# Patient Record
Sex: Male | Born: 1945
Health system: Southern US, Community
[De-identification: ages and names within clinical notes are randomized; demographics above are authoritative.]

## PROBLEM LIST (undated history)

## (undated) DIAGNOSIS — G8929 Other chronic pain: Secondary | ICD-10-CM

## (undated) DIAGNOSIS — E785 Hyperlipidemia, unspecified: Secondary | ICD-10-CM

## (undated) DIAGNOSIS — E039 Hypothyroidism, unspecified: Secondary | ICD-10-CM

## (undated) DIAGNOSIS — M545 Low back pain, unspecified: Secondary | ICD-10-CM

## (undated) DIAGNOSIS — C61 Malignant neoplasm of prostate: Secondary | ICD-10-CM

## (undated) DIAGNOSIS — G629 Polyneuropathy, unspecified: Secondary | ICD-10-CM

## (undated) DIAGNOSIS — I209 Angina pectoris, unspecified: Secondary | ICD-10-CM

## (undated) DIAGNOSIS — C679 Malignant neoplasm of bladder, unspecified: Secondary | ICD-10-CM

## (undated) DIAGNOSIS — I10 Essential (primary) hypertension: Secondary | ICD-10-CM

## (undated) DIAGNOSIS — Z8739 Personal history of other diseases of the musculoskeletal system and connective tissue: Secondary | ICD-10-CM

## (undated) DIAGNOSIS — M1990 Unspecified osteoarthritis, unspecified site: Secondary | ICD-10-CM

## (undated) DIAGNOSIS — K219 Gastro-esophageal reflux disease without esophagitis: Secondary | ICD-10-CM

## (undated) DIAGNOSIS — Z9989 Dependence on other enabling machines and devices: Secondary | ICD-10-CM

## (undated) DIAGNOSIS — D494 Neoplasm of unspecified behavior of bladder: Secondary | ICD-10-CM

## (undated) DIAGNOSIS — I2510 Atherosclerotic heart disease of native coronary artery without angina pectoris: Secondary | ICD-10-CM

## (undated) DIAGNOSIS — G4733 Obstructive sleep apnea (adult) (pediatric): Secondary | ICD-10-CM

## (undated) HISTORY — DX: Unspecified osteoarthritis, unspecified site: M19.90

## (undated) HISTORY — PX: ABDOMINAL HERNIA REPAIR: SHX539

## (undated) HISTORY — DX: Atherosclerotic heart disease of native coronary artery without angina pectoris: I25.10

## (undated) HISTORY — PX: HERNIA REPAIR: SHX51

## (undated) HISTORY — PX: CORONARY ANGIOPLASTY WITH STENT PLACEMENT: SHX49

## (undated) HISTORY — PX: ROBOT ASSISTED LAPAROSCOPIC RADICAL PROSTATECTOMY: SHX5141

## (undated) HISTORY — PX: KNEE CARTILAGE SURGERY: SHX688

## (undated) HISTORY — DX: Hypothyroidism, unspecified: E03.9

## (undated) HISTORY — DX: Hyperlipidemia, unspecified: E78.5

## (undated) HISTORY — DX: Essential (primary) hypertension: I10

## (undated) HISTORY — PX: BLADDER TUMOR EXCISION: SHX238

---

## 1995-01-12 HISTORY — PX: CORONARY ARTERY BYPASS GRAFT: SHX141

## 1998-08-12 ENCOUNTER — Encounter: Payer: Self-pay | Admitting: Emergency Medicine

## 1998-08-12 ENCOUNTER — Inpatient Hospital Stay (HOSPITAL_COMMUNITY): Admission: EM | Admit: 1998-08-12 | Discharge: 1998-08-14 | Payer: Self-pay | Admitting: Emergency Medicine

## 1998-09-16 ENCOUNTER — Inpatient Hospital Stay (HOSPITAL_COMMUNITY): Admission: AD | Admit: 1998-09-16 | Discharge: 1998-09-18 | Payer: Self-pay | Admitting: Cardiology

## 1998-09-18 ENCOUNTER — Encounter: Payer: Self-pay | Admitting: Cardiology

## 1999-01-21 ENCOUNTER — Ambulatory Visit (HOSPITAL_COMMUNITY): Admission: RE | Admit: 1999-01-21 | Discharge: 1999-01-21 | Payer: Self-pay | Admitting: Sports Medicine

## 1999-01-21 ENCOUNTER — Encounter: Payer: Self-pay | Admitting: Sports Medicine

## 2000-06-29 ENCOUNTER — Encounter: Payer: Self-pay | Admitting: Emergency Medicine

## 2000-06-29 ENCOUNTER — Inpatient Hospital Stay (HOSPITAL_COMMUNITY): Admission: EM | Admit: 2000-06-29 | Discharge: 2000-07-02 | Payer: Self-pay | Admitting: Internal Medicine

## 2000-07-19 ENCOUNTER — Encounter: Payer: Self-pay | Admitting: Cardiology

## 2000-07-19 ENCOUNTER — Inpatient Hospital Stay (HOSPITAL_COMMUNITY): Admission: EM | Admit: 2000-07-19 | Discharge: 2000-07-21 | Payer: Self-pay

## 2000-08-23 ENCOUNTER — Other Ambulatory Visit: Admission: RE | Admit: 2000-08-23 | Discharge: 2000-08-23 | Payer: Self-pay | Admitting: Urology

## 2000-08-26 ENCOUNTER — Encounter: Payer: Self-pay | Admitting: Urology

## 2000-08-26 ENCOUNTER — Ambulatory Visit (HOSPITAL_COMMUNITY): Admission: RE | Admit: 2000-08-26 | Discharge: 2000-08-26 | Payer: Self-pay | Admitting: Urology

## 2000-09-05 ENCOUNTER — Encounter: Payer: Self-pay | Admitting: Emergency Medicine

## 2000-09-05 ENCOUNTER — Inpatient Hospital Stay (HOSPITAL_COMMUNITY): Admission: EM | Admit: 2000-09-05 | Discharge: 2000-09-07 | Payer: Self-pay | Admitting: Emergency Medicine

## 2000-09-15 ENCOUNTER — Ambulatory Visit (HOSPITAL_COMMUNITY): Admission: RE | Admit: 2000-09-15 | Discharge: 2000-09-15 | Payer: Self-pay | Admitting: Urology

## 2000-09-15 ENCOUNTER — Encounter: Payer: Self-pay | Admitting: Urology

## 2000-11-08 ENCOUNTER — Encounter: Payer: Self-pay | Admitting: Emergency Medicine

## 2000-11-08 ENCOUNTER — Inpatient Hospital Stay (HOSPITAL_COMMUNITY): Admission: EM | Admit: 2000-11-08 | Discharge: 2000-11-11 | Payer: Self-pay | Admitting: Cardiology

## 2002-09-27 ENCOUNTER — Ambulatory Visit (HOSPITAL_COMMUNITY): Admission: RE | Admit: 2002-09-27 | Discharge: 2002-09-27 | Payer: Self-pay | Admitting: Internal Medicine

## 2003-10-14 ENCOUNTER — Encounter: Payer: Self-pay | Admitting: *Deleted

## 2003-10-15 ENCOUNTER — Inpatient Hospital Stay (HOSPITAL_COMMUNITY): Admission: EM | Admit: 2003-10-15 | Discharge: 2003-10-18 | Payer: Self-pay | Admitting: Cardiology

## 2004-03-12 ENCOUNTER — Observation Stay (HOSPITAL_COMMUNITY): Admission: EM | Admit: 2004-03-12 | Discharge: 2004-03-13 | Payer: Self-pay | Admitting: Emergency Medicine

## 2004-03-13 ENCOUNTER — Ambulatory Visit: Payer: Self-pay | Admitting: Cardiology

## 2005-06-22 ENCOUNTER — Ambulatory Visit: Payer: Self-pay | Admitting: Cardiology

## 2005-12-15 ENCOUNTER — Ambulatory Visit: Payer: Self-pay | Admitting: Cardiology

## 2005-12-24 ENCOUNTER — Ambulatory Visit: Payer: Self-pay

## 2006-01-17 ENCOUNTER — Ambulatory Visit: Payer: Self-pay | Admitting: Cardiology

## 2006-01-19 ENCOUNTER — Ambulatory Visit (HOSPITAL_COMMUNITY): Admission: RE | Admit: 2006-01-19 | Discharge: 2006-01-19 | Payer: Self-pay | Admitting: Urology

## 2006-01-27 ENCOUNTER — Encounter (HOSPITAL_COMMUNITY): Admission: RE | Admit: 2006-01-27 | Discharge: 2006-02-26 | Payer: Self-pay | Admitting: Orthopedic Surgery

## 2006-02-23 ENCOUNTER — Inpatient Hospital Stay (HOSPITAL_COMMUNITY): Admission: RE | Admit: 2006-02-23 | Discharge: 2006-02-25 | Payer: Self-pay | Admitting: Urology

## 2006-02-23 ENCOUNTER — Encounter (INDEPENDENT_AMBULATORY_CARE_PROVIDER_SITE_OTHER): Payer: Self-pay | Admitting: Specialist

## 2006-03-03 ENCOUNTER — Ambulatory Visit: Payer: Self-pay | Admitting: Cardiology

## 2006-06-28 ENCOUNTER — Ambulatory Visit: Payer: Self-pay | Admitting: Cardiology

## 2006-11-04 ENCOUNTER — Ambulatory Visit: Payer: Self-pay | Admitting: Cardiology

## 2006-11-04 ENCOUNTER — Observation Stay (HOSPITAL_COMMUNITY): Admission: AD | Admit: 2006-11-04 | Discharge: 2006-11-08 | Payer: Self-pay | Admitting: Cardiology

## 2006-12-01 ENCOUNTER — Ambulatory Visit: Payer: Self-pay | Admitting: Cardiology

## 2007-02-10 ENCOUNTER — Ambulatory Visit: Payer: Self-pay | Admitting: Cardiology

## 2007-09-12 ENCOUNTER — Ambulatory Visit: Payer: Self-pay | Admitting: Cardiology

## 2008-04-09 ENCOUNTER — Ambulatory Visit (HOSPITAL_COMMUNITY): Admission: RE | Admit: 2008-04-09 | Discharge: 2008-04-09 | Payer: Self-pay | Admitting: Family Medicine

## 2008-09-10 ENCOUNTER — Ambulatory Visit (HOSPITAL_COMMUNITY): Admission: RE | Admit: 2008-09-10 | Discharge: 2008-09-10 | Payer: Self-pay | Admitting: Family Medicine

## 2008-09-28 DIAGNOSIS — E785 Hyperlipidemia, unspecified: Secondary | ICD-10-CM | POA: Insufficient documentation

## 2008-09-28 DIAGNOSIS — M1990 Unspecified osteoarthritis, unspecified site: Secondary | ICD-10-CM | POA: Insufficient documentation

## 2008-09-28 DIAGNOSIS — I25810 Atherosclerosis of coronary artery bypass graft(s) without angina pectoris: Secondary | ICD-10-CM | POA: Insufficient documentation

## 2008-09-28 DIAGNOSIS — M549 Dorsalgia, unspecified: Secondary | ICD-10-CM | POA: Insufficient documentation

## 2008-09-28 DIAGNOSIS — E039 Hypothyroidism, unspecified: Secondary | ICD-10-CM | POA: Insufficient documentation

## 2008-09-28 DIAGNOSIS — I10 Essential (primary) hypertension: Secondary | ICD-10-CM | POA: Insufficient documentation

## 2008-10-17 ENCOUNTER — Ambulatory Visit: Payer: Self-pay | Admitting: Cardiology

## 2008-10-30 ENCOUNTER — Encounter (INDEPENDENT_AMBULATORY_CARE_PROVIDER_SITE_OTHER): Payer: Self-pay | Admitting: *Deleted

## 2009-10-27 ENCOUNTER — Ambulatory Visit: Payer: Self-pay | Admitting: Cardiology

## 2009-10-27 ENCOUNTER — Encounter: Payer: Self-pay | Admitting: Cardiology

## 2010-01-14 ENCOUNTER — Telehealth (INDEPENDENT_AMBULATORY_CARE_PROVIDER_SITE_OTHER): Payer: Self-pay | Admitting: *Deleted

## 2010-01-16 ENCOUNTER — Telehealth: Payer: Self-pay | Admitting: Cardiology

## 2010-02-12 NOTE — Assessment & Plan Note (Signed)
Summary: f1y   Visit Type:  Follow-up Primary Nakeysha Pasqual:  Phillips Odor  CC:  No complaints.  History of Present Illness: He seems to be doing well.  He has had chest pain one time, and it went away with NTG, but is was very feint.  He used to get this all the time, but now it is down to nothing.  Had his cholesterol check with Dr. Phillips Odor.  His arthritis is better than it was.  It has helped his knees.  BP have been a little high, and his weight is coming down.    Current Medications (verified): 1)  Norvasc 5 Mg Tabs (Amlodipine Besylate) .... Take 1/2 Tablet Daily 2)  Coq10 100 Mg Caps (Coenzyme Q10) .... Take 1 Capsule By Mouth Once A Day 3)  Celebrex 200 Mg Caps (Celecoxib) .... Take 1 Capsule By Mouth Once A Day 4)  Acetaminophen 500 Mg Tabs (Acetaminophen) .... As Needed 5)  Fish Oil 1200 Mg Caps (Omega-3 Fatty Acids) .... Take 1 Capsule By Mouth Three Times A Day 6)  Zetia 10 Mg Tabs (Ezetimibe) .... Take One Tablet By Mouth Daily. 7)  Welchol 625 Mg Tabs (Colesevelam Hcl) .... About 3 Times Per Week 8)  Plavix 75 Mg Tabs (Clopidogrel Bisulfate) .... Take One Tablet By Mouth Daily 9)  Lisinopril 5 Mg Tabs (Lisinopril) .... Take One Tablet By Mouth Daily 10)  Niaspan 500 Mg Cr-Tabs (Niacin (Antihyperlipidemic)) .... Take 1 Tablet By Mouth Once A Day 11)  Levothyroxine Sodium 25 Mcg Tabs (Levothyroxine Sodium) .... Take 1 Tablet By Mouth Once A Day 12)  Fiber Diet  Tabs (Fiber) .... At Least One A Day Sometimes Twice A Day 13)  Aspirin 81 Mg Tbec (Aspirin) .... Take One Tablet By Mouth Daily 14)  Multivitamins  Tabs (Multiple Vitamin) .... Take 1 Tablet By Mouth Once A Day 15)  Metoprolol Tartrate 25 Mg Tabs (Metoprolol Tartrate) .... Take One Tablet By Mouth Twice A Day  Allergies (verified): 1)  ! Codeine  Past History:  Past Medical History: Last updated: 2008/10/15 Current Problems:  CAD, ARTERY BYPASS GRAFT (ICD-414.04) HYPERTENSION, UNSPECIFIED (ICD-401.9) HYPERLIPIDEMIA  (ICD-272.4) HYPOTHYROIDISM (ICD-244.9) DEGENERATIVE JOINT DISEASE (ICD-715.90) BACK PAIN, CHRONIC (ICD-724.5)  Past Surgical History: Last updated: 15-Oct-2008   Coronary artery bypass grafting in 1997 with (LIMA)  arthroscopic surgery of the knees.   Hernia repair.   Radical prostatectomy with robotic device.      Family History: Last updated: 2008/10/15  Mother died at 65 of a CVA.  Father died at 71 of cancer.  He does not  know his siblings' history.   Social History: Last updated: 10-15-2008  The patient is married x2.  He has a remote tobacco   history.  He is semi-retired, working about 2 hours a day.      Vital Signs:  Patient profile:   65 year old male Height:      74 inches Weight:      229 pounds BMI:     29.51 Pulse rate:   76 / minute BP sitting:   148 / 88  (left arm) Cuff size:   large  Vitals Entered By: Hardin Negus, RMA (October 27, 2009 10:02 AM)  Physical Exam  General:  Well developed, well nourished, in no acute distress. Head:  normocephalic and atraumatic Eyes:  PERRLA/EOM intact; conjunctiva and lids normal. Neck:  Neck supple, no JVD. No masses, thyromegaly or abnormal cervical nodes. Lungs:  Clear bilaterally to auscultation and percussion. Heart:  PMI non displaced.  Normal S1 and S2.  No murmur or rub.  No gallop. Abdomen:  Bowel sounds positive; abdomen soft and non-tender without masses, organomegaly, or hernias noted. No hepatosplenomegaly. Pulses:  pulses normal in all 4 extremities Extremities:  No clubbing or cyanosis. Neurologic:  Alert and oriented x 3.   EKG  Procedure date:  10/27/2009  Findings:      NSR.  Incomplete RBBB.  Nonspecific ST abnormality.  Impression & Recommendations:  Problem # 1:  CAD, ARTERY BYPASS GRAFT (ICD-414.04) Stable at this point.  Feels great.  No chest pain of significance.  Talked about weight loss regarding metabolic standpoint, and also from a BP standpoint.   He acknowledges.  He has  improved diet and is exercising regularly. His updated medication list for this problem includes:    Norvasc 5 Mg Tabs (Amlodipine besylate) .Marland Kitchen... Take 1/2 tablet daily    Plavix 75 Mg Tabs (Clopidogrel bisulfate) .Marland Kitchen... Take one tablet by mouth daily    Lisinopril 5 Mg Tabs (Lisinopril) .Marland Kitchen... Take one tablet by mouth daily    Aspirin 81 Mg Tbec (Aspirin) .Marland Kitchen... Take one tablet by mouth daily    Metoprolol Tartrate 25 Mg Tabs (Metoprolol tartrate) .Marland Kitchen... Take one tablet by mouth twice a day  Orders: EKG w/ Interpretation (93000)  Problem # 2:  HYPERTENSION, UNSPECIFIED (ICD-401.9) Borderline control.  Weight loss should help.  If not improved, may need adjustment His updated medication list for this problem includes:    Norvasc 5 Mg Tabs (Amlodipine besylate) .Marland Kitchen... Take 1/2 tablet daily    Lisinopril 5 Mg Tabs (Lisinopril) .Marland Kitchen... Take one tablet by mouth daily    Aspirin 81 Mg Tbec (Aspirin) .Marland Kitchen... Take one tablet by mouth daily    Metoprolol Tartrate 25 Mg Tabs (Metoprolol tartrate) .Marland Kitchen... Take one tablet by mouth twice a day  Problem # 3:  HYPERLIPIDEMIA (ICD-272.4) managed by Dr. Phillips Odor. His updated medication list for this problem includes:    Zetia 10 Mg Tabs (Ezetimibe) .Marland Kitchen... Take one tablet by mouth daily.    Welchol 625 Mg Tabs (Colesevelam hcl) .Marland Kitchen... About 3 times per week    Niaspan 500 Mg Cr-tabs (Niacin (antihyperlipidemic)) .Marland Kitchen... Take 1 tablet by mouth once a day  Patient Instructions: 1)  Your physician recommends that you continue on your current medications as directed. Please refer to the Current Medication list given to you today. 2)  Your physician wants you to follow-up in:  1 YEAR.  You will receive a reminder letter in the mail two months in advance. If you don't receive a letter, please call our office to schedule the follow-up appointment.

## 2010-02-12 NOTE — Progress Notes (Signed)
Summary: rx change  Phone Note Call from Patient Call back at Home Phone [phone]   Caller: Patient Reason for Call: Talk to Nurse Summary of Call:  pt takes [facility] on tablet a day. pt needs his rx to be called in to [facility] # [phone] Initial call taken by: Roe Coombs,  January 16, 2010 2:32 PM  Follow-up for Phone Call        I spoke with the pt and he had previously taken Amlodipine 5mg  in the past but this was decreased by Dr Nobie Putnam due to hypotension.  The pt has been monitoring his BP at home and on average it is running 140/80.  The pt said he increased his Amlodipine back to 5mg  on his own.  The pt is also working on losing weight for better BP control.  The pt is now seeing Dr Phillips Odor for Primary Care. The pt would like a 3 month supply with refills sent to pharmacy. I instructed the pt to keep a BP log and if his BP continues running around 140/80 he should make an appt with Dr Phillips Odor. Pt agrees with plan.    Follow-up by: Julieta Gutting, RN, BSN,  January 16, 2010 2:58 PM  Additional Follow-up for Phone Call Additional follow up Details #1::        Per Dr Riley Kill the pt can have a Rx for Amlodipine 5mg  daily #90 with 2 refills.  I attempted to call this into the pharmacy but they are closed.  I left a message for the pt to make him aware that I will not be able to call in this Rx until Monday.  If the pt does not have any medicine for the weekend I instructed him to call our answering service.  Julieta Gutting, RN, BSN  January 16, 2010 7:06 PM  Rx for Amlodipine 5mg  called into the pharmacy.  Left message for pt to make him aware.    Additional Follow-up by: Julieta Gutting, RN, BSN,  January 19, 2010 10:35 AM    New/Updated Medications: AMLODIPINE BESYLATE 5 MG TABS (AMLODIPINE BESYLATE) Take one tablet by mouth daily

## 2010-02-12 NOTE — Progress Notes (Signed)
Summary: refill  Phone Note Refill Request Message from:  Patient on January 14, 2010 12:51 PM  Refills Requested: Medication #1:  NORVASC 5 MG TABS take 1/2 tablet daily need refill but change 5mg   and take 1 pill not a 1/2 send to [facility] [phone]  Initial call taken by: Judie Grieve,  January 14, 2010 12:52 PM  Follow-up for Phone Call        Rx called to pharmacy. Vikki Ports  January 14, 2010 3:45 PM     Prescriptions: NORVASC 5 MG TABS (AMLODIPINE BESYLATE) take 1/2 tablet daily  #45 Tablet x 6   Entered by:   Vikki Ports   Authorized by:   Ronaldo Miyamoto, MD, [facility]   Signed by:   Vikki Ports on 01/14/2010   Method used:   Faxed to ...       [facility] [address] (retail)       [address]       [place], [place]  [address]       Ph: [phone]       Fax: [phone]   RxID:   [id number]

## 2010-05-26 NOTE — Cardiovascular Report (Signed)
NAME:  [name], [name] NO.:  [id number]   MEDICAL RECORD NO.:  [id number]          PATIENT TYPE:  INP   LOCATION:  2019                         FACILITY:  [facility]   PHYSICIAN:  Steve Morton. Riley Kill, MD, FACCDATE OF BIRTH:  Jan 02, 1946   DATE OF PROCEDURE:  11/08/2006  DATE OF DISCHARGE:                            CARDIAC CATHETERIZATION   INDICATIONS:  [name] is a delightful 65 year old gentleman who  presents with some recurrent chest discomfort.  He has had prior bypass  involving a left internal mammary to the LAD and a free RIMA to the  right coronary artery.  Multiple stents have been placed in the  circumflex coronary system, the last being in 2005 at which time the  left main was crossed into the ramus intermedius.  That was treated with  a Cypher stent.  He has also got bifurcational disease more distally.  Because of the recurrent symptoms, he was brought to the catheterization  laboratory for further evaluation and treatment.   PROCEDURE:  1. Left heart catheterization.  2. Selective coronary arteriography.  3. Selective left ventriculography.   DESCRIPTION OF THE PROCEDURE:  The patient was brought to the  catheterization laboratory and prepped and draped in the usual fashion.  Through an anterior puncture the right femoral artery was easily  entered.  A 5-French sheath was then placed.  Following this, views were  obtained of the left and right coronary arteries, the free internal  mammary to the RCA, and the internal mammary to the left anterior  descending artery.  Following this, central aortic and left ventricular  pressures were measured with a pigtail.  Ventriculography was performed  in the RAO projection.  There were no complications and the patient was  taken to the holding area in satisfactory clinical condition.   HEMODYNAMIC DATA:  1. Central aortic pressure 126/69, mean 82.  2. Left ventricular pressure 124/18.  3. There was no  gradient on pullback across the aortic valve.   ANGIOGRAPHIC DATA:  1. Ventriculography done in the RAO projection reveals preserved      global systolic function.  2. The left main coronary artery tapers distally.  There is about 40-      50% area of narrowing distally as it trifurcates into an LAD, a      large intermediate, and AV circumflex.  This is at the location of      a previously placed Cypher stent which telescopes into the proximal      intermediate.  This is a 3-year result.  There is about 40-50%      narrowing and smooth narrowing.  The LAD itself is segmentally      plaqued and then basically subtotally occluded going into the      distal vessel.  The intermediate vessel has been extensively      stented.  Beyond the 40-50% area there is a long 30% area of      narrowing in the previously placed stent.  There is a second stent      distally which crosses over a bifurcation point.  At the  bifurcation there is about 40% narrowing in the main channel and      then in the side channel, which has also been dilated, there is      about 50-60% narrowing but it branches relatively small in caliber.      This also does appear to be smooth.  Just distal to the very      proximal stent in the left main, there is an AV circumflex which      also comes off.  This AV circumflex has about 50% proximal      narrowing.  There are two stents placed distally in this branch as      well.  Both stents are widely patent and there is good flow into      the small distal posterolateral system.  3. The right coronary artery is diffusely diseased proximally with 90%      narrowing and then total occlusion.  4. The internal mammary free graft to the distal right coronary artery      appears to be intact.  It goes into the distal right coronary which      has about 30% narrowing prior to the takeoff of the PDA.  The PDA      and PLA are both small in caliber but widely patent.  5. The left  internal mammary to the distal left anterior descending      artery is also widely patent with vigorous flow into the distal      LAD.  There is an area of smooth narrowing of probably 30-40%      distal to the mammary insertion and there is some mild plaquing      distally.   CONCLUSIONS:  1. Preserved left ventricular function.  2. Patent left internal mammary to the left anterior descending artery      with some mild distal disease.  3. Patent free right internal mammary artery into the right coronary      artery with mild distal disease.  4. A 40-50% narrowing at the left main site of previous Cypher      stenting location but really quite smooth with continued patency of      the multiple stents in the circumflex coronary artery.   DISPOSITION:  At the present time, the proximal lesion appears  relatively smooth.  I would be somewhat reluctant to dilate this.  It is  a 3-year result in a drug-eluting platform, and is likely not to  progress too much.  We may elect to proceed with some functional testing  and I will defer intervention unless the patient has specific symptoms.      Steve Morton. Riley Kill, MD, [facility]  Electronically Signed     TDS/MEDQ  D:  11/08/2006  T:  11/08/2006  Job:  [id number]   cc:   CV Laboratory

## 2010-05-26 NOTE — H&P (Signed)
NAME:  [name], [name] NO.:  [id number]   MEDICAL RECORD NO.:  [id number]          PATIENT TYPE:  INP   LOCATION:  2019                         FACILITY:  [facility]   PHYSICIAN:  Steve Morton. Riley Kill, MD, FACCDATE OF BIRTH:  [birth year]   DATE OF ADMISSION:  11/04/2006  DATE OF DISCHARGE:                              HISTORY & PHYSICAL   CHIEF COMPLAINT:  Chest pain.   HISTORY OF PRESENT ILLNESS:  [name] is in for a followup visit.  To briefly summarize, he has had some recurrent episodes of chest pain.  Approximately 1 week ago, he was awakened from sleep with severe  substernal chest pain; he took 3 nitroglycerins and it eventually went  away.  He has had some mild off-and-on pain ever since.  It has not been  truly progressive and he has been able to get out and exercise at times,  although it has never completely gone away as well.  He was quite  concerned and I talked with him by phone last night.  He came into the  office today for followup.  After a thorough discussion, given the  weekend coming up, we decided that it would be best to keep him under  observation with plans for cardiac catheterization and he is therefore  admitted.  The patient has had prior revascularization surgery in 1997,  and subsequently has had stenting of the circumflex coronary artery.   ALLERGIES:  Include CODEINE.   MEDICATION LIST:  1. Levothyroxine 25 mcg daily.  2. Niaspan 500 mg daily.  3. Multivitamin daily.  4. Fish oil b.i.d.  5. Plavix 75 mg daily.  6. Toprol-XL 50 mg daily.  7. Zetia 10 mg daily.  8. Welchol 625 mg two tablets.  9. Lisinopril 5 mg daily.  10.Aspirin 81 mg daily.  11.Norvasc 5 mg one-half tablet daily.  12.FiberCon every other day.  13.Glucosamine 1500 mg three a day.  14.CoQ-10 100 mg three a day.   PAST MEDICAL HISTORY:  1. Coronary bypass graft surgery in 1997 with a left internal mammary      to the LAD and a right internal mammary to the  right coronary      artery.  He has had subsequent percutaneous intervention with a      stent placement in a large intermediate branch and then subsequent      balloon dilatation for in-stent restenosis and then      atrioventricular groove cutting balloon angioplasty with repeat      intervention in 2002 and followup catheterization in 2005      demonstrating continued patency.  2. Radical prostatectomy with robotic device.  3. Degenerative joint disease.  4. Prior arthroscopic surgery of the knees.  5. Hernia repair.  6. Hyperlipidemia.   SOCIAL HISTORY:  The patient is married x2.  He has a remote tobacco  history.  He is semi-retired, working about 2 hours a day.   Mother died at 67 of a CVA.  Father died at 64 of cancer.  He does not  know his siblings' history.   REVIEW OF SYSTEMS:  He  does have a little bit of decreased hearing.  He  wears glasses.  He has had no bleeding in the urine or bowel.  His  weight has been stable.  There have been no fever or chills.  Constitutionally, he has been well without rash.  The complete review of  systems is otherwise negative.   EXAMINATION:  GENERAL:  He is alert and oriented, in no acute distress.  VITAL SIGNS:  Blood pressure was 136/82 and the pulse was 56.  NECK:  Carotid upstrokes were brisk.  HEENT:  Examination was unremarkable.  CHEST:  The lung fields were clear to auscultation and percussion.  CARDIAC:  The PMI was nondisplaced.  There is a normal first and second  heart sound without murmurs, rub or gallops.  ABDOMEN:  Soft without  obvious hepatosplenomegaly.  EXTREMITIES:  Intact.  Pulses are intact.   ELECTROCARDIOGRAM:  The electrocardiogram is done and reveals sinus  rhythm with occasional premature ventricular contraction.  There is  incomplete right bundle branch block.  Compared to the previous tracing,  no interval change is noted.   IMPRESSION:  1. Recurrent coronary symptoms worrisome for unstable angina  pectoris.  2. Prior coronary bypass graft surgery in 1997.  3. Prior percutaneous intervention of the circumflex system in      multiple locations.  4. Hyperlipidemia.  5. Other problems as noted.   PLAN:  We discussed the various options.  The patient has had some off-  and-on discomfort since his episode a week ago; based on this, we will  go ahead and admit him and plan for a repeat cardiac catheterization on  Monday; he is agreeable to this plan.  Intravenous heparin will be  started.      Steve Morton. Riley Kill, MD, [facility]  Electronically Signed     TDS/MEDQ  D:  11/04/2006  T:  11/06/2006  Job:  [id number]

## 2010-05-26 NOTE — Letter (Signed)
February 10, 2007    Patrica Duel, M.D.  [address]  [place], [place] [address]   RE:  [name], [name]  MRN:  [id number]  /  DOB:  1945-12-25   Dear Loraine Leriche:   I had the pleasure of seeing [name] in the office today in a  followup visit.  Clinically, he is doing reasonably well.  He has been  in the gym and working on the elliptical without any chest pain.  He has  had some tingling in the toes on the right, and as you know, he has had  some discomfort in the foot thought to be related to tendonitis for  which he has been on Celebrex.  Otherwise, cardiac-wise, he seems to be  getting along well.  I know you have been managing his lipids.   Today on physical, blood pressure of 144/76, pulse 58.  The lung fields  are clear to auscultation and percussion.  The PMI is nondisplaced.  The  cardiac rhythm is regular.  No significant murmurs are noted.   Electrocardiogram demonstrates normal sinus rhythm and essentially  reveals an incomplete right bundle branch block.   Clinically, he seems to be getting along well from a cardiac standpoint.  He has got excellent pulses in the feet and good capillary refill.  I am  not quite sure what causes the tingling in the toes, perhaps it is  related to the tendonitis.  As you know, he recently underwent cardiac  catheterization demonstrating preserved left ventricular function with  patent internal mammary to the LAD, a patent free RIMA to the right  coronary artery with mild distal disease and a 40-50% narrowing of the  left main site of the previous Cypher stent, but with smooth appearance  and good flow.   We plan to see him back in followup in about six months' time.  Hopefully, he will continue to do well.  Do not ever hesitate to call us  regarding his management.  I would be more than happy to see him at any  time.    Sincerely,      [name]. Steve Kill, MD, [facility]  Electronically Signed    TDS/MedQ  DD:  02/10/2007  DT: 02/10/2007  Job #: [id number]

## 2010-05-26 NOTE — Letter (Signed)
September 12, 2007    Patrica Duel, MD  [address]  [place], [place] [address].   RE:  RICHAR, DUNKLEE  MRN:  [id number]  /  DOB:  1945/10/06   Dear Loraine Leriche,   I had the pleasure of seeing Mr. Degraaf in the office today in  followup visit.  Cardiacwise, he seems to be getting along reasonably  well.  He denies any ongoing chest pain or progressive shortness of  breath.  He was having some leg cramps.  He says the cyclobenzaprine,  which we gave him has helped him dramatically.   CURRENT MEDICATIONS:  1. Norvasc 5 mg one half tablet daily.  2. CQ-10 100 mg b.i.d.  3. Celebrex generic 200 mg daily.  4. Metoprolol 25 mg p.o. b.i.d.  5. TriCor 145 mg daily.  6. Cyclobenzaprine 10 mg daily.  7. Glucosamine 1500 mg b.i.d.  8. Chondroitin 1200 mg b.i.d.  9. Fish oil 850 mg daily.   PHYSICAL EXAMINATION:  GENERAL:  He is alert and oriented in no  distress.  VITAL SIGNS:  Blood pressure 134/86, pulse is 58.  NECK:  No carotid bruits.  LUNG:  Fields are clear to auscultation and percussion.  CARDIAC:  PMI is nondisplaced.  There is a normal first and second heart  sound.  EXTREMITIES:  Reveal no edema.   The electrocardiogram demonstrates sinus bradycardia, otherwise  unremarkable.   Overall, he appears to be stable.  We will plan to see him back in  followup in a year.  I would refer any laboratory testing to your  office.  I appreciate the opportunity of sharing in his care.  Please do  not hesitate to call, if he would develop any problems in the interim.    Sincerely,      [name]. Riley Kill, MD, [facility]  Electronically Signed    TDS/MedQ  DD: 09/12/2007  DT: 09/13/2007  Job #: [id number]

## 2010-05-26 NOTE — Discharge Summary (Signed)
NAME:  [name], [name] NO.:  [id number]   MEDICAL RECORD NO.:  [id number]          PATIENT TYPE:  INP   LOCATION:  2019                         FACILITY:  [facility]   PHYSICIAN:  [name]. Riley Kill, MD, FACCDATE OF BIRTH:  05-19-1945   DATE OF ADMISSION:  11/04/2006  DATE OF DISCHARGE:  11/08/2006                               DISCHARGE SUMMARY   PRIMARY CARDIOLOGIST:  Maisie Fus D. Riley Kill, MD, [facility]   PRIMARY CARE PHYSICIAN:  Is not listed.   PROCEDURES PERFORMED DURING HOSPITALIZATION:  Cardiac catheterization on  November 08, 2006 per [name]. Riley Kill, MD, [facility] with patent (LIMA) left  internal mammary artery to (LAD) left anterior descending, patent free  (RIMA) right internal mammary artery to (RCA) right coronary artery,  complex circumflex with multiple stents, which is smooth, 50% (LAD) left  internal mammary artery stent three years out, probable medical  treatment with functional outpatient study recommended.   FINAL DISCHARGE DIAGNOSES:  1. Chest pain in patient with known coronary artery disease.      a.     Coronary artery bypass grafting in 1997 with (LIMA) left       internal mammary artery to (LAD) left anterior descending, right       internal mammary to the right coronary artery.  Subsequent       percutaneous intervention with stent placement in a large       intermediate branch and then subsequent balloon dilatation for       instant restenosis, then atrioventricular groove cutting balloon       angioplasty with repeat intervention in 2002 and followup       catheterization in 2005 demonstrating continued patency.  2. Hyperlipidemia.  3. Hypothyroidism.   SECONDARY DIAGNOSES:  1. Degenerative joint disease.  2. Prior arthroscopic surgery of the knees.  3. Hernia repair.  4. Radical prostatectomy with robotic device.   HOSPITAL COURSE:  This is a 65 year old Caucasian male who presented to  Dr. Rosalyn Charters office approximately one week prior to  admission with  recurrent episodes of chest pain.  He was awakened from sleep with  severe substernal chest pain, took three nitroglycerins and eventually  went away.  He had mild on and off pain ever since.  The pain has not  been progressive and he has been able to get out and exercise, but it is  never completely gone away as well.  The patient was in contact with Dr.  Riley Kill by phone prior to admission and he came into the office the  following day for followup and given the patient's symptoms, he was  admitted for plans for cardiac catheterization.   The patient was monitored for two days prior to admission for evaluation  for ongoing ischemia with cardiac enzymes cycled down to be negative.  The patient's blood pressure was well controlled over the weekend and  followed by Dr. Antoine Poche.  Heparin was started over the weekend as well  and monitored by pharmacy to evaluate lab studies and need for  adjustment.  The patient's blood pressure was well controlled over the  weekend on current  medications.  The patient had the catheterization  today on November 08, 2006 with results as described above with patent  LIMA to LAD, patent free RIMA to right coronary artery, complex  circumflex with multiple stents, which were found to be smooth, 50% LIMA  stent that is three years old.  Medical therapy was recommended by Dr.  Riley Kill and followup appointment has been made.   The patient had been up walking around in the hall without any further  chest discomfort.  The patient had no complaints of chest pain.  The  right groin site was found to be fine without evidence of bleeding,  hematoma or infection at this time.   DISCHARGE LABS:  Hemoglobin 12.1, hematocrit 33.9, white blood cells  4.5, platelets 190.  Sodium 135, potassium 3.9, chloride 99, CO2 26, BUN  14, creatinine 0.98, glucose 138, AST 26, ALT 31, alkaline phosphatase  53, total bilirubin 0.6, albumin 4.3.  Troponins were found to  be  negative at 0.02, 0.02 and 0.02 respectively.  Calcium was 9.0.   VITAL SIGNS ON DISCHARGE:  Blood pressure 111/68, heart rate 56,  respirations 18, temperature 97 with an O2 sat of 95% on room air.   DISCHARGE MEDICATIONS:  1. Levothyroxine 25 mcg daily.  2. Niaspan 500 mg daily.  3. Multivitamin daily.  4. Fish oil twice a day.  5. Plavix 75 mg daily.  6. Toprol-XL 50 mg daily.  7. Zetia 10 mg daily.  8. Welchol 625 mg two tablets as needed.  9. Lisinopril 5 mg daily.  10.Aspirin 81 mg daily.  11.Norvasc 5 mg 1/2 tablet daily.  12.Fibercon every other day.  13.Glucosamine 1500 mg three a day.  14.CoQ10 100 mg three times a day.   ALLERGIES:  CODEINE AND SULFA.   FOLLOWUP PLANS AND APPOINTMENTS:  1. The patient will see Dr. Riley Kill on December 01, 2006 at 4 p.m. for      continued cardiac management and followup.  2. The patient is to follow up with his primary care physician for      continued medical management.  3. The patient has been given post cardiac catheterization      instructions with emphasis on the right groin site for complaints      of hematoma, bleeding, infection or severe pain.   Time spent with the patient to include physician time is 45 minutes.      Bettey Mare. Lyman Bishop, NP      [name]. Riley Kill, MD, [facility]  Electronically Signed    KML/MEDQ  D:  11/08/2006  T:  11/09/2006  Job:  [id number]

## 2010-05-26 NOTE — Assessment & Plan Note (Signed)
[facility]                            CARDIOLOGY OFFICE NOTE   AJAY, STRUBEL                   MRN:          [id number]  DATE:12/01/2006                            DOB:          1945-09-30    Mr. Blitzer is in for followup.  He has remained stable.  He has  continued to have a little bit of mild discomfort.  He and I reviewed  his films in great detail today.  To basically summarize, there was some  mild re-narrowing of the left main stent, but quite honestly, the stent  looked reasonably good.  We talked about repeat dilatation of this, but  the patient said he is feeling, really, pretty well, and would like to  hold off on doing anything about that.  We talked about the possible  options.  As noted, this appears to be about 40% to 50%, and it is in  the mid portion of a previously placed Cypher stent represented a 3-year  result.  As a result, he is somewhat reluctant to change.  He says as  long as his symptoms are similar to what they are right now, he would  like not to make a change.   CURRENT MEDICATIONS:  1. Norvasc 5 mg 1/2 tablet daily.  2. Fiber every other day.  3. Glucosamine daily.  4. CQ-10.  5. Naprosyn which he takes.  6. Levothyroxine 25 mg daily.  7. Niaspan 500 mg daily.  8. Multivitamin daily.  9. Fish oil b.i.d.  10.Plavix 75 mg daily.  11.Toprol 50 mg daily.  12.Zetia 10 mg daily.  13.Welchol 625 mg 2 in the p.m.  14.Lisinopril 5 mg daily.  15.Aspirin 81 mg daily.   PHYSICAL EXAMINATION:  He is alert and oriented.  Blood pressure is 120/80, pulse is 67.  The lung fields are clear.  The cardiac rhythm is regular.  The groin site looks good.   EKG reveals normal sinus rhythm with incomplete right bundle branch  block and nonspecific T-wave abnormality.   Overall, the patient remains stable.  He has not been having any ongoing  chest pain.  Clinical continued medical therapy would be warranted at  the  present time.  We will continue to follow him closely, and should  there by any change in his overall status, then we will recommend a  consideration of a percutaneous intervention in this particular vessel.  Otherwise, we will see him back in followup in about 2 months.     [name]. Riley Kill, MD, [facility]  Electronically Signed    TDS/MedQ  DD: 12/02/2006  DT: 12/03/2006  Job #: [id number]   cc:   Patrica Duel, M.D.

## 2010-05-26 NOTE — Assessment & Plan Note (Signed)
[facility]                            CARDIOLOGY OFFICE NOTE   [name], [name]                   MRN:          [id number]  DATE:06/28/2006                            DOB:          1945/10/08    [name] is in for a followup visit.  In general, he has been  relatively stable.  He has not been having any ongoing chest pain.  He  was last seen in February of this year.  He is status post radical  prostatectomy for cancer of the prostate.   MEDICATIONS INCLUDE:  1. Levothyroxine 25 micrograms daily.  2. Niaspan 500 mg daily.  3. Multivitamin daily.  4. Glucosamine daily.  5. Fish oil b.i.d.  6. Norvasc 5 mg daily.  7. Plavix 75 mg daily.  8. Toprol 50 mg daily.  9. Celebrex 200 mg daily.  10.Zetia 10 mg daily.  11.Welchol 625 mg two tablets daily.  12.Lisinopril 5 mg daily.  13.Aspirin 81 mg daily.   Today, the blood pressure is 130/64, the pulse is 56, the exam is  otherwise unchanged.   EKG reveals normal sinus rhythm with occasional premature ventricular  contraction.  There are no STT-wave abnormalities.   IMPRESSION:  1. Coronary artery disease with prior coronary artery bypass graft      surgery and subsequent stenting of the circumflex.  2. Hypercholesterolemia, on lipid-lowering therapy.  3. Hypertension.   PLAN:  Return to clinic in six months.  Continue current medical  regimen.     [name]. Riley Kill, MD, [facility]  Electronically Signed    TDS/MedQ  DD: 11/03/2006  DT: 11/04/2006  Job #: [id number]

## 2010-05-29 NOTE — Assessment & Plan Note (Signed)
[facility]                            CARDIOLOGY OFFICE NOTE   [name], [name]                   MRN:          [id number]  DATE:03/03/2006                            DOB:          1945/10/17    [name] is in for followup.  He has undergone successful robotic  radical prostatectomy with bilateral pelvic lymphadenopathy for  adenocarcinoma of the prostate.  He has not had any chest pain and has  tolerated things well.  He came off of his Plavix 5 days prior to  surgery, but has remained on aspirin therapy throughout.  He has not had  any recurrent symptoms.  He is slowly and gradually improving, and not  currently having any kind of discomfort.   CURRENT MEDICATIONS:  1. Aspirin 81 mg daily.  2. Metamucil.  3. Lisinopril 5 mg daily.  4. Welchol 725 mg two tablets daily.  5. Zetia 10 mg daily.  6. Celebrex 200 mg daily.  7. Toprol 50 mg daily.  8. Plavix 75 mg daily, which has not been restarted yet.  9. Norvasc 5 mg daily.  10.Fish oil b.i.d.  11.Glucosamine daily.  12.Multivitamin daily.  13.Niaspan 500 mg daily.  14.Levothyroxine 25 mcg daily.   PHYSICAL EXAMINATION:  The blood pressure is 135/70, the pulse is 62.  LUNGS:  The lung fields are clear.  CARDIAC:  The cardiac rhythm is regular without a significant murmur,  rub or gallop.  EXTREMITIES:  Showed no edema.   The electrocardiogram demonstrates normal sinus rhythm with a rare  premature ventricular contraction.   IMPRESSION:  1. Coronary artery disease, status post percutaneous coronary      intervention and prior revascularization surgery.  2. Prior stenting with drug-eluting platform.  3. Recent radical prostatectomy for adenocarcinoma of the prostate.   PLAN:  1. Return to clinic in 4 months.  2. Resume Plavix on Sunday.     [name]. Riley Kill, MD, [facility]  Electronically Signed    TDS/MedQ  DD: 03/03/2006  DT: 03/04/2006  Job #: [id number]   cc:   Patrica Duel, M.D.  Ronald L. Earlene Plater, M.D.

## 2010-05-29 NOTE — Discharge Summary (Signed)
[place]  Patient:    [name], [name]                   MRN: [id number] Adm. Date:  [id number] Disc. Date: [id number] Attending:  Lewayne Bunting Dictator:   Abelino Derrick, P.A.C. [facility] CC:         Jonell Cluck, M.D., [address],  [place], [place] [address]  [name]. Riley Kill, M.D. [facility]   Referring Physician Discharge [place]  DISCHARGE DIAGNOSES: 1. Coronary disease, ramus intermedius percutaneous coronary intervention this    admission secondary to stent restenosis and side branch percutaneous    coronary intervention this admission. 2. Coronary artery bypass grafting in 1997 x 2. 3. Ramus intermedius stenting in August of 2000. 4. Hypertension which is controlled. 5. Treated hyperlipidemia.  HOSPITAL COURSE:  The patient is a 65 year old male with a history of coronary disease as described above.  He was admitted by Doylene Canning. Ladona Ridgel, M.D., on June 29, 2000, with chest pain consistent with unstable angina.  Enzymes were negative.  He was set for cardiac catheterization, which was done on July 01, 2000, by Daisey Must, M.D.  This revealed a patent free RIMA to RCA and a patent LIMA to LAD.  The ramus intermedius had two stents with a 75% narrowing of the distal stent and a 95% side branch occlusion just distal to the stent. Both of these lesions were intervened on by Daisey Must, M.D.  Ultimately the ramus intermedius was dilated from 75% to less than 20% with TIMI-3 flow and the side branches from 95% to 40% with TIMI-3 flow.  He was put on Integrilin for 18 hours post procedure.  Daisey Must, M.D., has recommended Plavix for four weeks.  The patient was transferred to the floor and ambulated.  Stable for discharge on July 02, 2000.  DISCHARGE MEDICATIONS: 1. Toprol XL 50 mg a day. 2. Aspirin q.d. 3. Vioxx 25 mg a day. 4. Imdur 30 mg a day. 5. Norvasc 5 mg a day. 6. Lipitor 10 mg a day. 7. Plavix 75 mg a day for four  weeks.  LABORATORY DATA:  White count 5.4, hemoglobin 13.4, hematocrit 38, platelets 156.  Sodium 142, potassium 3.9, BUN 13, creatinine 1.0.  CK-MBs and troponins were negative.  The EKG showed sinus rhythm with incomplete right bundle branch block.  DISPOSITION:  The patient is discharged in stable condition and will follow up with the office P.A. next week and then follow up with [name]. Riley Kill, M.D., in a few weeks. DD:  07/02/00 TD:  07/03/00 Job: [id number]

## 2010-05-29 NOTE — Op Note (Signed)
   NAME:  [name], [name]                      ACCOUNT NO.:  [id number]   MEDICAL RECORD NO.:  [id number]                   PATIENT TYPE:  AMB   LOCATION:  DAY                                  FACILITY:  [facility]   PHYSICIAN:  Lionel December, M.D.                 DATE OF BIRTH:  Mar 07, 1945   DATE OF PROCEDURE:  09/27/2002  DATE OF DISCHARGE:                                 OPERATIVE REPORT   PROCEDURE:  Total colonoscopy.   INDICATIONS FOR PROCEDURE:  Mr. Borawski is a 65 year old Caucasian male who  is undergoing colonoscopy for screening purposes.  The procedure and risks  were reviewed with the patient, and informed consent was obtained.   PREOPERATIVE MEDICATIONS:  Demerol 50 mg IV, Versed 5 mg IV in divided  doses.   FINDINGS:  The procedure was performed in the endoscopy suite.  The  patient's vital signs and O2 saturations were monitored during the procedure  and remained stable.  The patient was placed in the left lateral recumbent  position and rectal examination performed.  No abnormality noted on external  or digital exam.  The Olympus videoscope was placed into the rectum and  advanced into the region of the sigmoid colon and beyond.  The preparation  was excellent.  Scattered diverticula were noted at the descending and  sigmoid colon.  The scope was passed to the cecum which was identified by  the appendiceal orifice and ileocecal valve.  Pictures were taken for the  record.  As the scope was withdrawn, the colonic mucosa was once again  carefully examined, and there were no polyps and/or other abnormalities.  The rectal mucosa was normal.  The scope was retroflexed to examine the  anorectal junction, and there was one prominent and one small hemorrhoid  below the dentate line.  The endoscope was straightened and withdrawn.  The  patient tolerated the procedure well.   FINAL DIAGNOSIS:  Small external hemorrhoids and left colonic  diverticulosis.  Otherwise, normal  colonoscopy.    RECOMMENDATIONS:  1. He will resume his usual medications, including ASA.  2. High fiber diet and Citrucel one tablespoon full daily.  3. He should continue yearly Hemoccult and consider next screening exam in     10 years from now.                                               Lionel December, M.D.    NR/MEDQ  D:  09/27/2002  T:  09/27/2002  Job:  [id number]   cc:   Patrica Duel, M.D.  [address], [address]  [place]  [place] [address]  Fax: [phone]   [name]. Riley Kill, M.D.

## 2010-05-29 NOTE — Cardiovascular Report (Signed)
NAME:  [name], [name] NO.:  [id number]   MEDICAL RECORD NO.:  [id number]          PATIENT TYPE:  INP   LOCATION:  2908                         FACILITY:  [facility]   PHYSICIAN:  Jonelle Sidle, M.D. LHCDATE OF BIRTH:  07-30-1945   DATE OF PROCEDURE:  10/15/2003  DATE OF DISCHARGE:                              CARDIAC CATHETERIZATION   PRIMARY CARDIOLOGIST:  [name]. Riley Kill, M.D. [facility]   PRIMARY CARE PHYSICIAN:  Patrica Duel, M.D.   INDICATION:  [name] is a pleasant 65 year old male with known coronary  artery disease status post previous coronary artery bypass grafting  including a LIMA to the left anterior descending and a free RIMA to the  posterior descending branch of the right coronary artery.  Subsequently, he  has undergone stent placement to a large ramus intermedius branch as well as  balloon angioplasty to the bifurcation of the large ramus intermedius branch  as well as proximal circumflex.  Last intervention was in October 2002, and  since that time the patient has been stable on medical therapy.  He presents  now following an episode of left jaw discomfort and shortness of breath, and  at this point has ruled out for myocardial infarction.  He is referred for  coronary angiography to reassess the status of his atherosclerosis.   PROCEDURES PERFORMED:  1.  Left heart catheterization.  2.  Selective coronary angiography.  3.  Left ventriculography.  4.  Bypass graft angiography.   ACCESS AND EQUIPMENT:  The area about the right femoral artery was  anesthetized with 1% lidocaine and a 6-French sheath was placed in the right  femoral artery via the modified Seldinger technique.  Standard preformed 6-  [place] JL-4 and JR-4 catheters were used for selective coronary angiography  and an angled pigtail was used for left heart catheterization and left  ventriculography.  The JR-4 catheter was used for selective bypass graft  angiography including  the free RIMA and the LIMA.  All exchanges were made  over a wire and the patient tolerated the procedure well without immediate  complications.   HEMODYNAMICS:  1.  Left ventricle 100/11 mmHg.  2.  Aorta 100/70 mmHg.   ANGIOGRAPHIC FINDINGS:  1.  Left main coronary artery has a shelf-like 50-60% distal stenosis that      looks more significant compared to previous films from 2002.  2.  The left anterior descending is occluded proximally.  The distal vessel      is diffusely diseased with a 40% mid vessel stenosis and 30% more distal      stenosis.  This fills via a patent left internal mammary artery graft.  3.  The circumflex coronary artery is a medium caliber vessel with two      obtuse marginal branches.  There is approximately 30% stenosis in the      proximal vessel with other minor luminal irregularities.  4.  There is a very large bifurcating ramus intermedius branch.  There is a      proximal stent that is patent with approximately 20% in-stent      restenosis.  At the bifurcation, there are 40 and 50% stenoses in the      lower and upper arms respectively at their ostia.  Flow is TIMI-3 within      this system.  5.  The right coronary artery is a dominant vessel that is diffusely and      severely diseased proximally with occlusion at the mid vessel segment.      The distal vessel and posterior descending branch fill via a patent free      right internal mammary artery graft.   Left ventriculography was performed in the RAO projection revealing an  ejection fraction of approximately 65% with no significant mitral  regurgitation.   DIAGNOSES:  1.  Multivessel coronary artery disease as outlined with evidence of      progression of disease in the distal left main.  The left internal      mammary artery and free right internal mammary artery grafts are patent.      The proximal circumflex and bifurcation of the ramus intermedius branch      look to be fairly stable.  2.   Left ventricular ejection fraction of approximately 65% with no      significant mitral regurgitation.   RECOMMENDATIONS:  I have asked Dr. Riley Kill to review the films, particularly  in terms of the left main as the remainder of the patient's anatomy looks  fairly stable.  Can make further plans at that time.       SGM/MEDQ  D:  10/15/2003  T:  10/15/2003  Job:  [id number]

## 2010-05-29 NOTE — H&P (Signed)
NAME:  [name], [name] NO.:  [id number]   MEDICAL RECORD NO.:  [id number]          PATIENT TYPE:  INP   LOCATION:  A210                          FACILITY:  [facility]   PHYSICIAN:  Kirk Ruths, M.D.DATE OF BIRTH:  01-06-1946   DATE OF ADMISSION:  03/12/2004  DATE OF DISCHARGE:  LH                                HISTORY & PHYSICAL   CHIEF COMPLAINT:  Chest pain.   PRESENTING ILLNESS:  This is a 65 year old male who had a history of  coronary artery bypass graft several years ago as well as stent placement  and hypertension.  On the morning of admission the patient awoke with some  nausea, and through the day had some mild episodes of diarrhea.  The nausea  continued and he began somewhat fullness in his epigastric area and some  pain in his right jaw and some diaphoresis and shortness of breath.  The  patient was seen in the emergency room, given nitroglycerin x2 and  subsequently vomited with sudden relief of his symptoms.  The patient's  cardiac enzymes as well as EKG were basically normal, but with his history  and past medical history as well as current problems he was admitted for  rule out MI.  He is probably having gastroenteritis which is rampant around  town at this time.   ALLERGIES:  He is allergic to CODEINE.   CURRENT MEDICATIONS:  1.  He currently is on Vytorin 20/40 one-half daily.  2.  Plavix 75 mg daily.  3.  Toprol 50 mg daily.  4.  Norvasc 5 mg daily.  5.  Altace 2.5 daily.  6.  Enteric coated aspirin 325 daily.  7.  Levothyroxine 25 mcg daily (incidentally the patient states that he has      been out of this medication for 2 weeks).  8.  He is on some vitamins and fish oil, and Metamucil, also daily.  9.  He takes nortriptyline 25-50 mg nightly for chronic back pain.   REVIEW OF SYSTEMS:  Denies cough or significant abdominal pain.   SOCIAL HISTORY:  Denies drug use, alcohol, and is a nonsmoker.   PHYSICAL EXAMINATION:  GENERAL:  A  middle-aged male who appears slightly  pale.  VITAL SIGNS:  His temperature is 99.6, pulse is 90 and regular, blood  pressure is 120/70.  HEENT:  TMs are normal. Pupils are equal, round, and reacted to light and  accommodation.  Oropharynx is benign.  NECK:  Supple without JVD, bruits or thyromegaly.  LUNGS:  Clear in all areas.  HEART:  With a regular sinus rhythm without murmur, gallop, or rub.  ABDOMEN:  Soft and nontender.  EXTREMITIES:  Without clubbing, cyanosis, or edema.  NEUROLOGIC EXAMINATION:  Grossly intact.   ASSESSMENT:  1.  Acute gastroenteritis.  2.  Chest pain, rule out myocardial infarction.  3.  History of coronary artery bypass graft.  4.  History of hypertension.  5.  History of chronic back pain.      WMM/MEDQ  D:  03/12/2004  T:  03/12/2004  Job:  [id number]

## 2010-05-29 NOTE — Consult Note (Signed)
NAME:  [name] NO.:  [id number]   MEDICAL RECORD NO.:  [id number]          PATIENT TYPE:  INP   LOCATION:  A210                          FACILITY:  [facility]   PHYSICIAN:  [name], M.D.  DATE OF BIRTH:  03-23-1945   DATE OF CONSULTATION:  03/13/2004  DATE OF DISCHARGE:  03/13/2004                                   CONSULTATION   REFERRING PHYSICIAN:  Kirk Mack, M.D.   PRIMARY CARDIOLOGIST:  [name], M.D.   HISTORY OF PRESENT ILLNESS:  A 65 year old gentleman with known coronary  disease having undergone CABG surgery in 1997, and multiple subsequent  percutaneous interventions, referred for evaluation of chest discomfort.  [name] has done well since a left main percutaneous intervention in  October 2005.  A stress Cardiolite performed a month ago was negative for  ischemia.  He awoke the day of admission with chills, weakness, and mild  diaphoresis.  He subsequently developed nausea.  He was awaiting evaluation  in his primary physician's office when he sensed mild upper substernal chest  tightness and some unusual sensation in his jaws.  There was slight  associated dyspnea.  These symptoms are not identical to those he  experienced before his previous coronary intervention.  He was given  sublingual nitroglycerin with a variable response.  He subsequently  experienced two episodes of emesis.  Since admission to the hospital, he has  felt well.   MEDICATIONS PRIOR TO ADMISSION:  1.  Vytorin 10/20 mg daily.  2.  Clopidogrel 75 mg daily.  3.  Metoprolol 50 mg daily.  4.  Amlodipine 5 mg daily.  5.  Ramipril 2.5 mg daily.  6.  Aspirin 325 mg daily.  7.  Levothyroxine 0.25 mg daily.  8.  Nortriptyline 25 mg q.h.s.  9.  Fish oil one daily.  10. Vitamins.  11. Metamucil.   ALLERGIES:  1.  CODEINE.  2.  SULFA DRUGS.   PAST MEDICAL HISTORY:  1.  Hypertension that has been well controlled.  2.  Hyperlipidemia.  3.   Arthritis.  4.  Bilateral knee surgery.  5.  Hernia repair.   SOCIAL HISTORY:  Married and lives in [place], self-employed in rental  real estate.  Remote history of tobacco use, none for the past 30 years.  Modest alcohol use.  Walks five days per week.   FAMILY HISTORY:  Mother suffered a fatal CVA at age 12.  Father died at age  28 due to neoplastic disease.  One brother has coronary disease in his 46s.   REVIEW OF SYSTEMS:  Notable for intermittent back pain with arthralgias in  other regions;  GERD symptoms.  Other systems reviewed and are negative.   PHYSICAL EXAMINATION:  GENERAL:  Pleasant gentleman in no acute distress.  VITAL SIGNS:  Temperature 98.5, heart rate 60 and regular, respirations 20,  blood pressure 110/65, weight 210.  HEENT:  Nonicteric sclerae;  normal lids and conjunctivae.  NECK:  No bruits;  no jugular venous distention.  HEMATOPOIETIC:  No lymphadenopathy.  CARDIAC:  Normal first and second heart sounds;  no  significant murmur nor  gallop.  LUNGS:  Clear.  ABDOMEN:  Soft and nontender;  no organomegaly;  no masses.  SKIN:  No significant lesions.  NEUROMUSCULAR:  Normal cranial nerves, normal strength and tone.  MUSCULOSKELETAL:  No joint deformity.  EXTREMITIES:  Distal pulses intact.   LABORATORY DATA:  Chest x-ray:  No acute abnormality.  EKG:  Sinus rhythm at  a rate of 90;  shallow lateral T-wave inversions similar to a prior tracing  of April 2005.   Other laboratory notable for a normal CBC, normal chemistry profile, normal  hepatic profile, and normal initial markers.   IMPRESSION:  [name] presents with very mild symptoms that are  compatible with myocardial ischemia, but unlike his previous episodes.  His  principal illness clearly is gastrointestinal in origin.  In the absence of  EKG abnormalities or abnormalities in cardiac markers, and with a negative  recent stress test, I do not believe that further in-hospital evaluation  is  necessary.  Since his prior stress test was only a treadmill examination,  and he has an abnormal baseline EKG, a stress nuclear study is warranted and  will be obtained electively on an outpatient basis.  We greatly appreciate  the request for consultation and concur with your decision for early  discharge.      RR/MEDQ  D:  03/15/2004  T:  03/15/2004  Job:  [id number]

## 2010-05-29 NOTE — Cardiovascular Report (Signed)
[place]  Patient:    [name], [name] Visit Number: [id number] MRN: [id number]          Service Type: MED Location: [phone] Attending Physician:  Talitha Givens Dictated by:   [name] Riley Kill, M.D. [facility] Proc. Date: 11/10/00 Admit Date:  11/08/2000   CC:         Jonell Cluck, M.D.  CV Laboratory   Cardiac Catheterization  INDICATIONS: The patient is a pleasant 65 year old, who was well known to use. He has had prior revascularization surgery and then developed significant disease of the circumflex. The AV circumflex has been stented as has a bifurcation lesion and a large ramus intermedius. He has had re-stenosis and subsequent treatment with a Cutting Balloon intervention of both branches followed by kissing balloon because of elastic recoil at the untreated limb. He has done well but now developed recurrent angina and brought back to the lab for further treatment.  PROCEDURES: 1. Left heart catheterization. 2. Selective coronary arteriography. 3. Selective left ventriculography. 4. Percutaneous transluminal coronary angioplasty of the circumflex coronary    artery.  DESCRIPTION OF PROCEDURE: The patient was brought to the catheterization lab and prepped and draped in the usual fashion. Through an anterior puncture the right femoral artery was entered. A 6 French sheath was used. Diagnostic images were obtained. Following this I had Dr. Gerri Spore come to the lab and he reviewed the study with [place]. The patient was adequately heparinized with Integrilin running. He had been on Plavix. I discussed the options with the patients wife. The options at this point include repeat surgery with a vein to the circumflex branches or attempt at repeat dilatation or continued medical therapy. We elected to try to redilate the circumflex bifurcation as well as the AV circumflex. The patient was well informed about the  whole complexity of his anatomy. He was agreeable to proceed.  With heparinization being adequate with ACT in excess of 200 seconds and Integrilin running, we initially used a JL4.5 guide. Two wires were placed, on in the superior branch and one in the inferior branch of the ramus intermedius. We then placed a 3.0 Quantum x 12 mm in the inferior branch and a 2.5 x 15 Maverick in the superior branch and attempted to do kissing balloon angioplasty. We were ultimately able to keep both of these up.  We had some elastic recoil in the superior branch and so this had to be redilated on several occasions. We ended up dilating with a 2.25 PowerSail after attention was then turned to the AV circumflex. To gain access to the AV circumflex we used a Voda guide which was 4.0 and used a BMW wire which was fashioned into the AV circumflex. This was dilated using a 2.25 x 13 PowerSail balloon. There was marked improvement in the appearance of this vessel. We then went back down the superior branch of the bifurcating ramus and redilated that area. At this point, there was about 50% narrowing involving the superior branch, 30% narrowing involving the inferior branch and less than 20% narrowing involving the previous stent in the AV circumflex and we decided to stop at this point in time. Overall, the patient tolerated the procedure well without major complications.  HEMODYNAMICS: 1. Central aorta 126/78. 2. LV 121/9 with an LVEDP of 18.  ANGIOGRAPHIC DATA: The left main coronary artery was free of critical disease.  The LAD is somewhat diffusely diseased and then subtotally occluded after two  diagonals. One diagonal has about 50% proximal narrowing and some mid disease as well. The LAD in the midportion is subtotally occluded with competitive flow distally.  The internal mammary artery to the distal LAD is widely patent with some diffuse luminal irregularities of the LAD proper.  The ramus  intermedius has extensive stenting. The midportion of the ramus intermedius has a long stent with no evidence of re-stenosis. The most distal aspect of the stent that crosses the superior sub-branch which itself has 90% ostial narrowing. This was reduced to about 50% ostial narrowing after the balloon dilatation. The 70% narrowing in the AV portion of the vessel was reduced to about 30%. The AV circumflex itself is relatively small and not suitable for grafting. There is about 70% narrowing within the stented site and this is reduced to about 30%. The other stent in a small sub-branch is hard to grade because of the very small size and has about 30% narrowing.  The right coronary artery has diffuse disease with 70% and then long 90% lesion. The free RIMA to the distal right coronary is intact with about a 40-50% area of narrowing distal to the insertion prior to the PDA. This will need to be watched and may eventually become significant.  LEFT VENTRICULOGRAPHY: Ventriculography in the RAO reveals preserved global systolic function without segmental wall motion abnormality.  Ejection fraction was estimated at 55%.  CONCLUSIONS: 1. Status post percutaneous intervention of the ramus intermedius with    superior and inferior sub-branches with repeat dilatation. 2. Repeat dilatation of the arteriovenous circumflex. 3. Continued patency of the internal mammary to the left anterior descending. 4. Continued patency of the right internal mammary artery to the distal right    with some distal disease beyond the graft insertion.  DISPOSITION: The patient will be treated medically. Our goal is to try to avoid re-do surgery. This may eventually be required, but the patient has had both mammaries used, and re-do surgery would carry at least moderate risk. Dictated by:   [name] Riley Kill, M.D. [facility] Attending Physician:  Talitha Givens DD:  11/10/00 TD:  11/10/00 Job: [id number]

## 2010-05-29 NOTE — Discharge Summary (Signed)
[place]. [facility]  Patient:    [name], [name]                   MRN: [id number] Adm. Date:  [id number] Attending:  Ronaldo Miyamoto Dictator:   Joellyn Rued, P.A.C. CC:         [name]. Riley Kill, M.D. [facility]   Referring Physician Discharge [place]  DATE OF BIRTH:  11/29/1945  SUMMARY OF HISTORY:  [name] is a 65 year old white male with bypass surgery in 1997 and angioplasty to his ramus in June 2002.  He developed exertional chest discomfort on the evening prior to admission relieved by rest and three sublingual nitroglycerin sprays.  On the day of admission he again had exertional chest discomfort relieved with three sublingual nitroglycerin. He stated that he had only walked two blocks on the evening prior and 100 feet this morning.  He describes it as his usual angina 3/10 associated with shortness of breath.  He did not have any nausea, vomiting, diaphoresis, or radiation.  He felt that until the evening prior to admission he was improving.  He also has a history of hypertension family history.  LABORATORIES:  Enzymes and troponins were negative for myocardial infarction. Admission sodium 141, potassium 4.2, BUN 15, creatinine 1.0.  PT 13.0, PTT 29. H&H 12.2 and 35.0.  Normal indices.  Platelets 176,000, WBC 4.1.  EKG showed sinus bradycardia, nonspecific ST-T wave changes.  HOSPITAL COURSE:  Mr. Berenson was admitted and continued on his home medications.  Catheterization performed on July 10 by Dr. Chales Abrahams revealed native three vessel coronary artery disease.  The saphenous vein graft to the RCA was patent with a distal 30% RCA lesion post anastomosis.  The LIMA to the LAD was patent.  The ramus at the prior stent site had 30% restenosis in the proximal portion.  The circumflex had an 80%/60% proximal lesion and 80% OMI. Dr. Chales Abrahams performed angioplasty to the AB circumflex reducing the 80/60% lesion to 10%.  LV gram was not  performed.  Post sheath removal and bed rest patient was ambulating without difficulty.  Catheterization site was intact. On the morning of the 11th after Dr. Riley Kill reviewed it was felt that he could be discharged home with the diagnosis of unstable angina status post angioplasty of proximal circumflex as previously described.  History as previously.  DISPOSITION:  He is discharged home.  He is asked to continue Plavix 75 mg q.d. indefinitely, Toprol XL 50 mg q.d., Ecotrin 325 mg q.d., Imdur 30 mg q.d., Norvasc 5 mg q.d., Lipitor 10 mg q.h.s., Vioxx 25 mg as previously, B12 monthly.  He stated he wanted to continue with the sublingual nitroglycerin spray versus the tablets p.r.n.  He was told no lifting, driving, sexual activity, or heavy exertion for two days.  Maintain low salt, fat, cholesterol diet.  If he had any problems with his catheterization site he was instructed to call immediately.  He has an appointment with Dr. Riley Kill on July 25 at 3:15 p.m. DD:  07/21/00 TD:  07/21/00 Job: [id number]

## 2010-05-29 NOTE — Discharge Summary (Signed)
NAME:  [name], [name] NO.:  [id number]   MEDICAL RECORD NO.:  [id number]          PATIENT TYPE:  INP   LOCATION:  6522                         FACILITY:  [facility]   PHYSICIAN:  Olga Millers, M.D. LHCDATE OF BIRTH:  [birth year]   DATE OF ADMISSION:  10/15/2003  DATE OF DISCHARGE:  10/18/2003                                 DISCHARGE SUMMARY   PROCEDURES:  1.  Cardiac catheterization.  2.  Coronary arteriogram.  3.  Graft angiogram.  4.  Left ventriculogram.  5.  LIMA arteriogram.  6.  Relook catheterization.  7.  PTCA and Cypher stent to one vessel.   HOSPITAL COURSE:  [name] is a 65 year old male with known disease.  He  had bypass surgery with LIMA to LAD, RIMA to PDA.  Subsequent to that, he  had stents to the ramus intermedius as well as POBA to the bifurcation of  the ramus intermedius and proximal circumflex.  On the day of admission, he  developed left jaw discomfort and shortness of breath.  He initially went to  [facility] emergency room and was transferred to [facility] for  further evaluation and catheterization.  [name] enzymes were  negative for MI and he had a cardiac catheterization on October 15, 2003.  The cardiac catheterization showed patent LIMA, patent RIMA, but the left  main had an approximately 75% stenosis.  There was 20% stenosis in the stent  of the ramus, and distal to the stent there was a 40% stenosis.  Dr.  Diona Browner evaluated the films with Dr. Riley Kill, and it was felt that PTCA and  stent to the  projected left main was the best option.  This was performed  on October 17, 2003.   On October 17, 2003, [name] had a Cypher stent placed to the left main  producing a stenosis from 75% to 0.  Post procedure, [name] did very  well.   By October 18, 2003, he was ambulating without chest pain or shortness of  breath. His labs were stable and his cath site was within hematoma or bruit.  [name] was  considered stable for discharge on October 18, 2003, without  patient follow up arranged.   As part of his evaluation, [name] had a TSH checked.  The TSH was  elevated at 8.644.  A free T4 was borderline low at 0.99, and a T3 was 2.9.  It was felt that supplementation was needed with Synthroid, and this was  started at 25 mcg daily.  He is to see Dr. Nobie Putnam in November and get a  recheck on his blood work.   CONDITION ON DISCHARGE:  Improved.   DISCHARGE DIAGNOSES:  1.  Unstable anginal pain, status post PTCA and Cypher stent to the      projected left main this admission.  2.  Status post aortic coronary bypass surgery in 1997 with LIMA to the LAD      and RIMA to PDA.  3.  Status post PTCA and stent to the ramus intermedius as well as PTCA to  the bifurcation of the ramus and circumflex.  4.  Preserved left ventricular function with an EF of 65% at cath and no      mitral regurgitation.  5.  Intolerance to codeine.  6.  Family history of coronary artery disease.  7.  Hypertension.  8.  Hyperlipidemia.  9.  Arthritis.  10. Status post bilateral knee surgery and hernia repair.  11. Hypothyroidism.  12. Allergy to sulfa.   DISCHARGE INSTRUCTIONS:  1.  Activity level is to include no straining or lifting for a week.  2.  He is to stick to a low-fat diet.  3.  He is to clean the cath site and call for questions.  4.  He is to follow up with Dr. Rosalyn Charters PA on October 11 at 2:30.  5.  He is to see Dr. Nobie Putnam in November.   DISCHARGE MEDICATIONS:  1.  Toprol XL 50 mg daily.  2.  Norvasc 5 mg daily.  3.  Altace 2.5 mg daily.  4.  Isosorbide 60 mg daily.  5.  Celebrex 200 mg q.36h.  6.  Vytorin 10/40 q.h.s.  7.  Plavix daily.  8.  Nitroglycerin p.r.n.  9.  Aspirin 325 mg daily.  10. Vitamins and calcium as prior to admission.       RB/MEDQ  D:  10/18/2003  T:  10/18/2003  Job:  [id number]   cc:   [name]. Riley Kill, M.D. [facility]

## 2010-05-29 NOTE — Assessment & Plan Note (Signed)
[facility]                            CARDIOLOGY OFFICE NOTE   [name], [name]                   MRN:          [id number]  DATE:12/15/2005                            DOB:          Mar 06, 1945    CARDIOLOGIST:  [name]. Riley Kill, MD, F.A.C.C.   PRIMARY CARE PHYSICIAN:  Patrica Duel, M.D.   HISTORY OF PRESENT ILLNESS:  [name] is a 65 year old male patient,  followed by Dr. Riley Kill with the history of coronary disease, status  post CABG with LIMA to the LAD and free RIMA to the posterior descending  of the RCA.  He has had multiple percutaneous coronary interventions  since that time.  He underwent stenting of the large ramus intermedius,  as well as balloon dilatation of the bifurcation, in 2002, by Dr.  Gerri Spore.  He developed recurrent angina in October of 2005.  Catheterization at that time revealed 70% eccentric stenosis in the left  main artery.  This was stented with a Cypher drug eluting stent.  At  catheterization, his LIMA to the LAD and RIMA to the posterior  descending were both patent.  There was a 20% stenosis in the stent in  the ramus and in the distal stent there was 40% stenosis.   The patient was recently diagnosed with Stage T1C prostate  adenocarcinoma.  He set up this appointment to discuss his  cardiovascular risk before proceeding with surgery.  In reading through  the notes, it looks like his best option is for surgery plus radiation  therapy.  The patient denies any recurrent angina.  He does occasionally  have a chest ache.  This is fairly constant.  There is no relation to  exertion.  There is no shortness of breath, syncope, presyncope,  orthopnea or paroxysmal nocturnal dyspnea, lower extremity edema.  Denies any palpitations.  He recently had some light-headedness with  standing.  Dr. Nobie Putnam lowered his dose of Norvasc and this has  resolved.   CURRENT MEDICATIONS:  1. Levothyroxine 25 micrograms  daily.  2. Niaspan 500 mg daily.  3. Aspirin 81 mg daily.  4. Coenzyme Q10.  5. Multivitamin.  6. Glucosamine.  7. Fish oil.  8. Norvasc 5 mg daily.  9. Plavix 75 mg daily.  10.Toprol XL 50 mg daily.  11.Celebrex 200 mg daily.  12.Altace 2.5 mg daily.  13.Zetia 10 mg daily.  14.Nitroglycerin p.r.n. chest pain.   ALLERGIES:  CODEINE.   SOCIAL HISTORY:  The patient denied tobacco abuse.  He drinks alcohol  occasionally.   PHYSICAL EXAMINATION:  He is a well-nourished, well-developed male, in  no distress.  Blood pressure 115/70, pulse 68, weight 216 pounds.  HEENT:  Unremarkable.  NECK:  Without JVD.  CARDIAC EXAM:  S1, S2, regular rate and rhythm.  LUNGS:  Clear to auscultation bilaterally, without wheezing, rhonchi or  rales.  ABDOMEN:  Soft, nontender, with normoactive bowel sounds, no  organomegaly.  EXTREMITIES:  Without edema.  Calves are soft, nontender.  SKIN:  Warm and dry.  NEUROLOGIC:  He is alert and oriented times three.  Cranial nerves II  through XII  grossly intact.   Electrocardiogram reveals sinus rhythm, heart rate of 67, normal axis,  right ventricular conduction delay, frequent PVCs, no significant  changes from previous tracings.   IMPRESSION:  1. Coronary artery disease.      a.     Status post Cypher drug eluting stenting to the left main       artery, October 2005.      b.     Status post intervention to the ramus intermedius branch,       October 2002, with a bare metal stent.      c.     Status post CABG, 1997, with grafts as noted above - patent       by last catheterization.  2. Preserved LV function with EF of 65%.  3. Family history of coronary disease.  4. Hypertension.  5. Hyperlipidemia.  6. Prostate cancer.  7. Status post bilateral knee surgery and hernia repair.  8. Treated hypothyroidism.  9. Degenerative joint disease.   PLAN:  The patient is doing well from a cardiovascular standpoint.  He  has had some atypical chest pains.   These are not reminiscent of his  previous angina.  His last catheterization is as noted above.  His last  nuclear study was in 2002.  His last exercise tolerance test was in May  of 2005.  At that time, there were no EKG changes to suggest ischemia.  The patient has recently been diagnosed with prostate cancer.  He will  need clearance for surgery.  At this point in time, we will set the  patient up for adenosine Myoview testing.  I will be discussing this  further with Dr. Riley Kill.  The patient has a drug eluting stent in his  left main artery.  I will discuss with Dr. Riley Kill about further  recommendations regarding holding Plavix for any upcoming surgery, as  well as his aspirin.  We will bring the patient back in followup in the  next several weeks to discuss this further, before clearing him for  surgery, after his stress test is completed.      Tereso Newcomer, PA-C  Electronically Signed      Madolyn Frieze. Jens Som, MD, [facility]  Electronically Signed   SW/MedQ  DD: 12/15/2005  DT: 12/15/2005  Job #: [id number]   cc:   Patrica Duel, M.D.  [facility] [place] [address] Dr. Chipper Herb Marlena Clipper

## 2010-05-29 NOTE — H&P (Signed)
[place]. [facility]  Patient:    [name], [name]                   MRN: [id number] Adm. Date:  [id number] Attending:  Lewayne Bunting Dictator:   Abelino Derrick, P.A.C. [facility]                         History and Physical  CHIEF COMPLAINT:  Chest pain and jaw pain.  HISTORY OF PRESENT ILLNESS:  [name] is a 65 year old male with a history of coronary disease.  He had bypass surgery DD:  06/30/00 TD:  06/30/00 Job: [id number]

## 2010-05-29 NOTE — Cardiovascular Report (Signed)
NAME:  [name], [name] NO.:  [id number]   MEDICAL RECORD NO.:  [id number]          PATIENT TYPE:  INP   LOCATION:  6522                         FACILITY:  [facility]   PHYSICIAN:  [name]. Riley Kill, M.D. [facility] OF BIRTH:  07/10/1945   DATE OF PROCEDURE:  10/17/2003  DATE OF DISCHARGE:                              CARDIAC CATHETERIZATION   INDICATIONS FOR PROCEDURE:  The patient is a delightful 65 year old  gentleman who has previously undergone revascularization surgery.  The  patient had a left internal mammary artery to the LAD and a free RIMA to the  posterior descending branch of the right coronary artery.  He subsequently  has undergone stenting to the large ramus intermedius as well as balloon  dilatation of the bifurcation.  Last intervention was in October of 2002.  On October 15, 2003, he underwent cardiac catheterization by Jonelle Sidle, M.D. [facility].  This revealed progression of disease in the left main  of approximately 70%.  In addition, Dr. Juanda Chance and I reviewed the films and  there was some question of possible filling defect in the proximal right  graft.  Because of this, he was brought back to the laboratory for  evaluation of the right graft and probable percutaneous stenting of the left  main.  The left main is protected by the internal mammary to the LAD.   PROCEDURE:  1.  Relook at the right vein graft.  2.  Percutaneous stenting of the left main coronary artery.   DESCRIPTION OF PROCEDURE:  The patient was brought to the catheterization  lab and prepped and draped in the usual sterile fashion.  Through an  anterior puncture, the left femoral artery was entered.  Bivalirudin was  given according to protocol and ACT checked and found to be appropriate.  We  used a JR4 6 [place] guiding catheter to try to inject the internal mammary  free RIMA graft well to the distal right coronary artery.  This demonstrated  continued patency without a high  grade proximal lesion.  Attention was then  turned toward the left main.  A JL4 guiding catheter was utilized.  The  lesion was crossed with a Hi-Torque floppy wire.  Balloon dilatation was  done with a 12 mm length x 3.25 Quantum Maverick balloon.  This was taken up  to about 6 atmospheres.  Following this, there was no loss of sidebranch and  a 3.0 x 13 [employer] Cypher stent was placed across the lesion.  This  was then deployed at 16 atmospheres for 53 seconds in the left main.  We  knew this was slightly undersized and then a 3.75 x 8 Copy balloon was then taken up to 13 atmospheres in the proximal  vessel.  The distal vessel was dilated at 12 and 9 atmospheres.  There was  marked improvement in the appearance of the artery and what appeared to be  reasonably good apposition without sidebranch loss.  The procedure was  completed and all catheters were subsequently removed and the femoral sheath  sewn into place.  We elected not to  dilate the bifurcation. He was taken to  the holding area in satisfactory clinical condition.   ANGIOGRAPHIC DATA:  1.  The left main has about a 70% eccentric stenosis that has the majority      of plaque burden along the inferior ridge.  Following stenting, this      became widely patent.  After this, this was reduced from 70% down to      about 10%. There was no loss of the AV circumflex and no loss of the      takeoff of the LAD.  In addition, there was telescoping of the stent      into the previously placed NIR stent which does not have high grade      restenosis.  The vessel does have bifurcational narrowing distally, but      it appears to be highly unchanged.  There is a bifurcational stenosis of      about 60% narrowing in the upper limb, perhaps 40 to 50% narrowing in      the lower limb.  Both appear to have excellent antegrade flow.  2.  The right internal mammary artery to the distal right coronary artery  is      widely patent.   CONCLUSION:  Successful percutaneous stenting of a protected left main.   DISPOSITION:  The patient will be treated medically.  He will need aspirin  and Plavix longterm.       TDS/MEDQ  D:  10/17/2003  T:  10/18/2003  Job:  [id number]   cc:   Patrica Duel, M.D.  [address]  [place]  [place] [address]  Fax: [phone]

## 2010-05-29 NOTE — Letter (Signed)
January 17, 2006    Windy Fast L. Earlene Plater, M.D.  [address]  [place], [place] [address]   RE:  FOUNTAIN, DERUSHA  MRN:  [id number]  /  DOB:  June 24, 1945   Dear Ferne Reus:   I had the pleasure of seeing Gedalia Mcmillon in the office today.  In  summary, my understanding is that he has prostate cancer and is  scheduled to undergo robotic assisted laparoscopic radical prostatectomy  with bilateral pelvic lymph node dissection.  As you know, the patient  has previously undergone revascularization surgery and has patent  internal mammaries with a left internal mammary to the LAD and a free  RIMA to the distal right coronary artery.  These are intact.  He does  have a bifurcation lesion that has been previously stented and I placed  a left main stent in this gentleman in 2005.  He has continued to do  well and remained on aspirin and Plavix.  The stent in his left main is  a drug eluting stent.  He has not had clinical symptoms.  His follow-up  adenosine Cardiolite suggested mild decrease in activity at the base of  the anterior septum but this was felt to be a low risk study.   MEDICATIONS:  1. Levothyroxine daily.  2. Niaspan daily.  3. Aspirin 81 mg daily.  4. Co-Q 10.  5. Multivitamin daily.  6. Glucosamine and fish oil b.i.d.  7. Norvasc 5 mg daily.  8. Plavix 75 mg daily.  9. Toprol 50 mg daily.  10.Celebrex 200 mg daily.  11.Zetia 10 mg daily.  12.Welchol 625 two daily.  13.Lisinopril 5 daily.  14.Metamucil.   PHYSICAL EXAMINATION:  GENERAL APPEARANCE:  He is alert and oriented, in  no distress.  VITAL SIGNS:  Blood pressure 140/83, pulse 59.  LUNGS:  The lung fields are clear.  CARDIOVASCULAR:  Regular rhythm.   This gentleman is stable from a cardiovascular standpoint.  He has done  well clinically.  My biggest concern is that he does have a drug eluting  stent in the left main.  My preference would be for him to remain on  aspirin during the procedure and he could  stop his Plavix 5-6 days  before surgery.  As we have discussed in the past, our biggest concern  is about the potential for late stent thrombosis, and this to some  extent is an unpredictable event.  Whether it is enhanced by  discontinuation of dual antiplatelet therapy, and exacerbated by a  surgical procedure is unclear, but at the present time my preference  would be for him to remain on aspirin.  He and I have discussed this at  length.   I appreciate the opportunity to sharing in his care.  Please let me know  if this represents a problem and we will see him back in follow-up in  three months.    Sincerely,      [name]. Riley Kill, MD, [facility] [place]  Electronically Signed    TDS/MedQ  DD: 01/17/2006  DT: 01/17/2006  Job #: [id number]   CC:    Patrica Duel, M.D.

## 2010-05-29 NOTE — Cardiovascular Report (Signed)
[place]. [facility]  Patient:    [name], [name]                   MRN: [id number] Proc. Date: 07/01/00 Adm. Date:  [id number] Attending:  Lewayne Bunting CC:         Jonell Cluck, M.D.             [name]. Riley Kill, M.D. [facility]             Cardiac Catheterization Laboratory                        Cardiac Catheterization  PROCEDURES PERFORMED: 1. Left heart catheterization with coronary angiography and left    ventriculography. 2. Percutaneous transluminal coronary angioplasty utilizing the Cutting    Balloon of the ramus intermediate, as well as a side branch of the    ramus intermediate for in-stent re-stenosis.  INDICATIONS:  The patient is a 65 year old male, with a history of previous coronary artery bypass grafting.  In August of 2000 he underwent stent placement x2 in a very large ramus intermediate branch.  The more distal stent was placed across a large side branch.  He presented to the hospital with unstable angina and was referred for cardiac catheterization.  CATHETERIZATION  PROCEDURE:  A 6 French sheath was placed in the right femoral artery.  Standard Judkins 6 French catheters were utilized.  Contrast was Omnipaque.  There were no complications.  CATHETERIZATION RESULTS:  HEMODYNAMICS:  Left ventricular pressure 120/16.  Aortic pressure 120/66. There was no aortic valve gradient.  LEFT VENTRICULOGRAM:  There is moderate hypokinesis of the anterolateral wall but otherwise normal wall motion.  Ejection fraction calculated at 57%. There is no mitral regurgitation.  CORONARY ARTERIOGRAPHY:  (Right dominant).  Left main:  Left main has a distal 30% stenosis.  Left anterior descending:  The left anterior descending artery has a diffuse 70% stenosis in the proximal vessel followed by a diffuse 90% stenosis in the proximal portion of the mid vessel followed by 100% occlusion of the proximal portion in the mid vessel.  There is a small  first diagonal before the vessel was occluded.  Left circumflex:  The left circumflex gives rise to a very large ramus intermediate with a large side branch.  There is a small OM-1, small OM-2. The ramus intermediate has two tandem stents overlapping placed within it. The more distal of these stents is across the large side branch.  Within the proximal stent is a diffuse 40% stenosis.  In the more distal stent is a 75% stenosis at the area of the bifurcation.  The side branch itself arising from within the second stent has a 95% stenosis at its origin.  The first obtuse marginal branch has a small and has a 50% stenosis in it.  Right coronary artery:  The right coronary artery has a diffuse 90% stenosis in the proximal to mid vessel.  The distal vessel has a 30% stenosis.  The distal right coronary artery gives rise to a normal sized posterior descending artery and a small posterolateral branch.  GRAFTS:  Left internal mammary artery to the distal LAD is patent throughout its course.  This fills the mid and distal LAD including two small diagonal branches.  There was a free right internal mammary artery graft anastomosed to the distal right coronary artery.  This is patent throughout its course.  Beyond the graft insertion in the native  vessel is a 30% stenosis.  IMPRESSIONS: 1. Left ventricular systolic function in the lower range of normal. 2. Native three-vessel coronary artery disease. 3. Patent grafts to the left anterior descending artery and the    right coronary artery. 4. Re-stenosis of the more distally placed stent in the ramus intermedius,    as well as the side branch jailed by that stent.  PLAN:  Percutaneous intervention of the ramus intermediate.  See below.  PERCUTANEOUS TRANSLUMINAL CORONARY ANGIOPLASTY PROCEDURE:  Following compl;etion of diagnostic catheterization, we proceeded with coronary intervention.  The 6 French sheath in the right femoral artery was  exchanged for a 7 [place] sheath.  Heparin and Integrilin were administered per protocol. We used a 7 [place] JL 4.5 guiding catheter.  A BMW wire was placed down the main branch of the intermediate.  We then passed a Hi-Torque Floppy wire through the side struts of the stent into the side branch.  We initially attempted to pass a 3.5 x 15 mm Cutting Balloon into the main vessel. However, this would not pass due to the severity of the stenosis.  We therefore used a 3.0 x 15 mm Quantum balloon inflating this to 12 atmospheres. Following this, there was more compromise of the side branch with TIMI-2 flow in the side branch.  We therefore advanced a 2.0 x 15 mm CrossSail balloon into the side branch and inflated this to 10 atmospheres.  We then advanced a 3.5 x 15 mm Cutting Balloon into the main intermediate vessel and inflated this to 6, 8 and then 10 atmospheres sequentially.  Following this, we readvanced a 2.0 x 15 mm CrossSail balloon into the side branch and inflated it to 14 atmospheres.  We then advanced a 2.5 x 15 mm Cutting Balloon to the side struts of the stent into the side branch and inflated this to 6, 8 and then 10 atmospheres.  This resulted in significant improvement in the side branch.  However, the main vessel itself was somewhat compromised by this. We therefore went back with the 3.5 x 15 mm Cutting Balloon in the main vessel inflated this for two inflations to 10 atmospheres each.  After this, again there was some compromise of the side branch.  We therefore proceeded with Cutting Balloon angioplasty.  A 2.5 x 15 mm Maverick balloon was placed in the side branch and 3.0 x 15 Quantum balloon was placed in the main vessel.  The Maverick balloon was inflated to 4, 6, and 6 atmospheres.  The Quantum balloon was inflated to 2, 6, and 4 atmospheres, each of these above three inflations were done simultaneously.  Final angiographic images reveal patency of both vessels with less  than 20% residual stenosis in the main branch at the ramus  intermediate and 40% residual stenosis in the side branch.  There is TIMI-3 flow into the distal vessels.  COMPLICATIONS:  None.  IMPRESSION:  Complex, but successful balloon angioplasty utilizing the Cutting Balloon followed by a kissing balloon technique of the ramus intermediate and side branch as described.  The main vessel had a 75% stenosis reduced to 25% and a side branch had a 95% stenosis reduced to 40%.  PLAN:  Integrilin will be continued for 18 hours.  The patient will be treated with Plavix for four months. DD:  07/01/00 TD:  07/01/00 Job: [id number]

## 2010-05-29 NOTE — Cardiovascular Report (Signed)
[place]. [facility]  Patient:    [name], [name] Visit Number: [id number] MRN: [id number]          Service Type: MED Location: [id number] Attending Physician:  Ronaldo Miyamoto Proc. Date: 09/06/00 Adm. Date:  09/05/2000 Disc. Date: 09/07/2000   CC:         Patrica Duel, M.D.  [name]. Riley Kill, M.D. [facility]  Cath Lab   Cardiac Catheterization  PROCEDURE: 1. Left heart catheterization with coronary angiography, left    ventriculography, and bypass graft angiography. 2. PTCA with stent placement in the proximal left circumflex. 3. PTCA with stent placement in the first obtuse marginal branch.  INDICATIONS:  Mr. Vandehei is a 65 year old male with a history of previous bypass surgery and previous coronary interventions including stent placements to the ramus intermediate and balloon angioplasty of the obtuse marginal.  He presented with recurrent symptoms of unstable angina.  DESCRIPTION OF PROCEDURE:  A 6 French sheath was placed in the right femoral artery.  Standard Judkins 6 French catheters were utilized.  Contrast was Omnipaque.  There were no complications.  RESULTS:  HEMODYNAMICS:  Left ventricular pressure 144/22, aortic pressure 138/74. There was no aortic valve gradient.  LEFT VENTRICULOGRAM:  Wall motion is normal.  Ejection fraction is estimated at 60%.  There is trace mitral regurgitation.  CORONARY ARTERIOGRAPHY:  (Right dominant).  Left main is normal.  Left anterior descending artery has a 50% stenosis in the proximal vessel.  It gives off a small first diagonal after which the mid LAD is 100% occluded. The distal LAD fills via left internal mammary artery graft.  Left circumflex has a 99% stenosis in the proximal vessel with Timi 2 flow beyond this.  Prior to this stenosis, the circumflex gives rise to a very large branching ramus intermediate which has two overlapping stents.  Within the proximal stented  portion of the ramus intermediate, is a diffuse less than 20% stenosis.  In the distal stented area, there is a focal 40% stenosis. There is a large sidebranch arising from within the more distal of the two stents which is a 50% stenosis at its origin.  The circumflex also gives rise to a small first obtuse marginal which has a 90% stenosis and a small second obtuse marginal.  Right coronary artery is a dominant vessel.  There is a 70% stenosis in the proximal vessel and diffuse 95% stenosis in the midvessel.  The distal vessel fills via free right internal mammary artery graft.  Left internal mammary artery to the distal LAD is patent throughout its course filling the mid and distal LAD.  Free right internal mammary artery to the distal right coronary artery is patent throughout its course filling the distal right coronary artery which includes a normal size posterior descending artery and small posterolateral branch.  IMPRESSION: 1. Preserved left ventricular systolic function. 2. Native three vessel coronary artery disease. 3. Status post coronary artery bypass grafting with patent grafts to the LAD    and right coronary artery. 4. Significant restenosis in the proximal left circumflex with Timi 2 flow    into this vessel.  There is patent stent in the large obtuse marginal    branch.  Of note, the circumflex is a relatively small vessel being    approximately 2 mm in diameter.  PLAN:  Percutaneous intervention of the left circumflex and obtuse marginal. See below.  PTCA PROCEDURE:  Following the completion of the diagnostic catheterization, we proceeded  with coronary intervention.  Heparin and Integrilin were administered per protocol.  We used a 6 [place] CLS4 guiding catheter and a BMW wire.  We initially dilated the lesion in the proximal circumflex with a 2.0 x 15 mm Maverick balloon for a total of three inflations to 6, 14, and 14 atmospheres for up to two minutes at a  time.  This resulted in significant improvement in the lumen and restoration of Timi 3 flow.  However, after observation, there was recoil to about 40% stenosis.  In addition, after Timi 3 flow was established into the distal circumflex, the first obtuse marginal which was also small in caliber was found to have a 90% stenosis.  We therefore positioned the 2.0 x 15 mm Maverick balloon across the stenosis in the first obtuse marginal and inflated this to 10 atmospheres.  This resulted in improvement in vessel lumen, however, there was a longitudinal dissection. We went back with the same balloon to 4 atmospheres for a total of three minutes, however, the dissection persisted.  Because of the concern over the possibility of abrupt closure, we opted to place a small stent in the obtuse marginal.  We positioned a 2.0 x 13 mm Pixel stent in the obtuse marginal and deployed this at 10 atmospheres.  We then deployed a 2.25 x 13 mm stent in the proximal left circumflex at a deployment pressure of 12 atmospheres.  Final angiographic images revealed patency of both sites with 0% residual stenosis in both the circumflex and the obtuse marginal and Timi 3 flow into the distal vessel.  There was no evidence of residual dissection.  COMPLICATIONS:  None.  RESULTS: 1. Successful percutaneous transluminal coronary angioplasty with stent    placement in the proximal left circumflex reducing a 99% stenosis with    Timi 2 flow to 0% residual with Timi 3 flow. 2. Successful percutaneous transluminal coronary angioplasty with stent    placement in the obtuse marginal branch reducing a 90% stenosis to 0%    residual with Timi 3 flow.  PLAN:  Integrilin will be continued for 18 hours.  Plavix will be administered for four weeks. Attending Physician:  Ronaldo Miyamoto DD:  09/06/00 TD:  09/07/00  Job: [id number]

## 2010-05-29 NOTE — Op Note (Signed)
NAME:  [name], [name] NO.:  [id number]   MEDICAL RECORD NO.:  [id number]          PATIENT TYPE:  INP   LOCATION:  0002                         FACILITY:  [facility]   PHYSICIAN:  Lucrezia Starch. Earlene Plater, M.D.  DATE OF BIRTH:  10-03-1945   DATE OF PROCEDURE:  02/23/2006  DATE OF DISCHARGE:                               OPERATIVE REPORT   DIAGNOSIS:  Adenocarcinoma of the prostate.   OPERATIVE PROCEDURE:  Robotic radical prostatectomy with bilateral  pelvic lymphadenectomy.   SURGEON:  Gaynelle Arabian, M.D.   ASSISTANT:  Heloise Purpura, M.D.   ANESTHESIA:  General endotracheal.   ESTIMATED BLOOD LOSS:  150 mL.   Two large round Blake drains and 20-French coude Foley catheter.   INDICATIONS FOR PROCEDURE:  Ms. Winterrowd is a very nice 65 year old  white male who originally presented with an elevated PSA of 5.36.  He  subsequently went biopsies by Dr. Rito Ehrlich in [place] and was found  to have a Gleason 7, which was 4+3 adenocarcinoma diagnosed bilaterally,  5% of biopsies and up to 80% in others.  He has undergone metastatic  workup which was negative.  He has use been properly preoperatively  cleared for surgery and after understanding risks, benefits and  alternatives, has elected to proceed with robotic radical prostatectomy  and bilateral pelvic lymphadenectomy.   PROCEDURE IN DETAIL:  The patient is placed in supine position.  After  proper general tracheal anesthesia was placed in the dorsal lithotomy  position, exaggerated lithotomy, and prepped and draped with Betadine in  sterile fashion.  A 12 mm periumbilical port was placed under direct  vision with a small incision and the abdomen was insufflated and  examined with the camera and noted to be no significant abnormalities.  Robotic ports were placed under direct vision in the usual manner one  handbreadth lateral and 16 cm superior to pubic symphysis and one  handbreadth lateral to the periumbilical port.  A  third arm port was  placed lateral and superior to the left robotic arm port and a 5 mm port  was placed superior and lateral to the periumbilical port and a 12 mm  working port was placed and lateral and slightly superior to the right  arm port and the robot was attached and the dissection was begun.  The  bladder 22-French Foley catheter had been placed, 30 mL balloon and the  bladder was filled with sterile water.  The bladder was dissected down  utilizing the hot shears and the left arm utilized the Erbe graspers.  The bladder was taken down to the space of Retzius and it was then  drained.  Dissection was carried down to the endopelvic fascia  bilaterally.  This was taken down and the dorsal vein complex was  dissected free and the area above the prostate was defatted.  The dorsal  vein complex was then clamped with an Endo GIA and cut with Endo GIA  device and the bladder neck was incised directly down to the Foley  catheter was used as a traction device.  The posterior bladder neck was  then taken  down.  The seminal vesicles and ampulla vas deferens were  visualized and with the 30 degrees down lens, were taken down in their  entirety in good hemostasis noted be present down to Denonvilliers  fascia.  Bilateral neurovascular bundles were then taking down in [place]  technique and the pedicles were taken bilaterally with large Hem-o-lok  clips, cut and the neurovascular bundle remained intact.  Dissection was  carried down on Denonvilliers fascia to the apex of the prostate.  The  urethra was then cut sharply with leaving a stump.  The posterior  urethral plate was taken down and the specimen was removed and placed in  the right lower quadrant.  Next, bilateral pelvic lymphadenectomy was  performed.  The right pelvic lymph nodes were taken both obturator and  external iliac lymph nodes were taken, iliac vein was identified.  There  was a large obturator artery and obturator vein which  were clipped and  cut on the right side.  The nerve was protected and the specimens  submitted to pathology.  A similar pelvic lymphadenectomy was performed  on the left side.  There was no obvious obturator artery and vein.  The  nerve again was identified and protected and the specimen was submitted  to pathology.  Good hemostasis was noted be present.  Urethrovesical  anastomosis was then performed.  A 2-0 Vicryl holding stitch was placed  in the 6 o'clock position to approximate the bladder neck to the  urethra.  The suture was performed with 3-0 Monocryl suture with dyed  and undyed sutures in a running type fashion under direct vision and  sewed anteriorly.  A 20-French coude Catheter was placed in the bladder  and the bladder was noted to irrigate clear.  1 ampule of indigo carmine  had been given IV and blue dye was obtained.  The bladder was then  irrigated.  There was no leakage.  The specimen was removed with an  Endocatch bag under direct vision.  The 12 mm port was closed with  suture passer and a 2-0 Vicryl suture and each port was removed and  visualized.  There was good hemostasis and the periumbilical port was  closed under direct vision with a running 2-0 Vicryl suture.  Each wound  was injected with 0.25% Marcaine closed with skin staples.  It might be  noted a large round Blake drain had been placed through the third arm  port was in good position in the pelvis.  The patient tolerated  procedure well.  No complications.  He was taken to the recovery room  stable.      Ronald L. Earlene Plater, M.D.  Electronically Signed     RLD/MEDQ  D:  02/23/2006  T:  02/23/2006  Job:  [id number]

## 2010-05-29 NOTE — H&P (Signed)
NAME:  LAURIE, LOVEJOY NO.:  [id number]   MEDICAL RECORD NO.:  [id number]          PATIENT TYPE:  INP   LOCATION:  2908                         FACILITY:  [facility]   PHYSICIAN:  Olga Millers, M.D. [facility] OF BIRTH:  1945/10/29   DATE OF ADMISSION:  10/15/2003  DATE OF DISCHARGE:                                HISTORY & PHYSICAL   Mr. Yellin is a 65 year old male patient of Dr. Riley Kill with a past  medical history of coronary artery disease, status post coronary bypassing  graft, hypertension, hyperlipidemia, who was transferred from [facility]  with jaw pain.  The patient's cardiac history dates back to 1997, when he  underwent coronary artery bypassing graft with a LIMA to the LAD and a free  RIMA to the PDA.  He subsequently has had interventions to his circumflex  and ramus intermedius, last in 2002.  He typically does well with no dyspnea  on exertion, orthopnea, PND, pedal edema, palpations, exertional chest pain,  or syncope.  Over the past week he does complain of an occasional tingling  in his chest.  This was not related to exertion, and he attributes this to  arthritis; however, over the past one to two days he has noticed increased  shortness of breath and yesterday had 15 minutes of jaw pain in the left jaw  area that is similar to his symptoms prior to revascularization.  He was  seen at [facility] emergency room and was transferred for further  evaluation.  Of note, he is pain-free at the time of this evaluation.   His medications at present include:  1.  Toprol 50 mg p.o. daily.  2.  Norvasc 5 mg p.o. daily.  3.  Altace 2.5 mg p.o. daily.  4.  Imdur 60 mg p.o. daily.  5.  Celebrex 200 mg p.o. q.36h.  6.  Vytorin 10/40 mg q.h.s.  7.  Plavix 75 mg p.o. daily.  8.  Aspirin 325 mg p.o. daily.  9.  Vitamins.  10. Calcium.   He has an intolerance to CODEINE.  There is no dye allergy.   SOCIAL HISTORY:  He does not smoke, but he does occasionally  consume  alcohol.   FAMILY HISTORY:  Positive for coronary artery disease.   PAST MEDICAL HISTORY:  Significant for hypertension and hyperlipidemia, but  there is no diabetes mellitus.  He has a history of coronary disease as  outlined in the HPI with prior coronary artery bypassing graft as well as  percutaneous interventions.  He has a history of arthritis.  He has also had  previous bilateral knee surgery as well as hernia repair.  There is no other  significant past medical history noted.   REVIEW OF SYSTEMS:  He denies any headaches or fevers or chills.  There is  no productive cough or hemoptysis.  There is no dysphagia or odynophagia,  melena or hematochezia.  There is no dysuria or hematuria.  There is no rash  or seizure activity.  There is no orthopnea, PND, or pedal edema.  He does  have arthritis, but there is no claudication  noted.  The remaining systems  are negative.   PHYSICAL EXAMINATION:  VITAL SIGNS:  His blood pressure is 125/74, and his  pulse is 65.  GENERAL:  He is well-developed and well-nourished, in no acute distress.  His skin is warm and dry.  He does not appear to be depressed, and there is  no peripheral clubbing.  HEENT:  Unremarkable with normal eyelids.  NECK:  Supple with normal upstroke bilaterally, and there are no bruits  noted.  There was no jugular venous distention and no thyromegaly.  CHEST:  Clear to auscultation with normal expansion.  CARDIOVASCULAR:  Regular rate and rhythm with normal S1 and S2.  There are  no murmurs, rubs, or gallops noted.  He is status post previous sternotomy.  ABDOMEN:  Not tender or distended, positive bowel sounds, no  hepatosplenomegaly, no mass appreciated.  There was no abdominal bruit.  He  has 2+ femoral pulses and no bruits.  EXTREMITIES:  His extremities show no edema, and I could palpate no cords.  He has 2+ dorsalis pedis pulses bilaterally.  NEUROLOGIC:  Grossly intact.   His electrocardiogram from  [facility] reveals a normal sinus rhythm with  nonspecific ST changes.  His hemoglobin by I-STAT was 12.9, and his  potassium was 3.6.  His point of care markers are negative.   DIAGNOSES:  1.  Jaw pain concerning for anginal equivalent.  2.  History of coronary artery disease, status post coronary artery      bypassing graft with a free right internal mammary artery to the      posterior descending artery and a left internal mammary artery to the      left anterior descending coronary artery.  3.  History of percutaneous coronary intervention of the ramus intermedius      and obtuse marginal.  4.  Hypertension.  5.  Hyperlipidemia.  6.  Mild hypoxia at the time of the evaluation with saturations on 2 L in      the 88-92% range.   PLAN:  Mr. Eley presents with symptoms that are concerning for recurrent  coronary artery disease.  We will plan to continue his aspirin and Plavix  and begin heparin.  I will also continue his home doses of Toprol and Imdur.  We will rule out myocardial infarction with serial enzymes.  Given his  symptoms are similar to those prior to his previous PCI, we will plan to  proceed with cardiac catheterization.  The risks and benefits are discussed  with the patient, and he agrees to proceed.  I will also check a D-dimer  given his recent travel to [place] and mild hypoxia at the time of the  evaluation.  If it is abnormal, then he will need a CT scan to exclude  pulmonary embolus.  We will also plan to check his chest x-ray.       BC/MEDQ  D:  10/15/2003  T:  10/15/2003  Job:  [id number]

## 2010-05-29 NOTE — H&P (Signed)
[place]. [facility]  Patient:    [name], [name]                   MRN: [id number] Adm. Date:  [id number] Disc. Date: [id number] Attending:  Lewayne Bunting Dictator:   Abelino Derrick, P.A.C. [facility]                         History and Physical  HISTORY OF PRESENT ILLNESS:  The patient is a 65 year old male, followed by Dr. Riley Kill and Dr. Nobie Putnam, who has had a history of coronary disease.  He had a bypass in 1997 with an LIMA to LAD, RIMA to PDA.  In August of 2000 he had an OM-1 stent.  He is admitted June 29, 2000, with recurrent chest pain consistent with angina.  His symptoms were resolved with nitroglycerin.  He describes "indigestion" type pain.  He was transferred from [facility] Emergency Room for further evaluation.  PAST MEDICAL HISTORY:  Remarkable for arthritis.  He has a history of hypertension, treated hyperlipidemia.  CURRENT MEDICATIONS: 1. Lipitor 10 mg a day. 2. Norvasc 5 mg a day. 3. Imdur 30 mg a day. 4. Vioxx and aspirin.  ALLERGIES:  He has no known drug allergies but has a codeine intolerance.  SOCIAL HISTORY:  He is married, has two children, he stopped smoking 15 years ago.  He does drink occasional alcohol.  FAMILY HISTORY:  Remarkable in that his mother died of a stroke in her 13s. Her father died of cancer and he has one brother with a history of coronary disease.  REVIEW OF SYSTEMS:  Essentially unremarkable except as noted above.  There is no history of recent weight gain or loss.  No visual problems.  No peptic ulcer disease history.  PHYSICAL EXAMINATION:  VITAL SIGNS:  Blood pressure 144/92, pulse 62, respirations 16.  GENERAL:  He is a well-developed, well-nourished, male in no acute distress.  HEENT:  Normocephalic.  Extraocular movements are intact.  Sclerae is nonicteric.  Lids and conjunctivae are within normal limits.  NECK:  Without JVD and without bruit.  CHEST:  Clear to auscultation and percussion  bilaterally.  CARDIAC:  Examination reveals regular rate and rhythm without murmur, rub or gallop.  He does have an S4.  ABDOMEN:  Soft, nontender.  No hepatosplenomegaly is appreciated.  EXTREMITIES:  Without edema.  Pulses are 2/4, symmetrical.  NEUROLOGICAL:  Examination is grossly intact.  He is awake, alert, oriented, and cooperative.  Moves all extremities without obvious deficit.  ECG shows sinus rhythm, incomplete right bundle branch block.  IMPRESSION: 1. Unstable angina, rule out progression of coronary disease. 2. Known coronary disease with coronary artery bypass grafting in    1997 and obtuse marginal #1 stenting in August of 2000. 3. Treated hypertension. 4. Hyperlipidemia. 5. History of arthritis.  PLAN:  The patient will be admitted, given one dose of Lovenox and started on nitrites and setup for cardiac catheterization. DD:  07/02/00 TD:  07/04/00 Job: [id number]

## 2010-05-29 NOTE — Discharge Summary (Signed)
[place]. [facility]  Patient:    [name], [name] Visit Number: [id number] MRN: [id number]          Service Type: MED Location: [phone] Attending Physician:  Talitha Givens Dictated by:   Guy Franco, P.A. [facility] Admit Date:  11/08/2000 Discharge Date: 11/11/2000   CC:         Dr. Patrica Duel, [address] [place], [place]  [address]   Referring Physician Discharge [place]  DISCHARGE DIAGNOSES: 1. Unstable angina, resolved. 2. Coronary artery disease. 3. Hyperlipidemia, treated. 4. Degenerative joint disease. 5. History of renal mass, which is being followed by Dr. Rito Ehrlich.  HISTORY OF PRESENT ILLNESS:  [name] is a 65 year old male patient of Dr. Bonnee Quin who presented to [facility] emergency room on November 08, 2000 after developing left jaw pain at lunch.  Over the next several hours, he gradually developed anterior chest tingling and pressure, became tired, and took sublingual nitroglycerin every five minutes x 3 without relief.  He denied any associated dyspnea, nausea, vomiting, or diaphoresis.  His coworkers took him to [facility] emergency room where he was placed on oxygen, IV heparin, Integrilin, without full pain relief.  His cardiac enzymes remained negative; however, with his continued pain he was transferred to [facility] for further treatment.  PAST MEDICAL HISTORY:  Is listed above.  In addition, his coronary artery disease has been identified as the following:  Status post CABG in 1997 with the following grafts:  LIMA to the LAD, right internal mammary artery to the RCA.  In June 2002 he underwent a PTCA to the ramus intermedius.  In July 2002 he underwent PTCA to the circumflex lesion.  In late August 2002 he underwent a PTCA/stent to the circumflex and OM-1.  HOSPITAL COURSE:  Upon transfer to [facility] the patient was scheduled for catheterization on October 30, 2000 and he underwent this procedure by  Dr. Bonnee Quin.  Patient then underwent percutaneous intervention with balloon at the bifurcation of the ramus.  One lesion was reduced from a 90% to a 50% lesion (superiorly); the other lesion went from a 70% to a 30% lesion (inferiorly). The patient also underwent an angioplasty within the stent of the proximal circumflex and this was reduced from a 70% lesion to a 30% lesion post procedure.  The patient was stable over the next 24 hours and was discharged to home on November 11, 2000.  DISPOSITION:  He was discharged to home in stable condition on the following medications.  MEDICATIONS: 1. Lipitor 10 mg one p.o. q.h.s. 2. Norvasc 5 mg one p.o. q.d. 3. Enteric-coated aspirin 325 mg one p.o. q.d. 4. Plavix 75 mg one p.o. q.d. 5. Toprol-XL 50 mg one p.o. q.d. 6. Vioxx 25 mg one p.o. q.d. 7. Imdur 30 mg one p.o. q.d. 8. Use sublingual nitroglycerin p.r.n. chest pain.  ACTIVITY:  No strenuous activity over the next two days but he is to gradually progress activity over the next week.  DIET:  Remain on low fat diet.  WOUND CARE:  Clean over catheterization site with soap and water and call with any concerns or questions.  FOLLOW-UP:  He has been notified of his follow-up appointment with Dr. Riley Kill on November 14 at 4:15 p.m.  LABORATORY DATA:  Of note, during the patients hospital stay his EKG revealed sinus bradycardia, rate 60, no acute ST-T wave changes noted.  Lab work shows hemoglobin 12.0, hematocrit 35.0, platelets 162, white count  4.1.  Cardiac isoenzymes were negative.  Potassium 4.0, BUN 9, creatinine 1.0. Dictated by:   Guy Franco, P.A. [facility] Attending Physician:  Talitha Givens DD:  11/11/00 TD:  11/11/00 Job: [id number]

## 2010-05-29 NOTE — Discharge Summary (Signed)
[place]. [facility]  Patient:    JW, COVIN Visit Number: [id number] MRN: [id number]          Service Type: MED Location: [phone] Attending Physician:  Ronaldo Miyamoto Dictated by:   Abelino Derrick, P.A.-C. [facility] Admit Date:  09/05/2000 Discharge Date: 09/07/2000   CC:         Jonell Cluck, M.D. - [address] [place], [place] [address]  Dr. Rito Ehrlich - [address], [place], [place] [address]   Discharge Summary  DISCHARGE DIAGNOSES: 1. Coronary artery disease, with percutaneous transluminal coronary    angioplasty and stenting to the circumflex and obtuse marginal-I on    September 06, 2000, by Dr. Loraine Leriche Pulsipher. 2. Coronary artery bypass grafting in 1997, with previous percutaneous    coronary intervention in June 2002, and July 2002. 3. Preserved left ventricular function. 4. Treated hyperlipidemia. 5. Degenerative joint disease. 6. Renal mass, questionable renal cell carcinoma.  The patient is followed    by a urologist in [place], Dr. Rito Ehrlich.  HISTORY OF PRESENT ILLNESS:  Mr. Fosnaugh is a 65 year old male who had a bypass in 1997, and catheterizations with interventions in June 2002, and July 2002, to the circumflex.  He presented to the emergency room on September 05, 2000, with chest pain consistent with unstable angina.  HOSPITAL COURSE:  He was seen by Dr. Rollene Rotunda.  He did have microhematuria and said he recently had an evaluation by his urologist in [place].  These results were obtained, and it turns out that he has possible renal cell cancer, and has a followup with his urologist on September 13, 2000.  In the interim he underwent a cardiac catheterization by Dr. Gerri Spore on September 06, 2000, which revealed patent SVG to the RCA, patent LIMA to the LAD, with a 99% circumflex stenosis and a 90% OM-I stenosis.  Both lesions were stented and dilated.  He had an ejection fraction of 60% with a trace of mitral  regurgitation.  The plan was for Integrilin for 18 hours and Plavix for four weeks.  He was seen on September 07, 2000, and felt to be stable.  DISPOSITION:  He was discharged and will follow up with Dr. [name]. [place], Dr. Rito Ehrlich, and Dr. Jonell Cluck.  DISCHARGE MEDICATIONS: 1. Plavix 75 mg q.d.  At discharge he was instructed to take this until    further notice. 2. Coated aspirin q.d. 3. Toprol XL 50 mg q.d. 4. Imdur 60 mg q.d. 5. Norvasc 5 mg q.d. 6. Lipitor 10 mg q.d. 7. Vioxx 25 mg q.d. 8. Nitroglycerin sublingual p.r.n.  LABORATORY DATA:  An electrocardiogram showed sinus bradycardia with incomplete right bundle branch block.  A chest x-ray showed no active disease.  White count 4.0, hemoglobin 12.2, hematocrit 34.4, platelets 154.  INR 0.8. Renal function showed sodium 139, potassium 3.5, BUN 10, creatinine 0.9. Liver function tests normal.  Troponin was 0.07.  CPK negative x 2.  CONDITION ON DISCHARGE:  The patient is discharged in stable condition.  FOLLOWUP:  He will follow up in our office in a couple of weeks.  He also has followup with Dr. Rito Ehrlich on September 13, 2000.Dictated by:   Abelino Derrick, Marland KitchenA.-C. LHC Attending Physician:  Ronaldo Miyamoto DD:  09/20/00 TD:  09/20/00 Job: [id number]

## 2010-05-29 NOTE — Cardiovascular Report (Signed)
[place]. [facility]  Patient:    [name], [name]                   MRN: [id number] Proc. Date: 07/20/00 Adm. Date:  [id number] Attending:  Ronaldo Miyamoto CC:         Jonell Cluck, M.D.  [name]. Riley Kill, M.D. [facility]   Cardiac Catheterization  PROCEDURES PERFORMED: 1. Left heart catheterization. 2. Selective coronary angiography. 3. Selective angiography of left internal mammary and prerenal bypass    grafts. 4. Percutaneous transluminal coronary angioplasty of the arteriovenous    circumflex.  DIAGNOSES: 1. Three-vessel coronary artery disease. 2. History of normal left ventricular systolic function.  INDICATIONS:  The patient is a 65 year old gentleman with advanced coronary artery disease who had previously undergone coronary artery bypass graft surgery in 1997 with placement of a LIMA graft to the distal LAD and free RIMA to the distal RCA.  The patient has also had percutaneous intervention to the large ramus intermedius branch with placement of two stents in the proximal section of the vessel, and most recently underwent PTCA of the distal ramus stent involving a bifurcation lesion.  This was was performed in June of 2002.  He did well postdischarge, however, has had recurrence of mild substernal chest discomfort occurring at rest which prompted his readmission to the hospital and he ruled out for acute myocardial infarction and presents now for relook angiography.  TECHNIQUE:  After informed consent was obtained, the patient was brought to the cardiac catheterization lab where a 6 French sheath was placed in the right femoral artery.  Left heart catheterization and selective angiography were then performed in the usual fashion using preformed 6 French Judkins catheters.  A left ventriculogram was not performed.  This was recently performed and showed normal LV function.  FINDINGS:  Initial findings are as follows: 1. Left main  trunk:  The left main trunk is a medium caliber vessel with mild    irregularities. 2. LAD:  This is a medium caliber vessel that provides the first diagonal    branch in the proximal segment and trivial second diagonal branch distally.    The LAD is occluded in the mid section and the distal vessel seemed to fill    via LIMA graft and has mild diffuse disease. 3. Left circumflex artery:  This is a medium caliber vessel that provides two    small marginal branches in its distal segment.  The AV circumflex has    sequential narrowings of 80% and 60% in the proximal AV segment.  There is    severe disease of 80% and 90% in the mid section of the small first    marginal branch.  The second marginal branch is a slightly larger caliber    vessel with mild diffuse irregularities. 4. Ramus intermedius:  This is a large caliber vessel that bifurcates in its    mid section.  There is a stent in the proximal segment of the ramus branch    which has mild in-stent re-stenosis of 30%.  The second stent encompassing    the bifurcation is also noted with only mild in-stent re-stenosis.  The    major segment of the marginal branch is widely patent as is the bifurcatiNG    branch.  There is only mild narrowing of 30% in the branch vessel. 5. Right coronary artery:  Right coronary artery is dominant.  This is a small    caliber  vessel that provides the posterior descending artery and trivial    posterior ventricular branch in its terminal segment.  The right coronary    artery has severe diffuse disease of 80-95% in the mid section.  The distal    vessel fills via a free RIMA graft. 6. LIMA to the LAD:  Patent. 7. Free RIMA to the distal right coronary artery is patent.  LV pressure is 142/70, aortic is 142/77.  LVEDP equals 22.  These findings were reviewed with the patient and it was felt that the patient was adequately revascularized.  There was evidence of severe disease in the AV circumflex which  appears to have progressed some from previous angiograms.  It is not clear that this was the cause of his discomfort.  The patient elected to proceed with percutaneous intervention to see if this may relieve his pain.  We gave the patient 5000 units of heparin to maintain the ACT of approximately 250 seconds and he had previously been treated with aspirin and Plavix.  A 6 [place] Voda left 4 guide catheter was used to engage the left coronary artery and a 0.014 extra-support wire advanced to the distal section of the marginal branch.  A 2.0 x 9 mm Maverick balloon was introduced and used to pre-dilate the lesion a 6 atmospheres for 60 seconds.  Repeat angiography after the administration of intracoronary nitroglycerin showed an excellent revealing mild residual narrowing of 30%.  The distal vessel was not much larger than the previously appreciated.  A 2.5 x 10 mm Cutting Balloon was then introduced and four inflations performed within the proximal AV circumflex at up to 6 atmospheres for 30 seconds.  An attempt was made to pass the Cutting Balloon into the small first marginal branch.  However, this proved unsuccessful and was felt best to leave this vessel alone.  Repeat angiography was performed after the administration of intracoronary nitroglycerin showing an excellent result with only mild residual narrowing of 10% in the AV circumflex.  There was no evidence of vessel damage and there was TIMI-3 flow through the circumflex system.  This was deemed acceptable result.  The patient had mild chest discomfort with balloon inflations that resolved at the end of the case. This pain was different than pain on presentation.  The guide catheter was then removed and the sheath secured in position.  The patient tolerated the procedure well and was transferred to the floor in stable condition.  FINAL RESULTS:  Successful percutaneous transluminal coronary angioplasty of the arteriovenous  circumflex with reduction of sequential 80% and 60%  narrowings to less than 10% with a 2.5 mm Cutting Balloon.  ASSESSMENT AND PLAN:  The patient is a 65 year old gentleman, who has advanced coronary artery disease.  At this point, continued medical therapy and risk factor modification will be pursued. DD:  07/20/00 TD:  07/20/00 Job: [id number]

## 2010-10-21 LAB — PROTIME-INR: INR: 0.9

## 2010-10-21 LAB — CK TOTAL AND CKMB (NOT AT ARMC)
CK, MB: 1.4
CK, MB: 1.5
Relative Index: 1
Relative Index: 1.1
Total CK: 130
Total CK: 139

## 2010-10-21 LAB — CBC
HCT: 33.9 — ABNORMAL LOW
HCT: 34.3 — ABNORMAL LOW
HCT: 35.7 — ABNORMAL LOW
Hemoglobin: 12.1 — ABNORMAL LOW
Hemoglobin: 12.1 — ABNORMAL LOW
Hemoglobin: 12.4 — ABNORMAL LOW
MCHC: 34.3
MCHC: 35.3
MCHC: 35.5
MCV: 97.8
MCV: 98.2
Platelets: 178
Platelets: 210
RBC: 3.47 — ABNORMAL LOW
RBC: 3.49 — ABNORMAL LOW
RBC: 3.55 — ABNORMAL LOW
RBC: 3.57 — ABNORMAL LOW
RDW: 12.9
RDW: 13
RDW: 13.1
WBC: 4.9
WBC: 5.1
WBC: 5.1

## 2010-10-21 LAB — COMPREHENSIVE METABOLIC PANEL
Albumin: 4.3
BUN: 14
Calcium: 9
Creatinine, Ser: 0.98
Total Bilirubin: 0.6
Total Protein: 7

## 2010-10-21 LAB — TROPONIN I: Troponin I: 0.02

## 2010-10-21 LAB — HEPARIN LEVEL (UNFRACTIONATED)
Heparin Unfractionated: 0.25 — ABNORMAL LOW
Heparin Unfractionated: 0.3

## 2010-10-21 LAB — APTT: aPTT: 27

## 2010-11-12 ENCOUNTER — Ambulatory Visit (INDEPENDENT_AMBULATORY_CARE_PROVIDER_SITE_OTHER): Payer: Medicare Other | Admitting: Otolaryngology

## 2010-11-12 DIAGNOSIS — K1120 Sialoadenitis, unspecified: Secondary | ICD-10-CM

## 2010-11-13 ENCOUNTER — Encounter: Payer: Self-pay | Admitting: *Deleted

## 2010-11-17 ENCOUNTER — Telehealth: Payer: Self-pay | Admitting: *Deleted

## 2010-11-17 ENCOUNTER — Ambulatory Visit: Payer: Self-pay | Admitting: Cardiology

## 2010-11-17 NOTE — Telephone Encounter (Signed)
I spoke with pt this afternoon and he is doing well.  His secretary was recently diagnosed with lung cancer and is no longer working. Pt says she usually calls him the day before to remind him of appointments.   appt rescheduled for pt. He denies any cardiac issues at this time. Mylo Red RN

## 2010-11-23 ENCOUNTER — Other Ambulatory Visit: Payer: Self-pay | Admitting: Cardiology

## 2010-12-06 ENCOUNTER — Other Ambulatory Visit: Payer: Self-pay | Admitting: Cardiology

## 2010-12-08 ENCOUNTER — Encounter: Payer: Self-pay | Admitting: Cardiology

## 2010-12-08 ENCOUNTER — Ambulatory Visit (INDEPENDENT_AMBULATORY_CARE_PROVIDER_SITE_OTHER): Payer: Medicare Other | Admitting: Cardiology

## 2010-12-08 DIAGNOSIS — I2510 Atherosclerotic heart disease of native coronary artery without angina pectoris: Secondary | ICD-10-CM | POA: Insufficient documentation

## 2010-12-08 DIAGNOSIS — E785 Hyperlipidemia, unspecified: Secondary | ICD-10-CM

## 2010-12-08 DIAGNOSIS — I25810 Atherosclerosis of coronary artery bypass graft(s) without angina pectoris: Secondary | ICD-10-CM

## 2010-12-08 MED ORDER — NITROGLYCERIN 0.4 MG SL SUBL: 0.4000 mg | SUBLINGUAL_TABLET | SUBLINGUAL | Status: DC | PRN

## 2010-12-08 NOTE — Progress Notes (Signed)
Patient ID: [name], male   DOB: 1945/08/10, 65 y.o.   MRN: [id number]

## 2010-12-08 NOTE — Patient Instructions (Addendum)
Your physician recommends that you continue on your current medications as directed. Please refer to the Current Medication list given to you today.  Your physician recommends that you schedule a follow-up appointment in: 3 MONTHS  The patient will review his medication list and call with any corrections.

## 2010-12-08 NOTE — Assessment & Plan Note (Signed)
Currently stable.  Episode of pain times one.  He has had these in the past.  Exercised the next day.  NTG refilled.  If he has more episodes he is to contact us.

## 2010-12-08 NOTE — Assessment & Plan Note (Signed)
See below.  He is on a minimal dose of prava, but pretty convinced that he has symptoms on higher doses.  Will continue to observe on the current dose.

## 2010-12-08 NOTE — Progress Notes (Signed)
HPI:  Patient is stable.  He had an episode of chest pain on the day after thanksgiving.  He went to the ER, but did not go in.  He took 5 nitros and the pain went away.  Of note, he then went to the gym the next day, and got his HR up, on the treadmill, and went over 130.  At that point, he did not have recurrent pain, and has felt well every since.  He was not short of breath or diaphoretic, and feels fine.  No current symptoms.   He also cut his statin, small dose as it is, in half.  He had sudden sharp cramps in his legs, which he thinks is from the statin.  He was using a bar of soap under his sheets, and rubbed it on his legs one day.  This seems to help.   Current Outpatient Prescriptions  Medication Sig Dispense Refill  . acetaminophen (TYLENOL) 500 MG tablet Take 500 mg by mouth every 6 (six) hours as needed.        Marland Kitchen amLODipine (NORVASC) 5 MG tablet Take 5 mg by mouth daily.        Marland Kitchen aspirin 81 MG tablet Take 81 mg by mouth daily.        . celecoxib (CELEBREX) 200 MG capsule Take 200 mg by mouth daily.        . clopidogrel (PLAVIX) 75 MG tablet Take 75 mg by mouth daily.        . Coenzyme Q10 (COQ10) 100 MG CAPS Take by mouth daily.        . colesevelam (WELCHOL) 625 MG tablet Take 1,875 mg by mouth. About 3 x a week       . levothyroxine (SYNTHROID, LEVOTHROID) 125 MCG tablet Take 125 mcg by mouth daily.        Marland Kitchen lisinopril (PRINIVIL,ZESTRIL) 5 MG tablet Take 5 mg by mouth daily.        . metoprolol tartrate (LOPRESSOR) 25 MG tablet TAKE ONE TABLET BY MOUTH TWICE DAILY  60 tablet  1  . Multiple Vitamin (MULTIVITAMIN) tablet Take 1 tablet by mouth daily.        . niacin 100 MG tablet 2 tabs po qd       . Omega-3 Fatty Acids (FISH OIL) 1200 MG CAPS Take by mouth 3 (three) times daily.        . pravastatin (PRAVACHOL) 10 MG tablet 1/2 tab qd        Allergies  Allergen Reactions  . Codeine     Past Medical History  Diagnosis Date  . Coronary artery disease   . Hypertension   .  Hyperlipidemia   . Hypothyroidism   . DJD (degenerative joint disease)   . Chronic back pain     Past Surgical History  Procedure Date  . Coronary artery bypass graft 1997    with (LIMA)  . Knee surgery     bilateral  . Hernia repair   . Robot assisted laparoscopic radical prostatectomy     Family History  Problem Relation Age of Onset  . Stroke Mother 44    died  . Cancer Father 3    died    History   Social History  . Marital Status: Married    Spouse Name: N/A    Number of Children: N/A  . Years of Education: N/A   Occupational History  . semi-retired    Social History Main Topics  . Smoking  status: Former Games developer  . Smokeless tobacco: Not on file   Comment: remote tobacco hx  . Alcohol Use: No  . Drug Use: No  . Sexually Active: Not on file   Other Topics Concern  . Not on file   Social History Narrative  . No narrative on file    ROS: Please see the HPI.  All other systems reviewed and negative.  PHYSICAL EXAM:  BP 126/79  Pulse 74  Wt 103.874 kg (229 lb)  General: Well developed, well nourished, in no acute distress. Head:  Normocephalic and atraumatic. Neck: no JVD Lungs: Clear to auscultation and percussion. Heart: Normal S1 and S2.  No murmur, rubs or gallops.  Abdomen:  Normal bowel sounds; soft; non tender; no organomegaly Pulses: Pulses normal in all 4 extremities. Extremities: No clubbing or cyanosis. No edema. Neurologic: Alert and oriented x 3.  EKG:  NSR.  Incomplete RBBB.  Probable old inferior MI.   ASSESSMENT AND PLAN:

## 2011-01-21 ENCOUNTER — Other Ambulatory Visit: Payer: Self-pay | Admitting: *Deleted

## 2011-01-21 MED ORDER — METOPROLOL TARTRATE 25 MG PO TABS: 25.0000 mg | ORAL_TABLET | Freq: Two times a day (BID) | ORAL | Status: DC

## 2011-01-25 ENCOUNTER — Encounter: Payer: Self-pay | Admitting: Internal Medicine

## 2011-01-27 ENCOUNTER — Telehealth (INDEPENDENT_AMBULATORY_CARE_PROVIDER_SITE_OTHER): Payer: Self-pay | Admitting: *Deleted

## 2011-01-27 NOTE — Telephone Encounter (Signed)
LM stating his last TCS was 10 years ago. Would like for someone to give him a call to schedule a TCS. The return phone numbers are [phone] or [phone].

## 2011-01-27 NOTE — Telephone Encounter (Signed)
Forwarded to Ann to address 

## 2011-01-28 NOTE — Telephone Encounter (Signed)
Patient not due until 09/2012, he is aware

## 2011-02-24 ENCOUNTER — Encounter: Payer: Medicare Other | Admitting: Internal Medicine

## 2011-03-07 ENCOUNTER — Other Ambulatory Visit: Payer: Self-pay | Admitting: Cardiology

## 2011-03-17 ENCOUNTER — Ambulatory Visit: Payer: Medicare Other | Admitting: Cardiology

## 2011-04-19 ENCOUNTER — Ambulatory Visit (INDEPENDENT_AMBULATORY_CARE_PROVIDER_SITE_OTHER): Payer: Medicare Other | Admitting: Cardiology

## 2011-04-19 ENCOUNTER — Encounter: Payer: Self-pay | Admitting: Cardiology

## 2011-04-19 VITALS — BP 140/74 | HR 68 | Ht 73.0 in | Wt 227.1 lb

## 2011-04-19 DIAGNOSIS — I10 Essential (primary) hypertension: Secondary | ICD-10-CM

## 2011-04-19 DIAGNOSIS — R011 Cardiac murmur, unspecified: Secondary | ICD-10-CM | POA: Insufficient documentation

## 2011-04-19 DIAGNOSIS — I25810 Atherosclerosis of coronary artery bypass graft(s) without angina pectoris: Secondary | ICD-10-CM

## 2011-04-19 DIAGNOSIS — I2510 Atherosclerotic heart disease of native coronary artery without angina pectoris: Secondary | ICD-10-CM

## 2011-04-19 DIAGNOSIS — E785 Hyperlipidemia, unspecified: Secondary | ICD-10-CM

## 2011-04-19 NOTE — Patient Instructions (Addendum)
Your physician has requested that you have an echocardiogram. Echocardiography is a painless test that uses sound waves to create images of your heart. It provides your doctor with information about the size and shape of your heart and how well your heart's chambers and valves are working. This procedure takes approximately one hour. There are no restrictions for this procedure.  Your physician recommends that you continue on your current medications as directed. Please refer to the Current Medication list given to you today.  Your physician recommends that you schedule a follow-up appointment in: 6 MONTHS

## 2011-04-19 NOTE — Assessment & Plan Note (Addendum)
No recurrent chest pain. Continue medical therapy. 

## 2011-04-19 NOTE — Assessment & Plan Note (Signed)
Patient has systolic ejection murmur, which is minimal, likely aortic sclerosis.  Will do 2 D echo. Doubt significant.

## 2011-04-19 NOTE — Assessment & Plan Note (Signed)
Doubtful niacin is of much benefit at this point.  He could stop.

## 2011-04-19 NOTE — Assessment & Plan Note (Signed)
Well controlled 

## 2011-04-19 NOTE — Progress Notes (Signed)
HPI:  Doing well.  No chest pain.  Has cut his statin down, and will use 2 Welchols when he eats a steak.  Has soft murmur that the PA heard.  Denies shortness of breath or exertional dyspnea.    Current Outpatient Prescriptions  Medication Sig Dispense Refill  . amLODipine (NORVASC) 5 MG tablet TAKE ONE TABLET BY MOUTH EVERY DAY  90 tablet  1  . aspirin 81 MG tablet Take 81 mg by mouth daily.        . celecoxib (CELEBREX) 200 MG capsule Take 200 mg by mouth daily.        . clopidogrel (PLAVIX) 75 MG tablet Take 75 mg by mouth daily.        . Coenzyme Q10 (COQ10) 100 MG CAPS Take by mouth daily.        . colesevelam (WELCHOL) 625 MG tablet Take 1,875 mg by mouth. About 5 x a week      . cyclobenzaprine (FLEXERIL) 10 MG tablet Take 10 mg by mouth as needed.      Marland Kitchen levothyroxine (SYNTHROID, LEVOTHROID) 125 MCG tablet Take 125 mcg by mouth daily.        Marland Kitchen lisinopril (PRINIVIL,ZESTRIL) 5 MG tablet TAKE ONE TABLET BY MOUTH EVERY DAY FOR BLOOD PRESSURE  90 tablet  2  . loratadine (CLARITIN) 10 MG tablet Take 10 mg by mouth daily.      . metoprolol tartrate (LOPRESSOR) 25 MG tablet Take 1 tablet (25 mg total) by mouth 2 (two) times daily.  60 tablet  6  . Multiple Vitamin (MULTIVITAMIN) tablet Take 1 tablet by mouth daily.        . niacin 100 MG tablet 2 tabs po qd       . nitroGLYCERIN (NITROSTAT) 0.4 MG SL tablet Place 1 tablet (0.4 mg total) under the tongue every 5 (five) minutes as needed for chest pain.  25 tablet  3  . Omega-3 Fatty Acids (FISH OIL) 1200 MG CAPS Take by mouth 2 (two) times daily.       . pravastatin (PRAVACHOL) 10 MG tablet 1/2 tab qd      . Red Yeast Rice 600 MG CAPS Take 600 mg by mouth daily.      . traMADol (ULTRAM) 50 MG tablet Take 50 mg by mouth as needed.         Allergies  Allergen Reactions  . Codeine     Past Medical History  Diagnosis Date  . Coronary artery disease   . Hypertension   . Hyperlipidemia   . Hypothyroidism   . DJD (degenerative joint  disease)   . Chronic back pain     Past Surgical History  Procedure Date  . Coronary artery bypass graft 1997    with (LIMA)  . Knee surgery     bilateral  . Hernia repair   . Robot assisted laparoscopic radical prostatectomy     Family History  Problem Relation Age of Onset  . Stroke Mother 59    died  . Cancer Father 75    died    History   Social History  . Marital Status: Married    Spouse Name: N/A    Number of Children: N/A  . Years of Education: N/A   Occupational History  . semi-retired    Social History Main Topics  . Smoking status: Former Games developer  . Smokeless tobacco: Not on file   Comment: remote tobacco hx  . Alcohol Use: No  .  Drug Use: No  . Sexually Active: Not on file   Other Topics Concern  . Not on file   Social History Narrative  . No narrative on file    ROS: Please see the HPI.  All other systems reviewed and negative.  PHYSICAL EXAM:  BP 140/74  Pulse 68  Ht 6\' 1"  (1.854 m)  Wt 227 lb 1.9 oz (103.021 kg)  BMI 29.96 kg/m2  General: Well developed, well nourished, in no acute distress. Head:  Normocephalic and atraumatic. Neck: no JVD Lungs: Clear to auscultation and percussion. Heart: Normal S1 and S2.  1/6 SEM.  No DM.   Pulses: Pulses normal in all 4 extremities. Extremities: No clubbing or cyanosis. No edema. Neurologic: Alert and oriented x 3.  EKG:  NSR.  One PVC.  Nonspecific T abnormality.   ASSESSMENT AND PLAN:

## 2011-04-22 ENCOUNTER — Ambulatory Visit (HOSPITAL_COMMUNITY)
Admission: RE | Admit: 2011-04-22 | Discharge: 2011-04-22 | Disposition: A | Discharge status: Home / Self Care | Payer: Medicare Other | Source: Ambulatory Visit | Attending: Cardiology | Admitting: Cardiology

## 2011-04-22 DIAGNOSIS — I10 Essential (primary) hypertension: Secondary | ICD-10-CM | POA: Insufficient documentation

## 2011-04-22 DIAGNOSIS — R011 Cardiac murmur, unspecified: Secondary | ICD-10-CM | POA: Insufficient documentation

## 2011-04-22 DIAGNOSIS — E785 Hyperlipidemia, unspecified: Secondary | ICD-10-CM

## 2011-04-22 DIAGNOSIS — I2510 Atherosclerotic heart disease of native coronary artery without angina pectoris: Secondary | ICD-10-CM

## 2011-04-22 DIAGNOSIS — I359 Nonrheumatic aortic valve disorder, unspecified: Secondary | ICD-10-CM

## 2011-04-22 NOTE — Progress Notes (Signed)
*  PRELIMINARY RESULTS* Echocardiogram 2D Echocardiogram has been performed.  [name] 04/22/2011, 1:52 PM

## 2011-04-29 ENCOUNTER — Telehealth: Payer: Self-pay | Admitting: Cardiology

## 2011-04-29 NOTE — Telephone Encounter (Signed)
PT CALLING RE ECHO RESULTS

## 2011-04-29 NOTE — Telephone Encounter (Signed)
Pt is aware ECHO from [facility] received for Dr. Riley Kill to review. Pt states it is okay to tell his wife the results of his ECHO at their home number tomorrow. Mylo Red RN

## 2011-05-05 ENCOUNTER — Telehealth: Payer: Self-pay | Admitting: Cardiology

## 2011-05-05 NOTE — Telephone Encounter (Signed)
New Problem:    Patient called in wanting to know the results of his latest ECHO that he had done in [place].  Please call back.

## 2011-05-05 NOTE — Telephone Encounter (Signed)
Dr called pt with results

## 2011-05-05 NOTE — Telephone Encounter (Signed)
Dr Riley Kill in office today, copy made and took to his desk for review.

## 2011-05-06 NOTE — Telephone Encounter (Signed)
I called the patient and discussed with him in detail, re: aortic sclerosis or mild stenosis.  TS

## 2011-05-11 ENCOUNTER — Other Ambulatory Visit (HOSPITAL_COMMUNITY): Payer: Self-pay | Admitting: Family Medicine

## 2011-05-11 ENCOUNTER — Ambulatory Visit (HOSPITAL_COMMUNITY)
Admission: RE | Admit: 2011-05-11 | Discharge: 2011-05-11 | Disposition: A | Discharge status: Home / Self Care | Payer: Medicare Other | Source: Ambulatory Visit | Attending: Family Medicine | Admitting: Family Medicine

## 2011-05-11 DIAGNOSIS — R079 Chest pain, unspecified: Secondary | ICD-10-CM | POA: Insufficient documentation

## 2011-05-11 DIAGNOSIS — C679 Malignant neoplasm of bladder, unspecified: Secondary | ICD-10-CM | POA: Insufficient documentation

## 2011-05-20 ENCOUNTER — Other Ambulatory Visit (HOSPITAL_COMMUNITY): Payer: Self-pay | Admitting: Family Medicine

## 2011-05-20 DIAGNOSIS — R079 Chest pain, unspecified: Secondary | ICD-10-CM

## 2011-05-24 ENCOUNTER — Ambulatory Visit (HOSPITAL_COMMUNITY)
Admission: RE | Admit: 2011-05-24 | Discharge: 2011-05-24 | Disposition: A | Discharge status: Home / Self Care | Payer: Medicare Other | Source: Ambulatory Visit | Attending: Family Medicine | Admitting: Family Medicine

## 2011-05-24 DIAGNOSIS — R918 Other nonspecific abnormal finding of lung field: Secondary | ICD-10-CM | POA: Insufficient documentation

## 2011-05-24 DIAGNOSIS — R079 Chest pain, unspecified: Secondary | ICD-10-CM

## 2011-05-24 LAB — POCT I-STAT, CHEM 8
BUN: 15 mg/dL (ref 6–23)
Calcium, Ion: 1.2 mmol/L (ref 1.12–1.32)
Chloride: 106 mEq/L (ref 96–112)
Glucose, Bld: 144 mg/dL — ABNORMAL HIGH (ref 70–99)

## 2011-05-24 MED ORDER — IOHEXOL 300 MG/ML  SOLN
80.0000 mL | Freq: Once | INTRAMUSCULAR | Status: AC | PRN
  Administered 2011-05-24: 80 mL via INTRAVENOUS

## 2011-08-04 ENCOUNTER — Other Ambulatory Visit: Payer: Self-pay | Admitting: Cardiology

## 2011-09-07 ENCOUNTER — Inpatient Hospital Stay (HOSPITAL_COMMUNITY)
Admission: EM | Admit: 2011-09-07 | Discharge: 2011-09-09 | DRG: 247 | Disposition: A | Discharge status: Home / Self Care | Payer: Medicare Other | Attending: Cardiology | Admitting: Cardiology

## 2011-09-07 ENCOUNTER — Encounter (HOSPITAL_COMMUNITY): Payer: Self-pay | Admitting: Emergency Medicine

## 2011-09-07 ENCOUNTER — Emergency Department (HOSPITAL_COMMUNITY): Payer: Medicare Other

## 2011-09-07 DIAGNOSIS — M1990 Unspecified osteoarthritis, unspecified site: Secondary | ICD-10-CM | POA: Diagnosis present

## 2011-09-07 DIAGNOSIS — E785 Hyperlipidemia, unspecified: Secondary | ICD-10-CM | POA: Diagnosis present

## 2011-09-07 DIAGNOSIS — I10 Essential (primary) hypertension: Secondary | ICD-10-CM | POA: Diagnosis present

## 2011-09-07 DIAGNOSIS — I2510 Atherosclerotic heart disease of native coronary artery without angina pectoris: Principal | ICD-10-CM

## 2011-09-07 DIAGNOSIS — E039 Hypothyroidism, unspecified: Secondary | ICD-10-CM | POA: Diagnosis present

## 2011-09-07 DIAGNOSIS — Z955 Presence of coronary angioplasty implant and graft: Secondary | ICD-10-CM

## 2011-09-07 DIAGNOSIS — I25810 Atherosclerosis of coronary artery bypass graft(s) without angina pectoris: Secondary | ICD-10-CM | POA: Diagnosis present

## 2011-09-07 DIAGNOSIS — I200 Unstable angina: Secondary | ICD-10-CM

## 2011-09-07 DIAGNOSIS — Z951 Presence of aortocoronary bypass graft: Secondary | ICD-10-CM

## 2011-09-07 DIAGNOSIS — R200 Anesthesia of skin: Secondary | ICD-10-CM

## 2011-09-07 DIAGNOSIS — R202 Paresthesia of skin: Secondary | ICD-10-CM

## 2011-09-07 DIAGNOSIS — R079 Chest pain, unspecified: Secondary | ICD-10-CM

## 2011-09-07 DIAGNOSIS — Z87891 Personal history of nicotine dependence: Secondary | ICD-10-CM

## 2011-09-07 DIAGNOSIS — Z9861 Coronary angioplasty status: Secondary | ICD-10-CM

## 2011-09-07 LAB — CBC WITH DIFFERENTIAL/PLATELET
Eosinophils Absolute: 0.3 10*3/uL (ref 0.0–0.7)
Eosinophils Relative: 6 % — ABNORMAL HIGH (ref 0–5)
HCT: 34.5 % — ABNORMAL LOW (ref 39.0–52.0)
Lymphocytes Relative: 49 % — ABNORMAL HIGH (ref 12–46)
Lymphs Abs: 2.3 10*3/uL (ref 0.7–4.0)
MCH: 33.8 pg (ref 26.0–34.0)
MCV: 98.9 fL (ref 78.0–100.0)
Monocytes Absolute: 0.5 10*3/uL (ref 0.1–1.0)
RBC: 3.49 MIL/uL — ABNORMAL LOW (ref 4.22–5.81)
WBC: 4.7 10*3/uL (ref 4.0–10.5)

## 2011-09-07 LAB — PROTIME-INR
INR: 0.97 (ref 0.00–1.49)
INR: 1 (ref 0.00–1.49)
Prothrombin Time: 13.1 seconds (ref 11.6–15.2)

## 2011-09-07 LAB — CBC
HCT: 36.6 % — ABNORMAL LOW (ref 39.0–52.0)
MCHC: 34.4 g/dL (ref 30.0–36.0)
MCV: 99.7 fL (ref 78.0–100.0)
RDW: 12.9 % (ref 11.5–15.5)

## 2011-09-07 LAB — CARDIAC PANEL(CRET KIN+CKTOT+MB+TROPI)
Relative Index: 1.1 (ref 0.0–2.5)
Total CK: 259 U/L — ABNORMAL HIGH (ref 7–232)
Troponin I: <0.3 ng/mL (ref <0.30)

## 2011-09-07 LAB — BASIC METABOLIC PANEL
Calcium: 9.7 mg/dL (ref 8.4–10.5)
Chloride: 102 mEq/L (ref 96–112)
GFR calc non Af Amer: 84 mL/min — ABNORMAL LOW (ref >90)

## 2011-09-07 LAB — POCT I-STAT TROPONIN I: Troponin i, poc: 0 ng/mL (ref 0.00–0.08)

## 2011-09-07 LAB — PRO B NATRIURETIC PEPTIDE: Pro B Natriuretic peptide (BNP): 28.3 pg/mL (ref 0–125)

## 2011-09-07 MED ORDER — ASPIRIN 81 MG PO TABS
81.0000 mg | ORAL_TABLET | Freq: Every day | ORAL | Status: DC
Start: 2011-09-07 — End: 2011-09-07

## 2011-09-07 MED ORDER — HEPARIN (PORCINE) IN NACL 100-0.45 UNIT/ML-% IJ SOLN
1000.0000 [IU]/h | INTRAMUSCULAR | Status: DC
  Administered 2011-09-08: 1000 [IU]/h via INTRAVENOUS
  Filled 2011-09-07: qty 250

## 2011-09-07 MED ORDER — CLOPIDOGREL BISULFATE 75 MG PO TABS
75.0000 mg | ORAL_TABLET | Freq: Every day | ORAL | Status: DC
  Administered 2011-09-08 – 2011-09-09 (×2): 75 mg via ORAL
  Filled 2011-09-07 (×2): qty 1

## 2011-09-07 MED ORDER — HEPARIN BOLUS VIA INFUSION
4000.0000 [IU] | Freq: Once | INTRAVENOUS | Status: AC
  Administered 2011-09-08: 4000 [IU] via INTRAVENOUS
  Filled 2011-09-07: qty 4000

## 2011-09-07 MED ORDER — ACETAMINOPHEN 325 MG PO TABS
650.0000 mg | ORAL_TABLET | ORAL | Status: DC | PRN
  Administered 2011-09-08 (×2): 650 mg via ORAL
  Filled 2011-09-07: qty 2

## 2011-09-07 MED ORDER — LORATADINE 10 MG PO TABS
10.0000 mg | ORAL_TABLET | Freq: Every morning | ORAL | Status: DC
  Administered 2011-09-08 – 2011-09-09 (×2): 10 mg via ORAL
  Filled 2011-09-07 (×2): qty 1

## 2011-09-07 MED ORDER — ADULT MULTIVITAMIN W/MINERALS CH
1.0000 | ORAL_TABLET | Freq: Every day | ORAL | Status: DC
  Administered 2011-09-08 – 2011-09-09 (×2): 1 via ORAL
  Filled 2011-09-07 (×2): qty 1

## 2011-09-07 MED ORDER — NITROGLYCERIN 0.4 MG SL SUBL: 0.4000 mg | SUBLINGUAL_TABLET | SUBLINGUAL | Status: DC | PRN

## 2011-09-07 MED ORDER — LEVOTHYROXINE SODIUM 125 MCG PO TABS
125.0000 ug | ORAL_TABLET | Freq: Every day | ORAL | Status: DC
  Administered 2011-09-08 – 2011-09-09 (×2): 125 ug via ORAL
  Filled 2011-09-07 (×3): qty 1

## 2011-09-07 MED ORDER — NITROGLYCERIN IN D5W 200-5 MCG/ML-% IV SOLN
2.0000 ug/min | INTRAVENOUS | Status: DC
  Administered 2011-09-08: 5 ug/min via INTRAVENOUS
  Filled 2011-09-07: qty 250

## 2011-09-07 MED ORDER — ONE-DAILY MULTI VITAMINS PO TABS: 1.0000 | ORAL_TABLET | ORAL | Status: DC

## 2011-09-07 MED ORDER — COQ10 100 MG PO CAPS: 1.0000 | ORAL_CAPSULE | Freq: Every day | ORAL | Status: DC

## 2011-09-07 MED ORDER — LISINOPRIL 5 MG PO TABS
5.0000 mg | ORAL_TABLET | Freq: Every day | ORAL | Status: DC
  Administered 2011-09-07 – 2011-09-08 (×2): 5 mg via ORAL
  Filled 2011-09-07 (×3): qty 1

## 2011-09-07 MED ORDER — SIMVASTATIN 5 MG PO TABS
5.0000 mg | ORAL_TABLET | Freq: Every day | ORAL | Status: DC
  Administered 2011-09-07: 5 mg via ORAL
  Filled 2011-09-07 (×3): qty 1

## 2011-09-07 MED ORDER — NIACIN 50 MG PO TABS
150.0000 mg | ORAL_TABLET | Freq: Every day | ORAL | Status: DC
  Administered 2011-09-08 (×2): 150 mg via ORAL
  Filled 2011-09-07 (×3): qty 1

## 2011-09-07 MED ORDER — ASPIRIN EC 81 MG PO TBEC
81.0000 mg | DELAYED_RELEASE_TABLET | Freq: Every day | ORAL | Status: DC
  Filled 2011-09-07: qty 1

## 2011-09-07 MED ORDER — ONDANSETRON HCL 4 MG/2ML IJ SOLN: 4.0000 mg | Freq: Four times a day (QID) | INTRAMUSCULAR | Status: DC | PRN

## 2011-09-07 MED ORDER — AMLODIPINE BESYLATE 5 MG PO TABS
5.0000 mg | ORAL_TABLET | Freq: Every morning | ORAL | Status: DC
  Administered 2011-09-08 – 2011-09-09 (×2): 5 mg via ORAL
  Filled 2011-09-07 (×2): qty 1

## 2011-09-07 MED ORDER — TRAMADOL HCL 50 MG PO TABS: 50.0000 mg | ORAL_TABLET | Freq: Four times a day (QID) | ORAL | Status: DC | PRN

## 2011-09-07 MED ORDER — CELECOXIB 200 MG PO CAPS
200.0000 mg | ORAL_CAPSULE | Freq: Every morning | ORAL | Status: DC
  Administered 2011-09-08 – 2011-09-09 (×2): 200 mg via ORAL
  Filled 2011-09-07 (×2): qty 1

## 2011-09-07 MED ORDER — METOPROLOL TARTRATE 25 MG PO TABS
25.0000 mg | ORAL_TABLET | Freq: Two times a day (BID) | ORAL | Status: DC
Start: 2011-09-07 — End: 2011-09-09
  Administered 2011-09-07 – 2011-09-09 (×4): 25 mg via ORAL
  Filled 2011-09-07 (×6): qty 1

## 2011-09-07 MED ORDER — ASPIRIN 81 MG PO CHEW
81.0000 mg | CHEWABLE_TABLET | Freq: Every day | ORAL | Status: AC
  Administered 2011-09-07 – 2011-09-08 (×2): 81 mg via ORAL
  Filled 2011-09-07 (×2): qty 1

## 2011-09-07 NOTE — ED Provider Notes (Cosign Needed)
History     CSN: [id number]  Arrival date & time 09/07/11  1512   First MD Initiated Contact with Patient 09/07/11 1750      Chief Complaint  Patient presents with  . Chest Pain    (Consider location/radiation/quality/duration/timing/severity/associated sxs/prior treatment) HPI Comments: Pt is a 66  Year old man who began to have numbness in the hands and swelling in the hands.  Yesterday he had tightness in the chest and in his jaw.  He took a total of 4 nitroglycerins, with relief of the chest tightness, but of the numbness.  He also gave himself a B12 shot that did not help.  This morning he again had chest pain and took nitroglycerin.  He saw his primary care physician, Assunta Found, M.D.in [place], [place], who advised him to go to the [facility] ED for further evaluation of chest pain.  Pt has a history of CABG in the 1990's, and has had five prior coronary stents.  He is followed by Shawnie Pons, M.D. [facility].  Patient is a 66 y.o. male presenting with chest pain.  Chest Pain The chest pain began yesterday. Duration of episode(s) is 10 minutes. Chest pain occurs intermittently. The chest pain is unchanged. Associated with: Nothing. At its most intense, the pain is at 6/10. The pain is currently at 0/10. The severity of the pain is moderate. The quality of the pain is described as sharp. The pain does not radiate. Pertinent negatives for primary symptoms include no fever, no shortness of breath, no cough, no palpitations, no nausea and no vomiting.  Associated symptoms include numbness. He tried nitroglycerin for the symptoms. Risk factors include male gender and being elderly (Known coronary artery disease.).  His past medical history is significant for CAD, hyperlipidemia and hypertension.  Pertinent negatives for past medical history include no MI.  Procedure history is positive for cardiac catheterization.     Past Medical History  Diagnosis Date  . Coronary  artery disease   . Hypertension   . Hyperlipidemia   . Hypothyroidism   . DJD (degenerative joint disease)   . Chronic back pain     Past Surgical History  Procedure Date  . Coronary artery bypass graft 1997    with (LIMA)  . Knee surgery     bilateral  . Hernia repair   . Robot assisted laparoscopic radical prostatectomy     Family History  Problem Relation Age of Onset  . Stroke Mother 75    died  . Cancer Father 66    died    History  Substance Use Topics  . Smoking status: Former Games developer  . Smokeless tobacco: Not on file   Comment: remote tobacco hx  . Alcohol Use: Yes      Review of Systems  Constitutional: Negative.  Negative for fever.  HENT: Negative.   Eyes: Negative.   Respiratory: Negative.  Negative for cough and shortness of breath.   Cardiovascular: Positive for chest pain. Negative for palpitations.  Gastrointestinal: Negative.  Negative for nausea and vomiting.  Genitourinary: Negative.   Musculoskeletal: Negative.   Skin: Negative.   Neurological: Positive for numbness.  Psychiatric/Behavioral: Negative.     Allergies  Codeine  Home Medications   Current Outpatient Rx  Name Route Sig Dispense Refill  . AMLODIPINE BESYLATE 5 MG PO TABS  TAKE ONE TABLET BY MOUTH EVERY DAY 90 tablet 1  . ASPIRIN 81 MG PO TABS Oral Take 81 mg by mouth daily.      Marland Kitchen  CELECOXIB 200 MG PO CAPS Oral Take 200 mg by mouth daily.      Marland Kitchen CLOPIDOGREL BISULFATE 75 MG PO TABS Oral Take 75 mg by mouth daily.      . COQ10 100 MG PO CAPS Oral Take by mouth daily.      . COLESEVELAM HCL 625 MG PO TABS Oral Take 1,875 mg by mouth. About 5 x a week    . CYCLOBENZAPRINE HCL 10 MG PO TABS Oral Take 10 mg by mouth as needed.    Marland Kitchen LEVOTHYROXINE SODIUM 125 MCG PO TABS Oral Take 125 mcg by mouth daily.      Marland Kitchen LISINOPRIL 5 MG PO TABS  TAKE ONE TABLET BY MOUTH EVERY DAY FOR BLOOD PRESSURE 90 tablet 2  . LORATADINE 10 MG PO TABS Oral Take 10 mg by mouth daily.    Marland Kitchen METOPROLOL  TARTRATE 25 MG PO TABS  TAKE ONE TABLET BY MOUTH TWICE DAILY 60 tablet 9  . ONE-DAILY MULTI VITAMINS PO TABS Oral Take 1 tablet by mouth daily.      Marland Kitchen NIACIN 100 MG PO TABS  2 tabs po qd     . NITROGLYCERIN 0.4 MG SL SUBL Sublingual Place 1 tablet (0.4 mg total) under the tongue every 5 (five) minutes as needed for chest pain. 25 tablet 3  . FISH OIL 1200 MG PO CAPS Oral Take by mouth 2 (two) times daily.     Marland Kitchen PRAVASTATIN SODIUM 10 MG PO TABS  1/2 tab qd    . RED YEAST RICE 600 MG PO CAPS Oral Take 600 mg by mouth daily.    . TRAMADOL HCL 50 MG PO TABS Oral Take 50 mg by mouth as needed.       BP 160/70  Temp 98.6 F (37 C) (Oral)  Resp 18  SpO2 97%  Physical Exam  Nursing note and vitals reviewed. Constitutional: He appears well-developed and well-nourished. No distress.  HENT:  Head: Normocephalic and atraumatic.  Right Ear: External ear normal.  Left Ear: External ear normal.  Mouth/Throat: Oropharynx is clear and moist.  Eyes: Conjunctivae and EOM are normal. Pupils are equal, round, and reactive to light.  Neck: Normal range of motion. Neck supple.  Cardiovascular: Normal rate, regular rhythm and normal heart sounds.   Pulmonary/Chest: Effort normal and breath sounds normal.  Abdominal: Soft. Bowel sounds are normal.  Musculoskeletal: Normal range of motion. He exhibits no edema and no tenderness.  Neurological: He is alert.       No sensory or motor deficit.    Skin: Skin is warm and dry.  Psychiatric: He has a normal mood and affect. His behavior is normal.    ED Course  Procedures (including critical care time)  Labs Reviewed  CBC - Abnormal; Notable for the following:    RBC 3.67 (*)     Hemoglobin 12.6 (*)     HCT 36.6 (*)     MCH 34.3 (*)     All other components within normal limits  POCT I-STAT TROPONIN I  BASIC METABOLIC PANEL  PROTIME-INR   Dg Chest 2 View  09/07/2011  *RADIOLOGY REPORT*  Clinical Data: Chest pain, shortness of breath  CHEST - 2  VIEW  Comparison: CT chest dated 05/24/2011  Findings: Lungs are essentially clear. No pleural effusion or pneumothorax.  Cardiomediastinal silhouette is within normal limits. Postsurgical changes related to prior CABG.  Mild degenerative changes of the visualized thoracolumbar spine.  IMPRESSION: No evidence of acute cardiopulmonary  disease.   Original Report Authenticated By: Charline Bills, M.D.     Date: 09/07/2011  Rate: 61  Rhythm: normal sinus rhythm  QRS Axis: normal  Intervals: normal  ST/T Wave abnormalities: normal  Conduction Disutrbances:  Incomplete right bundle branch block.   Narrative Interpretation: Borderline EKG  Old EKG Reviewed: unchanged  Results for orders placed during the hospital encounter of 09/07/11  CBC      Component Value Range   WBC 5.4  4.0 - 10.5 K/uL   RBC 3.67 (*) 4.22 - 5.81 MIL/uL   Hemoglobin 12.6 (*) 13.0 - 17.0 g/dL   HCT 40.9 (*) 81.1 - 91.4 %   MCV 99.7  78.0 - 100.0 fL   MCH 34.3 (*) 26.0 - 34.0 pg   MCHC 34.4  30.0 - 36.0 g/dL   RDW 78.2  95.6 - 21.3 %   Platelets 155  150 - 400 K/uL  POCT I-STAT TROPONIN I      Component Value Range   Troponin i, poc 0.00  0.00 - 0.08 ng/mL   Comment 3            Dg Chest 2 View  09/07/2011  *RADIOLOGY REPORT*  Clinical Data: Chest pain, shortness of breath  CHEST - 2 VIEW  Comparison: CT chest dated 05/24/2011  Findings: Lungs are essentially clear. No pleural effusion or pneumothorax.  Cardiomediastinal silhouette is within normal limits. Postsurgical changes related to prior CABG.  Mild degenerative changes of the visualized thoracolumbar spine.  IMPRESSION: No evidence of acute cardiopulmonary disease.   Original Report Authenticated By: Charline Bills, M.D.     6:35 PM Pt was seen and had physical examination.  EKG was non-acute.  CBC  And TNI were normal.  BMET is pending.    8:19 PM Pt's BMET was normal.  Case discussed with Dr. Armanda Magic, who will see and admit pt.     1. Chest pain    2. Coronary artery disease   3. Numbness and tingling in hands        Carleene Cooper III, MD 09/07/11 2021

## 2011-09-07 NOTE — H&P (Signed)
Admit date: 09/07/2011 Referring Physician  Dr. Ignacia Palma Primary Cardiologist Dr. Riley Kill Chief complaint/reason for admission:  Chest pain  HPI: This is an 66yo WM with a history of CAD and remote CABG in 1997 with several PCI's since then who was in his usual state of health until about 10 days ago when he started having pain in his elbow and fingers on both sides but worse on the right and had edema of his hands and feet.  He thought it was a flare of his arthritis and took some Aleve with no relief.  Sunday PM he took a B12 shot with only minimal improvement.  He says it hurts to make a fist.  He ankles have started hurting as well.  He has also noticed midsternal mild pressure which started yesterday and was off and on through last night.  He took 2 SL NTG around 8PM with no improvement and waited 30 minutes and took 2 more and seemed to ease off.  When he went to bed he took Niacin.  He awakened this am and felt ok.  At 11am he felt more pressure and eased up with 2 SL NTG.  At [facility] he went to his primary MD and had recurrent CP and was subsequently sent to the ER.  Currently he complains of 0.5/10 CP.  He states that his friend commented that he appeared SOB exerting himself last PM.  He denies any diaphoresis or nausea.    PMH:    Past Medical History  Diagnosis Date  . Coronary artery disease     s/p CABG and multiple PCI's since CABG  . Hypertension   . Hyperlipidemia   . Hypothyroidism   . DJD (degenerative joint disease)   . Chronic back pain     PSH:    Past Surgical History  Procedure Date  . Coronary artery bypass graft 1997    with (LIMA)  . Knee surgery     bilateral  . Hernia repair   . Robot assisted laparoscopic radical prostatectomy     ALLERGIES:   Codeine  Prior to Admit Meds:   (Not in a hospital admission) Family HX:    Family History  Problem Relation Age of Onset  . Stroke Mother 5    died  . Cancer Father 63    died   Social HX:    History    Social History  . Marital Status: Married    Spouse Name: N/A    Number of Children: N/A  . Years of Education: N/A   Occupational History  . semi-retired    Social History Main Topics  . Smoking status: Former Games developer  . Smokeless tobacco: Not on file   Comment: remote tobacco hx  . Alcohol Use: Yes  . Drug Use: No  . Sexually Active: Not on file   Other Topics Concern  . Not on file   Social History Narrative  . No narrative on file     ROS:  All 11 ROS were addressed and are negative except what is stated in the HPI  PHYSICAL EXAM Filed Vitals:   09/07/11 1839  BP: 168/72  Pulse: 60  Temp:   Resp: 18   General: Well developed, well nourished, in no acute distress Head: Eyes PERRLA, No xanthomas.   Normal cephalic and atramatic  Lungs:   Clear bilaterally to auscultation and percussion. Heart:   HRRR S1 S2 Pulses are 2+ & equal.  No carotid bruit. No JVD.  No abdominal bruits. No femoral bruits. Abdomen: Bowel sounds are positive, abdomen soft and non-tender without masses Extremities:   No clubbing, cyanosis or edema.  DP +1 Neuro: Alert and oriented X 3. Psych:  Good affect, responds appropriately   Labs:   Lab Results  Component Value Date   WBC 5.4 09/07/2011   HGB 12.6* 09/07/2011   HCT 36.6* 09/07/2011   MCV 99.7 09/07/2011   PLT 155 09/07/2011    Lab 09/07/11 1753  NA 139  K 4.1  CL 102  CO2 26  BUN 12  CREATININE 0.95  CALCIUM 9.7  PROT --  BILITOT --  ALKPHOS --  ALT --  AST --  GLUCOSE 98   Lab Results  Component Value Date   CKTOTAL 125 11/05/2006   CKMB 1.2 11/05/2006   TROPONINI  Value: 0.02        NO INDICATION OF MYOCARDIAL INJURY. 11/05/2006   No results found for this basename: PTT   Lab Results  Component Value Date   INR 0.97 09/07/2011   INR 0.9 11/04/2006         Radiology: *RADIOLOGY REPORT*  Clinical Data: Chest pain, shortness of breath  CHEST - 2 VIEW  Comparison: CT chest dated 05/24/2011   Findings: Lungs are essentially clear. No pleural effusion or  pneumothorax.  Cardiomediastinal silhouette is within normal limits. Postsurgical  changes related to prior CABG.  Mild degenerative changes of the visualized thoracolumbar spine.  IMPRESSION:  No evidence of acute cardiopulmonary disease.  Original Report Authenticated By: Charline Bills, M.D.   EKG:  NSR, IRBBB, nonspecific T wave abnormality  ASSESSMENT:  1.  Acute coronary syndrome currently with minimal pain and initial troponin negative.  No acute changes on EKG. 2.  CAD with remote CABG and multiple PCI's 3.  HTN - BP elevated 4.  Dyslipidemia 5.  DJD 6.  Chronic back pain 7.  Hypothyroidism 8.  Worsening edema and increased weight   PLAN:   1.  Admit to tele bed 2.  Cycle cardiac enzymes 3.  IV Heparin gtt per pharmacy 4.  IV NTG gtt titrate for CP 5.  Continue ASA/Plavix/beta blocker/ACE I/statin 6.  NPO after midnight 7.  Check BNP due to complaints of edema 7.  Probable cath in am  Quintella Reichert, MD  09/07/2011  8:53 PM

## 2011-09-07 NOTE — ED Notes (Signed)
Pt c/o mid sternal CP with pain in both arms 2 days; pt sts pain and numbness in arms and legs x 10 days

## 2011-09-07 NOTE — Progress Notes (Signed)
ANTICOAGULATION CONSULT NOTE - Initial Consult  Pharmacy Consult for Heparin  Indication: chest pain/ACS  Allergies  Allergen Reactions  . Codeine Nausea And Vomiting    Patient Measurements:   Heparin Dosing Weight:   Vital Signs: Temp: 98.6 F (37 C) (08/27 1519) Temp src: Oral (08/27 1519) BP: 166/79 mmHg (08/27 2100) Pulse Rate: 59  (08/27 2100)  Labs:  Basename 09/07/11 1753  HGB 12.6*  HCT 36.6*  PLT 155  APTT --  LABPROT 13.1  INR 0.97  HEPARINUNFRC --  CREATININE 0.95  CKTOTAL --  CKMB --  TROPONINI --    The CrCl is unknown because both a height and weight (above a minimum accepted value) are required for this calculation.   Medical History: Past Medical History  Diagnosis Date  . Coronary artery disease     s/p CABG and multiple PCI's since CABG  . Hypertension   . Hyperlipidemia   . Hypothyroidism   . DJD (degenerative joint disease)   . Chronic back pain     Assessment: 66yom with Hx CAD/CABG admitted with chest pain.  CBC stable, INR 0.97, renal function stable.  Plan is to anticoagulate with heparin and cath tomorrow.   Goal of Therapy:  Heparin level 0.3-0.7 units/ml Monitor platelets by anticoagulation protocol: Yes   Plan:   Heparin bolus 4000 uts IV x1 Heparin drip 1000 uts/hr  Daily CBC, HL    Marcelino Scot 09/07/2011,9:39 PM

## 2011-09-08 ENCOUNTER — Encounter (HOSPITAL_COMMUNITY)
Admission: EM | Disposition: A | Discharge status: Home / Self Care | Payer: Self-pay | Source: Home / Self Care | Attending: Cardiology

## 2011-09-08 DIAGNOSIS — R079 Chest pain, unspecified: Secondary | ICD-10-CM

## 2011-09-08 DIAGNOSIS — I2510 Atherosclerotic heart disease of native coronary artery without angina pectoris: Secondary | ICD-10-CM

## 2011-09-08 HISTORY — PX: LEFT HEART CATHETERIZATION WITH CORONARY ANGIOGRAM: SHX5451

## 2011-09-08 HISTORY — PX: PERCUTANEOUS CORONARY STENT INTERVENTION (PCI-S): SHX5485

## 2011-09-08 LAB — CBC
Hemoglobin: 11.7 g/dL — ABNORMAL LOW (ref 13.0–17.0)
MCH: 33.8 pg (ref 26.0–34.0)
MCV: 98.8 fL (ref 78.0–100.0)
RBC: 3.46 MIL/uL — ABNORMAL LOW (ref 4.22–5.81)
WBC: 5.1 10*3/uL (ref 4.0–10.5)

## 2011-09-08 LAB — BASIC METABOLIC PANEL
BUN: 14 mg/dL (ref 6–23)
GFR calc Af Amer: 86 mL/min — ABNORMAL LOW (ref >90)
GFR calc non Af Amer: 74 mL/min — ABNORMAL LOW (ref >90)
Potassium: 4.1 mEq/L (ref 3.5–5.1)
Sodium: 138 mEq/L (ref 135–145)

## 2011-09-08 LAB — CARDIAC PANEL(CRET KIN+CKTOT+MB+TROPI)
Relative Index: 1.2 (ref 0.0–2.5)
Relative Index: 1.2 (ref 0.0–2.5)
Total CK: 223 U/L (ref 7–232)
Troponin I: <0.3 ng/mL (ref <0.30)

## 2011-09-08 LAB — COMPREHENSIVE METABOLIC PANEL
BUN: 13 mg/dL (ref 6–23)
CO2: 25 mEq/L (ref 19–32)
Calcium: 9.4 mg/dL (ref 8.4–10.5)
Creatinine, Ser: 0.89 mg/dL (ref 0.50–1.35)
GFR calc Af Amer: >90 mL/min (ref >90)
GFR calc non Af Amer: 86 mL/min — ABNORMAL LOW (ref >90)
Glucose, Bld: 168 mg/dL — ABNORMAL HIGH (ref 70–99)
Total Protein: 6.8 g/dL (ref 6.0–8.3)

## 2011-09-08 LAB — HEPARIN LEVEL (UNFRACTIONATED): Heparin Unfractionated: 0.16 IU/mL — ABNORMAL LOW (ref 0.30–0.70)

## 2011-09-08 SURGERY — LEFT HEART CATHETERIZATION WITH CORONARY ANGIOGRAM
Anesthesia: LOCAL | Site: Groin | Laterality: Right

## 2011-09-08 MED ORDER — SODIUM CHLORIDE 0.9 % IV SOLN: 250.0000 mL | INTRAVENOUS | Status: DC

## 2011-09-08 MED ORDER — ACETAMINOPHEN 325 MG PO TABS: 650.0000 mg | ORAL_TABLET | ORAL | Status: DC | PRN

## 2011-09-08 MED ORDER — SODIUM CHLORIDE 0.9 % IJ SOLN: 3.0000 mL | INTRAMUSCULAR | Status: DC | PRN

## 2011-09-08 MED ORDER — SODIUM CHLORIDE 0.9 % IV SOLN: 1.0000 mL/kg/h | INTRAVENOUS | Status: DC

## 2011-09-08 MED ORDER — SODIUM CHLORIDE 0.9 % IV SOLN: 250.0000 mL | INTRAVENOUS | Status: DC | PRN

## 2011-09-08 MED ORDER — SODIUM CHLORIDE 0.9 % IV SOLN: 1.7500 mg/kg/h | INTRAVENOUS | Status: DC

## 2011-09-08 MED ORDER — ASPIRIN 81 MG PO CHEW: 324.0000 mg | CHEWABLE_TABLET | ORAL | Status: DC

## 2011-09-08 MED ORDER — SODIUM CHLORIDE 0.9 % IJ SOLN: 3.0000 mL | Freq: Two times a day (BID) | INTRAMUSCULAR | Status: DC

## 2011-09-08 MED ORDER — HEPARIN (PORCINE) IN NACL 100-0.45 UNIT/ML-% IJ SOLN
1550.0000 [IU]/h | INTRAMUSCULAR | Status: DC
  Administered 2011-09-08: 1550 [IU]/h via INTRAVENOUS
  Filled 2011-09-08 (×3): qty 250

## 2011-09-08 MED ORDER — ONDANSETRON HCL 4 MG/2ML IJ SOLN: 4.0000 mg | Freq: Four times a day (QID) | INTRAMUSCULAR | Status: DC | PRN

## 2011-09-08 MED ORDER — HEPARIN (PORCINE) IN NACL 2-0.9 UNIT/ML-% IJ SOLN
INTRAMUSCULAR | Status: AC
  Filled 2011-09-08: qty 2000

## 2011-09-08 MED ORDER — SODIUM CHLORIDE 0.9 % IJ SOLN
3.0000 mL | Freq: Two times a day (BID) | INTRAMUSCULAR | Status: DC
  Administered 2011-09-08: 3 mL via INTRAVENOUS

## 2011-09-08 MED ORDER — FENTANYL CITRATE 0.05 MG/ML IJ SOLN
INTRAMUSCULAR | Status: AC
  Filled 2011-09-08: qty 2

## 2011-09-08 MED ORDER — BIVALIRUDIN BOLUS VIA INFUSION: 0.1000 mg/kg | Freq: Once | INTRAVENOUS | Status: DC

## 2011-09-08 MED ORDER — HEPARIN BOLUS VIA INFUSION
4000.0000 [IU] | Freq: Once | INTRAVENOUS | Status: AC
  Administered 2011-09-08: 4000 [IU] via INTRAVENOUS
  Filled 2011-09-08: qty 4000

## 2011-09-08 MED ORDER — SODIUM CHLORIDE 0.9 % IV SOLN: 1.0000 mL/kg/h | INTRAVENOUS | Status: AC

## 2011-09-08 MED ORDER — NITROGLYCERIN 0.2 MG/ML ON CALL CATH LAB
INTRAVENOUS | Status: AC
  Filled 2011-09-08: qty 1

## 2011-09-08 MED ORDER — LIDOCAINE HCL (PF) 1 % IJ SOLN
INTRAMUSCULAR | Status: AC
  Filled 2011-09-08: qty 30

## 2011-09-08 MED ORDER — MIDAZOLAM HCL 2 MG/2ML IJ SOLN
INTRAMUSCULAR | Status: AC
  Filled 2011-09-08: qty 2

## 2011-09-08 MED ORDER — ASPIRIN 81 MG PO CHEW: 81.0000 mg | CHEWABLE_TABLET | Freq: Every day | ORAL | Status: DC

## 2011-09-08 MED ORDER — DIAZEPAM 5 MG PO TABS
5.0000 mg | ORAL_TABLET | ORAL | Status: AC
  Administered 2011-09-08: 5 mg via ORAL
  Filled 2011-09-08: qty 1

## 2011-09-08 NOTE — Progress Notes (Signed)
Subjective:  Stable.  No recurrent pain.  Symptoms are recently more suggestive.  Discussed with Dr. Turner last pm, and with patient this am.    Objective:  Vital Signs in the last 24 hours: Temp:  [97.6 F (36.4 C)-98.6 F (37 C)] 98 F (36.7 C) (08/28 0420) Pulse Rate:  [59-73] 63  (08/28 0931) Resp:  [13-21] 18  (08/28 0420) BP: (104-168)/(64-79) 145/78 mmHg (08/28 0931) SpO2:  [95 %-100 %] 95 % (08/28 0420) Weight:  [227 lb 1.6 oz (103.012 kg)-230 lb 14.4 oz (104.736 kg)] 230 lb 14.4 oz (104.736 kg) (08/28 0420)  Intake/Output from previous day: 08/27 0701 - 08/28 0700 In: 240 [P.O.:240] Out: -    Physical Exam: General: Well developed, well nourished, in no acute distress. Head:  Normocephalic and atraumatic. Lungs: Clear to auscultation and percussion. Heart: Normal S1 and S2.  No murmur, rubs or gallops.  Pulses: Pulses normal in all 4 extremities. Extremities: No clubbing or cyanosis. No edema. Neurologic: Alert and oriented x 3.    Lab Results:  Basename 09/08/11 0402 09/07/11 2250  WBC 5.1 4.7  HGB 11.7* 11.8*  PLT 141* 145*    Basename 09/08/11 0402 09/07/11 2250  NA 138 137  K 4.1 3.1*  CL 103 102  CO2 27 25  GLUCOSE 97 168*  BUN 14 13  CREATININE 1.01 0.89    Basename 09/08/11 0406 09/07/11 2250  TROPONINI <0.30 <0.30   Hepatic Function Panel  Basename 09/07/11 2250  PROT 6.8  ALBUMIN 3.7  AST 26  ALT 30  ALKPHOS 61  BILITOT 0.4  BILIDIR --  IBILI --   No results found for this basename: CHOL in the last 72 hours No results found for this basename: PROTIME in the last 72 hours  Imaging: Dg Chest 2 View  09/07/2011  *RADIOLOGY REPORT*  Clinical Data: Chest pain, shortness of breath  CHEST - 2 VIEW  Comparison: CT chest dated 05/24/2011  Findings: Lungs are essentially clear. No pleural effusion or pneumothorax.  Cardiomediastinal silhouette is within normal limits. Postsurgical changes related to prior CABG.  Mild degenerative changes  of the visualized thoracolumbar spine.  IMPRESSION: No evidence of acute cardiopulmonary disease.   Original Report Authenticated By: SRIYESH KRISHNAN, M.D.     EKG:  NSR.  IRBBB.   Cardiac Studies:  See albs.   Assessment/Plan:  1.  Chest pain with prior CABG--worrisome for USAP.  Last cath was 2008.  He thinks we should proceed with cath, and I am inclined to do so based on 2008 data.  He is agreeable and we will make plans for restudy. He may or may not get done today as the board is busy.         Steve Dobie, MD, FACC, FSCAI 09/08/2011, 9:38 AM    

## 2011-09-08 NOTE — CV Procedure (Signed)
Cardiac Catheterization Procedure Note  Name: [name] MRN: [id number] DOB: 08-11-1945  Procedure: Left Heart Cath, Selective Coronary Angiography, LV angiography,  PTCA/Stent of the Left Main into the Ramus Intermedius, PerClose of the RFA  Indication: Unstable Angina. 66 year old gentleman with known CAD status post CABG and multiple PCI procedures. He has episodic chronic angina. However, he has noted progressive symptoms over the last few days with dyspnea on exertion has a past anginal equivalent. Dyspnea and chest tightness have been with minimal activity. He was admitted with unstable angina and referred for cardiac catheterization and possible PCI.   Diagnostic Procedure Details: The right groin was prepped, draped, and anesthetized with 1% lidocaine. Using the modified Seldinger technique, a 5 French sheath was introduced into the right femoral artery. Standard Judkins catheters were used for selective coronary angiography and left ventriculography. LIMA and free RIMA angiography were performed with the JR 4 catheter. Catheter exchanges were performed over a wire.  The diagnostic procedure was well-tolerated without immediate complications.  PROCEDURAL FINDINGS Hemodynamics: AO 150/74 LV 153/19  Coronary angiography: Coronary dominance: right  Left mainstem: The left main is moderately calcified. There is diffuse plaque in the proximal and mid shaft of the left main and the vessel then tapers distally into a 70% distal left main stenosis. This is within a previously implanted stent which is difficult to visualize. There is a complex area just beyond the left main trifurcation leading into the ramus intermedius which has severe 80-90% stenosis, clearly progressive compared to the previous study from 2008.  Left anterior descending (LAD): The LAD is severely diseased. It occludes just after the first diagonal.  Left circumflex (LCx): The AV groove circumflex is patent. There  is an intermediate branch which has been heavily stented. There is a long stented segment that has diffuse 50% stenosis. There is an overlap stent extending beyond the easily visible stent that also has diffuse 30-40% stenosis. Distally the vessel bifurcates into twin branches and has 60% stenosis into both branches. This is unchanged from the previous study. The area of severe stenosis occurs at the proximal portion of the intermediate branch and this is in an area of previously overlapped drug-eluting and bare-metal stent. The AV groove circumflex has no significant disease  Right coronary artery (RCA): The RCA is severely diseased until it occludes in its midportion.  LIMA to LAD: The vessel is widely patent. The distal anastomotic site is patent. The mid and distal LAD have diffuse mild to moderate plaque without high-grade focal stenosis.  Free RIMA to distal RCA is patent. There is no obstructive disease throughout the graft. The distal anastomotic site is widely patent. The PDA and posterolateral branches are patent.  Left ventriculography: Left ventricular systolic function is normal, LVEF is estimated at 55-65%, there is no significant mitral regurgitation   PCI Procedure Note:  Following the diagnostic procedure, the decision was made to proceed with PCI. The sheath was upsized to a 6 [place]. Weight-based bivalirudin was given for anticoagulation. Once a therapeutic ACT was achieved, a 6 [place] XB LAD 3.5 cm guide catheter was inserted.  A cougar coronary guidewire was used to cross the lesion.  The lesion was predilated with a 3.0 x 15 mm balloon.  The lesion was then stented with a 3.5 x 18 mm Xience Xpedition drug-eluting stent.  Care was taken to treat back into the left main and cover the entire area of overlap into the ramus. The stent was postdilated with a 4.0x15 mm  noncompliant balloon to 16 atm.  Following PCI, there was 0% residual stenosis and TIMI-3 flow. Final angiography confirmed  an excellent result. Femoral hemostasis was achieved with a PerClose device.  The patient tolerated the PCI procedure well. There were no immediate procedural complications.  The patient was transferred to the post catheterization recovery area for further monitoring.  PCI Data: Vessel - left main/Segment - distal, extending into the ramus intermedius Percent Stenosis (pre)  80 TIMI-flow 3 Stent 3.5 x 18 mm drug-eluting Percent Stenosis (post) 0 TIMI-flow (post) 3  Final Conclusions:    1. Severe native three-vessel coronary artery disease 2. Status post coronary bypass surgery with continued patency of the LIMA to LAD and free RIMA to RCA 3. Preserved LV systolic function 4. Successful PCI of the distal left main into the ramus intermedius  Recommendations: Continued dual antiplatelet therapy with aspirin and Plavix indefinitely  [name] 09/08/2011, 5:49 PM

## 2011-09-08 NOTE — Progress Notes (Signed)
ANTICOAGULATION CONSULT NOTE - Initial Consult  Pharmacy Consult for Heparin  Indication: chest pain/ACS  Allergies  Allergen Reactions  . Codeine Nausea And Vomiting    Patient Measurements: Height: 6\' 1"  (185.4 cm) Weight: 230 lb 14.4 oz (104.736 kg) IBW/kg (Calculated) : 79.9  Heparin Dosing Weight:   Vital Signs: Temp: 98 F (36.7 C) (08/28 0420) Temp src: Oral (08/28 0420) BP: 104/64 mmHg (08/28 0420) Pulse Rate: 67  (08/28 0420)  Labs:  Basename 09/08/11 0406 09/08/11 0402 09/07/11 2250 09/07/11 1753  HGB -- 11.7* 11.8* --  HCT -- 34.2* 34.5* 36.6*  PLT -- 141* 145* 155  APTT -- -- 31 --  LABPROT -- -- 13.4 13.1  INR -- -- 1.00 0.97  HEPARINUNFRC -- <0.10* -- --  CREATININE -- 1.01 0.89 0.95  CKTOTAL 223 -- 259* --  CKMB 2.6 -- 2.9 --  TROPONINI <0.30 -- <0.30 --    Estimated Creatinine Clearance: 88.9 ml/min (by C-G formula based on Cr of 1.01).   Medical History: Past Medical History  Diagnosis Date  . Coronary artery disease     s/p CABG and multiple PCI's since CABG  . Hypertension   . Hyperlipidemia   . Hypothyroidism   . DJD (degenerative joint disease)   . Chronic back pain     Assessment: 66yom with Hx CAD/CABG admitted with chest pain.  CBC stable, INR 0.97, renal function stable.  Plan is to anticoagulate with heparin and cath today.  Initial heparin level <0.1 units/ml. Goal of Therapy:  Heparin level 0.3-0.7 units/ml Monitor platelets by anticoagulation protocol: Yes   Plan:   Heparin bolus 4000 uts IV x1 Heparin drip increase to 1250 uts/hr  Check heparin level in 8 hours and/or f/u after cath.    Freida Nebel Poteet 09/08/2011,5:33 AM

## 2011-09-08 NOTE — Care Management Note (Unsigned)
    Page 1 of 1   09/08/2011     9:29:10 AM   CARE MANAGEMENT NOTE 09/08/2011  Patient:  [name], [name]   Account Number:  [id number]  Date Initiated:  09/08/2011  Documentation initiated by:  SIMMONS,Dariela Stoker  Subjective/Objective Assessment:   ADMITTED WITH CP; LIVES AT HOME WITH WIFE- [name]; WAS IPTA; HAS CANE AT HOME; USES [employer] IN [place] FOR RX.     Action/Plan:   DISCHARGE PLANNING DISCUSSED AT BEDSIDE.   Anticipated DC Date:  09/09/2011   Anticipated DC Plan:  HOME/SELF CARE      DC Planning Services  CM consult      Choice offered to / List presented to:             Status of service:  In process, will continue to follow Medicare Important Message given?   (If response is "NO", the following Medicare IM given date fields will be blank) Date Medicare IM given:   Date Additional Medicare IM given:    Discharge Disposition:    Per UR Regulation:  Reviewed for med. necessity/level of care/duration of stay  If discussed at Long Length of Stay Meetings, dates discussed:    Comments:  09/08/11  0845  Yamina Lenis SIMMONS RN, BSN [phone] NCM WILL FOLLOW.

## 2011-09-08 NOTE — H&P (View-Only) (Signed)
Subjective:  Stable.  No recurrent pain.  Symptoms are recently more suggestive.  Discussed with Dr. Mayford Knife last pm, and with patient this am.    Objective:  Vital Signs in the last 24 hours: Temp:  [97.6 F (36.4 C)-98.6 F (37 C)] 98 F (36.7 C) (08/28 0420) Pulse Rate:  [59-73] 63  (08/28 0931) Resp:  [13-21] 18  (08/28 0420) BP: (104-168)/(64-79) 145/78 mmHg (08/28 0931) SpO2:  [95 %-100 %] 95 % (08/28 0420) Weight:  [227 lb 1.6 oz (103.012 kg)-230 lb 14.4 oz (104.736 kg)] 230 lb 14.4 oz (104.736 kg) (08/28 0420)  Intake/Output from previous day: 08/27 0701 - 08/28 0700 In: 240 [P.O.:240] Out: -    Physical Exam: General: Well developed, well nourished, in no acute distress. Head:  Normocephalic and atraumatic. Lungs: Clear to auscultation and percussion. Heart: Normal S1 and S2.  No murmur, rubs or gallops.  Pulses: Pulses normal in all 4 extremities. Extremities: No clubbing or cyanosis. No edema. Neurologic: Alert and oriented x 3.    Lab Results:  Basename 09/08/11 0402 09/07/11 2250  WBC 5.1 4.7  HGB 11.7* 11.8*  PLT 141* 145*    Basename 09/08/11 0402 09/07/11 2250  NA 138 137  K 4.1 3.1*  CL 103 102  CO2 27 25  GLUCOSE 97 168*  BUN 14 13  CREATININE 1.01 0.89    Basename 09/08/11 0406 09/07/11 2250  TROPONINI <0.30 <0.30   Hepatic Function Panel  Basename 09/07/11 2250  PROT 6.8  ALBUMIN 3.7  AST 26  ALT 30  ALKPHOS 61  BILITOT 0.4  BILIDIR --  IBILI --   No results found for this basename: CHOL in the last 72 hours No results found for this basename: PROTIME in the last 72 hours  Imaging: Dg Chest 2 View  09/07/2011  *RADIOLOGY REPORT*  Clinical Data: Chest pain, shortness of breath  CHEST - 2 VIEW  Comparison: CT chest dated 05/24/2011  Findings: Lungs are essentially clear. No pleural effusion or pneumothorax.  Cardiomediastinal silhouette is within normal limits. Postsurgical changes related to prior CABG.  Mild degenerative changes  of the visualized thoracolumbar spine.  IMPRESSION: No evidence of acute cardiopulmonary disease.   Original Report Authenticated By: Charline Bills, M.D.     EKG:  NSR.  IRBBB.   Cardiac Studies:  See albs.   Assessment/Plan:  1.  Chest pain with prior CABG--worrisome for USAP.  Last cath was 2008.  He thinks we should proceed with cath, and I am inclined to do so based on 2008 data.  He is agreeable and we will make plans for restudy. He may or may not get done today as the board is busy.         Shawnie Pons, MD, [facility], FSCAI 09/08/2011, 9:38 AM

## 2011-09-08 NOTE — Interval H&P Note (Signed)
History and Physical Interval Note:  09/08/2011 4:37 PM  [name]  has presented today for surgery, with the diagnosis of cp  The various methods of treatment have been discussed with the patient and family. After consideration of risks, benefits and other options for treatment, the patient has consented to  Procedure(s) (LRB): LEFT HEART CATHETERIZATION WITH CORONARY ANGIOGRAM (N/A) as a surgical intervention .  The patient's history has been reviewed, patient examined, no change in status, stable for surgery.  I have reviewed the patient's chart and labs.  Questions were answered to the patient's satisfaction.     [name]

## 2011-09-08 NOTE — Progress Notes (Signed)
ANTICOAGULATION CONSULT NOTE - Follow Up Consult  Pharmacy Consult for Heparin Indication: chest pain/ACS  Allergies  Allergen Reactions  . Codeine Nausea And Vomiting    Patient Measurements: Height: 6\' 1"  (185.4 cm) Weight: 230 lb 14.4 oz (104.736 kg) IBW/kg (Calculated) : 79.9  Heparin Dosing Weight: 104 kg  Vital Signs: Temp: 97.8 F (36.6 C) (08/28 1341) Temp src: Oral (08/28 1341) BP: 133/71 mmHg (08/28 1341) Pulse Rate: 58  (08/28 1341)  Labs:  Alvira Philips 09/08/11 1402 09/08/11 0909 09/08/11 0406 09/08/11 0402 09/07/11 2250 09/07/11 1753  HGB -- -- -- 11.7* 11.8* --  HCT -- -- -- 34.2* 34.5* 36.6*  PLT -- -- -- 141* 145* 155  APTT -- -- -- -- 31 --  LABPROT -- -- -- -- 13.4 13.1  INR -- -- -- -- 1.00 0.97  HEPARINUNFRC 0.16* -- -- <0.10* -- --  CREATININE -- -- -- 1.01 0.89 0.95  CKTOTAL -- 203 223 -- 259* --  CKMB -- 2.4 2.6 -- 2.9 --  TROPONINI -- <0.30 <0.30 -- <0.30 --    Estimated Creatinine Clearance: 88.9 ml/min (by C-G formula based on Cr of 1.01).  Assessment:   Heparin level is subtherapeutic on 1250 units/hr.   For cardiac cath late this afternoon, if schedule allows.  Goal of Therapy:  Heparin level 0.3-0.7 units/ml Monitor platelets by anticoagulation protocol: Yes   Plan:   Increase heparin to 1550 units/hr.  Will follow-up post-cath; if postponed until 8/29, will recheck heparin level ~ 6hr after increase (9:30pm)  Dennie Fetters, RPh Pager: [phone] 09/08/2011,3:34 PM

## 2011-09-09 ENCOUNTER — Encounter (HOSPITAL_COMMUNITY): Payer: Self-pay | Admitting: Nurse Practitioner

## 2011-09-09 DIAGNOSIS — I200 Unstable angina: Secondary | ICD-10-CM

## 2011-09-09 LAB — CBC
HCT: 35.6 % — ABNORMAL LOW (ref 39.0–52.0)
MCV: 99.2 fL (ref 78.0–100.0)
Platelets: 151 10*3/uL (ref 150–400)
RBC: 3.59 MIL/uL — ABNORMAL LOW (ref 4.22–5.81)
RDW: 12.7 % (ref 11.5–15.5)
WBC: 5.2 10*3/uL (ref 4.0–10.5)

## 2011-09-09 LAB — BASIC METABOLIC PANEL
CO2: 26 mEq/L (ref 19–32)
Chloride: 104 mEq/L (ref 96–112)
Creatinine, Ser: 0.98 mg/dL (ref 0.50–1.35)
GFR calc Af Amer: >90 mL/min (ref >90)
Potassium: 4.1 mEq/L (ref 3.5–5.1)

## 2011-09-09 MED ORDER — NITROGLYCERIN 0.4 MG SL SUBL: 0.4000 mg | SUBLINGUAL_TABLET | SUBLINGUAL | Status: DC | PRN

## 2011-09-09 MED ORDER — CLOPIDOGREL BISULFATE 75 MG PO TABS: 75.0000 mg | ORAL_TABLET | Freq: Every morning | ORAL | Status: DC

## 2011-09-09 NOTE — Progress Notes (Signed)
CARDIAC REHAB PHASE I   PRE:  Rate/Rhythm: 70 SR    BP: sitting 52841    SaO2:   MODE:  Ambulation: 940 ft   POST:  Rate/Rhythm: 77 SR    BP: sitting 32440     SaO2:   Tolerated very well. No c/o. Ed completed. Encouraged more ex and CRPII. Agrees for referral to be sent to [facility].  [id number]  Harriet Masson CES, ACSM

## 2011-09-09 NOTE — Discharge Summary (Signed)
Patient ID: [name],  MRN: [id number], DOB/AGE: [birth year] 66 y.o.  Admit date: 09/07/2011 Discharge date: 09/09/2011  Primary Care Provider: [place] Found CABOT Primary Cardiologist: T. Stuckey, MD  Discharge Diagnoses Principal Problem:  *Unstable angina  **s/p PCI/DES of LM into Ramus this admission. Active Problems:  CAD, ARTERY BYPASS GRAFT  **Patent grafts x 2 this admission.  HYPERLIPIDEMIA  HYPERTENSION, UNSPECIFIED  HYPOTHYROIDISM  DEGENERATIVE JOINT DISEASE   Allergies Allergies  Allergen Reactions  . Codeine Nausea And Vomiting    Procedures  Cardiac Catheterization and Percutaneous Coronary Intervention 09/08/2011  PROCEDURAL FINDINGS Hemodynamics: AO 150/74 LV 153/19  Coronary angiography: Coronary dominance: right  Left mainstem: The left main is moderately calcified. There is diffuse plaque in the proximal and mid shaft of the left main and the vessel then tapers distally into a 70% distal left main stenosis. This is within a previously implanted stent which is difficult to visualize. There is a complex area just beyond the left main trifurcation leading into the ramus intermedius which has severe 80-90% stenosis, clearly progressive compared to the previous study from 2008.   **The LM into the Ramus was successfully stented with a 3.5 x 18 mm Xience Xpedition DES**  Left anterior descending (LAD): The LAD is severely diseased. It occludes just after the first diagonal.  Left circumflex (LCx): The AV groove circumflex is patent. There is an intermediate branch which has been heavily stented. There is a long stented segment that has diffuse 50% stenosis. There is an overlap stent extending beyond the easily visible stent that also has diffuse 30-40% stenosis. Distally the vessel bifurcates into twin branches and has 60% stenosis into both branches. This is unchanged from the previous study. The area of severe stenosis occurs at the proximal portion of  the intermediate branch and this is in an area of previously overlapped drug-eluting and bare-metal stent. The AV groove circumflex has no significant disease  Right coronary artery (RCA): The RCA is severely diseased until it occludes in its midportion.  LIMA to LAD: The vessel is widely patent. The distal anastomotic site is patent. The mid and distal LAD have diffuse mild to moderate plaque without high-grade focal stenosis.  Free RIMA to distal RCA is patent. There is no obstructive disease throughout the graft. The distal anastomotic site is widely patent. The PDA and posterolateral branches are patent.  Left ventriculography: Left ventricular systolic function is normal, LVEF is estimated at 55-65%, there is no significant mitral regurgitation  _____________  History of Present Illness  66 y/o male with the above problem list.  He was in his usoh until approximately 10 days prior to admission when he began to experience hand and elbow pain along with edema.  Approximately 2 days prior to admission, he began to experience mild midsternal chest pressure, which worsened on the day of admission and he was referred from his PCP's office to the [facility] ED.  There, c/p resolved.  ECG was non-acute and CE were negative.  He was admitted for further evaluation.  Hospital Course  Pt r/o for MI.  He had no further chest pain.  Decision was made to pursue diagnostic catheterization, which revealed severe distal LM and Ramus Intermedius disease with 2/2 patent grafts as outlined above.  The Ramus was stented back into the distal LM with a Xience Xpedition DES.  He tolerated this procedure well and post-procedure has had no further chest pain.  He will be discharged home today in good condition.  Discharge  Vitals Blood pressure 148/72, pulse 67, temperature 97.7 F (36.5 C), temperature source Oral, resp. rate 19, height 6\' 1"  (1.854 m), weight 227 lb 15.3 oz (103.4 kg), SpO2 95.00%.  Filed Weights    09/07/11 2207 09/08/11 0420 09/09/11 0400  Weight: 227 lb 1.6 oz (103.012 kg) 230 lb 14.4 oz (104.736 kg) 227 lb 15.3 oz (103.4 kg)   Labs  CBC  Basename 09/09/11 0505 09/08/11 0402 09/07/11 2250  WBC 5.2 5.1 --  NEUTROABS -- -- 1.7  HGB 12.1* 11.7* --  HCT 35.6* 34.2* --  MCV 99.2 98.8 --  PLT 151 141* --   Basic Metabolic Panel  Basename 09/09/11 0505 09/08/11 0402  NA 139 138  K 4.1 4.1  CL 104 103  CO2 26 27  GLUCOSE 106* 97  BUN 13 14  CREATININE 0.98 1.01  CALCIUM 9.6 9.2  MG -- --  PHOS -- --   Liver Function Tests  Basename 09/07/11 2250  AST 26  ALT 30  ALKPHOS 61  BILITOT 0.4  PROT 6.8  ALBUMIN 3.7   Cardiac Enzymes  Basename 09/08/11 0909 09/08/11 0406 09/07/11 2250  CKTOTAL 203 223 259*  CKMB 2.4 2.6 2.9  CKMBINDEX -- -- --  TROPONINI <0.30 <0.30 <0.30   Disposition  Pt is being discharged home today in good condition.  Follow-up Plans & Appointments  Follow-up Information    Follow up with Shawnie Pons, MD on 09/24/2011. (10:00)    Contact information:   [address] [place] [address] [phone]         Discharge Medications  Medication List  As of 09/09/2011 12:12 PM   STOP taking these medications         celecoxib 200 MG capsule         TAKE these medications         amLODipine 5 MG tablet   Commonly known as: NORVASC   Take 5 mg by mouth every morning.      aspirin 81 MG tablet   Take 81 mg by mouth at bedtime.      clopidogrel 75 MG tablet   Commonly known as: PLAVIX   Take 1 tablet (75 mg total) by mouth every morning.      colesevelam 625 MG tablet   Commonly known as: WELCHOL   Take 1,875 mg by mouth daily as needed. For cholesterol; "I only take Welchol when I don't stick to my diet"      CoQ10 100 MG Caps   Take 1 capsule by mouth daily after lunch.      Fish Oil 1200 MG Caps   Take 2 capsules by mouth 2 (two) times daily.      levothyroxine 125  MCG tablet   Commonly known as: SYNTHROID, LEVOTHROID   Take 125 mcg by mouth every morning.      lisinopril 5 MG tablet   Commonly known as: PRINIVIL,ZESTRIL   Take 5 mg by mouth at bedtime.      loratadine 10 MG tablet   Commonly known as: CLARITIN   Take 10 mg by mouth every morning.      metoprolol tartrate 25 MG tablet   Commonly known as: LOPRESSOR   Take 25 mg by mouth 2 (two) times daily.      multivitamin tablet   Take 1 tablet by mouth every other day.      niacin 100 MG tablet   Take 150  mg by mouth at bedtime. 2 tabs po qd      nitroGLYCERIN 0.4 MG SL tablet   Commonly known as: NITROSTAT   Place 1 tablet (0.4 mg total) under the tongue every 5 (five) minutes as needed. For chest pain      pravastatin 10 MG tablet   Commonly known as: PRAVACHOL   Take 5 mg by mouth every evening.      Red Yeast Rice 600 MG Caps   Take 600 mg by mouth daily.      traMADol 50 MG tablet   Commonly known as: ULTRAM   Take 50 mg by mouth every 6 (six) hours as needed. For pain      VITAMIN B-12 IJ   Inject as directed every 30 (thirty) days.          Outstanding Labs/Studies  None  Duration of Discharge Encounter   Greater than 30 minutes including physician time.  Signed, Nicolasa Ducking NP 09/09/2011, 12:12 PM

## 2011-09-09 NOTE — Progress Notes (Signed)
Subjective:  Patient doing well.  No chest pain.  Groin looks good.  No tub baths reinforced.  Appreciate Dr. Earmon Phoenix help.    Objective:  Vital Signs in the last 24 hours: Temp:  [97.4 F (36.3 C)-98.2 F (36.8 C)] 97.7 F (36.5 C) (08/29 0739) Pulse Rate:  [58-71] 67  (08/29 0739) Resp:  [14-23] 19  (08/29 0800) BP: (120-153)/(53-72) 148/72 mmHg (08/29 0800) SpO2:  [95 %-100 %] 95 % (08/29 0739) Weight:  [227 lb 15.3 oz (103.4 kg)] 227 lb 15.3 oz (103.4 kg) (08/29 0400)  Intake/Output from previous day: 08/28 0701 - 08/29 0700 In: 1240 [P.O.:560; I.V.:680] Out: 1601 [Urine:1600; Stool:1]   Physical Exam: General: Well developed, well nourished, in no acute distress. Head:  Normocephalic and atraumatic. Lungs: Clear to auscultation and percussion. Heart: Normal S1 and S2.  No murmur, rubs or gallops.  Pulses: Pulses normal in all 4 extremities.  Groin looks good.   Extremities: No clubbing or cyanosis. No edema. Neurologic: Alert and oriented x 3.    Lab Results:  Basename 09/09/11 0505 09/08/11 0402  WBC 5.2 5.1  HGB 12.1* 11.7*  PLT 151 141*    Basename 09/09/11 0505 09/08/11 0402  NA 139 138  K 4.1 4.1  CL 104 103  CO2 26 27  GLUCOSE 106* 97  BUN 13 14  CREATININE 0.98 1.01    Basename 09/08/11 0909 09/08/11 0406  TROPONINI <0.30 <0.30   Hepatic Function Panel  Basename 09/07/11 2250  PROT 6.8  ALBUMIN 3.7  AST 26  ALT 30  ALKPHOS 61  BILITOT 0.4  BILIDIR --  IBILI --   No results found for this basename: CHOL in the last 72 hours No results found for this basename: PROTIME in the last 72 hours  Imaging: Dg Chest 2 View  09/07/2011  *RADIOLOGY REPORT*  Clinical Data: Chest pain, shortness of breath  CHEST - 2 VIEW  Comparison: CT chest dated 05/24/2011  Findings: Lungs are essentially clear. No pleural effusion or pneumothorax.  Cardiomediastinal silhouette is within normal limits. Postsurgical changes related to prior CABG.  Mild degenerative  changes of the visualized thoracolumbar spine.  IMPRESSION: No evidence of acute cardiopulmonary disease.   Original Report Authenticated By: Charline Bills, M.D.     EKG:  IRBBB.  No acute changes.    Assessment/Plan:  1.  CAD with unstable angina pectoris   ----  SP PCI of the LMCA/CFX ostium.   2.  Hypertension-- controlled 3.  Dyslipidemia  -- stable   Plan  Home today Follow up with me in 2 weeks Probably long duration dual antiplatelet therapy. No tub baths for ten days.      Shawnie Pons, MD, [facility], FSCAI 09/09/2011, 10:43 AM

## 2011-09-24 ENCOUNTER — Encounter: Payer: Self-pay | Admitting: Cardiology

## 2011-09-24 ENCOUNTER — Ambulatory Visit (INDEPENDENT_AMBULATORY_CARE_PROVIDER_SITE_OTHER): Payer: Medicare Other | Admitting: Cardiology

## 2011-09-24 VITALS — BP 137/80 | HR 61 | Ht 73.0 in | Wt 233.0 lb

## 2011-09-24 DIAGNOSIS — E785 Hyperlipidemia, unspecified: Secondary | ICD-10-CM

## 2011-09-24 DIAGNOSIS — I359 Nonrheumatic aortic valve disorder, unspecified: Secondary | ICD-10-CM

## 2011-09-24 DIAGNOSIS — I25810 Atherosclerosis of coronary artery bypass graft(s) without angina pectoris: Secondary | ICD-10-CM

## 2011-09-24 DIAGNOSIS — I2510 Atherosclerotic heart disease of native coronary artery without angina pectoris: Secondary | ICD-10-CM

## 2011-09-24 DIAGNOSIS — I350 Nonrheumatic aortic (valve) stenosis: Secondary | ICD-10-CM | POA: Insufficient documentation

## 2011-09-24 NOTE — Assessment & Plan Note (Signed)
Very mild by echo.  Will need follow up echo in a couple of years.

## 2011-09-24 NOTE — Assessment & Plan Note (Addendum)
Patient remains stable at the present time. He will continue on the current medical regimen. We'll try to hold off on the use of Celebrex for another couple of months if he can. In the arm, we'll continue to follow him. Continue to be followed by Dr. Phillips Odor as well for his primary care issues.  We will see him back in followup in approximately 2 months.

## 2011-09-24 NOTE — Assessment & Plan Note (Signed)
Followed by his primary care provider 

## 2011-09-24 NOTE — Patient Instructions (Addendum)
Your physician recommends that you schedule a follow-up appointment in: 2 MONTHS with Dr Stuckey  Your physician recommends that you continue on your current medications as directed. Please refer to the Current Medication list given to you today.  

## 2011-09-24 NOTE — Progress Notes (Signed)
HPI:  [name] returns in followup. From a cardiac standpoint he is stable. He denies any ongoing chest pain. He underwent percutaneous intervention of the distal left main leading into the circumflex artery. His internal mammary to the LAD was intact, and his vein graft to the right coronary artery remained intact. Overall, the patient is stable and we reviewed his films in the office today.  He is tolerating his medicines, but would like to start back on Celebrex. I've encouraged him to wait about 2-1/2-3 months to do so if he possibly can in order to avoid interference with platelet inhibition.  Current Outpatient Prescriptions  Medication Sig Dispense Refill  . amLODipine (NORVASC) 5 MG tablet Take 5 mg by mouth every morning.      Marland Kitchen aspirin 81 MG tablet Take 81 mg by mouth at bedtime.       . clopidogrel (PLAVIX) 75 MG tablet Take 1 tablet (75 mg total) by mouth every morning.  30 tablet  6  . Coenzyme Q10 (COQ10) 100 MG CAPS Take 1 capsule by mouth daily after lunch.       . colesevelam (WELCHOL) 625 MG tablet Take 1,875 mg by mouth daily as needed. For cholesterol; "I only take Welchol when I don't stick to my diet"      . Cyanocobalamin (VITAMIN B-12 IJ) Inject as directed every 30 (thirty) days.      . Gabapentin (NEURONTIN PO) Take by mouth 2 (two) times daily.      Marland Kitchen levothyroxine (SYNTHROID, LEVOTHROID) 125 MCG tablet Take 125 mcg by mouth every morning.       Marland Kitchen lisinopril (PRINIVIL,ZESTRIL) 5 MG tablet Take 5 mg by mouth at bedtime.      Marland Kitchen loratadine (CLARITIN) 10 MG tablet Take 10 mg by mouth every morning.       . metoprolol tartrate (LOPRESSOR) 25 MG tablet Take 25 mg by mouth 2 (two) times daily.      . Multiple Vitamin (MULTIVITAMIN) tablet Take 1 tablet by mouth every other day.       . niacin 100 MG tablet Take 150 mg by mouth at bedtime. 2 tabs po qd      . nitroGLYCERIN (NITROSTAT) 0.4 MG SL tablet Place 1 tablet (0.4 mg total) under the tongue every 5 (five) minutes as  needed. For chest pain  25 tablet  3  . Omega-3 Fatty Acids (FISH OIL) 1200 MG CAPS Take 2 capsules by mouth 2 (two) times daily.       . pravastatin (PRAVACHOL) 10 MG tablet Take 5 mg by mouth every evening.       . Red Yeast Rice 600 MG CAPS Take 600 mg by mouth daily.        Allergies  Allergen Reactions  . Codeine Nausea And Vomiting    Past Medical History  Diagnosis Date  . Coronary artery disease     a. s/p CABG x 2 (LIMA->LAD, RIMA->RCA);  b. s/p multiple PCI's to Ramus;  c. 08/2011 Cath/PCI: LM 70% into ramus with 70-80% there->treated wtih 3.5x18 Xience Xpedition DES, LCX  nonobs, RCA occluded.  RIMA & LIMA patent, EF 55-65%  . Hypertension   . Hyperlipidemia   . Hypothyroidism   . DJD (degenerative joint disease)   . Chronic back pain     Past Surgical History  Procedure Date  . Coronary artery bypass graft 1997    with (LIMA)  . Knee surgery     bilateral  . Hernia repair   .  Robot assisted laparoscopic radical prostatectomy     Family History  Problem Relation Age of Onset  . Stroke Mother 33    died  . Cancer Father 35    died    History   Social History  . Marital Status: Married    Spouse Name: N/A    Number of Children: N/A  . Years of Education: N/A   Occupational History  . semi-retired    Social History Main Topics  . Smoking status: Former Games developer  . Smokeless tobacco: Not on file   Comment: remote tobacco hx  . Alcohol Use: Yes  . Drug Use: No  . Sexually Active: Not on file   Other Topics Concern  . Not on file   Social History Narrative  . No narrative on file    ROS: Please see the HPI.  All other systems reviewed and negative.  PHYSICAL EXAM:  BP 137/80  Pulse 61  Ht 6\' 1"  (1.854 m)  Wt 233 lb (105.688 kg)  BMI 30.74 kg/m2  General: Well developed, well nourished, in no acute distress. Head:  Normocephalic and atraumatic. Neck: no JVD Lungs: Clear to auscultation and percussion. Heart: Normal S1 and S2.  Pos  S4 Abdomen:  Normal bowel sounds; soft; non tender; no organomegaly Pulses: Pulses normal in all 4 extremities. Extremities: No clubbing or cyanosis. No edema. Neurologic: Alert and oriented x 3.  EKG:  Not done today.  ASSESSMENT AND PLAN:

## 2011-09-27 ENCOUNTER — Other Ambulatory Visit: Payer: Self-pay | Admitting: Cardiology

## 2011-09-30 NOTE — Discharge Summary (Signed)
Patient seen and agree.  Ready for discharge.  Follow up will be with me.  Instructions reviewed.

## 2011-11-24 ENCOUNTER — Encounter: Payer: Self-pay | Admitting: Cardiology

## 2011-11-24 ENCOUNTER — Ambulatory Visit (INDEPENDENT_AMBULATORY_CARE_PROVIDER_SITE_OTHER): Payer: Medicare Other | Admitting: Cardiology

## 2011-11-24 VITALS — BP 156/80 | HR 68 | Ht 73.0 in | Wt 234.0 lb

## 2011-11-24 DIAGNOSIS — I2510 Atherosclerotic heart disease of native coronary artery without angina pectoris: Secondary | ICD-10-CM

## 2011-11-24 DIAGNOSIS — I359 Nonrheumatic aortic valve disorder, unspecified: Secondary | ICD-10-CM

## 2011-11-24 DIAGNOSIS — I350 Nonrheumatic aortic (valve) stenosis: Secondary | ICD-10-CM

## 2011-11-24 DIAGNOSIS — E785 Hyperlipidemia, unspecified: Secondary | ICD-10-CM

## 2011-11-24 DIAGNOSIS — I10 Essential (primary) hypertension: Secondary | ICD-10-CM

## 2011-11-24 NOTE — Progress Notes (Signed)
HPI:  Patient is in for followup visit. Other than arthritic type pains, he is doing pretty well. We talked about the risks of Celebrex in detail today. He's not having current chest pain. Overall he feels good. His right hand particularly does bother him however he would like to go back on Celebrex  Current Outpatient Prescriptions  Medication Sig Dispense Refill  . amLODipine (NORVASC) 5 MG tablet TAKE ONE TABLET BY MOUTH EVERY DAY  90 tablet  3  . aspirin 81 MG tablet Take 81 mg by mouth at bedtime.       . Coenzyme Q10 (COQ10) 100 MG CAPS Take 1 capsule by mouth daily after lunch.       . colesevelam (WELCHOL) 625 MG tablet Take by mouth daily as needed. For cholesterol; "I only take Welchol when I don't stick to my diet"      . Cyanocobalamin (VITAMIN B-12 IJ) Inject as directed every 30 (thirty) days.      . Gabapentin (NEURONTIN PO) Take by mouth 2 (two) times daily.      Marland Kitchen levothyroxine (SYNTHROID, LEVOTHROID) 125 MCG tablet Take 125 mcg by mouth every morning.       Marland Kitchen lisinopril (PRINIVIL,ZESTRIL) 5 MG tablet Take 5 mg by mouth at bedtime.      Marland Kitchen loratadine (CLARITIN) 10 MG tablet Take 10 mg by mouth every morning.       . metoprolol tartrate (LOPRESSOR) 25 MG tablet Take 25 mg by mouth 2 (two) times daily.      . Multiple Vitamin (MULTIVITAMIN) tablet Take 1 tablet by mouth every other day.       . niacin 100 MG tablet Take 150 mg by mouth at bedtime. 2 tabs po qd      . nitroGLYCERIN (NITROSTAT) 0.4 MG SL tablet Place 1 tablet (0.4 mg total) under the tongue every 5 (five) minutes as needed. For chest pain  25 tablet  3  . Omega-3 Fatty Acids (FISH OIL) 1200 MG CAPS Take 2 capsules by mouth 2 (two) times daily.       . pravastatin (PRAVACHOL) 10 MG tablet Take 5 mg by mouth every evening.       . Red Yeast Rice 600 MG CAPS Take 600 mg by mouth daily.      . traMADol (ULTRAM) 50 MG tablet as needed.      . [DISCONTINUED] amLODipine (NORVASC) 5 MG tablet Take 5 mg by mouth every  morning.        Allergies  Allergen Reactions  . Codeine Nausea And Vomiting    Past Medical History  Diagnosis Date  . Coronary artery disease     a. s/p CABG x 2 (LIMA->LAD, RIMA->RCA);  b. s/p multiple PCI's to Ramus;  c. 08/2011 Cath/PCI: LM 70% into ramus with 70-80% there->treated wtih 3.5x18 Xience Xpedition DES, LCX  nonobs, RCA occluded.  RIMA & LIMA patent, EF 55-65%  . Hypertension   . Hyperlipidemia   . Hypothyroidism   . DJD (degenerative joint disease)   . Chronic back pain     Past Surgical History  Procedure Date  . Coronary artery bypass graft 1997    with (LIMA)  . Knee surgery     bilateral  . Hernia repair   . Robot assisted laparoscopic radical prostatectomy     Family History  Problem Relation Age of Onset  . Stroke Mother 42    died  . Cancer Father 66    died  History   Social History  . Marital Status: Married    Spouse Name: N/A    Number of Children: N/A  . Years of Education: N/A   Occupational History  . semi-retired    Social History Main Topics  . Smoking status: Former Games developer  . Smokeless tobacco: Not on file     Comment: remote tobacco hx  . Alcohol Use: Yes  . Drug Use: No  . Sexually Active: Not on file   Other Topics Concern  . Not on file   Social History Narrative  . No narrative on file    ROS: Please see the HPI.  All other systems reviewed and negative.  PHYSICAL EXAM:  BP 156/80  Pulse 68  Ht 6\' 1"  (1.854 m)  Wt 234 lb (106.142 kg)  BMI 30.87 kg/m2  General: Well developed, well nourished, in no acute distress. Head:  Normocephalic and atraumatic. Neck: no JVD Lungs: Clear to auscultation and percussion. Heart: Normal S1 and S2. 1-2/6 SEM.   Abdomen:  Normal bowel sounds; soft; non tender; no organomegaly Pulses: Pulses normal in all 4 extremities. Extremities: No clubbing or cyanosis. No edema. Neurologic: Alert and oriented x 3.  EKG:  None today  ASSESSMENT AND PLAN:

## 2011-11-24 NOTE — Assessment & Plan Note (Signed)
He is stable at the present time. He would like to greatly go back on Celebrex. It is now about 3 months as he underwent percutaneous intervention, and as such we will allow him to resume his Celebrex. I've told him to be cautious, and she noticed a change in his stools he should be aware of the fact that his risk of bleeding is higher. He remains on dual antiplatelet therapy at the present time, I hope is to get him out for about 12 months on the DAPT.

## 2011-11-24 NOTE — Assessment & Plan Note (Signed)
Followed by primary care.   

## 2011-11-24 NOTE — Assessment & Plan Note (Signed)
I encouraged him to get his blood pressure checked after he reinitiate Celebrex. His slightly higher today.

## 2011-11-24 NOTE — Patient Instructions (Addendum)
Your physician recommends that you schedule a follow-up appointment in: 3 MONTHS  RESTART PLAVIX--TAKE 4 TABLETS BY MOUTH TODAY WHEN YOU GET HOME, THEN DECREASE TO ONE TABLET BY MOUTH DAILY  Your physician recommends that you continue on your current medications as directed. Please refer to the Current Medication list given to you today.  You can restart Celebrex but Dr Riley Kill recommends a lower dose of 100mg  daily.  Your physician has requested that you regularly monitor and record your blood pressure readings at home. Please use the same machine at the same time of day to check your readings and record them to bring to your follow-up visit.

## 2011-11-24 NOTE — Assessment & Plan Note (Signed)
Has a minimal systolic ejection murmur

## 2011-12-15 ENCOUNTER — Other Ambulatory Visit: Payer: Self-pay | Admitting: *Deleted

## 2011-12-15 MED ORDER — LISINOPRIL 5 MG PO TABS: 5.0000 mg | ORAL_TABLET | Freq: Every day | ORAL | Status: DC

## 2012-01-24 ENCOUNTER — Telehealth: Payer: Self-pay | Admitting: Cardiology

## 2012-01-24 DIAGNOSIS — E785 Hyperlipidemia, unspecified: Secondary | ICD-10-CM

## 2012-01-24 DIAGNOSIS — I2510 Atherosclerotic heart disease of native coronary artery without angina pectoris: Secondary | ICD-10-CM

## 2012-01-24 DIAGNOSIS — I350 Nonrheumatic aortic (valve) stenosis: Secondary | ICD-10-CM

## 2012-01-24 DIAGNOSIS — I10 Essential (primary) hypertension: Secondary | ICD-10-CM

## 2012-01-24 NOTE — Telephone Encounter (Signed)
Pt has started celebrex due to pain being so severe he has been to a neuro provider and was told he has a carpal tunnel but he did not was not sure that was the case so he did some investigation on his own and believes he has another issue and he wants to discuss this with you

## 2012-01-24 NOTE — Telephone Encounter (Signed)
Left message to call back  

## 2012-01-26 NOTE — Telephone Encounter (Signed)
I spoke with the pt and he increased his Celebrex to 200mg  daily due to worsening pain in joints. The pt is aware of the increased cardiac risk related to this medication but the benefit that he receives from Celebrex is more important to the patient. The pt called to make Dr Riley Kill aware of change in medication and the pt will follow-up with Dr Riley Kill 02/22/12.

## 2012-01-26 NOTE — Telephone Encounter (Signed)
New Problem:    Patient called in returning your call. Please call back. 

## 2012-02-22 ENCOUNTER — Encounter: Payer: Self-pay | Admitting: Cardiology

## 2012-02-22 ENCOUNTER — Ambulatory Visit (INDEPENDENT_AMBULATORY_CARE_PROVIDER_SITE_OTHER): Payer: Medicare Other | Admitting: Cardiology

## 2012-02-22 VITALS — BP 143/79 | HR 61 | Ht 73.0 in | Wt 232.0 lb

## 2012-02-22 DIAGNOSIS — E785 Hyperlipidemia, unspecified: Secondary | ICD-10-CM

## 2012-02-22 DIAGNOSIS — I2510 Atherosclerotic heart disease of native coronary artery without angina pectoris: Secondary | ICD-10-CM

## 2012-02-22 DIAGNOSIS — I359 Nonrheumatic aortic valve disorder, unspecified: Secondary | ICD-10-CM

## 2012-02-22 DIAGNOSIS — I10 Essential (primary) hypertension: Secondary | ICD-10-CM

## 2012-02-22 DIAGNOSIS — I350 Nonrheumatic aortic (valve) stenosis: Secondary | ICD-10-CM

## 2012-02-22 NOTE — Assessment & Plan Note (Signed)
Currently stable at the present time.  No chest pain.  Last DES in 08/2011.  Will continue current meds.

## 2012-02-22 NOTE — Assessment & Plan Note (Signed)
This is very mild.  Clinical follow up with my colleagues.

## 2012-02-22 NOTE — Assessment & Plan Note (Signed)
There is probably no need for niacin.

## 2012-02-22 NOTE — Assessment & Plan Note (Signed)
Controlled at the present time.  

## 2012-02-22 NOTE — Progress Notes (Signed)
HPI:  Today in a followup visit. Overall he seems to be doing extremely well. He has no specific complaints. He is getting ready to go on a trip to [place]. He is not particularly short of breath. He denies any ongoing chest pain.  His arthritis will continue to bother him. I have discussed his meds with him in detail.    Current Outpatient Prescriptions  Medication Sig Dispense Refill  . amLODipine (NORVASC) 5 MG tablet TAKE ONE TABLET BY MOUTH EVERY DAY  90 tablet  3  . aspirin 81 MG tablet Take 81 mg by mouth at bedtime.       . celecoxib (CELEBREX) 200 MG capsule Take 1 capsule (200 mg total) by mouth daily.      . clopidogrel (PLAVIX) 75 MG tablet Take 1 tablet (75 mg total) by mouth daily.  1 tablet  0  . Coenzyme Q10 (COQ10) 100 MG CAPS Take 1 capsule by mouth daily after lunch.       . colesevelam (WELCHOL) 625 MG tablet Take by mouth daily as needed. For cholesterol; "I only take Welchol when I don't stick to my diet"      . Cyanocobalamin (VITAMIN B-12 IJ) Inject as directed every 30 (thirty) days.      Marland Kitchen gabapentin (NEURONTIN) 300 MG capsule Take 300 mg by mouth 3 (three) times daily.      Marland Kitchen levothyroxine (SYNTHROID, LEVOTHROID) 125 MCG tablet Take 125 mcg by mouth every morning.       Marland Kitchen lisinopril (PRINIVIL,ZESTRIL) 5 MG tablet Take 1 tablet (5 mg total) by mouth at bedtime.  90 tablet  3  . loratadine (CLARITIN) 10 MG tablet Take 10 mg by mouth every morning.       . metoprolol tartrate (LOPRESSOR) 25 MG tablet Take 25 mg by mouth 2 (two) times daily.      . Multiple Vitamin (MULTIVITAMIN) tablet Take 1 tablet by mouth every other day.       . niacin 100 MG tablet Take 150 mg by mouth at bedtime. 2 tabs po qd      . nitroGLYCERIN (NITROSTAT) 0.4 MG SL tablet Place 1 tablet (0.4 mg total) under the tongue every 5 (five) minutes as needed. For chest pain  25 tablet  3  . Omega-3 Fatty Acids (FISH OIL) 1200 MG CAPS Take 2 capsules by mouth 2 (two) times daily.       .  pravastatin (PRAVACHOL) 10 MG tablet Take 5 mg by mouth every evening.       . Red Yeast Rice 600 MG CAPS Take 600 mg by mouth daily.      . traMADol (ULTRAM) 50 MG tablet as needed.       No current facility-administered medications for this visit.    Allergies  Allergen Reactions  . Codeine Nausea And Vomiting    Past Medical History  Diagnosis Date  . Coronary artery disease     a. s/p CABG x 2 (LIMA->LAD, RIMA->RCA);  b. s/p multiple PCI's to Ramus;  c. 08/2011 Cath/PCI: LM 70% into ramus with 70-80% there->treated wtih 3.5x18 Xience Xpedition DES, LCX  nonobs, RCA occluded.  RIMA & LIMA patent, EF 55-65%  . Hypertension   . Hyperlipidemia   . Hypothyroidism   . DJD (degenerative joint disease)   . Chronic back pain     Past Surgical History  Procedure Laterality Date  . Coronary artery bypass graft  1997    with (LIMA)  .  Knee surgery      bilateral  . Hernia repair    . Robot assisted laparoscopic radical prostatectomy      Family History  Problem Relation Age of Onset  . Stroke Mother 7    died  . Cancer Father 20    died    History   Social History  . Marital Status: Married    Spouse Name: N/A    Number of Children: N/A  . Years of Education: N/A   Occupational History  . semi-retired    Social History Main Topics  . Smoking status: Former Games developer  . Smokeless tobacco: Not on file     Comment: remote tobacco hx  . Alcohol Use: Yes  . Drug Use: No  . Sexually Active: Not on file   Other Topics Concern  . Not on file   Social History Narrative  . No narrative on file    ROS: Please see the HPI.  All other systems reviewed and negative.  PHYSICAL EXAM:  BP 143/79  Pulse 61  Ht 6\' 1"  (1.854 m)  Wt 232 lb (105.235 kg)  BMI 30.62 kg/m2  SpO2 93%  General: Well developed, well nourished, in no acute distress. Head:  Normocephalic and atraumatic. Neck: no JVD Lungs: Clear to auscultation and percussion. Heart: Normal S1 and S2.  No  murmur, rubs or gallops.  Pulses: Pulses normal in all 4 extremities. Extremities: No clubbing or cyanosis. No edema. Neurologic: Alert and oriented x 3.  EKG:  NSR.  RV conduction delay.  Borderline ECG  ASSESSMENT AND PLAN:

## 2012-02-22 NOTE — Patient Instructions (Addendum)
Your physician wants you to follow-up in: 6 MONTHS with Dr Cooper.  You will receive a reminder letter in the mail two months in advance. If you don't receive a letter, please call our office to schedule the follow-up appointment.  Your physician recommends that you continue on your current medications as directed. Please refer to the Current Medication list given to you today.  

## 2012-02-26 ENCOUNTER — Other Ambulatory Visit: Payer: Self-pay

## 2012-04-27 ENCOUNTER — Other Ambulatory Visit (HOSPITAL_COMMUNITY): Payer: Self-pay | Admitting: Family Medicine

## 2012-04-27 DIAGNOSIS — R911 Solitary pulmonary nodule: Secondary | ICD-10-CM

## 2012-05-01 ENCOUNTER — Ambulatory Visit (HOSPITAL_COMMUNITY)
Admission: RE | Admit: 2012-05-01 | Discharge: 2012-05-01 | Disposition: A | Discharge status: Home / Self Care | Payer: Medicare Other | Source: Ambulatory Visit | Attending: Family Medicine | Admitting: Family Medicine

## 2012-05-01 DIAGNOSIS — Z951 Presence of aortocoronary bypass graft: Secondary | ICD-10-CM | POA: Insufficient documentation

## 2012-05-01 DIAGNOSIS — Z09 Encounter for follow-up examination after completed treatment for conditions other than malignant neoplasm: Secondary | ICD-10-CM | POA: Insufficient documentation

## 2012-05-01 DIAGNOSIS — Z8546 Personal history of malignant neoplasm of prostate: Secondary | ICD-10-CM | POA: Insufficient documentation

## 2012-05-01 DIAGNOSIS — R911 Solitary pulmonary nodule: Secondary | ICD-10-CM | POA: Insufficient documentation

## 2012-05-01 MED ORDER — IOHEXOL 300 MG/ML  SOLN
80.0000 mL | Freq: Once | INTRAMUSCULAR | Status: AC | PRN
  Administered 2012-05-01: 80 mL via INTRAVENOUS

## 2012-07-03 ENCOUNTER — Other Ambulatory Visit: Payer: Self-pay | Admitting: Cardiology

## 2012-08-01 ENCOUNTER — Other Ambulatory Visit: Payer: Self-pay | Admitting: Cardiology

## 2012-08-16 ENCOUNTER — Other Ambulatory Visit: Payer: Self-pay

## 2012-09-05 ENCOUNTER — Encounter (INDEPENDENT_AMBULATORY_CARE_PROVIDER_SITE_OTHER): Payer: Self-pay | Admitting: *Deleted

## 2012-10-04 ENCOUNTER — Telehealth: Payer: Self-pay

## 2012-10-04 NOTE — Telephone Encounter (Signed)
Patient called this afternoon because he received a letter that it was time to be triaged for a tcs. Please call him at [phone] or [phone]

## 2012-10-05 ENCOUNTER — Telehealth: Payer: Self-pay

## 2012-10-05 NOTE — Telephone Encounter (Signed)
Dr. Karilyn Cota did his last colonoscopy in 2004 and pt prefers to stay with him. Phone number given to him.

## 2012-10-05 NOTE — Telephone Encounter (Signed)
Pt called to schedule colonoscopy. He prefers Dr. Karilyn Cota who did his last one in 2004. I gave him that phone number.

## 2012-10-09 ENCOUNTER — Other Ambulatory Visit (INDEPENDENT_AMBULATORY_CARE_PROVIDER_SITE_OTHER): Payer: Self-pay | Admitting: *Deleted

## 2012-10-09 DIAGNOSIS — Z1211 Encounter for screening for malignant neoplasm of colon: Secondary | ICD-10-CM

## 2012-10-11 ENCOUNTER — Telehealth: Payer: Self-pay | Admitting: Cardiology

## 2012-10-11 ENCOUNTER — Telehealth (INDEPENDENT_AMBULATORY_CARE_PROVIDER_SITE_OTHER): Payer: Self-pay | Admitting: *Deleted

## 2012-10-11 DIAGNOSIS — Z1211 Encounter for screening for malignant neoplasm of colon: Secondary | ICD-10-CM

## 2012-10-11 NOTE — Telephone Encounter (Signed)
New problem     Patient has upcoming colonoscopy on  12 /3 . Please advise on plavix 5 day and asa  2 days prior.

## 2012-10-11 NOTE — Telephone Encounter (Signed)
Patient needs movi prep 

## 2012-10-11 NOTE — Telephone Encounter (Signed)
The pt needs to be scheduled for follow-up with Dr Excell Seltzer (previous pt of Dr Riley Kill). Will address need for colonoscopy at that time. I have left a message for the pt to contact our office and schedule an appointment with Dr Excell Seltzer.

## 2012-10-13 MED ORDER — PEG-KCL-NACL-NASULF-NA ASC-C 100 G PO SOLR: 1.0000 | Freq: Once | ORAL | Status: DC

## 2012-10-17 NOTE — Telephone Encounter (Signed)
Left message on machine for pt to contact the office.   

## 2012-10-19 NOTE — Telephone Encounter (Signed)
Left message on machine for pt to contact the office and schedule appointment.

## 2012-10-24 ENCOUNTER — Encounter (INDEPENDENT_AMBULATORY_CARE_PROVIDER_SITE_OTHER): Payer: Self-pay | Admitting: *Deleted

## 2012-10-24 NOTE — Telephone Encounter (Signed)
The pt called and scheduled appointment with Dr Excell Seltzer on 12/13/12. I left a message for Thurston Hole to let her know that it looks like the colonoscopy will need to be rescheduled since the pt scheduled appt with our office on the same day as colonoscopy appt. Will address clearance and medications for colonoscopy once the pt establishes with Dr Excell Seltzer.

## 2012-11-16 ENCOUNTER — Other Ambulatory Visit: Payer: Self-pay

## 2012-11-29 ENCOUNTER — Telehealth (INDEPENDENT_AMBULATORY_CARE_PROVIDER_SITE_OTHER): Payer: Self-pay | Admitting: *Deleted

## 2012-11-29 NOTE — Telephone Encounter (Signed)
agree

## 2012-11-29 NOTE — Telephone Encounter (Signed)
  Procedure: tcs  Reason/Indication:  screening  Has patient had this procedure before?  Yes, 2004 -- EPIC  If so, when, by whom and where?    Is there a family history of colon cancer?  no  Who?  What age when diagnosed?    Is patient diabetic?   no      Does patient have prosthetic heart valve?  no  Do you have a pacemaker?  no  Has patient ever had endocarditis? no  Has patient had joint replacement within last 12 months?  no  Does patient tend to be constipated or take laxatives? no  Is patient on Coumadin, Plavix and/or Aspirin? yes  Medications: asa 81 mg daily, plavix 75 mg daily,  norvasc 5 mg daily, co q 10  100 mg daily, welchol 625 mg prn, vit b12 injection monthly, gabapentin tid, levothyroxine 125 mg daily, lisinopril 5 mg daily, metoprolol daily, omega 3 fish oil 1200 mg daily, pravastatin 10 mg daily  Allergies: nkda  Medication Adjustment: plavix 5 days, asa 2 days  Procedure date & time: 12/28/12 at 830

## 2012-12-11 ENCOUNTER — Other Ambulatory Visit: Payer: Self-pay | Admitting: *Deleted

## 2012-12-11 MED ORDER — LISINOPRIL 5 MG PO TABS: 5.0000 mg | ORAL_TABLET | Freq: Every day | ORAL | Status: DC

## 2012-12-13 ENCOUNTER — Encounter: Payer: Self-pay | Admitting: Cardiovascular Disease

## 2012-12-13 ENCOUNTER — Ambulatory Visit (INDEPENDENT_AMBULATORY_CARE_PROVIDER_SITE_OTHER): Payer: Medicare Other | Admitting: Cardiovascular Disease

## 2012-12-13 VITALS — BP 130/80 | HR 51 | Ht 73.0 in | Wt 226.0 lb

## 2012-12-13 DIAGNOSIS — I359 Nonrheumatic aortic valve disorder, unspecified: Secondary | ICD-10-CM

## 2012-12-13 DIAGNOSIS — I2510 Atherosclerotic heart disease of native coronary artery without angina pectoris: Secondary | ICD-10-CM

## 2012-12-13 DIAGNOSIS — I350 Nonrheumatic aortic (valve) stenosis: Secondary | ICD-10-CM

## 2012-12-13 DIAGNOSIS — I10 Essential (primary) hypertension: Secondary | ICD-10-CM

## 2012-12-13 NOTE — Patient Instructions (Signed)
Your physician recommends that you continue on your current medications as directed. Please refer to the Current Medication list given to you today.  Your physician wants you to follow-up in: 1 YEAR with Dr Excell Seltzer.  You will receive a reminder letter in the mail two months in advance. If you don't receive a letter, please call our office to schedule the follow-up appointment.  You can hold plavix 5 days prior to colonoscopy, please do not stop Aspirin.

## 2012-12-13 NOTE — Progress Notes (Signed)
HPI:  67 year old gentleman presenting for followup evaluation. The patient has a history of coronary artery disease and two-vessel CABG with a LIMA to LAD and RIMA to RCA. He has also undergone PCI, most recently in 2013 with a drug-eluting stent in the ramus intermedius. His bypass grafts were patent at that time. His left ventricular ejection fraction has been preserved. His CABG dates back to 4.  The patient is doing well. He stays physically active. He has no exertional symptoms. He's been statin intolerant and complains of bad cramps that have occurred on multiple agents.  From a cardiac perspective, he denies chest pain, shortness of breath, edema, or palpitations. He reports no recent medication changes. He's had no bleeding problems on a combination of aspirin and Plavix.  Outpatient Encounter Prescriptions as of 12/13/2012  Medication Sig  . amLODipine (NORVASC) 5 MG tablet TAKE ONE TABLET BY MOUTH EVERY DAY  . aspirin 81 MG tablet Take 81 mg by mouth at bedtime.   . celecoxib (CELEBREX) 200 MG capsule Take 1 capsule (200 mg total) by mouth daily.  . clopidogrel (PLAVIX) 75 MG tablet Take 1 tablet (75 mg total) by mouth daily.  . Coenzyme Q10 (COQ10) 100 MG CAPS Take 1 capsule by mouth daily after lunch.   . Cyanocobalamin (VITAMIN B-12 IJ) Inject as directed every 30 (thirty) days.  Marland Kitchen gabapentin (NEURONTIN) 300 MG capsule Take 300 mg by mouth 3 (three) times daily.  Marland Kitchen levothyroxine (SYNTHROID, LEVOTHROID) 125 MCG tablet Take 125 mcg by mouth every morning.   Marland Kitchen lisinopril (PRINIVIL,ZESTRIL) 5 MG tablet Take 1 tablet (5 mg total) by mouth at bedtime.  . metoprolol tartrate (LOPRESSOR) 25 MG tablet Take 25 mg by mouth daily.   . Multiple Vitamin (MULTIVITAMIN) tablet Take 1 tablet by mouth every other day.   . Omega-3 Fatty Acids (FISH OIL) 1200 MG CAPS Take 2 capsules by mouth 2 (two) times daily.   . pravastatin (PRAVACHOL) 10 MG tablet Take 5 mg by mouth every evening.   .  [DISCONTINUED] loratadine (CLARITIN) 10 MG tablet Take 10 mg by mouth every morning.   . colesevelam (WELCHOL) 625 MG tablet Take by mouth daily as needed. For cholesterol; "I only take Welchol when I don't stick to my diet"  . nitroGLYCERIN (NITROSTAT) 0.4 MG SL tablet Place 1 tablet (0.4 mg total) under the tongue every 5 (five) minutes as needed. For chest pain  . [DISCONTINUED] amLODipine (NORVASC) 5 MG tablet TAKE ONE TABLET BY MOUTH EVERY DAY  . [DISCONTINUED] metoprolol tartrate (LOPRESSOR) 25 MG tablet TAKE ONE TABLET BY MOUTH TWICE DAILY  . [DISCONTINUED] niacin 100 MG tablet Take 150 mg by mouth at bedtime. 2 tabs po qd  . [DISCONTINUED] peg 3350 powder (MOVIPREP) 100 G SOLR Take 1 kit (200 g total) by mouth once.  . [DISCONTINUED] Red Yeast Rice 600 MG CAPS Take 600 mg by mouth daily.  . [DISCONTINUED] traMADol (ULTRAM) 50 MG tablet as needed.    Allergies  Allergen Reactions  . Codeine Nausea And Vomiting    Past Medical History  Diagnosis Date  . Coronary artery disease     a. s/p CABG x 2 (LIMA->LAD, RIMA->RCA);  b. s/p multiple PCI's to Ramus;  c. 08/2011 Cath/PCI: LM 70% into ramus with 70-80% there->treated wtih 3.5x18 Xience Xpedition DES, LCX  nonobs, RCA occluded.  RIMA & LIMA patent, EF 55-65%  . Hypertension   . Hyperlipidemia   . Hypothyroidism   . DJD (degenerative joint disease)   .  Chronic back pain     ROS: Negative except as per HPI  BP 130/80  Pulse 51  Ht 6\' 1"  (1.854 m)  Wt 226 lb (102.513 kg)  BMI 29.82 kg/m2  PHYSICAL EXAM: Pt is alert and oriented, NAD HEENT: normal Neck: JVP - normal, carotids 2+= without bruits Lungs: CTA bilaterally CV: RRR without murmur or gallop Abd: soft, NT, Positive BS, no hepatomegaly Ext: no C/C/E, distal pulses intact and equal Skin: warm/dry no rash  EKG:  Sinus bradycardia 51 beats per minute, incomplete right bundle branch block  ASSESSMENT AND PLAN: 1. CAD, native vessel. Fortunately he has graft patency  greater than 15 years out from CABG. He underwent PCI over 12 months ago. He has upcoming screening colonoscopy and it is reasonable to hold Plavix 5 days prior to that study. I would like him to continue on aspirin 81 mg daily. He should start back on Plavix once it is safe from a bleeding perspective.  2. Hyperlipidemia. He is statin intolerant and is off of these medications. His lipids are followed by Dr. Phillips Odor. The patient watches his diet carefully.  3. Hypertension. Blood pressure is well controlled on amlodipine, lisinopril, and metoprolol.  For followup I will see him back in one year.  [name] 12/13/2012 11:23 AM

## 2012-12-18 ENCOUNTER — Telehealth (INDEPENDENT_AMBULATORY_CARE_PROVIDER_SITE_OTHER): Payer: Self-pay | Admitting: *Deleted

## 2012-12-18 DIAGNOSIS — Z1211 Encounter for screening for malignant neoplasm of colon: Secondary | ICD-10-CM

## 2012-12-18 NOTE — Telephone Encounter (Signed)
Patient needs movi prep 

## 2012-12-21 MED ORDER — PEG-KCL-NACL-NASULF-NA ASC-C 100 G PO SOLR: 1.0000 | Freq: Once | ORAL | Status: DC

## 2012-12-22 ENCOUNTER — Encounter (HOSPITAL_COMMUNITY): Payer: Self-pay | Admitting: Pharmacy Technician

## 2012-12-28 ENCOUNTER — Encounter (HOSPITAL_COMMUNITY): Payer: Self-pay | Admitting: *Deleted

## 2012-12-28 ENCOUNTER — Ambulatory Visit (HOSPITAL_COMMUNITY)
Admission: RE | Admit: 2012-12-28 | Discharge: 2012-12-28 | Disposition: A | Discharge status: Home / Self Care | Payer: Medicare Other | Source: Ambulatory Visit | Attending: Internal Medicine | Admitting: Internal Medicine

## 2012-12-28 ENCOUNTER — Encounter (HOSPITAL_COMMUNITY)
Admission: RE | Disposition: A | Discharge status: Home / Self Care | Payer: Self-pay | Source: Ambulatory Visit | Attending: Internal Medicine

## 2012-12-28 DIAGNOSIS — Z7982 Long term (current) use of aspirin: Secondary | ICD-10-CM | POA: Insufficient documentation

## 2012-12-28 DIAGNOSIS — I10 Essential (primary) hypertension: Secondary | ICD-10-CM | POA: Insufficient documentation

## 2012-12-28 DIAGNOSIS — K644 Residual hemorrhoidal skin tags: Secondary | ICD-10-CM | POA: Insufficient documentation

## 2012-12-28 DIAGNOSIS — K5730 Diverticulosis of large intestine without perforation or abscess without bleeding: Secondary | ICD-10-CM

## 2012-12-28 DIAGNOSIS — D126 Benign neoplasm of colon, unspecified: Secondary | ICD-10-CM

## 2012-12-28 DIAGNOSIS — Z1211 Encounter for screening for malignant neoplasm of colon: Secondary | ICD-10-CM

## 2012-12-28 DIAGNOSIS — Z951 Presence of aortocoronary bypass graft: Secondary | ICD-10-CM | POA: Insufficient documentation

## 2012-12-28 HISTORY — PX: COLONOSCOPY: SHX5424

## 2012-12-28 SURGERY — COLONOSCOPY
Anesthesia: Moderate Sedation

## 2012-12-28 MED ORDER — SODIUM CHLORIDE 0.9 % IV SOLN
INTRAVENOUS | Status: DC
  Administered 2012-12-28: 1000 mL via INTRAVENOUS

## 2012-12-28 MED ORDER — MEPERIDINE HCL 50 MG/ML IJ SOLN
INTRAMUSCULAR | Status: AC
  Filled 2012-12-28: qty 1

## 2012-12-28 MED ORDER — MEPERIDINE HCL 50 MG/ML IJ SOLN
INTRAMUSCULAR | Status: DC | PRN
  Administered 2012-12-28 (×2): 25 mg via INTRAVENOUS

## 2012-12-28 MED ORDER — STERILE WATER FOR IRRIGATION IR SOLN
Status: DC | PRN
  Administered 2012-12-28: 09:00:00

## 2012-12-28 MED ORDER — MIDAZOLAM HCL 5 MG/5ML IJ SOLN
INTRAMUSCULAR | Status: DC | PRN
  Administered 2012-12-28 (×3): 2 mg via INTRAVENOUS

## 2012-12-28 MED ORDER — MIDAZOLAM HCL 5 MG/5ML IJ SOLN
INTRAMUSCULAR | Status: AC
  Filled 2012-12-28: qty 10

## 2012-12-28 NOTE — H&P (Signed)
[name] is an 67 y.o. male.   Chief Complaint: Patient is  here for colonoscopy. HPI: Patient is a 67 year old Caucasian male who is here for screening colonoscopy. His last exam was 10 years ago. He denies abdominal pain change in his bowel habits and family history remains negative for CRC.  Past Medical History  Diagnosis Date  . Coronary artery disease     a. s/p CABG x 2 (LIMA->LAD, RIMA->RCA);  b. s/p multiple PCI's to Ramus;  c. 08/2011 Cath/PCI: LM 70% into ramus with 70-80% there->treated wtih 3.5x18 Xience Xpedition DES, LCX  nonobs, RCA occluded.  RIMA & LIMA patent, EF 55-65%  . Hypertension   . Hyperlipidemia   . Hypothyroidism   . DJD (degenerative joint disease)   . Chronic back pain     Past Surgical History  Procedure Laterality Date  . Coronary artery bypass graft  1997    with (LIMA)  . Knee surgery      bilateral  . Hernia repair    . Robot assisted laparoscopic radical prostatectomy      Family History  Problem Relation Age of Onset  . Stroke Mother 37    died  . Cancer Father 55    died   Social History:  reports that he has quit smoking. He does not have any smokeless tobacco history on file. He reports that he drinks alcohol. He reports that he does not use illicit drugs.  Allergies:  Allergies  Allergen Reactions  . Codeine Nausea And Vomiting    Medications Prior to Admission  Medication Sig Dispense Refill  . amLODipine (NORVASC) 5 MG tablet TAKE ONE TABLET BY MOUTH EVERY DAY  90 tablet  3  . aspirin 81 MG tablet Take 81 mg by mouth at bedtime.       . celecoxib (CELEBREX) 200 MG capsule Take 1 capsule (200 mg total) by mouth daily.      . clopidogrel (PLAVIX) 75 MG tablet Take 1 tablet (75 mg total) by mouth daily.  1 tablet  0  . Coenzyme Q10 (COQ10) 200 MG CAPS Take 1 capsule by mouth daily.      . colesevelam (WELCHOL) 625 MG tablet Take by mouth daily as needed. For cholesterol; "I only take Welchol when I don't stick to my  diet"      . Cyanocobalamin (VITAMIN B-12 IJ) Inject as directed every 30 (thirty) days.      Marland Kitchen gabapentin (NEURONTIN) 300 MG capsule Take 300 mg by mouth 3 (three) times daily.      Marland Kitchen levothyroxine (SYNTHROID, LEVOTHROID) 150 MCG tablet Take 150 mcg by mouth daily before breakfast.      . lisinopril (PRINIVIL,ZESTRIL) 5 MG tablet Take 1 tablet (5 mg total) by mouth at bedtime.  90 tablet  0  . metoprolol tartrate (LOPRESSOR) 25 MG tablet Take 25 mg by mouth daily.       . Multiple Vitamin (MULTIVITAMIN) tablet Take 1 tablet by mouth every other day.       . nitroGLYCERIN (NITROSTAT) 0.4 MG SL tablet Place 1 tablet (0.4 mg total) under the tongue every 5 (five) minutes as needed. For chest pain  25 tablet  3  . Omega-3 Fatty Acids (FISH OIL) 1200 MG CAPS Take 2 capsules by mouth 2 (two) times daily.         No results found for this or any previous visit (from the past 48 hour(s)). No results found.  ROS  Blood pressure 131/64,  pulse 51, temperature 97.5 F (36.4 C), temperature source Oral, resp. rate 20, height 6\' 1"  (1.854 m), weight 226 lb (102.513 kg), SpO2 98.00%. Physical Exam  Constitutional: He appears well-developed and well-nourished.  HENT:  Mouth/Throat: Oropharynx is clear and moist.  Eyes: Conjunctivae are normal. No scleral icterus.  Neck: No thyromegaly present.  Cardiovascular: Normal rate, regular rhythm and normal heart sounds.   No murmur heard. Respiratory: Effort normal.  GI: Soft. He exhibits no distension and no mass. There is no tenderness.  Musculoskeletal: He exhibits no edema.  Lymphadenopathy:    He has no cervical adenopathy.  Neurological: He is alert.  Skin: Skin is warm and dry.     Assessment/Plan Average risk screening colonoscopy.  Kennya Schwenn U 12/28/2012, 8:30 AM

## 2012-12-28 NOTE — Op Note (Addendum)
COLONOSCOPY PROCEDURE REPORT  PATIENT:  [name]  MR#:  [id number] Birthdate:  [birth year], 67 y.o., male Endoscopist:  Dr. Malissa Hippo, MD Referred By:  Dr. Colette Ribas, MD Procedure Date: 12/28/2012  Procedure:   Colonoscopy  Indications:  Patient is 67 year old Caucasian male who is undergoing average risk screening colonoscopy.  Informed Consent:  The procedure and risks were reviewed with the patient and informed consent was obtained.  Medications:  Demerol 50 mg IV Versed 6 mg IV  Description of procedure:  After a digital rectal exam was performed, that colonoscope was advanced from the anus through the rectum and colon to the area of the cecum, ileocecal valve and appendiceal orifice. The cecum was deeply intubated. These structures were well-seen and photographed for the record. From the level of the cecum and ileocecal valve, the scope was slowly and cautiously withdrawn. The mucosal surfaces were carefully surveyed utilizing scope tip to flexion to facilitate fold flattening as needed. The scope was pulled down into the rectum where a thorough exam including retroflexion was performed.  Findings:  Prep satisfactory. Small polyp ablated via cold biopsy from proximal transverse colon. Multiple diverticula noted at sigmoid colon. Normal rectal mucosa. Mall hemorrhoids below the dentate line.   Therapeutic/Diagnostic Maneuvers Performed:  See above  Complications:  None  Cecal Withdrawal Time:  8 minutes  Impression:  Examination performed to cecum. Small polyp ablated via cold biopsy from proximal transverse colon. Multiple diverticula at sigmoid colon. Small external hemorrhoids.   Recommendations:  Standard instructions given. I will contact patient with biopsy results and further recommendations.  Shanon Becvar U  12/28/2012 9:07 AM  CC: Dr. Phillips Odor, [name], MD & Dr. Bonnetta Barry ref. provider found

## 2013-01-01 ENCOUNTER — Encounter (HOSPITAL_COMMUNITY): Payer: Self-pay | Admitting: Internal Medicine

## 2013-01-15 ENCOUNTER — Other Ambulatory Visit: Payer: Self-pay

## 2013-01-15 ENCOUNTER — Other Ambulatory Visit: Payer: Self-pay | Admitting: Cardiovascular Disease

## 2013-01-15 MED ORDER — AMLODIPINE BESYLATE 5 MG PO TABS: ORAL_TABLET | ORAL | Status: DC

## 2013-01-22 ENCOUNTER — Encounter (INDEPENDENT_AMBULATORY_CARE_PROVIDER_SITE_OTHER): Payer: Self-pay | Admitting: *Deleted

## 2013-03-10 ENCOUNTER — Other Ambulatory Visit: Payer: Self-pay | Admitting: Cardiovascular Disease

## 2013-07-02 ENCOUNTER — Other Ambulatory Visit (HOSPITAL_COMMUNITY): Payer: Self-pay | Admitting: Physician Assistant

## 2013-07-02 DIAGNOSIS — R911 Solitary pulmonary nodule: Secondary | ICD-10-CM

## 2013-07-05 ENCOUNTER — Ambulatory Visit (HOSPITAL_COMMUNITY)
Admission: RE | Admit: 2013-07-05 | Discharge: 2013-07-05 | Disposition: A | Discharge status: Home / Self Care | Payer: Medicare Other | Source: Ambulatory Visit | Attending: Physician Assistant | Admitting: Physician Assistant

## 2013-07-05 DIAGNOSIS — R911 Solitary pulmonary nodule: Secondary | ICD-10-CM

## 2013-07-05 DIAGNOSIS — Z951 Presence of aortocoronary bypass graft: Secondary | ICD-10-CM | POA: Insufficient documentation

## 2013-07-05 MED ORDER — IOHEXOL 300 MG/ML  SOLN
80.0000 mL | Freq: Once | INTRAMUSCULAR | Status: AC | PRN
  Administered 2013-07-05: 80 mL via INTRAVENOUS

## 2013-10-30 ENCOUNTER — Ambulatory Visit (HOSPITAL_COMMUNITY)
Admission: RE | Admit: 2013-10-30 | Discharge: 2013-10-30 | Disposition: A | Discharge status: Home / Self Care | Payer: Medicare Other | Source: Ambulatory Visit | Attending: Family Medicine | Admitting: Family Medicine

## 2013-10-30 ENCOUNTER — Other Ambulatory Visit (HOSPITAL_COMMUNITY): Payer: Self-pay | Admitting: Family Medicine

## 2013-10-30 DIAGNOSIS — R0781 Pleurodynia: Secondary | ICD-10-CM

## 2013-10-30 DIAGNOSIS — S20211A Contusion of right front wall of thorax, initial encounter: Secondary | ICD-10-CM

## 2013-10-30 DIAGNOSIS — W1789XA Other fall from one level to another, initial encounter: Secondary | ICD-10-CM | POA: Diagnosis not present

## 2013-12-20 ENCOUNTER — Encounter (HOSPITAL_COMMUNITY): Payer: Self-pay | Admitting: Cardiovascular Disease

## 2014-01-24 ENCOUNTER — Encounter: Payer: Self-pay | Admitting: Cardiovascular Disease

## 2014-01-24 ENCOUNTER — Ambulatory Visit (INDEPENDENT_AMBULATORY_CARE_PROVIDER_SITE_OTHER): Payer: Medicare Other | Admitting: Cardiovascular Disease

## 2014-01-24 VITALS — BP 134/76 | HR 73 | Ht 73.0 in | Wt 213.1 lb

## 2014-01-24 DIAGNOSIS — R011 Cardiac murmur, unspecified: Secondary | ICD-10-CM

## 2014-01-24 DIAGNOSIS — I200 Unstable angina: Secondary | ICD-10-CM | POA: Diagnosis not present

## 2014-01-24 DIAGNOSIS — I10 Essential (primary) hypertension: Secondary | ICD-10-CM | POA: Diagnosis not present

## 2014-01-24 MED ORDER — NITROGLYCERIN 0.4 MG SL SUBL
0.4000 mg | SUBLINGUAL_TABLET | SUBLINGUAL | Status: DC | PRN
Start: 2014-01-24 — End: 2016-08-31

## 2014-01-24 NOTE — Patient Instructions (Signed)
Your physician wants you to follow-up in: 1 YEAR with Dr Cooper.  You will receive a reminder letter in the mail two months in advance. If you don't receive a letter, please call our office to schedule the follow-up appointment.  Your physician recommends that you continue on your current medications as directed. Please refer to the Current Medication list given to you today.  

## 2014-01-24 NOTE — Progress Notes (Signed)
CARDIOLOGY OFFICE NOTE Date:  01/24/2014   ID:  [name], DOB [birth year], MRN [id number]  PCP:  Purvis Kilts, MD   Chief Complaint  Patient presents with  . Hypertension    SWELLING AND SOB  . Coronary Artery Disease     HISTORY OF PRESENT ILLNESS: [name] is a 69 y.o. male who presents today for cardiology evaluation.   The patient has a history of coronary artery disease and two-vessel CABG with a LIMA to LAD and RIMA to RCA. He has also undergone PCI, most recently in 2013 with a drug-eluting stent in the ramus intermedius. His bypass grafts were patent at that time. His left ventricular ejection fraction has been preserved. His CABG dates back to 59.  States that he feels 'good.' He did have a fall 3-4 months ago and in retrospect he feels like he had syncope. He had been feeling lightheaded, but that has now improved. He reports good energy. No chest pain. He does complain of gait instability. He admits to mild DOE but feels this is normal and only occurs with exertion at a good level. He can get his HR up to 130 bpm without symptoms.  He had some swelling a few months ago, but since he has lost about 10 pounds through diet and increased exercise, the swelling has resolved. No bleeding problems or other complaints.   Past Medical History  Diagnosis Date  . Coronary artery disease     a. s/p CABG x 2 (LIMA->LAD, RIMA->RCA);  b. s/p multiple PCI's to Ramus;  c. 08/2011 Cath/PCI: LM 70% into ramus with 70-80% there->treated wtih 3.5x18 Xience Xpedition DES, LCX  nonobs, RCA occluded.  RIMA & LIMA patent, EF 55-65%  . Hypertension   . Hyperlipidemia   . Hypothyroidism   . DJD (degenerative joint disease)   . Chronic back pain     Current Outpatient Prescriptions  Medication Sig Dispense Refill  . amLODipine (NORVASC) 5 MG tablet TAKE ONE TABLET BY MOUTH EVERY DAY 90 tablet 3  . aspirin 81 MG tablet Take 81 mg by mouth at bedtime.     . celecoxib  (CELEBREX) 200 MG capsule Take 1 capsule (200 mg total) by mouth daily.    . clopidogrel (PLAVIX) 75 MG tablet Take 1 tablet (75 mg total) by mouth daily. 1 tablet 0  . Coenzyme Q10 (COQ10) 200 MG CAPS Take 1 capsule by mouth daily.    . colesevelam (WELCHOL) 625 MG tablet Take by mouth daily as needed. For cholesterol; "I only take Welchol when I don't stick to my diet"    . Cyanocobalamin (VITAMIN B-12 IJ) Inject as directed every 30 (thirty) days.    Marland Kitchen gabapentin (NEURONTIN) 300 MG capsule Take 300 mg by mouth 3 (three) times daily.    Marland Kitchen levothyroxine (SYNTHROID, LEVOTHROID) 150 MCG tablet Take 150 mcg by mouth daily before breakfast.    . lisinopril (PRINIVIL,ZESTRIL) 5 MG tablet TAKE 1 TABLET BY MOUTH EVERY NIGHT AT BEDTIME 90 tablet 2  . metoprolol tartrate (LOPRESSOR) 25 MG tablet Take 25 mg by mouth daily.     . Multiple Vitamin (MULTIVITAMIN) tablet Take 1 tablet by mouth every other day.     . nitroGLYCERIN (NITROSTAT) 0.4 MG SL tablet Place 1 tablet (0.4 mg total) under the tongue every 5 (five) minutes as needed. For chest pain 25 tablet 3  . Omega-3 Fatty Acids (FISH OIL) 1200 MG CAPS Take 2 capsules by mouth 2 (two) times  daily.     . traMADol (ULTRAM) 50 MG tablet Take 50 mg by mouth every 8 (eight) hours as needed.  1   No current facility-administered medications for this visit.    ALLERGIES:   Codeine   SOCIAL HISTORY:  The patient  reports that he has quit smoking. He does not have any smokeless tobacco history on file. He reports that he drinks alcohol. He reports that he does not use illicit drugs.   FAMILY HISTORY:  The patient's family history includes Cancer (age of onset: 20) in his father; Stroke (age of onset: 22) in his mother.    REVIEW OF SYSTEMS:  Positive for leg swelling, hearing loss, visual disturbance, mild DOE, back pain, dizziness, easy bruising, snoring, and nausea. Also positive for balance problems.   All other systems are reviewed and negative.    PHYSICAL EXAM: VS:  BP 134/76 mmHg  Pulse 73  Ht 6\' 1"  (1.854 m)  Wt 213 lb 1.9 oz (96.671 kg)  BMI 28.12 kg/m2 , BMI Body mass index is 28.12 kg/(m^2). GEN: Well nourished, well developed, in no acute distress HEENT: normal Neck: No JVD. carotids 2+ without bruits or masses Cardiac: The heart is RRR with  Grade 2/6 early peaking systolic ejection murmur at the left sternal border. No edema. Pedal pulses 2+ = bilaterally  Respiratory:  clear to auscultation bilaterally GI: soft, nontender, nondistended, + BS MS: no deformity or atrophy Skin: warm and dry, no rash Neuro:  Strength and sensation are intact Psych: euthymic mood, full affect  EKG:  EKG is ordered today. The ekg ordered today shows  Normal sinus rhythm 73 bpm, incomplete right bundle branch block, age-indeterminate inferior infarct.  RECENT LABS: No results found for requested labs within last 365 days.  No results found for requested labs within last 365 days.   CrCl cannot be calculated (Patient has no serum creatinine result on file.).   Wt Readings from Last 3 Encounters:  01/24/14 213 lb 1.9 oz (96.671 kg)  12/28/12 226 lb (102.513 kg)  12/13/12 226 lb (102.513 kg)     CARDIAC STUDIES:   Cardiac catheterization 09/08/2011: Final Conclusions:   1. Severe native three-vessel coronary artery disease 2. Status post coronary bypass surgery with continued patency of the LIMA to LAD and free RIMA to RCA 3. Preserved LV systolic function 4. Successful PCI of the distal left main into the ramus intermedius  Recommendations: Continued dual antiplatelet therapy with aspirin and Plavix indefinitely  ASSESSMENT AND PLAN: 1.   Coronary artery disease, native vessel. Most recent cardiac catheterization from 2013 reviewed. At that time he was noted to have patent bypass grafts and he underwent PCI of the distal left main into the native ramus intermedius branch. He has been maintained on dual antiplatelet therapy  with aspirin and Plavix be tolerating this well. Will continue the same medical program without changes.  2. Hyperlipidemia: The patient is statin intolerant. Lipids are followed by his primary physician. He continues to work on lifestyle modification through diet and exercise. States side effects of statins were 'unbearable.'  3. Essential hypertension: blood pressure is controlled on his current medical program which includes amlodipine, lisinopril, and metoprolol.  Labs/ tests ordered today include:  none  Disposition:   FU with  Me in 12 months  Signed, Sherren Mocha 01/24/2014 2:40 PM     [address] [place] [address]  [phone] (office) [phone] (fax)

## 2014-02-18 ENCOUNTER — Other Ambulatory Visit: Payer: Self-pay

## 2014-02-18 MED ORDER — AMLODIPINE BESYLATE 5 MG PO TABS: ORAL_TABLET | ORAL | Status: DC

## 2014-06-24 DIAGNOSIS — E663 Overweight: Secondary | ICD-10-CM | POA: Diagnosis not present

## 2014-06-24 DIAGNOSIS — Z6829 Body mass index (BMI) 29.0-29.9, adult: Secondary | ICD-10-CM | POA: Diagnosis not present

## 2014-06-24 DIAGNOSIS — C61 Malignant neoplasm of prostate: Secondary | ICD-10-CM | POA: Diagnosis not present

## 2014-06-24 DIAGNOSIS — Z0000 Encounter for general adult medical examination without abnormal findings: Secondary | ICD-10-CM | POA: Diagnosis not present

## 2014-06-24 DIAGNOSIS — E782 Mixed hyperlipidemia: Secondary | ICD-10-CM | POA: Diagnosis not present

## 2014-06-26 DIAGNOSIS — Z1389 Encounter for screening for other disorder: Secondary | ICD-10-CM | POA: Diagnosis not present

## 2014-06-26 DIAGNOSIS — C61 Malignant neoplasm of prostate: Secondary | ICD-10-CM | POA: Diagnosis not present

## 2014-06-26 DIAGNOSIS — E782 Mixed hyperlipidemia: Secondary | ICD-10-CM | POA: Diagnosis not present

## 2014-06-26 DIAGNOSIS — Z0000 Encounter for general adult medical examination without abnormal findings: Secondary | ICD-10-CM | POA: Diagnosis not present

## 2014-08-14 DIAGNOSIS — D696 Thrombocytopenia, unspecified: Secondary | ICD-10-CM | POA: Diagnosis not present

## 2014-08-23 DIAGNOSIS — D582 Other hemoglobinopathies: Secondary | ICD-10-CM | POA: Diagnosis not present

## 2014-08-26 DIAGNOSIS — H5203 Hypermetropia, bilateral: Secondary | ICD-10-CM | POA: Diagnosis not present

## 2014-08-26 DIAGNOSIS — H52223 Regular astigmatism, bilateral: Secondary | ICD-10-CM | POA: Diagnosis not present

## 2014-08-26 DIAGNOSIS — H524 Presbyopia: Secondary | ICD-10-CM | POA: Diagnosis not present

## 2014-08-26 DIAGNOSIS — H25811 Combined forms of age-related cataract, right eye: Secondary | ICD-10-CM | POA: Diagnosis not present

## 2014-08-27 DIAGNOSIS — Z1211 Encounter for screening for malignant neoplasm of colon: Secondary | ICD-10-CM | POA: Diagnosis not present

## 2014-11-08 DIAGNOSIS — Z23 Encounter for immunization: Secondary | ICD-10-CM | POA: Diagnosis not present

## 2014-12-16 DIAGNOSIS — Z09 Encounter for follow-up examination after completed treatment for conditions other than malignant neoplasm: Secondary | ICD-10-CM | POA: Diagnosis not present

## 2014-12-16 DIAGNOSIS — C672 Malignant neoplasm of lateral wall of bladder: Secondary | ICD-10-CM | POA: Diagnosis not present

## 2014-12-16 DIAGNOSIS — N359 Urethral stricture, unspecified: Secondary | ICD-10-CM | POA: Diagnosis not present

## 2014-12-16 DIAGNOSIS — C61 Malignant neoplasm of prostate: Secondary | ICD-10-CM | POA: Diagnosis not present

## 2015-03-24 ENCOUNTER — Ambulatory Visit (INDEPENDENT_AMBULATORY_CARE_PROVIDER_SITE_OTHER): Payer: Medicare Other | Admitting: Cardiovascular Disease

## 2015-03-24 ENCOUNTER — Encounter: Payer: Self-pay | Admitting: Cardiovascular Disease

## 2015-03-24 VITALS — BP 140/78 | HR 64 | Ht 72.0 in | Wt 220.0 lb

## 2015-03-24 DIAGNOSIS — I25118 Atherosclerotic heart disease of native coronary artery with other forms of angina pectoris: Secondary | ICD-10-CM | POA: Diagnosis not present

## 2015-03-24 MED ORDER — ISOSORBIDE MONONITRATE ER 30 MG PO TB24: 30.0000 mg | ORAL_TABLET | Freq: Every day | ORAL | Status: DC

## 2015-03-24 NOTE — Progress Notes (Signed)
Cardiology Office Note Date:  03/24/2015   ID:  [name] [birth year], MRN [id number]  PCP:  Steve Kilts, MD  Cardiologist:  Steve Mocha, MD    Chief Complaint  Patient presents with  . scheduled office visit    Has had some chest pain. Took 1 dose of Nitrostat and pain resolved.Swelling in legs, sob. Denies any claudicaiton  . Aortic Stenosis  . Coronary Artery Disease     History of Present Illness: [name] is a 70 y.o. male who presents for Follow-up of coronary artery disease. The patient has a history of two-vessel CABG with a LIMA to LAD and RIMA to RCA in 1997. He most recently underwent PCI in 2013 with a drug-eluting stent in the ramus intermedius. Coronary bypass grafts were patent at that time and LVEF was normal.  When I saw him last year he was having no angina with exertion. Over the past 3 weeks he reports recurrent chest discomfort. He has taken NTG x 2 and sx's have resolved within a few minutes. When he has gone to the gym and worked out on the elliptical he has chest discomfort with a heart rate of 110 bpm and he has had to 'back off.' He hasn't had any resting angina. There is mild associated dyspnea. No other complaints today. No lightheadedness, heart palpitations, leg swelling, orthopnea, or PND. He describes a pressure in his chest that resolves with rest and/or nitroglycerin. It is nonradiating and not exacerbated by anything except physical exertion.  Past Medical History  Diagnosis Date  . Coronary artery disease     a. s/p CABG x 2 (LIMA->LAD, RIMA->RCA);  b. s/p multiple PCI's to Ramus;  c. 08/2011 Cath/PCI: LM 70% into ramus with 70-80% there->treated wtih 3.5x18 Xience Xpedition DES, LCX  nonobs, RCA occluded.  RIMA & LIMA patent, EF 55-65%  . Hypertension   . Hyperlipidemia   . Hypothyroidism   . DJD (degenerative joint disease)   . Chronic back pain     Past Surgical History  Procedure Laterality Date  . Coronary  artery bypass graft  1997    with (LIMA)  . Knee surgery      bilateral  . Hernia repair    . Robot assisted laparoscopic radical prostatectomy    . Colonoscopy N/A 12/28/2012    Procedure: COLONOSCOPY;  Surgeon: Steve Houston, MD;  Location: [facility];  Service: Endoscopy;  Laterality: N/A;  830 rescheduled  . Left heart catheterization with coronary angiogram N/A 09/08/2011    Procedure: LEFT HEART CATHETERIZATION WITH CORONARY ANGIOGRAM;  Surgeon: Steve Mocha, MD;  Location: [facility] CATH LAB;  Service: Cardiovascular;  Laterality: N/A;  . Percutaneous coronary stent intervention (pci-s) Right 09/08/2011    Procedure: PERCUTANEOUS CORONARY STENT INTERVENTION (PCI-S);  Surgeon: Steve Mocha, MD;  Location: [facility] CATH LAB;  Service: Cardiovascular;  Laterality: Right;    Current Outpatient Prescriptions  Medication Sig Dispense Refill  . amLODipine (NORVASC) 5 MG tablet TAKE ONE TABLET BY MOUTH EVERY DAY 90 tablet 3  . aspirin 81 MG tablet Take 81 mg by mouth at bedtime.     . celecoxib (CELEBREX) 200 MG capsule Take 1 capsule (200 mg total) by mouth daily.    . clopidogrel (PLAVIX) 75 MG tablet Take 1 tablet (75 mg total) by mouth daily. 1 tablet 0  . Coenzyme Q10 (COQ10) 200 MG CAPS Take 1 capsule by mouth daily.    . colesevelam (WELCHOL) 625 MG tablet Take  by mouth daily as needed. For cholesterol; "I only take Welchol when I don't stick to my diet"    . Cyanocobalamin (VITAMIN B-12 IJ) Inject as directed every 30 (thirty) days.    Marland Kitchen gabapentin (NEURONTIN) 600 MG tablet Take 600 mg by mouth 3 (three) times daily.  3  . levothyroxine (SYNTHROID, LEVOTHROID) 150 MCG tablet Take 150 mcg by mouth daily before breakfast.    . lisinopril (PRINIVIL,ZESTRIL) 5 MG tablet TAKE 1 TABLET BY MOUTH EVERY NIGHT AT BEDTIME 90 tablet 2  . metoprolol tartrate (LOPRESSOR) 25 MG tablet Take 25 mg by mouth daily.     . Multiple Vitamin (MULTIVITAMIN) tablet Take 1 tablet by mouth every other day.     .  nitroGLYCERIN (NITROSTAT) 0.4 MG SL tablet Place 1 tablet (0.4 mg total) under the tongue every 5 (five) minutes as needed. For chest pain 25 tablet 3  . Omega-3 Fatty Acids (FISH OIL) 1200 MG CAPS Take 2 capsules by mouth 2 (two) times daily.     . traMADol (ULTRAM) 50 MG tablet Take 50 mg by mouth every 8 (eight) hours as needed for moderate pain.   1  . isosorbide mononitrate (IMDUR) 30 MG 24 hr tablet Take 1 tablet (30 mg total) by mouth daily. 30 tablet 3   No current facility-administered medications for this visit.    Allergies:   Codeine   Social History:  The patient  reports that he has quit smoking. He does not have any smokeless tobacco history on file. He reports that he drinks alcohol. He reports that he does not use illicit drugs.   Family History:  The patient's  family history includes Cancer (age of onset: 67) in his father; Stroke (age of onset: 76) in his mother.    ROS:  Please see the history of present illness.  Otherwise, review of systems is positive for chest pain, leg swelling, back pain, dizziness, easy bruising, leg pain, joint swelling, and balance problems.  All other systems are reviewed and negative.    PHYSICAL EXAM: VS:  BP 140/78 mmHg  Pulse 64  Ht 6' (1.829 m)  Wt 220 lb (99.791 kg)  BMI 29.83 kg/m2 , BMI Body mass index is 29.83 kg/(m^2). GEN: Well nourished, well developed, in no acute distress HEENT: normal Neck: no JVD, no masses. No carotid bruits Cardiac: RRR with 2/6 SEM at the LUSB, no diastolic murmur or gallop              Respiratory:  clear to auscultation bilaterally, normal work of breathing GI: soft, nontender, nondistended, + BS MS: no deformity or atrophy Ext: no pretibial edema, pedal pulses 2+= bilaterally Skin: warm and dry, no rash Neuro:  Strength and sensation are intact Psych: euthymic mood, full affect  EKG:  EKG is ordered today. The ekg ordered today shows NSR 62 bpm, incomplete RBBB, age-indeterminate inferior  infarct  Recent Labs: No results found for requested labs within last 365 days.   Lipid Panel  No results found for: CHOL, TRIG, HDL, CHOLHDL, VLDL, LDLCALC, LDLDIRECT    Wt Readings from Last 3 Encounters:  03/24/15 220 lb (99.791 kg)  01/24/14 213 lb 1.9 oz (96.671 kg)  12/28/12 226 lb (102.513 kg)     Cardiac Studies Reviewed: Cardiac Cath 09/08/2011: Procedure: Left Heart Cath, Selective Coronary Angiography, LV angiography, PTCA/Stent of the Left Main into the Ramus Intermedius, PerClose of the RFA  Indication: Unstable Angina. 70 year old gentleman with known CAD status post CABG and  multiple PCI procedures. He has episodic chronic angina. However, he has noted progressive symptoms over the last few days with dyspnea on exertion has a past anginal equivalent. Dyspnea and chest tightness have been with minimal activity. He was admitted with unstable angina and referred for cardiac catheterization and possible PCI.  Diagnostic Procedure Details: The right groin was prepped, draped, and anesthetized with 1% lidocaine. Using the modified Seldinger technique, a 5 French sheath was introduced into the right femoral artery. Standard Judkins catheters were used for selective coronary angiography and left ventriculography. LIMA and free RIMA angiography were performed with the JR 4 catheter. Catheter exchanges were performed over a wire. The diagnostic procedure was well-tolerated without immediate complications.  PROCEDURAL FINDINGS Hemodynamics: AO 150/74 LV 153/19  Coronary angiography: Coronary dominance: right  Left mainstem: The left main is moderately calcified. There is diffuse plaque in the proximal and mid shaft of the left main and the vessel then tapers distally into a 70% distal left main stenosis. This is within a previously implanted stent which is difficult to visualize. There is a complex area just beyond the left main trifurcation leading into the ramus  intermedius which has severe 80-90% stenosis, clearly progressive compared to the previous study from 2008.  Left anterior descending (LAD): The LAD is severely diseased. It occludes just after the first diagonal.  Left circumflex (LCx): The AV groove circumflex is patent. There is an intermediate branch which has been heavily stented. There is a long stented segment that has diffuse 50% stenosis. There is an overlap stent extending beyond the easily visible stent that also has diffuse 30-40% stenosis. Distally the vessel bifurcates into twin branches and has 60% stenosis into both branches. This is unchanged from the previous study. The area of severe stenosis occurs at the proximal portion of the intermediate branch and this is in an area of previously overlapped drug-eluting and bare-metal stent. The AV groove circumflex has no significant disease  Right coronary artery (RCA): The RCA is severely diseased until it occludes in its midportion.  LIMA to LAD: The vessel is widely patent. The distal anastomotic site is patent. The mid and distal LAD have diffuse mild to moderate plaque without high-grade focal stenosis.  Free RIMA to distal RCA is patent. There is no obstructive disease throughout the graft. The distal anastomotic site is widely patent. The PDA and posterolateral branches are patent.  Left ventriculography: Left ventricular systolic function is normal, LVEF is estimated at 55-65%, there is no significant mitral regurgitation   PCI Procedure Note: Following the diagnostic procedure, the decision was made to proceed with PCI. The sheath was upsized to a 6 [place]. Weight-based bivalirudin was given for anticoagulation. Once a therapeutic ACT was achieved, a 6 [place] XB LAD 3.5 cm guide catheter was inserted. A cougar coronary guidewire was used to cross the lesion. The lesion was predilated with a 3.0 x 15 mm balloon. The lesion was then stented with a 3.5 x 18 mm Xience Xpedition  drug-eluting stent. Care was taken to treat back into the left main and cover the entire area of overlap into the ramus. The stent was postdilated with a 4.0x15 mm noncompliant balloon to 16 atm. Following PCI, there was 0% residual stenosis and TIMI-3 flow. Final angiography confirmed an excellent result. Femoral hemostasis was achieved with a PerClose device. The patient tolerated the PCI procedure well. There were no immediate procedural complications. The patient was transferred to the post catheterization recovery area for further monitoring.  PCI Data: Vessel - left main/Segment - distal, extending into the ramus intermedius Percent Stenosis (pre) 80 TIMI-flow 3 Stent 3.5 x 18 mm drug-eluting Percent Stenosis (post) 0 TIMI-flow (post) 3  Final Conclusions:   1. Severe native three-vessel coronary artery disease 2. Status post coronary bypass surgery with continued patency of the LIMA to LAD and free RIMA to RCA 3. Preserved LV systolic function 4. Successful PCI of the distal left main into the ramus intermedius  Recommendations: Continued dual antiplatelet therapy with aspirin and Plavix indefinitely  ASSESSMENT AND PLAN: 1.  CAD, native vessel, with exertional angina (CCS class II symptoms). I reviewed the patient's last cardiac catheterization study with him. At that time he had patency of the LIMA and free RIMA grafts. He underwent PCI of the native left main into the stented segment of the ramus intermedius with a 3.5 x 18 mm Xience DES postdilated to 4 mm. He has done well until the last 3-4 weeks. Reviewed treatment options with him today. Will start him on isosorbide 30 mg daily and order an exercise stress Myoview next week for further risk stratification. Consider cardiac catheterization/possible PCI if symptoms persist or if nuclear scan is high risk. He should continue on long-term dual antiplatelet therapy with aspirin and Plavix.  2. Essential hypertension: Blood  pressure is controlled on multidrug therapy with amlodipine, lisinopril, and metoprolol.  3. Hyperlipidemia: The patient is statin intolerant. He is treated with WelChol.  Current medicines are reviewed with the patient today.  The patient does not have concerns regarding medicines.  Labs/ tests ordered today include:   Orders Placed This Encounter  Procedures  . Myocardial Perfusion Imaging  . EKG 12-Lead   Disposition:   FU pending stress test results  Signed, Steve Mocha, MD  03/24/2015 11:31 AM    [facility] [address], [place], [place]  [address] Phone: [phone]; Fax: [phone]

## 2015-03-24 NOTE — Patient Instructions (Signed)
Medication Instructions:  Your physician has recommended you make the following change in your medication:  1. START Isosorbide MN 30mg  take one tablet by mouth daily  Labwork: No new orders.   Testing/Procedures: Your physician has requested that you have an exercise stress myoview in 1 WEEK. For further information please visit [email or web address]. Please follow instruction sheet, as given.  Follow-Up: Your physician wants you to follow-up in: 6 MONTHS with Dr Burt Knack.  You will receive a reminder letter in the mail two months in advance. If you don't receive a letter, please call our office to schedule the follow-up appointment.   Any Other Special Instructions Will Be Listed Below (If Applicable).     If you need a refill on your cardiac medications before your next appointment, please call your pharmacy.

## 2015-03-26 ENCOUNTER — Other Ambulatory Visit: Payer: Self-pay | Admitting: Cardiovascular Disease

## 2015-03-27 ENCOUNTER — Telehealth (HOSPITAL_COMMUNITY): Payer: Self-pay | Admitting: *Deleted

## 2015-03-27 NOTE — Telephone Encounter (Signed)
Left message on voicemail in reference to upcoming appointment scheduled for 04/01/15. Phone number given for a call back so details instructions can be given. Traeh Milroy J Sheza Strickland, RN  

## 2015-03-31 ENCOUNTER — Telehealth (HOSPITAL_COMMUNITY): Payer: Self-pay | Admitting: *Deleted

## 2015-03-31 NOTE — Telephone Encounter (Signed)
Patient given detailed instructions per Myocardial Perfusion Study Information Sheet for the test on 04/01/15 at 0945. Patient notified to arrive 15 minutes early and that it is imperative to arrive on time for appointment to keep from having the test rescheduled.  If you need to cancel or reschedule your appointment, please call the office within 24 hours of your appointment. Failure to do so may result in a cancellation of your appointment, and a $50 no show fee. Patient verbalized understanding.Aleina Burgio, Ranae Palms

## 2015-04-01 ENCOUNTER — Ambulatory Visit (HOSPITAL_COMMUNITY): Payer: Medicare Other | Attending: Cardiovascular Disease

## 2015-04-01 DIAGNOSIS — R9439 Abnormal result of other cardiovascular function study: Secondary | ICD-10-CM | POA: Insufficient documentation

## 2015-04-01 DIAGNOSIS — R0609 Other forms of dyspnea: Secondary | ICD-10-CM | POA: Diagnosis not present

## 2015-04-01 DIAGNOSIS — I25118 Atherosclerotic heart disease of native coronary artery with other forms of angina pectoris: Secondary | ICD-10-CM | POA: Insufficient documentation

## 2015-04-01 DIAGNOSIS — I4510 Unspecified right bundle-branch block: Secondary | ICD-10-CM | POA: Insufficient documentation

## 2015-04-01 DIAGNOSIS — R079 Chest pain, unspecified: Secondary | ICD-10-CM | POA: Insufficient documentation

## 2015-04-01 LAB — MYOCARDIAL PERFUSION IMAGING
CHL CUP MPHR: 148 {beats}/min
CHL CUP NUCLEAR SRS: 2
CHL CUP RESTING HR STRESS: 56 {beats}/min
CSEPED: 31 min
CSEPEDS: 9 s
CSEPEW: 9.4 METS
CSEPPHR: 136 {beats}/min
LV sys vol: 39 mL
LVDIAVOL: 95 mL (ref 62–150)
Percent HR: 91 %
RATE: 0.28
RPE: 19
Rest BP: 7 mmHg
SDS: 6
SSS: 8
TID: 0.91

## 2015-04-01 MED ORDER — TECHNETIUM TC 99M SESTAMIBI GENERIC - CARDIOLITE
10.5000 | Freq: Once | INTRAVENOUS | Status: AC | PRN
  Administered 2015-04-01: 11 via INTRAVENOUS

## 2015-04-01 MED ORDER — TECHNETIUM TC 99M SESTAMIBI GENERIC - CARDIOLITE
32.6000 | Freq: Once | INTRAVENOUS | Status: AC | PRN
  Administered 2015-04-01: 33 via INTRAVENOUS

## 2015-04-08 ENCOUNTER — Other Ambulatory Visit: Payer: Self-pay | Admitting: Cardiovascular Disease

## 2015-04-08 DIAGNOSIS — R931 Abnormal findings on diagnostic imaging of heart and coronary circulation: Secondary | ICD-10-CM | POA: Diagnosis present

## 2015-04-10 ENCOUNTER — Encounter (HOSPITAL_COMMUNITY): Payer: Self-pay | Admitting: General Practice

## 2015-04-10 ENCOUNTER — Encounter (HOSPITAL_COMMUNITY)
Admission: RE | Disposition: A | Discharge status: Home / Self Care | Payer: Self-pay | Source: Ambulatory Visit | Attending: Cardiovascular Disease

## 2015-04-10 ENCOUNTER — Ambulatory Visit (HOSPITAL_COMMUNITY)
Admission: RE | Admit: 2015-04-10 | Discharge: 2015-04-11 | Disposition: A | Discharge status: Home / Self Care | Payer: Medicare Other | Source: Ambulatory Visit | Attending: Cardiovascular Disease | Admitting: Cardiovascular Disease

## 2015-04-10 DIAGNOSIS — I208 Other forms of angina pectoris: Secondary | ICD-10-CM | POA: Diagnosis present

## 2015-04-10 DIAGNOSIS — Z7982 Long term (current) use of aspirin: Secondary | ICD-10-CM | POA: Insufficient documentation

## 2015-04-10 DIAGNOSIS — I25118 Atherosclerotic heart disease of native coronary artery with other forms of angina pectoris: Secondary | ICD-10-CM | POA: Diagnosis not present

## 2015-04-10 DIAGNOSIS — I350 Nonrheumatic aortic (valve) stenosis: Secondary | ICD-10-CM | POA: Diagnosis not present

## 2015-04-10 DIAGNOSIS — I2582 Chronic total occlusion of coronary artery: Secondary | ICD-10-CM | POA: Insufficient documentation

## 2015-04-10 DIAGNOSIS — E785 Hyperlipidemia, unspecified: Secondary | ICD-10-CM | POA: Diagnosis not present

## 2015-04-10 DIAGNOSIS — I2510 Atherosclerotic heart disease of native coronary artery without angina pectoris: Secondary | ICD-10-CM

## 2015-04-10 DIAGNOSIS — E039 Hypothyroidism, unspecified: Secondary | ICD-10-CM | POA: Diagnosis not present

## 2015-04-10 DIAGNOSIS — R931 Abnormal findings on diagnostic imaging of heart and coronary circulation: Secondary | ICD-10-CM | POA: Diagnosis present

## 2015-04-10 DIAGNOSIS — Z7902 Long term (current) use of antithrombotics/antiplatelets: Secondary | ICD-10-CM | POA: Diagnosis not present

## 2015-04-10 DIAGNOSIS — I10 Essential (primary) hypertension: Secondary | ICD-10-CM | POA: Insufficient documentation

## 2015-04-10 DIAGNOSIS — Z87891 Personal history of nicotine dependence: Secondary | ICD-10-CM | POA: Insufficient documentation

## 2015-04-10 DIAGNOSIS — I2089 Other forms of angina pectoris: Secondary | ICD-10-CM | POA: Diagnosis present

## 2015-04-10 DIAGNOSIS — M549 Dorsalgia, unspecified: Secondary | ICD-10-CM | POA: Diagnosis not present

## 2015-04-10 DIAGNOSIS — Z955 Presence of coronary angioplasty implant and graft: Secondary | ICD-10-CM | POA: Diagnosis not present

## 2015-04-10 DIAGNOSIS — G8929 Other chronic pain: Secondary | ICD-10-CM | POA: Insufficient documentation

## 2015-04-10 HISTORY — DX: Low back pain, unspecified: M54.50

## 2015-04-10 HISTORY — DX: Low back pain: M54.5

## 2015-04-10 HISTORY — DX: Dependence on other enabling machines and devices: Z99.89

## 2015-04-10 HISTORY — DX: Malignant neoplasm of prostate: C61

## 2015-04-10 HISTORY — DX: Unspecified osteoarthritis, unspecified site: M19.90

## 2015-04-10 HISTORY — PX: CARDIAC CATHETERIZATION: SHX172

## 2015-04-10 HISTORY — DX: Personal history of other diseases of the musculoskeletal system and connective tissue: Z87.39

## 2015-04-10 HISTORY — DX: Obstructive sleep apnea (adult) (pediatric): G47.33

## 2015-04-10 HISTORY — DX: Angina pectoris, unspecified: I20.9

## 2015-04-10 HISTORY — DX: Other chronic pain: G89.29

## 2015-04-10 LAB — BASIC METABOLIC PANEL
ANION GAP: 8 (ref 5–15)
BUN: 17 mg/dL (ref 6–20)
CHLORIDE: 105 mmol/L (ref 101–111)
CO2: 27 mmol/L (ref 22–32)
Calcium: 9.2 mg/dL (ref 8.9–10.3)
Creatinine, Ser: 1.06 mg/dL (ref 0.61–1.24)
GFR calc non Af Amer: >60 mL/min (ref >60)
Glucose, Bld: 103 mg/dL — ABNORMAL HIGH (ref 65–99)
POTASSIUM: 4.2 mmol/L (ref 3.5–5.1)
SODIUM: 140 mmol/L (ref 135–145)

## 2015-04-10 LAB — CBC
HEMATOCRIT: 38 % — AB (ref 39.0–52.0)
HEMOGLOBIN: 12.9 g/dL — AB (ref 13.0–17.0)
MCH: 32.7 pg (ref 26.0–34.0)
MCHC: 33.9 g/dL (ref 30.0–36.0)
MCV: 96.2 fL (ref 78.0–100.0)
Platelets: 168 10*3/uL (ref 150–400)
RBC: 3.95 MIL/uL — ABNORMAL LOW (ref 4.22–5.81)
RDW: 13.1 % (ref 11.5–15.5)
WBC: 4.3 10*3/uL (ref 4.0–10.5)

## 2015-04-10 LAB — POCT ACTIVATED CLOTTING TIME
ACTIVATED CLOTTING TIME: 260 s
Activated Clotting Time: 245 seconds

## 2015-04-10 LAB — PROTIME-INR
INR: 1 (ref 0.00–1.49)
Prothrombin Time: 13.4 seconds (ref 11.6–15.2)

## 2015-04-10 SURGERY — LEFT HEART CATH AND CORS/GRAFTS ANGIOGRAPHY

## 2015-04-10 MED ORDER — SODIUM CHLORIDE 0.9 % WEIGHT BASED INFUSION
1.0000 mL/kg/h | INTRAVENOUS | Status: DC
  Administered 2015-04-10: 1 mL/kg/h via INTRAVENOUS

## 2015-04-10 MED ORDER — LIDOCAINE HCL (PF) 1 % IJ SOLN
INTRAMUSCULAR | Status: AC
  Filled 2015-04-10: qty 30

## 2015-04-10 MED ORDER — HEPARIN (PORCINE) IN NACL 2-0.9 UNIT/ML-% IJ SOLN
INTRAMUSCULAR | Status: DC | PRN
  Administered 2015-04-10: 1000 mL

## 2015-04-10 MED ORDER — SODIUM CHLORIDE 0.9 % WEIGHT BASED INFUSION
3.0000 mL/kg/h | INTRAVENOUS | Status: DC
  Administered 2015-04-10: 3 mL/kg/h via INTRAVENOUS

## 2015-04-10 MED ORDER — LIDOCAINE HCL (PF) 1 % IJ SOLN
INTRAMUSCULAR | Status: DC | PRN
  Administered 2015-04-10: 4 mL

## 2015-04-10 MED ORDER — ASPIRIN 81 MG PO CHEW
81.0000 mg | CHEWABLE_TABLET | ORAL | Status: AC
  Administered 2015-04-10: 81 mg via ORAL

## 2015-04-10 MED ORDER — AMLODIPINE BESYLATE 5 MG PO TABS
5.0000 mg | ORAL_TABLET | Freq: Every day | ORAL | Status: DC
  Administered 2015-04-11: 5 mg via ORAL
  Filled 2015-04-10: qty 1

## 2015-04-10 MED ORDER — ADULT MULTIVITAMIN W/MINERALS CH
1.0000 | ORAL_TABLET | ORAL | Status: DC
  Administered 2015-04-11: 1 via ORAL
  Filled 2015-04-10: qty 1

## 2015-04-10 MED ORDER — METOPROLOL TARTRATE 25 MG PO TABS
25.0000 mg | ORAL_TABLET | Freq: Every day | ORAL | Status: DC
  Administered 2015-04-10 – 2015-04-11 (×2): 25 mg via ORAL
  Filled 2015-04-10 (×2): qty 1

## 2015-04-10 MED ORDER — NITROGLYCERIN 1 MG/10 ML FOR IR/CATH LAB
INTRA_ARTERIAL | Status: AC
  Filled 2015-04-10: qty 10

## 2015-04-10 MED ORDER — TRAMADOL HCL 50 MG PO TABS
50.0000 mg | ORAL_TABLET | Freq: Once | ORAL | Status: AC
  Administered 2015-04-11: 50 mg via ORAL
  Filled 2015-04-10: qty 1

## 2015-04-10 MED ORDER — GABAPENTIN 600 MG PO TABS
600.0000 mg | ORAL_TABLET | Freq: Once | ORAL | Status: AC
  Administered 2015-04-11: 600 mg via ORAL
  Filled 2015-04-10: qty 1

## 2015-04-10 MED ORDER — MIDAZOLAM HCL 2 MG/2ML IJ SOLN
INTRAMUSCULAR | Status: DC | PRN
  Administered 2015-04-10: 2 mg via INTRAVENOUS

## 2015-04-10 MED ORDER — IOPAMIDOL (ISOVUE-370) INJECTION 76%
INTRAVENOUS | Status: AC
  Filled 2015-04-10: qty 100

## 2015-04-10 MED ORDER — HEPARIN SODIUM (PORCINE) 1000 UNIT/ML IJ SOLN
INTRAMUSCULAR | Status: DC | PRN
  Administered 2015-04-10 (×2): 5000 [IU] via INTRAVENOUS
  Administered 2015-04-10: 3000 [IU] via INTRAVENOUS
  Administered 2015-04-10: 4000 [IU] via INTRAVENOUS

## 2015-04-10 MED ORDER — HEPARIN SODIUM (PORCINE) 1000 UNIT/ML IJ SOLN
INTRAMUSCULAR | Status: AC
  Filled 2015-04-10: qty 1

## 2015-04-10 MED ORDER — TRAMADOL HCL 50 MG PO TABS
50.0000 mg | ORAL_TABLET | Freq: Three times a day (TID) | ORAL | Status: DC | PRN
  Administered 2015-04-10: 20:00:00 50 mg via ORAL
  Filled 2015-04-10: qty 1

## 2015-04-10 MED ORDER — LEVOTHYROXINE SODIUM 75 MCG PO TABS
150.0000 ug | ORAL_TABLET | Freq: Every day | ORAL | Status: DC
  Administered 2015-04-11: 10:00:00 150 ug via ORAL
  Filled 2015-04-10: qty 2

## 2015-04-10 MED ORDER — MIDAZOLAM HCL 2 MG/2ML IJ SOLN
INTRAMUSCULAR | Status: AC
  Filled 2015-04-10: qty 2

## 2015-04-10 MED ORDER — SODIUM CHLORIDE 0.9 % IV SOLN: 250.0000 mL | INTRAVENOUS | Status: DC | PRN

## 2015-04-10 MED ORDER — COQ10 200 MG PO CAPS: 200.0000 mg | ORAL_CAPSULE | Freq: Every day | ORAL | Status: DC

## 2015-04-10 MED ORDER — SODIUM CHLORIDE 0.9% FLUSH: 3.0000 mL | INTRAVENOUS | Status: DC | PRN

## 2015-04-10 MED ORDER — ISOSORBIDE MONONITRATE ER 30 MG PO TB24
30.0000 mg | ORAL_TABLET | Freq: Every day | ORAL | Status: DC
  Administered 2015-04-11: 30 mg via ORAL
  Filled 2015-04-10: qty 1

## 2015-04-10 MED ORDER — FENTANYL CITRATE (PF) 100 MCG/2ML IJ SOLN
INTRAMUSCULAR | Status: DC | PRN
  Administered 2015-04-10: 25 ug via INTRAVENOUS

## 2015-04-10 MED ORDER — VERAPAMIL HCL 2.5 MG/ML IV SOLN
INTRAVENOUS | Status: AC
  Filled 2015-04-10: qty 2

## 2015-04-10 MED ORDER — NITROGLYCERIN 1 MG/10 ML FOR IR/CATH LAB
INTRA_ARTERIAL | Status: DC | PRN
  Administered 2015-04-10 (×2): 150 ug via INTRACORONARY
  Administered 2015-04-10 (×2): 200 ug via INTRACORONARY

## 2015-04-10 MED ORDER — FENTANYL CITRATE (PF) 100 MCG/2ML IJ SOLN
INTRAMUSCULAR | Status: AC
  Filled 2015-04-10: qty 2

## 2015-04-10 MED ORDER — IOPAMIDOL (ISOVUE-370) INJECTION 76%
INTRAVENOUS | Status: DC | PRN
  Administered 2015-04-10: 225 mL via INTRAVENOUS

## 2015-04-10 MED ORDER — HEPARIN (PORCINE) IN NACL 2-0.9 UNIT/ML-% IJ SOLN
INTRAMUSCULAR | Status: AC
  Filled 2015-04-10: qty 1000

## 2015-04-10 MED ORDER — IOPAMIDOL (ISOVUE-370) INJECTION 76%
INTRAVENOUS | Status: AC
  Filled 2015-04-10: qty 50

## 2015-04-10 MED ORDER — HEPARIN SODIUM (PORCINE) 1000 UNIT/ML IJ SOLN
INTRAMUSCULAR | Status: AC
Start: 2015-04-10 — End: 2015-04-10
  Filled 2015-04-10: qty 1

## 2015-04-10 MED ORDER — ACETAMINOPHEN 325 MG PO TABS: 650.0000 mg | ORAL_TABLET | ORAL | Status: DC | PRN

## 2015-04-10 MED ORDER — SODIUM CHLORIDE 0.9% FLUSH: 3.0000 mL | Freq: Two times a day (BID) | INTRAVENOUS | Status: DC

## 2015-04-10 MED ORDER — TICAGRELOR 90 MG PO TABS
90.0000 mg | ORAL_TABLET | Freq: Two times a day (BID) | ORAL | Status: DC
  Administered 2015-04-11: 10:00:00 90 mg via ORAL
  Filled 2015-04-10: qty 1

## 2015-04-10 MED ORDER — ONDANSETRON HCL 4 MG/2ML IJ SOLN: 4.0000 mg | Freq: Four times a day (QID) | INTRAMUSCULAR | Status: DC | PRN

## 2015-04-10 MED ORDER — ASPIRIN 81 MG PO CHEW
CHEWABLE_TABLET | ORAL | Status: AC
  Administered 2015-04-10: 81 mg via ORAL
  Filled 2015-04-10: qty 1

## 2015-04-10 MED ORDER — GABAPENTIN 600 MG PO TABS
600.0000 mg | ORAL_TABLET | Freq: Three times a day (TID) | ORAL | Status: DC
  Administered 2015-04-10 – 2015-04-11 (×2): 600 mg via ORAL
  Filled 2015-04-10 (×2): qty 1

## 2015-04-10 MED ORDER — SODIUM CHLORIDE 0.9 % IV SOLN: INTRAVENOUS | Status: AC

## 2015-04-10 MED ORDER — SODIUM CHLORIDE 0.9% FLUSH
3.0000 mL | Freq: Two times a day (BID) | INTRAVENOUS | Status: DC
  Administered 2015-04-10: 20:00:00 3 mL via INTRAVENOUS

## 2015-04-10 MED ORDER — CELECOXIB 200 MG PO CAPS
200.0000 mg | ORAL_CAPSULE | Freq: Every day | ORAL | Status: DC
  Filled 2015-04-10 (×2): qty 1

## 2015-04-10 MED ORDER — ASPIRIN EC 81 MG PO TBEC: 81.0000 mg | DELAYED_RELEASE_TABLET | Freq: Every day | ORAL | Status: DC

## 2015-04-10 MED ORDER — LISINOPRIL 5 MG PO TABS
5.0000 mg | ORAL_TABLET | Freq: Every day | ORAL | Status: DC
  Administered 2015-04-10: 5 mg via ORAL
  Filled 2015-04-10: qty 1

## 2015-04-10 MED ORDER — CLOPIDOGREL BISULFATE 75 MG PO TABS
75.0000 mg | ORAL_TABLET | Freq: Every day | ORAL | Status: DC
Start: 2015-04-11 — End: 2015-04-10

## 2015-04-10 MED ORDER — VERAPAMIL HCL 2.5 MG/ML IV SOLN
INTRAVENOUS | Status: DC | PRN
  Administered 2015-04-10: 10 mL via INTRA_ARTERIAL

## 2015-04-10 SURGICAL SUPPLY — 19 items
BALLN EUPHORA RX 2.0X12 (BALLOONS) ×3
BALLN EUPHORA RX 2.0X15 (BALLOONS) ×3
BALLOON EUPHORA RX 2.0X12 (BALLOONS) ×1 IMPLANT
BALLOON EUPHORA RX 2.0X15 (BALLOONS) ×1 IMPLANT
CATH EXPO 5F MPA-1 (CATHETERS) ×3 IMPLANT
CATH INFINITI JR4 5F (CATHETERS) ×3 IMPLANT
GLIDESHEATH SLEND SS 6F .021 (SHEATH) ×3 IMPLANT
GUIDE CATH RUNWAY 6FR MP1 (CATHETERS) ×3 IMPLANT
KIT ENCORE 26 ADVANTAGE (KITS) ×3 IMPLANT
KIT HEART LEFT (KITS) ×3 IMPLANT
PACK CARDIAC CATHETERIZATION (CUSTOM PROCEDURE TRAY) ×3 IMPLANT
STENT RESOLUTE INTEG 2.25X18 (Permanent Stent) ×3 IMPLANT
STENT RESOLUTE INTEG 2.5X18 (Permanent Stent) ×3 IMPLANT
TRANSDUCER W/STOPCOCK (MISCELLANEOUS) ×3 IMPLANT
TUBING CIL FLEX 10 FLL-RA (TUBING) ×3 IMPLANT
WIRE COUGAR XT STRL 190CM (WIRE) ×3 IMPLANT
WIRE HI TORQ WHISPER MS 190CM (WIRE) ×6 IMPLANT
WIRE HI TORQ WHISPER MS 300CM (WIRE) ×3 IMPLANT
WIRE SAFE-T 1.5MM-J .035X260CM (WIRE) ×3 IMPLANT

## 2015-04-10 NOTE — H&P (View-Only) (Signed)
Cardiology Office Note Date:  03/24/2015   ID:  [name], [name] [birth year], MRN [id number]  PCP:  Purvis Kilts, MD  Cardiologist:  Sherren Mocha, MD    Chief Complaint  Patient presents with  . scheduled office visit    Has had some chest pain. Took 1 dose of Nitrostat and pain resolved.Swelling in legs, sob. Denies any claudicaiton  . Aortic Stenosis  . Coronary Artery Disease     History of Present Illness: [name] is a 70 y.o. male who presents for Follow-up of coronary artery disease. The patient has a history of two-vessel CABG with a LIMA to LAD and RIMA to RCA in 1997. He most recently underwent PCI in 2013 with a drug-eluting stent in the ramus intermedius. Coronary bypass grafts were patent at that time and LVEF was normal.  When I saw him last year he was having no angina with exertion. Over the past 3 weeks he reports recurrent chest discomfort. He has taken NTG x 2 and sx's have resolved within a few minutes. When he has gone to the gym and worked out on the elliptical he has chest discomfort with a heart rate of 110 bpm and he has had to 'back off.' He hasn't had any resting angina. There is mild associated dyspnea. No other complaints today. No lightheadedness, heart palpitations, leg swelling, orthopnea, or PND. He describes a pressure in his chest that resolves with rest and/or nitroglycerin. It is nonradiating and not exacerbated by anything except physical exertion.  Past Medical History  Diagnosis Date  . Coronary artery disease     a. s/p CABG x 2 (LIMA->LAD, RIMA->RCA);  b. s/p multiple PCI's to Ramus;  c. 08/2011 Cath/PCI: LM 70% into ramus with 70-80% there->treated wtih 3.5x18 Xience Xpedition DES, LCX  nonobs, RCA occluded.  RIMA & LIMA patent, EF 55-65%  . Hypertension   . Hyperlipidemia   . Hypothyroidism   . DJD (degenerative joint disease)   . Chronic back pain     Past Surgical History  Procedure Laterality Date  . Coronary  artery bypass graft  1997    with (LIMA)  . Knee surgery      bilateral  . Hernia repair    . Robot assisted laparoscopic radical prostatectomy    . Colonoscopy N/A 12/28/2012    Procedure: COLONOSCOPY;  Surgeon: Rogene Houston, MD;  Location: [facility];  Service: Endoscopy;  Laterality: N/A;  830 rescheduled  . Left heart catheterization with coronary angiogram N/A 09/08/2011    Procedure: LEFT HEART CATHETERIZATION WITH CORONARY ANGIOGRAM;  Surgeon: Sherren Mocha, MD;  Location: [facility] CATH LAB;  Service: Cardiovascular;  Laterality: N/A;  . Percutaneous coronary stent intervention (pci-s) Right 09/08/2011    Procedure: PERCUTANEOUS CORONARY STENT INTERVENTION (PCI-S);  Surgeon: Sherren Mocha, MD;  Location: [facility] CATH LAB;  Service: Cardiovascular;  Laterality: Right;    Current Outpatient Prescriptions  Medication Sig Dispense Refill  . amLODipine (NORVASC) 5 MG tablet TAKE ONE TABLET BY MOUTH EVERY DAY 90 tablet 3  . aspirin 81 MG tablet Take 81 mg by mouth at bedtime.     . celecoxib (CELEBREX) 200 MG capsule Take 1 capsule (200 mg total) by mouth daily.    . clopidogrel (PLAVIX) 75 MG tablet Take 1 tablet (75 mg total) by mouth daily. 1 tablet 0  . Coenzyme Q10 (COQ10) 200 MG CAPS Take 1 capsule by mouth daily.    . colesevelam (WELCHOL) 625 MG tablet Take  by mouth daily as needed. For cholesterol; "I only take Welchol when I don't stick to my diet"    . Cyanocobalamin (VITAMIN B-12 IJ) Inject as directed every 30 (thirty) days.    Marland Kitchen gabapentin (NEURONTIN) 600 MG tablet Take 600 mg by mouth 3 (three) times daily.  3  . levothyroxine (SYNTHROID, LEVOTHROID) 150 MCG tablet Take 150 mcg by mouth daily before breakfast.    . lisinopril (PRINIVIL,ZESTRIL) 5 MG tablet TAKE 1 TABLET BY MOUTH EVERY NIGHT AT BEDTIME 90 tablet 2  . metoprolol tartrate (LOPRESSOR) 25 MG tablet Take 25 mg by mouth daily.     . Multiple Vitamin (MULTIVITAMIN) tablet Take 1 tablet by mouth every other day.     .  nitroGLYCERIN (NITROSTAT) 0.4 MG SL tablet Place 1 tablet (0.4 mg total) under the tongue every 5 (five) minutes as needed. For chest pain 25 tablet 3  . Omega-3 Fatty Acids (FISH OIL) 1200 MG CAPS Take 2 capsules by mouth 2 (two) times daily.     . traMADol (ULTRAM) 50 MG tablet Take 50 mg by mouth every 8 (eight) hours as needed for moderate pain.   1  . isosorbide mononitrate (IMDUR) 30 MG 24 hr tablet Take 1 tablet (30 mg total) by mouth daily. 30 tablet 3   No current facility-administered medications for this visit.    Allergies:   Codeine   Social History:  The patient  reports that he has quit smoking. He does not have any smokeless tobacco history on file. He reports that he drinks alcohol. He reports that he does not use illicit drugs.   Family History:  The patient's  family history includes Cancer (age of onset: 67) in his father; Stroke (age of onset: 76) in his mother.    ROS:  Please see the history of present illness.  Otherwise, review of systems is positive for chest pain, leg swelling, back pain, dizziness, easy bruising, leg pain, joint swelling, and balance problems.  All other systems are reviewed and negative.    PHYSICAL EXAM: VS:  BP 140/78 mmHg  Pulse 64  Ht 6' (1.829 m)  Wt 220 lb (99.791 kg)  BMI 29.83 kg/m2 , BMI Body mass index is 29.83 kg/(m^2). GEN: Well nourished, well developed, in no acute distress HEENT: normal Neck: no JVD, no masses. No carotid bruits Cardiac: RRR with 2/6 SEM at the LUSB, no diastolic murmur or gallop              Respiratory:  clear to auscultation bilaterally, normal work of breathing GI: soft, nontender, nondistended, + BS MS: no deformity or atrophy Ext: no pretibial edema, pedal pulses 2+= bilaterally Skin: warm and dry, no rash Neuro:  Strength and sensation are intact Psych: euthymic mood, full affect  EKG:  EKG is ordered today. The ekg ordered today shows NSR 62 bpm, incomplete RBBB, age-indeterminate inferior  infarct  Recent Labs: No results found for requested labs within last 365 days.   Lipid Panel  No results found for: CHOL, TRIG, HDL, CHOLHDL, VLDL, LDLCALC, LDLDIRECT    Wt Readings from Last 3 Encounters:  03/24/15 220 lb (99.791 kg)  01/24/14 213 lb 1.9 oz (96.671 kg)  12/28/12 226 lb (102.513 kg)     Cardiac Studies Reviewed: Cardiac Cath 09/08/2011: Procedure: Left Heart Cath, Selective Coronary Angiography, LV angiography, PTCA/Stent of the Left Main into the Ramus Intermedius, PerClose of the RFA  Indication: Unstable Angina. 70 year old gentleman with known CAD status post CABG and  multiple PCI procedures. He has episodic chronic angina. However, he has noted progressive symptoms over the last few days with dyspnea on exertion has a past anginal equivalent. Dyspnea and chest tightness have been with minimal activity. He was admitted with unstable angina and referred for cardiac catheterization and possible PCI.  Diagnostic Procedure Details: The right groin was prepped, draped, and anesthetized with 1% lidocaine. Using the modified Seldinger technique, a 5 French sheath was introduced into the right femoral artery. Standard Judkins catheters were used for selective coronary angiography and left ventriculography. LIMA and free RIMA angiography were performed with the JR 4 catheter. Catheter exchanges were performed over a wire. The diagnostic procedure was well-tolerated without immediate complications.  PROCEDURAL FINDINGS Hemodynamics: AO 150/74 LV 153/19  Coronary angiography: Coronary dominance: right  Left mainstem: The left main is moderately calcified. There is diffuse plaque in the proximal and mid shaft of the left main and the vessel then tapers distally into a 70% distal left main stenosis. This is within a previously implanted stent which is difficult to visualize. There is a complex area just beyond the left main trifurcation leading into the ramus  intermedius which has severe 80-90% stenosis, clearly progressive compared to the previous study from 2008.  Left anterior descending (LAD): The LAD is severely diseased. It occludes just after the first diagonal.  Left circumflex (LCx): The AV groove circumflex is patent. There is an intermediate branch which has been heavily stented. There is a long stented segment that has diffuse 50% stenosis. There is an overlap stent extending beyond the easily visible stent that also has diffuse 30-40% stenosis. Distally the vessel bifurcates into twin branches and has 60% stenosis into both branches. This is unchanged from the previous study. The area of severe stenosis occurs at the proximal portion of the intermediate branch and this is in an area of previously overlapped drug-eluting and bare-metal stent. The AV groove circumflex has no significant disease  Right coronary artery (RCA): The RCA is severely diseased until it occludes in its midportion.  LIMA to LAD: The vessel is widely patent. The distal anastomotic site is patent. The mid and distal LAD have diffuse mild to moderate plaque without high-grade focal stenosis.  Free RIMA to distal RCA is patent. There is no obstructive disease throughout the graft. The distal anastomotic site is widely patent. The PDA and posterolateral branches are patent.  Left ventriculography: Left ventricular systolic function is normal, LVEF is estimated at 55-65%, there is no significant mitral regurgitation   PCI Procedure Note: Following the diagnostic procedure, the decision was made to proceed with PCI. The sheath was upsized to a 6 [place]. Weight-based bivalirudin was given for anticoagulation. Once a therapeutic ACT was achieved, a 6 [place] XB LAD 3.5 cm guide catheter was inserted. A cougar coronary guidewire was used to cross the lesion. The lesion was predilated with a 3.0 x 15 mm balloon. The lesion was then stented with a 3.5 x 18 mm Xience Xpedition  drug-eluting stent. Care was taken to treat back into the left main and cover the entire area of overlap into the ramus. The stent was postdilated with a 4.0x15 mm noncompliant balloon to 16 atm. Following PCI, there was 0% residual stenosis and TIMI-3 flow. Final angiography confirmed an excellent result. Femoral hemostasis was achieved with a PerClose device. The patient tolerated the PCI procedure well. There were no immediate procedural complications. The patient was transferred to the post catheterization recovery area for further monitoring.  PCI Data: Vessel - left main/Segment - distal, extending into the ramus intermedius Percent Stenosis (pre) 80 TIMI-flow 3 Stent 3.5 x 18 mm drug-eluting Percent Stenosis (post) 0 TIMI-flow (post) 3  Final Conclusions:   1. Severe native three-vessel coronary artery disease 2. Status post coronary bypass surgery with continued patency of the LIMA to LAD and free RIMA to RCA 3. Preserved LV systolic function 4. Successful PCI of the distal left main into the ramus intermedius  Recommendations: Continued dual antiplatelet therapy with aspirin and Plavix indefinitely  ASSESSMENT AND PLAN: 1.  CAD, native vessel, with exertional angina (CCS class II symptoms). I reviewed the patient's last cardiac catheterization study with him. At that time he had patency of the LIMA and free RIMA grafts. He underwent PCI of the native left main into the stented segment of the ramus intermedius with a 3.5 x 18 mm Xience DES postdilated to 4 mm. He has done well until the last 3-4 weeks. Reviewed treatment options with him today. Will start him on isosorbide 30 mg daily and order an exercise stress Myoview next week for further risk stratification. Consider cardiac catheterization/possible PCI if symptoms persist or if nuclear scan is high risk. He should continue on long-term dual antiplatelet therapy with aspirin and Plavix.  2. Essential hypertension: Blood  pressure is controlled on multidrug therapy with amlodipine, lisinopril, and metoprolol.  3. Hyperlipidemia: The patient is statin intolerant. He is treated with WelChol.  Current medicines are reviewed with the patient today.  The patient does not have concerns regarding medicines.  Labs/ tests ordered today include:   Orders Placed This Encounter  Procedures  . Myocardial Perfusion Imaging  . EKG 12-Lead   Disposition:   FU pending stress test results  Signed, Sherren Mocha, MD  03/24/2015 11:31 AM    [facility] [address], [place], [place]  [address] Phone: [phone]; Fax: [phone]

## 2015-04-10 NOTE — Progress Notes (Signed)
Patient refused use of hospital CPAP for the night. RT informed him to let us know if he changes his mind. RT will monitor as needed.

## 2015-04-10 NOTE — Progress Notes (Signed)
Patient arrived from cath lab, tr band present to l radial, bruising noted both distal and proximal to site.  It was noted soon after arrival small hematoma present proximal to site.  Pressure was applied for approximately 5 minutes and site was bruised but soft.  Pt used urinal, then during assessment it was noted that pt had hematoma proximal to site about 2.5-3 inches in diameter.  Pressure was applied, for approximately 10-12 minutes, cath lab contacted, Donavan Foil came to pt room, pressure was applied above site and tr band was adjusted slightly.  Site was now bruised but soft.  No hematoma formation noted after tr band placement was adjusted.  Pt had oozing from tr band frequently assessed.  Site remained stable with no further hematoma noted.  Due to oozing, deflation of band delayed during shift.  Report to Donna Christen, RN.  Site assessed together, slight oozing, but stable, no further hematoma formation noted.  Bruising noted, area soft, tender upon palpation.

## 2015-04-10 NOTE — Interval H&P Note (Signed)
Cath Lab Visit (complete for each Cath Lab visit)  Clinical Evaluation Leading to the Procedure:   ACS: No.  Non-ACS:    Anginal Classification: CCS II  Anti-ischemic medical therapy: Maximal Therapy (2 or more classes of medications)  Non-Invasive Test Results: Intermediate-risk stress test findings: cardiac mortality 1-3%/year  Prior CABG: Previous CABG      History and Physical Interval Note:  04/10/2015 2:03 PM  [name]  has presented today for surgery, with the diagnosis of cp/cad/abnormal mioview  The various methods of treatment have been discussed with the patient and family. After consideration of risks, benefits and other options for treatment, the patient has consented to  Procedure(s): Left Heart Cath and Cors/Grafts Angiography (N/A) as a surgical intervention .  The patient's history has been reviewed, patient examined, no change in status, stable for surgery.  I have reviewed the patient's chart and labs.  Questions were answered to the patient's satisfaction.     Sherren Mocha

## 2015-04-11 ENCOUNTER — Encounter (HOSPITAL_COMMUNITY): Payer: Self-pay | Admitting: Cardiovascular Disease

## 2015-04-11 DIAGNOSIS — E039 Hypothyroidism, unspecified: Secondary | ICD-10-CM | POA: Diagnosis not present

## 2015-04-11 DIAGNOSIS — Z87891 Personal history of nicotine dependence: Secondary | ICD-10-CM | POA: Diagnosis not present

## 2015-04-11 DIAGNOSIS — Z955 Presence of coronary angioplasty implant and graft: Secondary | ICD-10-CM | POA: Diagnosis not present

## 2015-04-11 DIAGNOSIS — I25700 Atherosclerosis of coronary artery bypass graft(s), unspecified, with unstable angina pectoris: Secondary | ICD-10-CM

## 2015-04-11 DIAGNOSIS — I10 Essential (primary) hypertension: Secondary | ICD-10-CM | POA: Diagnosis not present

## 2015-04-11 DIAGNOSIS — M549 Dorsalgia, unspecified: Secondary | ICD-10-CM | POA: Diagnosis not present

## 2015-04-11 DIAGNOSIS — I208 Other forms of angina pectoris: Secondary | ICD-10-CM | POA: Diagnosis not present

## 2015-04-11 DIAGNOSIS — Z7982 Long term (current) use of aspirin: Secondary | ICD-10-CM | POA: Diagnosis not present

## 2015-04-11 DIAGNOSIS — G8929 Other chronic pain: Secondary | ICD-10-CM | POA: Diagnosis not present

## 2015-04-11 DIAGNOSIS — R931 Abnormal findings on diagnostic imaging of heart and coronary circulation: Secondary | ICD-10-CM | POA: Diagnosis not present

## 2015-04-11 DIAGNOSIS — I350 Nonrheumatic aortic (valve) stenosis: Secondary | ICD-10-CM | POA: Diagnosis not present

## 2015-04-11 DIAGNOSIS — E785 Hyperlipidemia, unspecified: Secondary | ICD-10-CM | POA: Diagnosis not present

## 2015-04-11 DIAGNOSIS — I2582 Chronic total occlusion of coronary artery: Secondary | ICD-10-CM | POA: Diagnosis not present

## 2015-04-11 DIAGNOSIS — Z7902 Long term (current) use of antithrombotics/antiplatelets: Secondary | ICD-10-CM | POA: Diagnosis not present

## 2015-04-11 DIAGNOSIS — I25118 Atherosclerotic heart disease of native coronary artery with other forms of angina pectoris: Secondary | ICD-10-CM | POA: Diagnosis not present

## 2015-04-11 LAB — CBC
HEMATOCRIT: 36.1 % — AB (ref 39.0–52.0)
HEMOGLOBIN: 11.9 g/dL — AB (ref 13.0–17.0)
MCH: 31.9 pg (ref 26.0–34.0)
MCHC: 33 g/dL (ref 30.0–36.0)
MCV: 96.8 fL (ref 78.0–100.0)
Platelets: 163 10*3/uL (ref 150–400)
RBC: 3.73 MIL/uL — AB (ref 4.22–5.81)
RDW: 13.2 % (ref 11.5–15.5)
WBC: 5.3 10*3/uL (ref 4.0–10.5)

## 2015-04-11 LAB — BASIC METABOLIC PANEL
ANION GAP: 8 (ref 5–15)
BUN: 15 mg/dL (ref 6–20)
CHLORIDE: 105 mmol/L (ref 101–111)
CO2: 26 mmol/L (ref 22–32)
Calcium: 9.1 mg/dL (ref 8.9–10.3)
Creatinine, Ser: 1.18 mg/dL (ref 0.61–1.24)
GFR calc Af Amer: >60 mL/min (ref >60)
GFR, EST NON AFRICAN AMERICAN: 60 mL/min — AB (ref >60)
Glucose, Bld: 102 mg/dL — ABNORMAL HIGH (ref 65–99)
POTASSIUM: 4.1 mmol/L (ref 3.5–5.1)
SODIUM: 139 mmol/L (ref 135–145)

## 2015-04-11 MED ORDER — TICAGRELOR 90 MG PO TABS: 90.0000 mg | ORAL_TABLET | Freq: Two times a day (BID) | ORAL | Status: DC

## 2015-04-11 MED ORDER — PANTOPRAZOLE SODIUM 40 MG PO TBEC: 40.0000 mg | DELAYED_RELEASE_TABLET | Freq: Every day | ORAL | Status: DC

## 2015-04-11 MED ORDER — ATORVASTATIN CALCIUM 40 MG PO TABS: 40.0000 mg | ORAL_TABLET | Freq: Every day | ORAL | Status: DC

## 2015-04-11 MED ORDER — ANGIOPLASTY BOOK
Freq: Once | Status: AC
  Administered 2015-04-11: 06:00:00
  Filled 2015-04-11: qty 1

## 2015-04-11 MED FILL — BRILINTA 90 MG TABLET: 90 | 30 days supply | Qty: 60 | Fill #0

## 2015-04-11 NOTE — Progress Notes (Signed)
CARDIAC REHAB PHASE I   PRE:  Rate/Rhythm: 68 SR  BP:  Supine:   Sitting: 119/53  Standing:    SaO2:   MODE:  Ambulation: 1000 ft   POST:  Rate/Rhythm: 76 SR  BP:  Supine:   Sitting: 133/60  Standing:    SaO2:  [id number] Pt walked 1000 ft with steady gait. No CP. Tolerated well. Education completed with pt who voiced understanding. Has had stents before and has attended CRP 2. Agrees to referral to [place] but does not want to do program again. Goes to gym and has routine he does there. Pt has been following heart healthy diet. Discussed importance of brilinta with stent. Pt also takes celebrex so this will need to be discussed with pt by cardiology re continuation or not of celebrex.    Graylon Good, RN BSN  04/11/2015 8:47 AM

## 2015-04-11 NOTE — Progress Notes (Addendum)
   Feels well. No CP, no SOB  Heart RRR Lungs CTAB Cath site intact, ecchymosis noted from prior oozing. Radial  Cath:  Mid RCA lesion, 100% stenosed.  RIMA .  Patent graft - free RIMA  Prox LAD lesion, 100% stenosed.  LIMA .  LIMA-LAD patent  Ost LM to LM lesion, 25% stenosed. The lesion was previously treated with a stent (unknown type).  Ost Ramus to Ramus lesion, 25% stenosed. The lesion was previously treated with a stent (unknown type).  Ost RPDA lesion, 100% stenosed. Post intervention, there is a 0% residual stenosis.  Dist RCA lesion, 99% stenosed. Post intervention, there is a 0% residual stenosis.  1. Severe native vessel CAD with total occlusion of the RCA, total occlusion of the LAD, and patency of the stented segment of the left main into the ramus.  2. Severe stenosis of the distal RCA treated successfully with PCI (drug-eluting stent) 3. Total occlusion of the PDA following distal RCA stenting, likely due to plaque shifting, treated successfully with PCI (DES), but with distal vessel occlusion possibly due to distal vessel dissection (pt asymptomatic)  Continue DAPT. Will transition to [place] tomorrow am. Pt would be a candidate for the [place] Trial if he meets criteria.    Will add atorvastatin 40 QD Recommend that he not take Celebrex, increased bleeding risk. Discussed.  OK for DC.  Candee Furbish, MD

## 2015-04-11 NOTE — Research (Signed)
Spoke to patient regarding participation in [employer] which provides [place]. Pt states he will not stop his Celebrex while taking Brilenta, although Dr. Burt Knack discussed with him that Celebrex would increase his risk of bleeding. Patient very kind, but states I will not stop Celebrex to participate in study.   Dina Rich RN pt's nurse states she will call cardiologist and inform them of patient's decision to continue with Celebrex, therefore he can not participate in study as he would enter at a higher risk to bleed; it is a study that monitors bleeding.

## 2015-04-11 NOTE — Progress Notes (Signed)
TR BAND REMOVAL  LOCATION:  left radial  DEFLATED PER PROTOCOL:  Yes.    TIME BAND OFF / DRESSING APPLIED:   2200   SITE UPON ARRIVAL:   Level 1  SITE AFTER BAND REMOVAL:  Level 1  CIRCULATION SENSATION AND MOVEMENT:  Within Normal Limits  Yes.    COMMENTS:  Delayed removal of band due to bleeding and oozing on previous shift once site stable hemostasis achieved the band deflation began Dr. Burt Knack aware as he was updated at patients bedside on evening rounding. Patient reminded of his restrictions with hand and arm.

## 2015-04-11 NOTE — Discharge Summary (Signed)
Discharge Summary    Patient ID: [name],  MRN: [id number], DOB/AGE: Mar 22, 1945 70 y.o.  Admit date: 04/10/2015 Discharge date: 04/11/2015  Primary Care Provider: Sharilyn Sites CABOT Primary Cardiologist: Dr. Burt Knack  Discharge Diagnoses    Principal Problem:   Abnormal nuclear cardiac imaging test Active Problems:   Exertional angina [facility])   CAD s/p CABG 1997 and PCI to RI 2013   HTN   HL   Hypothyroidism   DJD   Chronic back pain    Allergies Allergies  Allergen Reactions  . Codeine Nausea And Vomiting  . Pollen Extract Other (See Comments)    Seasonal    Diagnostic Studies/Procedures    Cath:  Mid RCA lesion, 100% stenosed.  RIMA .  Patent graft - free RIMA  Prox LAD lesion, 100% stenosed.  LIMA .  LIMA-LAD patent  Ost LM to LM lesion, 25% stenosed. The lesion was previously treated with a stent (unknown type).  Ost Ramus to Ramus lesion, 25% stenosed. The lesion was previously treated with a stent (unknown type).  Ost RPDA lesion, 100% stenosed. Post intervention, there is a 0% residual stenosis.  Dist RCA lesion, 99% stenosed. Post intervention, there is a 0% residual stenosis.  1. Severe native vessel CAD with total occlusion of the RCA, total occlusion of the LAD, and patency of the stented segment of the left main into the ramus.  2. Severe stenosis of the distal RCA treated successfully with PCI (drug-eluting stent) 3. Total occlusion of the PDA following distal RCA stenting, likely due to plaque shifting, treated successfully with PCI (DES), but with distal vessel occlusion possibly due to distal vessel dissection (pt asymptomatic)  Continue DAPT. Will transition to [place] tomorrow am. Pt would be a candidate for the Twilight Trial if he meets criteria.    History of Present Illness     [name] is a 70 y.o. male who presents for Follow-up of coronary artery disease. The patient has a history of two-vessel CABG with  a LIMA to LAD and RIMA to RCA in 1997. He most recently underwent PCI in 2013 with a drug-eluting stent in the ramus intermedius. Coronary bypass grafts were patent at that time and LVEF was normal.  Seen by Dr. Burt Knack 03/24/15 for 3 weeks hx of recurrent chest discomfort. He has taken NTG x 2 and sx's have resolved within a few minutes. When he has gone to the gym and worked out on the elliptical he has chest discomfort with a heart rate of 110 bpm and he has had to 'back off.' He hasn't had any resting angina. There is mild associated dyspnea. He was started on Imdur 30mg  and subsequent underwent stress test which was Intermediate risk nuclear scan with inferolateral ischemia in the patient with known CAD and recurrent angina. Outpatient cath was scheduled.   Hospital Course     Consultants: None  Cath showed severe native vessel CAD with total occlusion of the RCA, total occlusion of the LAD, and patency of the stented segment of the left main into the ramus. Severe stenosis of the distal RCA treated successfully with PCI (drug-eluting stent). Total occlusion of the PDA following distal RCA stenting, likely due to plaque shifting, treated successfully with PCI (DES), but with distal vessel occlusion possibly due to distal vessel dissection (pt asymptomatic). Dr. Burt Knack transitioned Plavix to [facility]. Recommend that he not take Celebrex. Pt states he will not stop his Celebrex while taking Brilenta, although Dr. Burt Knack  discussed with him that Celebrex would increase his risk of bleeding. Patient very kind, but states "I will not stop Celebrex to participate in TWILIGHT study". Will start Protonix to reduce bleeding risk.   The patient has been seen by Dr. Marlou Porch today and deemed ready for discharge home. All follow-up appointments have been scheduled. Discharge medications are listed below.   He understands the risk of bleeding while on Brillinta and ASA. Started Protonix. Will defer further  management per Dr. Burt Knack.   Discharge Vitals Blood pressure 117/68, pulse 57, temperature 98.4 F (36.9 C), temperature source Oral, resp. rate 16, height 6' (1.829 m), weight 220 lb 14.4 oz (100.2 kg), SpO2 97 %.  Filed Weights   04/10/15 0832 04/11/15 0428  Weight: 215 lb (97.523 kg) 220 lb 14.4 oz (100.2 kg)    Labs & Radiologic Studies     CBC  Recent Labs  04/10/15 0900 04/11/15 0500  WBC 4.3 5.3  HGB 12.9* 11.9*  HCT 38.0* 36.1*  MCV 96.2 96.8  PLT 168 [id number]   Basic Metabolic Panel  Recent Labs  04/10/15 0900 04/11/15 0500  NA 140 139  K 4.2 4.1  CL 105 105  CO2 27 26  GLUCOSE 103* 102*  BUN 17 15  CREATININE 1.06 1.18  CALCIUM 9.2 9.1     Disposition   Pt is being discharged home today in good condition.  Follow-up Plans & Appointments    Follow-up Information    Follow up with Almyra Deforest, PA. Go on 05/02/2015.   Specialties:  Cardiology, Radiology   Why:  @10 :00 am for post hospital   Contact information:   [place] [address] [phone]      Discharge Instructions    Amb Referral to Cardiac Rehabilitation    Complete by:  As directed   Diagnosis:  PCI Comment - does not want to attend. has done program before     Diet - low sodium heart healthy    Complete by:  As directed      Discharge instructions    Complete by:  As directed   No driving for 48 hours. No lifting over 5 lbs for 1 week. No sexual activity for 1 week. Keep procedure site clean & dry. If you notice increased pain, swelling, bleeding or pus, call/return!  You may shower, but no soaking baths/hot tubs/pools for 1 week.   Recommend that he not take Celebrex. Will start Protonix to reduce bleeding risk.     Increase activity slowly    Complete by:  As directed            Discharge Medications   Current Discharge Medication List    START taking these medications   Details  atorvastatin (LIPITOR) 40 MG tablet Take 1 tablet (40 mg total) by  mouth daily at 6 PM. Qty: 30 tablet, Refills: 11    pantoprazole (PROTONIX) 40 MG tablet Take 1 tablet (40 mg total) by mouth daily. Qty: 30 tablet, Refills: 6    ticagrelor (BRILINTA) 90 MG TABS tablet Take 1 tablet (90 mg total) by mouth 2 (two) times daily. Qty: 180 tablet, Refills: 3      CONTINUE these medications which have NOT CHANGED   Details  amLODipine (NORVASC) 5 MG tablet TAKE ONE TABLET BY MOUTH EVERY DAY Qty: 90 tablet, Refills: 3    aspirin 81 MG tablet Take 81 mg by mouth at bedtime.     celecoxib (CELEBREX) 200 MG  capsule Take 1 capsule (200 mg total) by mouth daily.   Associated Diagnoses: Coronary artery disease; Aortic stenosis; Other and unspecified hyperlipidemia; Unspecified essential hypertension    Cholecalciferol (VITAMIN D PO) Take 1 tablet by mouth daily.    Coenzyme Q10 (COQ10) 200 MG CAPS Take 200 mg by mouth daily.     colesevelam (WELCHOL) 625 MG tablet Take 625 mg by mouth daily as needed. For cholesterol; "I only take Welchol when I don't stick to my diet"    cyanocobalamin (,VITAMIN B-12,) 1000 MCG/ML injection Inject 1,000 mcg into the muscle every 30 (thirty) days.  Refills: 2    gabapentin (NEURONTIN) 600 MG tablet Take 600 mg by mouth 3 (three) times daily. Refills: 3    glucosamine-chondroitin 500-400 MG tablet Take 1 tablet by mouth daily.    isosorbide mononitrate (IMDUR) 30 MG 24 hr tablet Take 1 tablet (30 mg total) by mouth daily. Qty: 30 tablet, Refills: 3   Associated Diagnoses: Coronary artery disease involving native coronary artery of native heart with other form of angina pectoris (HCC)    levothyroxine (SYNTHROID, LEVOTHROID) 150 MCG tablet Take 150 mcg by mouth daily before breakfast.    lisinopril (PRINIVIL,ZESTRIL) 5 MG tablet TAKE 1 TABLET BY MOUTH EVERY NIGHT AT BEDTIME Qty: 90 tablet, Refills: 2    metoprolol tartrate (LOPRESSOR) 25 MG tablet Take 25 mg by mouth daily.     Multiple Vitamin (MULTIVITAMIN) tablet  Take 1 tablet by mouth every other day.     nitroGLYCERIN (NITROSTAT) 0.4 MG SL tablet Place 1 tablet (0.4 mg total) under the tongue every 5 (five) minutes as needed. For chest pain Qty: 25 tablet, Refills: 3   Associated Diagnoses: Murmur; Essential hypertension; Unstable angina (HCC)    Omega-3 Fatty Acids (FISH OIL) 1200 MG CAPS Take 2 capsules by mouth 2 (two) times daily.     traMADol (ULTRAM) 50 MG tablet Take 50 mg by mouth every 8 (eight) hours as needed for moderate pain.  Refills: 1      STOP taking these medications     clopidogrel (PLAVIX) 75 MG tablet             Outstanding Labs/Studies   none  Duration of Discharge Encounter   Greater than 30 minutes including physician time.  Signed, Bhagat,Bhavinkumar PA-C 04/11/2015, 11:05 AM      Feels well. No CP, no SOB  Heart RRR Lungs CTAB Cath site intact, ecchymosis noted from prior oozing. Radial  2. Severe stenosis of the distal RCA treated successfully with PCI (drug-eluting stent) 3. Total occlusion of the PDA following distal RCA stenting, likely due to plaque shifting, treated successfully with PCI (DES), but with distal vessel occlusion possibly due to distal vessel dissection (pt asymptomatic)  Continue DAPT.   Will add atorvastatin 40 QD Recommend that he not take Celebrex, increased bleeding risk. Discussed.  OK for DC.  Candee Furbish, MD

## 2015-04-18 ENCOUNTER — Telehealth: Payer: Self-pay | Admitting: Cardiovascular Disease

## 2015-04-18 NOTE — Telephone Encounter (Signed)
New message:  Pt is calling in wanting to speak with Lauren about his medications. He voiced that he will be leaving the country today and wanted to be advised before leaving. Please f/u with him

## 2015-04-18 NOTE — Telephone Encounter (Signed)
I spoke with the pt and he wanted to discuss his medications before he leaves for [place] later today.  The pt wanted to know if he needs to continue taking Isosorbide [place].  I advised him that he needs to continue this medication.  The pt said he is also taking Plavix and Brilinta and he will not stop Celebrex. I spoke with Dr Burt Knack and he advised that the pt STOP Brilinta and continue taking ASA and Plavix.  I reviewed this information with the pt and he verbalized understanding. The pt asked what he is suppose to do if he starts bleeding while in [place].  I advised him that he should seek medical attention and not STOP Plavix on his own due to recent DES placement.

## 2015-05-02 ENCOUNTER — Encounter: Payer: Medicare Other | Admitting: Physician Assistant

## 2015-05-08 ENCOUNTER — Ambulatory Visit (INDEPENDENT_AMBULATORY_CARE_PROVIDER_SITE_OTHER): Payer: Medicare Other | Admitting: Physician Assistant

## 2015-05-08 ENCOUNTER — Encounter: Payer: Self-pay | Admitting: Physician Assistant

## 2015-05-08 VITALS — BP 138/70 | HR 60 | Ht 72.0 in | Wt 216.4 lb

## 2015-05-08 DIAGNOSIS — E785 Hyperlipidemia, unspecified: Secondary | ICD-10-CM

## 2015-05-08 DIAGNOSIS — I25810 Atherosclerosis of coronary artery bypass graft(s) without angina pectoris: Secondary | ICD-10-CM

## 2015-05-08 DIAGNOSIS — I10 Essential (primary) hypertension: Secondary | ICD-10-CM | POA: Diagnosis not present

## 2015-05-08 NOTE — Patient Instructions (Signed)
Medication Instructions:  Your physician recommends that you continue on your current medications as directed. Please refer to the Current Medication list given to you today.   Labwork: Your physician recommends that you return for a FASTING lipid profile and lft prior to your 3 month office visit   Testing/Procedures: None ordered  Follow-Up: Your physician recommends that you schedule a follow-up appointment in: 3 months with Dr.Cooper   Any Other Special Instructions Will Be Listed Below (If Applicable).     If you need a refill on your cardiac medications before your next appointment, please call your pharmacy.

## 2015-05-08 NOTE — Progress Notes (Signed)
Cardiology Office Note   Date:  05/08/2015   ID:  Steve, Mack 13-Dec-1945, MRN [id number]  PCP:  Purvis Kilts, MD  Cardiologist:  Dr. Burt Knack  Chief Complaint  Patient presents with  . Hospitalization Follow-up    seen for Dr. Burt Knack, post PCI      History of Present Illness: [name] is a 70 y.o. male who presents for post hospital follow-up. Patient had a history of CAD s/p 2v CABG with LIMA to LAD and RIMA to RCA 1997, hypertension, hyperlipidemia, hypothyroidism and chronic back pain. His last PCI in 2013 with drug-eluting stent to ramus intermedius. He was seen by Dr. Burt Knack in the clinic on 03/24/2015 at which time he complained of 3 weeks onset of recurrent chest discomfort. He was placed on 30 mg Imdur. Outpatient stress test obtained on 04/01/2015 showed EF 55-65%, downsloping ST depression of 1 mm noted during stress in II, III, V4, V5 and V6, medium-sized mild severity reversible defect in basal and mid inferior and inferolateral wall consistent with ischemia. He eventually underwent cardiac catheterization on 04/10/2015 which showed 100% mid RCA lesion, 100% proximal LAD lesion, 100% ostial RPDA lesion, patent LIMA to LAD, patent free RIMA to RCA, 99% severe stenosis in the distal RCA treated with 2 overlapping Resolute Integ 2.5x24mm DES. Patient was seen on 04/11/2015 and was stable enough to be discharged. Unfortunately, he does not want to stop since Celebrex in order to start on Brilinta. Lipitor 40 mg daily was added to his medical regimen. Based on the phone note on 4/7, prior to living glucose to recover, he called our office asking if he should take both Plavix and Brilinta, even though Plavix was stopped during the last admission, Dr. Burt Knack has advised the patient to stop Brilinta and continue on aspirin and Plavix.  He presents today for cardiology follow-up. He denies any significant chest discomfort or dyspnea on exertion. He has been doing very  well since discharge. He actually went to [place] for location and had a lot of physical activity over there without any symptom. He does complain of food occasionally gets stuck in the throat, he denies any facial swelling, or airway swelling. He likely has esophageal stricture. There is no sign of angioedema. He has been taking lisinopril for long time. Pressure is mildly elevated today, however this is only a single value. His last blood pressure in the hospital was 117. I'm less inclined to change any blood pressure medication today. I will arrange 3 months follow-up with Dr. Burt Knack, he was started on increased dose of Lipitor in the hospital and also Protonix. We will obtain a fasting lipid and LFT before his next follow-up.    Past Medical History  Diagnosis Date  . Coronary artery disease     a. s/p CABG x 2 (LIMA->LAD, RIMA->RCA);  b. s/p multiple PCI's to Ramus;  c. 08/2011 Cath/PCI: LM 70% into ramus with 70-80% there->treated wtih 3.5x18 Xience Xpedition DES, LCX  nonobs, RCA occluded.  RIMA & LIMA patent, EF 55-65%  . Hypertension   . Hyperlipidemia   . Hypothyroidism   . Chronic lower back pain   . Anginal pain ([place])   . OSA on CPAP   . DJD (degenerative joint disease)   . Arthritis     "all over"  . History of gout   . Prostate cancer [facility])     Past Surgical History  Procedure Laterality Date  . Knee cartilage surgery  Bilateral   . Robot assisted laparoscopic radical prostatectomy    . Colonoscopy N/A 12/28/2012    Procedure: COLONOSCOPY;  Surgeon: Rogene Houston, MD;  Location: [facility];  Service: Endoscopy;  Laterality: N/A;  830 rescheduled  . Left heart catheterization with coronary angiogram N/A 09/08/2011    Procedure: LEFT HEART CATHETERIZATION WITH CORONARY ANGIOGRAM;  Surgeon: Sherren Mocha, MD;  Location: [facility] CATH LAB;  Service: Cardiovascular;  Laterality: N/A;  . Percutaneous coronary stent intervention (pci-s) Right 09/08/2011    Procedure: PERCUTANEOUS  CORONARY STENT INTERVENTION (PCI-S);  Surgeon: Sherren Mocha, MD;  Location: [facility] CATH LAB;  Service: Cardiovascular;  Laterality: Right;  . Coronary angioplasty with stent placement  2013; 04/10/2015    "this makes me a total of 7" (04/10/2015)  . Hernia repair    . Abdominal hernia repair    . Coronary artery bypass graft  1997    with (LIMA)  . Cardiac catheterization N/A 04/10/2015    Procedure: Left Heart Cath and Cors/Grafts Angiography;  Surgeon: Sherren Mocha, MD;  Location: [place] CV LAB;  Service: Cardiovascular;  Laterality: N/A;  . Cardiac catheterization N/A 04/10/2015    Procedure: Coronary Stent Intervention;  Surgeon: Sherren Mocha, MD;  Location: [place] CV LAB;  Service: Cardiovascular;  Laterality: N/A;     Current Outpatient Prescriptions  Medication Sig Dispense Refill  . amLODipine (NORVASC) 5 MG tablet Take 5 mg by mouth daily.    Marland Kitchen aspirin 81 MG tablet Take 81 mg by mouth at bedtime.     Marland Kitchen atorvastatin (LIPITOR) 40 MG tablet Take 1 tablet (40 mg total) by mouth daily at 6 PM. 30 tablet 11  . celecoxib (CELEBREX) 200 MG capsule Take 1 capsule (200 mg total) by mouth daily.    . Cholecalciferol (VITAMIN D PO) Take 1 tablet by mouth daily.    . clopidogrel (PLAVIX) 75 MG tablet Take 1 tablet (75 mg total) by mouth daily. 30 tablet 11  . Coenzyme Q10 (COQ10) 200 MG CAPS Take 200 mg by mouth daily.     . colesevelam (WELCHOL) 625 MG tablet Take 625 mg by mouth daily as needed. For cholesterol; "I only take Welchol when I don't stick to my diet"    . cyanocobalamin (,VITAMIN B-12,) 1000 MCG/ML injection Inject 1,000 mcg into the muscle every 30 (thirty) days.   2  . gabapentin (NEURONTIN) 600 MG tablet Take 600 mg by mouth 3 (three) times daily.  3  . glucosamine-chondroitin 500-400 MG tablet Take 1 tablet by mouth daily.    . isosorbide mononitrate (IMDUR) 30 MG 24 hr tablet Take 1 tablet (30 mg total) by mouth daily. 30 tablet 3  . levothyroxine (SYNTHROID,  LEVOTHROID) 150 MCG tablet Take 150 mcg by mouth daily before breakfast.    . lisinopril (PRINIVIL,ZESTRIL) 5 MG tablet TAKE 1 TABLET BY MOUTH EVERY NIGHT AT BEDTIME 90 tablet 2  . metoprolol tartrate (LOPRESSOR) 25 MG tablet Take 25 mg by mouth daily.     . Multiple Vitamin (MULTIVITAMIN) tablet Take 1 tablet by mouth every other day.     . nitroGLYCERIN (NITROSTAT) 0.4 MG SL tablet Place 1 tablet (0.4 mg total) under the tongue every 5 (five) minutes as needed. For chest pain 25 tablet 3  . Omega-3 Fatty Acids (FISH OIL) 1200 MG CAPS Take 2 capsules by mouth 2 (two) times daily.     . pantoprazole (PROTONIX) 40 MG tablet Take 1 tablet (40 mg total) by mouth daily. [place]  tablet 6  . traMADol (ULTRAM) 50 MG tablet Take 50 mg by mouth every 8 (eight) hours as needed for moderate pain.   1   No current facility-administered medications for this visit.    Allergies:   Codeine and Pollen extract    Social History:  The patient  reports that he has quit smoking. His smoking use included Cigars. He has never used smokeless tobacco. He reports that he drinks alcohol. He reports that he does not use illicit drugs.   Family History:  The patient's family history includes Cancer (age of onset: 32) in his father; Stroke (age of onset: 108) in his mother.    ROS:  Please see the history of present illness.   Otherwise, review of systems are positive for None.   All other systems are reviewed and negative.    PHYSICAL EXAM: VS:  BP 138/70 mmHg  Pulse 60  Ht 6' (1.829 m)  Wt 216 lb 6.4 oz (98.158 kg)  BMI 29.34 kg/m2 , BMI Body mass index is 29.34 kg/(m^2). GEN: Well nourished, well developed, in no acute distress HEENT: normal Neck: no JVD, carotid bruits, or masses Cardiac: RRR; no murmurs, rubs, or gallops,no edema  Respiratory:  clear to auscultation bilaterally, normal work of breathing GI: soft, nontender, nondistended, + BS MS: no deformity or atrophy Skin: warm and dry, no rash Neuro:   Strength and sensation are intact Psych: euthymic mood, full affect   EKG:  EKG is ordered today. The ekg ordered today demonstrates NSR without significant ST-T wave changes   Recent Labs: 04/11/2015: BUN 15; Creatinine, Ser 1.18; Hemoglobin 11.9*; Platelets 163; Potassium 4.1; Sodium 139    Lipid Panel No results found for: CHOL, TRIG, HDL, CHOLHDL, VLDL, LDLCALC, LDLDIRECT    Wt Readings from Last 3 Encounters:  05/08/15 216 lb 6.4 oz (98.158 kg)  04/11/15 220 lb 14.4 oz (100.2 kg)  03/24/15 220 lb (99.791 kg)      Other studies Reviewed: Additional studies/ records that were reviewed today include:   Echo 04/22/2011 LV EF: 60% -  65%  ------------------------------------------------------------ Indications:   Murmur 785.2.  ------------------------------------------------------------ History:  PMH:  Coronary artery disease. PMH: Hyperlipidemia, hypothyroid, former smoker Risk factors: Hypertension.  ------------------------------------------------------------ Study Conclusions  - Left ventricle: The cavity size was normal. There was mild concentric hypertrophy. Systolic function was normal. The estimated ejection fraction was in the range of 60% to 65%. Wall motion was normal; there were no regional wall motion abnormalities. - Aortic valve: Mildly to moderately calcified annulus. Mildly thickened, mildly calcified leaflets. There was very mild stenosis. Valve area: 2.72cm^2(VTI). Valve area: 3.49cm^2 (Vmax). - Right ventricle: The cavity size was normal. Wall thickness was mildly increased. - Atrial septum: No defect or patent foramen ovale was identified.   Myoview 04/01/2015 Study Highlights     Nuclear stress EF: 59%.  Downsloping ST segment depression ST segment depression of 1 mm was noted during stress in the II, III, V4, V5 and V6 leads, beginning at 6 minutes of stress, ending at 7 minutes of stress.  Defect 1: There  is a medium defect of moderate severity present in the basal inferior, mid inferior and mid inferolateral location.  Findings consistent with ischemia.  This is an intermediate risk study.  The left ventricular ejection fraction is normal (55-65%).  There is a medium size, mild severity reversible defect in the basal and mid inferior and inferolateral walls consistent with ischemia. There were 1 mm ST depressions in the  inferolateral leads that occurs toward the end of exercise and resolve early in the recovery, however recurs 2 minutes in recovery as downsloaping deppressions that persist 7 minutes into recovery (high risk feature).      Cardiac cath 04/10/2015  Conclusion     Mid RCA lesion, 100% stenosed.  RIMA .  Patent graft - free RIMA  Prox LAD lesion, 100% stenosed.  LIMA .  LIMA-LAD patent  Ost LM to LM lesion, 25% stenosed. The lesion was previously treated with a stent (unknown type).  Ost Ramus to Ramus lesion, 25% stenosed. The lesion was previously treated with a stent (unknown type).  Ost RPDA lesion, 100% stenosed. Post intervention, there is a 0% residual stenosis.  Dist RCA lesion, 99% stenosed. Post intervention, there is a 0% residual stenosis.  1. Severe native vessel CAD with total occlusion of the RCA, total occlusion of the LAD, and patency of the stented segment of the left main into the ramus.  2. Severe stenosis of the distal RCA treated successfully with PCI (drug-eluting stent) 3. Total occlusion of the PDA following distal RCA stenting, likely due to plaque shifting, treated successfully with PCI (DES), but with distal vessel occlusion possibly due to distal vessel dissection (pt asymptomatic)  Continue DAPT. Will transition to [place] tomorrow am. Pt would be a candidate for the Twilight Trial if he meets criteria.      Review of the above records demonstrates:   Recently admitted to the hospital after abnormal Myoview, underwent  drug-eluting stent placement in distal RCA and PDA.   ASSESSMENT AND PLAN:  1. CAD s/p CABGx2: See above, underwent cath after abnormal myoview, recent DES 2 to distal RCA and PDA, , we discussed the need to be compliant with DAPT. He has been vacationing in [place] and doing activities without significant chest discomfort. He appears to be doing very well. He is now back on aspirin and Plavix and that no longer on Brilinta.  2. HTN: Blood pressure moderately elevated today, however discharge blood pressure was 117, if blood pressure remains elevated on follow-up, may uptitrate metoprolol or lisinopril.  3. HLD: On Lipitor 40 mg daily. Will arrange fasting lipid panel and LFTs prior to his next follow-up.  4. Hypothyroidism: per PCP  5. Dysphagia: He complained of food stuck in the throat, I am not sure if he has esophageal stricture versus achalasia or esophageal motility issue, he has been taking lisinopril for many years, his symptom does not resemble angioedema. I have instructed him to discuss with his PCP.    Current medicines are reviewed at length with the patient today.  The patient does not have concerns regarding medicines.  The following changes have been made:  no change  Labs/ tests ordered today include:   Orders Placed This Encounter  Procedures  . Lipid panel  . Hepatic function panel  . EKG 12-Lead     Disposition:   FU with Dr. Burt Knack in 3 months  Signed, Almyra Deforest, [place]  05/08/2015 11:55 PM    [address], [place], [place]  [address] Phone: [phone]; Fax: [phone]

## 2015-05-15 ENCOUNTER — Telehealth: Payer: Self-pay | Admitting: Cardiovascular Disease

## 2015-05-15 NOTE — Telephone Encounter (Signed)
Per pt's call he would like the LAB orders fax to [facility]. In [place] fax # [phone]   # [phone]

## 2015-05-15 NOTE — Telephone Encounter (Signed)
Spoke with pt and clarified which labs and he stated it was the fasting labs that [place] ordered at last visit. Pt misplaced prescription that was sent home with him. Called [facility] and verified fax number on automated system. Will send orders over to [facility].

## 2015-05-20 ENCOUNTER — Other Ambulatory Visit: Payer: Self-pay | Admitting: Cardiovascular Disease

## 2015-05-20 DIAGNOSIS — I25810 Atherosclerosis of coronary artery bypass graft(s) without angina pectoris: Secondary | ICD-10-CM | POA: Diagnosis not present

## 2015-05-20 DIAGNOSIS — I10 Essential (primary) hypertension: Secondary | ICD-10-CM | POA: Diagnosis not present

## 2015-05-20 DIAGNOSIS — Z0000 Encounter for general adult medical examination without abnormal findings: Secondary | ICD-10-CM | POA: Diagnosis not present

## 2015-05-20 DIAGNOSIS — E785 Hyperlipidemia, unspecified: Secondary | ICD-10-CM | POA: Diagnosis not present

## 2015-05-20 DIAGNOSIS — Z1389 Encounter for screening for other disorder: Secondary | ICD-10-CM | POA: Diagnosis not present

## 2015-05-20 DIAGNOSIS — E039 Hypothyroidism, unspecified: Secondary | ICD-10-CM | POA: Diagnosis not present

## 2015-05-21 ENCOUNTER — Encounter: Payer: Self-pay | Admitting: Cardiovascular Disease

## 2015-05-21 LAB — HEPATIC FUNCTION PANEL
ALT: 21 IU/L (ref 0–44)
AST: 18 IU/L (ref 0–40)
Albumin: 4.5 g/dL (ref 3.5–4.8)
Alkaline Phosphatase: 58 IU/L (ref 39–117)
BILIRUBIN TOTAL: 0.6 mg/dL (ref 0.0–1.2)
BILIRUBIN, DIRECT: 0.17 mg/dL (ref 0.00–0.40)
TOTAL PROTEIN: 7.1 g/dL (ref 6.0–8.5)

## 2015-05-26 DIAGNOSIS — H2511 Age-related nuclear cataract, right eye: Secondary | ICD-10-CM | POA: Diagnosis not present

## 2015-05-26 DIAGNOSIS — H2513 Age-related nuclear cataract, bilateral: Secondary | ICD-10-CM | POA: Diagnosis not present

## 2015-05-26 DIAGNOSIS — H40011 Open angle with borderline findings, low risk, right eye: Secondary | ICD-10-CM | POA: Diagnosis not present

## 2015-05-26 DIAGNOSIS — H5203 Hypermetropia, bilateral: Secondary | ICD-10-CM | POA: Diagnosis not present

## 2015-06-16 ENCOUNTER — Encounter (HOSPITAL_COMMUNITY): Payer: Self-pay

## 2015-06-17 ENCOUNTER — Encounter (HOSPITAL_COMMUNITY)
Admission: RE | Admit: 2015-06-17 | Discharge: 2015-06-17 | Disposition: A | Discharge status: Home / Self Care | Payer: Medicare Other | Source: Ambulatory Visit | Attending: Ophthalmology | Admitting: Ophthalmology

## 2015-06-17 HISTORY — DX: Gastro-esophageal reflux disease without esophagitis: K21.9

## 2015-06-17 HISTORY — DX: Polyneuropathy, unspecified: G62.9

## 2015-06-17 NOTE — Pre-Procedure Instructions (Signed)
Patient had cardiac cath done in March. Also had recent labs 05/08/2015. He has not had any chest pain or other symptoms since cath. Dr Patsey Berthold aware and ok to use these labs and EKG.

## 2015-06-23 ENCOUNTER — Encounter (HOSPITAL_COMMUNITY)
Admission: RE | Disposition: A | Discharge status: Home / Self Care | Payer: Self-pay | Source: Ambulatory Visit | Attending: Ophthalmology

## 2015-06-23 ENCOUNTER — Ambulatory Visit (HOSPITAL_COMMUNITY)
Admission: RE | Admit: 2015-06-23 | Discharge: 2015-06-23 | Disposition: A | Discharge status: Home / Self Care | Payer: Medicare Other | Source: Ambulatory Visit | Attending: Ophthalmology | Admitting: Ophthalmology

## 2015-06-23 ENCOUNTER — Ambulatory Visit (HOSPITAL_COMMUNITY): Payer: Medicare Other | Admitting: Anesthesiology

## 2015-06-23 ENCOUNTER — Encounter (HOSPITAL_COMMUNITY): Payer: Self-pay | Admitting: *Deleted

## 2015-06-23 DIAGNOSIS — Z7982 Long term (current) use of aspirin: Secondary | ICD-10-CM | POA: Insufficient documentation

## 2015-06-23 DIAGNOSIS — E039 Hypothyroidism, unspecified: Secondary | ICD-10-CM | POA: Diagnosis not present

## 2015-06-23 DIAGNOSIS — I10 Essential (primary) hypertension: Secondary | ICD-10-CM | POA: Diagnosis not present

## 2015-06-23 DIAGNOSIS — Z79899 Other long term (current) drug therapy: Secondary | ICD-10-CM | POA: Insufficient documentation

## 2015-06-23 DIAGNOSIS — H269 Unspecified cataract: Secondary | ICD-10-CM | POA: Diagnosis not present

## 2015-06-23 DIAGNOSIS — I2510 Atherosclerotic heart disease of native coronary artery without angina pectoris: Secondary | ICD-10-CM | POA: Insufficient documentation

## 2015-06-23 DIAGNOSIS — K219 Gastro-esophageal reflux disease without esophagitis: Secondary | ICD-10-CM | POA: Insufficient documentation

## 2015-06-23 DIAGNOSIS — G4730 Sleep apnea, unspecified: Secondary | ICD-10-CM | POA: Insufficient documentation

## 2015-06-23 DIAGNOSIS — H2511 Age-related nuclear cataract, right eye: Secondary | ICD-10-CM | POA: Diagnosis not present

## 2015-06-23 DIAGNOSIS — Z951 Presence of aortocoronary bypass graft: Secondary | ICD-10-CM | POA: Diagnosis not present

## 2015-06-23 HISTORY — PX: CATARACT EXTRACTION W/PHACO: SHX586

## 2015-06-23 SURGERY — PHACOEMULSIFICATION, CATARACT, WITH IOL INSERTION
Anesthesia: Monitor Anesthesia Care | Site: Eye | Laterality: Right

## 2015-06-23 MED ORDER — POVIDONE-IODINE 5 % OP SOLN
OPHTHALMIC | Status: DC | PRN
  Administered 2015-06-23: 1 via OPHTHALMIC

## 2015-06-23 MED ORDER — PROVISC 10 MG/ML IO SOLN
INTRAOCULAR | Status: DC | PRN
  Administered 2015-06-23: 0.85 mL via INTRAOCULAR

## 2015-06-23 MED ORDER — BSS IO SOLN
INTRAOCULAR | Status: DC | PRN
  Administered 2015-06-23: 15 mL via INTRAOCULAR

## 2015-06-23 MED ORDER — PHENYLEPHRINE HCL 2.5 % OP SOLN
1.0000 [drp] | OPHTHALMIC | Status: AC
  Administered 2015-06-23 (×3): 1 [drp] via OPHTHALMIC

## 2015-06-23 MED ORDER — FENTANYL CITRATE (PF) 100 MCG/2ML IJ SOLN
25.0000 ug | INTRAMUSCULAR | Status: AC | PRN
  Administered 2015-06-23 (×2): 25 ug via INTRAVENOUS

## 2015-06-23 MED ORDER — LIDOCAINE HCL (PF) 1 % IJ SOLN
INTRAMUSCULAR | Status: DC | PRN
  Administered 2015-06-23: .5 mL

## 2015-06-23 MED ORDER — FENTANYL CITRATE (PF) 100 MCG/2ML IJ SOLN
INTRAMUSCULAR | Status: AC
  Filled 2015-06-23: qty 2

## 2015-06-23 MED ORDER — BSS IO SOLN
INTRAOCULAR | Status: DC | PRN
  Administered 2015-06-23: 500 mL

## 2015-06-23 MED ORDER — CYCLOPENTOLATE-PHENYLEPHRINE 0.2-1 % OP SOLN
1.0000 [drp] | OPHTHALMIC | Status: AC
  Administered 2015-06-23 (×3): 1 [drp] via OPHTHALMIC

## 2015-06-23 MED ORDER — LACTATED RINGERS IV SOLN
INTRAVENOUS | Status: DC
  Administered 2015-06-23: 08:00:00 via INTRAVENOUS

## 2015-06-23 MED ORDER — TETRACAINE HCL 0.5 % OP SOLN
1.0000 [drp] | OPHTHALMIC | Status: AC
  Administered 2015-06-23 (×3): 1 [drp] via OPHTHALMIC

## 2015-06-23 MED ORDER — MIDAZOLAM HCL 2 MG/2ML IJ SOLN
1.0000 mg | INTRAMUSCULAR | Status: DC | PRN
Start: 2015-06-23 — End: 2015-06-23
  Administered 2015-06-23: 2 mg via INTRAVENOUS

## 2015-06-23 MED ORDER — LIDOCAINE 3.5 % OP GEL OPTIME - NO CHARGE
OPHTHALMIC | Status: DC | PRN
  Administered 2015-06-23: 1 [drp] via OPHTHALMIC

## 2015-06-23 MED ORDER — LIDOCAINE HCL 3.5 % OP GEL
1.0000 "application " | Freq: Once | OPHTHALMIC | Status: AC
  Administered 2015-06-23: 1 via OPHTHALMIC

## 2015-06-23 MED ORDER — EPINEPHRINE HCL 1 MG/ML IJ SOLN
INTRAMUSCULAR | Status: AC
  Filled 2015-06-23: qty 1

## 2015-06-23 MED ORDER — MIDAZOLAM HCL 2 MG/2ML IJ SOLN
INTRAMUSCULAR | Status: AC
  Filled 2015-06-23: qty 2

## 2015-06-23 MED ORDER — NEOMYCIN-POLYMYXIN-DEXAMETH 3.5-10000-0.1 OP SUSP
OPHTHALMIC | Status: DC | PRN
  Administered 2015-06-23: 1 [drp] via OPHTHALMIC

## 2015-06-23 SURGICAL SUPPLY — 25 items
CAPSULAR TENSION RING-AMO (OPHTHALMIC RELATED) IMPLANT
CLOTH BEACON ORANGE TIMEOUT ST (SAFETY) ×3 IMPLANT
EYE SHIELD UNIVERSAL CLEAR (GAUZE/BANDAGES/DRESSINGS) ×3 IMPLANT
GLOVE BIOGEL PI IND STRL 6.5 (GLOVE) ×1 IMPLANT
GLOVE BIOGEL PI IND STRL 7.0 (GLOVE) ×1 IMPLANT
GLOVE BIOGEL PI IND STRL 7.5 (GLOVE) IMPLANT
GLOVE BIOGEL PI INDICATOR 6.5 (GLOVE) ×2
GLOVE BIOGEL PI INDICATOR 7.0 (GLOVE) ×2
GLOVE BIOGEL PI INDICATOR 7.5 (GLOVE)
GLOVE EXAM NITRILE LRG STRL (GLOVE) IMPLANT
GLOVE EXAM NITRILE MD LF STRL (GLOVE) IMPLANT
KIT VITRECTOMY (OPHTHALMIC RELATED) IMPLANT
PAD ARMBOARD 7.5X6 YLW CONV (MISCELLANEOUS) ×3 IMPLANT
PROC W NO LENS (INTRAOCULAR LENS)
PROC W SPEC LENS (INTRAOCULAR LENS)
PROCESS W NO LENS (INTRAOCULAR LENS) IMPLANT
PROCESS W SPEC LENS (INTRAOCULAR LENS) IMPLANT
RETRACTOR IRIS SIGHTPATH (OPHTHALMIC RELATED) IMPLANT
RING MALYGIN (MISCELLANEOUS) IMPLANT
SIGHTPATH CAT PROC W REG LENS (Ophthalmic Related) ×3 IMPLANT
SYRINGE LUER LOK 1CC (MISCELLANEOUS) ×3 IMPLANT
TAPE SURG TRANSPORE 1 IN (GAUZE/BANDAGES/DRESSINGS) ×1 IMPLANT
TAPE SURGICAL TRANSPORE 1 IN (GAUZE/BANDAGES/DRESSINGS) ×2
VISCOELASTIC ADDITIONAL (OPHTHALMIC RELATED) IMPLANT
WATER STERILE IRR 250ML POUR (IV SOLUTION) ×3 IMPLANT

## 2015-06-23 NOTE — Anesthesia Postprocedure Evaluation (Signed)
Anesthesia Post Note  Patient: [name] [facility]  Procedure(s) Performed: Procedure(s) (LRB): CATARACT EXTRACTION PHACO AND INTRAOCULAR LENS PLACEMENT RIGHT EYE; CDE:  7.08 (Right)  Patient location during evaluation: Short Stay Anesthesia Type: MAC Level of consciousness: awake and alert Pain management: pain level controlled Vital Signs Assessment: post-procedure vital signs reviewed and stable Respiratory status: spontaneous breathing Cardiovascular status: blood pressure returned to baseline Postop Assessment: no signs of nausea or vomiting Anesthetic complications: no    Last Vitals:  Filed Vitals:   06/23/15 0845 06/23/15 0848  BP:    Pulse:    Temp:    Resp: 13 18    Last Pain: There were no vitals filed for this visit.               Karn Derk

## 2015-06-23 NOTE — H&P (Signed)
I have reviewed the H&P, the patient was re-examined, and I have identified no interval changes in medical condition and plan of care since the history and physical of record  

## 2015-06-23 NOTE — Transfer of Care (Signed)
Immediate Anesthesia Transfer of Care Note  Patient: [name] [facility]  Procedure(s) Performed: Procedure(s): CATARACT EXTRACTION PHACO AND INTRAOCULAR LENS PLACEMENT RIGHT EYE; CDE:  7.08 (Right)  Patient Location: Short Stay  Anesthesia Type:MAC  Level of Consciousness: awake  Airway & Oxygen Therapy: Patient Spontanous Breathing  Post-op Assessment: Report given to RN  Post vital signs: Reviewed  Last Vitals:  Filed Vitals:   06/23/15 0845 06/23/15 0848  BP:    Pulse:    Temp:    Resp: 13 18    Last Pain: There were no vitals filed for this visit.    Patients Stated Pain Goal: 4 ([id number])  Complications: No apparent anesthesia complications

## 2015-06-23 NOTE — Anesthesia Preprocedure Evaluation (Signed)
Anesthesia Evaluation  Patient identified by MRN, date of birth, ID band Patient awake    Reviewed: Allergy & Precautions, NPO status , Patient's Chart, lab work & pertinent test results  Airway Mallampati: III  TM Distance: >3 FB     Dental  (+) Teeth Intact, Implants   Pulmonary sleep apnea and Continuous Positive Airway Pressure Ventilation , former smoker,    breath sounds clear to auscultation       Cardiovascular hypertension, + angina with exertion + CAD, + Cardiac Stents and + CABG   Rhythm:Regular Rate:Normal     Neuro/Psych    GI/Hepatic GERD  ,  Endo/Other  Hypothyroidism   Renal/GU      Musculoskeletal   Abdominal   Peds  Hematology   Anesthesia Other Findings   Reproductive/Obstetrics                             Anesthesia Physical Anesthesia Plan  ASA: III  Anesthesia Plan: MAC   Post-op Pain Management:    Induction: Intravenous  Airway Management Planned: Nasal Cannula  Additional Equipment:   Intra-op Plan:   Post-operative Plan:   Informed Consent: I have reviewed the patients History and Physical, chart, labs and discussed the procedure including the risks, benefits and alternatives for the proposed anesthesia with the patient or authorized representative who has indicated his/her understanding and acceptance.     Plan Discussed with:   Anesthesia Plan Comments:         Anesthesia Quick Evaluation

## 2015-06-23 NOTE — Op Note (Signed)
Date of Admission: 06/23/2015  Date of Surgery: 06/23/2015   Pre-Op Dx: Cataract Right Eye  Post-Op Dx: Senile Nuclear Cataract Right  Eye,  Dx Code H25.11  Surgeon: Tonny Branch, M.D.  Assistants: None  Anesthesia: Topical with MAC  Indications: Painless, progressive loss of vision with compromise of daily activities.  Surgery: Cataract Extraction with Intraocular lens Implant Right Eye  Discription: The patient had dilating drops and viscous lidocaine placed into the Right eye in the pre-op holding area. After transfer to the operating room, a time out was performed. The patient was then prepped and draped. Beginning with a 62 degree blade a paracentesis port was made at the surgeon's 2 o'clock position. The anterior chamber was then filled with 1% non-preserved lidocaine. This was followed by filling the anterior chamber with Provisc.  A 2.39mm keratome blade was used to make a clear corneal incision at the temporal limbus.  A bent cystatome needle was used to create a continuous tear capsulotomy. Hydrodissection was performed with balanced salt solution on a Fine canula. The lens nucleus was then removed using the phacoemulsification handpiece. Residual cortex was removed with the I&A handpiece. The anterior chamber and capsular bag were refilled with Provisc. A posterior chamber intraocular lens was placed into the capsular bag with it's injector. The implant was positioned with the Kuglan hook. The Provisc was then removed from the anterior chamber and capsular bag with the I&A handpiece. Stromal hydration of the main incision and paracentesis port was performed with BSS on a Fine canula. The wounds were tested for leak which was negative. The patient tolerated the procedure well. There were no operative complications. The patient was then transferred to the recovery room in stable condition.  Complications: None  Specimen: None  EBL: None  Prosthetic device: Hoya iSert 250, power 21.5 D,  SN [id number].

## 2015-06-23 NOTE — Discharge Instructions (Signed)

## 2015-06-24 ENCOUNTER — Encounter (HOSPITAL_COMMUNITY): Payer: Self-pay | Admitting: Ophthalmology

## 2015-07-13 ENCOUNTER — Other Ambulatory Visit: Payer: Self-pay | Admitting: Cardiovascular Disease

## 2015-07-23 ENCOUNTER — Encounter: Payer: Self-pay | Admitting: Cardiovascular Disease

## 2015-08-04 DIAGNOSIS — H2512 Age-related nuclear cataract, left eye: Secondary | ICD-10-CM | POA: Diagnosis not present

## 2015-08-13 ENCOUNTER — Ambulatory Visit: Payer: Medicare Other | Admitting: Cardiovascular Disease

## 2015-08-18 DIAGNOSIS — L570 Actinic keratosis: Secondary | ICD-10-CM | POA: Diagnosis not present

## 2015-08-18 DIAGNOSIS — X32XXXD Exposure to sunlight, subsequent encounter: Secondary | ICD-10-CM | POA: Diagnosis not present

## 2015-08-18 DIAGNOSIS — Z1283 Encounter for screening for malignant neoplasm of skin: Secondary | ICD-10-CM | POA: Diagnosis not present

## 2015-08-18 DIAGNOSIS — D225 Melanocytic nevi of trunk: Secondary | ICD-10-CM | POA: Diagnosis not present

## 2015-08-19 MED ORDER — ONDANSETRON HCL 4 MG/2ML IJ SOLN: 4.0000 mg | Freq: Once | INTRAMUSCULAR | Status: DC | PRN

## 2015-08-19 MED ORDER — FENTANYL CITRATE (PF) 100 MCG/2ML IJ SOLN: 25.0000 ug | INTRAMUSCULAR | Status: DC | PRN

## 2015-08-20 ENCOUNTER — Encounter (HOSPITAL_COMMUNITY)
Admission: RE | Admit: 2015-08-20 | Discharge: 2015-08-20 | Disposition: A | Discharge status: Home / Self Care | Payer: Medicare Other | Source: Ambulatory Visit | Attending: Ophthalmology | Admitting: Ophthalmology

## 2015-08-20 ENCOUNTER — Encounter (HOSPITAL_COMMUNITY): Payer: Self-pay

## 2015-08-25 ENCOUNTER — Ambulatory Visit (HOSPITAL_COMMUNITY): Payer: Medicare Other | Admitting: Anesthesiology

## 2015-08-25 ENCOUNTER — Ambulatory Visit (HOSPITAL_COMMUNITY)
Admission: RE | Admit: 2015-08-25 | Discharge: 2015-08-25 | Disposition: A | Discharge status: Home / Self Care | Payer: Medicare Other | Source: Ambulatory Visit | Attending: Ophthalmology | Admitting: Ophthalmology

## 2015-08-25 ENCOUNTER — Encounter (HOSPITAL_COMMUNITY): Payer: Self-pay | Admitting: *Deleted

## 2015-08-25 ENCOUNTER — Encounter (HOSPITAL_COMMUNITY)
Admission: RE | Disposition: A | Discharge status: Home / Self Care | Payer: Self-pay | Source: Ambulatory Visit | Attending: Ophthalmology

## 2015-08-25 DIAGNOSIS — E039 Hypothyroidism, unspecified: Secondary | ICD-10-CM | POA: Diagnosis not present

## 2015-08-25 DIAGNOSIS — I2510 Atherosclerotic heart disease of native coronary artery without angina pectoris: Secondary | ICD-10-CM | POA: Insufficient documentation

## 2015-08-25 DIAGNOSIS — Z7902 Long term (current) use of antithrombotics/antiplatelets: Secondary | ICD-10-CM | POA: Insufficient documentation

## 2015-08-25 DIAGNOSIS — I10 Essential (primary) hypertension: Secondary | ICD-10-CM | POA: Insufficient documentation

## 2015-08-25 DIAGNOSIS — H2512 Age-related nuclear cataract, left eye: Secondary | ICD-10-CM | POA: Insufficient documentation

## 2015-08-25 DIAGNOSIS — Z951 Presence of aortocoronary bypass graft: Secondary | ICD-10-CM | POA: Insufficient documentation

## 2015-08-25 DIAGNOSIS — Z79899 Other long term (current) drug therapy: Secondary | ICD-10-CM | POA: Diagnosis not present

## 2015-08-25 DIAGNOSIS — H269 Unspecified cataract: Secondary | ICD-10-CM | POA: Diagnosis not present

## 2015-08-25 DIAGNOSIS — K219 Gastro-esophageal reflux disease without esophagitis: Secondary | ICD-10-CM | POA: Diagnosis not present

## 2015-08-25 DIAGNOSIS — Z955 Presence of coronary angioplasty implant and graft: Secondary | ICD-10-CM | POA: Diagnosis not present

## 2015-08-25 DIAGNOSIS — Z7982 Long term (current) use of aspirin: Secondary | ICD-10-CM | POA: Insufficient documentation

## 2015-08-25 DIAGNOSIS — G4730 Sleep apnea, unspecified: Secondary | ICD-10-CM | POA: Diagnosis not present

## 2015-08-25 DIAGNOSIS — Z87891 Personal history of nicotine dependence: Secondary | ICD-10-CM | POA: Insufficient documentation

## 2015-08-25 HISTORY — PX: CATARACT EXTRACTION W/PHACO: SHX586

## 2015-08-25 SURGERY — PHACOEMULSIFICATION, CATARACT, WITH IOL INSERTION
Anesthesia: Monitor Anesthesia Care | Site: Eye | Laterality: Left

## 2015-08-25 MED ORDER — EPINEPHRINE HCL 1 MG/ML IJ SOLN
INTRAOCULAR | Status: DC | PRN
  Administered 2015-08-25: 500 mL

## 2015-08-25 MED ORDER — FENTANYL CITRATE (PF) 100 MCG/2ML IJ SOLN
25.0000 ug | INTRAMUSCULAR | Status: AC | PRN
  Administered 2015-08-25 (×2): 25 ug via INTRAVENOUS

## 2015-08-25 MED ORDER — PROVISC 10 MG/ML IO SOLN
INTRAOCULAR | Status: DC | PRN
  Administered 2015-08-25: 0.85 mL via INTRAOCULAR

## 2015-08-25 MED ORDER — LACTATED RINGERS IV SOLN
INTRAVENOUS | Status: DC
  Administered 2015-08-25: 08:00:00 via INTRAVENOUS

## 2015-08-25 MED ORDER — MIDAZOLAM HCL 2 MG/2ML IJ SOLN
INTRAMUSCULAR | Status: AC
  Filled 2015-08-25: qty 2

## 2015-08-25 MED ORDER — FENTANYL CITRATE (PF) 100 MCG/2ML IJ SOLN
INTRAMUSCULAR | Status: AC
  Filled 2015-08-25: qty 2

## 2015-08-25 MED ORDER — NEOMYCIN-POLYMYXIN-DEXAMETH 3.5-10000-0.1 OP SUSP
OPHTHALMIC | Status: DC | PRN
  Administered 2015-08-25: 2 [drp] via OPHTHALMIC

## 2015-08-25 MED ORDER — TETRACAINE HCL 0.5 % OP SOLN
1.0000 [drp] | OPHTHALMIC | Status: AC
  Administered 2015-08-25 (×3): 1 [drp] via OPHTHALMIC

## 2015-08-25 MED ORDER — MIDAZOLAM HCL 2 MG/2ML IJ SOLN
1.0000 mg | INTRAMUSCULAR | Status: DC | PRN
  Administered 2015-08-25: 2 mg via INTRAVENOUS

## 2015-08-25 MED ORDER — LIDOCAINE HCL 3.5 % OP GEL
1.0000 "application " | Freq: Once | OPHTHALMIC | Status: AC
  Administered 2015-08-25: 1 via OPHTHALMIC

## 2015-08-25 MED ORDER — BSS IO SOLN
INTRAOCULAR | Status: DC | PRN
  Administered 2015-08-25: 15 mL via INTRAOCULAR

## 2015-08-25 MED ORDER — CYCLOPENTOLATE-PHENYLEPHRINE 0.2-1 % OP SOLN
1.0000 [drp] | OPHTHALMIC | Status: AC
  Administered 2015-08-25 (×3): 1 [drp] via OPHTHALMIC

## 2015-08-25 MED ORDER — LIDOCAINE HCL (PF) 1 % IJ SOLN
INTRAMUSCULAR | Status: DC | PRN
  Administered 2015-08-25: .4 mL

## 2015-08-25 MED ORDER — POVIDONE-IODINE 5 % OP SOLN
OPHTHALMIC | Status: DC | PRN
  Administered 2015-08-25: 1 via OPHTHALMIC

## 2015-08-25 MED ORDER — EPINEPHRINE HCL 1 MG/ML IJ SOLN
INTRAMUSCULAR | Status: AC
  Filled 2015-08-25: qty 1

## 2015-08-25 MED ORDER — LIDOCAINE 3.5 % OP GEL OPTIME - NO CHARGE
OPHTHALMIC | Status: DC | PRN
  Administered 2015-08-25: 2 [drp] via OPHTHALMIC

## 2015-08-25 MED ORDER — PHENYLEPHRINE HCL 2.5 % OP SOLN
1.0000 [drp] | OPHTHALMIC | Status: AC
  Administered 2015-08-25 (×3): 1 [drp] via OPHTHALMIC

## 2015-08-25 SURGICAL SUPPLY — 23 items
CAPSULAR TENSION RING-AMO (OPHTHALMIC RELATED) IMPLANT
CLOTH BEACON ORANGE TIMEOUT ST (SAFETY) ×3 IMPLANT
EYE SHIELD UNIVERSAL CLEAR (GAUZE/BANDAGES/DRESSINGS) ×3 IMPLANT
GLOVE BIOGEL PI IND STRL 7.0 (GLOVE) ×2 IMPLANT
GLOVE BIOGEL PI IND STRL 7.5 (GLOVE) IMPLANT
GLOVE BIOGEL PI INDICATOR 7.0 (GLOVE) ×4
GLOVE BIOGEL PI INDICATOR 7.5 (GLOVE)
GLOVE EXAM NITRILE LRG STRL (GLOVE) IMPLANT
GLOVE EXAM NITRILE MD LF STRL (GLOVE) IMPLANT
KIT VITRECTOMY (OPHTHALMIC RELATED) IMPLANT
PAD ARMBOARD 7.5X6 YLW CONV (MISCELLANEOUS) ×3 IMPLANT
PROC W NO LENS (INTRAOCULAR LENS)
PROC W SPEC LENS (INTRAOCULAR LENS)
PROCESS W NO LENS (INTRAOCULAR LENS) IMPLANT
PROCESS W SPEC LENS (INTRAOCULAR LENS) IMPLANT
RETRACTOR IRIS SIGHTPATH (OPHTHALMIC RELATED) IMPLANT
RING MALYGIN (MISCELLANEOUS) IMPLANT
SIGHTPATH CAT PROC W REG LENS (Ophthalmic Related) ×3 IMPLANT
SYRINGE LUER LOK 1CC (MISCELLANEOUS) ×3 IMPLANT
TAPE SURG TRANSPORE 1 IN (GAUZE/BANDAGES/DRESSINGS) ×1 IMPLANT
TAPE SURGICAL TRANSPORE 1 IN (GAUZE/BANDAGES/DRESSINGS) ×2
VISCOELASTIC ADDITIONAL (OPHTHALMIC RELATED) IMPLANT
WATER STERILE IRR 250ML POUR (IV SOLUTION) ×3 IMPLANT

## 2015-08-25 NOTE — Transfer of Care (Signed)
Immediate Anesthesia Transfer of Care Note  Patient: [name] [facility]  Procedure(s) Performed: Procedure(s): CATARACT EXTRACTION PHACO AND INTRAOCULAR LENS PLACEMENT LEFT EYE; CDE:  8.18 (Left)  Patient Location: Short Stay  Anesthesia Type:MAC  Level of Consciousness: awake  Airway & Oxygen Therapy: Patient Spontanous Breathing  Post-op Assessment: Report given to RN  Post vital signs: Reviewed  Last Vitals:  Vitals:   08/25/15 0830 08/25/15 0835  BP: 118/69 (!) 109/59  Resp: 14 13  Temp:      Last Pain:  Vitals:   08/25/15 0810  TempSrc: Oral      Patients Stated Pain Goal: 8 ([id number])  Complications: No apparent anesthesia complications

## 2015-08-25 NOTE — Anesthesia Postprocedure Evaluation (Signed)
Anesthesia Post Note  Patient: [name] [name]  Procedure(s) Performed: Procedure(s) (LRB): CATARACT EXTRACTION PHACO AND INTRAOCULAR LENS PLACEMENT LEFT EYE; CDE:  8.18 (Left)  Patient location during evaluation: Short Stay Anesthesia Type: MAC Level of consciousness: awake and alert Pain management: pain level controlled Vital Signs Assessment: post-procedure vital signs reviewed and stable Respiratory status: spontaneous breathing Cardiovascular status: stable Postop Assessment: no signs of nausea or vomiting Anesthetic complications: no    Last Vitals:  Vitals:   08/25/15 0830 08/25/15 0835  BP: 118/69 (!) 109/59  Resp: 14 13  Temp:      Last Pain:  Vitals:   08/25/15 0810  TempSrc: Oral                 [name]

## 2015-08-25 NOTE — Anesthesia Preprocedure Evaluation (Signed)
Anesthesia Evaluation  Patient identified by MRN, date of birth, ID band Patient awake    Reviewed: Allergy & Precautions, NPO status , Patient's Chart, lab work & pertinent test results  Airway Mallampati: III  TM Distance: >3 FB     Dental  (+) Teeth Intact, Implants   Pulmonary sleep apnea and Continuous Positive Airway Pressure Ventilation , former smoker,    breath sounds clear to auscultation       Cardiovascular hypertension, + angina with exertion + CAD, + Cardiac Stents and + CABG   Rhythm:Regular Rate:Normal     Neuro/Psych    GI/Hepatic GERD  ,  Endo/Other  Hypothyroidism   Renal/GU      Musculoskeletal   Abdominal   Peds  Hematology   Anesthesia Other Findings   Reproductive/Obstetrics                             Anesthesia Physical Anesthesia Plan  ASA: III  Anesthesia Plan: MAC   Post-op Pain Management:    Induction: Intravenous  Airway Management Planned: Nasal Cannula  Additional Equipment:   Intra-op Plan:   Post-operative Plan:   Informed Consent: I have reviewed the patients History and Physical, chart, labs and discussed the procedure including the risks, benefits and alternatives for the proposed anesthesia with the patient or authorized representative who has indicated his/her understanding and acceptance.     Plan Discussed with:   Anesthesia Plan Comments:         Anesthesia Quick Evaluation

## 2015-08-25 NOTE — Op Note (Signed)
Date of Admission: 08/25/2015  Date of Surgery: 08/25/2015   Pre-Op Dx: Cataract Left Eye  Post-Op Dx: Senile Nuclear Cataract Left  Eye,  Dx Code H25.12  Surgeon: Tonny Branch, M.D.  Assistants: None  Anesthesia: Topical with MAC  Indications: Painless, progressive loss of vision with compromise of daily activities.  Surgery: Cataract Extraction with Intraocular lens Implant Left Eye  Discription: The patient had dilating drops and viscous lidocaine placed into the Left eye in the pre-op holding area. After transfer to the operating room, a time out was performed. The patient was then prepped and draped. Beginning with a 82 degree blade a paracentesis port was made at the surgeon's 2 o'clock position. The anterior chamber was then filled with 1% non-preserved lidocaine. This was followed by filling the anterior chamber with Provisc.  A 2.58mm keratome blade was used to make a clear corneal incision at the temporal limbus.  A bent cystatome needle was used to create a continuous tear capsulotomy. Hydrodissection was performed with balanced salt solution on a Fine canula. The lens nucleus was then removed using the phacoemulsification handpiece. Residual cortex was removed with the I&A handpiece. The anterior chamber and capsular bag were refilled with Provisc. A posterior chamber intraocular lens was placed into the capsular bag with it's injector. The implant was positioned with the Kuglan hook. The Provisc was then removed from the anterior chamber and capsular bag with the I&A handpiece. Stromal hydration of the main incision and paracentesis port was performed with BSS on a Fine canula. The wounds were tested for leak which was negative. The patient tolerated the procedure well. There were no operative complications. The patient was then transferred to the recovery room in stable condition.  Complications: None  Specimen: None  EBL: None  Prosthetic device: Hoya iSert 250, power 21.5 D, SN  [id number].

## 2015-08-25 NOTE — H&P (Signed)
I have reviewed the H&P, the patient was re-examined, and I have identified no interval changes in medical condition and plan of care since the history and physical of record  

## 2015-08-25 NOTE — Discharge Instructions (Signed)

## 2015-08-28 ENCOUNTER — Encounter (HOSPITAL_COMMUNITY): Payer: Self-pay | Admitting: Ophthalmology

## 2015-09-03 DIAGNOSIS — K219 Gastro-esophageal reflux disease without esophagitis: Secondary | ICD-10-CM | POA: Diagnosis not present

## 2015-09-03 DIAGNOSIS — Z1389 Encounter for screening for other disorder: Secondary | ICD-10-CM | POA: Diagnosis not present

## 2015-09-03 DIAGNOSIS — E663 Overweight: Secondary | ICD-10-CM | POA: Diagnosis not present

## 2015-09-03 DIAGNOSIS — G4733 Obstructive sleep apnea (adult) (pediatric): Secondary | ICD-10-CM | POA: Diagnosis not present

## 2015-09-03 DIAGNOSIS — R1312 Dysphagia, oropharyngeal phase: Secondary | ICD-10-CM | POA: Diagnosis not present

## 2015-09-05 ENCOUNTER — Encounter (INDEPENDENT_AMBULATORY_CARE_PROVIDER_SITE_OTHER): Payer: Self-pay | Admitting: Internal Medicine

## 2015-09-18 ENCOUNTER — Other Ambulatory Visit: Payer: Self-pay | Admitting: Cardiovascular Disease

## 2015-09-18 DIAGNOSIS — G4733 Obstructive sleep apnea (adult) (pediatric): Secondary | ICD-10-CM | POA: Diagnosis not present

## 2015-09-22 ENCOUNTER — Encounter (INDEPENDENT_AMBULATORY_CARE_PROVIDER_SITE_OTHER): Payer: Self-pay | Admitting: Internal Medicine

## 2015-09-22 ENCOUNTER — Encounter (INDEPENDENT_AMBULATORY_CARE_PROVIDER_SITE_OTHER): Payer: Self-pay | Admitting: *Deleted

## 2015-09-22 ENCOUNTER — Other Ambulatory Visit (INDEPENDENT_AMBULATORY_CARE_PROVIDER_SITE_OTHER): Payer: Self-pay | Admitting: Internal Medicine

## 2015-09-22 ENCOUNTER — Ambulatory Visit (INDEPENDENT_AMBULATORY_CARE_PROVIDER_SITE_OTHER): Payer: Medicare Other | Admitting: Internal Medicine

## 2015-09-22 VITALS — BP 124/60 | HR 72 | Temp 98.0°F | Ht 72.0 in | Wt 219.9 lb

## 2015-09-22 DIAGNOSIS — R1310 Dysphagia, unspecified: Secondary | ICD-10-CM | POA: Diagnosis not present

## 2015-09-22 NOTE — Progress Notes (Signed)
Subjective:    Patient ID: [name], male    DOB: 07-20-1945, 70 y.o.   MRN: [id number]  HPI  Referred by Dr Hilma Favors for dysphagia. He tells me foods are lodging in his esophagus.  He really has to take his time eating his foods. Symptoms x 6 months. Meats and pork rinds bother him. He has to drink liquids behind the bolus. States his BMs are normal. No melena or BRRB. His last colonoscopy is UTD (2014). Examination performed to cecum. Small polyp ablated via cold biopsy from proximal transverse colon. Multiple diverticula at sigmoid colon. Small external hemorrhoids. Biopsy: Tubular adenoma.    Hx significant for CAD (CABG and cardiac stents and maintained on Plavix and ASA.)  Review of Systems Past Medical History:  Diagnosis Date  . Anginal pain ([place])   . Arthritis    "all over"  . Chronic lower back pain   . Coronary artery disease    a. s/p CABG x 2 (LIMA->LAD, RIMA->RCA);  b. s/p multiple PCI's to Ramus;  c. 08/2011 Cath/PCI: LM 70% into ramus with 70-80% there->treated wtih 3.5x18 Xience Xpedition DES, LCX  nonobs, RCA occluded.  RIMA & LIMA patent, EF 55-65%  . DJD (degenerative joint disease)   . GERD (gastroesophageal reflux disease)   . History of gout   . Hyperlipidemia   . Hypertension   . Hypothyroidism   . Neuropathy ([place])   . OSA on CPAP   . Prostate cancer [facility])     Past Surgical History:  Procedure Laterality Date  . ABDOMINAL HERNIA REPAIR    . CARDIAC CATHETERIZATION N/A 04/10/2015   Procedure: Left Heart Cath and Cors/Grafts Angiography;  Surgeon: Sherren Mocha, MD;  Location: [facility] CV LAB;  Service: Cardiovascular;  Laterality: N/A;  . CARDIAC CATHETERIZATION N/A 04/10/2015   Procedure: Coronary Stent Intervention;  Surgeon: Sherren Mocha, MD;  Location: [facility];  Service: Cardiovascular;  Laterality: N/A;  . CATARACT EXTRACTION W/PHACO Right 06/23/2015   Procedure: CATARACT EXTRACTION PHACO AND INTRAOCULAR LENS PLACEMENT  RIGHT EYE; CDE:  7.08;  Surgeon: Tonny Branch, MD;  Location: [facility];  Service: Ophthalmology;  Laterality: Right;  . CATARACT EXTRACTION W/PHACO Left 08/25/2015   Procedure: CATARACT EXTRACTION PHACO AND INTRAOCULAR LENS PLACEMENT LEFT EYE; CDE:  8.18;  Surgeon: Tonny Branch, MD;  Location: [facility];  Service: Ophthalmology;  Laterality: Left;  . COLONOSCOPY N/A 12/28/2012   Procedure: COLONOSCOPY;  Surgeon: Rogene Houston, MD;  Location: [facility];  Service: Endoscopy;  Laterality: N/A;  830 rescheduled  . CORONARY ANGIOPLASTY WITH STENT PLACEMENT  2013; 04/10/2015   "this makes me a total of 7" (04/10/2015)  . CORONARY ARTERY BYPASS GRAFT  1997   with (LIMA)  . HERNIA REPAIR Left   . KNEE CARTILAGE SURGERY Bilateral   . LEFT HEART CATHETERIZATION WITH CORONARY ANGIOGRAM N/A 09/08/2011   Procedure: LEFT HEART CATHETERIZATION WITH CORONARY ANGIOGRAM;  Surgeon: Sherren Mocha, MD;  Location: [facility] CATH LAB;  Service: Cardiovascular;  Laterality: N/A;  . PERCUTANEOUS CORONARY STENT INTERVENTION (PCI-S) Right 09/08/2011   Procedure: PERCUTANEOUS CORONARY STENT INTERVENTION (PCI-S);  Surgeon: Sherren Mocha, MD;  Location: [facility] CATH LAB;  Service: Cardiovascular;  Laterality: Right;  . ROBOT ASSISTED LAPAROSCOPIC RADICAL PROSTATECTOMY      Allergies  Allergen Reactions  . Codeine Nausea And Vomiting  . Pollen Extract Other (See Comments)    Seasonal    Current Outpatient Prescriptions on File Prior to Visit  Medication Sig Dispense Refill  .  amLODipine (NORVASC) 5 MG tablet Take 5 mg by mouth daily.    Marland Kitchen aspirin 81 MG tablet Take 81 mg by mouth at bedtime.     Marland Kitchen atorvastatin (LIPITOR) 40 MG tablet Take 1 tablet (40 mg total) by mouth daily at 6 PM. 30 tablet 11  . B Complex-C (SUPER B COMPLEX PO) Take 1 tablet by mouth daily.    . celecoxib (CELEBREX) 200 MG capsule Take 1 capsule (200 mg total) by mouth daily.    . Cholecalciferol (VITAMIN D PO) Take 1,000 Units by mouth daily.     . clopidogrel  (PLAVIX) 75 MG tablet Take 1 tablet (75 mg total) by mouth daily. 30 tablet 11  . Coenzyme Q10 (COQ10) 200 MG CAPS Take 200 mg by mouth daily.     . cyanocobalamin (,VITAMIN B-12,) 1000 MCG/ML injection Inject 1,000 mcg into the muscle every 30 (thirty) days.   2  . gabapentin (NEURONTIN) 600 MG tablet Take 600 mg by mouth 3 (three) times daily.  3  . glucosamine-chondroitin 500-400 MG tablet Take 1 tablet by mouth daily.    . isosorbide mononitrate (IMDUR) 30 MG 24 hr tablet TAKE 1 TABLET(30 MG) BY MOUTH DAILY 30 tablet 8  . levothyroxine (SYNTHROID, LEVOTHROID) 150 MCG tablet Take 150 mcg by mouth daily before breakfast.    . lisinopril (PRINIVIL,ZESTRIL) 5 MG tablet TAKE 1 TABLET BY MOUTH EVERY NIGHT AT BEDTIME 90 tablet 2  . metoprolol tartrate (LOPRESSOR) 25 MG tablet Take 25 mg by mouth daily.     . Multiple Vitamin (MULTIVITAMIN) tablet Take 1 tablet by mouth every other day.     . nitroGLYCERIN (NITROSTAT) 0.4 MG SL tablet Place 1 tablet (0.4 mg total) under the tongue every 5 (five) minutes as needed. For chest pain 25 tablet 3  . Omega-3 Fatty Acids (FISH OIL) 1200 MG CAPS Take 2 capsules by mouth 2 (two) times daily.     . pantoprazole (PROTONIX) 40 MG tablet Take 1 tablet (40 mg total) by mouth daily. 30 tablet 6  . traMADol (ULTRAM) 50 MG tablet Take 50 mg by mouth every 8 (eight) hours as needed for moderate pain.   1  . amLODipine (NORVASC) 5 MG tablet TAKE 1 TABLET BY MOUTH EVERY DAY (Patient not taking: Reported on 09/22/2015) 90 tablet 2   No current facility-administered medications on file prior to visit.        Objective:   Physical Exam Blood pressure 124/60, pulse 72, temperature 98 F (36.7 C), height 6' (1.829 m), weight 219 lb 14.4 oz (99.7 kg). Alert and oriented. Skin warm and dry. Oral mucosa is moist.   . Sclera anicteric, conjunctivae is pink. Thyroid not enlarged. No cervical lymphadenopathy. Lungs clear. Heart regular rate and rhythm.  Abdomen is soft. Bowel  sounds are positive. No hepatomegaly. No abdominal masses felt. No tenderness.  No edema to lower extremities.          Assessment & Plan:  Dysphagia.  Stricture needs to be ruled out. EGD/ED.

## 2015-09-22 NOTE — Patient Instructions (Signed)
EGD/ED. The risks and benefits such as perforation, bleeding, and infection were reviewed with the patient and is agreeable. 

## 2015-09-29 ENCOUNTER — Telehealth: Payer: Self-pay | Admitting: Cardiovascular Disease

## 2015-09-29 NOTE — Telephone Encounter (Signed)
New message      Request for surgical clearance:  1. What type of surgery is being performed? endoscopy  2. When is this surgery scheduled? 11-26-15  3. Are there any medications that need to be held prior to surgery and how long? Stop plavix 5 day prior  Name of physician performing surgery? Dr Matthew Saras 4. What is your office phone and fax number?  Fax [phone] or put in epic

## 2015-09-29 NOTE — Telephone Encounter (Signed)
Pt is s/p PCI in March 2017. Ideally he would take ASA and plavix without interruption x 12 months, but reviewed chart and he is having dysphagia. Suspect not appropriate to wait another 6 months for EGD. At low-risk of holding plavix 5 days if needed. Should stay on ASA without interruption.

## 2015-09-30 NOTE — Telephone Encounter (Signed)
Clearance faxed to [phone].

## 2015-10-06 NOTE — Telephone Encounter (Signed)
Will continue patient on Aspirin

## 2015-10-18 DIAGNOSIS — G4733 Obstructive sleep apnea (adult) (pediatric): Secondary | ICD-10-CM | POA: Diagnosis not present

## 2015-11-03 ENCOUNTER — Ambulatory Visit (INDEPENDENT_AMBULATORY_CARE_PROVIDER_SITE_OTHER): Payer: Medicare Other | Admitting: Cardiovascular Disease

## 2015-11-03 ENCOUNTER — Encounter: Payer: Self-pay | Admitting: Cardiovascular Disease

## 2015-11-03 VITALS — BP 122/62 | HR 68 | Ht 72.0 in | Wt 222.8 lb

## 2015-11-03 DIAGNOSIS — I25810 Atherosclerosis of coronary artery bypass graft(s) without angina pectoris: Secondary | ICD-10-CM | POA: Diagnosis not present

## 2015-11-03 DIAGNOSIS — I10 Essential (primary) hypertension: Secondary | ICD-10-CM | POA: Diagnosis not present

## 2015-11-03 DIAGNOSIS — E785 Hyperlipidemia, unspecified: Secondary | ICD-10-CM

## 2015-11-03 NOTE — Patient Instructions (Signed)
Medication Instructions:  Your physician recommends that you continue on your current medications as directed. Please refer to the Current Medication list given to you today.  Labwork: No new orders.   Testing/Procedures: No new orders.   Follow-Up: Your physician wants you to follow-up in: 6 MONTHS with Dr Burt Knack.  You will receive a reminder letter in the mail two months in advance. If you don't receive a letter, please call our office to schedule the follow-up appointment.   Any Other Special Instructions Will Be Listed Below (If Applicable).  You can hold plavix 5 days prior to GI procedure. Do not stop Aspirin.  Please resume Plavix ASAP after procedure.    If you need a refill on your cardiac medications before your next appointment, please call your pharmacy.

## 2015-11-03 NOTE — Progress Notes (Signed)
Cardiology Office Note Date:  11/03/2015   ID:  Steve, Mack [birth year], MRN [id number]  PCP:  Purvis Kilts, MD  Cardiologist:  Sherren Mocha, MD    Chief Complaint  Patient presents with  . Coronary Artery Disease     History of Present Illness: [name] is a 70 y.o. male who presents for follow-up evaluation. He has CAD and underwent 2v CABG with LIMA to LAD and RIMA to RCA 1997, hypertension, hyperlipidemia, hypothyroidism and chronic back pain. Underwent PCI in March 2017 after presenting with exertional angina and an abnormal nuclear scan. Treated with overlapping DES in the distal RCA through the graft to the RCA.  He has done well since his PCI procedure with no recurrent chest discomfort or shortness of breath. He has developed GERD and dysphagia and will require EGD and possibly esophageal dilatation. He has no other complaints and is doing very well at present.    Past Medical History:  Diagnosis Date  . Anginal pain ([place])   . Arthritis    "all over"  . Chronic lower back pain   . Coronary artery disease    a. s/p CABG x 2 (LIMA->LAD, RIMA->RCA);  b. s/p multiple PCI's to Ramus;  c. 08/2011 Cath/PCI: LM 70% into ramus with 70-80% there->treated wtih 3.5x18 Xience Xpedition DES, LCX  nonobs, RCA occluded.  RIMA & LIMA patent, EF 55-65%  . DJD (degenerative joint disease)   . GERD (gastroesophageal reflux disease)   . History of gout   . Hyperlipidemia   . Hypertension   . Hypothyroidism   . Neuropathy ([place])   . OSA on CPAP   . Prostate cancer [facility])     Past Surgical History:  Procedure Laterality Date  . ABDOMINAL HERNIA REPAIR    . CARDIAC CATHETERIZATION N/A 04/10/2015   Procedure: Left Heart Cath and Cors/Grafts Angiography;  Surgeon: Sherren Mocha, MD;  Location: [place] CV LAB;  Service: Cardiovascular;  Laterality: N/A;  . CARDIAC CATHETERIZATION N/A 04/10/2015   Procedure: Coronary Stent Intervention;  Surgeon: Sherren Mocha, MD;  Location: [place] [facility];  Service: Cardiovascular;  Laterality: N/A;  . CATARACT EXTRACTION W/PHACO Right 06/23/2015   Procedure: CATARACT EXTRACTION PHACO AND INTRAOCULAR LENS PLACEMENT RIGHT EYE; CDE:  7.08;  Surgeon: Tonny Branch, MD;  Location: [facility];  Service: Ophthalmology;  Laterality: Right;  . CATARACT EXTRACTION W/PHACO Left 08/25/2015   Procedure: CATARACT EXTRACTION PHACO AND INTRAOCULAR LENS PLACEMENT LEFT EYE; CDE:  8.18;  Surgeon: Tonny Branch, MD;  Location: [facility];  Service: Ophthalmology;  Laterality: Left;  . COLONOSCOPY N/A 12/28/2012   Procedure: COLONOSCOPY;  Surgeon: Rogene Houston, MD;  [facility];  Service: Endoscopy;  Laterality: N/A;  830 rescheduled  . CORONARY ANGIOPLASTY WITH STENT PLACEMENT  2013; 04/10/2015   "this makes me a total of 7" (04/10/2015)  . CORONARY ARTERY BYPASS GRAFT  1997   with (LIMA)  . HERNIA REPAIR Left   . KNEE CARTILAGE SURGERY Bilateral   . LEFT HEART CATHETERIZATION WITH CORONARY ANGIOGRAM N/A 09/08/2011   Procedure: LEFT HEART CATHETERIZATION WITH CORONARY ANGIOGRAM;  Surgeon: Sherren Mocha, MD;  Location: [facility] CATH LAB;  Service: Cardiovascular;  Laterality: N/A;  . PERCUTANEOUS CORONARY STENT INTERVENTION (PCI-S) Right 09/08/2011   Procedure: PERCUTANEOUS CORONARY STENT INTERVENTION (PCI-S);  Surgeon: Sherren Mocha, MD;  Location: [facility] CATH LAB;  Service: Cardiovascular;  Laterality: Right;  . ROBOT ASSISTED LAPAROSCOPIC RADICAL PROSTATECTOMY      Current Outpatient  Prescriptions  Medication Sig Dispense Refill  . amLODipine (NORVASC) 5 MG tablet Take 5 mg by mouth daily.    Marland Kitchen aspirin 81 MG tablet Take 81 mg by mouth at bedtime.     Marland Kitchen atorvastatin (LIPITOR) 40 MG tablet Take 1 tablet (40 mg total) by mouth daily at 6 PM. 30 tablet 11  . B Complex-C (SUPER B COMPLEX PO) Take 1 tablet by mouth daily.    . celecoxib (CELEBREX) 200 MG capsule Take 1 capsule (200 mg total) by mouth daily.    . Cholecalciferol  (VITAMIN D PO) Take 1,000 Units by mouth daily.     . clopidogrel (PLAVIX) 75 MG tablet Take 1 tablet (75 mg total) by mouth daily. 30 tablet 11  . Coenzyme Q10 (COQ10) 200 MG CAPS Take 200 mg by mouth daily.     . cyanocobalamin (,VITAMIN B-12,) 1000 MCG/ML injection Inject 1,000 mcg into the muscle every 30 (thirty) days.   2  . gabapentin (NEURONTIN) 600 MG tablet Take 600 mg by mouth 3 (three) times daily.  3  . glucosamine-chondroitin 500-400 MG tablet Take 1 tablet by mouth daily.    . isosorbide mononitrate (IMDUR) 30 MG 24 hr tablet TAKE 1 TABLET(30 MG) BY MOUTH DAILY 30 tablet 8  . levothyroxine (SYNTHROID, LEVOTHROID) 150 MCG tablet Take 150 mcg by mouth daily before breakfast.    . lisinopril (PRINIVIL,ZESTRIL) 5 MG tablet TAKE 1 TABLET BY MOUTH EVERY NIGHT AT BEDTIME 90 tablet 2  . metoprolol tartrate (LOPRESSOR) 25 MG tablet Take 25 mg by mouth daily.     . Multiple Vitamin (MULTIVITAMIN) tablet Take 1 tablet by mouth every other day.     . nitroGLYCERIN (NITROSTAT) 0.4 MG SL tablet Place 1 tablet (0.4 mg total) under the tongue every 5 (five) minutes as needed. For chest pain 25 tablet 3  . Omega-3 Fatty Acids (FISH OIL) 1200 MG CAPS Take 2 capsules by mouth 2 (two) times daily.     . pantoprazole (PROTONIX) 40 MG tablet Take 40 mg by mouth 2 (two) times daily.    . traMADol (ULTRAM) 50 MG tablet Take 50 mg by mouth every 8 (eight) hours as needed for moderate pain.   1   No current facility-administered medications for this visit.     Allergies:   Codeine and Pollen extract   Social History:  The patient  reports that he has quit smoking. His smoking use included Cigars. He quit after 12.00 years of use. He has never used smokeless tobacco. He reports that he drinks alcohol. He reports that he does not use drugs.   Family History:  The patient's  family history includes Cancer (age of onset: 62) in his father; Stroke (age of onset: 64) in his mother.    ROS:  Please see the  history of present illness.  Otherwise, review of systems is positive for hearing loss, back pain, muscle pain, dizziness, easy bruising.  All other systems are reviewed and negative.    PHYSICAL EXAM: VS:  BP 122/62   Pulse 68   Ht 6' (1.829 m)   Wt 101.1 kg (222 lb 12.8 oz)   BMI 30.22 kg/m  , BMI Body mass index is 30.22 kg/m. GEN: Well nourished, well developed, in no acute distress  HEENT: normal  Neck: no JVD, no masses. No carotid bruits Cardiac: RRR without murmur or gallop                Respiratory:  clear  to auscultation bilaterally, normal work of breathing GI: soft, nontender, nondistended, + BS MS: no deformity or atrophy  Ext: no pretibial edema, pedal pulses 2+= bilaterally Skin: warm and dry, no rash Neuro:  Strength and sensation are intact Psych: euthymic mood, full affect  EKG:  EKG is ordered today. The ekg ordered today shows NSR, incomplete RBBB, age-indeterminate inferior infarct  Recent Labs: 04/11/2015: BUN 15; Creatinine, Ser 1.18; Hemoglobin 11.9; Platelets 163; Potassium 4.1; Sodium 139 05/20/2015: ALT 21   Lipid Panel  No results found for: CHOL, TRIG, HDL, CHOLHDL, VLDL, LDLCALC, LDLDIRECT    Wt Readings from Last 3 Encounters:  11/03/15 101.1 kg (222 lb 12.8 oz)  09/22/15 99.7 kg (219 lb 14.4 oz)  08/25/15 98 kg (216 lb)     Cardiac Studies Reviewed: Cardiac Cath Conclusion    Mid RCA lesion, 100% stenosed.  RIMA .  Patent graft - free RIMA  Prox LAD lesion, 100% stenosed.  LIMA .  LIMA-LAD patent  Ost LM to LM lesion, 25% stenosed. The lesion was previously treated with a stent (unknown type).  Ost Ramus to Ramus lesion, 25% stenosed. The lesion was previously treated with a stent (unknown type).  Ost RPDA lesion, 100% stenosed. Post intervention, there is a 0% residual stenosis.  Dist RCA lesion, 99% stenosed. Post intervention, there is a 0% residual stenosis.   1. Severe native vessel CAD with total occlusion of the  RCA, total occlusion of the LAD, and patency of the stented segment of the left main into the ramus.  2. Severe stenosis of the distal RCA treated successfully with PCI (drug-eluting stent) 3. Total occlusion of the PDA following distal RCA stenting, likely due to plaque shifting, treated successfully with PCI (DES), but with distal vessel occlusion possibly due to distal vessel dissection (pt asymptomatic)  Continue DAPT. Will transition to [place] tomorrow am. Pt would be a candidate for the Twilight Trial if he meets criteria.    Indications   Exertional angina (HCC) [I20.8 ([id number]  Complications   Complications documented in old activity   INDICATION: Exertional angina, abnormal Myoview   PROCEDURAL DETAILS: The left wrist was prepped, draped, and anesthetized with 1% lidocaine. Using the modified Seldinger technique, a 5/6 French Slender sheath was introduced into the left radial artery. 3 mg of verapamil was administered through the sheath, weight-based unfractionated heparin was administered intravenously. Standard Judkins catheters were used for selective coronary angiography and LIMA/SVG angiography. Catheter exchanges were performed over an exchange length guidewire. PCI is performed - See PCI section for details. There were no immediate procedural complications. A TR band was used for radial hemostasis at the completion of the procedure. The patient was transferred to the post catheterization recovery area for further monitoring.   During this procedure the patient is administered a total of Versed 2 mg and Fentanyl 25 mg to achieve and maintain moderate conscious sedation. The patient's heart rate, blood pressure, and oxygen saturation are monitored continuously during the procedure. The period of conscious sedation is 74 minutes, of which I was present face-to-face 100% of this time.  Estimated blood loss <50 mL. There were no immediate complications during the procedure.       Coronary Findings   Dominance: Right  Left Main  Ost LM to LM lesion, 25% stenosed. The lesion was previously treated with a stent (unknown type).  Left Anterior Descending  Prox LAD lesion, 100% stenosed.  Ramus Intermedius  Ost Ramus to Ramus lesion, 25% stenosed. The lesion  was previously treated with a stent (unknown type).  Right Coronary Artery  Mid RCA lesion, 100% stenosed.  Dist RCA lesion, 99% stenosed.  PCI: The pre-interventional distal flow is normal (TIMI 3). Pre-stent angioplasty was performed. A drug-eluting stent was placed. Post-stent angioplasty was performed. The post-interventional distal flow is normal (TIMI 3). The intervention was successful. No complications occurred at this lesion. The patient has been on long-term dual antiplatelet therapy with aspirin and Plavix. Heparin is used for anticoagulation. A 6 [place] multipurpose guide catheters used. A cougar wire is advanced across the lesion. The lesion is predilated with a 2.0 mm balloon. The lesion is then stented with a 2.25 x 18 mm resolute DES. After the lesion is stented, there is a distal plaque shift and the PDA is occluded. The cougar wire is left in place and a whisper wire was advanced across the lesion. I tried to advance a balloon but it would not cross. Wire position is lost in a new whisper wire was used to cross the lesion. I was unable to dilate the PDA with a 2.0 mm balloon. The PDA is then stented in overlapping fashion with the distal RCA stent using a 2.25 x 18 mm resolute DES. The distal PDA is subtotally occluded and there may be dissection distally. The patient was chest pain and symptom-free. I attempted to rewire the PDA and the distal occlusion was so far out that I did not feel comfortable using another balloon. The patient remained stable throughout and had no chest pain. There was 0% residual stenosis at the lesion site. There was 0% residual stenosis at the PDA site. There was TIMI-3 flow  through the lesion with distal PDA occlusion likely related to dissection.  There is no residual stenosis post intervention.  Right Posterior Descending Artery  Ost RPDA lesion, 100% stenosed.  PCI: There is no pre-interventional antegrade distal flow (TIMI 0). Pre-stent angioplasty was performed. A drug-eluting stent was placed. Post-stent angioplasty was performed. The post-interventional distal flow is normal (TIMI 3). The intervention was successful. At this lesion, a dissection occurred.  There is no residual stenosis post intervention.  Graft Angiography  RIMA Graft to Mid RCA  RIMA Patent graft - free RIMA  Free LIMA Graft to Mid LAD  LIMA LIMA-LAD patent  Coronary Diagrams   Diagnostic Diagram     Post-Intervention Diagram        ASSESSMENT AND PLAN: 1.  CAD, native vessel: s/p CABG and PCI: doing well without angina. Tolerating DAPT well. Will need EGD and since he is > 6 months out from PCI, reasonable to hold plavix x 5 days for the procedure, and it should be resumed after the procedure ASAP. I asked him to continue on ASA 81 mg without interruption as long as this is ok with Dr Laural Golden. This will reduce his chance of a coronary event.   2. Essential HTN: BP well-controlled  3. Hyperlipidemia: continue atorvastatin 40 mg - lipids have been followed by Dr Hilma Favors  Current medicines are reviewed with the patient today.  The patient does not have concerns regarding medicines.  Labs/ tests ordered today include:  No orders of the defined types were placed in this encounter.  Disposition:   FU 6 months  Signed, Sherren Mocha, MD  11/03/2015 4:18 PM    [facility] [place], [place], [place]  [address] Phone: [phone]; Fax: [phone]

## 2015-11-18 DIAGNOSIS — G4733 Obstructive sleep apnea (adult) (pediatric): Secondary | ICD-10-CM | POA: Diagnosis not present

## 2015-11-19 DIAGNOSIS — Z23 Encounter for immunization: Secondary | ICD-10-CM | POA: Diagnosis not present

## 2015-11-26 ENCOUNTER — Ambulatory Visit (HOSPITAL_COMMUNITY)
Admission: RE | Admit: 2015-11-26 | Discharge: 2015-11-26 | Disposition: A | Discharge status: Home / Self Care | Payer: Medicare Other | Source: Ambulatory Visit | Attending: Internal Medicine | Admitting: Internal Medicine

## 2015-11-26 ENCOUNTER — Encounter (HOSPITAL_COMMUNITY): Payer: Self-pay | Admitting: *Deleted

## 2015-11-26 ENCOUNTER — Encounter (HOSPITAL_COMMUNITY)
Admission: RE | Disposition: A | Discharge status: Home / Self Care | Payer: Self-pay | Source: Ambulatory Visit | Attending: Internal Medicine

## 2015-11-26 DIAGNOSIS — M545 Low back pain: Secondary | ICD-10-CM | POA: Insufficient documentation

## 2015-11-26 DIAGNOSIS — I10 Essential (primary) hypertension: Secondary | ICD-10-CM | POA: Diagnosis not present

## 2015-11-26 DIAGNOSIS — G629 Polyneuropathy, unspecified: Secondary | ICD-10-CM | POA: Insufficient documentation

## 2015-11-26 DIAGNOSIS — Z79899 Other long term (current) drug therapy: Secondary | ICD-10-CM | POA: Insufficient documentation

## 2015-11-26 DIAGNOSIS — E039 Hypothyroidism, unspecified: Secondary | ICD-10-CM | POA: Diagnosis not present

## 2015-11-26 DIAGNOSIS — R1314 Dysphagia, pharyngoesophageal phase: Secondary | ICD-10-CM | POA: Insufficient documentation

## 2015-11-26 DIAGNOSIS — Z885 Allergy status to narcotic agent status: Secondary | ICD-10-CM | POA: Insufficient documentation

## 2015-11-26 DIAGNOSIS — K2950 Unspecified chronic gastritis without bleeding: Secondary | ICD-10-CM | POA: Diagnosis not present

## 2015-11-26 DIAGNOSIS — E785 Hyperlipidemia, unspecified: Secondary | ICD-10-CM | POA: Diagnosis not present

## 2015-11-26 DIAGNOSIS — I2510 Atherosclerotic heart disease of native coronary artery without angina pectoris: Secondary | ICD-10-CM | POA: Diagnosis not present

## 2015-11-26 DIAGNOSIS — Z8546 Personal history of malignant neoplasm of prostate: Secondary | ICD-10-CM | POA: Insufficient documentation

## 2015-11-26 DIAGNOSIS — Z9109 Other allergy status, other than to drugs and biological substances: Secondary | ICD-10-CM | POA: Insufficient documentation

## 2015-11-26 DIAGNOSIS — Z951 Presence of aortocoronary bypass graft: Secondary | ICD-10-CM | POA: Insufficient documentation

## 2015-11-26 DIAGNOSIS — K2270 Barrett's esophagus without dysplasia: Secondary | ICD-10-CM | POA: Insufficient documentation

## 2015-11-26 DIAGNOSIS — Z87891 Personal history of nicotine dependence: Secondary | ICD-10-CM | POA: Diagnosis not present

## 2015-11-26 DIAGNOSIS — M1990 Unspecified osteoarthritis, unspecified site: Secondary | ICD-10-CM | POA: Insufficient documentation

## 2015-11-26 DIAGNOSIS — Z955 Presence of coronary angioplasty implant and graft: Secondary | ICD-10-CM | POA: Diagnosis not present

## 2015-11-26 DIAGNOSIS — G8929 Other chronic pain: Secondary | ICD-10-CM | POA: Diagnosis not present

## 2015-11-26 DIAGNOSIS — Z7982 Long term (current) use of aspirin: Secondary | ICD-10-CM | POA: Insufficient documentation

## 2015-11-26 DIAGNOSIS — Z9841 Cataract extraction status, right eye: Secondary | ICD-10-CM | POA: Diagnosis not present

## 2015-11-26 DIAGNOSIS — K219 Gastro-esophageal reflux disease without esophagitis: Secondary | ICD-10-CM | POA: Insufficient documentation

## 2015-11-26 DIAGNOSIS — M109 Gout, unspecified: Secondary | ICD-10-CM | POA: Diagnosis not present

## 2015-11-26 DIAGNOSIS — B9681 Helicobacter pylori [H. pylori] as the cause of diseases classified elsewhere: Secondary | ICD-10-CM | POA: Diagnosis not present

## 2015-11-26 DIAGNOSIS — K3189 Other diseases of stomach and duodenum: Secondary | ICD-10-CM | POA: Insufficient documentation

## 2015-11-26 DIAGNOSIS — Z9842 Cataract extraction status, left eye: Secondary | ICD-10-CM | POA: Diagnosis not present

## 2015-11-26 DIAGNOSIS — G4733 Obstructive sleep apnea (adult) (pediatric): Secondary | ICD-10-CM | POA: Diagnosis not present

## 2015-11-26 DIAGNOSIS — R1310 Dysphagia, unspecified: Secondary | ICD-10-CM | POA: Insufficient documentation

## 2015-11-26 HISTORY — PX: BIOPSY: SHX5522

## 2015-11-26 HISTORY — PX: ESOPHAGEAL DILATION: SHX303

## 2015-11-26 HISTORY — PX: ESOPHAGOGASTRODUODENOSCOPY: SHX5428

## 2015-11-26 SURGERY — EGD (ESOPHAGOGASTRODUODENOSCOPY)
Anesthesia: Moderate Sedation

## 2015-11-26 MED ORDER — STERILE WATER FOR IRRIGATION IR SOLN
Status: DC | PRN
  Administered 2015-11-26: 13:00:00

## 2015-11-26 MED ORDER — BUTAMBEN-TETRACAINE-BENZOCAINE 2-2-14 % EX AERO
INHALATION_SPRAY | CUTANEOUS | Status: DC | PRN
  Administered 2015-11-26: 2 via TOPICAL

## 2015-11-26 MED ORDER — MEPERIDINE HCL 50 MG/ML IJ SOLN
INTRAMUSCULAR | Status: DC | PRN
  Administered 2015-11-26 (×2): 25 mg via INTRAVENOUS

## 2015-11-26 MED ORDER — MIDAZOLAM HCL 5 MG/5ML IJ SOLN
INTRAMUSCULAR | Status: DC | PRN
  Administered 2015-11-26 (×2): 2 mg via INTRAVENOUS
  Administered 2015-11-26: 1 mg via INTRAVENOUS

## 2015-11-26 MED ORDER — BUTAMBEN-TETRACAINE-BENZOCAINE 2-2-14 % EX AERO
INHALATION_SPRAY | CUTANEOUS | Status: AC
  Filled 2015-11-26: qty 20

## 2015-11-26 MED ORDER — MEPERIDINE HCL 50 MG/ML IJ SOLN
INTRAMUSCULAR | Status: AC
  Filled 2015-11-26: qty 1

## 2015-11-26 MED ORDER — SODIUM CHLORIDE 0.9 % IV SOLN
INTRAVENOUS | Status: DC
  Administered 2015-11-26: 1000 mL via INTRAVENOUS

## 2015-11-26 MED ORDER — MIDAZOLAM HCL 5 MG/5ML IJ SOLN
INTRAMUSCULAR | Status: AC
  Filled 2015-11-26: qty 10

## 2015-11-26 NOTE — H&P (Signed)
[name] is an 70 y.o. male.   Chief Complaint: Patient is here for EGD and ED. HPI: She is 70 year old Caucasian male with multiple medical problems who presents with over 4 month history of dysphagia to solids. It is intermittent. He points to suprasternal area soft bolus obstruction. He has most difficulty with meats.Marland Kitchen He is an pantoprazole but he does not experience heartburn. He said he was having bloating and is medications help him. He denies nausea vomiting melena, weight loss. Last dose of clopidogrel was on 11/22/2015 and aspirin was 11/25/2015.  Past Medical History:  Diagnosis Date  . Anginal pain ([place])   . Arthritis    "all over"  . Chronic lower back pain   . Coronary artery disease    a. s/p CABG x 2 (LIMA->LAD, RIMA->RCA);  b. s/p multiple PCI's to Ramus;  c. 08/2011 Cath/PCI: LM 70% into ramus with 70-80% there->treated wtih 3.5x18 Xience Xpedition DES, LCX  nonobs, RCA occluded.  RIMA & LIMA patent, EF 55-65%  . DJD (degenerative joint disease)   . GERD (gastroesophageal reflux disease)   . History of gout   . Hyperlipidemia   . Hypertension   . Hypothyroidism   . Neuropathy ([place])   . OSA on CPAP   . Prostate cancer [facility])     Past Surgical History:  Procedure Laterality Date  . ABDOMINAL HERNIA REPAIR    . CARDIAC CATHETERIZATION N/A 04/10/2015   Procedure: Left Heart Cath and Cors/Grafts Angiography;  Surgeon: Sherren Mocha, MD;  Location: [place] CV LAB;  Service: Cardiovascular;  Laterality: N/A;  . CARDIAC CATHETERIZATION N/A 04/10/2015   Procedure: Coronary Stent Intervention;  Surgeon: Sherren Mocha, MD;  Location: [place] CV LAB;  Service: Cardiovascular;  Laterality: N/A;  . CATARACT EXTRACTION W/PHACO Right 06/23/2015   Procedure: CATARACT EXTRACTION PHACO AND INTRAOCULAR LENS PLACEMENT RIGHT EYE; CDE:  7.08;  Surgeon: Tonny Branch, MD;  Location: [facility];  Service: Ophthalmology;  Laterality: Right;  . CATARACT EXTRACTION W/PHACO Left  08/25/2015   Procedure: CATARACT EXTRACTION PHACO AND INTRAOCULAR LENS PLACEMENT LEFT EYE; CDE:  8.18;  Surgeon: Tonny Branch, MD;  Location: [facility];  Service: Ophthalmology;  Laterality: Left;  . COLONOSCOPY N/A 12/28/2012   Procedure: COLONOSCOPY;  Surgeon: Rogene Houston, MD;  [facility];  Service: Endoscopy;  Laterality: N/A;  830 rescheduled  . CORONARY ANGIOPLASTY WITH STENT PLACEMENT  2013; 04/10/2015   "this makes me a total of 7" (04/10/2015)  . CORONARY ARTERY BYPASS GRAFT  1997   with (LIMA)  . HERNIA REPAIR Left   . KNEE CARTILAGE SURGERY Bilateral   . LEFT HEART CATHETERIZATION WITH CORONARY ANGIOGRAM N/A 09/08/2011   Procedure: LEFT HEART CATHETERIZATION WITH CORONARY ANGIOGRAM;  Surgeon: Sherren Mocha, MD;  Location: [facility] CATH LAB;  Service: Cardiovascular;  Laterality: N/A;  . PERCUTANEOUS CORONARY STENT INTERVENTION (PCI-S) Right 09/08/2011   Procedure: PERCUTANEOUS CORONARY STENT INTERVENTION (PCI-S);  Surgeon: Sherren Mocha, MD;  Location: [facility] CATH LAB;  Service: Cardiovascular;  Laterality: Right;  . ROBOT ASSISTED LAPAROSCOPIC RADICAL PROSTATECTOMY      Family History  Problem Relation Age of Onset  . Cancer Father 3    died  . Stroke Mother 60    died   Social History:  reports that he has quit smoking. His smoking use included Cigars. He quit after 12.00 years of use. He has never used smokeless tobacco. He reports that he drinks alcohol. He reports that he does not use drugs.  Allergies:  Allergies  Allergen Reactions  . Codeine Nausea And Vomiting  . Pollen Extract Other (See Comments)    Seasonal    Medications Prior to Admission  Medication Sig Dispense Refill  . amLODipine (NORVASC) 5 MG tablet Take 5 mg by mouth daily.    Marland Kitchen aspirin 81 MG tablet Take 81 mg by mouth at bedtime.     Marland Kitchen atorvastatin (LIPITOR) 40 MG tablet Take 1 tablet (40 mg total) by mouth daily at 6 PM. 30 tablet 11  . B Complex-C (SUPER B COMPLEX PO) Take 1 tablet by mouth  daily.    . celecoxib (CELEBREX) 200 MG capsule Take 1 capsule (200 mg total) by mouth daily.    . Cholecalciferol (VITAMIN D PO) Take 1,000 Units by mouth daily.     . clopidogrel (PLAVIX) 75 MG tablet Take 1 tablet (75 mg total) by mouth daily. 30 tablet 11  . Coenzyme Q10 (COQ10) 200 MG CAPS Take 200 mg by mouth daily.     . cyanocobalamin (,VITAMIN B-12,) 1000 MCG/ML injection Inject 1,000 mcg into the muscle every 30 (thirty) days.   2  . gabapentin (NEURONTIN) 600 MG tablet Take 600 mg by mouth 3 (three) times daily.  3  . glucosamine-chondroitin 500-400 MG tablet Take 1 tablet by mouth daily.    . isosorbide mononitrate (IMDUR) 30 MG 24 hr tablet TAKE 1 TABLET(30 MG) BY MOUTH DAILY 30 tablet 8  . levothyroxine (SYNTHROID, LEVOTHROID) 150 MCG tablet Take 150 mcg by mouth daily before breakfast.    . lisinopril (PRINIVIL,ZESTRIL) 5 MG tablet TAKE 1 TABLET BY MOUTH EVERY NIGHT AT BEDTIME 90 tablet 2  . metoprolol tartrate (LOPRESSOR) 25 MG tablet Take 25 mg by mouth daily.     . Multiple Vitamin (MULTIVITAMIN) tablet Take 1 tablet by mouth every other day.     . Omega-3 Fatty Acids (FISH OIL) 1200 MG CAPS Take 2 capsules by mouth 2 (two) times daily.     . pantoprazole (PROTONIX) 40 MG tablet Take 40 mg by mouth 2 (two) times daily.    . traMADol (ULTRAM) 50 MG tablet Take 50 mg by mouth every 8 (eight) hours as needed for moderate pain.   1  . nitroGLYCERIN (NITROSTAT) 0.4 MG SL tablet Place 1 tablet (0.4 mg total) under the tongue every 5 (five) minutes as needed. For chest pain 25 tablet 3    No results found for this or any previous visit (from the past 48 hour(s)). No results found.  ROS  Blood pressure 130/67, pulse 67, temperature 99 F (37.2 C), temperature source Oral, resp. rate 17, height 6' (1.829 m), weight 222 lb (100.7 kg), SpO2 100 %. Physical Exam  Constitutional: He appears well-developed and well-nourished.  HENT:  Mouth/Throat: Oropharynx is clear and moist.   Eyes: Conjunctivae are normal. No scleral icterus.  Neck: No thyromegaly present.  Cardiovascular:  Midsternal scar Normal S1 and S2. Grade 2/6 systolic ejection murmur best heard at left sternal border.  Respiratory: Effort normal and breath sounds normal.  GI: Soft. He exhibits no distension and no mass. There is no tenderness.  Musculoskeletal: He exhibits no edema.  Lymphadenopathy:    He has no cervical adenopathy.  Neurological: He is alert.  Skin: Skin is warm and dry.     Assessment/Plan Solid food dysphagia. EGD with ED.  Hildred Laser, MD 11/26/2015, 1:16 PM

## 2015-11-26 NOTE — Op Note (Signed)
[facility] Patient Name: [name] Procedure Date: 11/26/2015 12:51 PM MRN: [id number] Date of Birth: 1945/01/16 Attending MD: Hildred Laser , MD CSN: [id number] Age: 70 Admit Type: Outpatient Procedure:                Upper GI endoscopy Indications:              Esophageal dysphagia Providers:                Hildred Laser, MD, Otis Peak B. Sharon Seller, RN, Charlyne Petrin RN, RN, Randa Spike, Technician Referring MD:             [employer]mond [employer], MD Medicines:                Cetacaine spray, Meperidine 50 mg IV, Midazolam 5                            mg IV Complications:            No immediate complications. Estimated Blood Loss:     Estimated blood loss: 5 mL. Procedure:                Pre-Anesthesia Assessment:                           - Prior to the procedure, a History and Physical                            was performed, and patient medications and                            allergies were reviewed. The patient's tolerance of                            previous anesthesia was also reviewed. The risks                            and benefits of the procedure and the sedation                            options and risks were discussed with the patient.                            All questions were answered, and informed consent                            was obtained. Prior Anticoagulants: The patient                            last took aspirin 1 day and Plavix (clopidogrel) 4                            days prior to the procedure. ASA Grade Assessment:  III - A patient with severe systemic disease. After                            reviewing the risks and benefits, the patient was                            deemed in satisfactory condition to undergo the                            procedure.                           After obtaining informed consent, the endoscope was                            passed under direct  vision. Throughout the                            procedure, the patient's blood pressure, pulse, and                            oxygen saturations were monitored continuously. The                            [id number]) scope was introduced through the                            mouth, and advanced to the second part of duodenum.                            The upper GI endoscopy was accomplished without                            difficulty. The patient tolerated the procedure                            well. Scope In: 1:25:26 PM Scope Out: 1:43:16 PM Total Procedure Duration: 0 hours 17 minutes 50 seconds  Findings:      The proximal esophagus and mid esophagus were normal.      Three tongues of salmon-colored mucosa were present. No other visible       abnormalities were present. The maximum longitudinal extent of these       esophageal mucosal changes was 15 cm in length. Biopsies were taken with       a cold forceps for histology. The pathology specimen was placed into       Bottle Number 2.      The Z-line was irregular and was found 41 cm from the incisors.      No endoscopic abnormality was evident in the esophagus to explain the       patient's complaint of dysphagia. It was decided, however, to proceed       with dilation of the entire esophagus. The scope was withdrawn. Dilation       was performed with a Maloney dilator with no resistance at 34 Fr. The  dilation site was examined following endoscope reinsertion and showed no       change.      Patchy atrophic mucosa was found in the gastric fundus and in the       gastric body.      gastric biopsy taken      The exam was otherwise without abnormality.      The duodenal bulb and second portion of the duodenum were normal. Impression:               - Normal proximal esophagus and mid esophagus.                           - Salmon-colored mucosa consistent with                            short-segment Barrett's  esophagus. Biopsied.                           - Z-line irregular, 41 cm from the incisors.                           - No endoscopic esophageal abnormality to explain                            patient's dysphagia. Esophagus dilated prior to                            taking biopsies.                           - Gastric mucosal atrophy. Biopsy taken.                           - The examination was otherwise normal.                           - Normal duodenal bulb and second portion of the                            duodenum. Moderate Sedation:      Moderate (conscious) sedation was administered by the endoscopy nurse       and supervised by the endoscopist. The following parameters were       monitored: oxygen saturation, heart rate, blood pressure, CO2       capnography and response to care. Total physician intraservice time was       23 minutes. Recommendation:           - Patient has a contact number available for                            emergencies. The signs and symptoms of potential                            delayed complications were discussed with the  patient. Return to normal activities tomorrow.                            Written discharge instructions were provided to the                            patient.                           - Resume previous diet today.                           - Continue present medications.                           - Resume aspirin in 2 days and Plavix (clopidogrel)                            in 2 days at prior doses.                           - Await pathology results. Procedure Code(s):        --- Professional ---                           [phone], Esophagogastroduodenoscopy, flexible,                            transoral; with biopsy, single or multiple                           43450, Dilation of esophagus, by unguided sound or                            bougie, single or multiple passes                            99152, Moderate sedation services provided by the                            same physician or other qualified health care                            professional performing the diagnostic or                            therapeutic service that the sedation supports,                            requiring the presence of an independent trained                            observer to assist in the monitoring of the                            patient's level of consciousness  and physiological                            status; initial 15 minutes of intraservice time,                            patient age 62 years or older                           [id number], Moderate sedation services; each additional                            15 minutes intraservice time Diagnosis Code(s):        --- Professional ---                           K22.8, Other specified diseases of esophagus                           K31.89, Other diseases of stomach and duodenum                           R13.14, Dysphagia, pharyngoesophageal phase CPT copyright 2016 American Medical Association. All rights reserved. The codes documented in this report are preliminary and upon coder review may  be revised to meet current compliance requirements. Hildred Laser, MD Hildred Laser, MD 11/26/2015 1:58:19 PM This report has been signed electronically. Number of Addenda: 0

## 2015-11-26 NOTE — Procedures (Addendum)
Barretts length 15 mm

## 2015-11-26 NOTE — Discharge Instructions (Signed)
Resume aspirin and clopidogrel on 11/28/2015. Resume celecoxib on 11/28/2015. Resume other medications and diet as before. Do not drive for 24 hours. Physician will call with biopsy results.      Esophagogastroduodenoscopy, Care After Introduction Refer to this sheet in the next few weeks. These instructions provide you with information about caring for yourself after your procedure. Your health care provider may also give you more specific instructions. Your treatment has been planned according to current medical practices, but problems sometimes occur. Call your health care provider if you have any problems or questions after your procedure. What can I expect after the procedure? After the procedure, it is common to have:  A sore throat.  Nausea.  Bloating.  Dizziness.  Fatigue. Follow these instructions at home:  Do not eat or drink anything until the numbing medicine (local anesthetic) has worn off and your gag reflex has returned. You will know that the local anesthetic has worn off when you can swallow comfortably.  Do not drive for 24 hours if you received a medicine to help you relax (sedative).  If your health care provider took a tissue sample for testing during the procedure, make sure to get your test results. This is your responsibility. Ask your health care provider or the department performing the test when your results will be ready.  Keep all follow-up visits as told by your health care provider. This is important. Contact a health care provider if:  You cannot stop coughing.  You are not urinating.  You are urinating less than usual. Get help right away if:  You have trouble swallowing.  You cannot eat or drink.  You have throat or chest pain that gets worse.  You are dizzy or light-headed.  You faint.  You have nausea or vomiting.  You have chills.  You have a fever.  You have severe abdominal pain.  You have black, tarry, or bloody  stools. This information is not intended to replace advice given to you by your health care provider. Make sure you discuss any questions you have with your health care provider. Document Released: 12/15/2011 Document Revised: 06/05/2015 Document Reviewed: 11/21/2014  2017 Elsevier

## 2015-11-28 ENCOUNTER — Encounter (HOSPITAL_COMMUNITY): Payer: Self-pay | Admitting: Internal Medicine

## 2015-11-29 ENCOUNTER — Other Ambulatory Visit (INDEPENDENT_AMBULATORY_CARE_PROVIDER_SITE_OTHER): Payer: Self-pay | Admitting: Internal Medicine

## 2015-11-29 MED ORDER — BIS SUBCIT-METRONID-TETRACYC 140-125-125 MG PO CAPS: 3.0000 | ORAL_CAPSULE | Freq: Three times a day (TID) | ORAL | 0 refills | Status: DC

## 2015-12-18 DIAGNOSIS — G4733 Obstructive sleep apnea (adult) (pediatric): Secondary | ICD-10-CM | POA: Diagnosis not present

## 2016-01-09 DIAGNOSIS — C672 Malignant neoplasm of lateral wall of bladder: Secondary | ICD-10-CM | POA: Diagnosis not present

## 2016-01-09 DIAGNOSIS — N359 Urethral stricture, unspecified: Secondary | ICD-10-CM | POA: Diagnosis not present

## 2016-01-09 DIAGNOSIS — C61 Malignant neoplasm of prostate: Secondary | ICD-10-CM | POA: Diagnosis not present

## 2016-01-14 DIAGNOSIS — C61 Malignant neoplasm of prostate: Secondary | ICD-10-CM | POA: Diagnosis not present

## 2016-01-14 DIAGNOSIS — I10 Essential (primary) hypertension: Secondary | ICD-10-CM | POA: Diagnosis not present

## 2016-01-14 DIAGNOSIS — E039 Hypothyroidism, unspecified: Secondary | ICD-10-CM | POA: Diagnosis not present

## 2016-01-14 DIAGNOSIS — E782 Mixed hyperlipidemia: Secondary | ICD-10-CM | POA: Diagnosis not present

## 2016-01-14 DIAGNOSIS — Z1389 Encounter for screening for other disorder: Secondary | ICD-10-CM | POA: Diagnosis not present

## 2016-01-14 DIAGNOSIS — R7309 Other abnormal glucose: Secondary | ICD-10-CM | POA: Diagnosis not present

## 2016-01-18 DIAGNOSIS — G4733 Obstructive sleep apnea (adult) (pediatric): Secondary | ICD-10-CM | POA: Diagnosis not present

## 2016-02-18 DIAGNOSIS — G4733 Obstructive sleep apnea (adult) (pediatric): Secondary | ICD-10-CM | POA: Diagnosis not present

## 2016-03-09 ENCOUNTER — Other Ambulatory Visit: Payer: Self-pay | Admitting: Physician Assistant

## 2016-03-17 DIAGNOSIS — G4733 Obstructive sleep apnea (adult) (pediatric): Secondary | ICD-10-CM | POA: Diagnosis not present

## 2016-03-31 ENCOUNTER — Other Ambulatory Visit: Payer: Self-pay | Admitting: Cardiovascular Disease

## 2016-04-17 DIAGNOSIS — G4733 Obstructive sleep apnea (adult) (pediatric): Secondary | ICD-10-CM | POA: Diagnosis not present

## 2016-04-21 DIAGNOSIS — L03031 Cellulitis of right toe: Secondary | ICD-10-CM | POA: Diagnosis not present

## 2016-05-10 DIAGNOSIS — H26492 Other secondary cataract, left eye: Secondary | ICD-10-CM | POA: Diagnosis not present

## 2016-05-10 DIAGNOSIS — H26493 Other secondary cataract, bilateral: Secondary | ICD-10-CM | POA: Diagnosis not present

## 2016-05-10 DIAGNOSIS — H26491 Other secondary cataract, right eye: Secondary | ICD-10-CM | POA: Diagnosis not present

## 2016-05-17 DIAGNOSIS — G4733 Obstructive sleep apnea (adult) (pediatric): Secondary | ICD-10-CM | POA: Diagnosis not present

## 2016-06-11 ENCOUNTER — Other Ambulatory Visit: Payer: Self-pay | Admitting: Cardiovascular Disease

## 2016-06-17 DIAGNOSIS — G4733 Obstructive sleep apnea (adult) (pediatric): Secondary | ICD-10-CM | POA: Diagnosis not present

## 2016-07-17 DIAGNOSIS — G4733 Obstructive sleep apnea (adult) (pediatric): Secondary | ICD-10-CM | POA: Diagnosis not present

## 2016-07-21 DIAGNOSIS — H26493 Other secondary cataract, bilateral: Secondary | ICD-10-CM | POA: Diagnosis not present

## 2016-08-17 DIAGNOSIS — G4733 Obstructive sleep apnea (adult) (pediatric): Secondary | ICD-10-CM | POA: Diagnosis not present

## 2016-08-31 ENCOUNTER — Other Ambulatory Visit: Payer: Self-pay | Admitting: Cardiovascular Disease

## 2016-08-31 DIAGNOSIS — I200 Unstable angina: Secondary | ICD-10-CM

## 2016-08-31 DIAGNOSIS — R011 Cardiac murmur, unspecified: Secondary | ICD-10-CM

## 2016-08-31 DIAGNOSIS — I10 Essential (primary) hypertension: Secondary | ICD-10-CM

## 2016-09-07 ENCOUNTER — Encounter (HOSPITAL_COMMUNITY): Payer: Self-pay | Admitting: *Deleted

## 2016-09-07 ENCOUNTER — Emergency Department (HOSPITAL_COMMUNITY): Payer: Medicare Other

## 2016-09-07 ENCOUNTER — Observation Stay (HOSPITAL_COMMUNITY)
Admission: EM | Admit: 2016-09-07 | Discharge: 2016-09-09 | Disposition: A | Discharge status: Home / Self Care | Payer: Medicare Other | Attending: Cardiovascular Disease | Admitting: Cardiovascular Disease

## 2016-09-07 DIAGNOSIS — I25110 Atherosclerotic heart disease of native coronary artery with unstable angina pectoris: Principal | ICD-10-CM | POA: Insufficient documentation

## 2016-09-07 DIAGNOSIS — Z79899 Other long term (current) drug therapy: Secondary | ICD-10-CM | POA: Insufficient documentation

## 2016-09-07 DIAGNOSIS — R1310 Dysphagia, unspecified: Secondary | ICD-10-CM | POA: Insufficient documentation

## 2016-09-07 DIAGNOSIS — I200 Unstable angina: Secondary | ICD-10-CM | POA: Diagnosis present

## 2016-09-07 DIAGNOSIS — K219 Gastro-esophageal reflux disease without esophagitis: Secondary | ICD-10-CM | POA: Diagnosis not present

## 2016-09-07 DIAGNOSIS — N289 Disorder of kidney and ureter, unspecified: Secondary | ICD-10-CM | POA: Insufficient documentation

## 2016-09-07 DIAGNOSIS — Z955 Presence of coronary angioplasty implant and graft: Secondary | ICD-10-CM | POA: Insufficient documentation

## 2016-09-07 DIAGNOSIS — G4733 Obstructive sleep apnea (adult) (pediatric): Secondary | ICD-10-CM | POA: Insufficient documentation

## 2016-09-07 DIAGNOSIS — M549 Dorsalgia, unspecified: Secondary | ICD-10-CM | POA: Insufficient documentation

## 2016-09-07 DIAGNOSIS — E039 Hypothyroidism, unspecified: Secondary | ICD-10-CM | POA: Insufficient documentation

## 2016-09-07 DIAGNOSIS — Z8546 Personal history of malignant neoplasm of prostate: Secondary | ICD-10-CM | POA: Insufficient documentation

## 2016-09-07 DIAGNOSIS — Z9841 Cataract extraction status, right eye: Secondary | ICD-10-CM | POA: Insufficient documentation

## 2016-09-07 DIAGNOSIS — M1990 Unspecified osteoarthritis, unspecified site: Secondary | ICD-10-CM | POA: Insufficient documentation

## 2016-09-07 DIAGNOSIS — G8929 Other chronic pain: Secondary | ICD-10-CM | POA: Insufficient documentation

## 2016-09-07 DIAGNOSIS — I4510 Unspecified right bundle-branch block: Secondary | ICD-10-CM | POA: Insufficient documentation

## 2016-09-07 DIAGNOSIS — Z7902 Long term (current) use of antithrombotics/antiplatelets: Secondary | ICD-10-CM | POA: Diagnosis not present

## 2016-09-07 DIAGNOSIS — R079 Chest pain, unspecified: Secondary | ICD-10-CM | POA: Diagnosis present

## 2016-09-07 DIAGNOSIS — I350 Nonrheumatic aortic (valve) stenosis: Secondary | ICD-10-CM | POA: Insufficient documentation

## 2016-09-07 DIAGNOSIS — E785 Hyperlipidemia, unspecified: Secondary | ICD-10-CM | POA: Insufficient documentation

## 2016-09-07 DIAGNOSIS — Z9109 Other allergy status, other than to drugs and biological substances: Secondary | ICD-10-CM | POA: Insufficient documentation

## 2016-09-07 DIAGNOSIS — Z951 Presence of aortocoronary bypass graft: Secondary | ICD-10-CM | POA: Diagnosis not present

## 2016-09-07 DIAGNOSIS — Z9842 Cataract extraction status, left eye: Secondary | ICD-10-CM | POA: Diagnosis not present

## 2016-09-07 DIAGNOSIS — I10 Essential (primary) hypertension: Secondary | ICD-10-CM | POA: Diagnosis present

## 2016-09-07 DIAGNOSIS — Z87891 Personal history of nicotine dependence: Secondary | ICD-10-CM | POA: Diagnosis not present

## 2016-09-07 DIAGNOSIS — M109 Gout, unspecified: Secondary | ICD-10-CM | POA: Diagnosis not present

## 2016-09-07 DIAGNOSIS — Z885 Allergy status to narcotic agent status: Secondary | ICD-10-CM | POA: Insufficient documentation

## 2016-09-07 DIAGNOSIS — Z7982 Long term (current) use of aspirin: Secondary | ICD-10-CM | POA: Insufficient documentation

## 2016-09-07 DIAGNOSIS — I2510 Atherosclerotic heart disease of native coronary artery without angina pectoris: Secondary | ICD-10-CM | POA: Diagnosis present

## 2016-09-07 DIAGNOSIS — D649 Anemia, unspecified: Secondary | ICD-10-CM | POA: Diagnosis not present

## 2016-09-07 LAB — BASIC METABOLIC PANEL
Anion gap: 9 (ref 5–15)
BUN: 29 mg/dL — ABNORMAL HIGH (ref 6–20)
CALCIUM: 8.8 mg/dL — AB (ref 8.9–10.3)
CHLORIDE: 105 mmol/L (ref 101–111)
CO2: 24 mmol/L (ref 22–32)
CREATININE: 1.37 mg/dL — AB (ref 0.61–1.24)
GFR, EST AFRICAN AMERICAN: 57 mL/min — AB (ref >60)
GFR, EST NON AFRICAN AMERICAN: 50 mL/min — AB (ref >60)
Glucose, Bld: 165 mg/dL — ABNORMAL HIGH (ref 65–99)
Potassium: 3.7 mmol/L (ref 3.5–5.1)
SODIUM: 138 mmol/L (ref 135–145)

## 2016-09-07 LAB — CBC
HCT: 32.4 % — ABNORMAL LOW (ref 39.0–52.0)
Hemoglobin: 11.3 g/dL — ABNORMAL LOW (ref 13.0–17.0)
MCH: 33.8 pg (ref 26.0–34.0)
MCHC: 34.9 g/dL (ref 30.0–36.0)
MCV: 97 fL (ref 78.0–100.0)
PLATELETS: 160 10*3/uL (ref 150–400)
RBC: 3.34 MIL/uL — AB (ref 4.22–5.81)
RDW: 13.3 % (ref 11.5–15.5)
WBC: 5.9 10*3/uL (ref 4.0–10.5)

## 2016-09-07 LAB — TROPONIN I

## 2016-09-07 MED ORDER — NITROGLYCERIN 2 % TD OINT
1.0000 [in_us] | TOPICAL_OINTMENT | Freq: Once | TRANSDERMAL | Status: AC
  Administered 2016-09-07: 1 [in_us] via TOPICAL
  Filled 2016-09-07: qty 1

## 2016-09-07 MED ORDER — ACETAMINOPHEN 325 MG PO TABS
650.0000 mg | ORAL_TABLET | Freq: Once | ORAL | Status: AC
  Administered 2016-09-07: 650 mg via ORAL
  Filled 2016-09-07: qty 2

## 2016-09-07 MED ORDER — ASPIRIN 81 MG PO CHEW
324.0000 mg | CHEWABLE_TABLET | Freq: Once | ORAL | Status: AC
  Administered 2016-09-07: 324 mg via ORAL
  Filled 2016-09-07: qty 4

## 2016-09-07 NOTE — ED Triage Notes (Signed)
Pt c/o intermittent chest pain described as being dull with sob and weakness that occurred Sunday, was relief with nitro and then happened again tonight, pt did take two nitro prior to arrival in er with decrease in pain

## 2016-09-07 NOTE — ED Provider Notes (Signed)
[facility] DEPT Provider Note   CSN: [id number] Arrival date & time: 09/07/16  2013     History   Chief Complaint Chief Complaint  Patient presents with  . Chest Pain    HPI [name] is a 71 y.o. male.  HPI Patient with extensive cardiac history presents with central chest tightness starting Sunday while walking. States he took a nitroglycerin that point with complete resolution of his symptoms. Has had persistent dyspnea since that time. 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 States he took 2 nitroglycerin with some improvement of symptoms. Currently ranks discomfort 5/10. no fever or chills. No radiation of pain. No new lower extremity swelling or asymmetry. Past Medical History:  Diagnosis Date  . Anginal pain ([place])   . Arthritis    "all over"  . Chronic lower back pain   . Coronary artery disease    a. s/p CABG x 2 (LIMA->LAD, RIMA->RCA);  b. s/p multiple PCI's to Ramus;  c. 08/2011 Cath/PCI: LM 70% into ramus with 70-80% there->treated wtih 3.5x18 Xience Xpedition DES, LCX  nonobs, RCA occluded.  RIMA & LIMA patent, EF 55-65%  . DJD (degenerative joint disease)   . GERD (gastroesophageal reflux disease)   . History of gout   . Hyperlipidemia   . Hypertension   . Hypothyroidism   . Neuropathy   . OSA on CPAP   . Prostate cancer [facility])     Patient Active Problem List   Diagnosis Date Noted  . Dysphagia 09/22/2015  . Exertional angina ([place]) 04/10/2015  . Abnormal nuclear cardiac imaging test 04/08/2015  . Aortic stenosis 09/24/2011  . Unstable angina ([place]) 09/09/2011  . Murmur 04/19/2011  . Coronary artery disease 12/08/2010  . HYPOTHYROIDISM 09/28/2008  . HYPERLIPIDEMIA 09/28/2008  . HYPERTENSION, UNSPECIFIED 09/28/2008  . CAD, ARTERY BYPASS GRAFT 09/28/2008  . DEGENERATIVE JOINT DISEASE 09/28/2008  . BACK PAIN, CHRONIC 09/28/2008    Past Surgical History:    Procedure Laterality Date  . ABDOMINAL HERNIA REPAIR    . BIOPSY  11/26/2015   Procedure: BIOPSY;  Surgeon: Rogene Houston, MD;  Location: [facility] ENDO [facility];  Service: Endoscopy;;  gastric esophagus  . CARDIAC CATHETERIZATION N/A 04/10/2015   Procedure: Left Heart Cath and Cors/Grafts Angiography;  Surgeon: Sherren Mocha, MD;  Location: [place] CV LAB;  Service: Cardiovascular;  Laterality: N/A;  . CARDIAC CATHETERIZATION N/A 04/10/2015   Procedure: Coronary Stent Intervention;  Surgeon: Sherren Mocha, MD;  Location: [place] CV LAB;  Service: Cardiovascular;  Laterality: N/A;  . CATARACT EXTRACTION W/PHACO Right 06/23/2015   Procedure: CATARACT EXTRACTION PHACO AND INTRAOCULAR LENS PLACEMENT RIGHT EYE; CDE:  7.08;  Surgeon: Tonny Branch, MD;  Location: [facility] ORS;  Service: Ophthalmology;  Laterality: Right;  . CATARACT EXTRACTION W/PHACO Left 08/25/2015   Procedure: CATARACT EXTRACTION PHACO AND INTRAOCULAR LENS PLACEMENT LEFT EYE; CDE:  8.18;  Surgeon: Tonny Branch, MD;  Location: [facility] ORS;  Service: Ophthalmology;  Laterality: Left;  . COLONOSCOPY N/A 12/28/2012   Procedure: COLONOSCOPY;  Surgeon: Rogene Houston, MD;  Location[facility];  Service: Endoscopy;  Laterality: N/A;  830 rescheduled  . CORONARY ANGIOPLASTY WITH STENT PLACEMENT  2013; 04/10/2015   "this makes me a total of 7" (04/10/2015)  . CORONARY ARTERY BYPASS GRAFT  1997   with (LIMA)  . ESOPHAGEAL DILATION N/A 11/26/2015   Procedure: ESOPHAGEAL DILATION;  Surgeon: Rogene Houston, MD;  Location: [facility]  END[facility];  Service: Endoscopy;  Laterality: N/A;  . ESOPHAGOGASTRODUODENOSCOPY N/A 11/26/2015   Procedure: ESOPHAGOGASTRODUODENOSCOPY (EGD);  Surgeon: Rogene Houston, MD;  [facility];  Service: Endoscopy;  Laterality: N/A;  1:25  . HERNIA REPAIR Left   . KNEE CARTILAGE SURGERY Bilateral   . LEFT HEART CATHETERIZATION WITH CORONARY ANGIOGRAM N/A 09/08/2011   Procedure: LEFT HEART CATHETERIZATION WITH CORONARY  ANGIOGRAM;  Surgeon: Sherren Mocha, MD;  Location: [facility] CATH LAB;  Service: Cardiovascular;  Laterality: N/A;  . PERCUTANEOUS CORONARY STENT INTERVENTION (PCI-S) Right 09/08/2011   Procedure: PERCUTANEOUS CORONARY STENT INTERVENTION (PCI-S);  Surgeon: Sherren Mocha, MD;  Location: [facility] CATH LAB;  Service: Cardiovascular;  Laterality: Right;  . ROBOT ASSISTED LAPAROSCOPIC RADICAL PROSTATECTOMY         Home Medications    Prior to Admission medications   Medication Sig Start Date End Date Taking? Authorizing Provider  amLODipine (NORVASC) 5 MG tablet TAKE 1 TABLET BY MOUTH EVERY DAY 06/11/16   Sherren Mocha, MD  aspirin 81 MG tablet Take 1 tablet (81 mg total) by mouth at bedtime. 11/28/15   Rehman, Mechele Dawley, MD  atorvastatin (LIPITOR) 40 MG tablet TAKE 1 TABLET(40 MG) BY MOUTH DAILY AT 6 PM 03/10/16   Bhagat, Bhavinkumar, PA  B Complex-C (SUPER B COMPLEX PO) Take 1 tablet by mouth daily.    [provider]  bismuth-metronidazole-tetracycline [facility]) [phone] MG capsule Take 3 capsules by mouth 4 (four) times daily -  before meals and at bedtime. 11/29/15   Rogene Houston, MD  celecoxib (CELEBREX) 200 MG capsule Take 1 capsule (200 mg total) by mouth daily. 01/26/12   Hillary Bow, MD  Cholecalciferol (VITAMIN D PO) Take 1,000 Units by mouth daily.     [provider]  clopidogrel (PLAVIX) 75 MG tablet Take 1 tablet (75 mg total) by mouth daily. 11/28/15   Rogene Houston, MD  Coenzyme Q10 (COQ10) 200 MG CAPS Take 200 mg by mouth daily.     [provider]  cyanocobalamin (,VITAMIN B-12,) 1000 MCG/ML injection Inject 1,000 mcg into the muscle every 30 (thirty) days.  03/25/15   [provider]  gabapentin (NEURONTIN) 600 MG tablet Take 600 mg by mouth 3 (three) times daily. 02/27/15   [provider]  glucosamine-chondroitin 500-400 MG tablet Take 1 tablet by mouth daily.    [provider]  isosorbide mononitrate (IMDUR) 30 MG 24 hr  tablet TAKE 1 TABLET(30 MG) BY MOUTH DAILY 03/31/16   Sherren Mocha, MD  levothyroxine (SYNTHROID, LEVOTHROID) 150 MCG tablet Take 150 mcg by mouth daily before breakfast.    [provider]  lisinopril (PRINIVIL,ZESTRIL) 5 MG tablet TAKE 1 TABLET BY MOUTH EVERY NIGHT AT BEDTIME    Sherren Mocha, MD  metoprolol tartrate (LOPRESSOR) 25 MG tablet Take 25 mg by mouth daily.     [provider]  Multiple Vitamin (MULTIVITAMIN) tablet Take 1 tablet by mouth every other day.     [provider]  nitroGLYCERIN (NITROSTAT) 0.4 MG SL tablet PLACE 1 TABLET UNDER THE TONGUE EVERY 5 MINUTES AS NEEDED FOR CHEST PAIN 09/01/16   Sherren Mocha, MD  Omega-3 Fatty Acids (FISH OIL) 1200 MG CAPS Take 2 capsules by mouth 2 (two) times daily.     [provider]  pantoprazole (PROTONIX) 40 MG tablet Take 40 mg by mouth 2 (two) times daily.    [provider]  traMADol (ULTRAM) 50 MG tablet Take 50 mg by mouth every 8 (  eight) hours as needed for moderate pain.  12/24/13   [provider]    Family History Family History  Problem Relation Age of Onset  . Cancer Father 63       died  . Stroke Mother 29       died    Social History Social History  Substance Use Topics  . Smoking status: Former Smoker    Years: 12.00    Types: Cigars  . Smokeless tobacco: Never Used     Comment: "quit smoking in ~ 1978"  . Alcohol use Yes     Comment: 04/10/2015 "I've drank 4 beers in the last 2 months"     Allergies   Codeine and Pollen extract   Review of Systems Review of Systems  Constitutional: Negative for chills and fever.  Respiratory: Positive for chest tightness and shortness of breath. Negative for cough.   Cardiovascular: Positive for chest pain. Negative for palpitations and leg swelling.  Gastrointestinal: Negative for abdominal pain, constipation, diarrhea, nausea and vomiting.  Genitourinary: Negative for dysuria, flank pain and frequency.    Musculoskeletal: Negative for back pain, myalgias, neck pain and neck stiffness.  Skin: Negative for rash and wound.  Neurological: Positive for headaches. Negative for dizziness, weakness, light-headedness and numbness.  All other systems reviewed and are negative.    Physical Exam Updated Vital Signs BP (!) 151/99   Pulse 72   Temp 98.4 F (36.9 C) (Oral)   Resp (!) 21   Ht 6' (1.829 m)   Wt 96.6 kg (213 lb)   SpO2 94%   BMI 28.89 kg/m   Physical Exam  Constitutional: He is oriented to person, place, and time. He appears well-developed and well-nourished. No distress.  HENT:  Head: Normocephalic and atraumatic.  Mouth/Throat: Oropharynx is clear and moist. No oropharyngeal exudate.  Eyes: Pupils are equal, round, and reactive to light. EOM are normal.  Neck: Normal range of motion. Neck supple. No JVD present.  Cardiovascular: Normal rate and regular rhythm.  Exam reveals no gallop and no friction rub.   Murmur heard. Pulmonary/Chest: Effort normal and breath sounds normal. No respiratory distress. He has no wheezes. He has no rales. He exhibits no tenderness.  Abdominal: Soft. Bowel sounds are normal. There is no tenderness. There is no rebound and no guarding.  Musculoskeletal: Normal range of motion. He exhibits no edema or tenderness.  No lower extremity swelling, tenderness or asymmetry. Pulses are 2+.  Neurological: He is alert and oriented to person, place, and time.  Moves all extremities without focal deficit. Sensation fully intact.  Skin: Skin is warm and dry. Capillary refill takes less than 2 seconds. No rash noted. No erythema.  Psychiatric: He has a normal mood and affect. His behavior is normal.  Nursing note and vitals reviewed.    ED Treatments / Results  Labs (all labs ordered are listed, but only abnormal results are displayed) Labs Reviewed  BASIC METABOLIC PANEL - Abnormal; Notable for the following:       Result Value   Glucose, Bld 165 (*)     BUN 29 (*)    Creatinine, Ser 1.37 (*)    Calcium 8.8 (*)    GFR calc non Af Amer 50 (*)    GFR calc Af Amer 57 (*)    All other components within normal limits  CBC - Abnormal; Notable for the following:    RBC 3.34 (*)    Hemoglobin 11.3 (*)    HCT  32.4 (*)    All other components within normal limits  TROPONIN I    EKG  EKG Interpretation None       Radiology Dg Chest Portable 1 View  Result Date: 09/07/2016 CLINICAL DATA:  Patient states mid chest pain/ tightness and SOB on Sunday that eased off but has come back today. Hx of coronary artery disease, HTN, CABG, coronary stent placement, heart cath, and former smoker. EXAM: PORTABLE CHEST 1 VIEW COMPARISON:  10/30/2013 FINDINGS: There stable changes from previous CABG surgery. Left coronary artery stent is also stable. The cardiac silhouette is normal in size. No mediastinal or hilar masses or evidence of adenopathy. Clear lungs.  No evidence of a pleural effusion.  No pneumothorax. Skeletal structures are intact. IMPRESSION: No acute cardiopulmonary disease. Electronically Signed   By: Lajean Manes M.D.   On: 09/07/2016 20:49    Procedures Procedures (including critical care time)  Medications Ordered in ED Medications  aspirin chewable tablet 324 mg (324 mg Oral Given 09/07/16 2047)  nitroGLYCERIN (NITROGLYN) 2 % ointment 1 inch (1 inch Topical Given 09/07/16 2048)  acetaminophen (TYLENOL) tablet 650 mg (650 mg Oral Given 09/07/16 2048)     Initial Impression / Assessment and Plan / ED Course  I have reviewed the triage vital signs and the nursing notes.  Pertinent labs & imaging results that were available during my care of the patient were reviewed by me and considered in my medical decision making (see chart for details).     Patient states his chest pain has improved after nitroglycerin patch. Now 1-2/10. Initial troponin is normal.  Discussed with cardiology fellow who recommends possible admission and  trending of troponins. Cardiology will see in the morning. Discussed with hospitalist who will see patient in the emergency department and admit. Final Clinical Impressions(s) / ED Diagnoses   Final diagnoses:  Unstable angina [facility])    New Prescriptions New Prescriptions   No medications on file     Julianne Rice, MD 09/07/16 2205

## 2016-09-08 ENCOUNTER — Encounter (HOSPITAL_COMMUNITY)
Admission: EM | Disposition: A | Discharge status: Home / Self Care | Payer: Self-pay | Source: Home / Self Care | Attending: Emergency Medicine

## 2016-09-08 ENCOUNTER — Encounter (HOSPITAL_COMMUNITY): Payer: Self-pay

## 2016-09-08 DIAGNOSIS — I10 Essential (primary) hypertension: Secondary | ICD-10-CM | POA: Diagnosis not present

## 2016-09-08 DIAGNOSIS — I25110 Atherosclerotic heart disease of native coronary artery with unstable angina pectoris: Secondary | ICD-10-CM

## 2016-09-08 DIAGNOSIS — I25119 Atherosclerotic heart disease of native coronary artery with unspecified angina pectoris: Secondary | ICD-10-CM | POA: Diagnosis not present

## 2016-09-08 DIAGNOSIS — E782 Mixed hyperlipidemia: Secondary | ICD-10-CM | POA: Diagnosis not present

## 2016-09-08 DIAGNOSIS — R079 Chest pain, unspecified: Secondary | ICD-10-CM | POA: Diagnosis present

## 2016-09-08 DIAGNOSIS — E039 Hypothyroidism, unspecified: Secondary | ICD-10-CM

## 2016-09-08 HISTORY — PX: LEFT HEART CATH AND CORS/GRAFTS ANGIOGRAPHY: CATH118250

## 2016-09-08 LAB — CBC
HEMATOCRIT: 31.1 % — AB (ref 39.0–52.0)
HEMATOCRIT: 36.4 % — AB (ref 39.0–52.0)
HEMOGLOBIN: 10.6 g/dL — AB (ref 13.0–17.0)
HEMOGLOBIN: 12.4 g/dL — AB (ref 13.0–17.0)
MCH: 33.1 pg (ref 26.0–34.0)
MCH: 32.8 pg (ref 26.0–34.0)
MCHC: 34.1 g/dL (ref 30.0–36.0)
MCHC: 34.1 g/dL (ref 30.0–36.0)
MCV: 97.2 fL (ref 78.0–100.0)
MCV: 96.3 fL (ref 78.0–100.0)
Platelets: 150 10*3/uL (ref 150–400)
Platelets: 147 10*3/uL — ABNORMAL LOW (ref 150–400)
RBC: 3.2 MIL/uL — AB (ref 4.22–5.81)
RBC: 3.78 MIL/uL — ABNORMAL LOW (ref 4.22–5.81)
RDW: 13.2 % (ref 11.5–15.5)
RDW: 13.7 % (ref 11.5–15.5)
WBC: 5 10*3/uL (ref 4.0–10.5)
WBC: 5.9 10*3/uL (ref 4.0–10.5)

## 2016-09-08 LAB — COMPREHENSIVE METABOLIC PANEL
ALBUMIN: 3.6 g/dL (ref 3.5–5.0)
ALK PHOS: 57 U/L (ref 38–126)
ALT: 18 U/L (ref 17–63)
AST: 21 U/L (ref 15–41)
Anion gap: 7 (ref 5–15)
BILIRUBIN TOTAL: 0.4 mg/dL (ref 0.3–1.2)
BUN: 24 mg/dL — AB (ref 6–20)
CALCIUM: 8.4 mg/dL — AB (ref 8.9–10.3)
CO2: 27 mmol/L (ref 22–32)
Chloride: 105 mmol/L (ref 101–111)
Creatinine, Ser: 1.1 mg/dL (ref 0.61–1.24)
GFR calc Af Amer: >60 mL/min (ref >60)
GFR calc non Af Amer: >60 mL/min (ref >60)
GLUCOSE: 105 mg/dL — AB (ref 65–99)
POTASSIUM: 3.5 mmol/L (ref 3.5–5.1)
SODIUM: 139 mmol/L (ref 135–145)
TOTAL PROTEIN: 6.2 g/dL — AB (ref 6.5–8.1)

## 2016-09-08 LAB — TROPONIN I: Troponin I: <0.03 ng/mL (ref <0.03)

## 2016-09-08 LAB — CREATININE, SERUM
Creatinine, Ser: 1.08 mg/dL (ref 0.61–1.24)
GFR calc Af Amer: >60 mL/min (ref >60)
GFR calc non Af Amer: >60 mL/min (ref >60)

## 2016-09-08 LAB — PROTIME-INR
INR: 1.02
PROTHROMBIN TIME: 13.3 s (ref 11.4–15.2)

## 2016-09-08 SURGERY — LEFT HEART CATH AND CORS/GRAFTS ANGIOGRAPHY
Anesthesia: LOCAL

## 2016-09-08 MED ORDER — SODIUM CHLORIDE 0.9% FLUSH: 3.0000 mL | INTRAVENOUS | Status: DC | PRN

## 2016-09-08 MED ORDER — FENTANYL CITRATE (PF) 100 MCG/2ML IJ SOLN
INTRAMUSCULAR | Status: DC | PRN
  Administered 2016-09-08: 25 ug via INTRAVENOUS

## 2016-09-08 MED ORDER — HEPARIN SODIUM (PORCINE) 5000 UNIT/ML IJ SOLN
5000.0000 [IU] | Freq: Three times a day (TID) | INTRAMUSCULAR | Status: DC
  Administered 2016-09-09: 5000 [IU] via SUBCUTANEOUS
  Filled 2016-09-08: qty 1

## 2016-09-08 MED ORDER — ACETAMINOPHEN 650 MG RE SUPP: 650.0000 mg | Freq: Four times a day (QID) | RECTAL | Status: DC | PRN

## 2016-09-08 MED ORDER — HEPARIN (PORCINE) IN NACL 2-0.9 UNIT/ML-% IJ SOLN
INTRAMUSCULAR | Status: AC
  Filled 2016-09-08: qty 1000

## 2016-09-08 MED ORDER — IOPAMIDOL (ISOVUE-370) INJECTION 76%
INTRAVENOUS | Status: AC
  Filled 2016-09-08: qty 125

## 2016-09-08 MED ORDER — SODIUM CHLORIDE 0.9 % IV SOLN: 250.0000 mL | INTRAVENOUS | Status: DC | PRN

## 2016-09-08 MED ORDER — NIACIN 500 MG PO TABS
500.0000 mg | ORAL_TABLET | Freq: Every evening | ORAL | Status: DC
  Filled 2016-09-08 (×2): qty 1

## 2016-09-08 MED ORDER — SODIUM CHLORIDE 0.9 % WEIGHT BASED INFUSION: 1.0000 mL/kg/h | INTRAVENOUS | Status: AC

## 2016-09-08 MED ORDER — HEPARIN (PORCINE) IN NACL 100-0.45 UNIT/ML-% IJ SOLN
1350.0000 [IU]/h | INTRAMUSCULAR | Status: DC
  Administered 2016-09-08: 1350 [IU]/h via INTRAVENOUS
  Filled 2016-09-08: qty 250

## 2016-09-08 MED ORDER — POTASSIUM CHLORIDE CRYS ER 20 MEQ PO TBCR: 40.0000 meq | EXTENDED_RELEASE_TABLET | Freq: Once | ORAL | Status: DC

## 2016-09-08 MED ORDER — VERAPAMIL HCL 2.5 MG/ML IV SOLN
INTRAVENOUS | Status: DC | PRN
  Administered 2016-09-08: 8 mL via INTRA_ARTERIAL

## 2016-09-08 MED ORDER — LISINOPRIL 5 MG PO TABS
5.0000 mg | ORAL_TABLET | Freq: Every day | ORAL | Status: DC
  Administered 2016-09-08 (×2): 5 mg via ORAL
  Filled 2016-09-08 (×2): qty 1

## 2016-09-08 MED ORDER — ONDANSETRON HCL 4 MG PO TABS: 4.0000 mg | ORAL_TABLET | Freq: Four times a day (QID) | ORAL | Status: DC | PRN

## 2016-09-08 MED ORDER — PANTOPRAZOLE SODIUM 40 MG PO TBEC
40.0000 mg | DELAYED_RELEASE_TABLET | Freq: Two times a day (BID) | ORAL | Status: DC
  Administered 2016-09-08 – 2016-09-09 (×3): 40 mg via ORAL
  Filled 2016-09-08 (×3): qty 1

## 2016-09-08 MED ORDER — GABAPENTIN 300 MG PO CAPS
600.0000 mg | ORAL_CAPSULE | Freq: Three times a day (TID) | ORAL | Status: DC
  Administered 2016-09-08 – 2016-09-09 (×5): 600 mg via ORAL
  Filled 2016-09-08 (×5): qty 2

## 2016-09-08 MED ORDER — ASPIRIN 81 MG PO CHEW
CHEWABLE_TABLET | ORAL | Status: AC
  Filled 2016-09-08: qty 1

## 2016-09-08 MED ORDER — HEPARIN SODIUM (PORCINE) 1000 UNIT/ML IJ SOLN
INTRAMUSCULAR | Status: DC | PRN
  Administered 2016-09-08: 5000 [IU] via INTRAVENOUS

## 2016-09-08 MED ORDER — NITROGLYCERIN 2 % TD OINT
0.5000 [in_us] | TOPICAL_OINTMENT | Freq: Four times a day (QID) | TRANSDERMAL | Status: DC
  Administered 2016-09-08: 0.5 [in_us] via TOPICAL
  Filled 2016-09-08 (×2): qty 1

## 2016-09-08 MED ORDER — TRAMADOL HCL 50 MG PO TABS
50.0000 mg | ORAL_TABLET | Freq: Three times a day (TID) | ORAL | Status: DC
  Administered 2016-09-08 – 2016-09-09 (×6): 50 mg via ORAL
  Filled 2016-09-08 (×6): qty 1

## 2016-09-08 MED ORDER — SODIUM CHLORIDE 0.9 % IV SOLN: INTRAVENOUS | Status: DC

## 2016-09-08 MED ORDER — VERAPAMIL HCL 2.5 MG/ML IV SOLN
INTRAVENOUS | Status: AC
  Filled 2016-09-08: qty 2

## 2016-09-08 MED ORDER — ONDANSETRON HCL 4 MG/2ML IJ SOLN
4.0000 mg | Freq: Four times a day (QID) | INTRAMUSCULAR | Status: DC | PRN
Start: 2016-09-08 — End: 2016-09-09

## 2016-09-08 MED ORDER — ADULT MULTIVITAMIN W/MINERALS CH
1.0000 | ORAL_TABLET | ORAL | Status: DC
  Administered 2016-09-08: 1 via ORAL
  Filled 2016-09-08 (×2): qty 1

## 2016-09-08 MED ORDER — ENOXAPARIN SODIUM 40 MG/0.4ML ~~LOC~~ SOLN
40.0000 mg | SUBCUTANEOUS | Status: DC
  Administered 2016-09-08: 40 mg via SUBCUTANEOUS
  Filled 2016-09-08: qty 0.4

## 2016-09-08 MED ORDER — CLOPIDOGREL BISULFATE 75 MG PO TABS
75.0000 mg | ORAL_TABLET | Freq: Every morning | ORAL | Status: DC
  Administered 2016-09-08 – 2016-09-09 (×2): 75 mg via ORAL
  Filled 2016-09-08 (×2): qty 1

## 2016-09-08 MED ORDER — SODIUM CHLORIDE 0.9% FLUSH
3.0000 mL | Freq: Two times a day (BID) | INTRAVENOUS | Status: DC
Start: 2016-09-08 — End: 2016-09-08

## 2016-09-08 MED ORDER — MIDAZOLAM HCL 2 MG/2ML IJ SOLN
INTRAMUSCULAR | Status: DC | PRN
  Administered 2016-09-08: 1 mg via INTRAVENOUS

## 2016-09-08 MED ORDER — SODIUM CHLORIDE 0.9% FLUSH: 3.0000 mL | Freq: Two times a day (BID) | INTRAVENOUS | Status: DC

## 2016-09-08 MED ORDER — ACETAMINOPHEN 325 MG PO TABS: 650.0000 mg | ORAL_TABLET | Freq: Four times a day (QID) | ORAL | Status: DC | PRN

## 2016-09-08 MED ORDER — MIDAZOLAM HCL 2 MG/2ML IJ SOLN
INTRAMUSCULAR | Status: AC
  Filled 2016-09-08: qty 2

## 2016-09-08 MED ORDER — SODIUM CHLORIDE 0.9 % WEIGHT BASED INFUSION: 3.0000 mL/kg/h | INTRAVENOUS | Status: DC

## 2016-09-08 MED ORDER — FENTANYL CITRATE (PF) 100 MCG/2ML IJ SOLN
INTRAMUSCULAR | Status: AC
  Filled 2016-09-08: qty 2

## 2016-09-08 MED ORDER — LEVOTHYROXINE SODIUM 75 MCG PO TABS
150.0000 ug | ORAL_TABLET | Freq: Every day | ORAL | Status: DC
  Administered 2016-09-08 – 2016-09-09 (×2): 150 ug via ORAL
  Filled 2016-09-08 (×2): qty 2

## 2016-09-08 MED ORDER — HEPARIN BOLUS VIA INFUSION
4000.0000 [IU] | Freq: Once | INTRAVENOUS | Status: AC
  Administered 2016-09-08: 4000 [IU] via INTRAVENOUS
  Filled 2016-09-08: qty 4000

## 2016-09-08 MED ORDER — IOPAMIDOL (ISOVUE-370) INJECTION 76%
INTRAVENOUS | Status: DC | PRN
  Administered 2016-09-08: 115 mL via INTRA_ARTERIAL

## 2016-09-08 MED ORDER — SODIUM CHLORIDE 0.9 % WEIGHT BASED INFUSION: 1.0000 mL/kg/h | INTRAVENOUS | Status: DC

## 2016-09-08 MED ORDER — METOPROLOL TARTRATE 25 MG PO TABS
25.0000 mg | ORAL_TABLET | Freq: Every day | ORAL | Status: DC
  Administered 2016-09-08 – 2016-09-09 (×2): 25 mg via ORAL
  Filled 2016-09-08 (×2): qty 1

## 2016-09-08 MED ORDER — ATORVASTATIN CALCIUM 40 MG PO TABS
40.0000 mg | ORAL_TABLET | Freq: Every day | ORAL | Status: DC
  Administered 2016-09-08: 40 mg via ORAL
  Filled 2016-09-08: qty 1

## 2016-09-08 MED ORDER — ALUM & MAG HYDROXIDE-SIMETH 200-200-20 MG/5ML PO SUSP
30.0000 mL | Freq: Once | ORAL | Status: AC
  Administered 2016-09-08: 30 mL via ORAL
  Filled 2016-09-08: qty 30

## 2016-09-08 MED ORDER — AMLODIPINE BESYLATE 5 MG PO TABS
5.0000 mg | ORAL_TABLET | Freq: Every day | ORAL | Status: DC
  Administered 2016-09-08 – 2016-09-09 (×2): 5 mg via ORAL
  Filled 2016-09-08 (×2): qty 1

## 2016-09-08 MED ORDER — ASPIRIN EC 81 MG PO TBEC: 81.0000 mg | DELAYED_RELEASE_TABLET | Freq: Every day | ORAL | Status: DC

## 2016-09-08 MED ORDER — HEPARIN SODIUM (PORCINE) 1000 UNIT/ML IJ SOLN
INTRAMUSCULAR | Status: AC
  Filled 2016-09-08: qty 1

## 2016-09-08 MED ORDER — LIDOCAINE HCL (PF) 1 % IJ SOLN
INTRAMUSCULAR | Status: DC | PRN
  Administered 2016-09-08: 2 mL

## 2016-09-08 MED ORDER — LIDOCAINE HCL (PF) 1 % IJ SOLN
INTRAMUSCULAR | Status: AC
  Filled 2016-09-08: qty 30

## 2016-09-08 MED ORDER — ASPIRIN 81 MG PO CHEW
81.0000 mg | CHEWABLE_TABLET | ORAL | Status: AC
  Administered 2016-09-08: 81 mg via ORAL

## 2016-09-08 MED ORDER — HEPARIN (PORCINE) IN NACL 2-0.9 UNIT/ML-% IJ SOLN
INTRAMUSCULAR | Status: AC | PRN
  Administered 2016-09-08: 1000 mL

## 2016-09-08 SURGICAL SUPPLY — 15 items
CATH 5FR JL3.5 JR4 ANG PIG MP (CATHETERS) ×2 IMPLANT
CATH SUPER TORQUE PLUS 6F MPA1 (CATHETERS) ×2 IMPLANT
DEVICE RAD COMP TR BAND LRG (VASCULAR PRODUCTS) ×2 IMPLANT
GLIDESHEATH SLEND SS 6F .021 (SHEATH) ×2 IMPLANT
GUIDEWIRE INQWIRE 1.5J.035X260 (WIRE) ×1 IMPLANT
INQWIRE 1.5J .035X260CM (WIRE) ×2
KIT HEART LEFT (KITS) ×2 IMPLANT
PACK CARDIAC CATHETERIZATION (CUSTOM PROCEDURE TRAY) ×2 IMPLANT
PROTECTION STATION PRESSURIZED (MISCELLANEOUS) ×2
STATION PROTECTION PRESSURIZED (MISCELLANEOUS) ×1 IMPLANT
SYR MEDRAD MARK V 150ML (SYRINGE) ×2 IMPLANT
TRANSDUCER W/STOPCOCK (MISCELLANEOUS) ×2 IMPLANT
TUBING ART PRESS 72  MALE/FEM (TUBING) ×1
TUBING ART PRESS 72 MALE/FEM (TUBING) ×1 IMPLANT
TUBING CIL FLEX 10 FLL-RA (TUBING) ×2 IMPLANT

## 2016-09-08 NOTE — Progress Notes (Signed)
  PROGRESS NOTE  [name] [facility] RXY:[id number] DOB: July 20, 1945 DOA: 09/07/2016 PCP: Sharilyn Sites, MD  Assessment/Plan Unstable angina, CAD, CABG, DES stent 2017. -Troponins negative. Agree with transfer to [facility] for cath. Continue heparin, Plavix, ASA, atorvastatin, metoprolol.  Essential HTN -Stable. Continue metoprolol.   TRH will sign off. Please call if we can be of any assistance [phone])  DVT prophylaxis: heparin Code Status: full Family Communication: none Disposition Plan: [facility]    Murray Hodgkins, MD  [facility] Direct contact: [phone] --Via amion app OR  --[email or web address]; password [email or web address]  7PM-7AM contact night coverage as above 09/08/2016, 10:43 AM  LOS: 0 days   Consultants:  Cardiology   Procedures:    Antimicrobials:    Interval history/Subjective: Feels ok, mild SOB, mild chest discomfort.  Objective: Vitals: afebrile, 97, 18, 53, 154/62, 98%  Exam:     Constitutional: appears calm and comfortable  CV RRR no m/r/g. No LE edema.   Resp. CTA bilaterally, no w/r/r. Normal resp effort.  Psych. Speech fluent and clear.   I have personally reviewed the following:   Labs:  Basic metabolic panel unremarkable. LFTs unremarkable. Troponins negative.  Hemoglobin stable, 10.6.  Imaging studies:  Chest x-ray no acute disease, independently reviewed  Medical tests:  EKG sinus rhythm   Scheduled Meds: . amLODipine  5 mg Oral Daily  . aspirin EC  81 mg Oral QHS  . atorvastatin  40 mg Oral q1800  . clopidogrel  75 mg Oral q morning - 10a  . gabapentin  600 mg Oral TID  . levothyroxine  150 mcg Oral QAC breakfast  . lisinopril  5 mg Oral QHS  . metoprolol tartrate  25 mg Oral Daily  . multivitamin with minerals  1 tablet Oral QODAY  . niacin  500 mg Oral QPM  . nitroGLYCERIN  0.5 inch Topical Q6H  . pantoprazole  40 mg Oral BID  . traMADol  50 mg Oral Q8H   Continuous Infusions: . sodium chloride    . heparin 1,350  Units/hr (09/08/16 1042)    Principal Problem:   Unstable angina (HCC) Active Problems:   Essential hypertension   Coronary artery disease   Chest pain   LOS: 0 days

## 2016-09-08 NOTE — H&P (View-Only) (Signed)
Cardiology Consultation:   Patient ID: [name]; [id number]; [birth year]   Admit date: 09/07/2016 Date of Consult: 09/08/2016  Primary Care Provider: Sharilyn Sites, MD Primary Cardiologist: Dr. Sherren Mocha   Patient Profile:   [name] is a 71 y.o. male with a hx of CAD who is being seen today for the evaluation of chest pain at the request of Dr. Sarajane Jews.  History of Present Illness:   [name] has a history of CAD and underwent 2v CABG with LIMA to LAD and RIMA to RCA 1997, hypertension, hyperlipidemia, hypothyroidism and chronic back pain. Underwent PCI in March 2017 after presenting with exertional angina and an abnormal nuclear scan. Treated with overlapping DES in the distal RCA through the graft to the RCA. History of GERD with dysphagia s/p esophageal dilatation 11/2015, anemia with B12 shots.  Patient had exertional chest tightness while walking last week and again Sunday night eased with rest. Yest walked on treadmill at gym and became short of breath so stopped. 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 Developed chest tightness relieved with 2 NTG but returned quickly while at rest so came to ED relieved with NTG paste. Similar to prior angina. Has done well since last stent and last saw Dr. Burt Knack 10/2015. Also complains of increased gas and belching last night. Troponins negative x 3 EKG without acute change.  Past Medical History:  Diagnosis Date  . Anginal pain ([place])   . Arthritis    "all over"  . Chronic lower back pain   . Coronary artery disease    a. s/p CABG x 2 (LIMA->LAD, RIMA->RCA);  b. s/p multiple PCI's to Ramus;  c. 08/2011 Cath/PCI: LM 70% into ramus with 70-80% there->treated wtih 3.5x18 Xience Xpedition DES, LCX  nonobs, RCA occluded.  RIMA & LIMA patent, EF 55-65%  . DJD (degenerative joint disease)   . GERD (gastroesophageal reflux disease)   . History of gout   . Hyperlipidemia   . Hypertension   .  Hypothyroidism   . Neuropathy   . OSA on CPAP   . Prostate cancer [facility])     Past Surgical History:  Procedure Laterality Date  . ABDOMINAL HERNIA REPAIR    . BIOPSY  11/26/2015   Procedure: BIOPSY;  Surgeon: Rogene Houston, MD;  [facility];  Service: Endoscopy;;  gastric esophagus  . CARDIAC CATHETERIZATION N/A 04/10/2015   Procedure: Left Heart Cath and Cors/Grafts Angiography;  Surgeon: Sherren Mocha, MD;  Location: [facility] LAB;  Service: Cardiovascular;  Laterality: N/A;  . CARDIAC CATHETERIZATION N/A 04/10/2015   Procedure: Coronary Stent Intervention;  Surgeon: Sherren Mocha, MD;  Location: [place] [facility];  Service: Cardiovascular;  Laterality: N/A;  . CATARACT EXTRACTION W/PHACO Right 06/23/2015   Procedure: CATARACT EXTRACTION PHACO AND INTRAOCULAR LENS PLACEMENT RIGHT EYE; CDE:  7.08;  Surgeon: Tonny Branch, MD;  Location: [facility] OR[facility];  Service: Ophthalmology;  Laterality: Right;  . CATARACT EXTRACTION W/PHACO Left 08/25/2015   Procedure: CATARACT EXTRACTION PHACO AND INTRAOCULAR LENS PLACEMENT LEFT EYE; CDE:  8.18;  Surgeon: Tonny Branch, MD;  Location: [facility];  Service: Ophthalmology;  Laterality: Left;  . COLONOSCOPY N/A 12/28/2012   Procedure: COLONOSCOPY;  Surgeon: Rogene Houston, MD;  [facility];  Service: Endoscopy;  Laterality: N/A;  830 rescheduled  . CORONARY ANGIOPLASTY WITH STENT PLACEMENT  2013; 04/10/2015   "this makes me a total of 7" (04/10/2015)  . CORONARY ARTERY BYPASS GRAFT  1997   with (LIMA)  . ESOPHAGEAL DILATION N/A 11/26/2015   Procedure: ESOPHAGEAL DILATION;  Surgeon: Rogene Houston, MD;  [facility];  Service: Endoscopy;  Laterality: N/A;  . ESOPHAGOGASTRODUODENOSCOPY N/A 11/26/2015   Procedure: ESOPHAGOGASTRODUODENOSCOPY (EGD);  Surgeon: Rogene Houston, MD;  [facility];  Service: Endoscopy;  Laterality: N/A;  1:25  . HERNIA REPAIR Left   . KNEE CARTILAGE SURGERY Bilateral   . LEFT HEART  CATHETERIZATION WITH CORONARY ANGIOGRAM N/A 09/08/2011   Procedure: LEFT HEART CATHETERIZATION WITH CORONARY ANGIOGRAM;  Surgeon: Sherren Mocha, MD;  Location: [facility] CATH LAB;  Service: Cardiovascular;  Laterality: N/A;  . PERCUTANEOUS CORONARY STENT INTERVENTION (PCI-S) Right 09/08/2011   Procedure: PERCUTANEOUS CORONARY STENT INTERVENTION (PCI-S);  Surgeon: Sherren Mocha, MD;  Location: [facility] CATH LAB;  Service: Cardiovascular;  Laterality: Right;  . ROBOT ASSISTED LAPAROSCOPIC RADICAL PROSTATECTOMY       Home Medications:  Prior to Admission medications   Medication Sig Start Date End Date Taking? Authorizing Provider  amLODipine (NORVASC) 5 MG tablet TAKE 1 TABLET BY MOUTH EVERY DAY 06/11/16  Yes Sherren Mocha, MD  aspirin 81 MG tablet Take 1 tablet (81 mg total) by mouth at bedtime. 11/28/15  Yes Rehman, Mechele Dawley, MD  atorvastatin (LIPITOR) 40 MG tablet TAKE 1 TABLET(40 MG) BY MOUTH DAILY AT 6 PM Patient taking differently: TAKE 1 TABLET(40 MG) BY MOUTH DAILY 03/10/16  Yes Bhagat, Bhavinkumar, PA  celecoxib (CELEBREX) 200 MG capsule Take 1 capsule (200 mg total) by mouth daily. 01/26/12  Yes Hillary Bow, MD  cholecalciferol (VITAMIN D) 1000 units tablet Take 1,000 Units by mouth 2 (two) times daily.    Yes [provider]  clopidogrel (PLAVIX) 75 MG tablet Take 75 mg by mouth every morning.  11/28/15  Yes Rehman, Mechele Dawley, MD  Coenzyme Q10 (COQ10) 200 MG CAPS Take 200 mg by mouth every evening.    Yes [provider]  cyanocobalamin (,VITAMIN B-12,) 1000 MCG/ML injection Inject 1,000 mcg into the muscle every 30 (thirty) days.  03/25/15  Yes [provider]  gabapentin (NEURONTIN) 600 MG tablet Take 600 mg by mouth 3 (three) times daily. 02/27/15  Yes [provider]  glucosamine-chondroitin 500-400 MG tablet Take 1 tablet by mouth daily.   Yes [provider]  isosorbide mononitrate (IMDUR) 30 MG 24 hr tablet TAKE 1 TABLET(30 MG) BY MOUTH DAILY  03/31/16  Yes Sherren Mocha, MD  levothyroxine (SYNTHROID, LEVOTHROID) 150 MCG tablet Take 150 mcg by mouth daily before breakfast.   Yes [provider]  lisinopril (PRINIVIL,ZESTRIL) 5 MG tablet TAKE 1 TABLET BY MOUTH EVERY NIGHT AT BEDTIME   Yes Sherren Mocha, MD  metoprolol tartrate (LOPRESSOR) 25 MG tablet Take 25 mg by mouth daily.    Yes [provider]  Multiple Vitamin (MULTIVITAMIN) tablet Take 1 tablet by mouth every other day.    Yes [provider]  niacin 500 MG tablet Take 500 mg by mouth every evening.   Yes [provider]  nitroGLYCERIN (NITROSTAT) 0.4 MG SL tablet PLACE 1 TABLET UNDER THE TONGUE EVERY 5 MINUTES AS NEEDED FOR CHEST PAIN 09/01/16  Yes Sherren Mocha, MD  Omega-3 Fatty Acids (FISH OIL) 1200 MG CAPS Take 2 capsules by mouth 2 (two) times daily.    Yes [provider]  pantoprazole (PROTONIX) 40 MG tablet Take 40 mg by mouth 2 (two) times daily.   Yes [provider]  traMADol (ULTRAM) 50 MG tablet Take  50 mg by mouth every 8 (eight) hours.  12/24/13  Yes [provider]    Inpatient Medications: Scheduled Meds: . amLODipine  5 mg Oral Daily  . aspirin EC  81 mg Oral QHS  . atorvastatin  40 mg Oral q1800  . clopidogrel  75 mg Oral q morning - 10a  . enoxaparin (LOVENOX) injection  40 mg Subcutaneous Q24H  . gabapentin  600 mg Oral TID  . levothyroxine  150 mcg Oral QAC breakfast  . lisinopril  5 mg Oral QHS  . metoprolol tartrate  25 mg Oral Daily  . multivitamin with minerals  1 tablet Oral QODAY  . niacin  500 mg Oral QPM  . nitroGLYCERIN  0.5 inch Topical Q6H  . pantoprazole  40 mg Oral BID  . traMADol  50 mg Oral Q8H   Continuous Infusions: . sodium chloride     PRN Meds: acetaminophen **OR** acetaminophen, ondansetron **OR** ondansetron (ZOFRAN) IV  Allergies:    Allergies  Allergen Reactions  . Codeine Nausea And Vomiting  . Pollen Extract Other (See Comments)    Seasonal     Social History:   Social History   Social History  . Marital status: Widowed    Spouse name: N/A  . Number of children: N/A  . Years of education: N/A   Occupational History  . semi-retired    Social History Main Topics  . Smoking status: Former Smoker    Years: 12.00    Types: Cigars  . Smokeless tobacco: Never Used     Comment: "quit smoking in ~ 1978"  . Alcohol use Yes     Comment: 04/10/2015 "I've drank 4 beers in the last 2 months"  . Drug use: No  . Sexual activity: Not Currently   Other Topics Concern  . Not on file   Social History Narrative  . No narrative on file    Family History:    Family History  Problem Relation Age of Onset  . Cancer Father 40       died  . Stroke Mother 100       died     ROS:  Please see the history of present illness.  Review of Systems  Constitution: Negative.  HENT: Negative.   Cardiovascular: Positive for chest pain and dyspnea on exertion.  Respiratory: Negative.   Endocrine: Negative.   Hematologic/Lymphatic: Negative.   Musculoskeletal: Negative.   Gastrointestinal: Positive for heartburn.  Genitourinary: Negative.   Neurological: Negative.     All other ROS reviewed and negative.     Physical Exam/Data:   Vitals:   09/08/16 0107 09/08/16 0556 09/08/16 0730 09/08/16 0901  BP: (!) 142/79 (!) 107/53 (!) 143/67 (!) 154/62  Pulse: 60 (!) 55 (!) 54 (!) 53  Resp: 18 14 12 18   Temp:    97.7 F (36.5 C)  TempSrc:    Oral  SpO2: 98% 98% 98% 98%  Weight:      Height:    6' (1.829 m)   No intake or output data in the 24 hours ending 09/08/16 0917 Filed Weights   09/07/16 2022  Weight: 213 lb (96.6 kg)   Body mass index is 28.89 kg/m.  General:  Well nourished, well developed, in no acute distress HEENT: normal Lymph: no adenopathy Neck: no JVD Endocrine:  No thryomegaly Vascular: bilateral carotid bruits vs murmur portrayed; FA pulses 2+ bilaterally without bruits  Cardiac:  normal S1, S2; RRR; 2/6  sys murmur RSB LSB Lungs:  clear to auscultation bilaterally, no wheezing, rhonchi or rales  Abd: soft, nontender, no hepatomegaly  Ext: no edema Musculoskeletal:  No deformities, BUE and BLE strength normal and equal Skin: warm and dry  Neuro:  CNs 2-12 intact, no focal abnormalities noted Psych:  Normal affect   EKG:  The EKG was personally reviewed and demonstrates:  NSR with IRBBB unchanged Telemetry:  Telemetry was personally reviewed and demonstrates:  NSR  Relevant CV Studies:  Cardiac cath 04/10/15 Conclusion   Mid RCA lesion, 100% stenosed.  RIMA .  Patent graft - free RIMA  Prox LAD lesion, 100% stenosed.  LIMA .  LIMA-LAD patent  Ost LM to LM lesion, 25% stenosed. The lesion was previously treated with a stent (unknown type).  Ost Ramus to Ramus lesion, 25% stenosed. The lesion was previously treated with a stent (unknown type).  Ost RPDA lesion, 100% stenosed. Post intervention, there is a 0% residual stenosis.  Dist RCA lesion, 99% stenosed. Post intervention, there is a 0% residual stenosis.   1. Severe native vessel CAD with total occlusion of the RCA, total occlusion of the LAD, and patency of the stented segment of the left main into the ramus.  2. Severe stenosis of the distal RCA treated successfully with PCI (drug-eluting stent) 3. Total occlusion of the PDA following distal RCA stenting, likely due to plaque shifting, treated successfully with PCI (DES), but with distal vessel occlusion possibly due to distal vessel dissection (pt asymptomatic)   Continue DAPT. Will transition to [place] tomorrow am. Pt would be a candidate for the Twilight Trial if he meets criteria.        Laboratory Data:  Chemistry Recent Labs Lab 09/07/16 2027 09/08/16 0551  NA 138 139  K 3.7 3.5  CL 105 105  CO2 24 27  GLUCOSE 165* 105*  BUN 29* 24*  CREATININE 1.37* 1.10  CALCIUM 8.8* 8.4*  GFRNONAA 50* >60  GFRAA 57* >60  ANIONGAP 9 7     Recent Labs Lab  09/08/16 0551  PROT 6.2*  ALBUMIN 3.6  AST 21  ALT 18  ALKPHOS 57  BILITOT 0.4   Hematology Recent Labs Lab 09/07/16 2027 09/08/16 0551  WBC 5.9 5.0  RBC 3.34* 3.20*  HGB 11.3* 10.6*  HCT 32.4* 31.1*  MCV 97.0 97.2  MCH 33.8 33.1  MCHC 34.9 34.1  RDW 13.3 13.2  PLT 160 150   Cardiac Enzymes Recent Labs Lab 09/07/16 2027 09/08/16 0042 09/08/16 0551  TROPONINI <0.03 <0.03 <0.03   No results for input(s): TROPIPOC in the last 168 hours.  BNPNo results for input(s): BNP, PROBNP in the last 168 hours.  DDimer No results for input(s): DDIMER in the last 168 hours.  Radiology/Studies:  Dg Chest Portable 1 View  Result Date: 09/07/2016 CLINICAL DATA:  Patient states mid chest pain/ tightness and SOB on Sunday that eased off but has come back today. Hx of coronary artery disease, HTN, CABG, coronary stent placement, heart cath, and former smoker. EXAM: PORTABLE CHEST 1 VIEW COMPARISON:  10/30/2013 FINDINGS: There stable changes from previous CABG surgery. Left coronary artery stent is also stable. The cardiac silhouette is normal in size. No mediastinal or hilar masses or evidence of adenopathy. Clear lungs.  No evidence of a pleural effusion.  No pneumothorax. Skeletal structures are intact. IMPRESSION: No acute cardiopulmonary disease. Electronically Signed   By: Lajean Manes M.D.   On: 09/07/2016 20:49    Assessment and Plan:   1.Unstable angina-begin IV heparin  and transfer to [facility] for cardiac cath. Patient agreeable.I have reviewed the risks, indications, and alternatives to angioplasty and stenting with the patient. Risks include but are not limited to bleeding, infection, vascular injury, stroke, myocardial infection, arrhythmia, kidney injury, radiation-related injury in the case of prolonged fluoroscopy use, emergency cardiac surgery, and death. The patient understands the risks of serious complication is low (<1%) and he agrees to proceed.  2.CAD has CAD and underwent  2v CABG with LIMA to LAD and RIMA to RCA 1997, hypertension, hyperlipidemia, hypothyroidism and chronic back pain. Underwent PCI in March 2017 after presenting with exertional angina and an abnormal nuclear scan. Treated with overlapping DES in the distal RCA through the graft to the RCA. Now with unstable angina as above  3. GERD with esophageal dilatation 11/2015  4. Renal insufficiency on admission Crt 1.37 but normal today 1.10. Replace potassium.  5. Chronic anemia  6. HTN well controlled  HLD on atorvastatin  Signed, Ermalinda Barrios, PA-C  09/08/2016 9:17 AM   Attending note:  Patient seen and examined. Reviewed records and discussed the case with Ms. Bonnell Public PA-C. Mr. Maslowski has a history of CAD status post CABG with LIMA to LAD and RIMA to RCA in 1997, multiple PCI to the ramus (stenting of left main into the ramus), and most recently DES intervention to the RCA and PDA in March 2017. He last saw Dr. Burt Knack in October 2017. Over the last few weeks he has been having increasing episodes of angina, using nitroglycerin occasionally with exertion, and within the last few days having more prolonged episodes of chest pain occurring more frequently and requiring more nitroglycerin. He states his symptoms are very consistent with previous angina and he reports no other changes in his medications.  Examination he appears comfortable, heart rate in the 50s to 60s in sinus rhythm by telemetry which I personally reviewed. Systolic blood pressure 610R to 150s. Lungs are clear without labored breathing at rest. Cardiac exam reveals RRR with 2/6 systolic murmur no gallop. Lab work shows potassium 3.5, creatinine 1.1, normal troponin I 3, hemoglobin 10.6, platelets 150. I personally reviewed his ECG from 09/07/2016 which showed sinus rhythm with incomplete right bundle branch block. Chest x-ray shows no acute cardiopulmonary abnormalities.  Patient presents with unstable angina, very consistent with  prior symptoms. Last coronary intervention was in March 2017 as detailed above. Troponin I normal and ECG without acute ST segment changes. Plan is to transfer to our cardiology service at [facility] in anticipation of a diagnostic cardiac catheterization with eye toward revascularization options. Heparin is being initiated. Risks and benefits discussed with the patient, and he is in agreement to proceed.  Satira Sark, M.D., F.A.C.C.

## 2016-09-08 NOTE — H&P (Signed)
TRH H&P    Patient Demographics:    Garner Dullea, is a 71 y.o. male  MRN: [id number]  DOB - 1945-10-15  Admit Date - 09/07/2016  Referring MD/NP/PA: Dr. Lita Mains  Outpatient Primary MD for the patient is Sharilyn Sites, MD  Patient coming from: Home  Chief Complaint  Patient presents with  . Chest Pain      HPI:    Tomio Kirk  is a 71 y.o. male, With history of CAD status post CABG, status post DES to left main, GERD, hypertension, hypothyroidism, hyperlipidemia came to hospital with central chest tightness started Sunday while walking. Patient took nitroglycerin at that time which improved his symptoms. Today again he had chest tightness with shortness of breath and took 2 nitroglycerin with improvement of symptoms. The patient came to hospital for further evaluation. Currently he has only slight discomfort in chest. He received nitroglycerin patch 1 inch in the ED. Troponin is negative in the ED. Cardiology fellow was called at [facility], and he recommended patient to be placed under observation and to be evaluated by cardiology in a.m.  Patient denies nausea vomiting. Complains of bloating. No shortness of breath at this time. No fever or dysuria.    Review of systems:     All other systems reviewed and are negative.   With Past History of the following :    Past Medical History:  Diagnosis Date  . Anginal pain ([place])   . Arthritis    "all over"  . Chronic lower back pain   . Coronary artery disease    a. s/p CABG x 2 (LIMA->LAD, RIMA->RCA);  b. s/p multiple PCI's to Ramus;  c. 08/2011 Cath/PCI: LM 70% into ramus with 70-80% there->treated wtih 3.5x18 Xience Xpedition DES, LCX  nonobs, RCA occluded.  RIMA & LIMA patent, EF 55-65%  . DJD (degenerative joint disease)   . GERD (gastroesophageal reflux disease)   . History of gout   . Hyperlipidemia   . Hypertension   .  Hypothyroidism   . Neuropathy   . OSA on CPAP   . Prostate cancer [facility])       Past Surgical History:  Procedure Laterality Date  . ABDOMINAL HERNIA REPAIR    . BIOPSY  11/26/2015   Procedure: BIOPSY;  Surgeon: Rogene Houston, MD;  [facility];  Service: Endoscopy;;  gastric esophagus  . CARDIAC CATHETERIZATION N/A 04/10/2015   Procedure: Left Heart Cath and Cors/Grafts Angiography;  Surgeon: Sherren Mocha, MD;  Location: [place] CV LAB;  Service: Cardiovascular;  Laterality: N/A;  . CARDIAC CATHETERIZATION N/A 04/10/2015   Procedure: Coronary Stent Intervention;  Surgeon: Sherren Mocha, MD;  Location: [place] CV LAB;  Service: Cardiovascular;  Laterality: N/A;  . CATARACT EXTRACTION W/PHACO Right 06/23/2015   Procedure: CATARACT EXTRACTION PHACO AND INTRAOCULAR LENS PLACEMENT RIGHT EYE; CDE:  7.08;  Surgeon: Tonny Branch, MD;  Location: [facility] OR[facility];  Service: Ophthalmology;  Laterality: Right;  . CATARACT EXTRACTION W/PHACO Left 08/25/2015   Procedure: CATARACT EXTRACTION PHACO AND INTRAOCULAR LENS PLACEMENT  LEFT EYE; CDE:  8.18;  Surgeon: Tonny Branch, MD;  Location: [facility];  Service: Ophthalmology;  Laterality: Left;  . COLONOSCOPY N/A 12/28/2012   Procedure: COLONOSCOPY;  Surgeon: Rogene Houston, MD;  [facility];  Service: Endoscopy;  Laterality: N/A;  830 rescheduled  . CORONARY ANGIOPLASTY WITH STENT PLACEMENT  2013; 04/10/2015   "this makes me a total of 7" (04/10/2015)  . CORONARY ARTERY BYPASS GRAFT  1997   with (LIMA)  . ESOPHAGEAL DILATION N/A 11/26/2015   Procedure: ESOPHAGEAL DILATION;  Surgeon: Rogene Houston, MD;  [facility];  Service: Endoscopy;  Laterality: N/A;  . ESOPHAGOGASTRODUODENOSCOPY N/A 11/26/2015   Procedure: ESOPHAGOGASTRODUODENOSCOPY (EGD);  Surgeon: Rogene Houston, MD;  [facility];  Service: Endoscopy;  Laterality: N/A;  1:25  . HERNIA REPAIR Left   . KNEE CARTILAGE SURGERY Bilateral   . LEFT HEART  CATHETERIZATION WITH CORONARY ANGIOGRAM N/A 09/08/2011   Procedure: LEFT HEART CATHETERIZATION WITH CORONARY ANGIOGRAM;  Surgeon: Sherren Mocha, MD;  [facility] CATH LAB;  Service: Cardiovascular;  Laterality: N/A;  . PERCUTANEOUS CORONARY STENT INTERVENTION (PCI-S) Right 09/08/2011   Procedure: PERCUTANEOUS CORONARY STENT INTERVENTION (PCI-S);  Surgeon: Sherren Mocha, MD;  Location: [facility] CATH LAB;  Service: Cardiovascular;  Laterality: Right;  . ROBOT ASSISTED LAPAROSCOPIC RADICAL PROSTATECTOMY        Social History:      Social History  Substance Use Topics  . Smoking status: Former Smoker    Years: 12.00    Types: Cigars  . Smokeless tobacco: Never Used     Comment: "quit smoking in ~ 1978"  . Alcohol use Yes     Comment: 04/10/2015 "I've drank 4 beers in the last 2 months"       Family History :     Family History  Problem Relation Age of Onset  . Cancer Father 9       died  . Stroke Mother 58       died      Home Medications:   Prior to Admission medications   Medication Sig Start Date End Date Taking? Authorizing Provider  amLODipine (NORVASC) 5 MG tablet TAKE 1 TABLET BY MOUTH EVERY DAY 06/11/16  Yes Sherren Mocha, MD  aspirin 81 MG tablet Take 1 tablet (81 mg total) by mouth at bedtime. 11/28/15  Yes Rehman, Mechele Dawley, MD  atorvastatin (LIPITOR) 40 MG tablet TAKE 1 TABLET(40 MG) BY MOUTH DAILY AT 6 PM Patient taking differently: TAKE 1 TABLET(40 MG) BY MOUTH DAILY 03/10/16  Yes Bhagat, Bhavinkumar, PA  celecoxib (CELEBREX) 200 MG capsule Take 1 capsule (200 mg total) by mouth daily. 01/26/12  Yes Hillary Bow, MD  cholecalciferol (VITAMIN D) 1000 units tablet Take 1,000 Units by mouth 2 (two) times daily.    Yes [provider]  clopidogrel (PLAVIX) 75 MG tablet Take 75 mg by mouth every morning.  11/28/15  Yes Rehman, Mechele Dawley, MD  Coenzyme Q10 (COQ10) 200 MG CAPS Take 200 mg by mouth every evening.    Yes [provider]  cyanocobalamin  (,VITAMIN B-12,) 1000 MCG/ML injection Inject 1,000 mcg into the muscle every 30 (thirty) days.  03/25/15  Yes [provider]  gabapentin (NEURONTIN) 600 MG tablet Take 600 mg by mouth 3 (three) times daily. 02/27/15  Yes [provider]  glucosamine-chondroitin 500-400 MG tablet Take 1 tablet by mouth daily.   Yes [provider]  isosorbide mononitrate (IMDUR) 30 MG 24 hr tablet  TAKE 1 TABLET(30 MG) BY MOUTH DAILY 03/31/16  Yes Sherren Mocha, MD  levothyroxine (SYNTHROID, LEVOTHROID) 150 MCG tablet Take 150 mcg by mouth daily before breakfast.   Yes [provider]  lisinopril (PRINIVIL,ZESTRIL) 5 MG tablet TAKE 1 TABLET BY MOUTH EVERY NIGHT AT BEDTIME   Yes Sherren Mocha, MD  metoprolol tartrate (LOPRESSOR) 25 MG tablet Take 25 mg by mouth daily.    Yes [provider]  Multiple Vitamin (MULTIVITAMIN) tablet Take 1 tablet by mouth every other day.    Yes [provider]  niacin 500 MG tablet Take 500 mg by mouth every evening.   Yes [provider]  nitroGLYCERIN (NITROSTAT) 0.4 MG SL tablet PLACE 1 TABLET UNDER THE TONGUE EVERY 5 MINUTES AS NEEDED FOR CHEST PAIN 09/01/16  Yes Sherren Mocha, MD  Omega-3 Fatty Acids (FISH OIL) 1200 MG CAPS Take 2 capsules by mouth 2 (two) times daily.    Yes [provider]  pantoprazole (PROTONIX) 40 MG tablet Take 40 mg by mouth 2 (two) times daily.   Yes [provider]  traMADol (ULTRAM) 50 MG tablet Take 50 mg by mouth every 8 (eight) hours.  12/24/13  Yes [provider]     Allergies:     Allergies  Allergen Reactions  . Codeine Nausea And Vomiting  . Pollen Extract Other (See Comments)    Seasonal     Physical Exam:   Vitals  Blood pressure 139/70, pulse (!) 57, temperature 98.4 F (36.9 C), temperature source Oral, resp. rate 20, height 6' (1.829 m), weight 96.6 kg (213 lb), SpO2 97 %.  1.  General: Appears in no acute distress  2. Psychiatric:   Intact judgement and  insight, awake alert, oriented x 3.  3. Neurologic: No focal neurological deficits, all cranial nerves intact.Strength 5/5 all 4 extremities, sensation intact all 4 extremities, plantars down going.  4. Eyes :  anicteric sclerae, moist conjunctivae with no lid lag. PERRLA.  5. ENMT:  Oropharynx clear with moist mucous membranes and good dentition  6. Neck:  supple, no cervical lymphadenopathy appriciated, No thyromegaly  7. Respiratory : Normal respiratory effort, good air movement bilaterally,clear to  auscultation bilaterally  8. Cardiovascular : RRR, no gallops, rubs or murmurs, no leg edema  9. Gastrointestinal:  Positive bowel sounds, abdomen soft, non-tender to palpation,no hepatosplenomegaly, no rigidity or guarding       10. Skin:  No cyanosis, normal texture and turgor, no rash, lesions or ulcers  11.Musculoskeletal:  Good muscle tone,  joints appear normal , no effusions,  normal range of motion    Data Review:    CBC  Recent Labs Lab 09/07/16 2027  WBC 5.9  HGB 11.3*  HCT 32.4*  PLT 160  MCV 97.0  MCH 33.8  MCHC 34.9  RDW 13.3   ------------------------------------------------------------------------------------------------------------------  Chemistries   Recent Labs Lab 09/07/16 2027  NA 138  K 3.7  CL 105  CO2 24  GLUCOSE 165*  BUN 29*  CREATININE 1.37*  CALCIUM 8.8*   ------------------------------------------------------------------------------------------------------------------  ------------------------------------------------------------------------------------------------------------------ GFR: Estimated Creatinine Clearance: 57.9 mL/min (A) (by C-G formula based on SCr of 1.37 mg/dL (H)). Liver Function Tests: No results for input(s): AST, ALT, ALKPHOS, BILITOT, PROT, ALBUMIN in the last 168 hours. No results for input(s): LIPASE, AMYLASE in the last 168 hours. No results for input(s): AMMONIA in the last  168 hours. Coagulation Profile: No results for input(s): INR, PROTIME in the last 168 hours. Cardiac Enzymes:  Recent Labs  Lab 09/07/16 2027  TROPONINI <0.03   BNP (last 3 results) No results for input(s): PROBNP in the last 8760 hours. HbA1C: No results for input(s): HGBA1C in the last 72 hours. CBG: No results for input(s): GLUCAP in the last 168 hours. Lipid Profile: No results for input(s): CHOL, HDL, LDLCALC, TRIG, CHOLHDL, LDLDIRECT in the last 72 hours. Thyroid Function Tests: No results for input(s): TSH, T4TOTAL, FREET4, T3FREE, THYROIDAB in the last 72 hours. Anemia Panel: No results for input(s): VITAMINB12, FOLATE, FERRITIN, TIBC, IRON, RETICCTPCT in the last 72 hours.  --------------------------------------------------------------------------------------------------------------- Urine analysis: No results found for: COLORURINE, APPEARANCEUR, LABSPEC, PHURINE, GLUCOSEU, HGBUR, BILIRUBINUR, KETONESUR, PROTEINUR, UROBILINOGEN, NITRITE, LEUKOCYTESUR    Imaging Results:    Dg Chest Portable 1 View  Result Date: 09/07/2016 CLINICAL DATA:  Patient states mid chest pain/ tightness and SOB on Sunday that eased off but has come back today. Hx of coronary artery disease, HTN, CABG, coronary stent placement, heart cath, and former smoker. EXAM: PORTABLE CHEST 1 VIEW COMPARISON:  10/30/2013 FINDINGS: There stable changes from previous CABG surgery. Left coronary artery stent is also stable. The cardiac silhouette is normal in size. No mediastinal or hilar masses or evidence of adenopathy. Clear lungs.  No evidence of a pleural effusion.  No pneumothorax. Skeletal structures are intact. IMPRESSION: No acute cardiopulmonary disease. Electronically Signed   By: Lajean Manes M.D.   On: 09/07/2016 20:49    My personal review of EKG: Rhythm NSR   Assessment & Plan:    Active Problems:   Hypothyroidism   Essential hypertension   Coronary artery disease   Chest pain  1. Chest  pain-improved after getting Nitropaste in the ED. patient has significant history of CAD status post CABG, will place under observation. Cycle cardiac enzymes. Will start Nitropaste half-inch every 6 hours. Consult cardiology in a.m. 2. GERD- will give 1 dose of Maalox. 3. CAD status post CABG/DES to left main-continue home medications including aspirin, Lipitor, Plavix. We'll hold the Imdur as patient is on Nitropaste. 4. Hypothyroidism-continue Synthroid 5. Hypertension-blood pressure stable, continue metoprolol, lisinopril   DVT Prophylaxis-   Lovenox   AM Labs Ordered, also please review Full Orders  Family Communication: Admission, patients condition and plan of care including tests being ordered have been discussed with the patient and  who indicate understanding and agree with the plan and Code Status.  Code Status:  Full code  Admission status: Observation    Time spent in minutes : 60 minutes   Kodi Guerrera S M.D on 09/08/2016 at 12:18 AM  Between 7am to 7pm - Pager - [phone]. After 7pm go to [email or web address] - password [email or web address]  [facility] - Office  [phone]

## 2016-09-08 NOTE — Interval H&P Note (Signed)
History and Physical Interval Note:  09/08/2016 5:31 PM  [name]  has presented today for surgery, with the diagnosis of cp  The various methods of treatment have been discussed with the patient and family. After consideration of risks, benefits and other options for treatment, the patient has consented to  Procedure(s): LEFT HEART CATH AND CORS/GRAFTS ANGIOGRAPHY (N/A) as a surgical intervention .  The patient's history has been reviewed, patient examined, no change in status, stable for surgery.  I have reviewed the patient's chart and labs.  Questions were answered to the patient's satisfaction.   Cath Lab Visit (complete for each Cath Lab visit)  Clinical Evaluation Leading to the Procedure:   ACS: Yes.    Non-ACS:    Anginal Classification: CCS III  Anti-ischemic medical therapy: Maximal Therapy (2 or more classes of medications)  Non-Invasive Test Results: No non-invasive testing performed  Prior CABG: Previous CABG        [name] [facility] 09/08/2016 5:32 PM

## 2016-09-08 NOTE — Consult Note (Signed)
Cardiology Consultation:   Patient ID: [name]; [id number]; 1945-10-03   Admit date: 09/07/2016 Date of Consult: 09/08/2016  Primary Care Provider: Sharilyn Sites, MD Primary Cardiologist: Dr. Sherren Mocha   Patient Profile:   [name] is a 71 y.o. male with a hx of CAD who is being seen today for the evaluation of chest pain at the request of Dr. Sarajane Jews.  History of Present Illness:   [name] has a history of CAD and underwent 2v CABG with LIMA to LAD and RIMA to RCA 1997, hypertension, hyperlipidemia, hypothyroidism and chronic back pain. Underwent PCI in March 2017 after presenting with exertional angina and an abnormal nuclear scan. Treated with overlapping DES in the distal RCA through the graft to the RCA. History of GERD with dysphagia s/p esophageal dilatation 11/2015, anemia with B12 shots.  Patient had exertional chest tightness while walking last week and again Sunday night eased with rest. Yest walked on treadmill at gym and became short of breath so stopped. 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 Developed chest tightness relieved with 2 NTG but returned quickly while at rest so came to ED relieved with NTG paste. Similar to prior angina. Has done well since last stent and last saw Dr. Burt Knack 10/2015. Also complains of increased gas and belching last night. Troponins negative x 3 EKG without acute change.  Past Medical History:  Diagnosis Date  . Anginal pain ([place])   . Arthritis    "all over"  . Chronic lower back pain   . Coronary artery disease    a. s/p CABG x 2 (LIMA->LAD, RIMA->RCA);  b. s/p multiple PCI's to Ramus;  c. 08/2011 Cath/PCI: LM 70% into ramus with 70-80% there->treated wtih 3.5x18 Xience Xpedition DES, LCX  nonobs, RCA occluded.  RIMA & LIMA patent, EF 55-65%  . DJD (degenerative joint disease)   . GERD (gastroesophageal reflux disease)   . History of gout   . Hyperlipidemia   . Hypertension   .  Hypothyroidism   . Neuropathy   . OSA on CPAP   . Prostate cancer [facility])     Past Surgical History:  Procedure Laterality Date  . ABDOMINAL HERNIA REPAIR    . BIOPSY  11/26/2015   Procedure: BIOPSY;  Surgeon: Rogene Houston, MD;  Location[facility];  Service: Endoscopy;;  gastric esophagus  . CARDIAC CATHETERIZATION N/A 04/10/2015   Procedure: Left Heart Cath and Cors/Grafts Angiography;  Surgeon: Sherren Mocha, MD;  Location: [place] CV LAB;  Service: Cardiovascular;  Laterality: N/A;  . CARDIAC CATHETERIZATION N/A 04/10/2015   Procedure: Coronary Stent Intervention;  Surgeon: Sherren Mocha, MD;  Location: [place] CV LAB;  Service: Cardiovascular;  Laterality: N/A;  . CATARACT EXTRACTION W/PHACO Right 06/23/2015   Procedure: CATARACT EXTRACTION PHACO AND INTRAOCULAR LENS PLACEMENT RIGHT EYE; CDE:  7.08;  Surgeon: Tonny Branch, MD;  Location: [facility] ORS;  Service: Ophthalmology;  Laterality: Right;  . CATARACT EXTRACTION W/PHACO Left 08/25/2015   Procedure: CATARACT EXTRACTION PHACO AND INTRAOCULAR LENS PLACEMENT LEFT EYE; CDE:  8.18;  Surgeon: Tonny Branch, MD;  Location: [facility] ORS;  Service: Ophthalmology;  Laterality: Left;  . COLONOSCOPY N/A 12/28/2012   Procedure: COLONOSCOPY;  Surgeon: Rogene Houston, MD;  Location: [facility];  Service: Endoscopy;  Laterality: N/A;  830 rescheduled  . CORONARY ANGIOPLASTY WITH STENT PLACEMENT  2013; 04/10/2015   "this makes me a total of 7" (04/10/2015)  . CORONARY ARTERY BYPASS GRAFT  1997   with (LIMA)  . ESOPHAGEAL DILATION N/A 11/26/2015   Procedure: ESOPHAGEAL DILATION;  Surgeon: Rogene Houston, MD;  [facility];  Service: Endoscopy;  Laterality: N/A;  . ESOPHAGOGASTRODUODENOSCOPY N/A 11/26/2015   Procedure: ESOPHAGOGASTRODUODENOSCOPY (EGD);  Surgeon: Rogene Houston, MD;  Location[facility];  Service: Endoscopy;  Laterality: N/A;  1:25  . HERNIA REPAIR Left   . KNEE CARTILAGE SURGERY Bilateral   . LEFT HEART  CATHETERIZATION WITH CORONARY ANGIOGRAM N/A 09/08/2011   Procedure: LEFT HEART CATHETERIZATION WITH CORONARY ANGIOGRAM;  Surgeon: Sherren Mocha, MD;  Location: [facility] CATH LAB;  Service: Cardiovascular;  Laterality: N/A;  . PERCUTANEOUS CORONARY STENT INTERVENTION (PCI-S) Right 09/08/2011   Procedure: PERCUTANEOUS CORONARY STENT INTERVENTION (PCI-S);  Surgeon: Sherren Mocha, MD;  Location: [facility] CATH LAB;  Service: Cardiovascular;  Laterality: Right;  . ROBOT ASSISTED LAPAROSCOPIC RADICAL PROSTATECTOMY       Home Medications:  Prior to Admission medications   Medication Sig Start Date End Date Taking? Authorizing Provider  amLODipine (NORVASC) 5 MG tablet TAKE 1 TABLET BY MOUTH EVERY DAY 06/11/16  Yes Sherren Mocha, MD  aspirin 81 MG tablet Take 1 tablet (81 mg total) by mouth at bedtime. 11/28/15  Yes Rehman, Mechele Dawley, MD  atorvastatin (LIPITOR) 40 MG tablet TAKE 1 TABLET(40 MG) BY MOUTH DAILY AT 6 PM Patient taking differently: TAKE 1 TABLET(40 MG) BY MOUTH DAILY 03/10/16  Yes Bhagat, Bhavinkumar, PA  celecoxib (CELEBREX) 200 MG capsule Take 1 capsule (200 mg total) by mouth daily. 01/26/12  Yes Hillary Bow, MD  cholecalciferol (VITAMIN D) 1000 units tablet Take 1,000 Units by mouth 2 (two) times daily.    Yes [provider]  clopidogrel (PLAVIX) 75 MG tablet Take 75 mg by mouth every morning.  11/28/15  Yes Rehman, Mechele Dawley, MD  Coenzyme Q10 (COQ10) 200 MG CAPS Take 200 mg by mouth every evening.    Yes [provider]  cyanocobalamin (,VITAMIN B-12,) 1000 MCG/ML injection Inject 1,000 mcg into the muscle every 30 (thirty) days.  03/25/15  Yes [provider]  gabapentin (NEURONTIN) 600 MG tablet Take 600 mg by mouth 3 (three) times daily. 02/27/15  Yes [provider]  glucosamine-chondroitin 500-400 MG tablet Take 1 tablet by mouth daily.   Yes [provider]  isosorbide mononitrate (IMDUR) 30 MG 24 hr tablet TAKE 1 TABLET(30 MG) BY MOUTH DAILY  03/31/16  Yes Sherren Mocha, MD  levothyroxine (SYNTHROID, LEVOTHROID) 150 MCG tablet Take 150 mcg by mouth daily before breakfast.   Yes [provider]  lisinopril (PRINIVIL,ZESTRIL) 5 MG tablet TAKE 1 TABLET BY MOUTH EVERY NIGHT AT BEDTIME   Yes Sherren Mocha, MD  metoprolol tartrate (LOPRESSOR) 25 MG tablet Take 25 mg by mouth daily.    Yes [provider]  Multiple Vitamin (MULTIVITAMIN) tablet Take 1 tablet by mouth every other day.    Yes [provider]  niacin 500 MG tablet Take 500 mg by mouth every evening.   Yes [provider]  nitroGLYCERIN (NITROSTAT) 0.4 MG SL tablet PLACE 1 TABLET UNDER THE TONGUE EVERY 5 MINUTES AS NEEDED FOR CHEST PAIN 09/01/16  Yes Sherren Mocha, MD  Omega-3 Fatty Acids (FISH OIL) 1200 MG CAPS Take 2 capsules by mouth 2 (two) times daily.    Yes [provider]  pantoprazole (PROTONIX) 40 MG tablet Take 40 mg by mouth 2 (two) times daily.   Yes [provider]  traMADol (ULTRAM) 50 MG tablet Take  50 mg by mouth every 8 (eight) hours.  12/24/13  Yes [provider]    Inpatient Medications: Scheduled Meds: . amLODipine  5 mg Oral Daily  . aspirin EC  81 mg Oral QHS  . atorvastatin  40 mg Oral q1800  . clopidogrel  75 mg Oral q morning - 10a  . enoxaparin (LOVENOX) injection  40 mg Subcutaneous Q24H  . gabapentin  600 mg Oral TID  . levothyroxine  150 mcg Oral QAC breakfast  . lisinopril  5 mg Oral QHS  . metoprolol tartrate  25 mg Oral Daily  . multivitamin with minerals  1 tablet Oral QODAY  . niacin  500 mg Oral QPM  . nitroGLYCERIN  0.5 inch Topical Q6H  . pantoprazole  40 mg Oral BID  . traMADol  50 mg Oral Q8H   Continuous Infusions: . sodium chloride     PRN Meds: acetaminophen **OR** acetaminophen, ondansetron **OR** ondansetron (ZOFRAN) IV  Allergies:    Allergies  Allergen Reactions  . Codeine Nausea And Vomiting  . Pollen Extract Other (See Comments)    Seasonal     Social History:   Social History   Social History  . Marital status: Widowed    Spouse name: N/A  . Number of children: N/A  . Years of education: N/A   Occupational History  . semi-retired    Social History Main Topics  . Smoking status: Former Smoker    Years: 12.00    Types: Cigars  . Smokeless tobacco: Never Used     Comment: "quit smoking in ~ 1978"  . Alcohol use Yes     Comment: 04/10/2015 "I've drank 4 beers in the last 2 months"  . Drug use: No  . Sexual activity: Not Currently   Other Topics Concern  . Not on file   Social History Narrative  . No narrative on file    Family History:    Family History  Problem Relation Age of Onset  . Cancer Father 23       died  . Stroke Mother 59       died     ROS:  Please see the history of present illness.  Review of Systems  Constitution: Negative.  HENT: Negative.   Cardiovascular: Positive for chest pain and dyspnea on exertion.  Respiratory: Negative.   Endocrine: Negative.   Hematologic/Lymphatic: Negative.   Musculoskeletal: Negative.   Gastrointestinal: Positive for heartburn.  Genitourinary: Negative.   Neurological: Negative.     All other ROS reviewed and negative.     Physical Exam/Data:   Vitals:   09/08/16 0107 09/08/16 0556 09/08/16 0730 09/08/16 0901  BP: (!) 142/79 (!) 107/53 (!) 143/67 (!) 154/62  Pulse: 60 (!) 55 (!) 54 (!) 53  Resp: 18 14 12 18   Temp:    97.7 F (36.5 C)  TempSrc:    Oral  SpO2: 98% 98% 98% 98%  Weight:      Height:    6' (1.829 m)   No intake or output data in the 24 hours ending 09/08/16 0917 Filed Weights   09/07/16 2022  Weight: 213 lb (96.6 kg)   Body mass index is 28.89 kg/m.  General:  Well nourished, well developed, in no acute distress HEENT: normal Lymph: no adenopathy Neck: no JVD Endocrine:  No thryomegaly Vascular: bilateral carotid bruits vs murmur portrayed; FA pulses 2+ bilaterally without bruits  Cardiac:  normal S1, S2; RRR; 2/6  sys murmur RSB LSB Lungs:  clear to auscultation bilaterally, no wheezing, rhonchi or rales  Abd: soft, nontender, no hepatomegaly  Ext: no edema Musculoskeletal:  No deformities, BUE and BLE strength normal and equal Skin: warm and dry  Neuro:  CNs 2-12 intact, no focal abnormalities noted Psych:  Normal affect   EKG:  The EKG was personally reviewed and demonstrates:  NSR with IRBBB unchanged Telemetry:  Telemetry was personally reviewed and demonstrates:  NSR  Relevant CV Studies:  Cardiac cath 04/10/15 Conclusion   Mid RCA lesion, 100% stenosed.  RIMA .  Patent graft - free RIMA  Prox LAD lesion, 100% stenosed.  LIMA .  LIMA-LAD patent  Ost LM to LM lesion, 25% stenosed. The lesion was previously treated with a stent (unknown type).  Ost Ramus to Ramus lesion, 25% stenosed. The lesion was previously treated with a stent (unknown type).  Ost RPDA lesion, 100% stenosed. Post intervention, there is a 0% residual stenosis.  Dist RCA lesion, 99% stenosed. Post intervention, there is a 0% residual stenosis.   1. Severe native vessel CAD with total occlusion of the RCA, total occlusion of the LAD, and patency of the stented segment of the left main into the ramus.  2. Severe stenosis of the distal RCA treated successfully with PCI (drug-eluting stent) 3. Total occlusion of the PDA following distal RCA stenting, likely due to plaque shifting, treated successfully with PCI (DES), but with distal vessel occlusion possibly due to distal vessel dissection (pt asymptomatic)   Continue DAPT. Will transition to [place] tomorrow am. Pt would be a candidate for the Twilight Trial if he meets criteria.        Laboratory Data:  Chemistry Recent Labs Lab 09/07/16 2027 09/08/16 0551  NA 138 139  K 3.7 3.5  CL 105 105  CO2 24 27  GLUCOSE 165* 105*  BUN 29* 24*  CREATININE 1.37* 1.10  CALCIUM 8.8* 8.4*  GFRNONAA 50* >60  GFRAA 57* >60  ANIONGAP 9 7     Recent Labs Lab  09/08/16 0551  PROT 6.2*  ALBUMIN 3.6  AST 21  ALT 18  ALKPHOS 57  BILITOT 0.4   Hematology Recent Labs Lab 09/07/16 2027 09/08/16 0551  WBC 5.9 5.0  RBC 3.34* 3.20*  HGB 11.3* 10.6*  HCT 32.4* 31.1*  MCV 97.0 97.2  MCH 33.8 33.1  MCHC 34.9 34.1  RDW 13.3 13.2  PLT 160 150   Cardiac Enzymes Recent Labs Lab 09/07/16 2027 09/08/16 0042 09/08/16 0551  TROPONINI <0.03 <0.03 <0.03   No results for input(s): TROPIPOC in the last 168 hours.  BNPNo results for input(s): BNP, PROBNP in the last 168 hours.  DDimer No results for input(s): DDIMER in the last 168 hours.  Radiology/Studies:  Dg Chest Portable 1 View  Result Date: 09/07/2016 CLINICAL DATA:  Patient states mid chest pain/ tightness and SOB on Sunday that eased off but has come back today. Hx of coronary artery disease, HTN, CABG, coronary stent placement, heart cath, and former smoker. EXAM: PORTABLE CHEST 1 VIEW COMPARISON:  10/30/2013 FINDINGS: There stable changes from previous CABG surgery. Left coronary artery stent is also stable. The cardiac silhouette is normal in size. No mediastinal or hilar masses or evidence of adenopathy. Clear lungs.  No evidence of a pleural effusion.  No pneumothorax. Skeletal structures are intact. IMPRESSION: No acute cardiopulmonary disease. Electronically Signed   By: Lajean Manes M.D.   On: 09/07/2016 20:49    Assessment and Plan:   1.Unstable angina-begin IV heparin  and transfer to [facility] for cardiac cath. Patient agreeable.I have reviewed the risks, indications, and alternatives to angioplasty and stenting with the patient. Risks include but are not limited to bleeding, infection, vascular injury, stroke, myocardial infection, arrhythmia, kidney injury, radiation-related injury in the case of prolonged fluoroscopy use, emergency cardiac surgery, and death. The patient understands the risks of serious complication is low (<2%) and he agrees to proceed.  2.CAD has CAD and underwent  2v CABG with LIMA to LAD and RIMA to RCA 1997, hypertension, hyperlipidemia, hypothyroidism and chronic back pain. Underwent PCI in March 2017 after presenting with exertional angina and an abnormal nuclear scan. Treated with overlapping DES in the distal RCA through the graft to the RCA. Now with unstable angina as above  3. GERD with esophageal dilatation 11/2015  4. Renal insufficiency on admission Crt 1.37 but normal today 1.10. Replace potassium.  5. Chronic anemia  6. HTN well controlled  HLD on atorvastatin  Signed, Ermalinda Barrios, PA-C  09/08/2016 9:17 AM   Attending note:  Patient seen and examined. Reviewed records and discussed the case with Ms. Bonnell Public PA-C. [name] has a history of CAD status post CABG with LIMA to LAD and RIMA to RCA in 1997, multiple PCI to the ramus (stenting of left main into the ramus), and most recently DES intervention to the RCA and PDA in March 2017. He last saw Dr. Burt Knack in October 2017. Over the last few weeks he has been having increasing episodes of angina, using nitroglycerin occasionally with exertion, and within the last few days having more prolonged episodes of chest pain occurring more frequently and requiring more nitroglycerin. He states his symptoms are very consistent with previous angina and he reports no other changes in his medications.  Examination he appears comfortable, heart rate in the 50s to 60s in sinus rhythm by telemetry which I personally reviewed. Systolic blood pressure 671I to 150s. Lungs are clear without labored breathing at rest. Cardiac exam reveals RRR with 2/6 systolic murmur no gallop. Lab work shows potassium 3.5, creatinine 1.1, normal troponin I 3, hemoglobin 10.6, platelets 150. I personally reviewed his ECG from 09/07/2016 which showed sinus rhythm with incomplete right bundle branch block. Chest x-ray shows no acute cardiopulmonary abnormalities.  Patient presents with unstable angina, very consistent with  prior symptoms. Last coronary intervention was in March 2017 as detailed above. Troponin I normal and ECG without acute ST segment changes. Plan is to transfer to our cardiology service at [facility] in anticipation of a diagnostic cardiac catheterization with eye toward revascularization options. Heparin is being initiated. Risks and benefits discussed with the patient, and he is in agreement to proceed.  Satira Sark, M.D., F.A.C.C.

## 2016-09-08 NOTE — Progress Notes (Signed)
TR BAND REMOVAL  LOCATION:    left radial  DEFLATED PER PROTOCOL:    Yes.    TIME BAND OFF / DRESSING APPLIED:    21:30   SITE UPON ARRIVAL:    Level 0  SITE AFTER BAND REMOVAL:    Level 0  CIRCULATION SENSATION AND MOVEMENT:    Within Normal Limits   Yes.    COMMENTS:   Post TR band instructions given. Pt tolerated well.

## 2016-09-08 NOTE — Progress Notes (Signed)
Received pt from [facility] via [facility].  Pt alert and oriented X4, skin warm and dry , denies any chest discomfort at this time .  Pt put on monitor and consent signed per MD orders.   Pt able to ambulate to BR without any problems.

## 2016-09-08 NOTE — Care Management Obs Status (Signed)
[facility] NOTIFICATION   Patient Details  Name: NABOR THOMANN MRN: [id number] Date of Birth: 03/05/1945   Medicare Observation Status Notification Given:  Yes    Sherald Barge, RN 09/08/2016, 9:34 AM

## 2016-09-08 NOTE — Progress Notes (Signed)
ANTICOAGULATION CONSULT NOTE - Initial Consult  Pharmacy Consult for HEPARIN Indication: chest pain/ACS  Allergies  Allergen Reactions  . Codeine Nausea And Vomiting  . Pollen Extract Other (See Comments)    Seasonal   Patient Measurements: Height: 6' (182.9 cm) Weight: 213 lb (96.6 kg) IBW/kg (Calculated) : 77.6 HEPARIN DW (KG): 96.6  Vital Signs: Temp: 97.7 F (36.5 C) (08/29 0901) Temp Source: Oral (08/29 0901) BP: 154/62 (08/29 0901) Pulse Rate: 53 (08/29 0901)  Labs:  Recent Labs  09/07/16 2027 09/08/16 0042 09/08/16 0551  HGB 11.3*  --  10.6*  HCT 32.4*  --  31.1*  PLT 160  --  150  CREATININE 1.37*  --  1.10  TROPONINI <0.03 <0.03 <0.03   Estimated Creatinine Clearance: 72.1 mL/min (by C-G formula based on SCr of 1.1 mg/dL).  Medical History: Past Medical History:  Diagnosis Date  . Anginal pain ([place])   . Arthritis    "all over"  . Chronic lower back pain   . Coronary artery disease    a. s/p CABG x 2 (LIMA->LAD, RIMA->RCA);  b. s/p multiple PCI's to Ramus;  c. 08/2011 Cath/PCI: LM 70% into ramus with 70-80% there->treated wtih 3.5x18 Xience Xpedition DES, LCX  nonobs, RCA occluded.  RIMA & LIMA patent, EF 55-65%  . DJD (degenerative joint disease)   . GERD (gastroesophageal reflux disease)   . History of gout   . Hyperlipidemia   . Hypertension   . Hypothyroidism   . Neuropathy   . OSA on CPAP   . Prostate cancer ([place])    Medications:  Prescriptions Prior to Admission  Medication Sig Dispense Refill Last Dose  . amLODipine (NORVASC) 5 MG tablet TAKE 1 TABLET BY MOUTH EVERY DAY 90 tablet 1 09/07/2016 at Unknown time  . aspirin 81 MG tablet Take 1 tablet (81 mg total) by mouth at bedtime. 30 tablet  09/06/2016 at Unknown time  . atorvastatin (LIPITOR) 40 MG tablet TAKE 1 TABLET(40 MG) BY MOUTH DAILY AT 6 PM (Patient taking differently: TAKE 1 TABLET(40 MG) BY MOUTH DAILY) 30 tablet 7 09/07/2016 at Unknown time  . celecoxib (CELEBREX) 200 MG capsule  Take 1 capsule (200 mg total) by mouth daily.   09/07/2016 at Unknown time  . cholecalciferol (VITAMIN D) 1000 units tablet Take 1,000 Units by mouth 2 (two) times daily.    09/07/2016 at Unknown time  . clopidogrel (PLAVIX) 75 MG tablet Take 75 mg by mouth every morning.  30 tablet 11 09/07/2016 at Unknown time  . Coenzyme Q10 (COQ10) 200 MG CAPS Take 200 mg by mouth every evening.    09/07/2016 at Unknown time  . cyanocobalamin (,VITAMIN B-12,) 1000 MCG/ML injection Inject 1,000 mcg into the muscle every 30 (thirty) days.   2 08/29/2016 at Unknown time  . gabapentin (NEURONTIN) 600 MG tablet Take 600 mg by mouth 3 (three) times daily.  3 09/07/2016 at evening  . glucosamine-chondroitin 500-400 MG tablet Take 1 tablet by mouth daily.   09/07/2016 at Unknown time  . isosorbide mononitrate (IMDUR) 30 MG 24 hr tablet TAKE 1 TABLET(30 MG) BY MOUTH DAILY 90 tablet 1 09/07/2016 at Unknown time  . levothyroxine (SYNTHROID, LEVOTHROID) 150 MCG tablet Take 150 mcg by mouth daily before breakfast.   09/07/2016 at Unknown time  . lisinopril (PRINIVIL,ZESTRIL) 5 MG tablet TAKE 1 TABLET BY MOUTH EVERY NIGHT AT BEDTIME 90 tablet 2 09/06/2016 at Unknown time  . metoprolol tartrate (LOPRESSOR) 25 MG tablet Take 25 mg by mouth  daily.    09/07/2016 at [place]  . Multiple Vitamin (MULTIVITAMIN) tablet Take 1 tablet by mouth every other day.    Past Week at Unknown time  . niacin 500 MG tablet Take 500 mg by mouth every evening.   09/06/2016 at Unknown time  . nitroGLYCERIN (NITROSTAT) 0.4 MG SL tablet PLACE 1 TABLET UNDER THE TONGUE EVERY 5 MINUTES AS NEEDED FOR CHEST PAIN 25 tablet 3 09/07/2016 at Unknown time  . Omega-3 Fatty Acids (FISH OIL) 1200 MG CAPS Take 2 capsules by mouth 2 (two) times daily.    09/07/2016 at Unknown time  . pantoprazole (PROTONIX) 40 MG tablet Take 40 mg by mouth 2 (two) times daily.   09/07/2016 at Unknown time  . traMADol (ULTRAM) 50 MG tablet Take 50 mg by mouth every 8 (eight) hours.   1 09/07/2016 at  Unknown time   Assessment: 71yo male with unstable angina.  Asked to initiate Heparin with plans to transfer to [facility].    Goal of Therapy:  Heparin level 0.3-0.7 units/ml Monitor platelets by anticoagulation protocol: Yes   Plan:  Heparin 4000 units bolus IV now x 1 Heparin infusion at 14 units/Kg/Hr per Adj BW Heparin level in 6-8 hrs then daily CBC daily while on Heparin  [place] Crane, Monie Shere A 09/08/2016,10:14 AM

## 2016-09-09 ENCOUNTER — Encounter (HOSPITAL_COMMUNITY): Payer: Self-pay | Admitting: Cardiology

## 2016-09-09 DIAGNOSIS — I200 Unstable angina: Secondary | ICD-10-CM | POA: Diagnosis not present

## 2016-09-09 DIAGNOSIS — Z951 Presence of aortocoronary bypass graft: Secondary | ICD-10-CM | POA: Diagnosis not present

## 2016-09-09 DIAGNOSIS — E785 Hyperlipidemia, unspecified: Secondary | ICD-10-CM | POA: Diagnosis not present

## 2016-09-09 DIAGNOSIS — I25110 Atherosclerotic heart disease of native coronary artery with unstable angina pectoris: Secondary | ICD-10-CM | POA: Diagnosis not present

## 2016-09-09 DIAGNOSIS — E039 Hypothyroidism, unspecified: Secondary | ICD-10-CM | POA: Diagnosis not present

## 2016-09-09 DIAGNOSIS — I10 Essential (primary) hypertension: Secondary | ICD-10-CM | POA: Diagnosis not present

## 2016-09-09 LAB — CBC
HCT: 33.4 % — ABNORMAL LOW (ref 39.0–52.0)
HEMOGLOBIN: 10.8 g/dL — AB (ref 13.0–17.0)
MCH: 31.3 pg (ref 26.0–34.0)
MCHC: 32.3 g/dL (ref 30.0–36.0)
MCV: 96.8 fL (ref 78.0–100.0)
Platelets: 142 10*3/uL — ABNORMAL LOW (ref 150–400)
RBC: 3.45 MIL/uL — ABNORMAL LOW (ref 4.22–5.81)
RDW: 13.6 % (ref 11.5–15.5)
WBC: 4.4 10*3/uL (ref 4.0–10.5)

## 2016-09-09 MED ORDER — ISOSORBIDE MONONITRATE ER 60 MG PO TB24
60.0000 mg | ORAL_TABLET | Freq: Every day | ORAL | Status: DC
  Administered 2016-09-09: 60 mg via ORAL
  Filled 2016-09-09: qty 1

## 2016-09-09 MED ORDER — ISOSORBIDE MONONITRATE ER 60 MG PO TB24: 60.0000 mg | ORAL_TABLET | Freq: Every day | ORAL | 5 refills | Status: DC

## 2016-09-09 NOTE — Progress Notes (Signed)
Progress Note  Patient Name: [name] Date of Encounter: 09/09/2016  Primary Cardiologist: Dr Burt Knack  Subjective   Complains of chest pain with ambulation; no dyspnea; feels reflux and indigestion.  Inpatient Medications    Scheduled Meds: . amLODipine  5 mg Oral Daily  . aspirin EC  81 mg Oral QHS  . atorvastatin  40 mg Oral q1800  . clopidogrel  75 mg Oral q morning - 10a  . gabapentin  600 mg Oral TID  . heparin  5,000 Units Subcutaneous Q8H  . levothyroxine  150 mcg Oral QAC breakfast  . lisinopril  5 mg Oral QHS  . metoprolol tartrate  25 mg Oral Daily  . multivitamin with minerals  1 tablet Oral QODAY  . niacin  500 mg Oral QPM  . pantoprazole  40 mg Oral BID  . sodium chloride flush  3 mL Intravenous Q12H  . traMADol  50 mg Oral Q8H   Continuous Infusions: . sodium chloride    . sodium chloride     PRN Meds: sodium chloride, acetaminophen **OR** acetaminophen, ondansetron **OR** ondansetron (ZOFRAN) IV, sodium chloride flush   Vital Signs    Vitals:   09/09/16 0740 09/09/16 0741 09/09/16 0800 09/09/16 0805  BP: (!) 138/38  (!) 179/52 (!) 156/64  Pulse:  64    Resp: 17  (!) 23 16  Temp:  98 F (36.7 C)    TempSrc:  Oral    SpO2:  98%    Weight:      Height:        Intake/Output Summary (Last 24 hours) at 09/09/16 0945 Last data filed at 09/09/16 0742  Gross per 24 hour  Intake          1395.54 ml  Output              900 ml  Net           495.54 ml   Filed Weights   09/07/16 2022 09/08/16 1915 09/09/16 0401  Weight: 96.6 kg (213 lb) 96.6 kg (212 lb 15.4 oz) 96.6 kg (212 lb 15.4 oz)    Telemetry    Sinus bradycardia- Personally Reviewed   Physical Exam   GEN: No acute distress.   Neck: No JVD Cardiac: RRR, no murmurs, rubs, or gallops.  Respiratory: Clear to auscultation bilaterally. GI: Soft, nontender, non-distended  MS: No edema; No deformity. Radial cath site with no hematoma Neuro:  Nonfocal  Psych: Normal affect    Labs    Chemistry Recent Labs Lab 09/07/16 2027 09/08/16 0551 09/08/16 1956  NA 138 139  --   K 3.7 3.5  --   CL 105 105  --   CO2 24 27  --   GLUCOSE 165* 105*  --   BUN 29* 24*  --   CREATININE 1.37* 1.10 1.08  CALCIUM 8.8* 8.4*  --   PROT  --  6.2*  --   ALBUMIN  --  3.6  --   AST  --  21  --   ALT  --  18  --   ALKPHOS  --  57  --   BILITOT  --  0.4  --   GFRNONAA 50* >60 >60  GFRAA 57* >60 >60  ANIONGAP 9 7  --      Hematology Recent Labs Lab 09/08/16 0551 09/08/16 1956 09/09/16 0412  WBC 5.0 5.9 4.4  RBC 3.20* 3.78* 3.45*  HGB 10.6* 12.4* 10.8*  HCT 31.1*  36.4* 33.4*  MCV 97.2 96.3 96.8  MCH 33.1 32.8 31.3  MCHC 34.1 34.1 32.3  RDW 13.2 13.7 13.6  PLT 150 147* 142*    Cardiac Enzymes Recent Labs Lab 09/07/16 2027 09/08/16 0042 09/08/16 0551 09/08/16 1202  TROPONINI <0.03 <0.03 <0.03 <0.03    Radiology    Dg Chest Portable 1 View  Result Date: 09/07/2016 CLINICAL DATA:  Patient states mid chest pain/ tightness and SOB on Sunday that eased off but has come back today. Hx of coronary artery disease, HTN, CABG, coronary stent placement, heart cath, and former smoker. EXAM: PORTABLE CHEST 1 VIEW COMPARISON:  10/30/2013 FINDINGS: There stable changes from previous CABG surgery. Left coronary artery stent is also stable. The cardiac silhouette is normal in size. No mediastinal or hilar masses or evidence of adenopathy. Clear lungs.  No evidence of a pleural effusion.  No pneumothorax. Skeletal structures are intact. IMPRESSION: No acute cardiopulmonary disease. Electronically Signed   By: David  Ormond M.D.   On: 09/07/2016 20:49    Patient Profile     71  y.o. male past medical history of coronary artery disease status post coronary artery bypass and graft admitted with exertional chest discomfort. Cardiac catheterization reveals patent grafts to the LAD and right coronary artery. Plan is medical therapy.  Assessment & Plan    1 chest pain-patient  symptoms are concerning for unstable angina. However catheterization reveals patent grafts. He had more chest pain this morning with exertion relieved with rest. I will add isosorbide 60 mg daily. He also complains of increased belching and reflux symptoms. Continue protonix. Ambulate today and discharge tomorrow morning if symptoms improved.  2 coronary artery disease-continue aspirin and statin.  3 hypertension-blood pressure mildly elevated. I am adding isosorbide for angina. He should also help with blood pressure. We will follow and adjust regimen as needed.  4 hyperlipidemia-continue statin.   Signed, Kirk Ruths, MD  09/09/2016, 9:45 AM

## 2016-09-09 NOTE — Discharge Summary (Signed)
Discharge Summary    Patient ID: [name],  MRN: [id number], DOB/AGE: 06/17/1945 71 y.o.  Admit date: 09/07/2016 Discharge date: 09/09/2016  Primary Care Provider: Sharilyn Sites Primary Cardiologist: Dr. Burt Knack  Discharge Diagnoses    Principal Problem:   Unstable angina [facility]) Active Problems:   Essential hypertension   Coronary artery disease   Chest pain   Allergies Allergies  Allergen Reactions  . Codeine Nausea And Vomiting  . Pollen Extract Other (See Comments)    Seasonal    Diagnostic Studies/Procedures    Cardiac cath 04/10/15 Conclusion   Mid RCA lesion, 100% stenosed.  RIMA .  Patent graft - free RIMA  Prox LAD lesion, 100% stenosed.  LIMA .  LIMA-LAD patent  Ost LM to LM lesion, 25% stenosed. The lesion was previously treated with a stent (unknown type).  Ost Ramus to Ramus lesion, 25% stenosed. The lesion was previously treated with a stent (unknown type).  Ost RPDA lesion, 100% stenosed. Post intervention, there is a 0% residual stenosis.  Dist RCA lesion, 99% stenosed. Post intervention, there is a 0% residual stenosis.  1. Severe native vessel CAD with total occlusion of the RCA, total occlusion of the LAD, and patency of the stented segment of the left main into the ramus.  2. Severe stenosis of the distal RCA treated successfully with PCI (drug-eluting stent) 3. Total occlusion of the PDA following distal RCA stenting, likely due to plaque shifting, treated successfully with PCI (DES), but with distal vessel occlusion possibly due to distal vessel dissection (pt asymptomatic)  Continue DAPT. Will transition to [place] tomorrow am. Pt would be a candidate for the [facility] Trial if he meets criteria.     _____________   History of Present Illness     [name] has a history of CAD and underwent 2v CABG with LIMA to LAD and RIMA to RCA 1997, hypertension, hyperlipidemia, hypothyroidism and chronic back pain. Underwent  PCI in March 2017 after presenting with exertional angina and an abnormal nuclear scan. Treated with overlapping DES in the distal RCA through the graft to the RCA. History of GERD with dysphagia s/p esophageal dilatation 11/2015, anemia with B12 shots.  Patient had exertional chest tightness while walking last week and again Sunday night eased with rest. Yest walked on treadmill at gym and became short of breath so stopped. 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 Developed chest tightness relieved with 2 NTG but returned quickly while at rest so came to ED relieved with NTG paste. Similar to prior angina. Has done well since last stent and last saw Dr. Burt Knack 10/2015. Also complains of increased gas and belching last night. Troponins negative x 3 EKG without acute change.  Hospital Course     Consultants: none  1 chest pain-patient symptoms are concerning for unstable angina. However catheterization reveals patent grafts. He had more chest pain this morning with exertion relieved with rest. I will increase isosorbide to 60 mg daily. He also complains of increased belching and reflux symptoms. Continue protonix. Since Imdur pt has been up ambulating without any chest pain. He feels well and wants to go home.   Left wrist cath site without hematoma.  2 coronary artery disease-continue aspirin and statin.  3 hypertension-blood pressure mildly elevated.  Isosorbide added for angina. This should also help with blood pressure. We will follow and adjust regimen as needed.  4 hyperlipidemia-continue statin.  Patient has been seen by Dr. Stanford Breed  today and deemed ready for discharge home. All follow up appointments have been scheduled. Discharge medications are listed below.  SCr 1.08 on 8/29 Hgb 10.8 on 8/30 _____________  Discharge Vitals Blood pressure (!) 109/55, pulse 60, temperature 98 F (36.7 C), temperature source Oral, resp. rate 16, height 6' (1.829 m), weight 212 lb  15.4 oz (96.6 kg), SpO2 98 %.  Filed Weights   09/07/16 2022 09/08/16 1915 09/09/16 0401  Weight: 213 lb (96.6 kg) 212 lb 15.4 oz (96.6 kg) 212 lb 15.4 oz (96.6 kg)    Labs & Radiologic Studies    CBC  Recent Labs  09/08/16 1956 09/09/16 0412  WBC 5.9 4.4  HGB 12.4* 10.8*  HCT 36.4* 33.4*  MCV 96.3 96.8  PLT 147* 585*   Basic Metabolic Panel  Recent Labs  09/07/16 2027 09/08/16 0551 09/08/16 1956  NA 138 139  --   K 3.7 3.5  --   CL 105 105  --   CO2 24 27  --   GLUCOSE 165* 105*  --   BUN 29* 24*  --   CREATININE 1.37* 1.10 1.08  CALCIUM 8.8* 8.4*  --    Liver Function Tests  Recent Labs  09/08/16 0551  AST 21  ALT 18  ALKPHOS 57  BILITOT 0.4  PROT 6.2*  ALBUMIN 3.6   No results for input(s): LIPASE, AMYLASE in the last 72 hours. Cardiac Enzymes  Recent Labs  09/08/16 0042 09/08/16 0551 09/08/16 1202  TROPONINI <0.03 <0.03 <0.03   BNP Invalid input(s): POCBNP D-Dimer No results for input(s): DDIMER in the last 72 hours. Hemoglobin A1C No results for input(s): HGBA1C in the last 72 hours. Fasting Lipid Panel No results for input(s): CHOL, HDL, LDLCALC, TRIG, CHOLHDL, LDLDIRECT in the last 72 hours. Thyroid Function Tests No results for input(s): TSH, T4TOTAL, T3FREE, THYROIDAB in the last 72 hours.  Invalid input(s): FREET3 _____________  Dg Chest Portable 1 View  Result Date: 09/07/2016 CLINICAL DATA:  Patient states mid chest pain/ tightness and SOB on Sunday that eased off but has come back today. Hx of coronary artery disease, HTN, CABG, coronary stent placement, heart cath, and former smoker. EXAM: PORTABLE CHEST 1 VIEW COMPARISON:  10/30/2013 FINDINGS: There stable changes from previous CABG surgery. Left coronary artery stent is also stable. The cardiac silhouette is normal in size. No mediastinal or hilar masses or evidence of adenopathy. Clear lungs.  No evidence of a pleural effusion.  No pneumothorax. Skeletal structures are intact.  IMPRESSION: No acute cardiopulmonary disease. Electronically Signed   By: Lajean Manes M.D.   On: 09/07/2016 20:49   Disposition   Pt is being discharged home today in good condition.  Follow-up Plans & Appointments    Follow-up Information    [facility] Follow up.   Specialty:  Cardiology Why:  Dr. Antionette Char office will call you to schedule follow up appointment.  Contact information: [address], [place] [phone]         Discharge Instructions    Diet - low sodium heart healthy    Complete by:  As directed    Discharge instructions    Complete by:  As directed    [place].  PLEASE ATTEND ALL SCHEDULED FOLLOW-UP APPOINTMENTS.   Activity: Increase activity slowly as tolerated. You may shower, but no soaking baths (or swimming) for 1 week.  No driving for 24 hours. No lifting over 5 lbs for 1 week. No sexual activity for 1 week.   You May Return to Work: in 1 week (if applicable)  Wound Care: You may wash cath site gently with soap and water. Keep cath site clean and dry. If you notice pain, swelling, bleeding or pus at your cath site, please call [phone].   Increase activity slowly    Complete by:  As directed       Discharge Medications   Current Discharge Medication List    CONTINUE these medications which have CHANGED   Details  isosorbide mononitrate (IMDUR) 60 MG 24 hr tablet Take 1 tablet (60 mg total) by mouth daily. Qty: 30 tablet, Refills: 5      CONTINUE these medications which have NOT CHANGED   Details  amLODipine (NORVASC) 5 MG tablet TAKE 1 TABLET BY MOUTH EVERY DAY Qty: 90 tablet, Refills: 1    aspirin 81 MG tablet Take 1 tablet (81 mg total) by mouth at bedtime. Qty: 30 tablet    atorvastatin (LIPITOR) 40 MG tablet TAKE 1 TABLET(40 MG) BY MOUTH DAILY AT 6 PM Qty: 30 tablet, Refills: 7      celecoxib (CELEBREX) 200 MG capsule Take 1 capsule (200 mg total) by mouth daily.   Associated Diagnoses: Coronary artery disease; Aortic stenosis; Other and unspecified hyperlipidemia; Unspecified essential hypertension    cholecalciferol (VITAMIN D) 1000 units tablet Take 1,000 Units by mouth 2 (two) times daily.     clopidogrel (PLAVIX) 75 MG tablet Take 75 mg by mouth every morning.  Qty: 30 tablet, Refills: 11    Coenzyme Q10 (COQ10) 200 MG CAPS Take 200 mg by mouth every evening.     cyanocobalamin (,VITAMIN B-12,) 1000 MCG/ML injection Inject 1,000 mcg into the muscle every 30 (thirty) days.  Refills: 2    gabapentin (NEURONTIN) 600 MG tablet Take 600 mg by mouth 3 (three) times daily. Refills: 3    glucosamine-chondroitin 500-400 MG tablet Take 1 tablet by mouth daily.    levothyroxine (SYNTHROID, LEVOTHROID) 150 MCG tablet Take 150 mcg by mouth daily before breakfast.    lisinopril (PRINIVIL,ZESTRIL) 5 MG tablet TAKE 1 TABLET BY MOUTH EVERY NIGHT AT BEDTIME Qty: 90 tablet, Refills: 2    metoprolol tartrate (LOPRESSOR) 25 MG tablet Take 25 mg by mouth daily.     Multiple Vitamin (MULTIVITAMIN) tablet Take 1 tablet by mouth every other day.     niacin 500 MG tablet Take 500 mg by mouth every evening.    nitroGLYCERIN (NITROSTAT) 0.4 MG SL tablet PLACE 1 TABLET UNDER THE TONGUE EVERY 5 MINUTES AS NEEDED FOR CHEST PAIN Qty: 25 tablet, Refills: 3   Associated Diagnoses: Murmur; Essential hypertension; Unstable angina (HCC)    Omega-3 Fatty Acids (FISH OIL) 1200 MG CAPS Take 2 capsules by mouth 2 (two) times daily.     pantoprazole (PROTONIX) 40 MG tablet Take 40 mg by mouth 2 (two) times daily.    traMADol (ULTRAM) 50 MG tablet Take 50 mg by mouth every 8 (eight) hours.  Refills: 1         Outstanding Labs/Studies   none  Duration of Discharge Encounter   Greater than 30 minutes including physician time.  Signed, Daune Perch NP 09/09/2016, 2:34 PM

## 2016-09-09 NOTE — Progress Notes (Signed)
Patient complained of chest pain 4-5/10, described as pressure and dull pain, shortness of breath and numbness and tingling anterior arms bilaterally. No nausea, slight dizziness which is usual for this patient. BP 179/52 at 758. 12 lead ECG obtained at 804. 806 BP 156/64 and patient reported chest pain resolved. 0810 patient stated he felt fine with no symptoms. Notified provider.

## 2016-09-09 NOTE — Care Management Note (Addendum)
Case Management Note  Patient Details  Name: [name] MRN: [id number] Date of Birth: 10-02-1945  Subjective/Objective:    From home, s/p heart cath/angiography. Will be on plavix.   Action/Plan: NCM will follow for dc needs.  Expected Discharge Date:  09/11/16               Expected Discharge Plan:  Home/Self Care  In-House Referral:     Discharge planning Services  CM Consult  Post Acute Care Choice:    Choice offered to:     DME Arranged:    DME Agency:     HH Arranged:    HH Agency:     Status of Service:  In process, will continue to follow  If discussed at Long Length of Stay Meetings, dates discussed:    Additional Comments:  Zenon Mayo, RN 09/09/2016, 7:43 AM

## 2016-09-10 DIAGNOSIS — R319 Hematuria, unspecified: Secondary | ICD-10-CM | POA: Diagnosis not present

## 2016-09-10 DIAGNOSIS — I2510 Atherosclerotic heart disease of native coronary artery without angina pectoris: Secondary | ICD-10-CM | POA: Diagnosis not present

## 2016-09-10 DIAGNOSIS — I10 Essential (primary) hypertension: Secondary | ICD-10-CM | POA: Diagnosis not present

## 2016-09-10 DIAGNOSIS — N390 Urinary tract infection, site not specified: Secondary | ICD-10-CM | POA: Diagnosis not present

## 2016-09-23 ENCOUNTER — Other Ambulatory Visit: Payer: Self-pay | Admitting: Cardiovascular Disease

## 2016-09-23 DIAGNOSIS — R310 Gross hematuria: Secondary | ICD-10-CM | POA: Diagnosis not present

## 2016-09-23 DIAGNOSIS — R319 Hematuria, unspecified: Secondary | ICD-10-CM | POA: Diagnosis not present

## 2016-09-23 DIAGNOSIS — N281 Cyst of kidney, acquired: Secondary | ICD-10-CM | POA: Diagnosis not present

## 2016-09-23 DIAGNOSIS — M47815 Spondylosis without myelopathy or radiculopathy, thoracolumbar region: Secondary | ICD-10-CM | POA: Diagnosis not present

## 2016-09-29 ENCOUNTER — Encounter: Payer: Self-pay | Admitting: Cardiovascular Disease

## 2016-09-29 ENCOUNTER — Ambulatory Visit (INDEPENDENT_AMBULATORY_CARE_PROVIDER_SITE_OTHER): Payer: Medicare Other | Admitting: Cardiovascular Disease

## 2016-09-29 VITALS — BP 100/60 | HR 77 | Ht 72.0 in | Wt 212.1 lb

## 2016-09-29 DIAGNOSIS — E785 Hyperlipidemia, unspecified: Secondary | ICD-10-CM | POA: Diagnosis not present

## 2016-09-29 DIAGNOSIS — I25118 Atherosclerotic heart disease of native coronary artery with other forms of angina pectoris: Secondary | ICD-10-CM

## 2016-09-29 MED ORDER — ISOSORBIDE MONONITRATE ER 30 MG PO TB24: 30.0000 mg | ORAL_TABLET | Freq: Every day | ORAL | 3 refills | Status: DC

## 2016-09-29 NOTE — Patient Instructions (Signed)
Medication Instructions:  1) DECREASE IMDUR to 30 mg daily  Labwork: None  Testing/Procedures: None  Follow-Up: You have been referred to [facility]. You will be called to arrange sessions.  Your provider wants you to follow-up in: 1 year with Dr. Burt Knack. You will receive a reminder letter in the mail two months in advance. If you don't receive a letter, please call our office to schedule the follow-up appointment.    Any Other Special Instructions Will Be Listed Below (If Applicable).     If you need a refill on your cardiac medications before your next appointment, please call your pharmacy.

## 2016-09-29 NOTE — Progress Notes (Signed)
Cardiology Office Note Date:  09/29/2016   ID:  [name], [name] Mack, MRN [id number]  PCP:  Sharilyn Sites, MD  Cardiologist:  Sherren Mocha, MD    Chief Complaint  Patient presents with  . Chest Pain     History of Present Illness: [name] is a 71 y.o. male who presents for follow-up evaluation. He has CAD and underwent 2v CABG with LIMA to LAD and RIMA to RCA 1997, hypertension, hyperlipidemia, hypothyroidism and chronic back pain. Underwent PCI in March 2017 after presenting with exertional angina and an abnormal nuclear scan. Treated with overlapping DES in the distal RCA through the graft to the RCA. He recently underwent repeat cardiac catheterization after presenting with chest pain and was found by cath to have patency at all of his stent sites.   The patient is here alone today. He's doing fairly well. He still has occasional chest discomfort that is not typically brought on by exertion. No shortness of breath, edema, palpitations. He complains of fatigue and mild dizziness that occurs most afternoons. This is worse since he was hospitalized last month. At that time his isosorbide was increased from 30-60 mg daily. No other medicine changes were made.  Past Medical History:  Diagnosis Date  . Anginal pain ([place])   . Arthritis    "all over"  . Chronic lower back pain   . Coronary artery disease    a. s/p CABG x 2 (LIMA->LAD, RIMA->RCA);  b. s/p multiple PCI's to Ramus;  c. 08/2011 Cath/PCI: LM 70% into ramus with 70-80% there->treated wtih 3.5x18 Xience Xpedition DES, LCX  nonobs, RCA occluded.  RIMA & LIMA patent, EF 55-65%  . DJD (degenerative joint disease)   . GERD (gastroesophageal reflux disease)   . History of gout   . Hyperlipidemia   . Hypertension   . Hypothyroidism   . Neuropathy   . OSA on CPAP   . Prostate cancer [facility])     Past Surgical History:  Procedure Laterality Date  . ABDOMINAL HERNIA REPAIR    . BIOPSY  11/26/2015   Procedure: BIOPSY;  Surgeon: Rogene Houston, MD;  [facility];  Service: Endoscopy;;  gastric esophagus  . CARDIAC CATHETERIZATION N/A 04/10/2015   Procedure: Left Heart Cath and Cors/Grafts Angiography;  Surgeon: Sherren Mocha, MD;  Location: [place] CV LAB;  Service: Cardiovascular;  Laterality: N/A;  . CARDIAC CATHETERIZATION N/A 04/10/2015   Procedure: Coronary Stent Intervention;  Surgeon: Sherren Mocha, MD;  Location: [place] CV LAB;  Service: Cardiovascular;  Laterality: N/A;  . CATARACT EXTRACTION W/PHACO Right 06/23/2015   Procedure: CATARACT EXTRACTION PHACO AND INTRAOCULAR LENS PLACEMENT RIGHT EYE; CDE:  7.08;  Surgeon: Tonny Branch, MD;  Location: [facility] ORS;  Service: Ophthalmology;  Laterality: Right;  . CATARACT EXTRACTION W/PHACO Left 08/25/2015   Procedure: CATARACT EXTRACTION PHACO AND INTRAOCULAR LENS PLACEMENT LEFT EYE; CDE:  8.18;  Surgeon: Tonny Branch, MD;  Location: [facility] OR[facility];  Service: Ophthalmology;  Laterality: Left;  . COLONOSCOPY N/A 12/28/2012   Procedure: COLONOSCOPY;  Surgeon: Rogene Houston, MD;  [facility];  Service: Endoscopy;  Laterality: N/A;  830 rescheduled  . CORONARY ANGIOPLASTY WITH STENT PLACEMENT  2013; 04/10/2015   "this makes me a total of 7" (04/10/2015)  . CORONARY ARTERY BYPASS GRAFT  1997   with (LIMA)  . ESOPHAGEAL DILATION N/A 11/26/2015   Procedure: ESOPHAGEAL DILATION;  Surgeon: Rogene Houston, MD;  [facility];  Service: Endoscopy;  Laterality: N/A;  . ESOPHAGOGASTRODUODENOSCOPY N/A 11/26/2015   Procedure: ESOPHAGOGASTRODUODENOSCOPY (EGD);  Surgeon: Rogene Houston, MD;  [facility];  Service: Endoscopy;  Laterality: N/A;  1:25  . HERNIA REPAIR Left   . KNEE CARTILAGE SURGERY Bilateral   . LEFT HEART CATH AND CORS/GRAFTS ANGIOGRAPHY N/A 09/08/2016   Procedure: LEFT HEART CATH AND CORS/GRAFTS ANGIOGRAPHY;  Surgeon: Martinique, Peter M, MD;  Location: [place] CV LAB;  Service: Cardiovascular;   Laterality: N/A;  . LEFT HEART CATHETERIZATION WITH CORONARY ANGIOGRAM N/A 09/08/2011   Procedure: LEFT HEART CATHETERIZATION WITH CORONARY ANGIOGRAM;  Surgeon: Sherren Mocha, MD;  Location: [facility] CATH LAB;  Service: Cardiovascular;  Laterality: N/A;  . PERCUTANEOUS CORONARY STENT INTERVENTION (PCI-S) Right 09/08/2011   Procedure: PERCUTANEOUS CORONARY STENT INTERVENTION (PCI-S);  Surgeon: Sherren Mocha, MD;  Location: [facility] CATH LAB;  Service: Cardiovascular;  Laterality: Right;  . ROBOT ASSISTED LAPAROSCOPIC RADICAL PROSTATECTOMY      Current Outpatient Prescriptions  Medication Sig Dispense Refill  . aspirin 81 MG tablet Take 1 tablet (81 mg total) by mouth at bedtime. 30 tablet   . atorvastatin (LIPITOR) 40 MG tablet TAKE 1 TABLET(40 MG) BY MOUTH DAILY AT 6 PM (Patient taking differently: TAKE 1 TABLET(40 MG) BY MOUTH DAILY) 30 tablet 7  . celecoxib (CELEBREX) 200 MG capsule Take 1 capsule (200 mg total) by mouth daily.    . cholecalciferol (VITAMIN D) 1000 units tablet Take 1,000 Units by mouth 2 (two) times daily.     . clopidogrel (PLAVIX) 75 MG tablet Take 75 mg by mouth every morning.  30 tablet 11  . Coenzyme Q10 (COQ10) 200 MG CAPS Take 200 mg by mouth every evening.     . cyanocobalamin (,VITAMIN B-12,) 1000 MCG/ML injection Inject 1,000 mcg into the muscle every 30 (thirty) days.   2  . gabapentin (NEURONTIN) 600 MG tablet Take 600 mg by mouth 3 (three) times daily.  3  . glucosamine-chondroitin 500-400 MG tablet Take 1 tablet by mouth daily.    . isosorbide mononitrate (IMDUR) 30 MG 24 hr tablet Take 1 tablet (30 mg total) by mouth daily. 90 tablet 3  . levothyroxine (SYNTHROID, LEVOTHROID) 150 MCG tablet Take 150 mcg by mouth daily before breakfast.    . lisinopril (PRINIVIL,ZESTRIL) 5 MG tablet TAKE 1 TABLET BY MOUTH EVERY NIGHT AT BEDTIME 90 tablet 2  . metoprolol tartrate (LOPRESSOR) 25 MG tablet Take 25 mg by mouth daily.     . Multiple Vitamin (MULTIVITAMIN) tablet Take 1 tablet  by mouth every other day.     . niacin 500 MG tablet Take 500 mg by mouth every evening.    . nitroGLYCERIN (NITROSTAT) 0.4 MG SL tablet PLACE 1 TABLET UNDER THE TONGUE EVERY 5 MINUTES AS NEEDED FOR CHEST PAIN 25 tablet 3  . Omega-3 Fatty Acids (FISH OIL) 1200 MG CAPS Take 2 capsules by mouth 2 (two) times daily.     . pantoprazole (PROTONIX) 40 MG tablet Take 40 mg by mouth 2 (two) times daily.    . traMADol (ULTRAM) 50 MG tablet Take 50 mg by mouth every 8 (eight) hours.   1  . amLODipine (NORVASC) 5 MG tablet TAKE 1 TABLET BY MOUTH EVERY DAY (Patient not taking: Reported on 09/29/2016) 90 tablet 1   No current facility-administered medications for this visit.     Allergies:   Codeine and Pollen extract   Social History:  The patient  reports that he has quit smoking. His smoking use included  Cigars. He quit after 12.00 years of use. He has never used smokeless tobacco. He reports that he drinks alcohol. He reports that he does not use drugs.   Family History:  The patient's  family history includes Cancer (age of onset: 51) in his father; Stroke (age of onset: 75) in his mother.   ROS:  Please see the history of present illness.  All other systems are reviewed and negative.   PHYSICAL EXAM: VS:  BP 100/60   Pulse 77   Ht 6' (1.829 m)   Wt 96.2 kg (212 lb 1.9 oz)   SpO2 94%   BMI 28.77 kg/m  , BMI Body mass index is 28.77 kg/m. GEN: Well nourished, well developed, in no acute distress  HEENT: normal  Neck: no JVD, no masses. Bilateral carotid bruits Cardiac: RRR with 2/6 SEM at the RUSB               Respiratory:  clear to auscultation bilaterally, normal work of breathing GI: soft, nontender, nondistended, + BS MS: no deformity or atrophy  Ext: no pretibial edema, pedal pulses 2+= bilaterally Skin: warm and dry, no rash Neuro:  Strength and sensation are intact Psych: euthymic mood, full affect  EKG:  EKG is not ordered today.  Recent Labs: 09/08/2016: ALT 18; BUN 24;  Creatinine, Ser 1.08; Potassium 3.5; Sodium 139 09/09/2016: Hemoglobin 10.8; Platelets 142   Lipid Panel  No results found for: CHOL, TRIG, HDL, CHOLHDL, VLDL, LDLCALC, LDLDIRECT    Wt Readings from Last 3 Encounters:  09/29/16 96.2 kg (212 lb 1.9 oz)  09/09/16 96.6 kg (212 lb 15.4 oz)  11/26/15 100.7 kg (222 lb)     Cardiac Studies Reviewed: Cardiac Cath 09-08-2016: Conclusion     Mid RCA lesion, 100 %stenosed.  RIMA.  Dist RCA lesion, 0 %stenosed.  A drug eluting .  Ost RPDA lesion, 0 %stenosed.  A drug eluting .  Prox LAD lesion, 100 %stenosed.  LIMA.  Ost LM to LM lesion, 25 %stenosed.  Ost Ramus to Ramus lesion, 25 %stenosed.  The left ventricular systolic function is normal.  LV end diastolic pressure is mildly elevated.  The left ventricular ejection fraction is 55-65% by visual estimate.   1. 2 vessel occlusive CAD 2. Patent stents in the left main and large ramus intermediate branch 3. Patent stents in the distal RCA 4. Patent free RIMA to the distal RCA 5. Patent LIMA to the LAD 6. Normal LV function 7. Mildly elevated LVEDP.   Indications   Angina pectoris ([place]) [I20.9 (ICD-10-CM)]  Procedural Details/Technique   Technical Details Indication: 71 yo WM with history of CAD s/p CABG and multiple coronary interventions presents with chest pain.  Procedural Details: The left wrist was prepped, draped, and anesthetized with 1% lidocaine. Using the modified Seldinger technique, a 6 French slender sheath was introduced into the left radial artery. 3 mg of verapamil was administered through the sheath, weight-based unfractionated heparin was administered intravenously. Standard Judkins catheters were used for selective coronary and graft angiography and left ventriculography. Catheter exchanges were performed over an exchange length guidewire. There were no immediate procedural complications. A TR band was used for radial hemostasis at the completion of  the procedure. The patient was transferred to the post catheterization recovery area for further monitoring.  Contrast: 115 cc   Estimated blood loss <50 mL.  During this procedure the patient was administered the following to achieve and maintain moderate conscious sedation: Versed 1 mg, Fentanyl 25  mcg, while the patient's heart rate, blood pressure, and oxygen saturation were continuously monitored. The period of conscious sedation was 30 minutes, of which I was present face-to-face 100% of this time.    Complications   Complications documented before study signed (09/08/2016 6:26 PM EDT)    No complications were associated with this study.  Documented by Martinique, Peter M, MD - 09/08/2016 6:12 PM EDT    Coronary Findings   Dominance: Right  Left Main  Ost LM to LM lesion, 25% stenosed. The lesion was previously treated.  Left Anterior Descending  Prox LAD lesion, 100% stenosed.  Ramus Intermedius  Ost Ramus to Ramus lesion, 25% stenosed. The lesion was previously treated.  Right Coronary Artery  Mid RCA lesion, 100% stenosed.  Dist RCA lesion, 0% stenosed. Dist RCA lesion with no stenosis was previously treated.  Right Posterior Descending Artery  Ost RPDA lesion, 0% stenosed. Ost RPDA lesion with no stenosis was previously treated.  Graft Angiography  RIMA Graft to Mid RCA  RIMA. Patent graft - free RIMA  Free LIMA Graft to Mid LAD  LIMA. LIMA-LAD patent  Wall Motion              Left Heart   Left Ventricle The left ventricular size is normal. The left ventricular systolic function is normal. LV end diastolic pressure is mildly elevated. The left ventricular ejection fraction is 55-65% by visual estimate. No regional wall motion abnormalities.    Coronary Diagrams   Diagnostic Diagram          ASSESSMENT AND PLAN: 1.  CAD, native vessel, with angina: I reviewed the patient's most recent cardiac catheterization study and compared it to his previous images. He  has stable coronary anatomy with continued patency of his bypass grafts. He does have intermittent chest pain but really hasn't appreciated much change with an increase in isosorbide and in fact I suspect he is having side effects from the higher dose of isosorbide especially considering his relatively low blood pressure. Recommended to reduce suspect 30 mg daily. He will otherwise continue on his current medical program. I would like to see him back in one year. I think he would benefit from outpatient cardiac rehabilitation and a referral will be made today.  2. Hypertension: Blood pressure controlled on amlodipine, lisinopril, isosorbide, Toprol. Will reduce isosorbide as outlined above.  3. Hyperlipidemia: Treated with atorvastatin 40 mg daily. He also takes niacin 500 mg daily. Lipids are followed by his primary physician.  Current medicines are reviewed with the patient today.  The patient does not have concerns regarding medicines.  Labs/ tests ordered today include:   Orders Placed This Encounter  Procedures  . AMB referral to cardiac rehabilitation    Disposition:   FU one year  Signed, Sherren Mocha, MD  09/29/2016 5:01 PM    [facility] [address], [place], [place]  [address] Phone: [phone]; Fax: [phone]

## 2016-10-08 ENCOUNTER — Encounter (INDEPENDENT_AMBULATORY_CARE_PROVIDER_SITE_OTHER): Payer: Self-pay | Admitting: Internal Medicine

## 2016-11-19 ENCOUNTER — Other Ambulatory Visit: Payer: Self-pay | Admitting: Physician Assistant

## 2016-11-19 ENCOUNTER — Other Ambulatory Visit: Payer: Self-pay | Admitting: Cardiovascular Disease

## 2016-11-19 NOTE — Telephone Encounter (Signed)
Per last office visit with Dr Burt Knack 3. Hyperlipidemia: Treated with atorvastatin 40 mg daily. He also takes niacin 500 mg daily. Lipids are followed by his primary physician.  Okay to refill? Please advise. Thanks, MI

## 2016-11-29 ENCOUNTER — Ambulatory Visit (INDEPENDENT_AMBULATORY_CARE_PROVIDER_SITE_OTHER): Payer: Medicare Other | Admitting: Internal Medicine

## 2016-11-29 ENCOUNTER — Encounter (INDEPENDENT_AMBULATORY_CARE_PROVIDER_SITE_OTHER): Payer: Self-pay | Admitting: Internal Medicine

## 2016-11-29 VITALS — BP 150/62 | HR 60 | Temp 97.6°F | Ht 72.0 in | Wt 213.1 lb

## 2016-11-29 DIAGNOSIS — R1310 Dysphagia, unspecified: Secondary | ICD-10-CM | POA: Diagnosis not present

## 2016-11-29 DIAGNOSIS — R1319 Other dysphagia: Secondary | ICD-10-CM

## 2016-11-29 NOTE — Progress Notes (Signed)
Subjective:    Patient ID: [name], male    DOB: May 27, 1945, 71 y.o.   MRN: [id number]  HPI  Here today for f/u. Last seen in November of 2017. Underwent an EGD in November of 2017. EGD/ED revealed:  Impression:               - Normal proximal esophagus and mid esophagus.                           - Salmon-colored mucosa consistent with                            short-segment Barrett's esophagus. Biopsied.                           - Z-line irregular, 41 cm from the incisors.                           - No endoscopic esophageal abnormality to explain                            patient's dysphagia. Esophagus dilated prior to                            taking biopsies.                           - Gastric mucosal atrophy. Biopsy taken.                           - The examination was otherwise normal.                           - Normal duodenal bulb and second portion of the                            duodenum. Dilated with 43 [place].   Maintained on Plavix for CAD Occasionally has dysphagia.  He does have some chest pain occasionally.  Underwent a CT scan  Has seen cardiologist for this.  His appetite is good. His BMs are normal. He states he is retired.    Review of Systems Past Medical History:  Diagnosis Date  . Anginal pain ([place])   . Arthritis    "all over"  . Chronic lower back pain   . Coronary artery disease    a. s/p CABG x 2 (LIMA->LAD, RIMA->RCA);  b. s/p multiple PCI's to Ramus;  c. 08/2011 Cath/PCI: LM 70% into ramus with 70-80% there->treated wtih 3.5x18 Xience Xpedition DES, LCX  nonobs, RCA occluded.  RIMA & LIMA patent, EF 55-65%  . DJD (degenerative joint disease)   . GERD (gastroesophageal reflux disease)   . History of gout   . Hyperlipidemia   . Hypertension   . Hypothyroidism   . Neuropathy   . OSA on CPAP   . Prostate cancer [facility])     Past Surgical History:  Procedure Laterality Date  . ABDOMINAL HERNIA REPAIR    . BIOPSY  11/26/2015     Performed by Rogene Houston, MD at [place]  .  CATARACT EXTRACTION PHACO AND INTRAOCULAR LENS PLACEMENT LEFT EYE; CDE:  8.18 Left 08/25/2015   Performed by Tonny Branch, MD at [facility]  . CATARACT EXTRACTION PHACO AND INTRAOCULAR LENS PLACEMENT RIGHT EYE; CDE:  7.08 Right 06/23/2015   Performed by Tonny Branch, MD at [facility]  . COLONOSCOPY N/A 12/28/2012   Performed by Rogene Houston, MD at [place]  . CORONARY ANGIOPLASTY WITH STENT PLACEMENT  2013; 04/10/2015   "this makes me a total of 7" (04/10/2015)  . CORONARY ARTERY BYPASS GRAFT  1997   with (LIMA)  . Coronary Stent Intervention N/A 04/10/2015   Performed by Sherren Mocha, MD at [place] CV LAB  . ESOPHAGEAL DILATION N/A 11/26/2015   Performed by Rogene Houston, MD at [place]  . ESOPHAGOGASTRODUODENOSCOPY (EGD) N/A 11/26/2015   Performed by Rogene Houston, MD at [place]  . HERNIA REPAIR Left   . KNEE CARTILAGE SURGERY Bilateral   . LEFT HEART CATH AND CORS/GRAFTS ANGIOGRAPHY N/A 09/08/2016   Performed by Martinique, Peter M, MD at [place] [facility] LAB  . Left Heart Cath and Cors/Grafts Angiography N/A 04/10/2015   Performed by Sherren Mocha, MD at [place] [facility]  . LEFT HEART CATHETERIZATION WITH CORONARY ANGIOGRAM N/A 09/08/2011   Performed by Sherren Mocha, MD at [facility] CATH LAB  . PERCUTANEOUS CORONARY STENT INTERVENTION (PCI-S) Right 09/08/2011   Performed by Sherren Mocha, MD at [facility] CATH LAB  . ROBOT ASSISTED LAPAROSCOPIC RADICAL PROSTATECTOMY      Allergies  Allergen Reactions  . Codeine Nausea And Vomiting  . Pollen Extract Other (See Comments)    Seasonal    Current Outpatient Medications on File Prior to Visit  Medication Sig Dispense Refill  . amLODipine (NORVASC) 5 MG tablet TAKE 1 TABLET BY MOUTH EVERY DAY 90 tablet 1  . ascorbic acid (VITAMIN C) 500 MG tablet Take 500 mg daily by mouth.    Marland Kitchen aspirin 81 MG tablet Take 1 tablet (81 mg total) by mouth at bedtime. 30 tablet   .  atorvastatin (LIPITOR) 40 MG tablet Take 1 tablet (40 mg total) daily at 6 PM by mouth. 90 tablet 3  . celecoxib (CELEBREX) 200 MG capsule Take 1 capsule (200 mg total) by mouth daily.    . cholecalciferol (VITAMIN D) 1000 units tablet Take 1,000 Units by mouth 2 (two) times daily.     . clopidogrel (PLAVIX) 75 MG tablet Take 75 mg by mouth every morning.  30 tablet 11  . Coenzyme Q10 (COQ10) 200 MG CAPS Take 200 mg by mouth every evening.     . cyanocobalamin (,VITAMIN B-12,) 1000 MCG/ML injection Inject 1,000 mcg into the muscle every 30 (thirty) days.   2  . gabapentin (NEURONTIN) 600 MG tablet Take 600 mg by mouth 3 (three) times daily.  3  . glucosamine-chondroitin 500-400 MG tablet Take 1 tablet by mouth daily.    . isosorbide mononitrate (IMDUR) 30 MG 24 hr tablet Take 1 tablet (30 mg total) by mouth daily. 90 tablet 3  . levothyroxine (SYNTHROID, LEVOTHROID) 150 MCG tablet Take 150 mcg by mouth daily before breakfast.    . lisinopril (PRINIVIL,ZESTRIL) 5 MG tablet TAKE 1 TABLET BY MOUTH EVERY NIGHT AT BEDTIME 90 tablet 2  . metoprolol tartrate (LOPRESSOR) 25 MG tablet Take 25 mg by mouth daily.     . Multiple Vitamin (MULTIVITAMIN) tablet Take 1 tablet by mouth every other day.     Marland Kitchen  Omega-3 Fatty Acids (FISH OIL) 1200 MG CAPS Take 2 capsules by mouth 2 (two) times daily.     . pantoprazole (PROTONIX) 40 MG tablet Take 40 mg by mouth 2 (two) times daily.    . traMADol (ULTRAM) 50 MG tablet Take 50 mg by mouth every 8 (eight) hours.   1  . niacin 500 MG tablet Take 500 mg by mouth every evening.    . nitroGLYCERIN (NITROSTAT) 0.4 MG SL tablet PLACE 1 TABLET UNDER THE TONGUE EVERY 5 MINUTES AS NEEDED FOR CHEST PAIN (Patient not taking: Reported on 11/29/2016) 25 tablet 3   No current facility-administered medications on file prior to visit.         Objective:   Physical Exam Blood pressure (!) 150/62, pulse 60, temperature 97.6 F (36.4 C), height 6' (1.829 m), weight 213 lb 1.6 oz  (96.7 kg). Alert and oriented. Skin warm and dry. Oral mucosa is moist.   . Sclera anicteric, conjunctivae is pink. Thyroid not enlarged. No cervical lymphadenopathy. Lungs clear. Heart regular rate and rhythm.  Abdomen is soft. Bowel sounds are positive. No hepatomegaly. No abdominal masses felt. No tenderness.  No edema to lower extremities.           Assessment & Plan:  Dysphagia. Occasionally has dysphagia.  Chew foods well. OV in 1 year.  Chew food up well.  Continue the Protonix.

## 2016-11-29 NOTE — Patient Instructions (Signed)
Continue the Protonix 

## 2016-12-09 DIAGNOSIS — G629 Polyneuropathy, unspecified: Secondary | ICD-10-CM | POA: Diagnosis not present

## 2016-12-09 DIAGNOSIS — E039 Hypothyroidism, unspecified: Secondary | ICD-10-CM | POA: Diagnosis not present

## 2016-12-09 DIAGNOSIS — E782 Mixed hyperlipidemia: Secondary | ICD-10-CM | POA: Diagnosis not present

## 2016-12-09 DIAGNOSIS — Z0001 Encounter for general adult medical examination with abnormal findings: Secondary | ICD-10-CM | POA: Diagnosis not present

## 2016-12-09 DIAGNOSIS — I10 Essential (primary) hypertension: Secondary | ICD-10-CM | POA: Diagnosis not present

## 2016-12-09 DIAGNOSIS — C61 Malignant neoplasm of prostate: Secondary | ICD-10-CM | POA: Diagnosis not present

## 2016-12-09 DIAGNOSIS — D225 Melanocytic nevi of trunk: Secondary | ICD-10-CM | POA: Diagnosis not present

## 2016-12-09 DIAGNOSIS — Z1283 Encounter for screening for malignant neoplasm of skin: Secondary | ICD-10-CM | POA: Diagnosis not present

## 2016-12-09 DIAGNOSIS — I25810 Atherosclerosis of coronary artery bypass graft(s) without angina pectoris: Secondary | ICD-10-CM | POA: Diagnosis not present

## 2016-12-09 DIAGNOSIS — E785 Hyperlipidemia, unspecified: Secondary | ICD-10-CM | POA: Diagnosis not present

## 2016-12-09 DIAGNOSIS — R7309 Other abnormal glucose: Secondary | ICD-10-CM | POA: Diagnosis not present

## 2016-12-09 DIAGNOSIS — Z23 Encounter for immunization: Secondary | ICD-10-CM | POA: Diagnosis not present

## 2016-12-09 DIAGNOSIS — Z1389 Encounter for screening for other disorder: Secondary | ICD-10-CM | POA: Diagnosis not present

## 2016-12-20 ENCOUNTER — Other Ambulatory Visit: Payer: Self-pay | Admitting: Cardiovascular Disease

## 2016-12-21 ENCOUNTER — Other Ambulatory Visit: Payer: Self-pay | Admitting: Cardiovascular Disease

## 2017-01-10 DIAGNOSIS — Z8551 Personal history of malignant neoplasm of bladder: Secondary | ICD-10-CM | POA: Diagnosis not present

## 2017-01-10 DIAGNOSIS — C61 Malignant neoplasm of prostate: Secondary | ICD-10-CM | POA: Diagnosis not present

## 2017-01-10 DIAGNOSIS — N35010 Post-traumatic urethral stricture, male, meatal: Secondary | ICD-10-CM | POA: Diagnosis not present

## 2017-02-23 DIAGNOSIS — R310 Gross hematuria: Secondary | ICD-10-CM | POA: Diagnosis not present

## 2017-02-23 DIAGNOSIS — R399 Unspecified symptoms and signs involving the genitourinary system: Secondary | ICD-10-CM | POA: Diagnosis not present

## 2017-04-18 ENCOUNTER — Ambulatory Visit (INDEPENDENT_AMBULATORY_CARE_PROVIDER_SITE_OTHER): Payer: Medicare Other | Admitting: Internal Medicine

## 2017-04-18 ENCOUNTER — Encounter (INDEPENDENT_AMBULATORY_CARE_PROVIDER_SITE_OTHER): Payer: Self-pay | Admitting: Internal Medicine

## 2017-04-18 ENCOUNTER — Other Ambulatory Visit (INDEPENDENT_AMBULATORY_CARE_PROVIDER_SITE_OTHER): Payer: Self-pay | Admitting: *Deleted

## 2017-04-18 VITALS — BP 138/80 | HR 72 | Temp 98.0°F | Ht 72.0 in | Wt 225.3 lb

## 2017-04-18 DIAGNOSIS — R079 Chest pain, unspecified: Secondary | ICD-10-CM

## 2017-04-18 DIAGNOSIS — R1319 Other dysphagia: Secondary | ICD-10-CM

## 2017-04-18 DIAGNOSIS — R1310 Dysphagia, unspecified: Secondary | ICD-10-CM | POA: Diagnosis not present

## 2017-04-18 NOTE — Patient Instructions (Addendum)
H. Pylori. Further recommendations to follow.  DG esphagram.

## 2017-04-18 NOTE — Progress Notes (Addendum)
Subjective:    Patient ID: [name], male    DOB: 01-Dec-1945, 72 y.o.   MRN: [id number]  HPI Presents today with c/o dysphagia. When he starts exercising, he has burning across his chest. He says had catherization last year and was normal. This pain occurs during exercise. He is doing Probiotics which has not changed his symptoms. He says he is not having any GERD. He does burp some.  He has some bloating, which is better since the Probiotic. This has been occurring for about 3 months.  Hx of H. Pylori with treatment in 2017.  Appetite is good. No weight loss. Also c/o some dysphagia.  Has a BM daily. No melena or BRRB.  Underwent an EGD/ED in 2017 which revealed: Impression: - Normal proximal esophagus and mid esophagus. - Salmon-colored mucosa consistent with  short-segment Barrett's esophagus. Biopsied. - Z-line irregular, 41 cm from the incisors. - No endoscopic esophageal abnormality to explain  patient's dysphagia. Esophagus dilated prior to  taking biopsies. - Gastric mucosal atrophy. Biopsy taken. - The examination was otherwise normal. - Normal duodenal bulb and second portion of the  duodenum. Dilated with 1 [place].   Hx of CABG 22 yrs ago and maintained on Plavix and ASA   . Review of Systems Past Medical History:  Diagnosis Date  . Anginal pain ([place])   . Arthritis    "all over"  . Chronic lower back pain   . Coronary artery disease    a. s/p CABG x 2 (LIMA->LAD, RIMA->RCA);  b. s/p multiple PCI's to Ramus;  c. 08/2011 Cath/PCI: LM 70% into ramus with 70-80% there->treated wtih 3.5x18 Xience Xpedition DES, LCX  nonobs, RCA occluded.  RIMA & LIMA patent, EF 55-65%  . DJD (degenerative joint  disease)   . GERD (gastroesophageal reflux disease)   . History of gout   . Hyperlipidemia   . Hypertension   . Hypothyroidism   . Neuropathy   . OSA on CPAP   . Prostate cancer [facility])     Past Surgical History:  Procedure Laterality Date  . ABDOMINAL HERNIA REPAIR    . BIOPSY  11/26/2015   Procedure: BIOPSY;  Surgeon: Rogene Houston, MD;  [facility];  Service: Endoscopy;;  gastric esophagus  . CARDIAC CATHETERIZATION N/A 04/10/2015   Procedure: Left Heart Cath and Cors/Grafts Angiography;  Surgeon: Sherren Mocha, MD;  Location: [place] CV LAB;  Service: Cardiovascular;  Laterality: N/A;  . CARDIAC CATHETERIZATION N/A 04/10/2015   Procedure: Coronary Stent Intervention;  Surgeon: Sherren Mocha, MD;  Location: [place] CV LAB;  Service: Cardiovascular;  Laterality: N/A;  . CATARACT EXTRACTION W/PHACO Right 06/23/2015   Procedure: CATARACT EXTRACTION PHACO AND INTRAOCULAR LENS PLACEMENT RIGHT EYE; CDE:  7.08;  Surgeon: Tonny Branch, MD;  Location: [facility] OR[facility];  Service: Ophthalmology;  Laterality: Right;  . CATARACT EXTRACTION W/PHACO Left 08/25/2015   Procedure: CATARACT EXTRACTION PHACO AND INTRAOCULAR LENS PLACEMENT LEFT EYE; CDE:  8.18;  Surgeon: Tonny Branch, MD;  Location: [facility] OR[facility];  Service: Ophthalmology;  Laterality: Left;  . COLONOSCOPY N/A 12/28/2012   Procedure: COLONOSCOPY;  Surgeon: Rogene Houston, MD;  [facility];  Service: Endoscopy;  Laterality: N/A;  830 rescheduled  . CORONARY ANGIOPLASTY WITH STENT PLACEMENT  2013; 04/10/2015   "this makes me a total of 7" (04/10/2015)  . CORONARY ARTERY BYPASS GRAFT  1997   with (LIMA)  . ESOPHAGEAL DILATION N/A 11/26/2015   Procedure: ESOPHAGEAL DILATION;  Surgeon: Bernadene Person  Gloriann Loan, MD;  [facility];  Service: Endoscopy;  Laterality: N/A;  . ESOPHAGOGASTRODUODENOSCOPY N/A 11/26/2015   Procedure: ESOPHAGOGASTRODUODENOSCOPY (EGD);  Surgeon: Rogene Houston, MD;  [facility];  Service:  Endoscopy;  Laterality: N/A;  1:25  . HERNIA REPAIR Left   . KNEE CARTILAGE SURGERY Bilateral   . LEFT HEART CATH AND CORS/GRAFTS ANGIOGRAPHY N/A 09/08/2016   Procedure: LEFT HEART CATH AND CORS/GRAFTS ANGIOGRAPHY;  Surgeon: Martinique, Peter M, MD;  Location: [place] CV LAB;  Service: Cardiovascular;  Laterality: N/A;  . LEFT HEART CATHETERIZATION WITH CORONARY ANGIOGRAM N/A 09/08/2011   Procedure: LEFT HEART CATHETERIZATION WITH CORONARY ANGIOGRAM;  Surgeon: Sherren Mocha, MD;  Location: [facility] CATH LAB;  Service: Cardiovascular;  Laterality: N/A;  . PERCUTANEOUS CORONARY STENT INTERVENTION (PCI-S) Right 09/08/2011   Procedure: PERCUTANEOUS CORONARY STENT INTERVENTION (PCI-S);  Surgeon: Sherren Mocha, MD;  Location: [facility] CATH LAB;  Service: Cardiovascular;  Laterality: Right;  . ROBOT ASSISTED LAPAROSCOPIC RADICAL PROSTATECTOMY      Allergies  Allergen Reactions  . Codeine Nausea And Vomiting  . Pollen Extract Other (See Comments)    Seasonal    Current Outpatient Medications on File Prior to Visit  Medication Sig Dispense Refill  . amLODipine (NORVASC) 5 MG tablet TAKE 1 TABLET BY MOUTH EVERY DAY 90 tablet 1  . ascorbic acid (VITAMIN C) 500 MG tablet Take 500 mg daily by mouth.    . celecoxib (CELEBREX) 200 MG capsule Take 1 capsule (200 mg total) by mouth daily.    . cholecalciferol (VITAMIN D) 1000 units tablet Take 1,000 Units by mouth 2 (two) times daily.     . clopidogrel (PLAVIX) 75 MG tablet Take 75 mg by mouth every morning.  30 tablet 11  . Coenzyme Q10 (COQ10) 200 MG CAPS Take 200 mg by mouth every evening.     . cyanocobalamin (,VITAMIN B-12,) 1000 MCG/ML injection Inject 1,000 mcg into the muscle every 30 (thirty) days.   2  . gabapentin (NEURONTIN) 600 MG tablet Take 600 mg by mouth 3 (three) times daily.  3  . glucosamine-chondroitin 500-400 MG tablet Take 1 tablet by mouth daily.    . isosorbide mononitrate (IMDUR) 30 MG 24 hr tablet TAKE 1 TABLET BY MOUTH DAILY 90 tablet 1    . levothyroxine (SYNTHROID, LEVOTHROID) 150 MCG tablet Take 150 mcg by mouth daily before breakfast.    . lisinopril (PRINIVIL,ZESTRIL) 5 MG tablet TAKE 1 TABLET BY MOUTH EVERY NIGHT AT BEDTIME 90 tablet 2  . metoprolol tartrate (LOPRESSOR) 25 MG tablet Take 25 mg by mouth daily.     . Multiple Vitamin (MULTIVITAMIN) tablet Take 1 tablet by mouth every other day.     . nitroGLYCERIN (NITROSTAT) 0.4 MG SL tablet PLACE 1 TABLET UNDER THE TONGUE EVERY 5 MINUTES AS NEEDED FOR CHEST PAIN 25 tablet 3  . Omega-3 Fatty Acids (FISH OIL) 1200 MG CAPS Take 2 capsules by mouth 2 (two) times daily.     . pantoprazole (PROTONIX) 40 MG tablet Take 40 mg by mouth 2 (two) times daily.    . traMADol (ULTRAM) 50 MG tablet Take 50 mg by mouth every 8 (eight) hours.   1  . aspirin 81 MG tablet Take 1 tablet (81 mg total) by mouth at bedtime. 30 tablet   . atorvastatin (LIPITOR) 40 MG tablet Take 1 tablet (40 mg total) daily at 6 PM by mouth. 90 tablet 3   No current facility-administered medications on file prior to visit.  Objective:   Physical Exam Blood pressure 138/80, pulse 72, temperature 98 F (36.7 C), height 6' (1.829 m), weight 225 lb 4.8 oz (102.2 kg). Alert and oriented. Skin warm and dry. Oral mucosa is moist.   . Sclera anicteric, conjunctivae is pink. Thyroid not enlarged. No cervical lymphadenopathy. Lungs clear. Heart regular rate and rhythm.  Abdomen is soft. Bowel sounds are positive. No hepatomegaly. No abdominal masses felt. No tenderness.  No edema to lower extremities.       Assessment & Plan:  Chest pain. Am going to get an H. Pylori. Further recommendations to follow. Dysphagia.Will get an Esphagram.

## 2017-04-21 ENCOUNTER — Ambulatory Visit (HOSPITAL_COMMUNITY)
Admission: RE | Admit: 2017-04-21 | Discharge: 2017-04-21 | Disposition: A | Discharge status: Home / Self Care | Payer: Medicare Other | Source: Ambulatory Visit | Attending: Internal Medicine | Admitting: Internal Medicine

## 2017-04-21 DIAGNOSIS — R1319 Other dysphagia: Secondary | ICD-10-CM

## 2017-04-21 DIAGNOSIS — R1310 Dysphagia, unspecified: Secondary | ICD-10-CM | POA: Diagnosis not present

## 2017-04-21 DIAGNOSIS — K449 Diaphragmatic hernia without obstruction or gangrene: Secondary | ICD-10-CM | POA: Diagnosis not present

## 2017-04-26 ENCOUNTER — Ambulatory Visit (HOSPITAL_COMMUNITY): Payer: Medicare Other

## 2017-05-03 ENCOUNTER — Telehealth (INDEPENDENT_AMBULATORY_CARE_PROVIDER_SITE_OTHER): Payer: Self-pay | Admitting: Internal Medicine

## 2017-05-03 ENCOUNTER — Other Ambulatory Visit (INDEPENDENT_AMBULATORY_CARE_PROVIDER_SITE_OTHER): Payer: Self-pay | Admitting: Internal Medicine

## 2017-05-03 DIAGNOSIS — R1319 Other dysphagia: Secondary | ICD-10-CM

## 2017-05-03 DIAGNOSIS — R1310 Dysphagia, unspecified: Secondary | ICD-10-CM

## 2017-05-03 NOTE — Telephone Encounter (Signed)
Ann, EGD/ED/ 

## 2017-05-03 NOTE — Telephone Encounter (Signed)
Results of DG esophagram given to patient. Will proceed with EGD/ED

## 2017-05-03 NOTE — Telephone Encounter (Signed)
Patient stated he is returning your call in reference to test results - please call patient back at [phone]

## 2017-05-04 ENCOUNTER — Encounter (INDEPENDENT_AMBULATORY_CARE_PROVIDER_SITE_OTHER): Payer: Self-pay | Admitting: *Deleted

## 2017-05-04 DIAGNOSIS — R1319 Other dysphagia: Secondary | ICD-10-CM | POA: Insufficient documentation

## 2017-05-04 DIAGNOSIS — R1310 Dysphagia, unspecified: Secondary | ICD-10-CM | POA: Insufficient documentation

## 2017-05-04 NOTE — Telephone Encounter (Signed)
EGD/ED sch'd 06/03/17 at 1:15 (1215), patient aware, instructions mailed

## 2017-05-23 ENCOUNTER — Ambulatory Visit (HOSPITAL_COMMUNITY)
Admission: RE | Admit: 2017-05-23 | Discharge: 2017-05-23 | Disposition: A | Discharge status: Home / Self Care | Payer: Medicare Other | Source: Ambulatory Visit | Attending: Internal Medicine | Admitting: Internal Medicine

## 2017-05-23 ENCOUNTER — Other Ambulatory Visit: Payer: Self-pay

## 2017-05-23 ENCOUNTER — Encounter (HOSPITAL_COMMUNITY)
Admission: RE | Disposition: A | Discharge status: Home / Self Care | Payer: Self-pay | Source: Ambulatory Visit | Attending: Internal Medicine

## 2017-05-23 ENCOUNTER — Encounter (HOSPITAL_COMMUNITY): Payer: Self-pay | Admitting: *Deleted

## 2017-05-23 DIAGNOSIS — R1319 Other dysphagia: Secondary | ICD-10-CM | POA: Insufficient documentation

## 2017-05-23 DIAGNOSIS — R1310 Dysphagia, unspecified: Secondary | ICD-10-CM

## 2017-05-23 HISTORY — PX: ESOPHAGOGASTRODUODENOSCOPY: SHX5428

## 2017-05-23 HISTORY — PX: ESOPHAGEAL DILATION: SHX303

## 2017-05-23 SURGERY — EGD (ESOPHAGOGASTRODUODENOSCOPY)
Anesthesia: Moderate Sedation

## 2017-05-23 MED ORDER — LIDOCAINE VISCOUS HCL 2 % MT SOLN
OROMUCOSAL | Status: AC
  Filled 2017-05-23: qty 15

## 2017-05-23 MED ORDER — MEPERIDINE HCL 50 MG/ML IJ SOLN
INTRAMUSCULAR | Status: AC
  Filled 2017-05-23: qty 1

## 2017-05-23 MED ORDER — SODIUM CHLORIDE 0.9 % IV SOLN: INTRAVENOUS | Status: DC

## 2017-05-23 MED ORDER — MIDAZOLAM HCL 5 MG/5ML IJ SOLN
INTRAMUSCULAR | Status: AC
  Filled 2017-05-23: qty 10

## 2017-05-23 NOTE — H&P (Signed)
GI attending note:  Patient is here for EGD and ED. Patient stop his aspirin but he is on clopidogrel. Procedure is not an emergency.  Therefore will be rescheduled. We will check with his cardiologist prior to stopping clopidogrel.

## 2017-05-24 ENCOUNTER — Encounter (INDEPENDENT_AMBULATORY_CARE_PROVIDER_SITE_OTHER): Payer: Self-pay | Admitting: *Deleted

## 2017-05-24 ENCOUNTER — Other Ambulatory Visit (INDEPENDENT_AMBULATORY_CARE_PROVIDER_SITE_OTHER): Payer: Self-pay | Admitting: *Deleted

## 2017-05-24 ENCOUNTER — Telehealth: Payer: Self-pay | Admitting: Otolaryngology

## 2017-05-24 DIAGNOSIS — R1310 Dysphagia, unspecified: Secondary | ICD-10-CM

## 2017-05-24 NOTE — Telephone Encounter (Signed)
Ok to proceed as planned at low cardiac risk. Ok to hold plavix 5 days prior to the procedure. thx

## 2017-05-24 NOTE — Telephone Encounter (Signed)
New Message      [facility] Pre-operative Risk Assessment    Request for surgical clearance:  1. What type of surgery is being performed? Endoscopy with dilation   2. When is this surgery scheduled? 06/16/2017    3. What type of clearance is required (medical clearance vs. Pharmacy clearance to hold med vs. Both)? Pharmacy   4. Are there any medications that need to be held prior to surgery and how long? Plavix five days and aspirin two days prior to procedure   5. Practice name and name of physician performing surgery? [facility] Dr. Laural Golden  6. What is your office phone number [phone]   7.   What is your office fax number [phone]  8.   Anesthesia type (None, local, MAC, general) ? General    Avaletta L Williams 05/24/2017, 11:36 AM  _________________________________________________________________   (provider comments below)

## 2017-05-24 NOTE — Telephone Encounter (Signed)
Faxed via Epic to Dr. Laural Golden  Fax: [phone]

## 2017-05-25 ENCOUNTER — Encounter (HOSPITAL_COMMUNITY): Payer: Self-pay | Admitting: Internal Medicine

## 2017-06-13 ENCOUNTER — Other Ambulatory Visit: Payer: Self-pay | Admitting: Cardiovascular Disease

## 2017-06-15 DIAGNOSIS — I10 Essential (primary) hypertension: Secondary | ICD-10-CM | POA: Diagnosis not present

## 2017-06-15 DIAGNOSIS — E039 Hypothyroidism, unspecified: Secondary | ICD-10-CM | POA: Diagnosis not present

## 2017-06-15 DIAGNOSIS — R7309 Other abnormal glucose: Secondary | ICD-10-CM | POA: Diagnosis not present

## 2017-06-15 DIAGNOSIS — Z1389 Encounter for screening for other disorder: Secondary | ICD-10-CM | POA: Diagnosis not present

## 2017-06-15 DIAGNOSIS — G4733 Obstructive sleep apnea (adult) (pediatric): Secondary | ICD-10-CM | POA: Diagnosis not present

## 2017-06-15 DIAGNOSIS — I25810 Atherosclerosis of coronary artery bypass graft(s) without angina pectoris: Secondary | ICD-10-CM | POA: Diagnosis not present

## 2017-06-15 DIAGNOSIS — E782 Mixed hyperlipidemia: Secondary | ICD-10-CM | POA: Diagnosis not present

## 2017-06-16 ENCOUNTER — Encounter (HOSPITAL_COMMUNITY)
Admission: RE | Disposition: A | Discharge status: Home / Self Care | Payer: Self-pay | Source: Ambulatory Visit | Attending: Internal Medicine

## 2017-06-16 ENCOUNTER — Other Ambulatory Visit: Payer: Self-pay

## 2017-06-16 ENCOUNTER — Encounter (HOSPITAL_COMMUNITY): Payer: Self-pay

## 2017-06-16 ENCOUNTER — Ambulatory Visit (HOSPITAL_COMMUNITY)
Admission: RE | Admit: 2017-06-16 | Discharge: 2017-06-16 | Disposition: A | Discharge status: Home / Self Care | Payer: Medicare Other | Source: Ambulatory Visit | Attending: Internal Medicine | Admitting: Internal Medicine

## 2017-06-16 DIAGNOSIS — Z87891 Personal history of nicotine dependence: Secondary | ICD-10-CM | POA: Insufficient documentation

## 2017-06-16 DIAGNOSIS — Z951 Presence of aortocoronary bypass graft: Secondary | ICD-10-CM | POA: Insufficient documentation

## 2017-06-16 DIAGNOSIS — M109 Gout, unspecified: Secondary | ICD-10-CM | POA: Diagnosis not present

## 2017-06-16 DIAGNOSIS — Z885 Allergy status to narcotic agent status: Secondary | ICD-10-CM | POA: Diagnosis not present

## 2017-06-16 DIAGNOSIS — Z9842 Cataract extraction status, left eye: Secondary | ICD-10-CM | POA: Insufficient documentation

## 2017-06-16 DIAGNOSIS — K219 Gastro-esophageal reflux disease without esophagitis: Secondary | ICD-10-CM | POA: Diagnosis not present

## 2017-06-16 DIAGNOSIS — Z79899 Other long term (current) drug therapy: Secondary | ICD-10-CM | POA: Insufficient documentation

## 2017-06-16 DIAGNOSIS — K3189 Other diseases of stomach and duodenum: Secondary | ICD-10-CM | POA: Diagnosis not present

## 2017-06-16 DIAGNOSIS — M1990 Unspecified osteoarthritis, unspecified site: Secondary | ICD-10-CM | POA: Insufficient documentation

## 2017-06-16 DIAGNOSIS — G8929 Other chronic pain: Secondary | ICD-10-CM | POA: Diagnosis not present

## 2017-06-16 DIAGNOSIS — R1314 Dysphagia, pharyngoesophageal phase: Secondary | ICD-10-CM | POA: Insufficient documentation

## 2017-06-16 DIAGNOSIS — Z7982 Long term (current) use of aspirin: Secondary | ICD-10-CM | POA: Diagnosis not present

## 2017-06-16 DIAGNOSIS — M545 Low back pain: Secondary | ICD-10-CM | POA: Diagnosis not present

## 2017-06-16 DIAGNOSIS — E785 Hyperlipidemia, unspecified: Secondary | ICD-10-CM | POA: Insufficient documentation

## 2017-06-16 DIAGNOSIS — E039 Hypothyroidism, unspecified: Secondary | ICD-10-CM | POA: Diagnosis not present

## 2017-06-16 DIAGNOSIS — G4733 Obstructive sleep apnea (adult) (pediatric): Secondary | ICD-10-CM | POA: Diagnosis not present

## 2017-06-16 DIAGNOSIS — G629 Polyneuropathy, unspecified: Secondary | ICD-10-CM | POA: Insufficient documentation

## 2017-06-16 DIAGNOSIS — I2510 Atherosclerotic heart disease of native coronary artery without angina pectoris: Secondary | ICD-10-CM | POA: Diagnosis not present

## 2017-06-16 DIAGNOSIS — Z9841 Cataract extraction status, right eye: Secondary | ICD-10-CM | POA: Diagnosis not present

## 2017-06-16 DIAGNOSIS — Z9109 Other allergy status, other than to drugs and biological substances: Secondary | ICD-10-CM | POA: Diagnosis not present

## 2017-06-16 DIAGNOSIS — Z955 Presence of coronary angioplasty implant and graft: Secondary | ICD-10-CM | POA: Insufficient documentation

## 2017-06-16 DIAGNOSIS — I10 Essential (primary) hypertension: Secondary | ICD-10-CM | POA: Diagnosis not present

## 2017-06-16 DIAGNOSIS — Z8546 Personal history of malignant neoplasm of prostate: Secondary | ICD-10-CM | POA: Diagnosis not present

## 2017-06-16 DIAGNOSIS — R1310 Dysphagia, unspecified: Secondary | ICD-10-CM

## 2017-06-16 DIAGNOSIS — K228 Other specified diseases of esophagus: Secondary | ICD-10-CM | POA: Diagnosis not present

## 2017-06-16 DIAGNOSIS — K2270 Barrett's esophagus without dysplasia: Secondary | ICD-10-CM | POA: Diagnosis not present

## 2017-06-16 HISTORY — PX: ESOPHAGEAL DILATION: SHX303

## 2017-06-16 HISTORY — PX: ESOPHAGOGASTRODUODENOSCOPY: SHX5428

## 2017-06-16 SURGERY — EGD (ESOPHAGOGASTRODUODENOSCOPY)
Anesthesia: Moderate Sedation

## 2017-06-16 MED ORDER — LIDOCAINE VISCOUS HCL 2 % MT SOLN
OROMUCOSAL | Status: AC
  Filled 2017-06-16: qty 15

## 2017-06-16 MED ORDER — MEPERIDINE HCL 50 MG/ML IJ SOLN
INTRAMUSCULAR | Status: AC
  Filled 2017-06-16: qty 1

## 2017-06-16 MED ORDER — MIDAZOLAM HCL 5 MG/5ML IJ SOLN
INTRAMUSCULAR | Status: DC | PRN
  Administered 2017-06-16: 1 mg via INTRAVENOUS
  Administered 2017-06-16: 2 mg via INTRAVENOUS

## 2017-06-16 MED ORDER — MEPERIDINE HCL 50 MG/ML IJ SOLN
INTRAMUSCULAR | Status: DC | PRN
  Administered 2017-06-16: 25 mg via INTRAVENOUS

## 2017-06-16 MED ORDER — LIDOCAINE VISCOUS HCL 2 % MT SOLN
OROMUCOSAL | Status: DC | PRN
  Administered 2017-06-16: 1 via OROMUCOSAL

## 2017-06-16 MED ORDER — SODIUM CHLORIDE 0.9 % IV SOLN
INTRAVENOUS | Status: DC
  Administered 2017-06-16: 12:00:00 via INTRAVENOUS

## 2017-06-16 MED ORDER — MIDAZOLAM HCL 5 MG/5ML IJ SOLN
INTRAMUSCULAR | Status: AC
  Filled 2017-06-16: qty 10

## 2017-06-16 NOTE — Discharge Instructions (Signed)
Resume aspirin celecoxib and clopidogrel tomorrow afternoon. Resume other medications as before. Resume usual diet. No driving for 24 hours. Physician will call with biopsy results.   Upper Endoscopy, Care After Refer to this sheet in the next few weeks. These instructions provide you with information about caring for yourself after your procedure. Your health care provider may also give you more specific instructions. Your treatment has been planned according to current medical practices, but problems sometimes occur. Call your health care provider if you have any problems or questions after your procedure. What can I expect after the procedure? After the procedure, it is common to have:  A sore throat.  Bloating.  Nausea.  Follow these instructions at home:  Follow instructions from your health care provider about what to eat or drink after your procedure.  Return to your normal activities as told by your health care provider. Ask your health care provider what activities are safe for you.  Take over-the-counter and prescription medicines only as told by your health care provider.  Do not drive for 24 hours if you received a sedative.  Keep all follow-up visits as told by your health care provider. This is important. Contact a health care provider if:  You have a sore throat that lasts longer than one day.  You have trouble swallowing. Get help right away if:  You have a fever.  You vomit blood or your vomit looks like coffee grounds.  You have bloody, black, or tarry stools.  You have a severe sore throat or you cannot swallow.  You have difficulty breathing.  You have severe pain in your chest or belly. This information is not intended to replace advice given to you by your health care provider. Make sure you discuss any questions you have with your health care provider. Document Released: 06/29/2011 Document Revised: 06/05/2015 Document Reviewed:  10/10/2014 Elsevier Interactive Patient Education  [employer].

## 2017-06-16 NOTE — H&P (Signed)
[name] is an 72 y.o. male.   Chief Complaint: Patient is here for EGD and ED. HPI: Patient is 72 year old Caucasian male who has chronic GERD complicated by short segment Barrett's esophagus presents with 2-month history of intermittent solid food dysphagia.  He points to the suprasternal area sort of bolus obstruction.  His last EGD with dilation was in November 2017.  No structural abnormalities identified but he responded to dilation.  He also underwent barium study on 04/21/2017 and no abnormality was identified. Denies nausea vomiting abdominal pain or melena. Patient was here for EGD few weeks ago but he did not stop his Plavix.  Therefore procedure was rescheduled.  Past Medical History:  Diagnosis Date  . Anginal pain ([place])   . Arthritis    "all over"  . Chronic lower back pain   . Coronary artery disease    a. s/p CABG x 2 (LIMA->LAD, RIMA->RCA);  b. s/p multiple PCI's to Ramus;  c. 08/2011 Cath/PCI: LM 70% into ramus with 70-80% there->treated wtih 3.5x18 Xience Xpedition DES, LCX  nonobs, RCA occluded.  RIMA & LIMA patent, EF 55-65%  . DJD (degenerative joint disease)   . GERD (gastroesophageal reflux disease)   . History of gout   . Hyperlipidemia   . Hypertension   . Hypothyroidism   . Neuropathy   . OSA on CPAP   . Prostate cancer [facility])     Past Surgical History:  Procedure Laterality Date  . ABDOMINAL HERNIA REPAIR    . BIOPSY  11/26/2015   Procedure: BIOPSY;  Surgeon: Rogene Houston, MD;  [facility];  Service: Endoscopy;;  gastric esophagus  . CARDIAC CATHETERIZATION N/A 04/10/2015   Procedure: Left Heart Cath and Cors/Grafts Angiography;  Surgeon: Sherren Mocha, MD;  Location: [place] CV LAB;  Service: Cardiovascular;  Laterality: N/A;  . CARDIAC CATHETERIZATION N/A 04/10/2015   Procedure: Coronary Stent Intervention;  Surgeon: Sherren Mocha, MD;  Location: [place] CV LAB;  Service: Cardiovascular;  Laterality: N/A;  . CATARACT  EXTRACTION W/PHACO Right 06/23/2015   Procedure: CATARACT EXTRACTION PHACO AND INTRAOCULAR LENS PLACEMENT RIGHT EYE; CDE:  7.08;  Surgeon: Tonny Branch, MD;  Location: [facility];  Service: Ophthalmology;  Laterality: Right;  . CATARACT EXTRACTION W/PHACO Left 08/25/2015   Procedure: CATARACT EXTRACTION PHACO AND INTRAOCULAR LENS PLACEMENT LEFT EYE; CDE:  8.18;  Surgeon: Tonny Branch, MD;  Location: [facility];  Service: Ophthalmology;  Laterality: Left;  . COLONOSCOPY N/A 12/28/2012   Procedure: COLONOSCOPY;  Surgeon: Rogene Houston, MD;  [facility];  Service: Endoscopy;  Laterality: N/A;  830 rescheduled  . CORONARY ANGIOPLASTY WITH STENT PLACEMENT  2013; 04/10/2015   "this makes me a total of 7" (04/10/2015)  . CORONARY ARTERY BYPASS GRAFT  1997   with (LIMA)  . ESOPHAGEAL DILATION N/A 11/26/2015   Procedure: ESOPHAGEAL DILATION;  Surgeon: Rogene Houston, MD;  [facility];  Service: Endoscopy;  Laterality: N/A;  . ESOPHAGEAL DILATION N/A 05/23/2017   Procedure: ESOPHAGEAL DILATION;  Surgeon: Rogene Houston, MD;  [facility];  Service: Endoscopy;  Laterality: N/A;  . ESOPHAGOGASTRODUODENOSCOPY N/A 11/26/2015   Procedure: ESOPHAGOGASTRODUODENOSCOPY (EGD);  Surgeon: Rogene Houston, MD;  [facility];  Service: Endoscopy;  Laterality: N/A;  1:25  . ESOPHAGOGASTRODUODENOSCOPY N/A 05/23/2017   Procedure: ESOPHAGOGASTRODUODENOSCOPY (EGD);  Surgeon: Rogene Houston, MD;  [facility];  Service: Endoscopy;  Laterality: N/A;  1:15  . HERNIA REPAIR Left   .  KNEE CARTILAGE SURGERY Bilateral   . LEFT HEART CATH AND CORS/GRAFTS ANGIOGRAPHY N/A 09/08/2016   Procedure: LEFT HEART CATH AND CORS/GRAFTS ANGIOGRAPHY;  Surgeon: Martinique, Peter M, MD;  Location: [place] CV LAB;  Service: Cardiovascular;  Laterality: N/A;  . LEFT HEART CATHETERIZATION WITH CORONARY ANGIOGRAM N/A 09/08/2011   Procedure: LEFT HEART CATHETERIZATION WITH CORONARY ANGIOGRAM;  Surgeon:  Sherren Mocha, MD;  Location: [facility] CATH LAB;  Service: Cardiovascular;  Laterality: N/A;  . PERCUTANEOUS CORONARY STENT INTERVENTION (PCI-S) Right 09/08/2011   Procedure: PERCUTANEOUS CORONARY STENT INTERVENTION (PCI-S);  Surgeon: Sherren Mocha, MD;  Location: [facility] CATH LAB;  Service: Cardiovascular;  Laterality: Right;  . ROBOT ASSISTED LAPAROSCOPIC RADICAL PROSTATECTOMY      Family History  Problem Relation Age of Onset  . Cancer Father 90       died  . Stroke Mother 33       died   Social History:  reports that he has quit smoking. His smoking use included cigars. He quit after 12.00 years of use. He has never used smokeless tobacco. He reports that he drinks alcohol. He reports that he does not use drugs.  Allergies:  Allergies  Allergen Reactions  . Codeine Nausea Only  . Pollen Extract Other (See Comments)    Seasonal    Medications Prior to Admission  Medication Sig Dispense Refill  . amLODipine (NORVASC) 5 MG tablet TAKE 1 TABLET BY MOUTH EVERY DAY 90 tablet 1  . aspirin 81 MG tablet Take 1 tablet (81 mg total) by mouth at bedtime. 30 tablet   . atorvastatin (LIPITOR) 40 MG tablet Take 1 tablet (40 mg total) daily at 6 PM by mouth. 90 tablet 3  . Biotin 5000 MCG CAPS Take 5,000 mcg by mouth daily.     . celecoxib (CELEBREX) 200 MG capsule Take 1 capsule (200 mg total) by mouth daily.    . clopidogrel (PLAVIX) 75 MG tablet Take 75 mg by mouth every morning.  30 tablet 11  . Coenzyme Q10 (COQ10) 200 MG CAPS Take 200 mg by mouth every evening.     . cyanocobalamin (,VITAMIN B-12,) 1000 MCG/ML injection Inject 1,000 mcg into the muscle See admin instructions. Monthly as needed  2  . gabapentin (NEURONTIN) 600 MG tablet Take 600 mg by mouth 3 (three) times daily.  3  . glucosamine-chondroitin 500-400 MG tablet Take 1 tablet by mouth daily.    . isosorbide mononitrate (IMDUR) 30 MG 24 hr tablet Take 1 tablet (30 mg total) by mouth daily. Please make yearly appt with Dr. Burt Knack for  September for future refills. 1st attempt 90 tablet 0  . Lecith-Inosi-Chol-B12-Liver (LIVERITE PO) Take 1 tablet by mouth daily.    Marland Kitchen levothyroxine (SYNTHROID, LEVOTHROID) 150 MCG tablet Take 150 mcg by mouth daily before breakfast.    . metoprolol tartrate (LOPRESSOR) 25 MG tablet Take 25 mg by mouth daily.     . Multiple Vitamin (MULTIVITAMIN) tablet Take 1 tablet by mouth every other day.     . Omega-3 Fatty Acids (FISH OIL) 1200 MG CAPS Take 1,200 capsules by mouth 2 (two) times daily.     Marland Kitchen OVER THE COUNTER MEDICATION Take 2 capsules by mouth 2 (two) times daily. Lactin Probiotic    . pantoprazole (PROTONIX) 40 MG tablet Take 40 mg by mouth 2 (two) times daily.    . Polyvinyl Alcohol-Povidone (REFRESH OP) Place 1 drop into both eyes daily as needed (dry eyes).    . traMADol (ULTRAM) 50 MG  tablet Take 50 mg by mouth 3 (three) times daily.   1  . lisinopril (PRINIVIL,ZESTRIL) 5 MG tablet TAKE 1 TABLET BY MOUTH EVERY NIGHT AT BEDTIME (Patient not taking: Reported on 06/09/2017) 90 tablet 2  . nitroGLYCERIN (NITROSTAT) 0.4 MG SL tablet PLACE 1 TABLET UNDER THE TONGUE EVERY 5 MINUTES AS NEEDED FOR CHEST PAIN 25 tablet 3    No results found for this or any previous visit (from the past 48 hour(s)). No results found.  ROS  Blood pressure 131/78, pulse 68, temperature 99 F (37.2 C), temperature source Oral, resp. rate 11, height 6\' 1"  (1.854 m), weight 220 lb (99.8 kg), SpO2 98 %. Physical Exam  Constitutional: He appears well-developed and well-nourished.  HENT:  Mouth/Throat: Oropharynx is clear and moist.  Dentition in satisfactory condition..  Eyes: Conjunctivae are normal.  Neck: No thyromegaly present.  Cardiovascular: Normal rate, regular rhythm and normal heart sounds.  No murmur heard. Respiratory: Effort normal and breath sounds normal.  Midsternal scar.  GI: Soft. He exhibits no distension and no mass. There is no tenderness.  Musculoskeletal: He exhibits no edema.   Lymphadenopathy:    He has no cervical adenopathy.  Neurological: He is alert.  Skin: Skin is warm and dry.     Assessment/Plan Noted food dysphagia. GERD complicated by short segment Barrett's esophagus. EGD with ED.  Hildred Laser, MD 06/16/2017, 1:28 PM

## 2017-06-16 NOTE — Op Note (Signed)
[facility] Patient Name: [name] Procedure Date: 06/16/2017 12:41 PM MRN: [id number] Date of Birth: Mar 11, 1945 Attending MD: Hildred Laser , MD CSN: [id number] Age: 72 Admit Type: Outpatient Procedure:                Upper GI endoscopy Indications:              Esophageal dysphagia Providers:                Hildred Laser, MD, Charlsie Quest. Theda Sers RN, RN, Nelma Rothman, Technician Referring MD:             Halford Chessman, MD Medicines:                Lidocaine spray, Meperidine 25 mg IV, Midazolam 3                            mg IV Complications:            No immediate complications. Estimated Blood Loss:     Estimated blood loss was minimal. Procedure:                Pre-Anesthesia Assessment:                           - Prior to the procedure, a History and Physical                            was performed, and patient medications and                            allergies were reviewed. The patient's tolerance of                            previous anesthesia was also reviewed. The risks                            and benefits of the procedure and the sedation                            options and risks were discussed with the patient.                            All questions were answered, and informed consent                            was obtained. Prior Anticoagulants: The patient                            last took aspirin 2 days, Celebrex (celecoxib) 1                            day and Plavix (clopidogrel) 6 days prior to the  procedure. ASA Grade Assessment: III - A patient                            with severe systemic disease. After reviewing the                            risks and benefits, the patient was deemed in                            satisfactory condition to undergo the procedure.                           After obtaining informed consent, the endoscope was                            passed under  direct vision. Throughout the                            procedure, the patient's blood pressure, pulse, and                            oxygen saturations were monitored continuously. The                            EG-[id number] ([id number]) scope was introduced through the                            mouth, and advanced to the second part of duodenum.                            The upper GI endoscopy was accomplished without                            difficulty. The patient tolerated the procedure                            well. Scope In: 1:37:22 PM Scope Out: 1:50:15 PM Total Procedure Duration: 0 hours 12 minutes 53 seconds  Findings:      The proximal esophagus and mid esophagus were normal.      There were esophageal mucosal changes secondary to established       short-segment Barrett's disease present in the distal esophagus. The       maximum longitudinal extent of these mucosal changes was 15 cm in length.      The Z-line was irregular and was found 41 cm from the incisors.      No endoscopic abnormality was evident in the esophagus to explain the       patient's complaint of dysphagia. It was decided, however, to proceed       with dilation of the entire esophagus. The scope was withdrawn. Dilation       was performed with a Maloney dilator with no resistance at 56 Fr.      Four spots with no bleeding and no stigmata of recent bleeding were       found in the gastric antrum and  in the prepyloric region of the stomach.       Biopsies were taken with a cold forceps for histology.      The exam of the stomach was otherwise normal.      The duodenal bulb and second portion of the duodenum were normal. Impression:               - Normal proximal esophagus and mid esophagus.                           - Esophageal mucosal changes secondary to                            established short-segment Barrett's disease.                           - Z-line irregular, 41 cm from the incisors.                            - No endoscopic esophageal abnormality to explain                            patient's dysphagia. Esophagus dilated. Dilated.                           - Four white spots with no bleeding in the stomach.                            Biopsied.                           - Normal duodenal bulb and second portion of the                            duodenum. Moderate Sedation:      Moderate (conscious) sedation was administered by the endoscopy nurse       and supervised by the endoscopist. The following parameters were       monitored: oxygen saturation, heart rate, blood pressure, CO2       capnography and response to care. Total physician intraservice time was       23 minutes. Recommendation:           - Patient has a contact number available for                            emergencies. The signs and symptoms of potential                            delayed complications were discussed with the                            patient. Return to normal activities tomorrow.                            Written discharge instructions were provided to the  patient.                           - Resume previous diet today.                           - No aspirin, ibuprofen, naproxen, or other                            non-steroidal anti-inflammatory drugs for 1 day.                           - Continue present medications.                           - Await pathology results. Procedure Code(s):        --- Professional ---                           [phone], Esophagogastroduodenoscopy, flexible,                            transoral; with biopsy, single or multiple                           43450, Dilation of esophagus, by unguided sound or                            bougie, single or multiple passes                           G0500, Moderate sedation services provided by the                            same physician or other qualified health care                             professional performing a gastrointestinal                            endoscopic service that sedation supports,                            requiring the presence of an independent trained                            observer to assist in the monitoring of the                            patient's level of consciousness and physiological                            status; initial 15 minutes of intra-service time;                            patient age 44 years or older (additional time  may                            be reported with [phone], as appropriate)                           [id number], Moderate sedation services provided by the                            same physician or other qualified health care                            professional performing the diagnostic or                            therapeutic service that the sedation supports,                            requiring the presence of an independent trained                            observer to assist in the monitoring of the                            patient's level of consciousness and physiological                            status; each additional 15 minutes intraservice                            time (List separately in addition to code for                            primary service) Diagnosis Code(s):        --- Professional ---                           K22.70, Barrett's esophagus without dysplasia                           K22.8, Other specified diseases of esophagus                           K31.89, Other diseases of stomach and duodenum                           R13.14, Dysphagia, pharyngoesophageal phase CPT copyright 2017 American Medical Association. All rights reserved. The codes documented in this report are preliminary and upon coder review may  be revised to meet current compliance requirements. Hildred Laser, MD Hildred Laser, MD 06/16/2017 2:11:02 PM This report has been signed electronically. Number of  Addenda: 0

## 2017-06-22 DIAGNOSIS — R42 Dizziness and giddiness: Secondary | ICD-10-CM | POA: Diagnosis not present

## 2017-06-23 ENCOUNTER — Encounter (HOSPITAL_COMMUNITY): Payer: Self-pay | Admitting: Internal Medicine

## 2017-07-19 DIAGNOSIS — Z7689 Persons encountering health services in other specified circumstances: Secondary | ICD-10-CM | POA: Diagnosis not present

## 2017-07-19 DIAGNOSIS — G894 Chronic pain syndrome: Secondary | ICD-10-CM | POA: Diagnosis not present

## 2017-07-19 DIAGNOSIS — G629 Polyneuropathy, unspecified: Secondary | ICD-10-CM | POA: Diagnosis not present

## 2017-09-28 DIAGNOSIS — Z23 Encounter for immunization: Secondary | ICD-10-CM | POA: Diagnosis not present

## 2017-09-28 DIAGNOSIS — Z1389 Encounter for screening for other disorder: Secondary | ICD-10-CM | POA: Diagnosis not present

## 2017-09-28 DIAGNOSIS — Z0001 Encounter for general adult medical examination with abnormal findings: Secondary | ICD-10-CM | POA: Diagnosis not present

## 2017-09-28 DIAGNOSIS — R319 Hematuria, unspecified: Secondary | ICD-10-CM | POA: Diagnosis not present

## 2017-09-29 ENCOUNTER — Other Ambulatory Visit: Payer: Self-pay | Admitting: Cardiovascular Disease

## 2017-09-29 DIAGNOSIS — I200 Unstable angina: Secondary | ICD-10-CM

## 2017-09-29 DIAGNOSIS — I10 Essential (primary) hypertension: Secondary | ICD-10-CM

## 2017-09-29 DIAGNOSIS — R011 Cardiac murmur, unspecified: Secondary | ICD-10-CM

## 2017-11-11 ENCOUNTER — Encounter: Payer: Self-pay | Admitting: Cardiovascular Disease

## 2017-11-11 ENCOUNTER — Ambulatory Visit: Payer: Medicare Other | Admitting: Cardiovascular Disease

## 2017-11-11 VITALS — BP 148/80 | HR 65 | Ht 73.0 in | Wt 220.1 lb

## 2017-11-11 DIAGNOSIS — I25119 Atherosclerotic heart disease of native coronary artery with unspecified angina pectoris: Secondary | ICD-10-CM | POA: Diagnosis not present

## 2017-11-11 DIAGNOSIS — E782 Mixed hyperlipidemia: Secondary | ICD-10-CM

## 2017-11-11 DIAGNOSIS — I10 Essential (primary) hypertension: Secondary | ICD-10-CM | POA: Diagnosis not present

## 2017-11-11 MED ORDER — ISOSORBIDE MONONITRATE ER 60 MG PO TB24: 60.0000 mg | ORAL_TABLET | Freq: Every day | ORAL | 3 refills | Status: DC

## 2017-11-11 NOTE — Progress Notes (Signed)
Cardiology Office Note:    Date:  11/11/2017   ID:  Mack, Steve May 16, 1945, MRN [id number]  PCP:  Sharilyn Sites, MD  Cardiologist:  Sherren Mocha, MD  Electrophysiologist:  None   Referring MD: Sharilyn Sites, MD   Chief Complaint  Patient presents with  . Coronary Artery Disease    History of Present Illness:    [name] is a 72 y.o. male with a hx of coronary artery disease, presenting for follow-up evaluation.  The patient underwent two-vessel CABG with a LIMA to LAD and RIMA to RCA in 1997.  Other cardiovascular problems include hypertension and hyperlipidemia.  The patient underwent PCI of the distal right coronary artery through the [place] graft in 2017 when he presented with exertional angina and an abnormal nuclear stress test.  His most recent heart catheterization demonstrated patency of his bypass grafts and stent sites.  The patient is here alone today.  He reports stable symptoms of intermittent chest discomfort.  States this generally occurs when he is out doing yard work.  He describes a pressure-like sensation on the left side of the chest, nonradiating.  Symptoms resolved within about 1 minute of rest.  He was having similar symptoms last year when he underwent his heart catheterization study.  There have been no progressive symptoms.  He has not required sublingual nitroglycerin.  He does admit to shortness of breath with physical exertion, also without progressive symptoms.  No orthopnea, PND, or leg swelling.  Past Medical History:  Diagnosis Date  . Anginal pain ([place])   . Arthritis    "all over"  . Chronic lower back pain   . Coronary artery disease    a. s/p CABG x 2 (LIMA->LAD, RIMA->RCA);  b. s/p multiple PCI's to Ramus;  c. 08/2011 Cath/PCI: LM 70% into ramus with 70-80% there->treated wtih 3.5x18 Xience Xpedition DES, LCX  nonobs, RCA occluded.  RIMA & LIMA patent, EF 55-65%  . DJD (degenerative joint disease)   . GERD (gastroesophageal  reflux disease)   . History of gout   . Hyperlipidemia   . Hypertension   . Hypothyroidism   . Neuropathy   . OSA on CPAP   . Prostate cancer [facility])     Past Surgical History:  Procedure Laterality Date  . ABDOMINAL HERNIA REPAIR    . BIOPSY  11/26/2015   Procedure: BIOPSY;  Surgeon: Rogene Houston, MD;  [facility];  Service: Endoscopy;;  gastric esophagus  . CARDIAC CATHETERIZATION N/A 04/10/2015   Procedure: Left Heart Cath and Cors/Grafts Angiography;  Surgeon: Sherren Mocha, MD;  Location: [place] CV LAB;  Service: Cardiovascular;  Laterality: N/A;  . CARDIAC CATHETERIZATION N/A 04/10/2015   Procedure: Coronary Stent Intervention;  Surgeon: Sherren Mocha, MD;  Location: [place] CV LAB;  Service: Cardiovascular;  Laterality: N/A;  . CATARACT EXTRACTION W/PHACO Right 06/23/2015   Procedure: CATARACT EXTRACTION PHACO AND INTRAOCULAR LENS PLACEMENT RIGHT EYE; CDE:  7.08;  Surgeon: Tonny Branch, MD;  Location: [facility];  Service: Ophthalmology;  Laterality: Right;  . CATARACT EXTRACTION W/PHACO Left 08/25/2015   Procedure: CATARACT EXTRACTION PHACO AND INTRAOCULAR LENS PLACEMENT LEFT EYE; CDE:  8.18;  Surgeon: Tonny Branch, MD;  Location: [facility];  Service: Ophthalmology;  Laterality: Left;  . COLONOSCOPY N/A 12/28/2012   Procedure: COLONOSCOPY;  Surgeon: Rogene Houston, MD;  [facility];  Service: Endoscopy;  Laterality: N/A;  830 rescheduled  . CORONARY ANGIOPLASTY WITH STENT PLACEMENT  2013; 04/10/2015   "  this makes me a total of 7" (04/10/2015)  . CORONARY ARTERY BYPASS GRAFT  1997   with (LIMA)  . ESOPHAGEAL DILATION N/A 11/26/2015   Procedure: ESOPHAGEAL DILATION;  Surgeon: Rogene Houston, MD;  [facility];  Service: Endoscopy;  Laterality: N/A;  . ESOPHAGEAL DILATION N/A 05/23/2017   Procedure: ESOPHAGEAL DILATION;  Surgeon: Rogene Houston, MD;  [facility];  Service: Endoscopy;  Laterality: N/A;  . ESOPHAGEAL DILATION N/A  06/16/2017   Procedure: ESOPHAGEAL DILATION;  Surgeon: Rogene Houston, MD;  [facility];  Service: Endoscopy;  Laterality: N/A;  . ESOPHAGOGASTRODUODENOSCOPY N/A 11/26/2015   Procedure: ESOPHAGOGASTRODUODENOSCOPY (EGD);  Surgeon: Rogene Houston, MD;  [facility];  Service: Endoscopy;  Laterality: N/A;  1:25  . ESOPHAGOGASTRODUODENOSCOPY N/A 05/23/2017   Procedure: ESOPHAGOGASTRODUODENOSCOPY (EGD);  Surgeon: Rogene Houston, MD;  [facility];  Service: Endoscopy;  Laterality: N/A;  1:15  . ESOPHAGOGASTRODUODENOSCOPY N/A 06/16/2017   Procedure: ESOPHAGOGASTRODUODENOSCOPY (EGD);  Surgeon: Rogene Houston, MD;  [facility];  Service: Endoscopy;  Laterality: N/A;  1240  . HERNIA REPAIR Left   . KNEE CARTILAGE SURGERY Bilateral   . LEFT HEART CATH AND CORS/GRAFTS ANGIOGRAPHY N/A 09/08/2016   Procedure: LEFT HEART CATH AND CORS/GRAFTS ANGIOGRAPHY;  Surgeon: Martinique, Peter M, MD;  Location: [place] CV LAB;  Service: Cardiovascular;  Laterality: N/A;  . LEFT HEART CATHETERIZATION WITH CORONARY ANGIOGRAM N/A 09/08/2011   Procedure: LEFT HEART CATHETERIZATION WITH CORONARY ANGIOGRAM;  Surgeon: Sherren Mocha, MD;  Location: [facility] CATH LAB;  Service: Cardiovascular;  Laterality: N/A;  . PERCUTANEOUS CORONARY STENT INTERVENTION (PCI-S) Right 09/08/2011   Procedure: PERCUTANEOUS CORONARY STENT INTERVENTION (PCI-S);  Surgeon: Sherren Mocha, MD;  Location: [facility] C[facility] LAB;  Service: Cardiovascular;  Laterality: Right;  . ROBOT ASSISTED LAPAROSCOPIC RADICAL PROSTATECTOMY      Current Medications: Current Meds  Medication Sig  . amLODipine (NORVASC) 5 MG tablet TAKE 1 TABLET BY MOUTH EVERY DAY  . aspirin 81 MG tablet Take 1 tablet (81 mg total) by mouth at bedtime.  Marland Kitchen atorvastatin (LIPITOR) 40 MG tablet Take 1 tablet (40 mg total) by mouth daily at 6 PM. Please make overdue appt with Dr. Burt Knack before anymore refills. 1st attempt  . Biotin 5000 MCG CAPS Take 5,000 mcg  by mouth daily.   . celecoxib (CELEBREX) 200 MG capsule Take 1 capsule (200 mg total) by mouth daily.  . clopidogrel (PLAVIX) 75 MG tablet Take 1 tablet (75 mg total) by mouth every morning.  . Coenzyme Q10 (COQ10) 200 MG CAPS Take 200 mg by mouth every evening.   . cyanocobalamin (,VITAMIN B-12,) 1000 MCG/ML injection Inject 1,000 mcg into the muscle See admin instructions. Monthly as needed  . gabapentin (NEURONTIN) 600 MG tablet Take 600 mg by mouth 3 (three) times daily.  Marland Kitchen glucosamine-chondroitin 500-400 MG tablet Take 1 tablet by mouth daily.  . isosorbide mononitrate (IMDUR) 60 MG 24 hr tablet Take 1 tablet (60 mg total) by mouth daily.  . Lecith-Inosi-Chol-B12-Liver (LIVERITE PO) Take 1 tablet by mouth daily.  Marland Kitchen levothyroxine (SYNTHROID, LEVOTHROID) 150 MCG tablet Take 150 mcg by mouth daily before breakfast.  . Multiple Vitamin (MULTIVITAMIN) tablet Take 1 tablet by mouth every other day.   . nitroGLYCERIN (NITROSTAT) 0.4 MG SL tablet Place 1 tablet (0.4 mg total) under the tongue every 5 (five) minutes as needed for chest pain. Please make appt with Dr. Burt Knack.1st attempt  . Omega-3 Fatty Acids (  FISH OIL) 1200 MG CAPS Take 1 capsule (1,200 mg total) by mouth 2 (two) times daily.  Marland Kitchen OVER THE COUNTER MEDICATION Take 2 capsules by mouth 2 (two) times daily. Lactin Probiotic  . Polyvinyl Alcohol-Povidone (REFRESH OP) Place 1 drop into both eyes daily as needed (dry eyes).  . traMADol (ULTRAM) 50 MG tablet Take 50 mg by mouth 3 (three) times daily.   . [DISCONTINUED] isosorbide mononitrate (IMDUR) 30 MG 24 hr tablet Take 1 tablet (30 mg total) by mouth daily. Please make yearly appt with Dr. Burt Knack for September for future refills. 1st attempt     Allergies:   Codeine and Pollen extract   Social History   Socioeconomic History  . Marital status: Widowed    Spouse name: Not on file  . Number of children: Not on file  . Years of education: Not on file  . Highest education level: Not on  file  Occupational History  . Occupation: semi-retired  Social Needs  . Financial resource strain: Not on file  . Food insecurity:    Worry: Not on file    Inability: Not on file  . Transportation needs:    Medical: Not on file    Non-medical: Not on file  Tobacco Use  . Smoking status: Former Smoker    Years: 12.00    Types: Cigars  . Smokeless tobacco: Never Used  . Tobacco comment: "quit smoking in ~ 1978"  Substance and Sexual Activity  . Alcohol use: Yes    Comment: 04/10/2015 "I've drank 4 beers in the last 2 months"  . Drug use: No  . Sexual activity: Not Currently  Lifestyle  . Physical activity:    Days per week: Not on file    Minutes per session: Not on file  . Stress: Not on file  Relationships  . Social connections:    Talks on phone: Not on file    Gets together: Not on file    Attends religious service: Not on file    Active member of club or organization: Not on file    Attends meetings of clubs or organizations: Not on file    Relationship status: Not on file  Other Topics Concern  . Not on file  Social History Narrative  . Not on file     Family History: The patient's family history includes Cancer (age of onset: 74) in his father; Stroke (age of onset: 49) in his mother.  ROS:   Please see the history of present illness.    Positive for chest pain, shortness of breath with activity, blood in urine, back pain, easy bruising.  All other systems reviewed and are negative.  EKGs/Labs/Other Studies Reviewed:    The following studies were reviewed today: 2018 cardiac catheterization images are reviewed today.  EKG:  EKG is ordered today.  The ekg ordered today demonstrates normal sinus rhythm 65 bpm, incomplete right bundle branch block, nonspecific ST and T wave abnormality.  Recent Labs: No results found for requested labs within last 8760 hours.  Recent Lipid Panel No results found for: CHOL, TRIG, HDL, CHOLHDL, VLDL, LDLCALC,  LDLDIRECT  Physical Exam:    VS:  BP (!) 148/80   Pulse 65   Ht 6\' 1"  (1.854 m)   Wt 220 lb 1.9 oz (99.8 kg)   SpO2 97%   BMI 29.04 kg/m     Wt Readings from Last 3 Encounters:  11/11/17 220 lb 1.9 oz (99.8 kg)  06/16/17 220 lb (99.8  kg)  04/18/17 225 lb 4.8 oz (102.2 kg)     GEN: Well nourished, well developed in no acute distress HEENT: Normal NECK: No JVD; No carotid bruits LYMPHATICS: No lymphadenopathy CARDIAC: RRR, 2/6 systolic ejection murmur at the right upper sternal border, no diastolic murmur RESPIRATORY:  Clear to auscultation without rales, wheezing or rhonchi  ABDOMEN: Soft, non-tender, non-distended MUSCULOSKELETAL:  No edema; No deformity  SKIN: Warm and dry NEUROLOGIC:  Alert and oriented x 3 PSYCHIATRIC:  Normal affect   ASSESSMENT:    1. Coronary artery disease involving native coronary artery of native heart with angina pectoris ([place])   2. Mixed hyperlipidemia   3. Essential hypertension    PLAN:    In order of problems listed above: 1.  The patient has fairly typical symptoms of angina.  Despite this, he was having similar symptoms last year and underwent coronary angiography demonstrating no high-grade obstructive CAD and continued patency of his stents.  He does have some small vessel disease.  I recommended increasing isosorbide to 60 mg daily.  He will continue on his current dose of amlodipine.  He remains on DAPT with aspirin and clopidogrel as well as atorvastatin. 2.  Most recent lipids demonstrate a cholesterol 116, HDL 37, and LDL 49 on atorvastatin 40 mg.  Lifestyle modification as discussed today. 3.  He will continue on amlodipine and isosorbide.  Medication Adjustments/Labs and Tests Ordered: Current medicines are reviewed at length with the patient today.  Concerns regarding medicines are outlined above.  Orders Placed This Encounter  Procedures  . EKG 12-Lead   Meds ordered this encounter  Medications  . isosorbide mononitrate  (IMDUR) 60 MG 24 hr tablet    Sig: Take 1 tablet (60 mg total) by mouth daily.    Dispense:  90 tablet    Refill:  3    Patient Instructions  Medication Instructions:  1) INCREASE ISOSORBIDE to 60 mg daily  Labwork: None  Testing/Procedures: None  Follow-Up: Your provider wants you to follow-up in: 1 year with Dr. Burt Knack. You will receive a reminder letter in the mail two months in advance. If you don't receive a letter, please call our office to schedule the follow-up appointment.    Any Other Special Instructions Will Be Listed Below (If Applicable).     If you need a refill on your cardiac medications before your next appointment, please call your pharmacy.      Signed, Sherren Mocha, MD  11/11/2017 5:13 PM    [facility]

## 2017-11-11 NOTE — Patient Instructions (Signed)
Medication Instructions:  1) INCREASE ISOSORBIDE to 60 mg daily  Labwork: None  Testing/Procedures: None  Follow-Up: Your provider wants you to follow-up in: 1 year with Dr. Burt Knack. You will receive a reminder letter in the mail two months in advance. If you don't receive a letter, please call our office to schedule the follow-up appointment.    Any Other Special Instructions Will Be Listed Below (If Applicable).     If you need a refill on your cardiac medications before your next appointment, please call your pharmacy.

## 2017-11-16 ENCOUNTER — Other Ambulatory Visit: Payer: Self-pay | Admitting: Cardiovascular Disease

## 2017-11-29 ENCOUNTER — Ambulatory Visit (INDEPENDENT_AMBULATORY_CARE_PROVIDER_SITE_OTHER): Payer: Medicare Other | Admitting: Internal Medicine

## 2017-11-29 ENCOUNTER — Encounter (INDEPENDENT_AMBULATORY_CARE_PROVIDER_SITE_OTHER): Payer: Self-pay | Admitting: Internal Medicine

## 2017-11-29 VITALS — BP 140/80 | HR 72 | Temp 98.1°F | Ht 73.0 in | Wt 224.2 lb

## 2017-11-29 DIAGNOSIS — K2270 Barrett's esophagus without dysplasia: Secondary | ICD-10-CM

## 2017-11-29 NOTE — Progress Notes (Signed)
Subjective:    Patient ID: [name], male    DOB: 02-05-1945, 72 y.o.   MRN: [id number]  HPI Here today for f/u. Underwent an EGD in July of this for dysphagia.  Impression:               - Normal proximal esophagus and mid esophagus.                           - Esophageal mucosal changes secondary to                            established short-segment Barrett's disease.                           - Z-line irregular, 41 cm from the incisors.                           - No endoscopic esophageal abnormality to explain                            patient's dysphagia. Esophagus dilated. Dilated.                           - Four white spots with no bleeding in the stomach.                            Biopsied.                           - Normal duodenal bulb and second portion of the                            duodenum. He tells me he is having angina.  Saw cardiology and was told it was not cardiac. Has chest pain comes and goes. He says if he stops for a few minutes, the pain will resolved. Has had this pain x 3 years.  Hx of atrial fib and maintained on Plavix. Rarely has dysphagia.   Review of Systems Past Medical History:  Diagnosis Date  . Anginal pain ([place])   . Arthritis    "all over"  . Chronic lower back pain   . Coronary artery disease    a. s/p CABG x 2 (LIMA->LAD, RIMA->RCA);  b. s/p multiple PCI's to Ramus;  c. 08/2011 Cath/PCI: LM 70% into ramus with 70-80% there->treated wtih 3.5x18 Xience Xpedition DES, LCX  nonobs, RCA occluded.  RIMA & LIMA patent, EF 55-65%  . DJD (degenerative joint disease)   . GERD (gastroesophageal reflux disease)   . History of gout   . Hyperlipidemia   . Hypertension   . Hypothyroidism   . Neuropathy   . OSA on CPAP   . Prostate cancer [facility])     Past Surgical History:  Procedure Laterality Date  . ABDOMINAL HERNIA REPAIR    . BIOPSY  11/26/2015   Procedure: BIOPSY;  Surgeon: Rogene Houston, MD;  [facility];   Service: Endoscopy;;  gastric esophagus  . CARDIAC CATHETERIZATION N/A 04/10/2015   Procedure: Left Heart Cath and Cors/Grafts Angiography;  Surgeon: Sherren Mocha, MD;  Location:  [place] INVASIVE CV LAB;  Service: Cardiovascular;  Laterality: N/A;  . CARDIAC CATHETERIZATION N/A 04/10/2015   Procedure: Coronary Stent Intervention;  Surgeon: Sherren Mocha, MD;  Location: [place] CV LAB;  Service: Cardiovascular;  Laterality: N/A;  . CATARACT EXTRACTION W/PHACO Right 06/23/2015   Procedure: CATARACT EXTRACTION PHACO AND INTRAOCULAR LENS PLACEMENT RIGHT EYE; CDE:  7.08;  Surgeon: Tonny Branch, MD;  Location: [facility] ORS;  Service: Ophthalmology;  Laterality: Right;  . CATARACT EXTRACTION W/PHACO Left 08/25/2015   Procedure: CATARACT EXTRACTION PHACO AND INTRAOCULAR LENS PLACEMENT LEFT EYE; CDE:  8.18;  Surgeon: Tonny Branch, MD;  Location: [facility] ORS;  Service: Ophthalmology;  Laterality: Left;  . COLONOSCOPY N/A 12/28/2012   Procedure: COLONOSCOPY;  Surgeon: Rogene Houston, MD;  [facility];  Service: Endoscopy;  Laterality: N/A;  830 rescheduled  . CORONARY ANGIOPLASTY WITH STENT PLACEMENT  2013; 04/10/2015   "this makes me a total of 7" (04/10/2015)  . CORONARY ARTERY BYPASS GRAFT  1997   with (LIMA)  . ESOPHAGEAL DILATION N/A 11/26/2015   Procedure: ESOPHAGEAL DILATION;  Surgeon: Rogene Houston, MD;  [facility];  Service: Endoscopy;  Laterality: N/A;  . ESOPHAGEAL DILATION N/A 05/23/2017   Procedure: ESOPHAGEAL DILATION;  Surgeon: Rogene Houston, MD;  [facility];  Service: Endoscopy;  Laterality: N/A;  . ESOPHAGEAL DILATION N/A 06/16/2017   Procedure: ESOPHAGEAL DILATION;  Surgeon: Rogene Houston, MD;  [facility];  Service: Endoscopy;  Laterality: N/A;  . ESOPHAGOGASTRODUODENOSCOPY N/A 11/26/2015   Procedure: ESOPHAGOGASTRODUODENOSCOPY (EGD);  Surgeon: Rogene Houston, MD;  [facility];  Service: Endoscopy;  Laterality: N/A;  1:25  .  ESOPHAGOGASTRODUODENOSCOPY N/A 05/23/2017   Procedure: ESOPHAGOGASTRODUODENOSCOPY (EGD);  Surgeon: Rogene Houston, MD;  [facility];  Service: Endoscopy;  Laterality: N/A;  1:15  . ESOPHAGOGASTRODUODENOSCOPY N/A 06/16/2017   Procedure: ESOPHAGOGASTRODUODENOSCOPY (EGD);  Surgeon: Rogene Houston, MD;  [facility];  Service: Endoscopy;  Laterality: N/A;  1240  . HERNIA REPAIR Left   . KNEE CARTILAGE SURGERY Bilateral   . LEFT HEART CATH AND CORS/GRAFTS ANGIOGRAPHY N/A 09/08/2016   Procedure: LEFT HEART CATH AND CORS/GRAFTS ANGIOGRAPHY;  Surgeon: Martinique, Peter M, MD;  Location: [place] CV LAB;  Service: Cardiovascular;  Laterality: N/A;  . LEFT HEART CATHETERIZATION WITH CORONARY ANGIOGRAM N/A 09/08/2011   Procedure: LEFT HEART CATHETERIZATION WITH CORONARY ANGIOGRAM;  Surgeon: Sherren Mocha, MD;  Location: [facility] CATH LAB;  Service: Cardiovascular;  Laterality: N/A;  . PERCUTANEOUS CORONARY STENT INTERVENTION (PCI-S) Right 09/08/2011   Procedure: PERCUTANEOUS CORONARY STENT INTERVENTION (PCI-S);  Surgeon: Sherren Mocha, MD;  Location: [facility] CATH LAB;  Service: Cardiovascular;  Laterality: Right;  . ROBOT ASSISTED LAPAROSCOPIC RADICAL PROSTATECTOMY      Allergies  Allergen Reactions  . Codeine Nausea Only  . Pollen Extract Other (See Comments)    Seasonal    Current Outpatient Medications on File Prior to Visit  Medication Sig Dispense Refill  . aspirin 81 MG tablet Take 1 tablet (81 mg total) by mouth at bedtime. 30 tablet   . atorvastatin (LIPITOR) 40 MG tablet TAKE 1 TABLET BY MOUTH EVERY DAY AT 6 PM 90 tablet 3  . Biotin 5000 MCG CAPS Take 5,000 mcg by mouth daily.     . celecoxib (CELEBREX) 200 MG capsule Take 1 capsule (200 mg total) by mouth daily.    . clopidogrel (PLAVIX) 75 MG tablet Take 1 tablet (75 mg total) by mouth every  morning. 30 tablet 11  . Coenzyme Q10 (COQ10) 200 MG CAPS Take 200 mg by mouth every evening.     . cyanocobalamin (,VITAMIN B-12,) 1000  MCG/ML injection Inject 1,000 mcg into the muscle See admin instructions. Monthly as needed  2  . gabapentin (NEURONTIN) 600 MG tablet Take 600 mg by mouth 3 (three) times daily.  3  . glucosamine-chondroitin 500-400 MG tablet Take 1 tablet by mouth daily.    . isosorbide mononitrate (IMDUR) 60 MG 24 hr tablet Take 1 tablet (60 mg total) by mouth daily. 90 tablet 3  . Lecith-Inosi-Chol-B12-Liver (LIVERITE PO) Take 1 tablet by mouth daily.    Marland Kitchen levothyroxine (SYNTHROID, LEVOTHROID) 150 MCG tablet Take 150 mcg by mouth daily before breakfast.    . Multiple Vitamin (MULTIVITAMIN) tablet Take 1 tablet by mouth every other day.     . nitroGLYCERIN (NITROSTAT) 0.4 MG SL tablet Place 1 tablet (0.4 mg total) under the tongue every 5 (five) minutes as needed for chest pain. Please make appt with Dr. Burt Knack.1st attempt 25 tablet 0  . Omega-3 Fatty Acids (FISH OIL) 1200 MG CAPS Take 1 capsule (1,200 mg total) by mouth 2 (two) times daily.    Marland Kitchen OVER THE COUNTER MEDICATION Take 2 capsules by mouth 2 (two) times daily. Lactin Probiotic    . Polyvinyl Alcohol-Povidone (REFRESH OP) Place 1 drop into both eyes daily as needed (dry eyes).    . traMADol (ULTRAM) 50 MG tablet Take 50 mg by mouth 3 (three) times daily.   1   No current facility-administered medications on file prior to visit.         Objective:   Physical Exam Blood pressure 140/80, pulse 72, temperature 98.1 F (36.7 C), height 6\' 1"  (1.854 m), weight 224 lb 3.2 oz (101.7 kg).  Blood pressure 140/80, pulse 72, temperature 98.1 F (36.7 C), height 6\' 1"  (1.854 m), weight 224 lb 3.2 oz (101.7 kg). Alert and oriented. Skin warm and dry. Oral mucosa is moist.   . Sclera anicteric, conjunctivae is pink. Thyroid not enlarged. No cervical lymphadenopathy. Lungs clear. Heart regular rate and rhythm. Murmur heard. Abdomen is soft. Bowel sounds are positive. No hepatomegaly. No abdominal masses felt. No tenderness.  No edema to lower extremities.   .        Assessment & Plan:  Chest pain. Normal EGD in June.  Continue present medications.  Barrett's esophagus: Prilosec OTC OV in 1 year.

## 2017-11-29 NOTE — Patient Instructions (Signed)
Prilosec OTC

## 2017-11-30 DIAGNOSIS — G894 Chronic pain syndrome: Secondary | ICD-10-CM | POA: Diagnosis not present

## 2017-12-09 ENCOUNTER — Other Ambulatory Visit: Payer: Self-pay | Admitting: Cardiovascular Disease

## 2017-12-13 DIAGNOSIS — R319 Hematuria, unspecified: Secondary | ICD-10-CM | POA: Diagnosis not present

## 2017-12-13 DIAGNOSIS — N342 Other urethritis: Secondary | ICD-10-CM | POA: Diagnosis not present

## 2017-12-15 ENCOUNTER — Other Ambulatory Visit: Payer: Self-pay

## 2017-12-15 ENCOUNTER — Other Ambulatory Visit: Payer: Self-pay | Admitting: Cardiovascular Disease

## 2017-12-15 MED ORDER — CLOPIDOGREL BISULFATE 75 MG PO TABS: 75.0000 mg | ORAL_TABLET | Freq: Every morning | ORAL | 11 refills | Status: DC

## 2017-12-15 NOTE — Patient Outreach (Signed)
[facility]) Care Management  12/15/2017  Kimsey Demaree [facility] 1945/10/24 [id number]   Medication Adherence call to Mr. Jovanne Riggenbach spoke with patient he is due on Lisinopril 5 mg patient said he is no longer taking this medication doctor took him off. Mr. Longmore is showing past due under [place].   [facility] Management Direct Dial [phone]  Fax [phone] [email or web address]

## 2017-12-21 DIAGNOSIS — N342 Other urethritis: Secondary | ICD-10-CM | POA: Diagnosis not present

## 2017-12-21 DIAGNOSIS — R319 Hematuria, unspecified: Secondary | ICD-10-CM | POA: Diagnosis not present

## 2018-02-06 DIAGNOSIS — R82998 Other abnormal findings in urine: Secondary | ICD-10-CM | POA: Diagnosis not present

## 2018-02-06 DIAGNOSIS — C672 Malignant neoplasm of lateral wall of bladder: Secondary | ICD-10-CM | POA: Diagnosis not present

## 2018-02-06 DIAGNOSIS — N35010 Post-traumatic urethral stricture, male, meatal: Secondary | ICD-10-CM | POA: Diagnosis not present

## 2018-03-09 ENCOUNTER — Ambulatory Visit: Payer: Medicare Other | Admitting: Cardiovascular Disease

## 2018-03-09 DIAGNOSIS — Z955 Presence of coronary angioplasty implant and graft: Secondary | ICD-10-CM | POA: Diagnosis not present

## 2018-03-09 DIAGNOSIS — Z79899 Other long term (current) drug therapy: Secondary | ICD-10-CM | POA: Diagnosis not present

## 2018-03-09 DIAGNOSIS — Z9989 Dependence on other enabling machines and devices: Secondary | ICD-10-CM | POA: Diagnosis not present

## 2018-03-09 DIAGNOSIS — C678 Malignant neoplasm of overlapping sites of bladder: Secondary | ICD-10-CM | POA: Diagnosis not present

## 2018-03-09 DIAGNOSIS — Z7982 Long term (current) use of aspirin: Secondary | ICD-10-CM | POA: Diagnosis not present

## 2018-03-09 DIAGNOSIS — Z7902 Long term (current) use of antithrombotics/antiplatelets: Secondary | ICD-10-CM | POA: Diagnosis not present

## 2018-03-09 DIAGNOSIS — N35911 Unspecified urethral stricture, male, meatal: Secondary | ICD-10-CM | POA: Diagnosis not present

## 2018-03-09 DIAGNOSIS — Z87891 Personal history of nicotine dependence: Secondary | ICD-10-CM | POA: Diagnosis not present

## 2018-03-09 DIAGNOSIS — E785 Hyperlipidemia, unspecified: Secondary | ICD-10-CM | POA: Diagnosis not present

## 2018-03-09 DIAGNOSIS — I2510 Atherosclerotic heart disease of native coronary artery without angina pectoris: Secondary | ICD-10-CM | POA: Diagnosis not present

## 2018-03-09 DIAGNOSIS — G4733 Obstructive sleep apnea (adult) (pediatric): Secondary | ICD-10-CM | POA: Diagnosis not present

## 2018-03-09 DIAGNOSIS — Z951 Presence of aortocoronary bypass graft: Secondary | ICD-10-CM | POA: Diagnosis not present

## 2018-03-09 DIAGNOSIS — I10 Essential (primary) hypertension: Secondary | ICD-10-CM | POA: Diagnosis not present

## 2018-03-10 DIAGNOSIS — I4510 Unspecified right bundle-branch block: Secondary | ICD-10-CM | POA: Diagnosis not present

## 2018-03-14 DIAGNOSIS — E785 Hyperlipidemia, unspecified: Secondary | ICD-10-CM | POA: Diagnosis not present

## 2018-03-14 DIAGNOSIS — G4733 Obstructive sleep apnea (adult) (pediatric): Secondary | ICD-10-CM | POA: Diagnosis not present

## 2018-03-14 DIAGNOSIS — N3289 Other specified disorders of bladder: Secondary | ICD-10-CM | POA: Diagnosis not present

## 2018-03-14 DIAGNOSIS — Z7982 Long term (current) use of aspirin: Secondary | ICD-10-CM | POA: Diagnosis not present

## 2018-03-14 DIAGNOSIS — C678 Malignant neoplasm of overlapping sites of bladder: Secondary | ICD-10-CM | POA: Diagnosis not present

## 2018-03-14 DIAGNOSIS — Z79899 Other long term (current) drug therapy: Secondary | ICD-10-CM | POA: Diagnosis not present

## 2018-03-14 DIAGNOSIS — I2510 Atherosclerotic heart disease of native coronary artery without angina pectoris: Secondary | ICD-10-CM | POA: Diagnosis not present

## 2018-03-14 DIAGNOSIS — Z7902 Long term (current) use of antithrombotics/antiplatelets: Secondary | ICD-10-CM | POA: Diagnosis not present

## 2018-03-14 DIAGNOSIS — Z87891 Personal history of nicotine dependence: Secondary | ICD-10-CM | POA: Diagnosis not present

## 2018-03-14 DIAGNOSIS — Z9989 Dependence on other enabling machines and devices: Secondary | ICD-10-CM | POA: Diagnosis not present

## 2018-03-14 DIAGNOSIS — N35911 Unspecified urethral stricture, male, meatal: Secondary | ICD-10-CM | POA: Diagnosis not present

## 2018-03-14 DIAGNOSIS — Z955 Presence of coronary angioplasty implant and graft: Secondary | ICD-10-CM | POA: Diagnosis not present

## 2018-03-14 DIAGNOSIS — C672 Malignant neoplasm of lateral wall of bladder: Secondary | ICD-10-CM | POA: Diagnosis not present

## 2018-03-14 DIAGNOSIS — I10 Essential (primary) hypertension: Secondary | ICD-10-CM | POA: Diagnosis not present

## 2018-03-14 DIAGNOSIS — Z951 Presence of aortocoronary bypass graft: Secondary | ICD-10-CM | POA: Diagnosis not present

## 2018-03-15 DIAGNOSIS — N35911 Unspecified urethral stricture, male, meatal: Secondary | ICD-10-CM | POA: Diagnosis not present

## 2018-03-15 DIAGNOSIS — I10 Essential (primary) hypertension: Secondary | ICD-10-CM | POA: Diagnosis not present

## 2018-03-15 DIAGNOSIS — Z9989 Dependence on other enabling machines and devices: Secondary | ICD-10-CM | POA: Diagnosis not present

## 2018-03-15 DIAGNOSIS — Z7982 Long term (current) use of aspirin: Secondary | ICD-10-CM | POA: Diagnosis not present

## 2018-03-15 DIAGNOSIS — I2510 Atherosclerotic heart disease of native coronary artery without angina pectoris: Secondary | ICD-10-CM | POA: Diagnosis not present

## 2018-03-15 DIAGNOSIS — Z7902 Long term (current) use of antithrombotics/antiplatelets: Secondary | ICD-10-CM | POA: Diagnosis not present

## 2018-03-15 DIAGNOSIS — C678 Malignant neoplasm of overlapping sites of bladder: Secondary | ICD-10-CM | POA: Diagnosis not present

## 2018-03-15 DIAGNOSIS — Z951 Presence of aortocoronary bypass graft: Secondary | ICD-10-CM | POA: Diagnosis not present

## 2018-03-15 DIAGNOSIS — Z87891 Personal history of nicotine dependence: Secondary | ICD-10-CM | POA: Diagnosis not present

## 2018-03-15 DIAGNOSIS — Z955 Presence of coronary angioplasty implant and graft: Secondary | ICD-10-CM | POA: Diagnosis not present

## 2018-03-15 DIAGNOSIS — G4733 Obstructive sleep apnea (adult) (pediatric): Secondary | ICD-10-CM | POA: Diagnosis not present

## 2018-03-15 DIAGNOSIS — Z79899 Other long term (current) drug therapy: Secondary | ICD-10-CM | POA: Diagnosis not present

## 2018-03-15 DIAGNOSIS — E785 Hyperlipidemia, unspecified: Secondary | ICD-10-CM | POA: Diagnosis not present

## 2018-03-16 DIAGNOSIS — Z7902 Long term (current) use of antithrombotics/antiplatelets: Secondary | ICD-10-CM | POA: Diagnosis not present

## 2018-03-16 DIAGNOSIS — Z79899 Other long term (current) drug therapy: Secondary | ICD-10-CM | POA: Diagnosis not present

## 2018-03-16 DIAGNOSIS — Z9989 Dependence on other enabling machines and devices: Secondary | ICD-10-CM | POA: Diagnosis not present

## 2018-03-16 DIAGNOSIS — G4733 Obstructive sleep apnea (adult) (pediatric): Secondary | ICD-10-CM | POA: Diagnosis not present

## 2018-03-16 DIAGNOSIS — I10 Essential (primary) hypertension: Secondary | ICD-10-CM | POA: Diagnosis not present

## 2018-03-16 DIAGNOSIS — Z7982 Long term (current) use of aspirin: Secondary | ICD-10-CM | POA: Diagnosis not present

## 2018-03-16 DIAGNOSIS — N35911 Unspecified urethral stricture, male, meatal: Secondary | ICD-10-CM | POA: Diagnosis not present

## 2018-03-16 DIAGNOSIS — I2510 Atherosclerotic heart disease of native coronary artery without angina pectoris: Secondary | ICD-10-CM | POA: Diagnosis not present

## 2018-03-16 DIAGNOSIS — Z951 Presence of aortocoronary bypass graft: Secondary | ICD-10-CM | POA: Diagnosis not present

## 2018-03-16 DIAGNOSIS — Z955 Presence of coronary angioplasty implant and graft: Secondary | ICD-10-CM | POA: Diagnosis not present

## 2018-03-16 DIAGNOSIS — E785 Hyperlipidemia, unspecified: Secondary | ICD-10-CM | POA: Diagnosis not present

## 2018-03-16 DIAGNOSIS — Z87891 Personal history of nicotine dependence: Secondary | ICD-10-CM | POA: Diagnosis not present

## 2018-03-16 DIAGNOSIS — C678 Malignant neoplasm of overlapping sites of bladder: Secondary | ICD-10-CM | POA: Diagnosis not present

## 2018-03-17 ENCOUNTER — Encounter (HOSPITAL_COMMUNITY): Payer: Self-pay | Admitting: Emergency Medicine

## 2018-03-17 ENCOUNTER — Emergency Department (HOSPITAL_COMMUNITY)
Admission: EM | Admit: 2018-03-17 | Discharge: 2018-03-17 | Disposition: A | Discharge status: Home / Self Care | Payer: Medicare Other | Attending: Emergency Medicine | Admitting: Emergency Medicine

## 2018-03-17 ENCOUNTER — Other Ambulatory Visit: Payer: Self-pay

## 2018-03-17 DIAGNOSIS — R339 Retention of urine, unspecified: Secondary | ICD-10-CM

## 2018-03-17 DIAGNOSIS — Z87891 Personal history of nicotine dependence: Secondary | ICD-10-CM | POA: Insufficient documentation

## 2018-03-17 DIAGNOSIS — Z7982 Long term (current) use of aspirin: Secondary | ICD-10-CM | POA: Diagnosis not present

## 2018-03-17 DIAGNOSIS — I10 Essential (primary) hypertension: Secondary | ICD-10-CM | POA: Insufficient documentation

## 2018-03-17 DIAGNOSIS — I2510 Atherosclerotic heart disease of native coronary artery without angina pectoris: Secondary | ICD-10-CM | POA: Diagnosis not present

## 2018-03-17 DIAGNOSIS — Z7902 Long term (current) use of antithrombotics/antiplatelets: Secondary | ICD-10-CM | POA: Diagnosis not present

## 2018-03-17 DIAGNOSIS — R310 Gross hematuria: Secondary | ICD-10-CM | POA: Diagnosis not present

## 2018-03-17 DIAGNOSIS — E039 Hypothyroidism, unspecified: Secondary | ICD-10-CM | POA: Insufficient documentation

## 2018-03-17 DIAGNOSIS — Z79899 Other long term (current) drug therapy: Secondary | ICD-10-CM | POA: Diagnosis not present

## 2018-03-17 HISTORY — DX: Neoplasm of unspecified behavior of bladder: D49.4

## 2018-03-17 MED ORDER — LIDOCAINE VISCOUS HCL 2 % MT SOLN: 15.0000 mL | Freq: Once | OROMUCOSAL | Status: DC

## 2018-03-17 MED ORDER — GABAPENTIN 300 MG PO CAPS
600.0000 mg | ORAL_CAPSULE | Freq: Three times a day (TID) | ORAL | Status: DC
  Administered 2018-03-17: 600 mg via ORAL
  Filled 2018-03-17: qty 2

## 2018-03-17 MED ORDER — TRAMADOL HCL 50 MG PO TABS
50.0000 mg | ORAL_TABLET | Freq: Three times a day (TID) | ORAL | Status: DC
  Administered 2018-03-17: 50 mg via ORAL
  Filled 2018-03-17: qty 1

## 2018-03-17 MED ORDER — CELECOXIB 100 MG PO CAPS
200.0000 mg | ORAL_CAPSULE | Freq: Every day | ORAL | Status: DC
  Administered 2018-03-17: 200 mg via ORAL
  Filled 2018-03-17 (×4): qty 2

## 2018-03-17 NOTE — ED Provider Notes (Signed)
[facility] EMERGENCY DEPARTMENT Provider Note   CSN: [id number] Arrival date & time: 03/17/18  4098    History   Chief Complaint Chief Complaint  Patient presents with  . Foley Problem    HPI JAVEL HERSH is a 73 y.o. male.  He has a history of bladder tumors and had a tumor resection done by Dr. Rosana Hoes from [facility].  He said he had a catheter placed yesterday and has been having no output from the catheter since overnight.  He is complaining of pain at the tip of the penis where the catheter goes in and some lower abdominal pain.  No fevers or chills no nausea no vomiting.  He talked to urology overnight and they said come in here to get irrigated. Pain is severe and aching, lower abdomen     The history is provided by the patient.  Male GU Problem  Presenting symptoms: penile pain   Presenting symptoms: no dysuria   Context comment:  Unable to urinate Relieved by:  Nothing Worsened by:  Nothing Ineffective treatments: trying to urinate. Associated symptoms: abdominal pain, hematuria and urinary retention   Associated symptoms: no diarrhea, no fever, no flank pain, no nausea, no scrotal swelling and no vomiting   Risk factors: bladder surgery     Past Medical History:  Diagnosis Date  . Anginal pain ([place])   . Arthritis    "all over"  . Bladder tumor   . Chronic lower back pain   . Coronary artery disease    a. s/p CABG x 2 (LIMA->LAD, RIMA->RCA);  b. s/p multiple PCI's to Ramus;  c. 08/2011 Cath/PCI: LM 70% into ramus with 70-80% there->treated wtih 3.5x18 Xience Xpedition DES, LCX  nonobs, RCA occluded.  RIMA & LIMA patent, EF 55-65%  . DJD (degenerative joint disease)   . GERD (gastroesophageal reflux disease)   . History of gout   . Hyperlipidemia   . Hypertension   . Hypothyroidism   . Neuropathy   . OSA on CPAP   . Prostate cancer [facility])     Patient Active Problem List   Diagnosis Date Noted  . Esophageal dysphagia 05/04/2017  . Chest pain 09/08/2016    . Dysphagia 09/22/2015  . Exertional angina ([place]) 04/10/2015  . Abnormal nuclear cardiac imaging test 04/08/2015  . Aortic stenosis 09/24/2011  . Unstable angina ([place]) 09/09/2011  . Murmur 04/19/2011  . Coronary artery disease 12/08/2010  . Hypothyroidism 09/28/2008  . Hyperlipidemia 09/28/2008  . Essential hypertension 09/28/2008  . CAD, ARTERY BYPASS GRAFT 09/28/2008  . DEGENERATIVE JOINT DISEASE 09/28/2008  . BACK PAIN, CHRONIC 09/28/2008    Past Surgical History:  Procedure Laterality Date  . ABDOMINAL HERNIA REPAIR    . BIOPSY  11/26/2015   Procedure: BIOPSY;  Surgeon: Rogene Houston, MD;  [facility];  Service: Endoscopy;;  gastric esophagus  . BLADDER TUMOR EXCISION    . CARDIAC CATHETERIZATION N/A 04/10/2015   Procedure: Left Heart Cath and Cors/Grafts Angiography;  Surgeon: Sherren Mocha, MD;  Location: [place] CV LAB;  Service: Cardiovascular;  Laterality: N/A;  . CARDIAC CATHETERIZATION N/A 04/10/2015   Procedure: Coronary Stent Intervention;  Surgeon: Sherren Mocha, MD;  Location: [place] CV LAB;  Service: Cardiovascular;  Laterality: N/A;  . CATARACT EXTRACTION W/PHACO Right 06/23/2015   Procedure: CATARACT EXTRACTION PHACO AND INTRAOCULAR LENS PLACEMENT RIGHT EYE; CDE:  7.08;  Surgeon: Tonny Branch, MD;  Location: [facility] ORS;  Service: Ophthalmology;  Laterality: Right;  . CATARACT EXTRACTION  W/PHACO Left 08/25/2015   Procedure: CATARACT EXTRACTION PHACO AND INTRAOCULAR LENS PLACEMENT LEFT EYE; CDE:  8.18;  Surgeon: Tonny Branch, MD;  Location: [facility] OR[facility];  Service: Ophthalmology;  Laterality: Left;  . COLONOSCOPY N/A 12/28/2012   Procedure: COLONOSCOPY;  Surgeon: Rogene Houston, MD;  [facility];  Service: Endoscopy;  Laterality: N/A;  830 rescheduled  . CORONARY ANGIOPLASTY WITH STENT PLACEMENT  2013; 04/10/2015   "this makes me a total of 7" (04/10/2015)  . CORONARY ARTERY BYPASS GRAFT  1997   with (LIMA)  . ESOPHAGEAL DILATION N/A 11/26/2015    Procedure: ESOPHAGEAL DILATION;  Surgeon: Rogene Houston, MD;  [facility];  Service: Endoscopy;  Laterality: N/A;  . ESOPHAGEAL DILATION N/A 05/23/2017   Procedure: ESOPHAGEAL DILATION;  Surgeon: Rogene Houston, MD;  [facility];  Service: Endoscopy;  Laterality: N/A;  . ESOPHAGEAL DILATION N/A 06/16/2017   Procedure: ESOPHAGEAL DILATION;  Surgeon: Rogene Houston, MD;  [facility];  Service: Endoscopy;  Laterality: N/A;  . ESOPHAGOGASTRODUODENOSCOPY N/A 11/26/2015   Procedure: ESOPHAGOGASTRODUODENOSCOPY (EGD);  Surgeon: Rogene Houston, MD;  [facility];  Service: Endoscopy;  Laterality: N/A;  1:25  . ESOPHAGOGASTRODUODENOSCOPY N/A 05/23/2017   Procedure: ESOPHAGOGASTRODUODENOSCOPY (EGD);  Surgeon: Rogene Houston, MD;  [facility];  Service: Endoscopy;  Laterality: N/A;  1:15  . ESOPHAGOGASTRODUODENOSCOPY N/A 06/16/2017   Procedure: ESOPHAGOGASTRODUODENOSCOPY (EGD);  Surgeon: Rogene Houston, MD;  [facility];  Service: Endoscopy;  Laterality: N/A;  1240  . HERNIA REPAIR Left   . KNEE CARTILAGE SURGERY Bilateral   . LEFT HEART CATH AND CORS/GRAFTS ANGIOGRAPHY N/A 09/08/2016   Procedure: LEFT HEART CATH AND CORS/GRAFTS ANGIOGRAPHY;  Surgeon: Martinique, Peter M, MD;  Location: [place] CV LAB;  Service: Cardiovascular;  Laterality: N/A;  . LEFT HEART CATHETERIZATION WITH CORONARY ANGIOGRAM N/A 09/08/2011   Procedure: LEFT HEART CATHETERIZATION WITH CORONARY ANGIOGRAM;  Surgeon: Sherren Mocha, MD;  Location: [facility] CATH LAB;  Service: Cardiovascular;  Laterality: N/A;  . PERCUTANEOUS CORONARY STENT INTERVENTION (PCI-S) Right 09/08/2011   Procedure: PERCUTANEOUS CORONARY STENT INTERVENTION (PCI-S);  Surgeon: Sherren Mocha, MD;  Location: [facility] CATH LAB;  Service: Cardiovascular;  Laterality: Right;  . ROBOT ASSISTED LAPAROSCOPIC RADICAL PROSTATECTOMY          Home Medications    Prior to Admission medications   Medication  Sig Start Date End Date Taking? Authorizing Provider  aspirin 81 MG tablet Take 1 tablet (81 mg total) by mouth at bedtime. 06/17/17   Rogene Houston, MD  atorvastatin (LIPITOR) 40 MG tablet TAKE 1 TABLET BY MOUTH EVERY DAY AT 6 PM 11/16/17   Sherren Mocha, MD  Biotin 5000 MCG CAPS Take 5,000 mcg by mouth daily.     [provider]  celecoxib (CELEBREX) 200 MG capsule Take 1 capsule (200 mg total) by mouth daily. 01/26/12   Hillary Bow, MD  clopidogrel (PLAVIX) 75 MG tablet Take 1 tablet (75 mg total) by mouth every morning. 12/15/17   Sherren Mocha, MD  Coenzyme Q10 (COQ10) 200 MG CAPS Take 200 mg by mouth every evening.     [provider]  cyanocobalamin (,VITAMIN B-12,) 1000 MCG/ML injection Inject 1,000 mcg into the muscle See admin instructions. Monthly as needed 03/25/15   [provider]  gabapentin (NEURONTIN) 600 MG tablet Take 600 mg by mouth 3 (three) times daily. 02/27/15   [provider]  glucosamine-chondroitin 500-400 MG tablet Take  1 tablet by mouth daily.    [provider]  isosorbide mononitrate (IMDUR) 60 MG 24 hr tablet Take 1 tablet (60 mg total) by mouth daily. 11/11/17 11/06/18  Sherren Mocha, MD  isosorbide mononitrate (IMDUR) 60 MG 24 hr tablet Take 1 tablet (60 mg total) by mouth daily. 12/12/17   Sherren Mocha, MD  Lecith-Inosi-Chol-B12-Liver (LIVERITE PO) Take 1 tablet by mouth daily.    [provider]  levothyroxine (SYNTHROID, LEVOTHROID) 150 MCG tablet Take 150 mcg by mouth daily before breakfast.    [provider]  Multiple Vitamin (MULTIVITAMIN) tablet Take 1 tablet by mouth every other day.     [provider]  nitroGLYCERIN (NITROSTAT) 0.4 MG SL tablet Place 1 tablet (0.4 mg total) under the tongue every 5 (five) minutes as needed for chest pain. Please make appt with Dr. Burt Knack.1st attempt 09/29/17   Sherren Mocha, MD  Omega-3 Fatty Acids (FISH OIL) 1200 MG CAPS Take 1 capsule  (1,200 mg total) by mouth 2 (two) times daily. 06/16/17   Rehman, Mechele Dawley, MD  OVER THE COUNTER MEDICATION Take 2 capsules by mouth 2 (two) times daily. Lactin Probiotic    [provider]  Polyvinyl Alcohol-Povidone (REFRESH OP) Place 1 drop into both eyes daily as needed (dry eyes).    [provider]  traMADol (ULTRAM) 50 MG tablet Take 50 mg by mouth 3 (three) times daily.  12/24/13   [provider]    Family History Family History  Problem Relation Age of Onset  . Cancer Father 69       died  . Stroke Mother 76       died    Social History Social History   Tobacco Use  . Smoking status: Former Smoker    Years: 12.00    Types: Cigars  . Smokeless tobacco: Never Used  . Tobacco comment: "quit smoking in ~ 1978"  Substance Use Topics  . Alcohol use: Yes    Comment: 04/10/2015 "I've drank 4 beers in the last 2 months"  . Drug use: No     Allergies   Codeine and Pollen extract   Review of Systems Review of Systems  Constitutional: Negative for fever.  HENT: Negative for sore throat.   Eyes: Negative for visual disturbance.  Respiratory: Negative for shortness of breath.   Cardiovascular: Negative for chest pain.  Gastrointestinal: Positive for abdominal pain. Negative for diarrhea, nausea and vomiting.  Genitourinary: Positive for hematuria and penile pain. Negative for dysuria, flank pain and scrotal swelling.  Musculoskeletal: Negative for neck pain.  Skin: Negative for rash.  Neurological: Negative for headaches.     Physical Exam Updated Vital Signs BP (!) 153/75   Pulse 64   Temp 97.7 F (36.5 C) (Oral)   Resp 12   Ht 6' (1.829 m)   Wt 102.1 kg   SpO2 100%   BMI 30.52 kg/m   Physical Exam Vitals signs and nursing note reviewed.  Constitutional:      Appearance: He is well-developed.  HENT:     Head: Normocephalic and atraumatic.  Eyes:     Conjunctiva/sclera: Conjunctivae normal.  Neck:     Musculoskeletal: Neck  supple.  Cardiovascular:     Rate and Rhythm: Normal rate and regular rhythm.     Heart sounds: No murmur.  Pulmonary:     Effort: Pulmonary effort is normal. No respiratory distress.     Breath sounds: Normal breath sounds.  Abdominal:  Palpations: Abdomen is soft.     Tenderness: There is no abdominal tenderness.  Skin:    General: Skin is warm and dry.     Capillary Refill: Capillary refill takes less than 2 seconds.  Neurological:     General: No focal deficit present.     Mental Status: He is alert and oriented to person, place, and time.      ED Treatments / Results  Labs (all labs ordered are listed, but only abnormal results are displayed) Labs Reviewed  URINE CULTURE    EKG None  Radiology No results found.  Procedures Procedures (including critical care time)  Medications Ordered in ED Medications  lidocaine (XYLOCAINE) 2 % viscous mouth solution 15 mL (has no administration in time range)     Initial Impression / Assessment and Plan / ED Course  I have reviewed the triage vital signs and the nursing notes.  Pertinent labs & imaging results that were available during my care of the patient were reviewed by me and considered in my medical decision making (see chart for details).  Clinical Course as of Mar 17 1036  Fri Mar 17, 2018  0721 Nurses irrigating patient's catheter and getting out bloody urine with some clot.  He had about 400 cc in his bladder prior to irrigation starting.  Patient experiencing improved symptoms.   [MB]  [phone] It is clogged and so I had to self irrigate the patient with a liter of saline and remove some more clot.  He seems to be passing urine on his own now although it still fairly dark red.  We will set him up for CBI.   [MB]  4944 Patient starting on CBI with fairly good clear output.  He understands to stop the inflow and call for help if there is any abdominal discomfort starting.   [MB]  0957 I had to go back and  manually irrigate him 2 more times for when the catheter re-clogged.  Still getting clots.  He is back up to CBI again.   [MB]  9675 Discussed with Dr. Deneise Lever from [facility] urology covering Dr. Rosana Hoes.  He says if it is draining well now the patient could be discharged but if it is bloody or blocks off again he would recommend him coming over to the [facility] ER where they can evaluate and possibly admit.   [MB]    Clinical Course User Index [MB] Hayden Rasmussen, MD       Final Clinical Impressions(s) / ED Diagnoses   Final diagnoses:  Urinary retention  Hematuria, gross    ED Discharge Orders    None       Hayden Rasmussen, MD 03/18/18 1041

## 2018-03-17 NOTE — ED Notes (Signed)
Pt's foley irrigating well. CBI 2nd bag almost done

## 2018-03-17 NOTE — ED Notes (Addendum)
PALS line called, request for Dr. Rosana Hoes to consult with Dr. Melina Copa at [phone]

## 2018-03-17 NOTE — ED Notes (Signed)
Have irrigated patient's Foley. 565mL in and 571mL out. Blood clots returned. NO urine return noted. Will notify MD.

## 2018-03-17 NOTE — ED Notes (Signed)
Pt e signed, but it did not capture on computer

## 2018-03-17 NOTE — ED Notes (Signed)
400 ml after bladder scan.

## 2018-03-17 NOTE — ED Notes (Signed)
Pt requested to walk around. Is walking with no difficulties

## 2018-03-17 NOTE — Discharge Instructions (Signed)
You were seen in the emergency department for a blockage in your Foley catheter.  Your catheter was irrigated multiple times until it was flowing clearly and easily.  You should call your urologist to let them know that you had some problems.  Please drink plenty of fluids and watch that the catheter continues to drain.  If it does not drain he may need to return to the emergency department.

## 2018-03-17 NOTE — ED Triage Notes (Signed)
Pt reports that his Foley catheter is clogged up and he has some blood in his urine. States catheter was placed yesterday. Pt recently had tumors removed from his bladder which is why he has the catheter.

## 2018-03-17 NOTE — ED Notes (Addendum)
Have paged [facility] for CBI

## 2018-03-17 NOTE — ED Notes (Signed)
Pt's CBI is flowing better. Have irrigated it multiple times

## 2018-03-20 ENCOUNTER — Telehealth: Payer: Self-pay | Admitting: Cardiovascular Disease

## 2018-03-20 NOTE — Telephone Encounter (Signed)
  1. What dental office are you calling from? [facility]  2. What is your office phone number? [phone]  3. What is your fax number? [phone]  4. What type of procedure is the patient having performed? Gingivectomy  5. What date is procedure scheduled or is the patient there now? 03/29/18 (if the patient is at the dentist's office question goes to their cardiologist if he/she is in the office.  If not, question should go to the DOD).   6. What is your question (ex. Antibiotics prior to procedure, holding medication-we need to know how long dentist wants pt to hold med)?   Does patient discontinue is Plavix and if so, when?  Please send clearance to [place], Dr Forestine Na.

## 2018-03-21 NOTE — Telephone Encounter (Signed)
Dr. Burt Knack can pt be off plavix for 5 days for gingivectomy?

## 2018-03-23 NOTE — Telephone Encounter (Signed)
Yes he's at low risk and can hold plavix x 5 days. No antibiotics indicated. Thanks you

## 2018-04-28 DIAGNOSIS — N35010 Post-traumatic urethral stricture, male, meatal: Secondary | ICD-10-CM | POA: Diagnosis not present

## 2018-04-28 DIAGNOSIS — C672 Malignant neoplasm of lateral wall of bladder: Secondary | ICD-10-CM | POA: Diagnosis not present

## 2018-05-04 DIAGNOSIS — C672 Malignant neoplasm of lateral wall of bladder: Secondary | ICD-10-CM | POA: Diagnosis not present

## 2018-05-05 DIAGNOSIS — I10 Essential (primary) hypertension: Secondary | ICD-10-CM | POA: Diagnosis not present

## 2018-05-05 DIAGNOSIS — E7849 Other hyperlipidemia: Secondary | ICD-10-CM | POA: Diagnosis not present

## 2018-05-05 DIAGNOSIS — R7309 Other abnormal glucose: Secondary | ICD-10-CM | POA: Diagnosis not present

## 2018-05-05 DIAGNOSIS — E039 Hypothyroidism, unspecified: Secondary | ICD-10-CM | POA: Diagnosis not present

## 2018-05-05 DIAGNOSIS — I25810 Atherosclerosis of coronary artery bypass graft(s) without angina pectoris: Secondary | ICD-10-CM | POA: Diagnosis not present

## 2018-05-05 DIAGNOSIS — C672 Malignant neoplasm of lateral wall of bladder: Secondary | ICD-10-CM | POA: Diagnosis not present

## 2018-05-05 DIAGNOSIS — Z1389 Encounter for screening for other disorder: Secondary | ICD-10-CM | POA: Diagnosis not present

## 2018-05-05 DIAGNOSIS — G894 Chronic pain syndrome: Secondary | ICD-10-CM | POA: Diagnosis not present

## 2018-05-11 DIAGNOSIS — C672 Malignant neoplasm of lateral wall of bladder: Secondary | ICD-10-CM | POA: Diagnosis not present

## 2018-05-18 DIAGNOSIS — C672 Malignant neoplasm of lateral wall of bladder: Secondary | ICD-10-CM | POA: Diagnosis not present

## 2018-05-22 DIAGNOSIS — Z1283 Encounter for screening for malignant neoplasm of skin: Secondary | ICD-10-CM | POA: Diagnosis not present

## 2018-05-22 DIAGNOSIS — D225 Melanocytic nevi of trunk: Secondary | ICD-10-CM | POA: Diagnosis not present

## 2018-05-22 DIAGNOSIS — X32XXXD Exposure to sunlight, subsequent encounter: Secondary | ICD-10-CM | POA: Diagnosis not present

## 2018-05-22 DIAGNOSIS — L570 Actinic keratosis: Secondary | ICD-10-CM | POA: Diagnosis not present

## 2018-05-25 DIAGNOSIS — C672 Malignant neoplasm of lateral wall of bladder: Secondary | ICD-10-CM | POA: Diagnosis not present

## 2018-06-01 DIAGNOSIS — C672 Malignant neoplasm of lateral wall of bladder: Secondary | ICD-10-CM | POA: Diagnosis not present

## 2018-06-01 DIAGNOSIS — Z23 Encounter for immunization: Secondary | ICD-10-CM | POA: Diagnosis not present

## 2018-06-01 DIAGNOSIS — Z0001 Encounter for general adult medical examination with abnormal findings: Secondary | ICD-10-CM | POA: Diagnosis not present

## 2018-06-13 DIAGNOSIS — C672 Malignant neoplasm of lateral wall of bladder: Secondary | ICD-10-CM | POA: Diagnosis not present

## 2018-07-10 DIAGNOSIS — B029 Zoster without complications: Secondary | ICD-10-CM | POA: Diagnosis not present

## 2018-07-31 DIAGNOSIS — N35010 Post-traumatic urethral stricture, male, meatal: Secondary | ICD-10-CM | POA: Diagnosis not present

## 2018-07-31 DIAGNOSIS — C672 Malignant neoplasm of lateral wall of bladder: Secondary | ICD-10-CM | POA: Diagnosis not present

## 2018-08-11 DIAGNOSIS — Z01812 Encounter for preprocedural laboratory examination: Secondary | ICD-10-CM | POA: Diagnosis not present

## 2018-08-11 DIAGNOSIS — C672 Malignant neoplasm of lateral wall of bladder: Secondary | ICD-10-CM | POA: Diagnosis not present

## 2018-08-11 DIAGNOSIS — Z1159 Encounter for screening for other viral diseases: Secondary | ICD-10-CM | POA: Diagnosis not present

## 2018-08-22 DIAGNOSIS — C673 Malignant neoplasm of anterior wall of bladder: Secondary | ICD-10-CM | POA: Diagnosis not present

## 2018-08-22 DIAGNOSIS — C671 Malignant neoplasm of dome of bladder: Secondary | ICD-10-CM | POA: Diagnosis not present

## 2018-08-22 DIAGNOSIS — Z8551 Personal history of malignant neoplasm of bladder: Secondary | ICD-10-CM | POA: Diagnosis not present

## 2018-08-22 DIAGNOSIS — C679 Malignant neoplasm of bladder, unspecified: Secondary | ICD-10-CM | POA: Diagnosis not present

## 2018-08-22 DIAGNOSIS — C672 Malignant neoplasm of lateral wall of bladder: Secondary | ICD-10-CM | POA: Diagnosis not present

## 2018-08-28 DIAGNOSIS — H01002 Unspecified blepharitis right lower eyelid: Secondary | ICD-10-CM | POA: Diagnosis not present

## 2018-08-28 DIAGNOSIS — H5203 Hypermetropia, bilateral: Secondary | ICD-10-CM | POA: Diagnosis not present

## 2018-08-28 DIAGNOSIS — H02834 Dermatochalasis of left upper eyelid: Secondary | ICD-10-CM | POA: Diagnosis not present

## 2018-08-28 DIAGNOSIS — H02831 Dermatochalasis of right upper eyelid: Secondary | ICD-10-CM | POA: Diagnosis not present

## 2018-08-28 DIAGNOSIS — H01001 Unspecified blepharitis right upper eyelid: Secondary | ICD-10-CM | POA: Diagnosis not present

## 2018-09-12 DIAGNOSIS — C672 Malignant neoplasm of lateral wall of bladder: Secondary | ICD-10-CM | POA: Diagnosis not present

## 2018-09-14 DIAGNOSIS — G894 Chronic pain syndrome: Secondary | ICD-10-CM | POA: Diagnosis not present

## 2018-09-14 DIAGNOSIS — I25810 Atherosclerosis of coronary artery bypass graft(s) without angina pectoris: Secondary | ICD-10-CM | POA: Diagnosis not present

## 2018-09-20 DIAGNOSIS — C672 Malignant neoplasm of lateral wall of bladder: Secondary | ICD-10-CM | POA: Diagnosis not present

## 2018-09-26 DIAGNOSIS — C672 Malignant neoplasm of lateral wall of bladder: Secondary | ICD-10-CM | POA: Diagnosis not present

## 2018-09-26 DIAGNOSIS — R3129 Other microscopic hematuria: Secondary | ICD-10-CM | POA: Diagnosis not present

## 2018-10-04 DIAGNOSIS — C672 Malignant neoplasm of lateral wall of bladder: Secondary | ICD-10-CM | POA: Diagnosis not present

## 2018-10-25 ENCOUNTER — Other Ambulatory Visit: Payer: Self-pay | Admitting: Cardiovascular Disease

## 2018-10-25 MED ORDER — ISOSORBIDE MONONITRATE ER 60 MG PO TB24: 60.0000 mg | ORAL_TABLET | Freq: Every day | ORAL | 0 refills | Status: DC

## 2018-11-16 ENCOUNTER — Encounter: Payer: Self-pay | Admitting: Cardiovascular Disease

## 2018-11-16 ENCOUNTER — Other Ambulatory Visit: Payer: Self-pay

## 2018-11-16 ENCOUNTER — Ambulatory Visit: Payer: Medicare Other | Admitting: Cardiovascular Disease

## 2018-11-16 VITALS — BP 140/76 | HR 59 | Ht 72.0 in | Wt 223.0 lb

## 2018-11-16 DIAGNOSIS — I25119 Atherosclerotic heart disease of native coronary artery with unspecified angina pectoris: Secondary | ICD-10-CM | POA: Diagnosis not present

## 2018-11-16 DIAGNOSIS — I10 Essential (primary) hypertension: Secondary | ICD-10-CM

## 2018-11-16 DIAGNOSIS — E782 Mixed hyperlipidemia: Secondary | ICD-10-CM

## 2018-11-16 NOTE — Progress Notes (Signed)
Cardiology Office Note:    Date:  11/16/2018   ID:  [name], [name] July 29, 1945, MRN [id number]  PCP:  Sharilyn Sites, MD  Cardiologist:  Sherren Mocha, MD  Electrophysiologist:  None   Referring MD: Sharilyn Sites, MD   Chief Complaint  Patient presents with  . Coronary Artery Disease    History of Present Illness:    [name] is a 73 y.o. male with a hx of coronary artery disease, presenting for follow-up evaluation.  The patient underwent two-vessel CABG with a LIMA to LAD and RIMA to RCA in 1997.  Other cardiovascular problems include hypertension and hyperlipidemia.  The patient underwent PCI of the distal right coronary artery through the [place] graft in 2017 when he presented with exertional angina and an abnormal nuclear stress test.  His most recent heart catheterization demonstrated patency of his bypass grafts and stent sites.  Today, he denies symptoms of palpitations, chest pain, shortness of breath, orthopnea, PND, lower extremity edema, dizziness, or syncope. The patient is here alone. He has been active and he eats a healthy diet. When I saw him last year he was having intermittent chest pain. imdur was increased. In the interim he has figured out that it was coming from eating soft serve ice cream. He quit eating ice cream and his chest pain has resolved.   Past Medical History:  Diagnosis Date  . Anginal pain ([place])   . Arthritis    "all over"  . Bladder tumor   . Chronic lower back pain   . Coronary artery disease    a. s/p CABG x 2 (LIMA->LAD, RIMA->RCA);  b. s/p multiple PCI's to Ramus;  c. 08/2011 Cath/PCI: LM 70% into ramus with 70-80% there->treated wtih 3.5x18 Xience Xpedition DES, LCX  nonobs, RCA occluded.  RIMA & LIMA patent, EF 55-65%  . DJD (degenerative joint disease)   . GERD (gastroesophageal reflux disease)   . History of gout   . Hyperlipidemia   . Hypertension   . Hypothyroidism   . Neuropathy   . OSA on CPAP   . Prostate cancer  [facility])     Past Surgical History:  Procedure Laterality Date  . ABDOMINAL HERNIA REPAIR    . BIOPSY  11/26/2015   Procedure: BIOPSY;  Surgeon: Rogene Houston, MD;  [facility];  Service: Endoscopy;;  gastric esophagus  . BLADDER TUMOR EXCISION    . CARDIAC CATHETERIZATION N/A 04/10/2015   Procedure: Left Heart Cath and Cors/Grafts Angiography;  Surgeon: Sherren Mocha, MD;  Location: [place] CV LAB;  Service: Cardiovascular;  Laterality: N/A;  . CARDIAC CATHETERIZATION N/A 04/10/2015   Procedure: Coronary Stent Intervention;  Surgeon: Sherren Mocha, MD;  Location: [place] CV LAB;  Service: Cardiovascular;  Laterality: N/A;  . CATARACT EXTRACTION W/PHACO Right 06/23/2015   Procedure: CATARACT EXTRACTION PHACO AND INTRAOCULAR LENS PLACEMENT RIGHT EYE; CDE:  7.08;  Surgeon: Tonny Branch, MD;  Location: [facility] OR[facility];  Service: Ophthalmology;  Laterality: Right;  . CATARACT EXTRACTION W/PHACO Left 08/25/2015   Procedure: CATARACT EXTRACTION PHACO AND INTRAOCULAR LENS PLACEMENT LEFT EYE; CDE:  8.18;  Surgeon: Tonny Branch, MD;  Location: [facility];  Service: Ophthalmology;  Laterality: Left;  . COLONOSCOPY N/A 12/28/2012   Procedure: COLONOSCOPY;  Surgeon: Rogene Houston, MD;  [facility];  Service: Endoscopy;  Laterality: N/A;  830 rescheduled  . CORONARY ANGIOPLASTY WITH STENT PLACEMENT  2013; 04/10/2015   "this makes me a total of 7" (04/10/2015)  .  CORONARY ARTERY BYPASS GRAFT  1997   with (LIMA)  . ESOPHAGEAL DILATION N/A 11/26/2015   Procedure: ESOPHAGEAL DILATION;  Surgeon: Rogene Houston, MD;  [facility];  Service: Endoscopy;  Laterality: N/A;  . ESOPHAGEAL DILATION N/A 05/23/2017   Procedure: ESOPHAGEAL DILATION;  Surgeon: Rogene Houston, MD;  [facility];  Service: Endoscopy;  Laterality: N/A;  . ESOPHAGEAL DILATION N/A 06/16/2017   Procedure: ESOPHAGEAL DILATION;  Surgeon: Rogene Houston, MD;  [facility];  Service: Endoscopy;   Laterality: N/A;  . ESOPHAGOGASTRODUODENOSCOPY N/A 11/26/2015   Procedure: ESOPHAGOGASTRODUODENOSCOPY (EGD);  Surgeon: Rogene Houston, MD;  [facility];  Service: Endoscopy;  Laterality: N/A;  1:25  . ESOPHAGOGASTRODUODENOSCOPY N/A 05/23/2017   Procedure: ESOPHAGOGASTRODUODENOSCOPY (EGD);  Surgeon: Rogene Houston, MD;  [facility];  Service: Endoscopy;  Laterality: N/A;  1:15  . ESOPHAGOGASTRODUODENOSCOPY N/A 06/16/2017   Procedure: ESOPHAGOGASTRODUODENOSCOPY (EGD);  Surgeon: Rogene Houston, MD;  [facility];  Service: Endoscopy;  Laterality: N/A;  1240  . HERNIA REPAIR Left   . KNEE CARTILAGE SURGERY Bilateral   . LEFT HEART CATH AND CORS/GRAFTS ANGIOGRAPHY N/A 09/08/2016   Procedure: LEFT HEART CATH AND CORS/GRAFTS ANGIOGRAPHY;  Surgeon: Martinique, Peter M, MD;  Location: [place] CV LAB;  Service: Cardiovascular;  Laterality: N/A;  . LEFT HEART CATHETERIZATION WITH CORONARY ANGIOGRAM N/A 09/08/2011   Procedure: LEFT HEART CATHETERIZATION WITH CORONARY ANGIOGRAM;  Surgeon: Sherren Mocha, MD;  Location: [facility] CATH LAB;  Service: Cardiovascular;  Laterality: N/A;  . PERCUTANEOUS CORONARY STENT INTERVENTION (PCI-S) Right 09/08/2011   Procedure: PERCUTANEOUS CORONARY STENT INTERVENTION (PCI-S);  Surgeon: Sherren Mocha, MD;  Location: [facility] CATH LAB;  Service: Cardiovascular;  Laterality: Right;  . ROBOT ASSISTED LAPAROSCOPIC RADICAL PROSTATECTOMY      Current Medications: Current Meds  Medication Sig  . aspirin 81 MG tablet Take 1 tablet (81 mg total) by mouth at bedtime.  Marland Kitchen atorvastatin (LIPITOR) 40 MG tablet TAKE 1 TABLET BY MOUTH EVERY DAY AT 6 PM  . Biotin 5000 MCG CAPS Take 5,000 mcg by mouth daily.   . celecoxib (CELEBREX) 200 MG capsule Take 1 capsule (200 mg total) by mouth daily.  . clopidogrel (PLAVIX) 75 MG tablet Take 0.5 tablets by mouth daily.  . cyanocobalamin (,VITAMIN B-12,) 1000 MCG/ML injection Inject 1,000 mcg into the muscle See admin  instructions. Monthly as needed  . gabapentin (NEURONTIN) 600 MG tablet Take 600 mg by mouth 3 (three) times daily.  Marland Kitchen glucosamine-chondroitin 500-400 MG tablet Take 1 tablet by mouth daily.  . isosorbide mononitrate (IMDUR) 60 MG 24 hr tablet Take 1 tablet (60 mg total) by mouth daily. Please make yearly appt with Dr. Burt Knack for November before anymore refills. 1st attempt  . levothyroxine (SYNTHROID, LEVOTHROID) 150 MCG tablet Take 150 mcg by mouth daily before breakfast.  . Multiple Vitamin (MULTIVITAMIN) tablet Take 1 tablet by mouth every other day.   . nitroGLYCERIN (NITROSTAT) 0.4 MG SL tablet Place 1 tablet (0.4 mg total) under the tongue every 5 (five) minutes as needed for chest pain. Please make appt with Dr. Burt Knack.1st attempt  . Omega-3 Fatty Acids (FISH OIL) 1200 MG CAPS Take 1,200 mg by mouth daily.   Marland Kitchen OVER THE COUNTER MEDICATION Take 2 capsules by mouth 2 (two) times daily. Lactin Probiotic  . Polyvinyl Alcohol-Povidone (REFRESH OP) Place 1 drop into both eyes daily as needed (dry eyes).  . traMADol (ULTRAM) 50 MG tablet Take 50  mg by mouth 3 (three) times daily.      Allergies:   Codeine and Pollen extract   Social History   Socioeconomic History  . Marital status: Widowed    Spouse name: Not on file  . Number of children: Not on file  . Years of education: Not on file  . Highest education level: Not on file  Occupational History  . Occupation: semi-retired  Social Needs  . Financial resource strain: Not on file  . Food insecurity    Worry: Not on file    Inability: Not on file  . Transportation needs    Medical: Not on file    Non-medical: Not on file  Tobacco Use  . Smoking status: Former Smoker    Years: 12.00    Types: Cigars  . Smokeless tobacco: Never Used  . Tobacco comment: "quit smoking in ~ 1978"  Substance and Sexual Activity  . Alcohol use: Yes    Comment: 04/10/2015 "I've drank 4 beers in the last 2 months"  . Drug use: No  . Sexual activity:  Not Currently  Lifestyle  . Physical activity    Days per week: Not on file    Minutes per session: Not on file  . Stress: Not on file  Relationships  . Social Herbalist on phone: Not on file    Gets together: Not on file    Attends religious service: Not on file    Active member of club or organization: Not on file    Attends meetings of clubs or organizations: Not on file    Relationship status: Not on file  Other Topics Concern  . Not on file  Social History Narrative  . Not on file     Family History: The patient's family history includes Cancer (age of onset: 88) in his father; Stroke (age of onset: 38) in his mother.  ROS:   Please see the history of present illness.     All other systems reviewed and are negative.  EKGs/Labs/Other Studies Reviewed:    The following studies were reviewed today: Cardiac cath 09-08-2016: Conclusion    Mid RCA lesion, 100 %stenosed.  RIMA.  Dist RCA lesion, 0 %stenosed.  A drug eluting .  Ost RPDA lesion, 0 %stenosed.  A drug eluting .  Prox LAD lesion, 100 %stenosed.  LIMA.  Ost LM to LM lesion, 25 %stenosed.  Ost Ramus to Ramus lesion, 25 %stenosed.  The left ventricular systolic function is normal.  LV end diastolic pressure is mildly elevated.  The left ventricular ejection fraction is 55-65% by visual estimate.   1. 2 vessel occlusive CAD 2. Patent stents in the left main and large ramus intermediate branch 3. Patent stents in the distal RCA 4. Patent free RIMA to the distal RCA 5. Patent LIMA to the LAD 6. Normal LV function 7. Mildly elevated LVEDP.    EKG:  EKG is ordered today.  The ekg ordered today demonstrates NSR 59 bpm, incomplete RBBB, nonspecific ST abnormality  Recent Labs: No results found for requested labs within last 8760 hours.  Recent Lipid Panel No results found for: CHOL, TRIG, HDL, CHOLHDL, VLDL, LDLCALC, LDLDIRECT  Physical Exam:    VS:  BP 140/76   Pulse (!) 59    Ht 6' (1.829 m)   Wt 223 lb (101.2 kg)   SpO2 96%   BMI 30.24 kg/m     Wt Readings from Last 3 Encounters:  11/16/18 223 lb (  101.2 kg)  03/17/18 225 lb (102.1 kg)  11/29/17 224 lb 3.2 oz (101.7 kg)     GEN:  Well nourished, well developed in no acute distress HEENT: Normal NECK: No JVD; No carotid bruits LYMPHATICS: No lymphadenopathy CARDIAC: RRR, 2/6 SEM at the RUSB RESPIRATORY:  Clear to auscultation without rales, wheezing or rhonchi  ABDOMEN: Soft, non-tender, non-distended MUSCULOSKELETAL:  No edema; No deformity  SKIN: Warm and dry NEUROLOGIC:  Alert and oriented x 3 PSYCHIATRIC:  Normal affect   ASSESSMENT:    1. Coronary artery disease involving native coronary artery of native heart with angina pectoris ([place])   2. Mixed hyperlipidemia   3. Essential hypertension    PLAN:    In order of problems listed above:  1. Doing well at present. Continue imdur at current dose. Most recent cath study reviewed as above.  2. Lipids excellent, reviewed with him today, LDL 45 mg/dL. 3. BP well-controlled. Continue healthy lifestyle.    Medication Adjustments/Labs and Tests Ordered: Current medicines are reviewed at length with the patient today.  Concerns regarding medicines are outlined above.  No orders of the defined types were placed in this encounter.  No orders of the defined types were placed in this encounter.   There are no Patient Instructions on file for this visit.   Signed, Sherren Mocha, MD  11/16/2018 3:01 PM    [place]

## 2018-11-29 DIAGNOSIS — Z23 Encounter for immunization: Secondary | ICD-10-CM | POA: Diagnosis not present

## 2018-11-30 ENCOUNTER — Ambulatory Visit (INDEPENDENT_AMBULATORY_CARE_PROVIDER_SITE_OTHER): Payer: Medicare Other | Admitting: Nurse Practitioner

## 2018-12-17 ENCOUNTER — Other Ambulatory Visit: Payer: Self-pay | Admitting: Cardiovascular Disease

## 2018-12-25 DIAGNOSIS — C672 Malignant neoplasm of lateral wall of bladder: Secondary | ICD-10-CM | POA: Diagnosis not present

## 2019-01-14 ENCOUNTER — Other Ambulatory Visit: Payer: Self-pay | Admitting: Cardiovascular Disease

## 2019-01-21 ENCOUNTER — Other Ambulatory Visit: Payer: Self-pay | Admitting: Cardiovascular Disease

## 2019-02-13 DIAGNOSIS — Z01812 Encounter for preprocedural laboratory examination: Secondary | ICD-10-CM | POA: Diagnosis not present

## 2019-02-19 DIAGNOSIS — G629 Polyneuropathy, unspecified: Secondary | ICD-10-CM | POA: Diagnosis not present

## 2019-02-19 DIAGNOSIS — G894 Chronic pain syndrome: Secondary | ICD-10-CM | POA: Diagnosis not present

## 2019-02-20 DIAGNOSIS — G4733 Obstructive sleep apnea (adult) (pediatric): Secondary | ICD-10-CM | POA: Diagnosis not present

## 2019-02-20 DIAGNOSIS — C672 Malignant neoplasm of lateral wall of bladder: Secondary | ICD-10-CM | POA: Diagnosis not present

## 2019-02-20 DIAGNOSIS — C671 Malignant neoplasm of dome of bladder: Secondary | ICD-10-CM | POA: Diagnosis not present

## 2019-02-20 DIAGNOSIS — Z87891 Personal history of nicotine dependence: Secondary | ICD-10-CM | POA: Diagnosis not present

## 2019-02-20 DIAGNOSIS — D303 Benign neoplasm of bladder: Secondary | ICD-10-CM | POA: Diagnosis not present

## 2019-02-20 DIAGNOSIS — E039 Hypothyroidism, unspecified: Secondary | ICD-10-CM | POA: Diagnosis not present

## 2019-02-20 DIAGNOSIS — I2510 Atherosclerotic heart disease of native coronary artery without angina pectoris: Secondary | ICD-10-CM | POA: Diagnosis not present

## 2019-02-26 DIAGNOSIS — H02831 Dermatochalasis of right upper eyelid: Secondary | ICD-10-CM | POA: Diagnosis not present

## 2019-02-26 DIAGNOSIS — H02834 Dermatochalasis of left upper eyelid: Secondary | ICD-10-CM | POA: Diagnosis not present

## 2019-02-26 DIAGNOSIS — H01002 Unspecified blepharitis right lower eyelid: Secondary | ICD-10-CM | POA: Diagnosis not present

## 2019-02-26 DIAGNOSIS — H01001 Unspecified blepharitis right upper eyelid: Secondary | ICD-10-CM | POA: Diagnosis not present

## 2019-02-26 DIAGNOSIS — H01004 Unspecified blepharitis left upper eyelid: Secondary | ICD-10-CM | POA: Diagnosis not present

## 2019-03-05 ENCOUNTER — Telehealth: Payer: Self-pay | Admitting: *Deleted

## 2019-03-05 NOTE — Telephone Encounter (Signed)
   [facility] Pre-operative Risk Assessment    Request for surgical clearance:  1. What type of surgery is being performed? BLEPHAROPLASTY    2. When is this surgery scheduled? 04/27/19   3. What type of clearance is required (medical clearance vs. Pharmacy clearance to hold med vs. Both)? MEDICAL  4. Are there any medications that need to be held prior to surgery and how long? PLAVIX AND ASA   5. Practice name and name of physician performing surgery? [facility]; DR. Denyce Robert   6. What is your office phone number [phone]    7.   What is your office fax number [phone]  8.   Anesthesia type (None, local, MAC, general) ? LOCAL w/MAC    [name] 03/05/2019, 9:37 AM  _________________________________________________________________   (provider comments below)

## 2019-03-05 NOTE — Telephone Encounter (Signed)
Dr. Burt Knack pt is for Blepharoplasty and needs to be off plavix and ASA, may he hold for 5 days?   he has hx of CAD with CABG and then PCI in 2017 through [place] to PDA and last cath 2018 with patent stents in LM and large ramus intermediate branch and distal RCA.   Please send to pre-op pool  Thanks, Mickel Baas

## 2019-03-06 NOTE — Telephone Encounter (Signed)
OK to hold ASA and clopdigrel 5 days prior to surgery as requested. thanks

## 2019-03-06 NOTE — Telephone Encounter (Signed)
   Primary Cardiologist: Sherren Mocha, MD  Chart reviewed as part of pre-operative protocol coverage. Patient was contacted 03/06/2019 in reference to pre-operative risk assessment for pending surgery as outlined below.  [name] was last seen on 11/16/2018 by Dr. Burt Knack for CAD with hx of CABG and PCI and stable anatomy on last cath 2018.  Since that day, [name] has done well and can ambulate up to 4 METS without angina.    He may hold Asprin and plavix 5 days prior to procedure and resume afterward.    Therefore, based on ACC/AHA guidelines, the patient would be at acceptable risk for the planned procedure without further cardiovascular testing.   I will route this recommendation to the requesting party via Epic fax function and remove from pre-op pool.  Please call with questions.  Cecilie Kicks, NP 03/06/2019, 12:04 PM

## 2019-03-08 DIAGNOSIS — L820 Inflamed seborrheic keratosis: Secondary | ICD-10-CM | POA: Diagnosis not present

## 2019-03-08 DIAGNOSIS — X32XXXD Exposure to sunlight, subsequent encounter: Secondary | ICD-10-CM | POA: Diagnosis not present

## 2019-03-08 DIAGNOSIS — L570 Actinic keratosis: Secondary | ICD-10-CM | POA: Diagnosis not present

## 2019-04-12 DIAGNOSIS — I10 Essential (primary) hypertension: Secondary | ICD-10-CM | POA: Diagnosis not present

## 2019-04-12 DIAGNOSIS — R7309 Other abnormal glucose: Secondary | ICD-10-CM | POA: Diagnosis not present

## 2019-04-12 DIAGNOSIS — Z1389 Encounter for screening for other disorder: Secondary | ICD-10-CM | POA: Diagnosis not present

## 2019-04-12 DIAGNOSIS — M5410 Radiculopathy, site unspecified: Secondary | ICD-10-CM | POA: Diagnosis not present

## 2019-04-17 NOTE — H&P (Signed)
Surgical History & Physical  Patient Name: [name] DOB: 1945/08/05  Surgery: Blepharoplasty; Bilateral upper eyelids  Surgeon: Baruch Goldmann MD Surgery Date:  04/27/2019 Pre-Op Date:  04/17/2019  HPI: A 74 Yr. old male patient -Bleph eval today 1. The patient is returning for a Droopy lids 2 years follow-up of both eyes. Since the last visit, the affected area is worsening. The patient's vision is blurry. The condition's severity is constant and severe. Patient has had excess skin BUL x years, gradually getting worse. Can lift lids and pull skin upward and see much better. "Like lifting a curtain" OU to see better. Patient interested in having surgery for BCVA. Excess skin seems to be the problem per patient. Can pull skin and it does not move. So much extra skin BUL. Patient has a hard time seeing people come up beside him due to "shades of lids" blocking until he turns or raises brows to see better.  Medical History: Cataracts s/p YAG cap OU Arthritis Cancer Heart Problem LDL Seasonal allergies Thyroid Problems  Review of Systems Negative Allergic/Immunologic Negative Cardiovascular Negative Constitutional Negative Ear, Nose, Mouth & Throat Negative Endocrine Negative Eyes Negative Gastrointestinal Negative Genitourinary Negative Hemotologic/Lymphatic Negative Integumentary Negative Musculoskeletal Negative Neurological Negative Psychiatry Negative Respiratory  Social   Former smoker  Medication Plavix, Aspirin, Fish Oil, CoQ10, Isosorbide, Vitamin B-12, Claritin, Levothyroxine Sodium, Potassium,   Sx/Procedures Phaco c IOL OD, Phaco c IOL OS, YAG Capsulotomy OU,  Double Bypass age 35, Bladder surgery (biopsy - negative),   Drug Allergies  Codeine,   History & Physical: Heent:  Dermatochalasis, Both upper eyelids NECK: supple without bruits LUNGS: lungs clear to auscultation CV: regular rate and rhythm Abdomen: soft and non-tender  Impression &  Plan: Assessment: 1.  DERMATOCHALASIS, Right Upper Lid, Left Upper Lid (H02.831, H02.834) 2.  BLEPHARITIS ; , Right Lower Lid, Left Upper Lid, Left Lower Lid, Right Upper Lid (H01.001, H01.002,H01.004,H01.005) 3.  INTRAOCULAR LENS IOL ; Both Eyes (Z96.1)  Plan: 1.  Symptomatic. Visual fields show significant improvement with lids taped. Photos obtained and interpreted. Findings, prognosis and treatment options reviewed. Recommend bilateral upper eyelid blepharoplasty. Risks, benefits, alternatives, and complications discussed. Schedule surgery. Stop Celebrex and Plavix 10 days before surgery. 2.  Blepharitis- Chronic; Not resolving with scrubs alone. Add doxycyline 50mg  PO qday and continue lid scrubs BID. Patient understands this may not cure the problem and this needs to be a maintenance regimen. Patient was also told that is could be a few weeks before there is some change to the symptoms. 3.  S/P YAG OU. Stable.

## 2019-04-24 ENCOUNTER — Encounter (HOSPITAL_COMMUNITY): Payer: Self-pay

## 2019-04-24 ENCOUNTER — Encounter (HOSPITAL_COMMUNITY)
Admission: RE | Admit: 2019-04-24 | Discharge: 2019-04-24 | Disposition: A | Discharge status: Home / Self Care | Payer: Medicare Other | Source: Ambulatory Visit | Attending: Ophthalmology | Admitting: Ophthalmology

## 2019-04-24 ENCOUNTER — Other Ambulatory Visit (HOSPITAL_COMMUNITY)
Admission: RE | Admit: 2019-04-24 | Discharge: 2019-04-24 | Disposition: A | Discharge status: Home / Self Care | Payer: Medicare Other | Source: Ambulatory Visit | Attending: Ophthalmology | Admitting: Ophthalmology

## 2019-04-24 ENCOUNTER — Other Ambulatory Visit: Payer: Self-pay

## 2019-04-24 DIAGNOSIS — Z01812 Encounter for preprocedural laboratory examination: Secondary | ICD-10-CM | POA: Insufficient documentation

## 2019-04-24 DIAGNOSIS — Z20822 Contact with and (suspected) exposure to covid-19: Secondary | ICD-10-CM | POA: Diagnosis not present

## 2019-04-25 ENCOUNTER — Other Ambulatory Visit (HOSPITAL_COMMUNITY): Payer: Medicare Other

## 2019-04-25 LAB — SARS CORONAVIRUS 2 (TAT 6-24 HRS): SARS Coronavirus 2: NEGATIVE

## 2019-04-27 ENCOUNTER — Encounter (HOSPITAL_COMMUNITY)
Admission: RE | Disposition: A | Discharge status: Home / Self Care | Payer: Self-pay | Source: Home / Self Care | Attending: Ophthalmology

## 2019-04-27 ENCOUNTER — Ambulatory Visit (HOSPITAL_COMMUNITY): Payer: Medicare Other | Admitting: Anesthesiology

## 2019-04-27 ENCOUNTER — Encounter (HOSPITAL_COMMUNITY): Payer: Self-pay | Admitting: Ophthalmology

## 2019-04-27 ENCOUNTER — Ambulatory Visit (HOSPITAL_COMMUNITY)
Admission: RE | Admit: 2019-04-27 | Discharge: 2019-04-27 | Disposition: A | Discharge status: Home / Self Care | Payer: Medicare Other | Attending: Ophthalmology | Admitting: Ophthalmology

## 2019-04-27 DIAGNOSIS — Z87891 Personal history of nicotine dependence: Secondary | ICD-10-CM | POA: Diagnosis not present

## 2019-04-27 DIAGNOSIS — H269 Unspecified cataract: Secondary | ICD-10-CM | POA: Insufficient documentation

## 2019-04-27 DIAGNOSIS — Z885 Allergy status to narcotic agent status: Secondary | ICD-10-CM | POA: Diagnosis not present

## 2019-04-27 DIAGNOSIS — E079 Disorder of thyroid, unspecified: Secondary | ICD-10-CM | POA: Insufficient documentation

## 2019-04-27 DIAGNOSIS — H01002 Unspecified blepharitis right lower eyelid: Secondary | ICD-10-CM | POA: Insufficient documentation

## 2019-04-27 DIAGNOSIS — Z951 Presence of aortocoronary bypass graft: Secondary | ICD-10-CM | POA: Diagnosis not present

## 2019-04-27 DIAGNOSIS — H02831 Dermatochalasis of right upper eyelid: Secondary | ICD-10-CM | POA: Insufficient documentation

## 2019-04-27 DIAGNOSIS — H01005 Unspecified blepharitis left lower eyelid: Secondary | ICD-10-CM | POA: Insufficient documentation

## 2019-04-27 DIAGNOSIS — H02834 Dermatochalasis of left upper eyelid: Secondary | ICD-10-CM | POA: Diagnosis not present

## 2019-04-27 DIAGNOSIS — H01004 Unspecified blepharitis left upper eyelid: Secondary | ICD-10-CM | POA: Insufficient documentation

## 2019-04-27 DIAGNOSIS — M1990 Unspecified osteoarthritis, unspecified site: Secondary | ICD-10-CM | POA: Insufficient documentation

## 2019-04-27 DIAGNOSIS — H01001 Unspecified blepharitis right upper eyelid: Secondary | ICD-10-CM | POA: Diagnosis not present

## 2019-04-27 DIAGNOSIS — I2510 Atherosclerotic heart disease of native coronary artery without angina pectoris: Secondary | ICD-10-CM | POA: Diagnosis not present

## 2019-04-27 DIAGNOSIS — I10 Essential (primary) hypertension: Secondary | ICD-10-CM | POA: Diagnosis not present

## 2019-04-27 HISTORY — PX: BROW LIFT: SHX178

## 2019-04-27 SURGERY — BLEPHAROPLASTY
Anesthesia: Monitor Anesthesia Care | Site: Eye | Laterality: Bilateral

## 2019-04-27 MED ORDER — LIDOCAINE-EPINEPHRINE (PF) 2 %-1:200000 IJ SOLN
10.0000 mL | Freq: Once | INTRAMUSCULAR | Status: DC
  Filled 2019-04-27: qty 10

## 2019-04-27 MED ORDER — MIDAZOLAM HCL 2 MG/2ML IJ SOLN
INTRAMUSCULAR | Status: AC
  Filled 2019-04-27: qty 2

## 2019-04-27 MED ORDER — ONDANSETRON HCL 4 MG/2ML IJ SOLN
INTRAMUSCULAR | Status: DC | PRN
  Administered 2019-04-27: 4 mg via INTRAVENOUS

## 2019-04-27 MED ORDER — FENTANYL CITRATE (PF) 100 MCG/2ML IJ SOLN
INTRAMUSCULAR | Status: DC | PRN
  Administered 2019-04-27 (×2): 25 ug via INTRAVENOUS

## 2019-04-27 MED ORDER — ERYTHROMYCIN 5 MG/GM OP OINT
TOPICAL_OINTMENT | OPHTHALMIC | Status: DC | PRN
  Administered 2019-04-27: 1 via OPHTHALMIC

## 2019-04-27 MED ORDER — LACTATED RINGERS IV SOLN: INTRAVENOUS | Status: DC | PRN

## 2019-04-27 MED ORDER — LIDOCAINE 2% (20 MG/ML) 5 ML SYRINGE
INTRAMUSCULAR | Status: AC
  Filled 2019-04-27: qty 5

## 2019-04-27 MED ORDER — ERYTHROMYCIN 5 MG/GM OP OINT
TOPICAL_OINTMENT | Freq: Once | OPHTHALMIC | Status: DC
  Filled 2019-04-27: qty 3.5

## 2019-04-27 MED ORDER — TETRACAINE HCL 0.5 % OP SOLN
OPHTHALMIC | Status: DC | PRN
  Administered 2019-04-27: 3 [drp] via OPHTHALMIC

## 2019-04-27 MED ORDER — LACTATED RINGERS IV SOLN: Freq: Once | INTRAVENOUS | Status: AC

## 2019-04-27 MED ORDER — 0.9 % SODIUM CHLORIDE (POUR BTL) OPTIME
TOPICAL | Status: DC | PRN
  Administered 2019-04-27: 500 mL

## 2019-04-27 MED ORDER — FENTANYL CITRATE (PF) 100 MCG/2ML IJ SOLN
INTRAMUSCULAR | Status: AC
  Filled 2019-04-27: qty 2

## 2019-04-27 MED ORDER — PROPOFOL 10 MG/ML IV BOLUS
INTRAVENOUS | Status: DC | PRN
  Administered 2019-04-27 (×4): 10 mg via INTRAVENOUS

## 2019-04-27 MED ORDER — ONDANSETRON HCL 4 MG/2ML IJ SOLN
INTRAMUSCULAR | Status: AC
  Filled 2019-04-27: qty 2

## 2019-04-27 MED ORDER — LIDOCAINE-EPINEPHRINE (PF) 2 %-1:200000 IJ SOLN
INTRAMUSCULAR | Status: DC | PRN
  Administered 2019-04-27: 3 mL

## 2019-04-27 MED ORDER — LIDOCAINE HCL (CARDIAC) PF 100 MG/5ML IV SOSY
PREFILLED_SYRINGE | INTRAVENOUS | Status: DC | PRN
  Administered 2019-04-27: 60 mg via INTRAVENOUS

## 2019-04-27 MED ORDER — MIDAZOLAM HCL 2 MG/2ML IJ SOLN
INTRAMUSCULAR | Status: DC | PRN
  Administered 2019-04-27: 1 mg via INTRAVENOUS
  Administered 2019-04-27 (×2): .5 mg via INTRAVENOUS

## 2019-04-27 SURGICAL SUPPLY — 23 items
APPLICATOR COTTON TIP 6 STRL (MISCELLANEOUS) ×5 IMPLANT
APPLICATOR COTTON TIP 6IN STRL (MISCELLANEOUS) ×15
BLADE SURG 15 STRL LF DISP TIS (BLADE) ×1 IMPLANT
BLADE SURG 15 STRL SS (BLADE) ×3
CAUTERY SURG HI TEMP FINE TP (MISCELLANEOUS) ×3 IMPLANT
CLOTH BEACON ORANGE TIMEOUT ST (SAFETY) ×3 IMPLANT
COVER LIGHT HANDLE STERIS (MISCELLANEOUS) ×6 IMPLANT
COVER WAND RF STERILE (DRAPES) ×3 IMPLANT
DECANTER SPIKE VIAL GLASS SM (MISCELLANEOUS) ×3 IMPLANT
GAUZE SPONGE 4X4 12PLY STRL (GAUZE/BANDAGES/DRESSINGS) ×3 IMPLANT
GLOVE BIOGEL PI IND STRL 7.0 (GLOVE) ×2 IMPLANT
GLOVE BIOGEL PI INDICATOR 7.0 (GLOVE) ×4
GLOVE SURG SS PI 7.5 STRL IVOR (GLOVE) ×3 IMPLANT
GOWN STRL REUS W/TWL LRG LVL3 (GOWN DISPOSABLE) ×3 IMPLANT
GOWN STRL REUS W/TWL XL LVL3 (GOWN DISPOSABLE) ×3 IMPLANT
KIT BLADEGUARD II DBL (SET/KITS/TRAYS/PACK) ×3 IMPLANT
MARKER SKIN DUAL TIP RULER LAB (MISCELLANEOUS) ×3 IMPLANT
NEEDLE HYPO 27GX1-1/4 (NEEDLE) ×3 IMPLANT
PACK BASIC III (CUSTOM PROCEDURE TRAY) ×3
PACK SRG BSC III STRL LF ECLPS (CUSTOM PROCEDURE TRAY) ×1 IMPLANT
SET BASIN LINEN APH (SET/KITS/TRAYS/PACK) ×3 IMPLANT
SUT 6 0 PLAIN (SUTURE) ×3 IMPLANT
SYR CONTROL 10ML LL (SYRINGE) ×3 IMPLANT

## 2019-04-27 NOTE — Anesthesia Postprocedure Evaluation (Signed)
Anesthesia Post Note  Patient: [name] [facility]  Procedure(s) Performed: BILATERAL BLEPHAROPLASTY (Bilateral Eye)  Patient location during evaluation: Phase II Anesthesia Type: MAC Level of consciousness: awake and alert Pain management: pain level controlled Vital Signs Assessment: post-procedure vital signs reviewed and stable Respiratory status: spontaneous breathing Cardiovascular status: stable Postop Assessment: no apparent nausea or vomiting Anesthetic complications: no     Last Vitals:  Vitals:   04/27/19 0944  BP: (!) 174/77  Pulse: 61  Resp: 18  Temp: 36.8 C  SpO2: 96%    Last Pain:  Vitals:   04/27/19 0944  TempSrc: Oral  PainSc: 0-No pain                 Everette Rank

## 2019-04-27 NOTE — Interval H&P Note (Signed)
History and Physical Interval Note: The H and P was reviewed and updated. The patient was examined.  No changes were found after exam.  The surgical eye was marked.  04/27/2019 10:19 AM  [name]  has presented today for surgery, with the diagnosis of Dermatochalasis - Bilateral.  The various methods of treatment have been discussed with the patient and family. After consideration of risks, benefits and other options for treatment, the patient has consented to  Procedure(s): BLEPHAROPLASTY (Bilateral) as a surgical intervention.  The patient's history has been reviewed, patient examined, no change in status, stable for surgery.  I have reviewed the patient's chart and labs.  Questions were answered to the patient's satisfaction.     Baruch Goldmann

## 2019-04-27 NOTE — Anesthesia Preprocedure Evaluation (Signed)
Anesthesia Evaluation  Patient identified by MRN, date of birth, ID band Patient awake    Reviewed: Allergy & Precautions, NPO status , Patient's Chart, lab work & pertinent test results  History of Anesthesia Complications Negative for: history of anesthetic complications  Airway Mallampati: III  TM Distance: >3 FB Neck ROM: Full    Dental  (+) Implants, Dental Advisory Given   Pulmonary sleep apnea and Continuous Positive Airway Pressure Ventilation , Patient abstained from smoking., former smoker,    breath sounds clear to auscultation       Cardiovascular Exercise Tolerance: Good hypertension, Pt. on medications and Pt. on home beta blockers + angina (only when he eats icecream) + CAD, + Cardiac Stents and + CABG (1997)  + Valvular Problems/Murmurs  Rhythm:Regular Rate:Normal + Systolic murmurs    Neuro/Psych negative psych ROS   GI/Hepatic Neg liver ROS, GERD  Controlled,  Endo/Other  Hypothyroidism   Renal/GU negative Renal ROS Bladder dysfunction (bladder cancer)      Musculoskeletal  (+) Arthritis , Osteoarthritis,  Chronic back pain   Abdominal   Peds  Hematology   Anesthesia Other Findings   Reproductive/Obstetrics                             Anesthesia Physical Anesthesia Plan  ASA: III  Anesthesia Plan: MAC   Post-op Pain Management:    Induction: Intravenous  PONV Risk Score and Plan: Treatment may vary due to age or medical condition  Airway Management Planned: Nasal Cannula and Natural Airway  Additional Equipment:   Intra-op Plan:   Post-operative Plan:   Informed Consent:     Dental advisory given  Plan Discussed with: CRNA  Anesthesia Plan Comments:         Anesthesia Quick Evaluation

## 2019-04-27 NOTE — Transfer of Care (Signed)
Immediate Anesthesia Transfer of Care Note  Patient: [name] [facility]  Procedure(s) Performed: BILATERAL BLEPHAROPLASTY (Bilateral Eye)  Patient Location: PACU  Anesthesia Type:MAC  Level of Consciousness: awake, alert  and oriented  Airway & Oxygen Therapy: Patient Spontanous Breathing  Post-op Assessment: Report given to RN and Post -op Vital signs reviewed and stable  Post vital signs: Reviewed and stable  Last Vitals:  Vitals Value Taken Time  BP    Temp    Pulse    Resp    SpO2      Last Pain:  Vitals:   04/27/19 0944  TempSrc: Oral  PainSc: 0-No pain         Complications: No apparent anesthesia complications

## 2019-04-27 NOTE — Discharge Instructions (Addendum)
Please discharge patient when stable, will follow up today with Dr. Marisa Hua at the [facility] office in 2 weeks.  All paperwork with discharge instructions was given previously.  He will use ice 20 minutes on, 20 minutes off while awake for the next 48 hours.  He will use erythromycin ointment 4x/day to suture line for 1 week.   Blepharoplasty, Care After This sheet gives you information about how to care for yourself after your procedure. Your health care provider may also give you more specific instructions. If you have problems or questions, contact your health care provider. What can I expect after the procedure? After the procedure, it is common to have:  Swelling.  Bruising.  Soreness.  Sticky, dry, and itchy eyes. Follow these instructions at home: Medicines   Take over-the-counter and prescription medicines only as told by your health care provider.  If you were prescribed an antibiotic medicine, take or apply it as told by your health care provider. Do not stop using the antibiotic even if you start to feel better.  Use eye drops or ointment as told by your health care provider. Incision care  Do not soak or wash your face until your health care provider says that you can. Follow instructions from your health care provider about bathing.  Follow instructions from your health care provider about how to take care of your incision. ? Make sure you leave stitches (sutures), skin glue, or adhesive strips in place. These skin closures may need to stay in place for 2 weeks or longer. ? If adhesive strip edges start to loosen and curl up, you may trim the loose edges. Do not remove adhesive strips completely unless your health care provider tells you to do that.  Check your incision area every day for signs of infection. Check for: ? More redness, swelling, or pain. ? Fluid or blood. ? Warmth. ? Pus or a bad smell.  If directed, put ice on the eye area to  help reduce swelling and soreness: ? Put ice in a plastic bag. ? Place a towel between your skin and the bag. ? Leave the ice on for 20 minutes, 2-3 times a day. Activity  Use a few pillows to keep your head raised (elevated) while you are sleeping or resting.  Do not bend over. Bending over causes your head to be lower than your heart and may increase swelling and bruising around your eyes.  Until your health care provider approves: ? Do not do any activities that take a lot of effort. ? Do not lift anything that is heavier than 10 lb (4.5 kg). General instructions  Do not use any products that contain nicotine or tobacco, such as cigarettes and e-cigarettes. If you need help quitting, ask your health care provider.  To protect your eyes from the sun, wear dark sunglasses and a wide-brimmed hat. Do this until healing is complete. This helps to keep the suture areas from becoming discolored.  Keep all follow-up visits as told by your health care provider. This is important. Contact a health care provider if:  You have a fever.  You have dryness, pain, swelling, or bruising that is not getting better.  You have more redness, swelling, or pain around your incision.  You have fluid or blood coming from your incision.  Your incision feels warm to the touch.  You have pus or a bad smell coming from your incision.  You cannot close your eyes completely. Get help  right away if:  Your eyeball is bulging or your eyeball position is different from normal.  You have double vision or blurry vision that is not getting better.  You have any change in your vision.  You have severe eye pain. Summary  After this procedure, it is common to have swelling and bruising around the eyes.  Follow instructions from your health care provider about how to take care of yourself at home.  Get help right away if you have double vision, or if your eyeball is bulging or its position is different  from normal. These are emergency symptoms. This information is not intended to replace advice given to you by your health care provider. Make sure you discuss any questions you have with your health care provider. Document Revised: 12/10/2016 Document Reviewed: 06/22/2016 Elsevier Patient Education  2020 [place] Anesthesia is a term that refers to techniques, procedures, and medicines that help a person stay safe and comfortable during a medical procedure. Monitored anesthesia care, or sedation, is one type of anesthesia. Your anesthesia specialist may recommend sedation if you will be having a procedure that does not require you to be unconscious, such as:  Cataract surgery.  A dental procedure.  A biopsy.  A colonoscopy. During the procedure, you may receive a medicine to help you relax (sedative). There are three levels of sedation:  Mild sedation. At this level, you may feel awake and relaxed. You will be able to follow directions.  Moderate sedation. At this level, you will be sleepy. You may not remember the procedure.  Deep sedation. At this level, you will be asleep. You will not remember the procedure. The more medicine you are given, the deeper your level of sedation will be. Depending on how you respond to the procedure, the anesthesia specialist may change your level of sedation or the type of anesthesia to fit your needs. An anesthesia specialist will monitor you closely during the procedure. Let your health care provider know about:  Any allergies you have.  All medicines you are taking, including vitamins, herbs, eye drops, creams, and over-the-counter medicines.  Any use of steroids (by mouth or as a cream).  Any problems you or family members have had with sedatives and anesthetic medicines.  Any blood disorders you have.  Any surgeries you have had.  Any medical conditions you have, such as sleep apnea.  Whether you are  pregnant or may be pregnant.  Any use of cigarettes, alcohol, or street drugs. What are the risks? Generally, this is a safe procedure. However, problems may occur, including:  Getting too much medicine (oversedation).  Nausea.  Allergic reaction to medicines.  Trouble breathing. If this happens, a breathing tube may be used to help with breathing. It will be removed when you are awake and breathing on your own.  Heart trouble.  Lung trouble. Before the procedure Staying hydrated Follow instructions from your health care provider about hydration, which may include:  Up to 2 hours before the procedure - you may continue to drink clear liquids, such as water, clear fruit juice, black coffee, and plain tea. Eating and drinking restrictions Follow instructions from your health care provider about eating and drinking, which may include:  8 hours before the procedure - stop eating heavy meals or foods such as meat, fried foods, or fatty foods.  6 hours before the procedure - stop eating light meals or foods, such as toast or cereal.  6 hours  before the procedure - stop drinking milk or drinks that contain milk.  2 hours before the procedure - stop drinking clear liquids. Medicines Ask your health care provider about:  Changing or stopping your regular medicines. This is especially important if you are taking diabetes medicines or blood thinners.  Taking medicines such as aspirin and ibuprofen. These medicines can thin your blood. Do not take these medicines before your procedure if your health care provider instructs you not to. Tests and exams  You will have a physical exam.  You may have blood tests done to show: ? How well your kidneys and liver are working. ? How well your blood can clot. General instructions  Plan to have someone take you home from the hospital or clinic.  If you will be going home right after the procedure, plan to have someone with you for 24  hours.  What happens during the procedure?  Your blood pressure, heart rate, breathing, level of pain and overall condition will be monitored.  An IV tube will be inserted into one of your veins.  Your anesthesia specialist will give you medicines as needed to keep you comfortable during the procedure. This may mean changing the level of sedation.  The procedure will be performed. After the procedure  Your blood pressure, heart rate, breathing rate, and blood oxygen level will be monitored until the medicines you were given have worn off.  Do not drive for 24 hours if you received a sedative.  You may: ? Feel sleepy, clumsy, or nauseous. ? Feel forgetful about what happened after the procedure. ? Have a sore throat if you had a breathing tube during the procedure. ? Vomit. This information is not intended to replace advice given to you by your health care provider. Make sure you discuss any questions you have with your health care provider. Document Revised: 12/10/2016 Document Reviewed: 04/20/2015 Elsevier Patient Education  [place].

## 2019-04-27 NOTE — Op Note (Signed)
Procedure Date: 04/27/19  Preoperative Diagnosis: Visually significant dermatochalasis, both eyes   Postoperative Diagnosis:  Visually significant dermatochalasis, both eyes   Procedure Performed:  Bilateral upper lid blepharoplasty    Surgeon: Baruch Goldmann, M.D.  Assistants: None  Anesthesia:  1. Local infiltrative 2% lidocaine with epinephrine 1:200,000 2. MAC  Specimens: None   Estimated Blood Loss: Less than 1 mL   Complications: None    Indications for Surgery:   Because of the visually significant dermatochalasis of both eyes, it was recommended to do bilateral upper lid blepharoplasty.   The common risks and benefits of surgery were discussed, including inability to close the eyes, over-correction, under-correction, asymmetry, corneal exposure, double vision, blindness and undesired cosmetic result.   Procedure: The patient was brought back to the operating room and was placed in the supine position. The patient was prepped and draped in the usual sterile fashion. Calipers were used to measure ?? mm above the upper lash line in both eyes. A forceps pinch technique was used to carefully measure the amount of skin to be taken off both eyes and care was taken that no lagophthalmos would likely result. From that point a surgical marking pen was used to mark the appropriate amount of skin to be taken from both eyes. Both surgical sites were then injected subcutaneously with local anesthetic (2% lidocaine with epinephrine 1:100,000). After adequate anesthesia was found to be achieved, attention was turned to the left side where a 15 blade was used to incise the skin along the premarked lines. After this was achieved, the cutaneous layer was removed using Westcott scissors and forceps.  Hemostasis was achieved with electocautery.  Three interrupted fast absorbing 6-0 gut sutures were placed. A running 6-0 gut suture was used to close the skin. After this was achieved, attention was  turned to the right eye where a similar procedure was performed, including using a 15 blade to incise the skin, using Westcott scissors and forceps to remove the skin, cautery, and using 6-0 gut sutures, first interrupted and then running, to finish the wound.  After the wound was closed and adequate hemostasis was found to be achieved, the drape was removed and patient was cleaned with wet and dry 4x4s, and ?? ointment was applied to both suture lines. Ice packs were placed over both eyes.   Post-Op Plan/Instructions: A prescription for erythromycin ointment was given. An instruction sheet was given. Ice packs are to be used for 48 hours 20 minutes on and 20 minutes off while awake for 2 days. Antibiotic ointment used for 10 days. A follow up visit will in two weeks in clinic. Patient instructed to call if signs of bleeding, infection, significant pain, or any other concerning symptoms.  Disposition: Home under self care.

## 2019-06-29 DIAGNOSIS — M47816 Spondylosis without myelopathy or radiculopathy, lumbar region: Secondary | ICD-10-CM | POA: Diagnosis not present

## 2019-06-29 DIAGNOSIS — E7849 Other hyperlipidemia: Secondary | ICD-10-CM | POA: Diagnosis not present

## 2019-06-29 DIAGNOSIS — Z1389 Encounter for screening for other disorder: Secondary | ICD-10-CM | POA: Diagnosis not present

## 2019-06-29 DIAGNOSIS — Z0001 Encounter for general adult medical examination with abnormal findings: Secondary | ICD-10-CM | POA: Diagnosis not present

## 2019-06-29 DIAGNOSIS — I10 Essential (primary) hypertension: Secondary | ICD-10-CM | POA: Diagnosis not present

## 2019-06-29 DIAGNOSIS — K219 Gastro-esophageal reflux disease without esophagitis: Secondary | ICD-10-CM | POA: Diagnosis not present

## 2019-08-27 DIAGNOSIS — N35010 Post-traumatic urethral stricture, male, meatal: Secondary | ICD-10-CM | POA: Diagnosis not present

## 2019-08-27 DIAGNOSIS — C672 Malignant neoplasm of lateral wall of bladder: Secondary | ICD-10-CM | POA: Diagnosis not present

## 2019-08-30 DIAGNOSIS — E7849 Other hyperlipidemia: Secondary | ICD-10-CM | POA: Diagnosis not present

## 2019-08-30 DIAGNOSIS — Z0001 Encounter for general adult medical examination with abnormal findings: Secondary | ICD-10-CM | POA: Diagnosis not present

## 2019-08-30 DIAGNOSIS — E039 Hypothyroidism, unspecified: Secondary | ICD-10-CM | POA: Diagnosis not present

## 2019-08-30 DIAGNOSIS — Z1389 Encounter for screening for other disorder: Secondary | ICD-10-CM | POA: Diagnosis not present

## 2019-09-18 DIAGNOSIS — Z951 Presence of aortocoronary bypass graft: Secondary | ICD-10-CM | POA: Diagnosis not present

## 2019-09-18 DIAGNOSIS — E785 Hyperlipidemia, unspecified: Secondary | ICD-10-CM | POA: Diagnosis not present

## 2019-09-18 DIAGNOSIS — Z955 Presence of coronary angioplasty implant and graft: Secondary | ICD-10-CM | POA: Diagnosis not present

## 2019-09-18 DIAGNOSIS — R011 Cardiac murmur, unspecified: Secondary | ICD-10-CM | POA: Diagnosis not present

## 2019-09-18 DIAGNOSIS — Z9989 Dependence on other enabling machines and devices: Secondary | ICD-10-CM | POA: Diagnosis not present

## 2019-09-18 DIAGNOSIS — I2510 Atherosclerotic heart disease of native coronary artery without angina pectoris: Secondary | ICD-10-CM | POA: Diagnosis not present

## 2019-09-18 DIAGNOSIS — Z7982 Long term (current) use of aspirin: Secondary | ICD-10-CM | POA: Diagnosis not present

## 2019-09-18 DIAGNOSIS — I10 Essential (primary) hypertension: Secondary | ICD-10-CM | POA: Diagnosis not present

## 2019-09-18 DIAGNOSIS — E039 Hypothyroidism, unspecified: Secondary | ICD-10-CM | POA: Diagnosis not present

## 2019-09-18 DIAGNOSIS — Z7902 Long term (current) use of antithrombotics/antiplatelets: Secondary | ICD-10-CM | POA: Diagnosis not present

## 2019-09-18 DIAGNOSIS — Z79899 Other long term (current) drug therapy: Secondary | ICD-10-CM | POA: Diagnosis not present

## 2019-09-18 DIAGNOSIS — G4733 Obstructive sleep apnea (adult) (pediatric): Secondary | ICD-10-CM | POA: Diagnosis not present

## 2019-09-18 DIAGNOSIS — C672 Malignant neoplasm of lateral wall of bladder: Secondary | ICD-10-CM | POA: Diagnosis not present

## 2019-09-18 DIAGNOSIS — Z87891 Personal history of nicotine dependence: Secondary | ICD-10-CM | POA: Diagnosis not present

## 2019-09-25 DIAGNOSIS — R011 Cardiac murmur, unspecified: Secondary | ICD-10-CM | POA: Diagnosis not present

## 2019-09-25 DIAGNOSIS — E785 Hyperlipidemia, unspecified: Secondary | ICD-10-CM | POA: Diagnosis not present

## 2019-09-25 DIAGNOSIS — E039 Hypothyroidism, unspecified: Secondary | ICD-10-CM | POA: Diagnosis not present

## 2019-09-25 DIAGNOSIS — Z87891 Personal history of nicotine dependence: Secondary | ICD-10-CM | POA: Diagnosis not present

## 2019-09-25 DIAGNOSIS — C672 Malignant neoplasm of lateral wall of bladder: Secondary | ICD-10-CM | POA: Diagnosis not present

## 2019-09-25 DIAGNOSIS — Z79899 Other long term (current) drug therapy: Secondary | ICD-10-CM | POA: Diagnosis not present

## 2019-09-25 DIAGNOSIS — G4733 Obstructive sleep apnea (adult) (pediatric): Secondary | ICD-10-CM | POA: Diagnosis not present

## 2019-09-25 DIAGNOSIS — Z7982 Long term (current) use of aspirin: Secondary | ICD-10-CM | POA: Diagnosis not present

## 2019-09-25 DIAGNOSIS — Z9989 Dependence on other enabling machines and devices: Secondary | ICD-10-CM | POA: Diagnosis not present

## 2019-09-25 DIAGNOSIS — Z955 Presence of coronary angioplasty implant and graft: Secondary | ICD-10-CM | POA: Diagnosis not present

## 2019-09-25 DIAGNOSIS — C679 Malignant neoplasm of bladder, unspecified: Secondary | ICD-10-CM | POA: Diagnosis not present

## 2019-09-25 DIAGNOSIS — I10 Essential (primary) hypertension: Secondary | ICD-10-CM | POA: Diagnosis not present

## 2019-09-25 DIAGNOSIS — Z951 Presence of aortocoronary bypass graft: Secondary | ICD-10-CM | POA: Diagnosis not present

## 2019-09-25 DIAGNOSIS — Z7902 Long term (current) use of antithrombotics/antiplatelets: Secondary | ICD-10-CM | POA: Diagnosis not present

## 2019-09-25 DIAGNOSIS — I2510 Atherosclerotic heart disease of native coronary artery without angina pectoris: Secondary | ICD-10-CM | POA: Diagnosis not present

## 2019-09-28 ENCOUNTER — Emergency Department (HOSPITAL_COMMUNITY)
Admission: EM | Admit: 2019-09-28 | Discharge: 2019-09-28 | Discharge status: Against Medical Advice | Payer: Medicare Other

## 2019-09-28 ENCOUNTER — Other Ambulatory Visit: Payer: Self-pay | Admitting: Urology

## 2019-09-28 ENCOUNTER — Other Ambulatory Visit: Payer: Self-pay

## 2019-10-01 ENCOUNTER — Other Ambulatory Visit: Payer: Self-pay | Admitting: Urology

## 2019-10-01 DIAGNOSIS — C672 Malignant neoplasm of lateral wall of bladder: Secondary | ICD-10-CM | POA: Diagnosis not present

## 2019-10-01 DIAGNOSIS — C675 Malignant neoplasm of bladder neck: Secondary | ICD-10-CM

## 2019-10-15 DIAGNOSIS — M5030 Other cervical disc degeneration, unspecified cervical region: Secondary | ICD-10-CM | POA: Diagnosis not present

## 2019-10-15 DIAGNOSIS — Z23 Encounter for immunization: Secondary | ICD-10-CM | POA: Diagnosis not present

## 2019-10-15 DIAGNOSIS — R7309 Other abnormal glucose: Secondary | ICD-10-CM | POA: Diagnosis not present

## 2019-10-15 DIAGNOSIS — I10 Essential (primary) hypertension: Secondary | ICD-10-CM | POA: Diagnosis not present

## 2019-10-17 DIAGNOSIS — N289 Disorder of kidney and ureter, unspecified: Secondary | ICD-10-CM | POA: Diagnosis not present

## 2019-10-17 DIAGNOSIS — C675 Malignant neoplasm of bladder neck: Secondary | ICD-10-CM | POA: Diagnosis not present

## 2019-10-24 DIAGNOSIS — C672 Malignant neoplasm of lateral wall of bladder: Secondary | ICD-10-CM | POA: Diagnosis not present

## 2019-10-25 ENCOUNTER — Other Ambulatory Visit: Payer: Self-pay | Admitting: Cardiovascular Disease

## 2019-10-31 DIAGNOSIS — C672 Malignant neoplasm of lateral wall of bladder: Secondary | ICD-10-CM | POA: Diagnosis not present

## 2019-11-02 ENCOUNTER — Ambulatory Visit
Admission: RE | Admit: 2019-11-02 | Discharge: 2019-11-02 | Disposition: A | Discharge status: Home / Self Care | Payer: Medicare Other | Source: Ambulatory Visit | Attending: Urology | Admitting: Urology

## 2019-11-02 ENCOUNTER — Other Ambulatory Visit: Payer: Self-pay

## 2019-11-02 DIAGNOSIS — C679 Malignant neoplasm of bladder, unspecified: Secondary | ICD-10-CM | POA: Diagnosis not present

## 2019-11-02 DIAGNOSIS — C675 Malignant neoplasm of bladder neck: Secondary | ICD-10-CM

## 2019-11-02 MED ORDER — IOPAMIDOL (ISOVUE-300) INJECTION 61%
100.0000 mL | Freq: Once | INTRAVENOUS | Status: AC | PRN
  Administered 2019-11-02: 100 mL via INTRAVENOUS

## 2019-11-06 ENCOUNTER — Ambulatory Visit (HOSPITAL_COMMUNITY): Payer: Medicare Other | Attending: Physician Assistant | Admitting: Physical Therapy

## 2019-11-06 ENCOUNTER — Other Ambulatory Visit: Payer: Self-pay

## 2019-11-06 ENCOUNTER — Encounter (HOSPITAL_COMMUNITY): Payer: Self-pay | Admitting: Physical Therapy

## 2019-11-06 DIAGNOSIS — M542 Cervicalgia: Secondary | ICD-10-CM | POA: Diagnosis not present

## 2019-11-06 DIAGNOSIS — M436 Torticollis: Secondary | ICD-10-CM | POA: Insufficient documentation

## 2019-11-06 NOTE — Therapy (Signed)
[facility] [place], [place], [address] Phone: [phone]   Fax:  [phone]  Physical Therapy Evaluation  Patient Details  Name: [name] MRN: [id number] Date of Birth: May 04, 1945 Referring Provider (PT): Collene Mares   Encounter Date: 11/06/2019   PT End of Session - 11/06/19 1041    Visit Number 1    Number of Visits 8    Date for PT Re-Evaluation 12/06/19    Authorization Type [employer] medicare    Progress Note Due on Visit 8    PT Start Time 1000    PT Stop Time 1040    PT Time Calculation (min) 40 min    Activity Tolerance Patient tolerated treatment well           Past Medical History:  Diagnosis Date  . Anginal pain ([place])   . Arthritis    "all over"  . Bladder tumor   . Chronic lower back pain   . Coronary artery disease    a. s/p CABG x 2 (LIMA->LAD, RIMA->RCA);  b. s/p multiple PCI's to Ramus;  c. 08/2011 Cath/PCI: LM 70% into ramus with 70-80% there->treated wtih 3.5x18 Xience Xpedition DES, LCX  nonobs, RCA occluded.  RIMA & LIMA patent, EF 55-65%  . DJD (degenerative joint disease)   . GERD (gastroesophageal reflux disease)   . History of gout   . Hyperlipidemia   . Hypertension   . Hypothyroidism   . Neuropathy   . OSA on CPAP   . Prostate cancer [facility])     Past Surgical History:  Procedure Laterality Date  . ABDOMINAL HERNIA REPAIR    . BIOPSY  11/26/2015   Procedure: BIOPSY;  Surgeon: Rogene Houston, MD;  Location[facility];  Service: Endoscopy;;  gastric esophagus  . BLADDER TUMOR EXCISION    . BROW LIFT Bilateral 04/27/2019   Procedure: BILATERAL BLEPHAROPLASTY;  Surgeon: Baruch Goldmann, MD;  Location: [facility] ORS;  Service: Ophthalmology;  Laterality: Bilateral;  . CARDIAC CATHETERIZATION N/A 04/10/2015   Procedure: Left Heart Cath and Cors/Grafts Angiography;  Surgeon: Sherren Mocha, MD;  Location: [place] CV LAB;  Service: Cardiovascular;  Laterality: N/A;  . CARDIAC  CATHETERIZATION N/A 04/10/2015   Procedure: Coronary Stent Intervention;  Surgeon: Sherren Mocha, MD;  Location: [place] CV LAB;  Service: Cardiovascular;  Laterality: N/A;  . CATARACT EXTRACTION W/PHACO Right 06/23/2015   Procedure: CATARACT EXTRACTION PHACO AND INTRAOCULAR LENS PLACEMENT RIGHT EYE; CDE:  7.08;  Surgeon: Tonny Branch, MD;  Location: [facility] ORS;  Service: Ophthalmology;  Laterality: Right;  . CATARACT EXTRACTION W/PHACO Left 08/25/2015   Procedure: CATARACT EXTRACTION PHACO AND INTRAOCULAR LENS PLACEMENT LEFT EYE; CDE:  8.18;  Surgeon: Tonny Branch, MD;  Location: [facility] ORS;  Service: Ophthalmology;  Laterality: Left;  . COLONOSCOPY N/A 12/28/2012   Procedure: COLONOSCOPY;  Surgeon: Rogene Houston, MD;  [facility];  Service: Endoscopy;  Laterality: N/A;  830 rescheduled  . CORONARY ANGIOPLASTY WITH STENT PLACEMENT  2013; 04/10/2015   "this makes me a total of 7" (04/10/2015)  . CORONARY ARTERY BYPASS GRAFT  1997   with (LIMA)  . ESOPHAGEAL DILATION N/A 11/26/2015   Procedure: ESOPHAGEAL DILATION;  Surgeon: Rogene Houston, MD;  [facility];  Service: Endoscopy;  Laterality: N/A;  . ESOPHAGEAL DILATION N/A 05/23/2017   Procedure: ESOPHAGEAL DILATION;  Surgeon: Rogene Houston, MD;  [facility];  Service: Endoscopy;  Laterality: N/A;  . ESOPHAGEAL DILATION  N/A 06/16/2017   Procedure: ESOPHAGEAL DILATION;  Surgeon: Rogene Houston, MD;  [facility];  Service: Endoscopy;  Laterality: N/A;  . ESOPHAGOGASTRODUODENOSCOPY N/A 11/26/2015   Procedure: ESOPHAGOGASTRODUODENOSCOPY (EGD);  Surgeon: Rogene Houston, MD;  [facility];  Service: Endoscopy;  Laterality: N/A;  1:25  . ESOPHAGOGASTRODUODENOSCOPY N/A 05/23/2017   Procedure: ESOPHAGOGASTRODUODENOSCOPY (EGD);  Surgeon: Rogene Houston, MD;  [facility];  Service: Endoscopy;  Laterality: N/A;  1:15  . ESOPHAGOGASTRODUODENOSCOPY N/A 06/16/2017   Procedure:  ESOPHAGOGASTRODUODENOSCOPY (EGD);  Surgeon: Rogene Houston, MD;  [facility];  Service: Endoscopy;  Laterality: N/A;  1240  . HERNIA REPAIR Left   . KNEE CARTILAGE SURGERY Bilateral   . LEFT HEART CATH AND CORS/GRAFTS ANGIOGRAPHY N/A 09/08/2016   Procedure: LEFT HEART CATH AND CORS/GRAFTS ANGIOGRAPHY;  Surgeon: Martinique, Peter M, MD;  Location: [place] CV LAB;  Service: Cardiovascular;  Laterality: N/A;  . LEFT HEART CATHETERIZATION WITH CORONARY ANGIOGRAM N/A 09/08/2011   Procedure: LEFT HEART CATHETERIZATION WITH CORONARY ANGIOGRAM;  Surgeon: Sherren Mocha, MD;  Location[facility] CATH LAB;  Service: Cardiovascular;  Laterality: N/A;  . PERCUTANEOUS CORONARY STENT INTERVENTION (PCI-S) Right 09/08/2011   Procedure: PERCUTANEOUS CORONARY STENT INTERVENTION (PCI-S);  Surgeon: Sherren Mocha, MD;  Location: [facility] CATH LAB;  Service: Cardiovascular;  Laterality: Right;  . ROBOT ASSISTED LAPAROSCOPIC RADICAL PROSTATECTOMY      There were no vitals filed for this visit.    Subjective Assessment - 11/06/19 1010    Subjective [name] states that his neck began bothering him over six months ago.  He states that he hurts equally on both sides and is having difficulty moving his neck.  He denies radicular sx.    Pertinent History stent,  HTN,    Limitations House hold activities;Lifting    Patient Stated Goals to get some exercises to get my motion back    Currently in Pain? Yes    Pain Score 1    pain will go up to a 3 then he stops   Pain Location Neck    Pain Orientation Lower    Pain Descriptors / Indicators Tightness;Throbbing    Pain Type Chronic pain    Pain Onset More than a month ago    Pain Frequency Constant    Aggravating Factors  moving his head    Pain Relieving Factors not sure    Effect of Pain on Daily Activities just deals with the pain.              [facility] PT Assessment - 11/06/19 0001      Assessment   Medical Diagnosis cervical pain     Referring Provider  (PT) Collene Mares    Prior Therapy none       Precautions   Precautions None      Restrictions   Weight Bearing Restrictions No      Balance Screen   Has the patient fallen in the past 6 months No    Has the patient had a decrease in activity level because of a fear of falling?  No    Is the patient reluctant to leave their home because of a fear of falling?  No      Cognition   Overall Cognitive Status Within Functional Limits for tasks assessed      Observation/Other Assessments   Focus on Therapeutic Outcomes (FOTO)  50       Posture/Postural Control   Posture/Postural Control Postural  limitations    Postural Limitations Rounded Shoulders;Forward head;Increased thoracic kyphosis      ROM / Strength   AROM / PROM / Strength AROM;Strength      AROM   AROM Assessment Site Cervical    Cervical Flexion 28    Cervical Extension 25   reps start to give him a headache.    Cervical - Right Side Bend 10    Cervical - Left Side Bend 7    Cervical - Right Rotation 29    Cervical - Left Rotation 29      Strength   Strength Assessment Site Cervical    Cervical Extension 4/5    Cervical - Right Side Bend 4/5    Cervical - Left Side Bend 4+/5                      Objective measurements completed on examination: See above findings.       [facility] PT Treatment/Exercise - 11/06/19 0001      Exercises   Exercises Neck      Neck Exercises: Seated   Neck Retraction 10 reps    Neck Retraction Limitations scapular retraction x 10     Lateral Flexion Both;5 reps      Neck Exercises: Supine   Cervical Rotation Both;5 reps      Manual Therapy   Manual Therapy Joint mobilization    Manual therapy comments done seperate from all other aspects of treatment     Joint Mobilization grade II/III C3-T1 B                   PT Education - 11/06/19 1040    Education Details HEP    Person(s) Educated Patient    Methods Explanation    Comprehension  Verbalized understanding;Returned demonstration            PT Short Term Goals - 11/06/19 1048      PT SHORT TERM GOAL #1   Title Pt to be I in HEP to improve both cervical motion and pain    Time 2    Period Weeks    Status New    Target Date 11/20/19      PT SHORT TERM GOAL #2   Title PT to be able to rotate cervical spine B to 45 degrees to improve safety while driving.    Time 2    Period Weeks    Status New             PT Long Term Goals - 11/06/19 1113      PT LONG TERM GOAL #1   Title PT to be I in advance HEP to improve cervical pain and ROM    Time 4    Period Weeks    Status New    Target Date 12/04/19      PT LONG TERM GOAL #2   Title PT to be able to rotate cervical area to both the right and the left 60 degrees without experiencing any increased pain.    Time 4    Period Weeks    Status New      PT LONG TERM GOAL #3   Title PT cervical strength to 5/5 to decrease cervical pain to no greater than a 2/10 with all activities    Time 4    Period Weeks    Status New  Plan - 11/06/19 1042    Clinical Impression Statement [name] is a 74 yo male who has been having progressive neck pain and stiffness for over six months, he is not sure what caused it.  He has been referred to skilled PT. Evaluation demonstrates postural dysfunction, decreased ROM, decreased strength, and increased pain.  [name] will benefit from skilled PT to improve his postrue and ROM to improve his functional ability.    Personal Factors and Comorbidities Time since onset of injury/illness/exacerbation    Examination-Activity Limitations Carry;Reach Overhead    Examination-Participation Restrictions Driving;Meal Prep;Yard Work;Cleaning    Stability/Clinical Decision Making Evolving/Moderate complexity    Clinical Decision Making Moderate    Rehab Potential Good    PT Frequency 2x / week    PT Duration 4 weeks    PT Treatment/Interventions Manual  techniques;Patient/family education;Passive range of motion;Dry needling;Therapeutic exercise    PT Next Visit Plan continue with jt mobos and cervical traction  to imporve ROM, begin w back, supine sidebending, scapular and cervical retraction    PT Home Exercise Plan sitting scapular, cervical retraction and sidebending    Consulted and Agree with Plan of Care Patient           Patient will benefit from skilled therapeutic intervention in order to improve the following deficits and impairments:  Decreased range of motion, Decreased strength, Postural dysfunction, Increased fascial restricitons, Pain  Visit Diagnosis: Cervicalgia - Plan: PT plan of care cert/re-cert  Stiffness of neck - Plan: PT plan of care cert/re-cert     Problem List Patient Active Problem List   Diagnosis Date Noted  . Esophageal dysphagia 05/04/2017  . Chest pain 09/08/2016  . Dysphagia 09/22/2015  . Exertional angina ([place]) 04/10/2015  . Abnormal nuclear cardiac imaging test 04/08/2015  . Aortic stenosis 09/24/2011  . Unstable angina ([place]) 09/09/2011  . Murmur 04/19/2011  . Coronary artery disease 12/08/2010  . Hypothyroidism 09/28/2008  . Hyperlipidemia 09/28/2008  . Essential hypertension 09/28/2008  . CAD, ARTERY BYPASS GRAFT 09/28/2008  . DEGENERATIVE JOINT DISEASE 09/28/2008  . BACK PAIN, CHRONIC 09/28/2008   Rayetta Humphrey, PT CLT [phone] 11/06/2019, 11:17 AM  [facility] [address] [place], [place], [address] Phone: [phone]   Fax:  [phone]  Name: [name] MRN: [id number] Date of Birth: 01-24-1945

## 2019-11-07 DIAGNOSIS — C672 Malignant neoplasm of lateral wall of bladder: Secondary | ICD-10-CM | POA: Diagnosis not present

## 2019-11-09 ENCOUNTER — Ambulatory Visit (HOSPITAL_COMMUNITY): Payer: Medicare Other | Admitting: Physical Therapy

## 2019-11-09 ENCOUNTER — Encounter (HOSPITAL_COMMUNITY): Payer: Self-pay | Admitting: Physical Therapy

## 2019-11-09 ENCOUNTER — Other Ambulatory Visit: Payer: Self-pay

## 2019-11-09 DIAGNOSIS — M542 Cervicalgia: Secondary | ICD-10-CM | POA: Diagnosis not present

## 2019-11-09 DIAGNOSIS — M436 Torticollis: Secondary | ICD-10-CM | POA: Diagnosis not present

## 2019-11-09 NOTE — Patient Instructions (Signed)
Access Code: [email or web address] URL: [email or web address] Date: 11/09/2019 Prepared by: Mitzi Hansen Roark Rufo  Exercises Supine Chin Tuck - 1 x daily - 7 x weekly - 2 sets - 10 reps Shoulder External Rotation and Scapular Retraction - 1 x daily - 7 x weekly - 2 sets - 10 reps

## 2019-11-09 NOTE — Therapy (Signed)
[place], [place], [address] Phone: [phone]   Fax:  [phone]  Physical Therapy Treatment  Patient Details  Name: [name] MRN: [id number] Date of Birth: 05-22-1945 Referring Provider (PT): Collene Mares   Encounter Date: 11/09/2019   PT End of Session - 11/09/19 1447    Visit Number 2    Number of Visits 8    Date for PT Re-Evaluation 12/06/19    Authorization Type [employer] medicare    Progress Note Due on Visit 8    PT Start Time 1448    PT Stop Time 1528    PT Time Calculation (min) 40 min    Activity Tolerance Patient tolerated treatment well    Behavior During Therapy [facility] for tasks assessed/performed           Past Medical History:  Diagnosis Date  . Anginal pain ([place])   . Arthritis    "all over"  . Bladder tumor   . Chronic lower back pain   . Coronary artery disease    a. s/p CABG x 2 (LIMA->LAD, RIMA->RCA);  b. s/p multiple PCI's to Ramus;  c. 08/2011 Cath/PCI: LM 70% into ramus with 70-80% there->treated wtih 3.5x18 Xience Xpedition DES, LCX  nonobs, RCA occluded.  RIMA & LIMA patent, EF 55-65%  . DJD (degenerative joint disease)   . GERD (gastroesophageal reflux disease)   . History of gout   . Hyperlipidemia   . Hypertension   . Hypothyroidism   . Neuropathy   . OSA on CPAP   . Prostate cancer [facility])     Past Surgical History:  Procedure Laterality Date  . ABDOMINAL HERNIA REPAIR    . BIOPSY  11/26/2015   Procedure: BIOPSY;  Surgeon: Rogene Houston, MD;  [facility];  Service: Endoscopy;;  gastric esophagus  . BLADDER TUMOR EXCISION    . BROW LIFT Bilateral 04/27/2019   Procedure: BILATERAL BLEPHAROPLASTY;  Surgeon: Baruch Goldmann, MD;  Location: [facility] ORS;  Service: Ophthalmology;  Laterality: Bilateral;  . CARDIAC CATHETERIZATION N/A 04/10/2015   Procedure: Left Heart Cath and Cors/Grafts Angiography;  Surgeon: Sherren Mocha, MD;  Location: [place] CV LAB;   Service: Cardiovascular;  Laterality: N/A;  . CARDIAC CATHETERIZATION N/A 04/10/2015   Procedure: Coronary Stent Intervention;  Surgeon: Sherren Mocha, MD;  Location: [place] CV LAB;  Service: Cardiovascular;  Laterality: N/A;  . CATARACT EXTRACTION W/PHACO Right 06/23/2015   Procedure: CATARACT EXTRACTION PHACO AND INTRAOCULAR LENS PLACEMENT RIGHT EYE; CDE:  7.08;  Surgeon: Tonny Branch, MD;  Location: [facility] ORS;  Service: Ophthalmology;  Laterality: Right;  . CATARACT EXTRACTION W/PHACO Left 08/25/2015   Procedure: CATARACT EXTRACTION PHACO AND INTRAOCULAR LENS PLACEMENT LEFT EYE; CDE:  8.18;  Surgeon: Tonny Branch, MD;  Location: [facility] OR[facility];  Service: Ophthalmology;  Laterality: Left;  . COLONOSCOPY N/A 12/28/2012   Procedure: COLONOSCOPY;  Surgeon: Rogene Houston, MD;  [facility];  Service: Endoscopy;  Laterality: N/A;  830 rescheduled  . CORONARY ANGIOPLASTY WITH STENT PLACEMENT  2013; 04/10/2015   "this makes me a total of 7" (04/10/2015)  . CORONARY ARTERY BYPASS GRAFT  1997   with (LIMA)  . ESOPHAGEAL DILATION N/A 11/26/2015   Procedure: ESOPHAGEAL DILATION;  Surgeon: Rogene Houston, MD;  [facility];  Service: Endoscopy;  Laterality: N/A;  . ESOPHAGEAL DILATION N/A 05/23/2017   Procedure: ESOPHAGEAL DILATION;  Surgeon: Rogene Houston, MD;  [facility];  Service: Endoscopy;  Laterality: N/A;  . ESOPHAGEAL DILATION N/A 06/16/2017   Procedure: ESOPHAGEAL DILATION;  Surgeon: Rogene Houston, MD;  [facility];  Service: Endoscopy;  Laterality: N/A;  . ESOPHAGOGASTRODUODENOSCOPY N/A 11/26/2015   Procedure: ESOPHAGOGASTRODUODENOSCOPY (EGD);  Surgeon: Rogene Houston, MD;  [facility];  Service: Endoscopy;  Laterality: N/A;  1:25  . ESOPHAGOGASTRODUODENOSCOPY N/A 05/23/2017   Procedure: ESOPHAGOGASTRODUODENOSCOPY (EGD);  Surgeon: Rogene Houston, MD;  [facility];  Service: Endoscopy;  Laterality: N/A;  1:15  .  ESOPHAGOGASTRODUODENOSCOPY N/A 06/16/2017   Procedure: ESOPHAGOGASTRODUODENOSCOPY (EGD);  Surgeon: Rogene Houston, MD;  [facility];  Service: Endoscopy;  Laterality: N/A;  1240  . HERNIA REPAIR Left   . KNEE CARTILAGE SURGERY Bilateral   . LEFT HEART CATH AND CORS/GRAFTS ANGIOGRAPHY N/A 09/08/2016   Procedure: LEFT HEART CATH AND CORS/GRAFTS ANGIOGRAPHY;  Surgeon: Martinique, Peter M, MD;  Location: [place] CV LAB;  Service: Cardiovascular;  Laterality: N/A;  . LEFT HEART CATHETERIZATION WITH CORONARY ANGIOGRAM N/A 09/08/2011   Procedure: LEFT HEART CATHETERIZATION WITH CORONARY ANGIOGRAM;  Surgeon: Sherren Mocha, MD;  Location: [facility] CATH LAB;  Service: Cardiovascular;  Laterality: N/A;  . PERCUTANEOUS CORONARY STENT INTERVENTION (PCI-S) Right 09/08/2011   Procedure: PERCUTANEOUS CORONARY STENT INTERVENTION (PCI-S);  Surgeon: Sherren Mocha, MD;  Location: [facility] CATH LAB;  Service: Cardiovascular;  Laterality: Right;  . ROBOT ASSISTED LAPAROSCOPIC RADICAL PROSTATECTOMY      There were no vitals filed for this visit.   Subjective Assessment - 11/09/19 1448    Subjective He was doing the exercises and may have been doing too many of them. His motion improves and his stiffness.    Pertinent History stent,  HTN,    Limitations House hold activities;Lifting    Patient Stated Goals to get some exercises to get my motion back    Currently in Pain? Yes    Pain Score 1     Pain Location Neck    Pain Type Chronic pain    Pain Onset More than a month ago                             [facility] Adult PT Treatment/Exercise - 11/09/19 0001      Neck Exercises: Standing   Other Standing Exercises scapular retraction and shoulder ER 2x10 at wall      Neck Exercises: Seated   Neck Retraction 10 reps    Neck Retraction Limitations scapular retraction x 10     Other Seated Exercise thoracic extension overchair 10x 5 second holds      Neck Exercises: Supine   Neck Retraction 10  reps    Neck Retraction Limitations 2 sets    Cervical Rotation Both;10 reps    Other Supine Exercise lateral flexion 1x10 bilateral      Manual Therapy   Manual Therapy Joint mobilization;Manual Traction    Manual therapy comments done seperate from all other aspects of treatment     Joint Mobilization grade II/III C3-T1 r and L UPA in neutral and with progressive R and L rotation    Manual Traction 3x 60 second holds                  PT Education - 11/09/19 1446    Education Details Patient educated on exercise mechanics, HEP    Person(s) Educated Patient    Methods Explanation;Demonstration    Comprehension Verbalized understanding;Returned  demonstration            PT Short Term Goals - 11/06/19 1048      PT SHORT TERM GOAL #1   Title Pt to be I in HEP to improve both cervical motion and pain    Time 2    Period Weeks    Status New    Target Date 11/20/19      PT SHORT TERM GOAL #2   Title PT to be able to rotate cervical spine B to 45 degrees to improve safety while driving.    Time 2    Period Weeks    Status New             PT Long Term Goals - 11/06/19 1113      PT LONG TERM GOAL #1   Title PT to be I in advance HEP to improve cervical pain and ROM    Time 4    Period Weeks    Status New    Target Date 12/04/19      PT LONG TERM GOAL #2   Title PT to be able to rotate cervical area to both the right and the left 60 degrees without experiencing any increased pain.    Time 4    Period Weeks    Status New      PT LONG TERM GOAL #3   Title PT cervical strength to 5/5 to decrease cervical pain to no greater than a 2/10 with all activities    Time 4    Period Weeks    Status New                 Plan - 11/09/19 1447    Clinical Impression Statement Patient tolerates manual therapy intervention well for improving cervical ROM with minimal improvement following. Completed cervical spine mobs in neutral and progressive rotation with  continued stiffness following. He completes side bending in supine with c/o pain and stiffness but feels he is able to go further with repetition. Patient demonstrates good retraction mechanics in supine after initial verbal and tactile cueing. He requires cueing for thoracic extension exercise to limit excessive cervical spine motion and support his neck. Patient will continue to benefit from skilled physical therapy in order to reduce impairment and improve function.    Personal Factors and Comorbidities Time since onset of injury/illness/exacerbation    Examination-Activity Limitations Carry;Reach Overhead    Examination-Participation Restrictions Driving;Meal Prep;Yard Work;Cleaning    Stability/Clinical Decision Making Evolving/Moderate complexity    Rehab Potential Good    PT Frequency 2x / week    PT Duration 4 weeks    PT Treatment/Interventions Manual techniques;Patient/family education;Passive range of motion;Dry needling;Therapeutic exercise    PT Next Visit Plan continue with jt mobos and cervical traction  to imporve ROM, continue mobility exercises and postural strengtheing    PT Home Exercise Plan sitting scapular, cervical retraction and sidebending, 10/29 neck retraction supine, w back    Consulted and Agree with Plan of Care Patient           Patient will benefit from skilled therapeutic intervention in order to improve the following deficits and impairments:  Decreased range of motion, Decreased strength, Postural dysfunction, Increased fascial restricitons, Pain  Visit Diagnosis: Cervicalgia  Stiffness of neck     Problem List Patient Active Problem List   Diagnosis Date Noted  . Esophageal dysphagia 05/04/2017  . Chest pain 09/08/2016  . Dysphagia 09/22/2015  . Exertional angina ([place]) 04/10/2015  .  Abnormal nuclear cardiac imaging test 04/08/2015  . Aortic stenosis 09/24/2011  . Unstable angina ([place]) 09/09/2011  . Murmur 04/19/2011  . Coronary artery disease  12/08/2010  . Hypothyroidism 09/28/2008  . Hyperlipidemia 09/28/2008  . Essential hypertension 09/28/2008  . CAD, ARTERY BYPASS GRAFT 09/28/2008  . DEGENERATIVE JOINT DISEASE 09/28/2008  . BACK PAIN, CHRONIC 09/28/2008    3:36 PM, 11/09/19 Mearl Latin PT, DPT Physical Therapist at [place], [place], [address] Phone: [phone]   Fax:  [phone]  Name: [name] MRN: [id number] Date of Birth: 11/25/1945

## 2019-11-12 ENCOUNTER — Encounter (HOSPITAL_COMMUNITY): Payer: Medicare Other | Admitting: Physical Therapy

## 2019-11-12 ENCOUNTER — Ambulatory Visit (HOSPITAL_COMMUNITY): Payer: Medicare Other | Attending: Physician Assistant | Admitting: Physical Therapy

## 2019-11-12 ENCOUNTER — Other Ambulatory Visit: Payer: Self-pay

## 2019-11-12 DIAGNOSIS — M436 Torticollis: Secondary | ICD-10-CM | POA: Diagnosis not present

## 2019-11-12 DIAGNOSIS — M542 Cervicalgia: Secondary | ICD-10-CM | POA: Insufficient documentation

## 2019-11-12 NOTE — Therapy (Signed)
[place], [place], [address] Phone: [phone]   Fax:  [phone]  Physical Therapy Treatment  Patient Details  Name: [name] MRN: [id number] Date of Birth: 01/05/1946 Referring Provider (PT): Collene Mares   Encounter Date: 11/12/2019   PT End of Session - 11/12/19 1622    Visit Number 3    Number of Visits 8    Date for PT Re-Evaluation 12/06/19    Authorization Type UHC medicare    Progress Note Due on Visit 8    PT Start Time 7408    PT Stop Time 1540    PT Time Calculation (min) 48 min    Activity Tolerance Patient tolerated treatment well    Behavior During Therapy [facility] for tasks assessed/performed           Past Medical History:  Diagnosis Date  . Anginal pain ([place])   . Arthritis    "all over"  . Bladder tumor   . Chronic lower back pain   . Coronary artery disease    a. s/p CABG x 2 (LIMA->LAD, RIMA->RCA);  b. s/p multiple PCI's to Ramus;  c. 08/2011 Cath/PCI: LM 70% into ramus with 70-80% there->treated wtih 3.5x18 Xience Xpedition DES, LCX  nonobs, RCA occluded.  RIMA & LIMA patent, EF 55-65%  . DJD (degenerative joint disease)   . GERD (gastroesophageal reflux disease)   . History of gout   . Hyperlipidemia   . Hypertension   . Hypothyroidism   . Neuropathy   . OSA on CPAP   . Prostate cancer [facility])     Past Surgical History:  Procedure Laterality Date  . ABDOMINAL HERNIA REPAIR    . BIOPSY  11/26/2015   Procedure: BIOPSY;  Surgeon: Rogene Houston, MD;  [facility];  Service: Endoscopy;;  gastric esophagus  . BLADDER TUMOR EXCISION    . BROW LIFT Bilateral 04/27/2019   Procedure: BILATERAL BLEPHAROPLASTY;  Surgeon: Baruch Goldmann, MD;  Location: [facility];  Service: Ophthalmology;  Laterality: Bilateral;  . CARDIAC CATHETERIZATION N/A 04/10/2015   Procedure: Left Heart Cath and Cors/Grafts Angiography;  Surgeon: Sherren Mocha, MD;  Location: [place] CV LAB;   Service: Cardiovascular;  Laterality: N/A;  . CARDIAC CATHETERIZATION N/A 04/10/2015   Procedure: Coronary Stent Intervention;  Surgeon: Sherren Mocha, MD;  Location: [place] CV LAB;  Service: Cardiovascular;  Laterality: N/A;  . CATARACT EXTRACTION W/PHACO Right 06/23/2015   Procedure: CATARACT EXTRACTION PHACO AND INTRAOCULAR LENS PLACEMENT RIGHT EYE; CDE:  7.08;  Surgeon: Tonny Branch, MD;  Location: [facility];  Service: Ophthalmology;  Laterality: Right;  . CATARACT EXTRACTION W/PHACO Left 08/25/2015   Procedure: CATARACT EXTRACTION PHACO AND INTRAOCULAR LENS PLACEMENT LEFT EYE; CDE:  8.18;  Surgeon: Tonny Branch, MD;  Location: [facility];  Service: Ophthalmology;  Laterality: Left;  . COLONOSCOPY N/A 12/28/2012   Procedure: COLONOSCOPY;  Surgeon: Rogene Houston, MD;  [facility];  Service: Endoscopy;  Laterality: N/A;  830 rescheduled  . CORONARY ANGIOPLASTY WITH STENT PLACEMENT  2013; 04/10/2015   "this makes me a total of 7" (04/10/2015)  . CORONARY ARTERY BYPASS GRAFT  1997   with (LIMA)  . ESOPHAGEAL DILATION N/A 11/26/2015   Procedure: ESOPHAGEAL DILATION;  Surgeon: Rogene Houston, MD;  [facility];  Service: Endoscopy;  Laterality: N/A;  . ESOPHAGEAL DILATION N/A 05/23/2017   Procedure: ESOPHAGEAL DILATION;  Surgeon: Rogene Houston, MD;  [facility];  Service: Endoscopy;  Laterality: N/A;  . ESOPHAGEAL DILATION N/A 06/16/2017   Procedure: ESOPHAGEAL DILATION;  Surgeon: Rogene Houston, MD;  [facility];  Service: Endoscopy;  Laterality: N/A;  . ESOPHAGOGASTRODUODENOSCOPY N/A 11/26/2015   Procedure: ESOPHAGOGASTRODUODENOSCOPY (EGD);  Surgeon: Rogene Houston, MD;  [facility];  Service: Endoscopy;  Laterality: N/A;  1:25  . ESOPHAGOGASTRODUODENOSCOPY N/A 05/23/2017   Procedure: ESOPHAGOGASTRODUODENOSCOPY (EGD);  Surgeon: Rogene Houston, MD;  [facility];  Service: Endoscopy;  Laterality: N/A;  1:15  .  ESOPHAGOGASTRODUODENOSCOPY N/A 06/16/2017   Procedure: ESOPHAGOGASTRODUODENOSCOPY (EGD);  Surgeon: Rogene Houston, MD;  [facility];  Service: Endoscopy;  Laterality: N/A;  1240  . HERNIA REPAIR Left   . KNEE CARTILAGE SURGERY Bilateral   . LEFT HEART CATH AND CORS/GRAFTS ANGIOGRAPHY N/A 09/08/2016   Procedure: LEFT HEART CATH AND CORS/GRAFTS ANGIOGRAPHY;  Surgeon: Martinique, Peter M, MD;  Location: [place] CV LAB;  Service: Cardiovascular;  Laterality: N/A;  . LEFT HEART CATHETERIZATION WITH CORONARY ANGIOGRAM N/A 09/08/2011   Procedure: LEFT HEART CATHETERIZATION WITH CORONARY ANGIOGRAM;  Surgeon: Sherren Mocha, MD;  Location: [facility] CATH LAB;  Service: Cardiovascular;  Laterality: N/A;  . PERCUTANEOUS CORONARY STENT INTERVENTION (PCI-S) Right 09/08/2011   Procedure: PERCUTANEOUS CORONARY STENT INTERVENTION (PCI-S);  Surgeon: Sherren Mocha, MD;  Location: [facility] CATH LAB;  Service: Cardiovascular;  Laterality: Right;  . ROBOT ASSISTED LAPAROSCOPIC RADICAL PROSTATECTOMY      There were no vitals filed for this visit.   Subjective Assessment - 11/12/19 1459    Subjective pt states he went to the [place] over the weekend.  States currently his pain is 2/10, doing better.    Currently in Pain? Yes    Pain Score 2     Pain Location Neck    Pain Orientation Lower    Pain Descriptors / Indicators Throbbing;Tightness    Pain Type Chronic pain                             [facility] Adult PT Treatment/Exercise - 11/12/19 0001      Neck Exercises: Theraband   Scapula Retraction 10 reps;Red    Shoulder Extension 10 reps;Red    Rows 10 reps;Red      Neck Exercises: Standing   Other Standing Exercises scapular retraction and shoulder ER 2x10 at wall      Neck Exercises: Seated   Neck Retraction 10 reps    Neck Retraction Limitations scapular retraction x 10       Neck Exercises: Supine   Neck Retraction 10 reps    Neck Retraction Limitations 2 sets     Cervical Rotation Both;10 reps      Manual Therapy   Manual Therapy Soft tissue mobilization    Manual therapy comments done seperate from all other aspects of treatment     Soft tissue mobilization seated to bilateral scap, traps and cervical region                    PT Short Term Goals - 11/06/19 1048      PT SHORT TERM GOAL #1   Title Pt to be I in HEP to improve both cervical motion and pain    Time 2    Period Weeks    Status New    Target Date 11/20/19      PT SHORT TERM GOAL #2   Title PT to  be able to rotate cervical spine B to 45 degrees to improve safety while driving.    Time 2    Period Weeks    Status New             PT Long Term Goals - 11/06/19 1113      PT LONG TERM GOAL #1   Title PT to be I in advance HEP to improve cervical pain and ROM    Time 4    Period Weeks    Status New    Target Date 12/04/19      PT LONG TERM GOAL #2   Title PT to be able to rotate cervical area to both the right and the left 60 degrees without experiencing any increased pain.    Time 4    Period Weeks    Status New      PT LONG TERM GOAL #3   Title PT cervical strength to 5/5 to decrease cervical pain to no greater than a 2/10 with all activities    Time 4    Period Weeks    Status New                 Plan - 11/12/19 1615    Clinical Impression Statement began postural strengthening using theraband this session.  pt able to complete in overall good form and control.  Pt reported seated thoracic extension over chair elicited  pain following so instructed to hold this exercise for now.  Completed manual in seated positoin today to bil Upper traps, scaps and cervical region.  pt with large spasm in Lt UT that was 80% resolved with manual.  pt reported overall imrpovement at EOS with no pain and improved ROM.    Personal Factors and Comorbidities Time since onset of injury/illness/exacerbation    Examination-Activity Limitations Carry;Reach Overhead     Examination-Participation Restrictions Driving;Meal Prep;Yard Work;Cleaning    Stability/Clinical Decision Making Evolving/Moderate complexity    Rehab Potential Good    PT Frequency 2x / week    PT Duration 4 weeks    PT Treatment/Interventions Manual techniques;Patient/family education;Passive range of motion;Dry needling;Therapeutic exercise    PT Next Visit Plan continue with manual as neede, continue mobility exercises and postural strengtheing    PT Home Exercise Plan sitting scapular, cervical retraction and sidebending, 10/29 neck retraction supine, w back    Consulted and Agree with Plan of Care Patient           Patient will benefit from skilled therapeutic intervention in order to improve the following deficits and impairments:  Decreased range of motion, Decreased strength, Postural dysfunction, Increased fascial restricitons, Pain  Visit Diagnosis: Cervicalgia  Stiffness of neck     Problem List Patient Active Problem List   Diagnosis Date Noted  . Esophageal dysphagia 05/04/2017  . Chest pain 09/08/2016  . Dysphagia 09/22/2015  . Exertional angina ([place]) 04/10/2015  . Abnormal nuclear cardiac imaging test 04/08/2015  . Aortic stenosis 09/24/2011  . Unstable angina ([place]) 09/09/2011  . Murmur 04/19/2011  . Coronary artery disease 12/08/2010  . Hypothyroidism 09/28/2008  . Hyperlipidemia 09/28/2008  . Essential hypertension 09/28/2008  . CAD, ARTERY BYPASS GRAFT 09/28/2008  . DEGENERATIVE JOINT DISEASE 09/28/2008  . BACK PAIN, CHRONIC 09/28/2008   Teena Irani, PTA/CLT [phone]  Teena Irani 11/12/2019, 4:23 PM  [place] [address] [place], [place], [address] Phone: [phone]   Fax:  [phone]  Name: [name] [facility]  MRN: [id number] Date of Birth: November 14, 1945

## 2019-11-14 DIAGNOSIS — C672 Malignant neoplasm of lateral wall of bladder: Secondary | ICD-10-CM | POA: Diagnosis not present

## 2019-11-14 DIAGNOSIS — R82998 Other abnormal findings in urine: Secondary | ICD-10-CM | POA: Diagnosis not present

## 2019-11-14 DIAGNOSIS — R310 Gross hematuria: Secondary | ICD-10-CM | POA: Diagnosis not present

## 2019-11-15 ENCOUNTER — Ambulatory Visit (HOSPITAL_COMMUNITY): Payer: Medicare Other | Admitting: Physical Therapy

## 2019-11-15 ENCOUNTER — Other Ambulatory Visit: Payer: Self-pay

## 2019-11-15 ENCOUNTER — Encounter (HOSPITAL_COMMUNITY): Payer: Self-pay | Admitting: Physical Therapy

## 2019-11-15 DIAGNOSIS — M542 Cervicalgia: Secondary | ICD-10-CM | POA: Diagnosis not present

## 2019-11-15 DIAGNOSIS — M436 Torticollis: Secondary | ICD-10-CM | POA: Diagnosis not present

## 2019-11-15 NOTE — Therapy (Signed)
[place], [place], [address] Phone: [phone]   Fax:  [phone]  Physical Therapy Treatment  Patient Details  Name: [name] MRN: [id number] Date of Birth: Dec 17, 1945 Referring Provider (PT): Collene Mares   Encounter Date: 11/15/2019   PT End of Session - 11/15/19 1511    Visit Number 4    Number of Visits 8    Date for PT Re-Evaluation 12/06/19    Authorization Type UHC medicare    Progress Note Due on Visit 8    PT Start Time 1507    PT Stop Time 1550    PT Time Calculation (min) 43 min    Activity Tolerance Patient tolerated treatment well    Behavior During Therapy [facility] for tasks assessed/performed           Past Medical History:  Diagnosis Date  . Anginal pain ([place])   . Arthritis    "all over"  . Bladder tumor   . Chronic lower back pain   . Coronary artery disease    a. s/p CABG x 2 (LIMA->LAD, RIMA->RCA);  b. s/p multiple PCI's to Ramus;  c. 08/2011 Cath/PCI: LM 70% into ramus with 70-80% there->treated wtih 3.5x18 Xience Xpedition DES, LCX  nonobs, RCA occluded.  RIMA & LIMA patent, EF 55-65%  . DJD (degenerative joint disease)   . GERD (gastroesophageal reflux disease)   . History of gout   . Hyperlipidemia   . Hypertension   . Hypothyroidism   . Neuropathy   . OSA on CPAP   . Prostate cancer [facility])     Past Surgical History:  Procedure Laterality Date  . ABDOMINAL HERNIA REPAIR    . BIOPSY  11/26/2015   Procedure: BIOPSY;  Surgeon: Rogene Houston, MD;  [facility];  Service: Endoscopy;;  gastric esophagus  . BLADDER TUMOR EXCISION    . BROW LIFT Bilateral 04/27/2019   Procedure: BILATERAL BLEPHAROPLASTY;  Surgeon: Baruch Goldmann, MD;  Location: [facility] ORS;  Service: Ophthalmology;  Laterality: Bilateral;  . CARDIAC CATHETERIZATION N/A 04/10/2015   Procedure: Left Heart Cath and Cors/Grafts Angiography;  Surgeon: Sherren Mocha, MD;  Location: [place] CV LAB;   Service: Cardiovascular;  Laterality: N/A;  . CARDIAC CATHETERIZATION N/A 04/10/2015   Procedure: Coronary Stent Intervention;  Surgeon: Sherren Mocha, MD;  Location: [place] CV LAB;  Service: Cardiovascular;  Laterality: N/A;  . CATARACT EXTRACTION W/PHACO Right 06/23/2015   Procedure: CATARACT EXTRACTION PHACO AND INTRAOCULAR LENS PLACEMENT RIGHT EYE; CDE:  7.08;  Surgeon: Tonny Branch, MD;  Location: [facility] ORS;  Service: Ophthalmology;  Laterality: Right;  . CATARACT EXTRACTION W/PHACO Left 08/25/2015   Procedure: CATARACT EXTRACTION PHACO AND INTRAOCULAR LENS PLACEMENT LEFT EYE; CDE:  8.18;  Surgeon: Tonny Branch, MD;  Location: [facility] ORS;  Service: Ophthalmology;  Laterality: Left;  . COLONOSCOPY N/A 12/28/2012   Procedure: COLONOSCOPY;  Surgeon: Rogene Houston, MD;  [facility];  Service: Endoscopy;  Laterality: N/A;  830 rescheduled  . CORONARY ANGIOPLASTY WITH STENT PLACEMENT  2013; 04/10/2015   "this makes me a total of 7" (04/10/2015)  . CORONARY ARTERY BYPASS GRAFT  1997   with (LIMA)  . ESOPHAGEAL DILATION N/A 11/26/2015   Procedure: ESOPHAGEAL DILATION;  Surgeon: Rogene Houston, MD;  [facility];  Service: Endoscopy;  Laterality: N/A;  . ESOPHAGEAL DILATION N/A 05/23/2017   Procedure: ESOPHAGEAL DILATION;  Surgeon: Rogene Houston, MD;  [facility];  Service: Endoscopy;  Laterality: N/A;  . ESOPHAGEAL DILATION N/A 06/16/2017   Procedure: ESOPHAGEAL DILATION;  Surgeon: Rogene Houston, MD;  [facility];  Service: Endoscopy;  Laterality: N/A;  . ESOPHAGOGASTRODUODENOSCOPY N/A 11/26/2015   Procedure: ESOPHAGOGASTRODUODENOSCOPY (EGD);  Surgeon: Rogene Houston, MD;  [facility];  Service: Endoscopy;  Laterality: N/A;  1:25  . ESOPHAGOGASTRODUODENOSCOPY N/A 05/23/2017   Procedure: ESOPHAGOGASTRODUODENOSCOPY (EGD);  Surgeon: Rogene Houston, MD;  [facility];  Service: Endoscopy;  Laterality: N/A;  1:15  .  ESOPHAGOGASTRODUODENOSCOPY N/A 06/16/2017   Procedure: ESOPHAGOGASTRODUODENOSCOPY (EGD);  Surgeon: Rogene Houston, MD;  [facility];  Service: Endoscopy;  Laterality: N/A;  1240  . HERNIA REPAIR Left   . KNEE CARTILAGE SURGERY Bilateral   . LEFT HEART CATH AND CORS/GRAFTS ANGIOGRAPHY N/A 09/08/2016   Procedure: LEFT HEART CATH AND CORS/GRAFTS ANGIOGRAPHY;  Surgeon: Martinique, Peter M, MD;  Location: [place] CV LAB;  Service: Cardiovascular;  Laterality: N/A;  . LEFT HEART CATHETERIZATION WITH CORONARY ANGIOGRAM N/A 09/08/2011   Procedure: LEFT HEART CATHETERIZATION WITH CORONARY ANGIOGRAM;  Surgeon: Sherren Mocha, MD;  Location: [facility] CATH LAB;  Service: Cardiovascular;  Laterality: N/A;  . PERCUTANEOUS CORONARY STENT INTERVENTION (PCI-S) Right 09/08/2011   Procedure: PERCUTANEOUS CORONARY STENT INTERVENTION (PCI-S);  Surgeon: Sherren Mocha, MD;  Location: [facility] CATH LAB;  Service: Cardiovascular;  Laterality: Right;  . ROBOT ASSISTED LAPAROSCOPIC RADICAL PROSTATECTOMY      There were no vitals filed for this visit.   Subjective Assessment - 11/15/19 1510    Subjective Pateitn says his neck is stiff. Says its getting a little better but still stiff.    Currently in Pain? Yes    Pain Score 2     Pain Location Neck    Pain Descriptors / Indicators Tightness    Pain Type Chronic pain    Pain Onset More than a month ago    Pain Frequency Constant                             [facility] Adult PT Treatment/Exercise - 11/15/19 0001      Neck Exercises: Seated   Neck Retraction 10 reps;5 secs    Neck Retraction Limitations scapular retraction 10 x 5"     Other Seated Exercise thoracic extension (arms crossed) x5    Other Seated Exercise W back 10x 5"       Neck Exercises: Supine   Cervical Isometrics Right lateral flexion;Left lateral flexion;5 secs;5 reps      Manual Therapy   Manual Therapy Soft tissue mobilization    Manual therapy comments done seperate from all  other aspects of treatment     Soft tissue mobilization STM to bilateral upper trap, levator, periscapular area pre dry needle for tissue mobilization and trigger point ID, post needle on LT upper trap for tissue mobility and pain reduction             Trigger Point Dry Needling - 11/15/19 0001    Consent Given? Yes    Muscles Treated Head and Neck Upper trapezius    Other Dry Needling 1 needle to LT upper trap, pistoning, patient in prone, tolerated well, needle size .43mm x 33mm    Upper Trapezius Response Twitch reponse elicited;Palpable increased muscle length                  PT Short Term Goals - 11/06/19 1048  PT SHORT TERM GOAL #1   Title Pt to be I in HEP to improve both cervical motion and pain    Time 2    Period Weeks    Status New    Target Date 11/20/19      PT SHORT TERM GOAL #2   Title PT to be able to rotate cervical spine B to 45 degrees to improve safety while driving.    Time 2    Period Weeks    Status New             PT Long Term Goals - 11/06/19 1113      PT LONG TERM GOAL #1   Title PT to be I in advance HEP to improve cervical pain and ROM    Time 4    Period Weeks    Status New    Target Date 12/04/19      PT LONG TERM GOAL #2   Title PT to be able to rotate cervical area to both the right and the left 60 degrees without experiencing any increased pain.    Time 4    Period Weeks    Status New      PT LONG TERM GOAL #3   Title PT cervical strength to 5/5 to decrease cervical pain to no greater than a 2/10 with all activities    Time 4    Period Weeks    Status New                 Plan - 11/15/19 1642    Clinical Impression Statement Patient tolerated session well overall today. Patient did note discomfort in LT shoulder with attempted thoracic extension excursions. Activity held. Added cervical isometric and W backs for postural strengthening progressions. Patient with palpable trigger points in bilateral upper  trapezius with LT>RT. Educated patient on trial of dry needling. Discussed procedure, contraindications, common side effects. Patient verbalized consent. Dry needling to LT upper trap performed with patient in prone position. Patient tolerated well, no noted side effect at time of application. Patient did demo decreased palpable trigger point, improved muscle length and decreased reported cervical pain, in addition to improved cervical mobility post needling. Patient educated on addition of cervical isometric to HEP and follow up with response to dry needling next week for possible needling other side.    Personal Factors and Comorbidities Time since onset of injury/illness/exacerbation    Examination-Activity Limitations Carry;Reach Overhead    Examination-Participation Restrictions Driving;Meal Prep;Yard Work;Cleaning    Stability/Clinical Decision Making Evolving/Moderate complexity    Rehab Potential Good    PT Frequency 2x / week    PT Duration 4 weeks    PT Treatment/Interventions Manual techniques;Patient/family education;Passive range of motion;Dry needling;Therapeutic exercise    PT Next Visit Plan Assess response to dry needling. Continue with manual as needed, continue mobility exercises and postural strengthening as tolerated.    PT Home Exercise Plan sitting scapular, cervical retraction and sidebending, 10/29 neck retraction supine, w back 11/15/19: cervical sidebend isometrics    Consulted and Agree with Plan of Care Patient           Patient will benefit from skilled therapeutic intervention in order to improve the following deficits and impairments:  Decreased range of motion, Decreased strength, Postural dysfunction, Increased fascial restricitons, Pain  Visit Diagnosis: Cervicalgia  Stiffness of neck     Problem List Patient Active Problem List   Diagnosis Date Noted  . Esophageal dysphagia 05/04/2017  .  Chest pain 09/08/2016  . Dysphagia 09/22/2015  . Exertional  angina ([place]) 04/10/2015  . Abnormal nuclear cardiac imaging test 04/08/2015  . Aortic stenosis 09/24/2011  . Unstable angina ([place]) 09/09/2011  . Murmur 04/19/2011  . Coronary artery disease 12/08/2010  . Hypothyroidism 09/28/2008  . Hyperlipidemia 09/28/2008  . Essential hypertension 09/28/2008  . CAD, ARTERY BYPASS GRAFT 09/28/2008  . DEGENERATIVE JOINT DISEASE 09/28/2008  . BACK PAIN, CHRONIC 09/28/2008    4:52 PM, 11/15/19 Josue Hector PT DPT  Physical Therapist with [facility]  [phone] [place] [address] [place], [place], [address] Phone: [phone]   Fax:  [phone]  Name: [name] MRN: [id number] Date of Birth: 1945/09/02

## 2019-11-19 ENCOUNTER — Other Ambulatory Visit: Payer: Self-pay

## 2019-11-19 ENCOUNTER — Ambulatory Visit (HOSPITAL_COMMUNITY): Payer: Medicare Other

## 2019-11-19 ENCOUNTER — Encounter (HOSPITAL_COMMUNITY): Payer: Self-pay

## 2019-11-19 DIAGNOSIS — M436 Torticollis: Secondary | ICD-10-CM | POA: Diagnosis not present

## 2019-11-19 DIAGNOSIS — M542 Cervicalgia: Secondary | ICD-10-CM

## 2019-11-19 NOTE — Therapy (Signed)
[name], [place], [address] Phone: [phone]   Fax:  [phone]  Physical Therapy Treatment  Patient Details  Name: [name] MRN: [id number] Date of Birth: 01/14/1945 Referring Provider (PT): Collene Mares   Encounter Date: 11/19/2019   PT End of Session - 11/19/19 0848    Visit Number 5    Number of Visits 8    Date for PT Re-Evaluation 12/06/19    Authorization Type UHC medicare    Progress Note Due on Visit 8    PT Start Time 0850    PT Stop Time 0930    PT Time Calculation (min) 40 min    Activity Tolerance Patient tolerated treatment well    Behavior During Therapy [facility] for tasks assessed/performed           Past Medical History:  Diagnosis Date  . Anginal pain ([place])   . Arthritis    "all over"  . Bladder tumor   . Chronic lower back pain   . Coronary artery disease    a. s/p CABG x 2 (LIMA->LAD, RIMA->RCA);  b. s/p multiple PCI's to Ramus;  c. 08/2011 Cath/PCI: LM 70% into ramus with 70-80% there->treated wtih 3.5x18 Xience Xpedition DES, LCX  nonobs, RCA occluded.  RIMA & LIMA patent, EF 55-65%  . DJD (degenerative joint disease)   . GERD (gastroesophageal reflux disease)   . History of gout   . Hyperlipidemia   . Hypertension   . Hypothyroidism   . Neuropathy   . OSA on CPAP   . Prostate cancer [facility])     Past Surgical History:  Procedure Laterality Date  . ABDOMINAL HERNIA REPAIR    . BIOPSY  11/26/2015   Procedure: BIOPSY;  Surgeon: Rogene Houston, MD;  [facility];  Service: Endoscopy;;  gastric esophagus  . BLADDER TUMOR EXCISION    . BROW LIFT Bilateral 04/27/2019   Procedure: BILATERAL BLEPHAROPLASTY;  Surgeon: Baruch Goldmann, MD;  Location: [facility];  Service: Ophthalmology;  Laterality: Bilateral;  . CARDIAC CATHETERIZATION N/A 04/10/2015   Procedure: Left Heart Cath and Cors/Grafts Angiography;  Surgeon: Sherren Mocha, MD;  Location: [place] CV LAB;   Service: Cardiovascular;  Laterality: N/A;  . CARDIAC CATHETERIZATION N/A 04/10/2015   Procedure: Coronary Stent Intervention;  Surgeon: Sherren Mocha, MD;  Location: [place] CV LAB;  Service: Cardiovascular;  Laterality: N/A;  . CATARACT EXTRACTION W/PHACO Right 06/23/2015   Procedure: CATARACT EXTRACTION PHACO AND INTRAOCULAR LENS PLACEMENT RIGHT EYE; CDE:  7.08;  Surgeon: Tonny Branch, MD;  Location: [facility];  Service: Ophthalmology;  Laterality: Right;  . CATARACT EXTRACTION W/PHACO Left 08/25/2015   Procedure: CATARACT EXTRACTION PHACO AND INTRAOCULAR LENS PLACEMENT LEFT EYE; CDE:  8.18;  Surgeon: Tonny Branch, MD;  Location: [facility];  Service: Ophthalmology;  Laterality: Left;  . COLONOSCOPY N/A 12/28/2012   Procedure: COLONOSCOPY;  Surgeon: Rogene Houston, MD;  [facility];  Service: Endoscopy;  Laterality: N/A;  830 rescheduled  . CORONARY ANGIOPLASTY WITH STENT PLACEMENT  2013; 04/10/2015   "this makes me a total of 7" (04/10/2015)  . CORONARY ARTERY BYPASS GRAFT  1997   with (LIMA)  . ESOPHAGEAL DILATION N/A 11/26/2015   Procedure: ESOPHAGEAL DILATION;  Surgeon: Rogene Houston, MD;  [facility];  Service: Endoscopy;  Laterality: N/A;  . ESOPHAGEAL DILATION N/A 05/23/2017   Procedure: ESOPHAGEAL DILATION;  Surgeon: Rogene Houston, MD;  [facility];  Service: Endoscopy;  Laterality: N/A;  . ESOPHAGEAL DILATION N/A 06/16/2017   Procedure: ESOPHAGEAL DILATION;  Surgeon: Rogene Houston, MD;  [facility];  Service: Endoscopy;  Laterality: N/A;  . ESOPHAGOGASTRODUODENOSCOPY N/A 11/26/2015   Procedure: ESOPHAGOGASTRODUODENOSCOPY (EGD);  Surgeon: Rogene Houston, MD;  [facility];  Service: Endoscopy;  Laterality: N/A;  1:25  . ESOPHAGOGASTRODUODENOSCOPY N/A 05/23/2017   Procedure: ESOPHAGOGASTRODUODENOSCOPY (EGD);  Surgeon: Rogene Houston, MD;  [facility];  Service: Endoscopy;  Laterality: N/A;  1:15  .  ESOPHAGOGASTRODUODENOSCOPY N/A 06/16/2017   Procedure: ESOPHAGOGASTRODUODENOSCOPY (EGD);  Surgeon: Rogene Houston, MD;  [facility];  Service: Endoscopy;  Laterality: N/A;  1240  . HERNIA REPAIR Left   . KNEE CARTILAGE SURGERY Bilateral   . LEFT HEART CATH AND CORS/GRAFTS ANGIOGRAPHY N/A 09/08/2016   Procedure: LEFT HEART CATH AND CORS/GRAFTS ANGIOGRAPHY;  Surgeon: Martinique, Peter M, MD;  Location: [place] CV LAB;  Service: Cardiovascular;  Laterality: N/A;  . LEFT HEART CATHETERIZATION WITH CORONARY ANGIOGRAM N/A 09/08/2011   Procedure: LEFT HEART CATHETERIZATION WITH CORONARY ANGIOGRAM;  Surgeon: Sherren Mocha, MD;  Location: [facility] CATH LAB;  Service: Cardiovascular;  Laterality: N/A;  . PERCUTANEOUS CORONARY STENT INTERVENTION (PCI-S) Right 09/08/2011   Procedure: PERCUTANEOUS CORONARY STENT INTERVENTION (PCI-S);  Surgeon: Sherren Mocha, MD;  Location: [facility] CATH LAB;  Service: Cardiovascular;  Laterality: Right;  . ROBOT ASSISTED LAPAROSCOPIC RADICAL PROSTATECTOMY      There were no vitals filed for this visit.   Subjective Assessment - 11/19/19 0848    Subjective Patient states he has had increased neck rotation and able to look up higher as well. Decreased neck pain as well with 1/10 pain today.    Pain Onset More than a month ago            [facility] Adult PT Treatment/Exercise - 11/19/19 0001      Neck Exercises: Theraband   Shoulder Extension 10 reps;Green    Rows 10 reps;Green      Neck Exercises: Seated   Neck Retraction 10 reps;5 secs    Neck Retraction Limitations scapular retraction 10 x 5"       Neck Exercises: Supine   Neck Retraction 10 reps;5 secs    Neck Retraction Limitations 1 set w/ arms out to side      Manual Therapy   Manual Therapy Soft tissue mobilization;Passive ROM    Manual therapy comments done seperate from all other aspects of treatment     Soft tissue mobilization STM/trigger point to R UT, levator and periscap muscles    Passive ROM  seated, arms cross, thoracic rotation 30 sec hold each side with deep breathes      Neck Exercises: Stretches   Chest Stretch 1 rep;30 seconds   doorway - arms at side, 90 degrees and abducted   Other Neck Stretches supine head on pink ball for neck rotation 5 sec hold x10 each side             PT Education - 11/19/19 0945    Education Details Discussed purpose and technique of interventions throughout session. Advanced HEP.    Person(s) Educated Patient    Methods Explanation;Demonstration;Handout    Comprehension Verbalized understanding            PT Short Term Goals - 11/19/19 0849      PT SHORT TERM GOAL #1   Title Pt to be I in HEP to improve both cervical motion and pain  Time 2    Period Weeks    Status Achieved    Target Date 11/20/19      PT SHORT TERM GOAL #2   Title PT to be able to rotate cervical spine B to 45 degrees to improve safety while driving.    Time 2    Period Weeks    Status On-going             PT Long Term Goals - 11/19/19 0849      PT LONG TERM GOAL #1   Title PT to be I in advance HEP to improve cervical pain and ROM    Time 4    Period Weeks    Status New      PT LONG TERM GOAL #2   Title PT to be able to rotate cervical area to both the right and the left 60 degrees without experiencing any increased pain.    Time 4    Period Weeks    Status New      PT LONG TERM GOAL #3   Title PT cervical strength to 5/5 to decrease cervical pain to no greater than a 2/10 with all activities    Time 4    Period Weeks    Status On-going              Plan - 11/19/19 0849    Clinical Impression Statement Patient tolerated session well overall today.  Manual therapy to right upper trapezius, levator scapulae, and periscapular muscles with decreased pain complaints. Neck retraction with scapular retraction and arms abducted to 75 degrees created referred pain to left eye and posterior to left ear. Added supine neck rotation with head on  pink ball to facilitate rotation and standing doorway stretch with arms in 3 positions - at side, 90 degrees abduction and overhead. Added doorway stretch, supine neck rotation, and self trigger point therapy to HEP. Increased seated rows and shoulder extension to green theraband. Patient reported no pain at end of session. Patient will continue to benefit from skilled physical therapy in order to reduce impairment, increase motion and improve function.    Personal Factors and Comorbidities Time since onset of injury/illness/exacerbation    Examination-Activity Limitations Carry;Reach Overhead    Examination-Participation Restrictions Driving;Meal Prep;Yard Work;Cleaning    Stability/Clinical Decision Making Evolving/Moderate complexity    Rehab Potential Good    PT Frequency 2x / week    PT Duration 4 weeks    PT Treatment/Interventions Manual techniques;Patient/family education;Passive range of motion;Dry needling;Therapeutic exercise    PT Next Visit Plan Assess response to manual trigger point therapy. Assess if patient wants dry needling - if so needs to switch therapists. Continue with manual as needed, continue mobility exercises and postural strengthening as tolerated. Added GTB rows and shoulder extensions to HEP.    PT Home Exercise Plan sitting scapular, cervical retraction and sidebending, 10/29 neck retraction supine, w back 11/15/19: cervical sidebend isometrics; supine neck rotation on mylar ball, self trigger point therapy, doorway pec stretch in 3 positions    Consulted and Agree with Plan of Care Patient           Patient will benefit from skilled therapeutic intervention in order to improve the following deficits and impairments:  Decreased range of motion, Decreased strength, Postural dysfunction, Increased fascial restricitons, Pain  Visit Diagnosis: Cervicalgia  Stiffness of neck     Problem List Patient Active Problem List   Diagnosis Date Noted  . Esophageal  dysphagia 05/04/2017  .  Chest pain 09/08/2016  . Dysphagia 09/22/2015  . Exertional angina ([place]) 04/10/2015  . Abnormal nuclear cardiac imaging test 04/08/2015  . Aortic stenosis 09/24/2011  . Unstable angina ([place]) 09/09/2011  . Murmur 04/19/2011  . Coronary artery disease 12/08/2010  . Hypothyroidism 09/28/2008  . Hyperlipidemia 09/28/2008  . Essential hypertension 09/28/2008  . CAD, ARTERY BYPASS GRAFT 09/28/2008  . DEGENERATIVE JOINT DISEASE 09/28/2008  . BACK PAIN, CHRONIC 09/28/2008    Floria Raveling. Hartnett-Rands, MS, PT [facility] [place] #[id number] 11/19/2019, 9:58 AM  [place] [address] [place], [place], [address] Phone: [phone]   Fax:  [phone]  Name: [name] MRN: [id number] Date of Birth: 03-Oct-1945

## 2019-11-19 NOTE — Patient Instructions (Addendum)
Trigger point self massage with tennis ball in toe of tube sock against a wall - to tolerance  Rotation: Supine    Head on mylar ball. Rotate head and neck to the left; hold 5 seconds, then rotate to the right; hold 5 seconds. Repeat _10___ times per set.   Copyright  VHI. All rights reserved.    CHEST: Doorway, Bilateral - Standing    Standing in doorway, place hands on wall with hands at hips, elbows bent at shoulder height, and hands overhead. Step forward. Hold 30_ seconds each of the 3 positions.   Copyright  VHI. All rights reserved.

## 2019-11-21 ENCOUNTER — Encounter (HOSPITAL_COMMUNITY): Payer: Medicare Other | Admitting: Physical Therapy

## 2019-11-21 DIAGNOSIS — C672 Malignant neoplasm of lateral wall of bladder: Secondary | ICD-10-CM | POA: Diagnosis not present

## 2019-11-22 ENCOUNTER — Other Ambulatory Visit: Payer: Self-pay

## 2019-11-22 ENCOUNTER — Ambulatory Visit (HOSPITAL_COMMUNITY): Payer: Medicare Other | Admitting: Physical Therapy

## 2019-11-22 DIAGNOSIS — M436 Torticollis: Secondary | ICD-10-CM

## 2019-11-22 DIAGNOSIS — M542 Cervicalgia: Secondary | ICD-10-CM

## 2019-11-22 NOTE — Therapy (Signed)
[place], [place], [address] Phone: [phone]   Fax:  [phone]  Physical Therapy Treatment  Patient Details  Name: [name] MRN: [id number] Date of Birth: [birth year] Referring Provider (PT): Collene Mares   Encounter Date: 11/22/2019   PT End of Session - 11/22/19 1441    Visit Number 6    Number of Visits 8    Date for PT Re-Evaluation 12/06/19    Authorization Type UHC medicare    Progress Note Due on Visit 8    PT Start Time 1400    PT Stop Time 1440    PT Time Calculation (min) 40 min    Activity Tolerance Patient tolerated treatment well    Behavior During Therapy [facility] for tasks assessed/performed           Past Medical History:  Diagnosis Date  . Anginal pain ([place])   . Arthritis    "all over"  . Bladder tumor   . Chronic lower back pain   . Coronary artery disease    a. s/p CABG x 2 (LIMA->LAD, RIMA->RCA);  b. s/p multiple PCI's to Ramus;  c. 08/2011 Cath/PCI: LM 70% into ramus with 70-80% there->treated wtih 3.5x18 Xience Xpedition DES, LCX  nonobs, RCA occluded.  RIMA & LIMA patent, EF 55-65%  . DJD (degenerative joint disease)   . GERD (gastroesophageal reflux disease)   . History of gout   . Hyperlipidemia   . Hypertension   . Hypothyroidism   . Neuropathy   . OSA on CPAP   . Prostate cancer [facility])     Past Surgical History:  Procedure Laterality Date  . ABDOMINAL HERNIA REPAIR    . BIOPSY  11/26/2015   Procedure: BIOPSY;  Surgeon: Rogene Houston, MD;  [facility];  Service: Endoscopy;;  gastric esophagus  . BLADDER TUMOR EXCISION    . BROW LIFT Bilateral 04/27/2019   Procedure: BILATERAL BLEPHAROPLASTY;  Surgeon: Baruch Goldmann, MD;  Location: [facility] ORS;  Service: Ophthalmology;  Laterality: Bilateral;  . CARDIAC CATHETERIZATION N/A 04/10/2015   Procedure: Left Heart Cath and Cors/Grafts Angiography;  Surgeon: Sherren Mocha, MD;  Location: [place] CV LAB;   Service: Cardiovascular;  Laterality: N/A;  . CARDIAC CATHETERIZATION N/A 04/10/2015   Procedure: Coronary Stent Intervention;  Surgeon: Sherren Mocha, MD;  Location: [place] CV LAB;  Service: Cardiovascular;  Laterality: N/A;  . CATARACT EXTRACTION W/PHACO Right 06/23/2015   Procedure: CATARACT EXTRACTION PHACO AND INTRAOCULAR LENS PLACEMENT RIGHT EYE; CDE:  7.08;  Surgeon: Tonny Branch, MD;  Location: [facility] ORS;  Service: Ophthalmology;  Laterality: Right;  . CATARACT EXTRACTION W/PHACO Left 08/25/2015   Procedure: CATARACT EXTRACTION PHACO AND INTRAOCULAR LENS PLACEMENT LEFT EYE; CDE:  8.18;  Surgeon: Tonny Branch, MD;  Location: [facility] ORS;  Service: Ophthalmology;  Laterality: Left;  . COLONOSCOPY N/A 12/28/2012   Procedure: COLONOSCOPY;  Surgeon: Rogene Houston, MD;  [facility];  Service: Endoscopy;  Laterality: N/A;  830 rescheduled  . CORONARY ANGIOPLASTY WITH STENT PLACEMENT  2013; 04/10/2015   "this makes me a total of 7" (04/10/2015)  . CORONARY ARTERY BYPASS GRAFT  1997   with (LIMA)  . ESOPHAGEAL DILATION N/A 11/26/2015   Procedure: ESOPHAGEAL DILATION;  Surgeon: Rogene Houston, MD;  [facility];  Service: Endoscopy;  Laterality: N/A;  . ESOPHAGEAL DILATION N/A 05/23/2017   Procedure: ESOPHAGEAL DILATION;  Surgeon: Rogene Houston, MD;  [facility];  Service: Endoscopy;  Laterality: N/A;  . ESOPHAGEAL DILATION N/A 06/16/2017   Procedure: ESOPHAGEAL DILATION;  Surgeon: Rogene Houston, MD;  [facility];  Service: Endoscopy;  Laterality: N/A;  . ESOPHAGOGASTRODUODENOSCOPY N/A 11/26/2015   Procedure: ESOPHAGOGASTRODUODENOSCOPY (EGD);  Surgeon: Rogene Houston, MD;  [facility];  Service: Endoscopy;  Laterality: N/A;  1:25  . ESOPHAGOGASTRODUODENOSCOPY N/A 05/23/2017   Procedure: ESOPHAGOGASTRODUODENOSCOPY (EGD);  Surgeon: Rogene Houston, MD;  [facility];  Service: Endoscopy;  Laterality: N/A;  1:15  .  ESOPHAGOGASTRODUODENOSCOPY N/A 06/16/2017   Procedure: ESOPHAGOGASTRODUODENOSCOPY (EGD);  Surgeon: Rogene Houston, MD;  [facility];  Service: Endoscopy;  Laterality: N/A;  1240  . HERNIA REPAIR Left   . KNEE CARTILAGE SURGERY Bilateral   . LEFT HEART CATH AND CORS/GRAFTS ANGIOGRAPHY N/A 09/08/2016   Procedure: LEFT HEART CATH AND CORS/GRAFTS ANGIOGRAPHY;  Surgeon: Martinique, Peter M, MD;  Location: [place] CV LAB;  Service: Cardiovascular;  Laterality: N/A;  . LEFT HEART CATHETERIZATION WITH CORONARY ANGIOGRAM N/A 09/08/2011   Procedure: LEFT HEART CATHETERIZATION WITH CORONARY ANGIOGRAM;  Surgeon: Sherren Mocha, MD;  Location: [facility] CATH LAB;  Service: Cardiovascular;  Laterality: N/A;  . PERCUTANEOUS CORONARY STENT INTERVENTION (PCI-S) Right 09/08/2011   Procedure: PERCUTANEOUS CORONARY STENT INTERVENTION (PCI-S);  Surgeon: Sherren Mocha, MD;  Location: [facility] CATH LAB;  Service: Cardiovascular;  Laterality: Right;  . ROBOT ASSISTED LAPAROSCOPIC RADICAL PROSTATECTOMY      There were no vitals filed for this visit.   Subjective Assessment - 11/22/19 1441    Subjective Pt states that he feels better overall but his neck feels about the same.  Pt states that he feels that the dry needling helped him the most.    Pertinent History stent,  HTN,    Limitations House hold activities;Lifting    Patient Stated Goals to get some exercises to get my motion back    Currently in Pain? Yes    Pain Score 6     Pain Location Neck    Pain Orientation Lower    Pain Descriptors / Indicators Tightness    Pain Type Chronic pain    Pain Onset More than a month ago    Pain Frequency Constant    Aggravating Factors  moving his head    Pain Relieving Factors not sure    Effect of Pain on Daily Activities just deals with the pain              [facility] PT Assessment - 11/22/19 0001      AROM   Cervical Flexion 35   was 28   Cervical Extension 30   was 25    Cervical - Right Side Bend 18   was 10     Cervical - Left Side Bend 18   was 7   Cervical - Right Rotation 40   was 20   Cervical - Left Rotation 45   was 29                         [facility] Adult PT Treatment/Exercise - 11/22/19 0001      Exercises   Exercises Neck      Neck Exercises: Seated   Other Seated Exercise cervical/thoracic excursions x 5     Other Seated Exercise sitting contract and relax to improve rotation       Neck Exercises: Supine   Neck Retraction 10 reps;5 secs    Neck  Retraction Limitations scapular retraction  x 10     Other Supine Exercise contract relax for improved rotation       Manual Therapy   Manual Therapy Joint mobilization;Soft tissue mobilization;Passive ROM;Manual Traction                    PT Short Term Goals - 11/19/19 0849      PT SHORT TERM GOAL #1   Title Pt to be I in HEP to improve both cervical motion and pain    Time 2    Period Weeks    Status Achieved    Target Date 11/20/19      PT SHORT TERM GOAL #2   Title PT to be able to rotate cervical spine B to 45 degrees to improve safety while driving.    Time 2    Period Weeks    Status On-going             PT Long Term Goals - 11/19/19 0849      PT LONG TERM GOAL #1   Title PT to be I in advance HEP to improve cervical pain and ROM    Time 4    Period Weeks    Status New      PT LONG TERM GOAL #2   Title PT to be able to rotate cervical area to both the right and the left 60 degrees without experiencing any increased pain.    Time 4    Period Weeks    Status New      PT LONG TERM GOAL #3   Title PT cervical strength to 5/5 to decrease cervical pain to no greater than a 2/10 with all activities    Time 4    Period Weeks    Status On-going                 Plan - 11/22/19 1445    Clinical Impression Statement Pt remeasured, he has gained some motion but continues to be severely limited.  Pt feels dry needling has helped him the most therefore we will attempt to keep pt  with therapist that can do this procedure.    Personal Factors and Comorbidities Time since onset of injury/illness/exacerbation    Examination-Activity Limitations Carry;Reach Overhead    Examination-Participation Restrictions Driving;Meal Prep;Yard Work;Cleaning    Stability/Clinical Decision Making Evolving/Moderate complexity    Rehab Potential Good    PT Frequency 2x / week    PT Duration 4 weeks    PT Treatment/Interventions Manual techniques;Patient/family education;Passive range of motion;Dry needling;Therapeutic exercise    PT Next Visit Plan Complete dry needling as able,  Good results with contract relax so continue with this technique.    PT Home Exercise Plan sitting scapular, cervical retraction and sidebending, 10/29 neck retraction supine, w back 11/15/19: cervical sidebend isometrics; supine neck rotation on mylar ball, self trigger point therapy, doorway pec stretch in 3 positions    Consulted and Agree with Plan of Care Patient           Patient will benefit from skilled therapeutic intervention in order to improve the following deficits and impairments:  Decreased range of motion, Decreased strength, Postural dysfunction, Increased fascial restricitons, Pain  Visit Diagnosis: Cervicalgia  Stiffness of neck     Problem List Patient Active Problem List   Diagnosis Date Noted  . Esophageal dysphagia 05/04/2017  . Chest pain 09/08/2016  . Dysphagia 09/22/2015  . Exertional angina ([place]) 04/10/2015  .  Abnormal nuclear cardiac imaging test 04/08/2015  . Aortic stenosis 09/24/2011  . Unstable angina ([place]) 09/09/2011  . Murmur 04/19/2011  . Coronary artery disease 12/08/2010  . Hypothyroidism 09/28/2008  . Hyperlipidemia 09/28/2008  . Essential hypertension 09/28/2008  . CAD, ARTERY BYPASS GRAFT 09/28/2008  . DEGENERATIVE JOINT DISEASE 09/28/2008  . BACK PAIN, CHRONIC 09/28/2008  Rayetta Humphrey, PT CLT [phone] 11/22/2019, 2:49 PM  [place] [address] [place], [place], [address] Phone: [phone]   Fax:  [phone]  Name: [name] MRN: [id number] Date of Birth: 1945/02/23

## 2019-11-26 ENCOUNTER — Encounter (HOSPITAL_COMMUNITY): Payer: Self-pay | Admitting: Physical Therapy

## 2019-11-26 ENCOUNTER — Other Ambulatory Visit: Payer: Self-pay

## 2019-11-26 ENCOUNTER — Ambulatory Visit (HOSPITAL_COMMUNITY): Payer: Medicare Other | Admitting: Physical Therapy

## 2019-11-26 DIAGNOSIS — M542 Cervicalgia: Secondary | ICD-10-CM | POA: Diagnosis not present

## 2019-11-26 DIAGNOSIS — M436 Torticollis: Secondary | ICD-10-CM | POA: Diagnosis not present

## 2019-11-26 NOTE — Therapy (Signed)
[place], [place], [address] Phone: [phone]   Fax:  [phone]  Physical Therapy Treatment  Patient Details  Name: [name] MRN: [id number] Date of Birth: [birth year] Referring Provider (PT): Collene Mares   Encounter Date: 11/26/2019   PT End of Session - 11/26/19 1001    Visit Number 7    Number of Visits 8    Date for PT Re-Evaluation 12/06/19    Authorization Type [employer] medicare    Progress Note Due on Visit 8    PT Start Time 1000    PT Stop Time 1040    PT Time Calculation (min) 40 min    Activity Tolerance Patient tolerated treatment well    Behavior During Therapy [facility] for tasks assessed/performed           Past Medical History:  Diagnosis Date  . Anginal pain ([place])   . Arthritis    "all over"  . Bladder tumor   . Chronic lower back pain   . Coronary artery disease    a. s/p CABG x 2 (LIMA->LAD, RIMA->RCA);  b. s/p multiple PCI's to Ramus;  c. 08/2011 Cath/PCI: LM 70% into ramus with 70-80% there->treated wtih 3.5x18 Xience Xpedition DES, LCX  nonobs, RCA occluded.  RIMA & LIMA patent, EF 55-65%  . DJD (degenerative joint disease)   . GERD (gastroesophageal reflux disease)   . History of gout   . Hyperlipidemia   . Hypertension   . Hypothyroidism   . Neuropathy   . OSA on CPAP   . Prostate cancer [facility])     Past Surgical History:  Procedure Laterality Date  . ABDOMINAL HERNIA REPAIR    . BIOPSY  11/26/2015   Procedure: BIOPSY;  Surgeon: Rogene Houston, MD;  [facility];  Service: Endoscopy;;  gastric esophagus  . BLADDER TUMOR EXCISION    . BROW LIFT Bilateral 04/27/2019   Procedure: BILATERAL BLEPHAROPLASTY;  Surgeon: Baruch Goldmann, MD;  Location: [facility] ORS;  Service: Ophthalmology;  Laterality: Bilateral;  . CARDIAC CATHETERIZATION N/A 04/10/2015   Procedure: Left Heart Cath and Cors/Grafts Angiography;  Surgeon: Sherren Mocha, MD;  Location: [place] CV LAB;   Service: Cardiovascular;  Laterality: N/A;  . CARDIAC CATHETERIZATION N/A 04/10/2015   Procedure: Coronary Stent Intervention;  Surgeon: Sherren Mocha, MD;  Location: [place] CV LAB;  Service: Cardiovascular;  Laterality: N/A;  . CATARACT EXTRACTION W/PHACO Right 06/23/2015   Procedure: CATARACT EXTRACTION PHACO AND INTRAOCULAR LENS PLACEMENT RIGHT EYE; CDE:  7.08;  Surgeon: Tonny Branch, MD;  Location: [facility];  Service: Ophthalmology;  Laterality: Right;  . CATARACT EXTRACTION W/PHACO Left 08/25/2015   Procedure: CATARACT EXTRACTION PHACO AND INTRAOCULAR LENS PLACEMENT LEFT EYE; CDE:  8.18;  Surgeon: Tonny Branch, MD;  Location: [facility] OR[facility];  Service: Ophthalmology;  Laterality: Left;  . COLONOSCOPY N/A 12/28/2012   Procedure: COLONOSCOPY;  Surgeon: Rogene Houston, MD;  [facility];  Service: Endoscopy;  Laterality: N/A;  830 rescheduled  . CORONARY ANGIOPLASTY WITH STENT PLACEMENT  2013; 04/10/2015   "this makes me a total of 7" (04/10/2015)  . CORONARY ARTERY BYPASS GRAFT  1997   with (LIMA)  . ESOPHAGEAL DILATION N/A 11/26/2015   Procedure: ESOPHAGEAL DILATION;  Surgeon: Rogene Houston, MD;  [facility];  Service: Endoscopy;  Laterality: N/A;  . ESOPHAGEAL DILATION N/A 05/23/2017   Procedure: ESOPHAGEAL DILATION;  Surgeon: Rogene Houston, MD;  [facility];  Service: Endoscopy;  Laterality: N/A;  . ESOPHAGEAL DILATION N/A 06/16/2017   Procedure: ESOPHAGEAL DILATION;  Surgeon: Rogene Houston, MD;  [facility];  Service: Endoscopy;  Laterality: N/A;  . ESOPHAGOGASTRODUODENOSCOPY N/A 11/26/2015   Procedure: ESOPHAGOGASTRODUODENOSCOPY (EGD);  Surgeon: Rogene Houston, MD;  [facility];  Service: Endoscopy;  Laterality: N/A;  1:25  . ESOPHAGOGASTRODUODENOSCOPY N/A 05/23/2017   Procedure: ESOPHAGOGASTRODUODENOSCOPY (EGD);  Surgeon: Rogene Houston, MD;  [facility];  Service: Endoscopy;  Laterality: N/A;  1:15  .  ESOPHAGOGASTRODUODENOSCOPY N/A 06/16/2017   Procedure: ESOPHAGOGASTRODUODENOSCOPY (EGD);  Surgeon: Rogene Houston, MD;  [facility];  Service: Endoscopy;  Laterality: N/A;  1240  . HERNIA REPAIR Left   . KNEE CARTILAGE SURGERY Bilateral   . LEFT HEART CATH AND CORS/GRAFTS ANGIOGRAPHY N/A 09/08/2016   Procedure: LEFT HEART CATH AND CORS/GRAFTS ANGIOGRAPHY;  Surgeon: Martinique, Peter M, MD;  Location: [place] CV LAB;  Service: Cardiovascular;  Laterality: N/A;  . LEFT HEART CATHETERIZATION WITH CORONARY ANGIOGRAM N/A 09/08/2011   Procedure: LEFT HEART CATHETERIZATION WITH CORONARY ANGIOGRAM;  Surgeon: Sherren Mocha, MD;  Location: [facility] CATH LAB;  Service: Cardiovascular;  Laterality: N/A;  . PERCUTANEOUS CORONARY STENT INTERVENTION (PCI-S) Right 09/08/2011   Procedure: PERCUTANEOUS CORONARY STENT INTERVENTION (PCI-S);  Surgeon: Sherren Mocha, MD;  Location: [facility] CATH LAB;  Service: Cardiovascular;  Laterality: Right;  . ROBOT ASSISTED LAPAROSCOPIC RADICAL PROSTATECTOMY      There were no vitals filed for this visit.                                PT Short Term Goals - 11/19/19 0849      PT SHORT TERM GOAL #1   Title Pt to be I in HEP to improve both cervical motion and pain    Time 2    Period Weeks    Status Achieved    Target Date 11/20/19      PT SHORT TERM GOAL #2   Title PT to be able to rotate cervical spine B to 45 degrees to improve safety while driving.    Time 2    Period Weeks    Status On-going             PT Long Term Goals - 11/19/19 0849      PT LONG TERM GOAL #1   Title PT to be I in advance HEP to improve cervical pain and ROM    Time 4    Period Weeks    Status New      PT LONG TERM GOAL #2   Title PT to be able to rotate cervical area to both the right and the left 60 degrees without experiencing any increased pain.    Time 4    Period Weeks    Status New      PT LONG TERM GOAL #3   Title PT cervical strength to  5/5 to decrease cervical pain to no greater than a 2/10 with all activities    Time 4    Period Weeks    Status On-going                  Patient will benefit from skilled therapeutic intervention in order to improve the following deficits and impairments:     Visit Diagnosis: Cervicalgia  Stiffness of neck     Problem List Patient Active Problem List  Diagnosis Date Noted  . Esophageal dysphagia 05/04/2017  . Chest pain 09/08/2016  . Dysphagia 09/22/2015  . Exertional angina ([place]) 04/10/2015  . Abnormal nuclear cardiac imaging test 04/08/2015  . Aortic stenosis 09/24/2011  . Unstable angina ([place]) 09/09/2011  . Murmur 04/19/2011  . Coronary artery disease 12/08/2010  . Hypothyroidism 09/28/2008  . Hyperlipidemia 09/28/2008  . Essential hypertension 09/28/2008  . CAD, ARTERY BYPASS GRAFT 09/28/2008  . DEGENERATIVE JOINT DISEASE 09/28/2008  . BACK PAIN, CHRONIC 09/28/2008    10:02 AM, 11/26/19 Jerene Pitch, DPT Physical Therapy with [facility]  [phone] office  [employer] [address] [place], [place], [address] Phone: [phone]   Fax:  [phone]  Name: [name] MRN: [id number] Date of Birth: [birth year]

## 2019-11-26 NOTE — Therapy (Signed)
[facility] [address] [place], [place], [address] Phone: [phone]   Fax:  [phone]  Physical Therapy Treatment  Patient Details  Name: [name] MRN: [id number] Date of Birth: 1945-09-29 Referring Provider (PT): Collene Mares   Encounter Date: 11/26/2019   PT End of Session - 11/26/19 1001    Visit Number 7    Number of Visits 8    Date for PT Re-Evaluation 12/06/19    Authorization Type UHC medicare    Progress Note Due on Visit 8    PT Start Time 1000    PT Stop Time 1045    PT Time Calculation (min) 45 min    Activity Tolerance Patient tolerated treatment well    Behavior During Therapy [facility] for tasks assessed/performed           Past Medical History:  Diagnosis Date  . Anginal pain ([place])   . Arthritis    "all over"  . Bladder tumor   . Chronic lower back pain   . Coronary artery disease    a. s/p CABG x 2 (LIMA->LAD, RIMA->RCA);  b. s/p multiple PCI's to Ramus;  c. 08/2011 Cath/PCI: LM 70% into ramus with 70-80% there->treated wtih 3.5x18 Xience Xpedition DES, LCX  nonobs, RCA occluded.  RIMA & LIMA patent, EF 55-65%  . DJD (degenerative joint disease)   . GERD (gastroesophageal reflux disease)   . History of gout   . Hyperlipidemia   . Hypertension   . Hypothyroidism   . Neuropathy   . OSA on CPAP   . Prostate cancer [facility])     Past Surgical History:  Procedure Laterality Date  . ABDOMINAL HERNIA REPAIR    . BIOPSY  11/26/2015   Procedure: BIOPSY;  Surgeon: Rogene Houston, MD;  [facility];  Service: Endoscopy;;  gastric esophagus  . BLADDER TUMOR EXCISION    . BROW LIFT Bilateral 04/27/2019   Procedure: BILATERAL BLEPHAROPLASTY;  Surgeon: Baruch Goldmann, MD;  Location: [facility] ORS;  Service: Ophthalmology;  Laterality: Bilateral;  . CARDIAC CATHETERIZATION N/A 04/10/2015   Procedure: Left Heart Cath and Cors/Grafts Angiography;  Surgeon: Sherren Mocha, MD;  Location: [place] CV LAB;   Service: Cardiovascular;  Laterality: N/A;  . CARDIAC CATHETERIZATION N/A 04/10/2015   Procedure: Coronary Stent Intervention;  Surgeon: Sherren Mocha, MD;  Location: [place] CV LAB;  Service: Cardiovascular;  Laterality: N/A;  . CATARACT EXTRACTION W/PHACO Right 06/23/2015   Procedure: CATARACT EXTRACTION PHACO AND INTRAOCULAR LENS PLACEMENT RIGHT EYE; CDE:  7.08;  Surgeon: Tonny Branch, MD;  Location: [facility] ORS;  Service: Ophthalmology;  Laterality: Right;  . CATARACT EXTRACTION W/PHACO Left 08/25/2015   Procedure: CATARACT EXTRACTION PHACO AND INTRAOCULAR LENS PLACEMENT LEFT EYE; CDE:  8.18;  Surgeon: Tonny Branch, MD;  Location: [facility] ORS;  Service: Ophthalmology;  Laterality: Left;  . COLONOSCOPY N/A 12/28/2012   Procedure: COLONOSCOPY;  Surgeon: Rogene Houston, MD;  Location: [facility];  Service: Endoscopy;  Laterality: N/A;  830 rescheduled  . CORONARY ANGIOPLASTY WITH STENT PLACEMENT  2013; 04/10/2015   "this makes me a total of 7" (04/10/2015)  . CORONARY ARTERY BYPASS GRAFT  1997   with (LIMA)  . ESOPHAGEAL DILATION N/A 11/26/2015   Procedure: ESOPHAGEAL DILATION;  Surgeon: Rogene Houston, MD;  [facility];  Service: Endoscopy;  Laterality: N/A;  . ESOPHAGEAL DILATION N/A 05/23/2017   Procedure: ESOPHAGEAL DILATION;  Surgeon: Rogene Houston, MD;  [facility];  Service: Endoscopy;  Laterality: N/A;  . ESOPHAGEAL DILATION N/A 06/16/2017   Procedure: ESOPHAGEAL DILATION;  Surgeon: Rogene Houston, MD;  [facility];  Service: Endoscopy;  Laterality: N/A;  . ESOPHAGOGASTRODUODENOSCOPY N/A 11/26/2015   Procedure: ESOPHAGOGASTRODUODENOSCOPY (EGD);  Surgeon: Rogene Houston, MD;  [facility];  Service: Endoscopy;  Laterality: N/A;  1:25  . ESOPHAGOGASTRODUODENOSCOPY N/A 05/23/2017   Procedure: ESOPHAGOGASTRODUODENOSCOPY (EGD);  Surgeon: Rogene Houston, MD;  [facility];  Service: Endoscopy;  Laterality: N/A;  1:15  .  ESOPHAGOGASTRODUODENOSCOPY N/A 06/16/2017   Procedure: ESOPHAGOGASTRODUODENOSCOPY (EGD);  Surgeon: Rogene Houston, MD;  [facility];  Service: Endoscopy;  Laterality: N/A;  1240  . HERNIA REPAIR Left   . KNEE CARTILAGE SURGERY Bilateral   . LEFT HEART CATH AND CORS/GRAFTS ANGIOGRAPHY N/A 09/08/2016   Procedure: LEFT HEART CATH AND CORS/GRAFTS ANGIOGRAPHY;  Surgeon: Martinique, Peter M, MD;  Location: [place] CV LAB;  Service: Cardiovascular;  Laterality: N/A;  . LEFT HEART CATHETERIZATION WITH CORONARY ANGIOGRAM N/A 09/08/2011   Procedure: LEFT HEART CATHETERIZATION WITH CORONARY ANGIOGRAM;  Surgeon: Sherren Mocha, MD;  Location: [facility] CATH LAB;  Service: Cardiovascular;  Laterality: N/A;  . PERCUTANEOUS CORONARY STENT INTERVENTION (PCI-S) Right 09/08/2011   Procedure: PERCUTANEOUS CORONARY STENT INTERVENTION (PCI-S);  Surgeon: Sherren Mocha, MD;  Location: [facility] CATH LAB;  Service: Cardiovascular;  Laterality: Right;  . ROBOT ASSISTED LAPAROSCOPIC RADICAL PROSTATECTOMY      There were no vitals filed for this visit.   Subjective Assessment - 11/26/19 1008    Subjective After last session he was able to move his neck a good bit. States that he was sore up until yesterday in the back of his neck. States he is now better then baseline. States that he can now move up and down better then previously. Current pain level reported as 2/10 and described as sharp with movement.  but states that he has taken tramadol and Celebrex this morning. Reports overall he feels about 30% better since therapy.    Pertinent History stent,  HTN,    Limitations House hold activities;Lifting    Patient Stated Goals to get some exercises to get my motion back    Pain Onset More than a month ago              [facility] PT Assessment - 11/26/19 0001      Assessment   Medical Diagnosis cervical pain     Referring Provider (PT) Collene Mares                         [facility] Adult PT  Treatment/Exercise - 11/26/19 0001      Neck Exercises: Supine   Neck Retraction 5 secs;20 reps    Neck Retraction Limitations cues to perform upper cervical flexion    Other Supine Exercise TRA contraction with lower ribs coming together. 8 minutes of instructions and practice.       Neck Exercises: Prone   Other Prone Exercise scapular retraction 4x5 5" holds B       Manual Therapy   Manual Therapy Joint mobilization;Soft tissue mobilization    Manual therapy comments done seperate from all other aspects of treatment     Joint Mobilization PA to T1-6 grade II/III patient pron    Soft tissue mobilization to B UT, rhomboids, cervical paraspinals             Trigger Point Dry Needling - 11/26/19  0001    Consent Given? Yes    Education Handout Provided Previously provided    Muscles Treated Head and Neck Upper trapezius    Other Dry Needling 1 needle to Rt upper trap, pistoning, patient in prone, tolerated well, needle size .53mm x 22mm    Upper Trapezius Response Twitch reponse elicited;Palpable increased muscle length                PT Education - 11/26/19 1125    Education Details Educated patient on posture, deep core activation, how everything is connected and goals moving forward.    Person(s) Educated Patient    Methods Explanation    Comprehension Verbalized understanding            PT Short Term Goals - 11/19/19 0849      PT SHORT TERM GOAL #1   Title Pt to be I in HEP to improve both cervical motion and pain    Time 2    Period Weeks    Status Achieved    Target Date 11/20/19      PT SHORT TERM GOAL #2   Title PT to be able to rotate cervical spine B to 45 degrees to improve safety while driving.    Time 2    Period Weeks    Status On-going             PT Long Term Goals - 11/19/19 0849      PT LONG TERM GOAL #1   Title PT to be I in advance HEP to improve cervical pain and ROM    Time 4    Period Weeks    Status New      PT LONG TERM  GOAL #2   Title PT to be able to rotate cervical area to both the right and the left 60 degrees without experiencing any increased pain.    Time 4    Period Weeks    Status New      PT LONG TERM GOAL #3   Title PT cervical strength to 5/5 to decrease cervical pain to no greater than a 2/10 with all activities    Time 4    Period Weeks    Status On-going                 Plan - 11/26/19 1126    Clinical Impression Statement Patient tolerated needling well. Muscle twitch noted and reduced tension in muscles noted afterwards. Focused on education and TRA activation (upper TRA for rib approximation). Patient tolerated this well and was able to elicit good contraction with exhale focus. Cues to relax neck as head flexes when muscle is activation. Slight dizziness noted end of session with return to sitting upright but this resolved after a couple minutes. Will continue with focus on core activation, posture, and dry needling as needed.    Personal Factors and Comorbidities Time since onset of injury/illness/exacerbation    Examination-Activity Limitations Carry;Reach Overhead    Examination-Participation Restrictions Driving;Meal Prep;Yard Work;Cleaning    Stability/Clinical Decision Making Evolving/Moderate complexity    Rehab Potential Good    PT Frequency 2x / week    PT Duration 4 weeks    PT Treatment/Interventions Manual techniques;Patient/family education;Passive range of motion;Dry needling;Therapeutic exercise    PT Next Visit Plan PN next session f/u with dry needling as able, posture, upper core activation  Good results with contract relax so continue with this technique.    PT Home Exercise Plan sitting scapular,  cervical retraction and sidebending, 10/29 neck retraction supine, w back 11/15/19: cervical sidebend isometrics; supine neck rotation on mylar ball, self trigger point therapy, doorway pec stretch in 3 positions    Consulted and Agree with Plan of Care Patient            Patient will benefit from skilled therapeutic intervention in order to improve the following deficits and impairments:  Decreased range of motion, Decreased strength, Postural dysfunction, Increased fascial restricitons, Pain  Visit Diagnosis: Cervicalgia  Stiffness of neck     Problem List Patient Active Problem List   Diagnosis Date Noted  . Esophageal dysphagia 05/04/2017  . Chest pain 09/08/2016  . Dysphagia 09/22/2015  . Exertional angina ([place]) 04/10/2015  . Abnormal nuclear cardiac imaging test 04/08/2015  . Aortic stenosis 09/24/2011  . Unstable angina ([place]) 09/09/2011  . Murmur 04/19/2011  . Coronary artery disease 12/08/2010  . Hypothyroidism 09/28/2008  . Hyperlipidemia 09/28/2008  . Essential hypertension 09/28/2008  . CAD, ARTERY BYPASS GRAFT 09/28/2008  . DEGENERATIVE JOINT DISEASE 09/28/2008  . BACK PAIN, CHRONIC 09/28/2008   11:29 AM, 11/26/19 Jerene Pitch, DPT Physical Therapy with [facility]  [phone] office  [facility] [address] [place], [place], [address] Phone: [phone]   Fax:  [phone]  Name: [name] MRN: [id number] Date of Birth: Jun 01, 1945

## 2019-11-28 ENCOUNTER — Encounter (HOSPITAL_COMMUNITY): Payer: Medicare Other | Admitting: Physical Therapy

## 2019-11-28 DIAGNOSIS — C672 Malignant neoplasm of lateral wall of bladder: Secondary | ICD-10-CM | POA: Diagnosis not present

## 2019-11-30 ENCOUNTER — Encounter (HOSPITAL_COMMUNITY): Payer: Self-pay | Admitting: Physical Therapy

## 2019-11-30 ENCOUNTER — Other Ambulatory Visit: Payer: Self-pay

## 2019-11-30 ENCOUNTER — Ambulatory Visit (HOSPITAL_COMMUNITY): Payer: Medicare Other | Admitting: Physical Therapy

## 2019-11-30 DIAGNOSIS — M542 Cervicalgia: Secondary | ICD-10-CM | POA: Diagnosis not present

## 2019-11-30 DIAGNOSIS — M436 Torticollis: Secondary | ICD-10-CM | POA: Diagnosis not present

## 2019-11-30 NOTE — Therapy (Signed)
[facility] [address] [place], [place], [address] Phone: [phone]   Fax:  [phone]  Physical Therapy Treatment and Progress Note  Patient Details  Name: [name] MRN: [id number] Date of Birth: 1945/04/26 Referring Provider (PT): Collene Mares  Progress Note Reporting Period 11/06/19 to 11/30/19  See note below for Objective Data and Assessment of Progress/Goals.       Encounter Date: 11/30/2019   PT End of Session - 11/30/19 1318    Visit Number 8    Number of Visits 16    Date for PT Re-Evaluation 12/28/19    Authorization Type UHC medicare    Progress Note Due on Visit 16    PT Start Time 1318    PT Stop Time 1356    PT Time Calculation (min) 38 min    Activity Tolerance Patient tolerated treatment well    Behavior During Therapy [facility] for tasks assessed/performed           Past Medical History:  Diagnosis Date  . Anginal pain ([place])   . Arthritis    "all over"  . Bladder tumor   . Chronic lower back pain   . Coronary artery disease    a. s/p CABG x 2 (LIMA->LAD, RIMA->RCA);  b. s/p multiple PCI's to Ramus;  c. 08/2011 Cath/PCI: LM 70% into ramus with 70-80% there->treated wtih 3.5x18 Xience Xpedition DES, LCX  nonobs, RCA occluded.  RIMA & LIMA patent, EF 55-65%  . DJD (degenerative joint disease)   . GERD (gastroesophageal reflux disease)   . History of gout   . Hyperlipidemia   . Hypertension   . Hypothyroidism   . Neuropathy   . OSA on CPAP   . Prostate cancer [facility])     Past Surgical History:  Procedure Laterality Date  . ABDOMINAL HERNIA REPAIR    . BIOPSY  11/26/2015   Procedure: BIOPSY;  Surgeon: Rogene Houston, MD;  [facility];  Service: Endoscopy;;  gastric esophagus  . BLADDER TUMOR EXCISION    . BROW LIFT Bilateral 04/27/2019   Procedure: BILATERAL BLEPHAROPLASTY;  Surgeon: Baruch Goldmann, MD;  Location: [facility] ORS;  Service: Ophthalmology;  Laterality: Bilateral;  .  CARDIAC CATHETERIZATION N/A 04/10/2015   Procedure: Left Heart Cath and Cors/Grafts Angiography;  Surgeon: Sherren Mocha, MD;  Location: [place] CV LAB;  Service: Cardiovascular;  Laterality: N/A;  . CARDIAC CATHETERIZATION N/A 04/10/2015   Procedure: Coronary Stent Intervention;  Surgeon: Sherren Mocha, MD;  Location: [place] CV LAB;  Service: Cardiovascular;  Laterality: N/A;  . CATARACT EXTRACTION W/PHACO Right 06/23/2015   Procedure: CATARACT EXTRACTION PHACO AND INTRAOCULAR LENS PLACEMENT RIGHT EYE; CDE:  7.08;  Surgeon: Tonny Branch, MD;  Location: [facility] ORS;  Service: Ophthalmology;  Laterality: Right;  . CATARACT EXTRACTION W/PHACO Left 08/25/2015   Procedure: CATARACT EXTRACTION PHACO AND INTRAOCULAR LENS PLACEMENT LEFT EYE; CDE:  8.18;  Surgeon: Tonny Branch, MD;  Location: [facility] ORS;  Service: Ophthalmology;  Laterality: Left;  . COLONOSCOPY N/A 12/28/2012   Procedure: COLONOSCOPY;  Surgeon: Rogene Houston, MD;  Location: [facility];  Service: Endoscopy;  Laterality: N/A;  830 rescheduled  . CORONARY ANGIOPLASTY WITH STENT PLACEMENT  2013; 04/10/2015   "this makes me a total of 7" (04/10/2015)  . CORONARY ARTERY BYPASS GRAFT  1997   with (LIMA)  . ESOPHAGEAL DILATION N/A 11/26/2015   Procedure: ESOPHAGEAL DILATION;  Surgeon: Rogene Houston, MD;  [facility];  Service:  Endoscopy;  Laterality: N/A;  . ESOPHAGEAL DILATION N/A 05/23/2017   Procedure: ESOPHAGEAL DILATION;  Surgeon: Rogene Houston, MD;  [facility];  Service: Endoscopy;  Laterality: N/A;  . ESOPHAGEAL DILATION N/A 06/16/2017   Procedure: ESOPHAGEAL DILATION;  Surgeon: Rogene Houston, MD;  [facility];  Service: Endoscopy;  Laterality: N/A;  . ESOPHAGOGASTRODUODENOSCOPY N/A 11/26/2015   Procedure: ESOPHAGOGASTRODUODENOSCOPY (EGD);  Surgeon: Rogene Houston, MD;  [facility];  Service: Endoscopy;  Laterality: N/A;  1:25  . ESOPHAGOGASTRODUODENOSCOPY N/A 05/23/2017   Procedure:  ESOPHAGOGASTRODUODENOSCOPY (EGD);  Surgeon: Rogene Houston, MD;  [facility];  Service: Endoscopy;  Laterality: N/A;  1:15  . ESOPHAGOGASTRODUODENOSCOPY N/A 06/16/2017   Procedure: ESOPHAGOGASTRODUODENOSCOPY (EGD);  Surgeon: Rogene Houston, MD;  [facility];  Service: Endoscopy;  Laterality: N/A;  1240  . HERNIA REPAIR Left   . KNEE CARTILAGE SURGERY Bilateral   . LEFT HEART CATH AND CORS/GRAFTS ANGIOGRAPHY N/A 09/08/2016   Procedure: LEFT HEART CATH AND CORS/GRAFTS ANGIOGRAPHY;  Surgeon: Martinique, Peter M, MD;  Location: [place] CV LAB;  Service: Cardiovascular;  Laterality: N/A;  . LEFT HEART CATHETERIZATION WITH CORONARY ANGIOGRAM N/A 09/08/2011   Procedure: LEFT HEART CATHETERIZATION WITH CORONARY ANGIOGRAM;  Surgeon: Sherren Mocha, MD;  Location: [facility] CATH LAB;  Service: Cardiovascular;  Laterality: N/A;  . PERCUTANEOUS CORONARY STENT INTERVENTION (PCI-S) Right 09/08/2011   Procedure: PERCUTANEOUS CORONARY STENT INTERVENTION (PCI-S);  Surgeon: Sherren Mocha, MD;  Location: [facility] CATH LAB;  Service: Cardiovascular;  Laterality: Right;  . ROBOT ASSISTED LAPAROSCOPIC RADICAL PROSTATECTOMY      There were no vitals filed for this visit.   Subjective Assessment - 11/30/19 1323    Subjective States he has been practicing walking better like demonstrated last session but the posture increased his neck pain but that it is starting to get better. States that he feels posture is getting better. States current pain is 1/10 in same spot. States he feels 30% better.    Pertinent History stent,  HTN,    Limitations House hold activities;Lifting    Patient Stated Goals to get some exercises to get my motion back    Currently in Pain? Yes    Pain Score 1     Pain Location Neck    Pain Orientation Lower    Pain Descriptors / Indicators Tightness    Pain Onset More than a month ago              [facility] PT Assessment - 11/30/19 0001      Assessment   Medical Diagnosis  cervical pain     Referring Provider (PT) Collene Mares      Observation/Other Assessments   Focus on Therapeutic Outcomes (FOTO)  47% function    50% function     AROM   AROM Assessment Site Cervical    Cervical Flexion 28   pain along base of head   Cervical Extension 28    Cervical - Right Side Bend 10    Cervical - Left Side Bend 8    Cervical - Right Rotation 40    Cervical - Left Rotation 38      Strength   Cervical Extension 4+/5   pain on sides    Cervical - Right Side Bend 4+/5   pain on sides    Cervical - Left Side Bend 4+/5   pain on sides  [facility] Adult PT Treatment/Exercise - 11/30/19 0001      Neck Exercises: Supine   Other Supine Exercise cervical rotation supine x10 5" holds       Manual Therapy   Manual Therapy Joint mobilization;Soft tissue mobilization;Manual Traction    Manual therapy comments done seperate from all other aspects of treatment     Joint Mobilization medial glides to Cervical spine grade II for pain - performed bilaterally     Soft tissue mobilization STM to Bilateral  SCM, cervical paraspianls, rhomboids anterior scalenes     Manual Traction gentle traction to cerivcal spine.                   PT Education - 11/30/19 1337    Education Details FOTO score, objective measurements, HEP, POC moving forward    Person(s) Educated Patient    Methods Explanation    Comprehension Verbalized understanding            PT Short Term Goals - 11/19/19 0849      PT SHORT TERM GOAL #1   Title Pt to be I in HEP to improve both cervical motion and pain    Time 2    Period Weeks    Status Achieved    Target Date 11/20/19      PT SHORT TERM GOAL #2   Title PT to be able to rotate cervical spine B to 45 degrees to improve safety while driving.    Time 2    Period Weeks    Status On-going             PT Long Term Goals - 11/30/19 1336      PT LONG TERM GOAL #1   Title PT to be I in advance HEP  to improve cervical pain and ROM    Time 4    Period Weeks    Status On-going      PT LONG TERM GOAL #2   Title PT to be able to rotate cervical area to both the right and the left 60 degrees without experiencing any increased pain.    Time 4    Period Weeks    Status On-going      PT LONG TERM GOAL #3   Title PT cervical strength to 5/5 to decrease cervical pain to no greater than a 2/10 with all activities    Time 4    Period Weeks    Status On-going                 Plan - 11/30/19 1512    Clinical Impression Statement Patient present for a progress note on this date. Limited progress noted in FOTO score and objective measurements, but patient with subjective report of overall improvement. Discussed findings and plan on adding in dry needling more regularly to plan of care. Patient interested in continuing with addition of dry needling. Extending POC additional 4 weeks to continue to work on cervical symptoms and improve overall function.    Personal Factors and Comorbidities Time since onset of injury/illness/exacerbation    Examination-Activity Limitations Carry;Reach Overhead    Examination-Participation Restrictions Driving;Meal Prep;Yard Work;Cleaning    Stability/Clinical Decision Making Evolving/Moderate complexity    Rehab Potential Good    PT Frequency 2x / week    PT Duration 4 weeks    PT Treatment/Interventions Manual techniques;Patient/family education;Passive range of motion;Dry needling;Therapeutic exercise    PT Next Visit Plan dry needling as able, posture, upper core activation  Good results with contract relax so continue with this technique.    PT Home Exercise Plan sitting scapular, cervical retraction and sidebending, 10/29 neck retraction supine, w back 11/15/19: cervical sidebend isometrics; supine neck rotation on mylar ball, self trigger point therapy, doorway pec stretch in 3 positions    Consulted and Agree with Plan of Care Patient            Patient will benefit from skilled therapeutic intervention in order to improve the following deficits and impairments:  Decreased range of motion, Decreased strength, Postural dysfunction, Increased fascial restricitons, Pain  Visit Diagnosis: Cervicalgia  Stiffness of neck     Problem List Patient Active Problem List   Diagnosis Date Noted  . Esophageal dysphagia 05/04/2017  . Chest pain 09/08/2016  . Dysphagia 09/22/2015  . Exertional angina ([place]) 04/10/2015  . Abnormal nuclear cardiac imaging test 04/08/2015  . Aortic stenosis 09/24/2011  . Unstable angina ([place]) 09/09/2011  . Murmur 04/19/2011  . Coronary artery disease 12/08/2010  . Hypothyroidism 09/28/2008  . Hyperlipidemia 09/28/2008  . Essential hypertension 09/28/2008  . CAD, ARTERY BYPASS GRAFT 09/28/2008  . DEGENERATIVE JOINT DISEASE 09/28/2008  . BACK PAIN, CHRONIC 09/28/2008    3:14 PM, 11/30/19 Jerene Pitch, DPT Physical Therapy with [facility]  [phone] office  [place] [address] [place], [place], [address] Phone: [phone]   Fax:  [phone]  Name: [name] MRN: [id number] Date of Birth: 1945/12/25

## 2019-12-03 ENCOUNTER — Other Ambulatory Visit: Payer: Self-pay

## 2019-12-03 ENCOUNTER — Encounter (HOSPITAL_COMMUNITY): Payer: Self-pay | Admitting: Physical Therapy

## 2019-12-03 ENCOUNTER — Ambulatory Visit (HOSPITAL_COMMUNITY): Payer: Medicare Other | Admitting: Physical Therapy

## 2019-12-03 DIAGNOSIS — M542 Cervicalgia: Secondary | ICD-10-CM

## 2019-12-03 DIAGNOSIS — M436 Torticollis: Secondary | ICD-10-CM

## 2019-12-03 NOTE — Therapy (Signed)
[place], [place], [address] Phone: [phone]   Fax:  [phone]  Physical Therapy Treatment  Patient Details  Name: [name] MRN: [id number] Date of Birth: 1945/11/29 Referring Provider (PT): Collene Mares   Encounter Date: 12/03/2019   PT End of Session - 12/03/19 0914    Visit Number 9    Number of Visits 16    Date for PT Re-Evaluation 12/28/19    Authorization Type UHC medicare    Progress Note Due on Visit 16    PT Start Time 0915    PT Stop Time 0955    PT Time Calculation (min) 40 min    Activity Tolerance Patient tolerated treatment well    Behavior During Therapy [facility] for tasks assessed/performed           Past Medical History:  Diagnosis Date  . Anginal pain ([place])   . Arthritis    "all over"  . Bladder tumor   . Chronic lower back pain   . Coronary artery disease    a. s/p CABG x 2 (LIMA->LAD, RIMA->RCA);  b. s/p multiple PCI's to Ramus;  c. 08/2011 Cath/PCI: LM 70% into ramus with 70-80% there->treated wtih 3.5x18 Xience Xpedition DES, LCX  nonobs, RCA occluded.  RIMA & LIMA patent, EF 55-65%  . DJD (degenerative joint disease)   . GERD (gastroesophageal reflux disease)   . History of gout   . Hyperlipidemia   . Hypertension   . Hypothyroidism   . Neuropathy   . OSA on CPAP   . Prostate cancer [facility])     Past Surgical History:  Procedure Laterality Date  . ABDOMINAL HERNIA REPAIR    . BIOPSY  11/26/2015   Procedure: BIOPSY;  Surgeon: Rogene Houston, MD;  [facility];  Service: Endoscopy;;  gastric esophagus  . BLADDER TUMOR EXCISION    . BROW LIFT Bilateral 04/27/2019   Procedure: BILATERAL BLEPHAROPLASTY;  Surgeon: Baruch Goldmann, MD;  Location: [facility] ORS;  Service: Ophthalmology;  Laterality: Bilateral;  . CARDIAC CATHETERIZATION N/A 04/10/2015   Procedure: Left Heart Cath and Cors/Grafts Angiography;  Surgeon: Sherren Mocha, MD;  Location: [place] CV  LAB;  Service: Cardiovascular;  Laterality: N/A;  . CARDIAC CATHETERIZATION N/A 04/10/2015   Procedure: Coronary Stent Intervention;  Surgeon: Sherren Mocha, MD;  Location: [place] CV LAB;  Service: Cardiovascular;  Laterality: N/A;  . CATARACT EXTRACTION W/PHACO Right 06/23/2015   Procedure: CATARACT EXTRACTION PHACO AND INTRAOCULAR LENS PLACEMENT RIGHT EYE; CDE:  7.08;  Surgeon: Tonny Branch, MD;  Location: [facility] ORS;  Service: Ophthalmology;  Laterality: Right;  . CATARACT EXTRACTION W/PHACO Left 08/25/2015   Procedure: CATARACT EXTRACTION PHACO AND INTRAOCULAR LENS PLACEMENT LEFT EYE; CDE:  8.18;  Surgeon: Tonny Branch, MD;  Location: [facility] ORS;  Service: Ophthalmology;  Laterality: Left;  . COLONOSCOPY N/A 12/28/2012   Procedure: COLONOSCOPY;  Surgeon: Rogene Houston, MD;  [facility];  Service: Endoscopy;  Laterality: N/A;  830 rescheduled  . CORONARY ANGIOPLASTY WITH STENT PLACEMENT  2013; 04/10/2015   "this makes me a total of 7" (04/10/2015)  . CORONARY ARTERY BYPASS GRAFT  1997   with (LIMA)  . ESOPHAGEAL DILATION N/A 11/26/2015   Procedure: ESOPHAGEAL DILATION;  Surgeon: Rogene Houston, MD;  [facility];  Service: Endoscopy;  Laterality: N/A;  . ESOPHAGEAL DILATION N/A 05/23/2017   Procedure: ESOPHAGEAL DILATION;  Surgeon: Rogene Houston, MD;  [facility];  Service: Endoscopy;  Laterality: N/A;  . ESOPHAGEAL DILATION N/A 06/16/2017   Procedure: ESOPHAGEAL DILATION;  Surgeon: Rogene Houston, MD;  [facility];  Service: Endoscopy;  Laterality: N/A;  . ESOPHAGOGASTRODUODENOSCOPY N/A 11/26/2015   Procedure: ESOPHAGOGASTRODUODENOSCOPY (EGD);  Surgeon: Rogene Houston, MD;  [facility];  Service: Endoscopy;  Laterality: N/A;  1:25  . ESOPHAGOGASTRODUODENOSCOPY N/A 05/23/2017   Procedure: ESOPHAGOGASTRODUODENOSCOPY (EGD);  Surgeon: Rogene Houston, MD;  [facility];  Service: Endoscopy;  Laterality: N/A;  1:15  .  ESOPHAGOGASTRODUODENOSCOPY N/A 06/16/2017   Procedure: ESOPHAGOGASTRODUODENOSCOPY (EGD);  Surgeon: Rogene Houston, MD;  [facility];  Service: Endoscopy;  Laterality: N/A;  1240  . HERNIA REPAIR Left   . KNEE CARTILAGE SURGERY Bilateral   . LEFT HEART CATH AND CORS/GRAFTS ANGIOGRAPHY N/A 09/08/2016   Procedure: LEFT HEART CATH AND CORS/GRAFTS ANGIOGRAPHY;  Surgeon: Martinique, Peter M, MD;  Location: [place] CV LAB;  Service: Cardiovascular;  Laterality: N/A;  . LEFT HEART CATHETERIZATION WITH CORONARY ANGIOGRAM N/A 09/08/2011   Procedure: LEFT HEART CATHETERIZATION WITH CORONARY ANGIOGRAM;  Surgeon: Sherren Mocha, MD;  Location: [facility] CATH LAB;  Service: Cardiovascular;  Laterality: N/A;  . PERCUTANEOUS CORONARY STENT INTERVENTION (PCI-S) Right 09/08/2011   Procedure: PERCUTANEOUS CORONARY STENT INTERVENTION (PCI-S);  Surgeon: Sherren Mocha, MD;  Location: [facility] CATH LAB;  Service: Cardiovascular;  Laterality: Right;  . ROBOT ASSISTED LAPAROSCOPIC RADICAL PROSTATECTOMY      There were no vitals filed for this visit.   Subjective Assessment - 12/03/19 0919    Subjective States he is still having soreness in his neck. Reports worse is along the sides of his neck reported at 2/10. States he is doing his exercises and thinks things are starting to  loosen up.    Pertinent History stent,  HTN,    Limitations House hold activities;Lifting    Patient Stated Goals to get some exercises to get my motion back    Currently in Pain? Yes    Pain Score 2     Pain Location Neck    Pain Orientation Right;Left    Pain Descriptors / Indicators Sore    Pain Onset More than a month ago              [facility] PT Assessment - 12/03/19 0001      Assessment   Medical Diagnosis cervical pain     Referring Provider (PT) Collene Mares                         [facility] Adult PT Treatment/Exercise - 12/03/19 0001      Neck Exercises: Supine   Other Supine Exercise core activation - verbal  and tactile cues - 5 minutes    Other Supine Exercise contract/relax SB and ROT - PT performed - 8 minutes worth; scapular protraction with chin tuck 3x8 5" holds       Manual Therapy   Manual Therapy Joint mobilization;Soft tissue mobilization;Manual Traction    Manual therapy comments done seperate from all other aspects of treatment     Joint Mobilization UPA and CPA to T1-55 and to ribs 2-5 grade II/III     Soft tissue mobilization STM to rhomboids, TRAP, cervical and thoracic paraspinals            Trigger Point Dry Needling - 12/03/19 0001    Consent Given? Yes    Education Handout Provided Previously provided    Muscles Treated  Head and Neck Upper trapezius    Other Dry Needling 1 needle to left upper trap     Upper Trapezius Response Palpable increased muscle length                  PT Short Term Goals - 11/19/19 0849      PT SHORT TERM GOAL #1   Title Pt to be I in HEP to improve both cervical motion and pain    Time 2    Period Weeks    Status Achieved    Target Date 11/20/19      PT SHORT TERM GOAL #2   Title PT to be able to rotate cervical spine B to 45 degrees to improve safety while driving.    Time 2    Period Weeks    Status On-going             PT Long Term Goals - 11/30/19 1336      PT LONG TERM GOAL #1   Title PT to be I in advance HEP to improve cervical pain and ROM    Time 4    Period Weeks    Status On-going      PT LONG TERM GOAL #2   Title PT to be able to rotate cervical area to both the right and the left 60 degrees without experiencing any increased pain.    Time 4    Period Weeks    Status On-going      PT LONG TERM GOAL #3   Title PT cervical strength to 5/5 to decrease cervical pain to no greater than a 2/10 with all activities    Time 4    Period Weeks    Status On-going                 Plan - 12/03/19 0945    Clinical Impression Statement Continued with dry needling which was tolerated well. Contract  relax demonstrated improved cervical SB and ROT in supine but patient unable to replicate exercise himself. Continued with core activation exercise and improved activation with verbal and tactile cues. Fatigue noted end of session. Will continue with current POC as tolerated.    Personal Factors and Comorbidities Time since onset of injury/illness/exacerbation    Examination-Activity Limitations Carry;Reach Overhead    Examination-Participation Restrictions Driving;Meal Prep;Yard Work;Cleaning    Stability/Clinical Decision Making Evolving/Moderate complexity    Rehab Potential Good    PT Frequency 2x / week    PT Duration 4 weeks    PT Treatment/Interventions Manual techniques;Patient/family education;Passive range of motion;Dry needling;Therapeutic exercise    PT Next Visit Plan dry needling as able, posture, upper core activation  Good results with contract relax so continue with this technique.    PT Home Exercise Plan sitting scapular, cervical retraction and sidebending, 10/29 neck retraction supine, w back 11/15/19: cervical sidebend isometrics; supine neck rotation on mylar ball, self trigger point therapy, doorway pec stretch in 3 positions    Consulted and Agree with Plan of Care Patient           Patient will benefit from skilled therapeutic intervention in order to improve the following deficits and impairments:  Decreased range of motion, Decreased strength, Postural dysfunction, Increased fascial restricitons, Pain  Visit Diagnosis: Cervicalgia  Stiffness of neck     Problem List Patient Active Problem List   Diagnosis Date Noted  . Esophageal dysphagia 05/04/2017  . Chest pain 09/08/2016  . Dysphagia 09/22/2015  .  Exertional angina ([place]) 04/10/2015  . Abnormal nuclear cardiac imaging test 04/08/2015  . Aortic stenosis 09/24/2011  . Unstable angina ([place]) 09/09/2011  . Murmur 04/19/2011  . Coronary artery disease 12/08/2010  . Hypothyroidism 09/28/2008  .  Hyperlipidemia 09/28/2008  . Essential hypertension 09/28/2008  . CAD, ARTERY BYPASS GRAFT 09/28/2008  . DEGENERATIVE JOINT DISEASE 09/28/2008  . BACK PAIN, CHRONIC 09/28/2008    9:55 AM, 12/03/19 Jerene Pitch, DPT Physical Therapy with [facility]  [phone] office  [place] [address] [place], [place], [address] Phone: [phone]   Fax:  [phone]  Name: KAMAURI DENARDO MRN: [id number] Date of Birth: March 11, 1945

## 2019-12-04 ENCOUNTER — Encounter (HOSPITAL_COMMUNITY): Payer: Self-pay | Admitting: Physical Therapy

## 2019-12-04 ENCOUNTER — Ambulatory Visit (HOSPITAL_COMMUNITY): Payer: Medicare Other | Admitting: Physical Therapy

## 2019-12-04 DIAGNOSIS — M542 Cervicalgia: Secondary | ICD-10-CM

## 2019-12-04 DIAGNOSIS — M436 Torticollis: Secondary | ICD-10-CM

## 2019-12-04 NOTE — Therapy (Signed)
[place], [place], [address] Phone: [phone]   Fax:  [phone]  Physical Therapy Treatment  Patient Details  Name: [name] MRN: [id number] Date of Birth: [birth year] Referring Provider (PT): Collene Mares   Encounter Date: 12/04/2019   PT End of Session - 12/04/19 0959    Visit Number 10    Number of Visits 16    Date for PT Re-Evaluation 12/28/19    Authorization Type UHC medicare    Progress Note Due on Visit 16    PT Start Time 0950    PT Stop Time 1030    PT Time Calculation (min) 40 min    Activity Tolerance Patient tolerated treatment well    Behavior During Therapy [facility] for tasks assessed/performed           Past Medical History:  Diagnosis Date  . Anginal pain ([place])   . Arthritis    "all over"  . Bladder tumor   . Chronic lower back pain   . Coronary artery disease    a. s/p CABG x 2 (LIMA->LAD, RIMA->RCA);  b. s/p multiple PCI's to Ramus;  c. 08/2011 Cath/PCI: LM 70% into ramus with 70-80% there->treated wtih 3.5x18 Xience Xpedition DES, LCX  nonobs, RCA occluded.  RIMA & LIMA patent, EF 55-65%  . DJD (degenerative joint disease)   . GERD (gastroesophageal reflux disease)   . History of gout   . Hyperlipidemia   . Hypertension   . Hypothyroidism   . Neuropathy   . OSA on CPAP   . Prostate cancer [facility]     Past Surgical History:  Procedure Laterality Date  . ABDOMINAL HERNIA REPAIR    . BIOPSY  11/26/2015   Procedure: BIOPSY;  Surgeon: Rogene Houston, MD;  [facility];  Service: Endoscopy;;  gastric esophagus  . BLADDER TUMOR EXCISION    . BROW LIFT Bilateral 04/27/2019   Procedure: BILATERAL BLEPHAROPLASTY;  Surgeon: Baruch Goldmann, MD;  Location: [facility];  Service: Ophthalmology;  Laterality: Bilateral;  . CARDIAC CATHETERIZATION N/A 04/10/2015   Procedure: Left Heart Cath and Cors/Grafts Angiography;  Surgeon: Sherren Mocha, MD;  Location: [place] CV  LAB;  Service: Cardiovascular;  Laterality: N/A;  . CARDIAC CATHETERIZATION N/A 04/10/2015   Procedure: Coronary Stent Intervention;  Surgeon: Sherren Mocha, MD;  Location: [place] CV LAB;  Service: Cardiovascular;  Laterality: N/A;  . CATARACT EXTRACTION W/PHACO Right 06/23/2015   Procedure: CATARACT EXTRACTION PHACO AND INTRAOCULAR LENS PLACEMENT RIGHT EYE; CDE:  7.08;  Surgeon: Tonny Branch, MD;  Location: [facility];  Service: Ophthalmology;  Laterality: Right;  . CATARACT EXTRACTION W/PHACO Left 08/25/2015   Procedure: CATARACT EXTRACTION PHACO AND INTRAOCULAR LENS PLACEMENT LEFT EYE; CDE:  8.18;  Surgeon: Tonny Branch, MD;  Location: [facility];  Service: Ophthalmology;  Laterality: Left;  . COLONOSCOPY N/A 12/28/2012   Procedure: COLONOSCOPY;  Surgeon: Rogene Houston, MD;  [facility];  Service: Endoscopy;  Laterality: N/A;  830 rescheduled  . CORONARY ANGIOPLASTY WITH STENT PLACEMENT  2013; 04/10/2015   "this makes me a total of 7" (04/10/2015)  . CORONARY ARTERY BYPASS GRAFT  1997   with (LIMA)  . ESOPHAGEAL DILATION N/A 11/26/2015   Procedure: ESOPHAGEAL DILATION;  Surgeon: Rogene Houston, MD;  [facility];  Service: Endoscopy;  Laterality: N/A;  . ESOPHAGEAL DILATION N/A 05/23/2017   Procedure: ESOPHAGEAL DILATION;  Surgeon: Rogene Houston, MD;  [facility];  Service: Endoscopy;  Laterality: N/A;  . ESOPHAGEAL DILATION N/A 06/16/2017   Procedure: ESOPHAGEAL DILATION;  Surgeon: Rogene Houston, MD;  [facility];  Service: Endoscopy;  Laterality: N/A;  . ESOPHAGOGASTRODUODENOSCOPY N/A 11/26/2015   Procedure: ESOPHAGOGASTRODUODENOSCOPY (EGD);  Surgeon: Rogene Houston, MD;  [facility];  Service: Endoscopy;  Laterality: N/A;  1:25  . ESOPHAGOGASTRODUODENOSCOPY N/A 05/23/2017   Procedure: ESOPHAGOGASTRODUODENOSCOPY (EGD);  Surgeon: Rogene Houston, MD;  [facility];  Service: Endoscopy;  Laterality: N/A;  1:15  .  ESOPHAGOGASTRODUODENOSCOPY N/A 06/16/2017   Procedure: ESOPHAGOGASTRODUODENOSCOPY (EGD);  Surgeon: Rogene Houston, MD;  [facility];  Service: Endoscopy;  Laterality: N/A;  1240  . HERNIA REPAIR Left   . KNEE CARTILAGE SURGERY Bilateral   . LEFT HEART CATH AND CORS/GRAFTS ANGIOGRAPHY N/A 09/08/2016   Procedure: LEFT HEART CATH AND CORS/GRAFTS ANGIOGRAPHY;  Surgeon: Martinique, Peter M, MD;  Location: [place] CV LAB;  Service: Cardiovascular;  Laterality: N/A;  . LEFT HEART CATHETERIZATION WITH CORONARY ANGIOGRAM N/A 09/08/2011   Procedure: LEFT HEART CATHETERIZATION WITH CORONARY ANGIOGRAM;  Surgeon: Sherren Mocha, MD;  Location: [facility] CATH LAB;  Service: Cardiovascular;  Laterality: N/A;  . PERCUTANEOUS CORONARY STENT INTERVENTION (PCI-S) Right 09/08/2011   Procedure: PERCUTANEOUS CORONARY STENT INTERVENTION (PCI-S);  Surgeon: Sherren Mocha, MD;  Location: [facility] CATH LAB;  Service: Cardiovascular;  Laterality: Right;  . ROBOT ASSISTED LAPAROSCOPIC RADICAL PROSTATECTOMY      There were no vitals filed for this visit.   Subjective Assessment - 12/04/19 0957    Subjective Patient says he feels better. Still stiff but can tell mobility has improved.    Pertinent History stent,  HTN,    Limitations House hold activities;Lifting    Patient Stated Goals to get some exercises to get my motion back    Currently in Pain? Yes    Pain Score 2     Pain Location Neck    Pain Orientation Right;Left    Pain Descriptors / Indicators Tightness;Sore    Pain Type Chronic pain    Pain Onset More than a month ago    Pain Frequency Constant    Aggravating Factors  turning, looking up    Effect of Pain on Daily Activities Limit                             [facility] Adult PT Treatment/Exercise - 12/04/19 0001      Neck Exercises: Standing   Other Standing Exercises GTB rows x 20, GTB shoulder extension x20       Neck Exercises: Seated   Cervical Isometrics Right lateral flexion;Left  lateral flexion;Right rotation;Left rotation;5 secs;5 reps    Other Seated Exercise 3D thoracic excursions x 5 each       Manual Therapy   Manual Therapy Soft tissue mobilization    Manual therapy comments done seperate from all other aspects of treatment     Soft tissue mobilization IASTM to bialateral upper trap, levator, and cervical paraspinals with patient seated       Neck Exercises: Stretches   Upper Trapezius Stretch Right;Left;3 reps;30 seconds    Levator Stretch Right;Left;3 reps;30 seconds                    PT Short Term Goals - 11/19/19 0849      PT SHORT TERM GOAL #1   Title Pt to be I in HEP to improve  both cervical motion and pain    Time 2    Period Weeks    Status Achieved    Target Date 11/20/19      PT SHORT TERM GOAL #2   Title PT to be able to rotate cervical spine B to 45 degrees to improve safety while driving.    Time 2    Period Weeks    Status On-going             PT Long Term Goals - 11/30/19 1336      PT LONG TERM GOAL #1   Title PT to be I in advance HEP to improve cervical pain and ROM    Time 4    Period Weeks    Status On-going      PT LONG TERM GOAL #2   Title PT to be able to rotate cervical area to both the right and the left 60 degrees without experiencing any increased pain.    Time 4    Period Weeks    Status On-going      PT LONG TERM GOAL #3   Title PT cervical strength to 5/5 to decrease cervical pain to no greater than a 2/10 with all activities    Time 4    Period Weeks    Status On-going                 Plan - 12/04/19 1338    Clinical Impression Statement Patient tolerated session well. Shows good progress toward AROM goals. Added cervical rotation isometric, patient educated on proper form and function. Also added thoracic 3D thoracic excursions, patient noting slight discomfort in RT shoulder that subsides with rest. Patient continues to have palpable restriction in bilateral trapezius are.  Addressed this with IASTM manual. Patient noted improved mobility and decreased stiffness post manual. Patient will continue to benefit from skilled therapy to progress postural strength and cervical mobility to reduce pain and improve LOF with ADLs.    Personal Factors and Comorbidities Time since onset of injury/illness/exacerbation    Examination-Activity Limitations Carry;Reach Overhead    Examination-Participation Restrictions Driving;Meal Prep;Yard Work;Cleaning    Stability/Clinical Decision Making Evolving/Moderate complexity    Rehab Potential Good    PT Frequency 2x / week    PT Duration 4 weeks    PT Treatment/Interventions Manual techniques;Patient/family education;Passive range of motion;Dry needling;Therapeutic exercise    PT Next Visit Plan dry needling as able, posture, upper core activation  Good results with contract relax so continue with this technique.    PT Home Exercise Plan sitting scapular, cervical retraction and sidebending, 10/29 neck retraction supine, w back 11/15/19: cervical sidebend isometrics; supine neck rotation on mylar ball, self trigger point therapy, doorway pec stretch in 3 positions    Consulted and Agree with Plan of Care Patient           Patient will benefit from skilled therapeutic intervention in order to improve the following deficits and impairments:  Decreased range of motion, Decreased strength, Postural dysfunction, Increased fascial restricitons, Pain  Visit Diagnosis: Cervicalgia  Stiffness of neck     Problem List Patient Active Problem List   Diagnosis Date Noted  . Esophageal dysphagia 05/04/2017  . Chest pain 09/08/2016  . Dysphagia 09/22/2015  . Exertional angina ([place]) 04/10/2015  . Abnormal nuclear cardiac imaging test 04/08/2015  . Aortic stenosis 09/24/2011  . Unstable angina ([place]) 09/09/2011  . Murmur 04/19/2011  . Coronary artery disease 12/08/2010  . Hypothyroidism 09/28/2008  .  Hyperlipidemia 09/28/2008  .  Essential hypertension 09/28/2008  . CAD, ARTERY BYPASS GRAFT 09/28/2008  . DEGENERATIVE JOINT DISEASE 09/28/2008  . BACK PAIN, CHRONIC 09/28/2008   1:42 PM, 12/04/19 Josue Hector PT DPT  Physical Therapist with [facility]  [phone] [place] [address] [place], [place], [address] Phone: [phone]   Fax:  [phone]  Name: [name] MRN: [id number] Date of Birth: 1945-10-18

## 2019-12-05 ENCOUNTER — Encounter (HOSPITAL_COMMUNITY): Payer: Medicare Other | Admitting: Physical Therapy

## 2019-12-11 ENCOUNTER — Other Ambulatory Visit: Payer: Self-pay

## 2019-12-11 ENCOUNTER — Encounter (HOSPITAL_COMMUNITY): Payer: Self-pay | Admitting: Physical Therapy

## 2019-12-11 ENCOUNTER — Ambulatory Visit (HOSPITAL_COMMUNITY): Payer: Medicare Other | Admitting: Physical Therapy

## 2019-12-11 DIAGNOSIS — M542 Cervicalgia: Secondary | ICD-10-CM | POA: Diagnosis not present

## 2019-12-11 DIAGNOSIS — M436 Torticollis: Secondary | ICD-10-CM | POA: Diagnosis not present

## 2019-12-11 NOTE — Therapy (Signed)
[place], [place], [address] Phone: [phone]   Fax:  [phone]  Physical Therapy Treatment  Patient Details  Name: [name] MRN: [id number] Date of Birth: 1945/08/25 Referring Provider (PT): Collene Mares   Encounter Date: 12/11/2019   PT End of Session - 12/11/19 1047    Visit Number 11    Number of Visits 16    Date for PT Re-Evaluation 12/28/19    Authorization Type UHC medicare    Progress Note Due on Visit 16    PT Start Time 1040    PT Stop Time 1125    PT Time Calculation (min) 45 min    Activity Tolerance Patient tolerated treatment well    Behavior During Therapy [facility] for tasks assessed/performed           Past Medical History:  Diagnosis Date   Anginal pain ([place])    Arthritis    "all over"   Bladder tumor    Chronic lower back pain    Coronary artery disease    a. s/p CABG x 2 (LIMA->LAD, RIMA->RCA);  b. s/p multiple PCI's to Ramus;  c. 08/2011 Cath/PCI: LM 70% into ramus with 70-80% there->treated wtih 3.5x18 Xience Xpedition DES, LCX  nonobs, RCA occluded.  RIMA & LIMA patent, EF 55-65%   DJD (degenerative joint disease)    GERD (gastroesophageal reflux disease)    History of gout    Hyperlipidemia    Hypertension    Hypothyroidism    Neuropathy    OSA on CPAP    Prostate cancer [facility])     Past Surgical History:  Procedure Laterality Date   ABDOMINAL HERNIA REPAIR     BIOPSY  11/26/2015   Procedure: BIOPSY;  Surgeon: Rogene Houston, MD;  [facility];  Service: Endoscopy;;  gastric esophagus   BLADDER TUMOR EXCISION     BROW LIFT Bilateral 04/27/2019   Procedure: BILATERAL BLEPHAROPLASTY;  Surgeon: Baruch Goldmann, MD;  Location: [facility] ORS;  Service: Ophthalmology;  Laterality: Bilateral;   CARDIAC CATHETERIZATION N/A 04/10/2015   Procedure: Left Heart Cath and Cors/Grafts Angiography;  Surgeon: Sherren Mocha, MD;  Location: [place] CV  LAB;  Service: Cardiovascular;  Laterality: N/A;   CARDIAC CATHETERIZATION N/A 04/10/2015   Procedure: Coronary Stent Intervention;  Surgeon: Sherren Mocha, MD;  Location: [place] CV LAB;  Service: Cardiovascular;  Laterality: N/A;   CATARACT EXTRACTION W/PHACO Right 06/23/2015   Procedure: CATARACT EXTRACTION PHACO AND INTRAOCULAR LENS PLACEMENT RIGHT EYE; CDE:  7.08;  Surgeon: Tonny Branch, MD;  Location: [facility] ORS;  Service: Ophthalmology;  Laterality: Right;   CATARACT EXTRACTION W/PHACO Left 08/25/2015   Procedure: CATARACT EXTRACTION PHACO AND INTRAOCULAR LENS PLACEMENT LEFT EYE; CDE:  8.18;  Surgeon: Tonny Branch, MD;  Location: [facility] ORS;  Service: Ophthalmology;  Laterality: Left;   COLONOSCOPY N/A 12/28/2012   Procedure: COLONOSCOPY;  Surgeon: Rogene Houston, MD;  [facility];  Service: Endoscopy;  Laterality: N/A;  830 rescheduled   CORONARY ANGIOPLASTY WITH STENT PLACEMENT  2013; 04/10/2015   "this makes me a total of 7" (04/10/2015)   CORONARY ARTERY BYPASS GRAFT  1997   with (LIMA)   ESOPHAGEAL DILATION N/A 11/26/2015   Procedure: ESOPHAGEAL DILATION;  Surgeon: Rogene Houston, MD;  [facility];  Service: Endoscopy;  Laterality: N/A;   ESOPHAGEAL DILATION N/A 05/23/2017   Procedure: ESOPHAGEAL DILATION;  Surgeon: Rogene Houston, MD;  [facility];  Service: Endoscopy;  Laterality: N/A;   ESOPHAGEAL DILATION N/A 06/16/2017   Procedure: ESOPHAGEAL DILATION;  Surgeon: Rogene Houston, MD;  [facility];  Service: Endoscopy;  Laterality: N/A;   ESOPHAGOGASTRODUODENOSCOPY N/A 11/26/2015   Procedure: ESOPHAGOGASTRODUODENOSCOPY (EGD);  Surgeon: Rogene Houston, MD;  [facility];  Service: Endoscopy;  Laterality: N/A;  1:25   ESOPHAGOGASTRODUODENOSCOPY N/A 05/23/2017   Procedure: ESOPHAGOGASTRODUODENOSCOPY (EGD);  Surgeon: Rogene Houston, MD;  [facility];  Service: Endoscopy;  Laterality: N/A;  1:15    ESOPHAGOGASTRODUODENOSCOPY N/A 06/16/2017   Procedure: ESOPHAGOGASTRODUODENOSCOPY (EGD);  Surgeon: Rogene Houston, MD;  [facility];  Service: Endoscopy;  Laterality: N/A;  1240   HERNIA REPAIR Left    KNEE CARTILAGE SURGERY Bilateral    LEFT HEART CATH AND CORS/GRAFTS ANGIOGRAPHY N/A 09/08/2016   Procedure: LEFT HEART CATH AND CORS/GRAFTS ANGIOGRAPHY;  Surgeon: Martinique, Peter M, MD;  Location: [place] CV LAB;  Service: Cardiovascular;  Laterality: N/A;   LEFT HEART CATHETERIZATION WITH CORONARY ANGIOGRAM N/A 09/08/2011   Procedure: LEFT HEART CATHETERIZATION WITH CORONARY ANGIOGRAM;  Surgeon: Sherren Mocha, MD;  Location: [facility] CATH LAB;  Service: Cardiovascular;  Laterality: N/A;   PERCUTANEOUS CORONARY STENT INTERVENTION (PCI-S) Right 09/08/2011   Procedure: PERCUTANEOUS CORONARY STENT INTERVENTION (PCI-S);  Surgeon: Sherren Mocha, MD;  Location: [facility] CATH LAB;  Service: Cardiovascular;  Laterality: Right;   ROBOT ASSISTED LAPAROSCOPIC RADICAL PROSTATECTOMY      There were no vitals filed for this visit.   Subjective Assessment - 12/11/19 1046    Subjective Patient says his neck feels about 50% better.    Pertinent History stent,  HTN,    Limitations House hold activities;Lifting    Patient Stated Goals to get some exercises to get my motion back    Currently in Pain? Yes    Pain Score 2     Pain Location Neck    Pain Descriptors / Indicators Tightness    Pain Type Chronic pain    Pain Onset More than a month ago    Pain Frequency Constant                             [facility] Adult PT Treatment/Exercise - 12/11/19 0001      Neck Exercises: Standing   Other Standing Exercises BTB rows x 20, BTB shoulder extension x20     Other Standing Exercises standing shoulder flexion with scapular retraction x20      Neck Exercises: Seated   Cervical Isometrics Right lateral flexion;Left lateral flexion;Right rotation;Left rotation;5 secs;5 reps    Neck  Retraction 10 reps;5 secs      Manual Therapy   Manual Therapy Soft tissue mobilization    Manual therapy comments done seperate from all other aspects of treatment     Soft tissue mobilization IASTM to bialateral upper trap, levator, and cervical paraspinals with patient seated       Neck Exercises: Stretches   Upper Trapezius Stretch Right;Left;3 reps;30 seconds    Levator Stretch Right;Left;3 reps;30 seconds                    PT Short Term Goals - 11/19/19 0849      PT SHORT TERM GOAL #1   Title Pt to be I in HEP to improve both cervical motion and pain    Time 2    Period Weeks    Status Achieved  Target Date 11/20/19      PT SHORT TERM GOAL #2   Title PT to be able to rotate cervical spine B to 45 degrees to improve safety while driving.    Time 2    Period Weeks    Status On-going             PT Long Term Goals - 11/30/19 1336      PT LONG TERM GOAL #1   Title PT to be I in advance HEP to improve cervical pain and ROM    Time 4    Period Weeks    Status On-going      PT LONG TERM GOAL #2   Title PT to be able to rotate cervical area to both the right and the left 60 degrees without experiencing any increased pain.    Time 4    Period Weeks    Status On-going      PT LONG TERM GOAL #3   Title PT cervical strength to 5/5 to decrease cervical pain to no greater than a 2/10 with all activities    Time 4    Period Weeks    Status On-going                 Plan - 12/11/19 1138    Clinical Impression Statement Patient tolerated ther ex progressions well with no complaints of pain. Patient making good progress with cervical AROM. Increased band resistance to blue. Added standing shoulder flexion with scapular retraction. Patient educated on proper form and function of all added exercises. Patient reports decreased stiffness and improved cervical mobility post manual treatment. Patient will continue to benefit from skilled therapy services to  progress postural strength and cervical mobility to reduce pain and improve LOF with ADLs.    Personal Factors and Comorbidities Time since onset of injury/illness/exacerbation    Examination-Activity Limitations Carry;Reach Overhead    Examination-Participation Restrictions Driving;Meal Prep;Yard Work;Cleaning    Stability/Clinical Decision Making Evolving/Moderate complexity    Rehab Potential Good    PT Frequency 2x / week    PT Duration 4 weeks    PT Treatment/Interventions Manual techniques;Patient/family education;Passive range of motion;Dry needling;Therapeutic exercise    PT Next Visit Plan dry needling as able, posture, upper core activation  Good results with contract relax so continue with this technique.    PT Home Exercise Plan sitting scapular, cervical retraction and sidebending, 10/29 neck retraction supine, w back 11/15/19: cervical sidebend isometrics; supine neck rotation on mylar ball, self trigger point therapy, doorway pec stretch in 3 positions    Consulted and Agree with Plan of Care Patient           Patient will benefit from skilled therapeutic intervention in order to improve the following deficits and impairments:  Decreased range of motion, Decreased strength, Postural dysfunction, Increased fascial restricitons, Pain  Visit Diagnosis: Cervicalgia  Stiffness of neck     Problem List Patient Active Problem List   Diagnosis Date Noted   Esophageal dysphagia 05/04/2017   Chest pain 09/08/2016   Dysphagia 09/22/2015   Exertional angina ([place]) 04/10/2015   Abnormal nuclear cardiac imaging test 04/08/2015   Aortic stenosis 09/24/2011   Unstable angina ([place]) 09/09/2011   Murmur 04/19/2011   Coronary artery disease 12/08/2010   Hypothyroidism 09/28/2008   Hyperlipidemia 09/28/2008   Essential hypertension 09/28/2008   CAD, ARTERY BYPASS GRAFT 09/28/2008   DEGENERATIVE JOINT DISEASE 09/28/2008   BACK PAIN, CHRONIC 09/28/2008    11:48 AM,  12/11/19 Josue Hector PT DPT  Physical Therapist with [facility]  [phone] [place] [address] [place], [place], [address] Phone: [phone]   Fax:  [phone]  Name: [name] MRN: [id number] Date of Birth: 09-23-1945

## 2019-12-13 ENCOUNTER — Encounter (HOSPITAL_COMMUNITY): Payer: Medicare Other | Admitting: Physical Therapy

## 2019-12-18 ENCOUNTER — Other Ambulatory Visit: Payer: Self-pay

## 2019-12-18 ENCOUNTER — Ambulatory Visit (HOSPITAL_COMMUNITY): Payer: Medicare Other | Attending: Physician Assistant | Admitting: Physical Therapy

## 2019-12-18 ENCOUNTER — Encounter (HOSPITAL_COMMUNITY): Payer: Self-pay | Admitting: Physical Therapy

## 2019-12-18 ENCOUNTER — Encounter (HOSPITAL_COMMUNITY): Payer: Medicare Other | Admitting: Physical Therapy

## 2019-12-18 DIAGNOSIS — M542 Cervicalgia: Secondary | ICD-10-CM | POA: Insufficient documentation

## 2019-12-18 DIAGNOSIS — M436 Torticollis: Secondary | ICD-10-CM | POA: Insufficient documentation

## 2019-12-18 NOTE — Therapy (Signed)
[place], [place], [address] Phone: [phone]   Fax:  [phone]  Physical Therapy Treatment  Patient Details  Name: [name] MRN: [id number] Date of Birth: June 13, 1945 Referring Provider (PT): Collene Mares   Encounter Date: 12/18/2019   PT End of Session - 12/18/19 0951    Visit Number 12    Number of Visits 16    Date for PT Re-Evaluation 12/28/19    Authorization Type UHC medicare    Progress Note Due on Visit 16    PT Start Time [phone]    PT Stop Time 1035    PT Time Calculation (min) 49 min    Activity Tolerance Patient tolerated treatment well    Behavior During Therapy [facility] for tasks assessed/performed           Past Medical History:  Diagnosis Date  . Anginal pain ([place])   . Arthritis    "all over"  . Bladder tumor   . Chronic lower back pain   . Coronary artery disease    a. s/p CABG x 2 (LIMA->LAD, RIMA->RCA);  b. s/p multiple PCI's to Ramus;  c. 08/2011 Cath/PCI: LM 70% into ramus with 70-80% there->treated wtih 3.5x18 Xience Xpedition DES, LCX  nonobs, RCA occluded.  RIMA & LIMA patent, EF 55-65%  . DJD (degenerative joint disease)   . GERD (gastroesophageal reflux disease)   . History of gout   . Hyperlipidemia   . Hypertension   . Hypothyroidism   . Neuropathy   . OSA on CPAP   . Prostate cancer [facility])     Past Surgical History:  Procedure Laterality Date  . ABDOMINAL HERNIA REPAIR    . BIOPSY  11/26/2015   Procedure: BIOPSY;  Surgeon: Rogene Houston, MD;  [facility];  Service: Endoscopy;;  gastric esophagus  . BLADDER TUMOR EXCISION    . BROW LIFT Bilateral 04/27/2019   Procedure: BILATERAL BLEPHAROPLASTY;  Surgeon: Baruch Goldmann, MD;  Location: [facility];  Service: Ophthalmology;  Laterality: Bilateral;  . CARDIAC CATHETERIZATION N/A 04/10/2015   Procedure: Left Heart Cath and Cors/Grafts Angiography;  Surgeon: Sherren Mocha, MD;  Location: [place] CV  LAB;  Service: Cardiovascular;  Laterality: N/A;  . CARDIAC CATHETERIZATION N/A 04/10/2015   Procedure: Coronary Stent Intervention;  Surgeon: Sherren Mocha, MD;  Location: [place] CV LAB;  Service: Cardiovascular;  Laterality: N/A;  . CATARACT EXTRACTION W/PHACO Right 06/23/2015   Procedure: CATARACT EXTRACTION PHACO AND INTRAOCULAR LENS PLACEMENT RIGHT EYE; CDE:  7.08;  Surgeon: Tonny Branch, MD;  Location: [facility];  Service: Ophthalmology;  Laterality: Right;  . CATARACT EXTRACTION W/PHACO Left 08/25/2015   Procedure: CATARACT EXTRACTION PHACO AND INTRAOCULAR LENS PLACEMENT LEFT EYE; CDE:  8.18;  Surgeon: Tonny Branch, MD;  Location: [facility] OR[facility];  Service: Ophthalmology;  Laterality: Left;  . COLONOSCOPY N/A 12/28/2012   Procedure: COLONOSCOPY;  Surgeon: Rogene Houston, MD;  [facility];  Service: Endoscopy;  Laterality: N/A;  830 rescheduled  . CORONARY ANGIOPLASTY WITH STENT PLACEMENT  2013; 04/10/2015   "this makes me a total of 7" (04/10/2015)  . CORONARY ARTERY BYPASS GRAFT  1997   with (LIMA)  . ESOPHAGEAL DILATION N/A 11/26/2015   Procedure: ESOPHAGEAL DILATION;  Surgeon: Rogene Houston, MD;  [facility];  Service: Endoscopy;  Laterality: N/A;  . ESOPHAGEAL DILATION N/A 05/23/2017   Procedure: ESOPHAGEAL DILATION;  Surgeon: Rogene Houston, MD;  [facility];  Service: Endoscopy;  Laterality: N/A;  . ESOPHAGEAL DILATION N/A 06/16/2017   Procedure: ESOPHAGEAL DILATION;  Surgeon: Rogene Houston, MD;  [facility];  Service: Endoscopy;  Laterality: N/A;  . ESOPHAGOGASTRODUODENOSCOPY N/A 11/26/2015   Procedure: ESOPHAGOGASTRODUODENOSCOPY (EGD);  Surgeon: Rogene Houston, MD;  [facility];  Service: Endoscopy;  Laterality: N/A;  1:25  . ESOPHAGOGASTRODUODENOSCOPY N/A 05/23/2017   Procedure: ESOPHAGOGASTRODUODENOSCOPY (EGD);  Surgeon: Rogene Houston, MD;  [facility];  Service: Endoscopy;  Laterality: N/A;  1:15  .  ESOPHAGOGASTRODUODENOSCOPY N/A 06/16/2017   Procedure: ESOPHAGOGASTRODUODENOSCOPY (EGD);  Surgeon: Rogene Houston, MD;  [facility];  Service: Endoscopy;  Laterality: N/A;  1240  . HERNIA REPAIR Left   . KNEE CARTILAGE SURGERY Bilateral   . LEFT HEART CATH AND CORS/GRAFTS ANGIOGRAPHY N/A 09/08/2016   Procedure: LEFT HEART CATH AND CORS/GRAFTS ANGIOGRAPHY;  Surgeon: Martinique, Peter M, MD;  Location: [place] CV LAB;  Service: Cardiovascular;  Laterality: N/A;  . LEFT HEART CATHETERIZATION WITH CORONARY ANGIOGRAM N/A 09/08/2011   Procedure: LEFT HEART CATHETERIZATION WITH CORONARY ANGIOGRAM;  Surgeon: Sherren Mocha, MD;  Location: [facility] CATH LAB;  Service: Cardiovascular;  Laterality: N/A;  . PERCUTANEOUS CORONARY STENT INTERVENTION (PCI-S) Right 09/08/2011   Procedure: PERCUTANEOUS CORONARY STENT INTERVENTION (PCI-S);  Surgeon: Sherren Mocha, MD;  Location: [facility] CATH LAB;  Service: Cardiovascular;  Laterality: Right;  . ROBOT ASSISTED LAPAROSCOPIC RADICAL PROSTATECTOMY      There were no vitals filed for this visit.   Subjective Assessment - 12/18/19 0957    Subjective Patient says he feels a lot better. Still has some stiffness and soreness    Pertinent History stent,  HTN,    Limitations House hold activities;Lifting    Patient Stated Goals to get some exercises to get my motion back    Currently in Pain? Yes    Pain Score 3     Pain Location Neck    Pain Orientation Posterior    Pain Descriptors / Indicators Sore;Tightness    Pain Type Chronic pain    Pain Onset More than a month ago                             [facility] Adult PT Treatment/Exercise - 12/18/19 0001      Neck Exercises: Standing   Other Standing Exercises BTB rows x 20, BTB shoulder extension x20     Other Standing Exercises standing shoulder flexion and scaption with 3# x15 each       Neck Exercises: Seated   Neck Retraction 10 reps;5 secs      Neck Exercises: Supine   Cervical Isometrics  Right lateral flexion;Left lateral flexion;5 secs;5 reps      Manual Therapy   Manual Therapy Soft tissue mobilization    Manual therapy comments done seperate from all other aspects of treatment     Soft tissue mobilization STM to bilateral cervical paraspinals and LT upper trap with trigger point identification, area preparation including positioning and cleaning, STM post dry needling to target muscles       Neck Exercises: Stretches   Upper Trapezius Stretch Right;Left;2 reps;30 seconds    Levator Stretch Right;Left;2 reps;30 seconds            Trigger Point Dry Needling - 12/18/19 0001    Consent Given? Yes    Education Handout Provided Previously provided    Muscles Treated Head and Neck Cervical multifidi;Upper  trapezius    Other Dry Needling 1 needle to LT cervical multifidi about C4 and C7, RT about C3; 1 needle to LT upper trapezius with pistoning, patient in prone for all, tolerated well     Upper Trapezius Response Twitch reponse elicited;Palpable increased muscle length                  PT Short Term Goals - 11/19/19 0849      PT SHORT TERM GOAL #1   Title Pt to be I in HEP to improve both cervical motion and pain    Time 2    Period Weeks    Status Achieved    Target Date 11/20/19      PT SHORT TERM GOAL #2   Title PT to be able to rotate cervical spine B to 45 degrees to improve safety while driving.    Time 2    Period Weeks    Status On-going             PT Long Term Goals - 11/30/19 1336      PT LONG TERM GOAL #1   Title PT to be I in advance HEP to improve cervical pain and ROM    Time 4    Period Weeks    Status On-going      PT LONG TERM GOAL #2   Title PT to be able to rotate cervical area to both the right and the left 60 degrees without experiencing any increased pain.    Time 4    Period Weeks    Status On-going      PT LONG TERM GOAL #3   Title PT cervical strength to 5/5 to decrease cervical pain to no greater than a 2/10  with all activities    Time 4    Period Weeks    Status On-going                 Plan - 12/18/19 1209    Clinical Impression Statement Patient tolerated session well today. Shows good return with previous ther ex. Progressed to standing shoulder flexion and scaption with 3 lb dumbbells. Issued patient green band for HEP progressions to include band rows and shoulder extensions. Performed trigger point dry needling to cervical multifidus and LT upper trap. Patient tolerated well and notes decreased "aggravating/ nagging pain" in neck post treatment to 0/10.    Personal Factors and Comorbidities Time since onset of injury/illness/exacerbation    Examination-Activity Limitations Carry;Reach Overhead    Examination-Participation Restrictions Driving;Meal Prep;Yard Work;Cleaning    Stability/Clinical Decision Making Evolving/Moderate complexity    Rehab Potential Good    PT Frequency 2x / week    PT Duration 4 weeks    PT Treatment/Interventions Manual techniques;Patient/family education;Passive range of motion;Dry needling;Therapeutic exercise    PT Next Visit Plan Assess response to dry needling last session. Conitine postural strength progressions as tolerated. Manual STM and dry needling as indicated.    PT Home Exercise Plan sitting scapular, cervical retraction and sidebending, 10/29 neck retraction supine, w back 11/15/19: cervical sidebend isometrics; supine neck rotation on mylar ball, self trigger point therapy, doorway pec stretch in 3 positions 12/18/19 band rows and extensions    Consulted and Agree with Plan of Care Patient           Patient will benefit from skilled therapeutic intervention in order to improve the following deficits and impairments:  Decreased range of motion, Decreased strength, Postural dysfunction, Increased fascial restricitons,  Pain  Visit Diagnosis: Cervicalgia  Stiffness of neck     Problem List Patient Active Problem List   Diagnosis Date  Noted  . Esophageal dysphagia 05/04/2017  . Chest pain 09/08/2016  . Dysphagia 09/22/2015  . Exertional angina ([place]) 04/10/2015  . Abnormal nuclear cardiac imaging test 04/08/2015  . Aortic stenosis 09/24/2011  . Unstable angina ([place]) 09/09/2011  . Murmur 04/19/2011  . Coronary artery disease 12/08/2010  . Hypothyroidism 09/28/2008  . Hyperlipidemia 09/28/2008  . Essential hypertension 09/28/2008  . CAD, ARTERY BYPASS GRAFT 09/28/2008  . DEGENERATIVE JOINT DISEASE 09/28/2008  . BACK PAIN, CHRONIC 09/28/2008    12:14 PM, 12/18/19 Josue Hector PT DPT  Physical Therapist with [facility]  [phone] [place] [address] [place], [place], [address] Phone: [phone]   Fax:  [phone]  Name: [name] MRN: [id number] Date of Birth: 1945/02/04

## 2019-12-20 ENCOUNTER — Other Ambulatory Visit: Payer: Self-pay

## 2019-12-20 ENCOUNTER — Ambulatory Visit (HOSPITAL_COMMUNITY): Payer: Medicare Other | Admitting: Physical Therapy

## 2019-12-20 ENCOUNTER — Encounter (HOSPITAL_COMMUNITY): Payer: Self-pay | Admitting: Physical Therapy

## 2019-12-20 DIAGNOSIS — M436 Torticollis: Secondary | ICD-10-CM

## 2019-12-20 DIAGNOSIS — M542 Cervicalgia: Secondary | ICD-10-CM | POA: Diagnosis not present

## 2019-12-20 NOTE — Patient Instructions (Signed)
Access Code: [email or web address] URL: [email or web address] Date: 12/20/2019 Prepared by: Sherlyn Lees  Exercises Mid-Lower Cervical Extension SNAG with Strap - 1 x daily - 7 x weekly - 3 sets - 10 reps Seated Assisted Cervical Rotation with Towel - 1 x daily - 7 x weekly - 3 sets - 10 reps Supine Deep Neck Flexor Training - Repetitions - 1 x daily - 7 x weekly - 5 sets - 5 reps - 5 hold

## 2019-12-20 NOTE — Therapy (Signed)
[place], [place], [address] Phone: [phone]   Fax:  [phone]  Physical Therapy Treatment  Patient Details  Name: [name] MRN: [id number] Date of Birth: [birth year] Referring Provider (PT): Collene Mares   Encounter Date: 12/20/2019   PT End of Session - 12/20/19 1002    Visit Number 13    Number of Visits 16    Date for PT Re-Evaluation 12/28/19    Authorization Type UHC medicare    Progress Note Due on Visit 16    PT Start Time 1001    PT Stop Time 1044    PT Time Calculation (min) 43 min    Activity Tolerance Patient tolerated treatment well    Behavior During Therapy [facility] for tasks assessed/performed           Past Medical History:  Diagnosis Date  . Anginal pain ([place])   . Arthritis    "all over"  . Bladder tumor   . Chronic lower back pain   . Coronary artery disease    a. s/p CABG x 2 (LIMA->LAD, RIMA->RCA);  b. s/p multiple PCI's to Ramus;  c. 08/2011 Cath/PCI: LM 70% into ramus with 70-80% there->treated wtih 3.5x18 Xience Xpedition DES, LCX  nonobs, RCA occluded.  RIMA & LIMA patent, EF 55-65%  . DJD (degenerative joint disease)   . GERD (gastroesophageal reflux disease)   . History of gout   . Hyperlipidemia   . Hypertension   . Hypothyroidism   . Neuropathy   . OSA on CPAP   . Prostate cancer [facility])     Past Surgical History:  Procedure Laterality Date  . ABDOMINAL HERNIA REPAIR    . BIOPSY  11/26/2015   Procedure: BIOPSY;  Surgeon: Rogene Houston, MD;  [facility];  Service: Endoscopy;;  gastric esophagus  . BLADDER TUMOR EXCISION    . BROW LIFT Bilateral 04/27/2019   Procedure: BILATERAL BLEPHAROPLASTY;  Surgeon: Baruch Goldmann, MD;  Location: [facility] ORS;  Service: Ophthalmology;  Laterality: Bilateral;  . CARDIAC CATHETERIZATION N/A 04/10/2015   Procedure: Left Heart Cath and Cors/Grafts Angiography;  Surgeon: Sherren Mocha, MD;  Location: [place] CV  LAB;  Service: Cardiovascular;  Laterality: N/A;  . CARDIAC CATHETERIZATION N/A 04/10/2015   Procedure: Coronary Stent Intervention;  Surgeon: Sherren Mocha, MD;  Location: [place] CV LAB;  Service: Cardiovascular;  Laterality: N/A;  . CATARACT EXTRACTION W/PHACO Right 06/23/2015   Procedure: CATARACT EXTRACTION PHACO AND INTRAOCULAR LENS PLACEMENT RIGHT EYE; CDE:  7.08;  Surgeon: Tonny Branch, MD;  Location: [facility] ORS;  Service: Ophthalmology;  Laterality: Right;  . CATARACT EXTRACTION W/PHACO Left 08/25/2015   Procedure: CATARACT EXTRACTION PHACO AND INTRAOCULAR LENS PLACEMENT LEFT EYE; CDE:  8.18;  Surgeon: Tonny Branch, MD;  Location: [facility] ORS;  Service: Ophthalmology;  Laterality: Left;  . COLONOSCOPY N/A 12/28/2012   Procedure: COLONOSCOPY;  Surgeon: Rogene Houston, MD;  [facility]: [facility];  Service: Endoscopy;  Laterality: N/A;  830 rescheduled  . CORONARY ANGIOPLASTY WITH STENT PLACEMENT  2013; 04/10/2015   "this makes me a total of 7" (04/10/2015)  . CORONARY ARTERY BYPASS GRAFT  1997   with (LIMA)  . ESOPHAGEAL DILATION N/A 11/26/2015   Procedure: ESOPHAGEAL DILATION;  Surgeon: Rogene Houston, MD;  [facility];  Service: Endoscopy;  Laterality: N/A;  . ESOPHAGEAL DILATION N/A 05/23/2017   Procedure: ESOPHAGEAL DILATION;  Surgeon: Rogene Houston, MD;  [facility];  Service: Endoscopy;  Laterality: N/A;  . ESOPHAGEAL DILATION N/A 06/16/2017   Procedure: ESOPHAGEAL DILATION;  Surgeon: Rogene Houston, MD;  [facility];  Service: Endoscopy;  Laterality: N/A;  . ESOPHAGOGASTRODUODENOSCOPY N/A 11/26/2015   Procedure: ESOPHAGOGASTRODUODENOSCOPY (EGD);  Surgeon: Rogene Houston, MD;  [facility];  Service: Endoscopy;  Laterality: N/A;  1:25  . ESOPHAGOGASTRODUODENOSCOPY N/A 05/23/2017   Procedure: ESOPHAGOGASTRODUODENOSCOPY (EGD);  Surgeon: Rogene Houston, MD;  [facility];  Service: Endoscopy;  Laterality: N/A;  1:15  .  ESOPHAGOGASTRODUODENOSCOPY N/A 06/16/2017   Procedure: ESOPHAGOGASTRODUODENOSCOPY (EGD);  Surgeon: Rogene Houston, MD;  [facility];  Service: Endoscopy;  Laterality: N/A;  1240  . HERNIA REPAIR Left   . KNEE CARTILAGE SURGERY Bilateral   . LEFT HEART CATH AND CORS/GRAFTS ANGIOGRAPHY N/A 09/08/2016   Procedure: LEFT HEART CATH AND CORS/GRAFTS ANGIOGRAPHY;  Surgeon: Martinique, Peter M, MD;  Location: [place] CV LAB;  Service: Cardiovascular;  Laterality: N/A;  . LEFT HEART CATHETERIZATION WITH CORONARY ANGIOGRAM N/A 09/08/2011   Procedure: LEFT HEART CATHETERIZATION WITH CORONARY ANGIOGRAM;  Surgeon: Sherren Mocha, MD;  Location: [facility] CATH LAB;  Service: Cardiovascular;  Laterality: N/A;  . PERCUTANEOUS CORONARY STENT INTERVENTION (PCI-S) Right 09/08/2011   Procedure: PERCUTANEOUS CORONARY STENT INTERVENTION (PCI-S);  Surgeon: Sherren Mocha, MD;  Location: [facility] CATH LAB;  Service: Cardiovascular;  Laterality: Right;  . ROBOT ASSISTED LAPAROSCOPIC RADICAL PROSTATECTOMY      There were no vitals filed for this visit.   Subjective Assessment - 12/20/19 1008    Subjective Feeling better after dry needling and feels like I can look up better. Still difficulty with looking side to side    Pertinent History stent,  HTN,    Limitations House hold activities;Lifting    Patient Stated Goals to get some exercises to get my motion back    Currently in Pain? Yes    Pain Score 2     Pain Location Neck    Pain Orientation Lateral    Pain Descriptors / Indicators Aching    Pain Type Chronic pain    Pain Onset More than a month ago    Aggravating Factors  turning, looking up              [facility] PT Assessment - 12/20/19 0001      Assessment   Medical Diagnosis cervical pain                          [facility] Adult PT Treatment/Exercise - 12/20/19 0001      Exercises   Exercises Shoulder      Neck Exercises: Seated   Neck Retraction 10 reps;5 secs    Cervical Rotation  Both;5 reps   10 sec hold   Cervical Rotation Limitations use of towel wrap for overpressure    Lateral Flexion Both;5 reps   10 sec hold   Other Seated Exercise cervical snag   extension and rotation snags with towel for overpressure     Neck Exercises: Supine   Capital Flexion 5 reps;5 secs   cervical "liftoff" for deep flexor strengthening     Shoulder Exercises: Standing   Extension Strengthening;10 reps    Theraband Level (Shoulder Extension) Level 2 (Red)    Row 10 reps;Strengthening    Theraband Level (Shoulder Row) Level 2 (Red)    Other Standing Exercises scaption with 3 lbs 2x10      Neck Exercises: Stretches  Upper Trapezius Stretch Right;Left;2 reps;30 seconds    Levator Stretch Right;Left;2 reps;30 seconds                  PT Education - 12/20/19 1051    Education Details education/demonstration in HEP additions for segmental cervical ROM with towel for overpressure    Person(s) Educated Patient    Methods Explanation;Demonstration    Comprehension Verbalized understanding;Returned demonstration            PT Short Term Goals - 11/19/19 0849      PT SHORT TERM GOAL #1   Title Pt to be I in HEP to improve both cervical motion and pain    Time 2    Period Weeks    Status Achieved    Target Date 11/20/19      PT SHORT TERM GOAL #2   Title PT to be able to rotate cervical spine B to 45 degrees to improve safety while driving.    Time 2    Period Weeks    Status On-going             PT Long Term Goals - 11/30/19 1336      PT LONG TERM GOAL #1   Title PT to be I in advance HEP to improve cervical pain and ROM    Time 4    Period Weeks    Status On-going      PT LONG TERM GOAL #2   Title PT to be able to rotate cervical area to both the right and the left 60 degrees without experiencing any increased pain.    Time 4    Period Weeks    Status On-going      PT LONG TERM GOAL #3   Title PT cervical strength to 5/5 to decrease cervical pain  to no greater than a 2/10 with all activities    Time 4    Period Weeks    Status On-going                 Plan - 12/20/19 1007    Clinical Impression Statement Patient tolerating treatment sessions well without referred pain and symptoms staying localized to upper cervical spine with greatest limitaions in left/right rotation and upper cervical extension. Continues to require verbal cues and demonstration for proper set-up and execution of cervical ROM and strengthening exercises to continue with developing independence in HEP    Personal Factors and Comorbidities Time since onset of injury/illness/exacerbation    Examination-Activity Limitations Carry;Reach Overhead    Examination-Participation Restrictions Driving;Meal Prep;Yard Work;Cleaning    Stability/Clinical Decision Making Evolving/Moderate complexity    Rehab Potential Good    PT Frequency 2x / week    PT Duration 4 weeks    PT Treatment/Interventions Manual techniques;Patient/family education;Passive range of motion;Dry needling;Therapeutic exercise    PT Next Visit Plan Conitine postural strength progressions as tolerated. Manual STM and dry needling as indicated. Techniques for deep cervical flexor strengthening and assess thoracic spine mobility    PT Home Exercise Plan sitting scapular, cervical retraction and sidebending, 10/29 neck retraction supine, w back 11/15/19: cervical sidebend isometrics; supine neck rotation on mylar ball, self trigger point therapy, doorway pec stretch in 3 positions 12/18/19 band rows and extensions; cervical snag for extension/rotation and deep flexor strength in supine    Consulted and Agree with Plan of Care Patient           Patient will benefit from skilled therapeutic intervention in order to  improve the following deficits and impairments:  Decreased range of motion,Decreased strength,Postural dysfunction,Increased fascial restricitons,Pain  Visit Diagnosis: Cervicalgia  Stiffness  of neck     Problem List Patient Active Problem List   Diagnosis Date Noted  . Esophageal dysphagia 05/04/2017  . Chest pain 09/08/2016  . Dysphagia 09/22/2015  . Exertional angina ([place]) 04/10/2015  . Abnormal nuclear cardiac imaging test 04/08/2015  . Aortic stenosis 09/24/2011  . Unstable angina ([place]) 09/09/2011  . Murmur 04/19/2011  . Coronary artery disease 12/08/2010  . Hypothyroidism 09/28/2008  . Hyperlipidemia 09/28/2008  . Essential hypertension 09/28/2008  . CAD, ARTERY BYPASS GRAFT 09/28/2008  . DEGENERATIVE JOINT DISEASE 09/28/2008  . BACK PAIN, CHRONIC 09/28/2008    11:18 AM, 12/20/19 M. Sherlyn Lees, PT, DPT Physical Therapist- [place] Office Number: [phone]  [place] [address] [place], [place], [address] Phone: [phone]   Fax:  [phone]  Name: [name] MRN: [id number] Date of Birth: 1945-02-15

## 2019-12-25 ENCOUNTER — Ambulatory Visit (HOSPITAL_COMMUNITY): Payer: Medicare Other | Admitting: Physical Therapy

## 2019-12-25 ENCOUNTER — Other Ambulatory Visit: Payer: Self-pay

## 2019-12-25 ENCOUNTER — Encounter (HOSPITAL_COMMUNITY): Payer: Self-pay | Admitting: Physical Therapy

## 2019-12-25 DIAGNOSIS — M436 Torticollis: Secondary | ICD-10-CM

## 2019-12-25 DIAGNOSIS — M542 Cervicalgia: Secondary | ICD-10-CM | POA: Diagnosis not present

## 2019-12-25 NOTE — Therapy (Signed)
[place], [place], [address] Phone: [phone]   Fax:  [phone]  Physical Therapy Treatment  Patient Details  Name: [name] MRN: [id number] Date of Birth: 1945/11/24 Referring Provider (PT): Collene Mares   Encounter Date: 12/25/2019   PT End of Session - 12/25/19 1047    Visit Number 14    Number of Visits 16    Date for PT Re-Evaluation 12/28/19    Authorization Type UHC medicare    Progress Note Due on Visit 16    PT Start Time 1047    PT Stop Time 1127    PT Time Calculation (min) 40 min    Activity Tolerance Patient tolerated treatment well    Behavior During Therapy [facility] for tasks assessed/performed           Past Medical History:  Diagnosis Date  . Anginal pain ([place])   . Arthritis    "all over"  . Bladder tumor   . Chronic lower back pain   . Coronary artery disease    a. s/p CABG x 2 (LIMA->LAD, RIMA->RCA);  b. s/p multiple PCI's to Ramus;  c. 08/2011 Cath/PCI: LM 70% into ramus with 70-80% there->treated wtih 3.5x18 Xience Xpedition DES, LCX  nonobs, RCA occluded.  RIMA & LIMA patent, EF 55-65%  . DJD (degenerative joint disease)   . GERD (gastroesophageal reflux disease)   . History of gout   . Hyperlipidemia   . Hypertension   . Hypothyroidism   . Neuropathy   . OSA on CPAP   . Prostate cancer [facility])     Past Surgical History:  Procedure Laterality Date  . ABDOMINAL HERNIA REPAIR    . BIOPSY  11/26/2015   Procedure: BIOPSY;  Surgeon: Rogene Houston, MD;  [facility];  Service: Endoscopy;;  gastric esophagus  . BLADDER TUMOR EXCISION    . BROW LIFT Bilateral 04/27/2019   Procedure: BILATERAL BLEPHAROPLASTY;  Surgeon: Baruch Goldmann, MD;  Location: [facility] ORS;  Service: Ophthalmology;  Laterality: Bilateral;  . CARDIAC CATHETERIZATION N/A 04/10/2015   Procedure: Left Heart Cath and Cors/Grafts Angiography;  Surgeon: Sherren Mocha, MD;  Location: [place] CV  LAB;  Service: Cardiovascular;  Laterality: N/A;  . CARDIAC CATHETERIZATION N/A 04/10/2015   Procedure: Coronary Stent Intervention;  Surgeon: Sherren Mocha, MD;  Location: [place] CV LAB;  Service: Cardiovascular;  Laterality: N/A;  . CATARACT EXTRACTION W/PHACO Right 06/23/2015   Procedure: CATARACT EXTRACTION PHACO AND INTRAOCULAR LENS PLACEMENT RIGHT EYE; CDE:  7.08;  Surgeon: Tonny Branch, MD;  Location: [facility] ORS;  Service: Ophthalmology;  Laterality: Right;  . CATARACT EXTRACTION W/PHACO Left 08/25/2015   Procedure: CATARACT EXTRACTION PHACO AND INTRAOCULAR LENS PLACEMENT LEFT EYE; CDE:  8.18;  Surgeon: Tonny Branch, MD;  Location: [facility] OR[facility];  Service: Ophthalmology;  Laterality: Left;  . COLONOSCOPY N/A 12/28/2012   Procedure: COLONOSCOPY;  Surgeon: Rogene Houston, MD;  [facility]: [facility];  Service: Endoscopy;  Laterality: N/A;  830 rescheduled  . CORONARY ANGIOPLASTY WITH STENT PLACEMENT  2013; 04/10/2015   "this makes me a total of 7" (04/10/2015)  . CORONARY ARTERY BYPASS GRAFT  1997   with (LIMA)  . ESOPHAGEAL DILATION N/A 11/26/2015   Procedure: ESOPHAGEAL DILATION;  Surgeon: Rogene Houston, MD;  [facility];  Service: Endoscopy;  Laterality: N/A;  . ESOPHAGEAL DILATION N/A 05/23/2017   Procedure: ESOPHAGEAL DILATION;  Surgeon: Rogene Houston, MD;  [facility];  Service: Endoscopy;  Laterality: N/A;  . ESOPHAGEAL DILATION N/A 06/16/2017   Procedure: ESOPHAGEAL DILATION;  Surgeon: Rogene Houston, MD;  [facility];  Service: Endoscopy;  Laterality: N/A;  . ESOPHAGOGASTRODUODENOSCOPY N/A 11/26/2015   Procedure: ESOPHAGOGASTRODUODENOSCOPY (EGD);  Surgeon: Rogene Houston, MD;  [facility];  Service: Endoscopy;  Laterality: N/A;  1:25  . ESOPHAGOGASTRODUODENOSCOPY N/A 05/23/2017   Procedure: ESOPHAGOGASTRODUODENOSCOPY (EGD);  Surgeon: Rogene Houston, MD;  [facility];  Service: Endoscopy;  Laterality: N/A;  1:15  .  ESOPHAGOGASTRODUODENOSCOPY N/A 06/16/2017   Procedure: ESOPHAGOGASTRODUODENOSCOPY (EGD);  Surgeon: Rogene Houston, MD;  [facility];  Service: Endoscopy;  Laterality: N/A;  1240  . HERNIA REPAIR Left   . KNEE CARTILAGE SURGERY Bilateral   . LEFT HEART CATH AND CORS/GRAFTS ANGIOGRAPHY N/A 09/08/2016   Procedure: LEFT HEART CATH AND CORS/GRAFTS ANGIOGRAPHY;  Surgeon: Martinique, Peter M, MD;  Location: [place] CV LAB;  Service: Cardiovascular;  Laterality: N/A;  . LEFT HEART CATHETERIZATION WITH CORONARY ANGIOGRAM N/A 09/08/2011   Procedure: LEFT HEART CATHETERIZATION WITH CORONARY ANGIOGRAM;  Surgeon: Sherren Mocha, MD;  Location: [facility] CATH LAB;  Service: Cardiovascular;  Laterality: N/A;  . PERCUTANEOUS CORONARY STENT INTERVENTION (PCI-S) Right 09/08/2011   Procedure: PERCUTANEOUS CORONARY STENT INTERVENTION (PCI-S);  Surgeon: Sherren Mocha, MD;  Location: [facility] CATH LAB;  Service: Cardiovascular;  Laterality: Right;  . ROBOT ASSISTED LAPAROSCOPIC RADICAL PROSTATECTOMY      There were no vitals filed for this visit.   Subjective Assessment - 12/25/19 1047    Subjective Patient reports increase in neck soreness over the weekend and attributes it to "overdoing it" with his exercises over the weekend, but at present reports 1/10 neck pain    Pertinent History stent,  HTN,    Limitations House hold activities;Lifting    Patient Stated Goals to get some exercises to get my motion back    Currently in Pain? Yes    Pain Score 1     Pain Location Neck    Pain Orientation Left;Lateral    Pain Descriptors / Indicators Aching    Pain Type Chronic pain    Pain Onset More than a month ago    Aggravating Factors  looking to the left              [facility] PT Assessment - 12/25/19 0001      Assessment   Medical Diagnosis cervical pain     Referring Provider (PT) Collene Mares                         [facility] Adult PT Treatment/Exercise - 12/25/19 0001      Neck Exercises:  Seated   Neck Retraction 10 reps;5 secs    Cervical Rotation Both;5 reps    Cervical Rotation Limitations use of towel wrap for overpressure    Lateral Flexion Both;5 reps    Postural Training anterior shoulder and pec stretch to reduce rounded shoulders and improve postural alignment 3x30 sec      Neck Exercises: Supine   Capital Flexion 5 reps;5 secs    Other Supine Exercise cervical extension with strap for fulcrum 3x10 reps, 2 sec hold            Trigger Point Dry Needling - 12/25/19 0001    Consent Given? Yes    Education Handout Provided Previously provided    Muscles Treated Head and Neck Upper trapezius    Other  Dry Needling RT and LT upper trapezius with pistoning, patient in prone for all, tolerated well    Upper Trapezius Response Twitch reponse elicited;Palpable increased muscle length   decreased neck pain left rotation               PT Education - 12/25/19 1057    Education Details pt educated in HEP dosage to reduce effects of "overworking" his PRE and ROM activities to improve understanding of proper ther ex prescription    Person(s) Educated Patient    Methods Explanation    Comprehension Verbalized understanding            PT Short Term Goals - 11/19/19 0849      PT SHORT TERM GOAL #1   Title Pt to be I in HEP to improve both cervical motion and pain    Time 2    Period Weeks    Status Achieved    Target Date 11/20/19      PT SHORT TERM GOAL #2   Title PT to be able to rotate cervical spine B to 45 degrees to improve safety while driving.    Time 2    Period Weeks    Status On-going             PT Long Term Goals - 11/30/19 1336      PT LONG TERM GOAL #1   Title PT to be I in advance HEP to improve cervical pain and ROM    Time 4    Period Weeks    Status On-going      PT LONG TERM GOAL #2   Title PT to be able to rotate cervical area to both the right and the left 60 degrees without experiencing any increased pain.    Time 4     Period Weeks    Status On-going      PT LONG TERM GOAL #3   Title PT cervical strength to 5/5 to decrease cervical pain to no greater than a 2/10 with all activities    Time 4    Period Weeks    Status On-going                 Plan - 12/25/19 1058    Clinical Impression Statement Patient tolerating treatment sessions well and despite his recent exacerbation of symptoms his issues resolved since this session with soreness lasting 2 days.  Patient educated an understands proper execution of PRE and ROM prescription.  Patient woulod benefit from continued services to develop and instruct in self-management techniques for chronic neck pain and instruct in corrective exercises for self-progression and management. Will try traction to assess for pain relief and increased ROM    Personal Factors and Comorbidities Time since onset of injury/illness/exacerbation    Examination-Activity Limitations Carry;Reach Overhead    Examination-Participation Restrictions Driving;Meal Prep;Yard Work;Cleaning    Stability/Clinical Decision Making Evolving/Moderate complexity    Rehab Potential Good    PT Frequency 2x / week    PT Duration 4 weeks    PT Treatment/Interventions Manual techniques;Patient/family education;Passive range of motion;Dry needling;Therapeutic exercise    PT Next Visit Plan Conitine postural strength progressions as tolerated. Manual STM and dry needling as indicated. Techniques for deep cervical flexor strengthening and assess thoracic spine mobility. Cervical traction    PT Home Exercise Plan sitting scapular, cervical retraction and sidebending, 10/29 neck retraction supine, w back 11/15/19: cervical sidebend isometrics; supine neck rotation on mylar ball, self trigger point therapy,  doorway pec stretch in 3 positions 12/18/19 band rows and extensions; cervical snag for extension/rotation and deep flexor strength in supine    Consulted and Agree with Plan of Care Patient            Patient will benefit from skilled therapeutic intervention in order to improve the following deficits and impairments:  Decreased range of motion,Decreased strength,Postural dysfunction,Increased fascial restricitons,Pain  Visit Diagnosis: Cervicalgia  Stiffness of neck     Problem List Patient Active Problem List   Diagnosis Date Noted  . Esophageal dysphagia 05/04/2017  . Chest pain 09/08/2016  . Dysphagia 09/22/2015  . Exertional angina ([place]) 04/10/2015  . Abnormal nuclear cardiac imaging test 04/08/2015  . Aortic stenosis 09/24/2011  . Unstable angina ([place]) 09/09/2011  . Murmur 04/19/2011  . Coronary artery disease 12/08/2010  . Hypothyroidism 09/28/2008  . Hyperlipidemia 09/28/2008  . Essential hypertension 09/28/2008  . CAD, ARTERY BYPASS GRAFT 09/28/2008  . DEGENERATIVE JOINT DISEASE 09/28/2008  . BACK PAIN, CHRONIC 09/28/2008   11:31 AM, 12/25/19 M. Sherlyn Lees, PT, DPT Physical Therapist- [place] Office Number: [phone]  Dry needling performed by: 11:31 AM, 12/25/19 Jerene Pitch, PT, DPT Physical Therapist- [place] Office Number: [phone] [phone] [place], [place], [address] Phone: [phone]   Fax:  [phone]  Name: [name] MRN: [id number] Date of Birth: February 19, 1945

## 2019-12-27 ENCOUNTER — Ambulatory Visit (HOSPITAL_COMMUNITY): Payer: Medicare Other | Admitting: Physical Therapy

## 2019-12-27 ENCOUNTER — Other Ambulatory Visit: Payer: Self-pay

## 2019-12-27 ENCOUNTER — Encounter (HOSPITAL_COMMUNITY): Payer: Self-pay | Admitting: Physical Therapy

## 2019-12-27 DIAGNOSIS — M436 Torticollis: Secondary | ICD-10-CM

## 2019-12-27 DIAGNOSIS — M542 Cervicalgia: Secondary | ICD-10-CM

## 2019-12-27 NOTE — Therapy (Signed)
[facility] [address] [place], [place], [address] Phone: [phone]   Fax:  [phone]  Physical Therapy Treatment and Discharge Summary  Patient Details  Name: [name] MRN: [id number] Date of Birth: 04-13-1945 Referring Provider (PT): Collene Mares  PHYSICAL THERAPY DISCHARGE SUMMARY  Visits from Start of Care: 15  Current functional level related to goals / functional outcomes: Patient demonstrates improved status and met 2/2 STG and 2/3 LTG   Remaining deficits: No functional limitations   Education / Equipment: Updated HEP Plan: Patient agrees to discharge.  Patient goals were met. Patient is being discharged due to meeting the stated rehab goals.  ?????          [facility] PT Assessment - 12/27/19 0001      Assessment   Medical Diagnosis cervical pain     Referring Provider (PT) Collene Mares      AROM   Cervical Flexion WNL    Cervical Extension 30    Cervical - Right Side Bend 15    Cervical - Left Side Bend 12    Cervical - Right Rotation 45    Cervical - Left Rotation 45           Encounter Date: 12/27/2019   PT End of Session - 12/27/19 1059    Visit Number 15    Number of Visits 16    Date for PT Re-Evaluation 12/28/19    Authorization Type [employer] medicare    Progress Note Due on Visit 16    PT Start Time 1053    PT Stop Time 1127    PT Time Calculation (min) 34 min    Activity Tolerance Patient tolerated treatment well    Behavior During Therapy WFL for tasks assessed/performed           Past Medical History:  Diagnosis Date  . Anginal pain ([place])   . Arthritis    "all over"  . Bladder tumor   . Chronic lower back pain   . Coronary artery disease    a. s/p CABG x 2 (LIMA->LAD, RIMA->RCA);  b. s/p multiple PCI's to Ramus;  c. 08/2011 Cath/PCI: LM 70% into ramus with 70-80% there->treated wtih 3.5x18 Xience Xpedition DES, LCX  nonobs, RCA occluded.  RIMA & LIMA patent, EF 55-65%  .  DJD (degenerative joint disease)   . GERD (gastroesophageal reflux disease)   . History of gout   . Hyperlipidemia   . Hypertension   . Hypothyroidism   . Neuropathy   . OSA on CPAP   . Prostate cancer [facility])     Past Surgical History:  Procedure Laterality Date  . ABDOMINAL HERNIA REPAIR    . BIOPSY  11/26/2015   Procedure: BIOPSY;  Surgeon: Rogene Houston, MD;  [facility];  Service: Endoscopy;;  gastric esophagus  . BLADDER TUMOR EXCISION    . BROW LIFT Bilateral 04/27/2019   Procedure: BILATERAL BLEPHAROPLASTY;  Surgeon: Baruch Goldmann, MD;  Location: [facility];  Service: Ophthalmology;  Laterality: Bilateral;  . CARDIAC CATHETERIZATION N/A 04/10/2015   Procedure: Left Heart Cath and Cors/Grafts Angiography;  Surgeon: Sherren Mocha, MD;  Location: [place] CV LAB;  Service: Cardiovascular;  Laterality: N/A;  . CARDIAC CATHETERIZATION N/A 04/10/2015   Procedure: Coronary Stent Intervention;  Surgeon: Sherren Mocha, MD;  Location: [place] CV LAB;  Service: Cardiovascular;  Laterality: N/A;  . CATARACT EXTRACTION W/PHACO Right 06/23/2015   Procedure: CATARACT EXTRACTION PHACO AND INTRAOCULAR LENS PLACEMENT  RIGHT EYE; CDE:  7.08;  Surgeon: Tonny Branch, MD;  Location: [facility];  Service: Ophthalmology;  Laterality: Right;  . CATARACT EXTRACTION W/PHACO Left 08/25/2015   Procedure: CATARACT EXTRACTION PHACO AND INTRAOCULAR LENS PLACEMENT LEFT EYE; CDE:  8.18;  Surgeon: Tonny Branch, MD;  Location: [facility];  Service: Ophthalmology;  Laterality: Left;  . COLONOSCOPY N/A 12/28/2012   Procedure: COLONOSCOPY;  Surgeon: Rogene Houston, MD;  [facility];  Service: Endoscopy;  Laterality: N/A;  830 rescheduled  . CORONARY ANGIOPLASTY WITH STENT PLACEMENT  2013; 04/10/2015   "this makes me a total of 7" (04/10/2015)  . CORONARY ARTERY BYPASS GRAFT  1997   with (LIMA)  . ESOPHAGEAL DILATION N/A 11/26/2015   Procedure: ESOPHAGEAL DILATION;  Surgeon: Rogene Houston, MD;   [facility];  Service: Endoscopy;  Laterality: N/A;  . ESOPHAGEAL DILATION N/A 05/23/2017   Procedure: ESOPHAGEAL DILATION;  Surgeon: Rogene Houston, MD;  [facility];  Service: Endoscopy;  Laterality: N/A;  . ESOPHAGEAL DILATION N/A 06/16/2017   Procedure: ESOPHAGEAL DILATION;  Surgeon: Rogene Houston, MD;  [facility];  Service: Endoscopy;  Laterality: N/A;  . ESOPHAGOGASTRODUODENOSCOPY N/A 11/26/2015   Procedure: ESOPHAGOGASTRODUODENOSCOPY (EGD);  Surgeon: Rogene Houston, MD;  [facility];  Service: Endoscopy;  Laterality: N/A;  1:25  . ESOPHAGOGASTRODUODENOSCOPY N/A 05/23/2017   Procedure: ESOPHAGOGASTRODUODENOSCOPY (EGD);  Surgeon: Rogene Houston, MD;  [facility];  Service: Endoscopy;  Laterality: N/A;  1:15  . ESOPHAGOGASTRODUODENOSCOPY N/A 06/16/2017   Procedure: ESOPHAGOGASTRODUODENOSCOPY (EGD);  Surgeon: Rogene Houston, MD;  [facility];  Service: Endoscopy;  Laterality: N/A;  1240  . HERNIA REPAIR Left   . KNEE CARTILAGE SURGERY Bilateral   . LEFT HEART CATH AND CORS/GRAFTS ANGIOGRAPHY N/A 09/08/2016   Procedure: LEFT HEART CATH AND CORS/GRAFTS ANGIOGRAPHY;  Surgeon: Martinique, Peter M, MD;  Location: [facility] CV LAB;  Service: Cardiovascular;  Laterality: N/A;  . LEFT HEART CATHETERIZATION WITH CORONARY ANGIOGRAM N/A 09/08/2011   Procedure: LEFT HEART CATHETERIZATION WITH CORONARY ANGIOGRAM;  Surgeon: Sherren Mocha, MD;  [facility] CATH LAB;  Service: Cardiovascular;  Laterality: N/A;  . PERCUTANEOUS CORONARY STENT INTERVENTION (PCI-S) Right 09/08/2011   Procedure: PERCUTANEOUS CORONARY STENT INTERVENTION (PCI-S);  Surgeon: Sherren Mocha, MD;  Location: [facility] CATH LAB;  Service: Cardiovascular;  Laterality: Right;  . ROBOT ASSISTED LAPAROSCOPIC RADICAL PROSTATECTOMY      There were no vitals filed for this visit.   Subjective Assessment - 12/27/19 1054    Subjective Patient reports overall improvement in his  neck pain/stiffness with decrease in soreness after exercise completion    Pertinent History stent,  HTN,    Limitations House hold activities;Lifting    Patient Stated Goals to get some exercises to get my motion back    Currently in Pain? No/denies    Pain Score 0-No pain    Pain Onset More than a month ago              [facility] PT Assessment - 12/27/19 0001      Assessment   Medical Diagnosis cervical pain     Referring Provider (PT) Collene Mares      AROM   Cervical Flexion WNL    Cervical Extension 30    Cervical - Right Side Bend 15    Cervical - Left Side Bend 12    Cervical - Right Rotation 45    Cervical - Left Rotation 45  [facility] PT Treatment/Exercise - 12/27/19 0001      Neck Exercises: Seated   Neck Retraction 5 reps;5 secs    Cervical Rotation Both;5 reps    Cervical Rotation Limitations use of towel wrap for overpressure    Lateral Flexion Both;5 reps      Neck Exercises: Supine   Neck Retraction 5 secs;20 reps    Capital Flexion 5 reps;5 secs    Other Supine Exercise cervical extension with strap for fulcrum 3x10 reps, 2 sec hold                  PT Education - 12/27/19 1124    Education Details Patient educated in continuation of HEP and educated on techniques for self-progression    Person(s) Educated Patient    Methods Explanation;Demonstration    Comprehension Verbalized understanding;Returned demonstration            PT Short Term Goals - 12/27/19 1109      PT SHORT TERM GOAL #1   Title Pt to be I in HEP to improve both cervical motion and pain    Baseline met    Time 2    Period Weeks    Status Achieved    Target Date 11/20/19      PT SHORT TERM GOAL #2   Title PT to be able to rotate cervical spine B to 45 degrees to improve safety while driving.    Baseline 40-45 degrees    Time 2    Period Weeks    Status Achieved             PT Long Term Goals - 12/27/19 1107      PT  LONG TERM GOAL #1   Title PT to be I in advance HEP to improve cervical pain and ROM    Baseline indepdent performance    Time 4    Period Weeks    Status Achieved      PT LONG TERM GOAL #2   Title PT to be able to rotate cervical area to both the right and the left 60 degrees without experiencing any increased pain.    Baseline imrpoved range to 40-45 degrees    Time 4    Period Weeks    Status Partially Met      PT LONG TERM GOAL #3   Title PT cervical strength to 5/5 to decrease cervical pain to no greater than a 2/10 with all activities    Baseline reports "soreness" vs sharp pains at start of care    Time 4    Period Weeks    Status Achieved                 Plan - 12/27/19 1112    Clinical Impression Statement Patient has tolerated tx sessions well without increase in symptoms and demonstrates compliance with HEP and postural re-education techniques with good performance and not requiring cues for proper position or set-up.  Patient reports improvements in comfort when driving and reports better ability to turn head at traffic turns. Patient will transition to HEP and D/C PT services at this time.    Personal Factors and Comorbidities Time since onset of injury/illness/exacerbation    Examination-Activity Limitations Carry;Reach Overhead    Examination-Participation Restrictions Driving;Meal Prep;Yard Work;Cleaning    Stability/Clinical Decision Making Evolving/Moderate complexity    Rehab Potential Good    PT Frequency 2x / week    PT Duration 4 weeks    PT Treatment/Interventions  Manual techniques;Patient/family education;Passive range of motion;Dry needling;Therapeutic exercise    PT Next Visit Plan Conitine postural strength progressions as tolerated. Manual STM and dry needling as indicated. Techniques for deep cervical flexor strengthening and assess thoracic spine mobility. Cervical traction    PT Home Exercise Plan sitting scapular, cervical retraction and  sidebending, 10/29 neck retraction supine, w back 11/15/19: cervical sidebend isometrics; supine neck rotation on mylar ball, self trigger point therapy, doorway pec stretch in 3 positions 12/18/19 band rows and extensions; cervical snag for extension/rotation and deep flexor strength in supine    Consulted and Agree with Plan of Care Patient           Patient will benefit from skilled therapeutic intervention in order to improve the following deficits and impairments:  Decreased range of motion,Decreased strength,Postural dysfunction,Increased fascial restricitons,Pain  Visit Diagnosis: Cervicalgia  Stiffness of neck     Problem List Patient Active Problem List   Diagnosis Date Noted  . Esophageal dysphagia 05/04/2017  . Chest pain 09/08/2016  . Dysphagia 09/22/2015  . Exertional angina ([place]) 04/10/2015  . Abnormal nuclear cardiac imaging test 04/08/2015  . Aortic stenosis 09/24/2011  . Unstable angina ([place]) 09/09/2011  . Murmur 04/19/2011  . Coronary artery disease 12/08/2010  . Hypothyroidism 09/28/2008  . Hyperlipidemia 09/28/2008  . Essential hypertension 09/28/2008  . CAD, ARTERY BYPASS GRAFT 09/28/2008  . DEGENERATIVE JOINT DISEASE 09/28/2008  . BACK PAIN, CHRONIC 09/28/2008   11:28 AM, 12/27/19 M. Sherlyn Lees, PT, DPT Physical Therapist- [place] Office Number: [phone]  [facility] [address] [place], [place], [address] Phone: [phone]   Fax:  [phone]  Name: [name] MRN: [id number] Date of Birth: 03-30-1945

## 2020-01-14 ENCOUNTER — Ambulatory Visit: Payer: Medicare Other | Admitting: Cardiovascular Disease

## 2020-01-17 DIAGNOSIS — N35010 Post-traumatic urethral stricture, male, meatal: Secondary | ICD-10-CM | POA: Diagnosis not present

## 2020-01-17 DIAGNOSIS — Z8551 Personal history of malignant neoplasm of bladder: Secondary | ICD-10-CM | POA: Diagnosis not present

## 2020-01-17 DIAGNOSIS — C672 Malignant neoplasm of lateral wall of bladder: Secondary | ICD-10-CM | POA: Diagnosis not present

## 2020-01-25 ENCOUNTER — Other Ambulatory Visit: Payer: Self-pay | Admitting: Cardiovascular Disease

## 2020-01-28 ENCOUNTER — Other Ambulatory Visit: Payer: Self-pay | Admitting: Cardiovascular Disease

## 2020-01-31 ENCOUNTER — Ambulatory Visit: Payer: Medicare Other | Admitting: Cardiovascular Disease

## 2020-02-18 ENCOUNTER — Other Ambulatory Visit: Payer: Self-pay | Admitting: Cardiovascular Disease

## 2020-03-03 ENCOUNTER — Other Ambulatory Visit: Payer: Self-pay | Admitting: Cardiovascular Disease

## 2020-05-01 DIAGNOSIS — R718 Other abnormality of red blood cells: Secondary | ICD-10-CM | POA: Diagnosis not present

## 2020-05-01 DIAGNOSIS — R310 Gross hematuria: Secondary | ICD-10-CM | POA: Diagnosis not present

## 2020-05-01 DIAGNOSIS — N35010 Post-traumatic urethral stricture, male, meatal: Secondary | ICD-10-CM | POA: Diagnosis not present

## 2020-05-01 DIAGNOSIS — R8290 Unspecified abnormal findings in urine: Secondary | ICD-10-CM | POA: Diagnosis not present

## 2020-05-01 DIAGNOSIS — C672 Malignant neoplasm of lateral wall of bladder: Secondary | ICD-10-CM | POA: Diagnosis not present

## 2020-05-01 DIAGNOSIS — R800 Isolated proteinuria: Secondary | ICD-10-CM | POA: Diagnosis not present

## 2020-05-01 DIAGNOSIS — D72829 Elevated white blood cell count, unspecified: Secondary | ICD-10-CM | POA: Diagnosis not present

## 2020-05-01 DIAGNOSIS — Z8551 Personal history of malignant neoplasm of bladder: Secondary | ICD-10-CM | POA: Diagnosis not present

## 2020-05-14 ENCOUNTER — Other Ambulatory Visit: Payer: Self-pay | Admitting: Cardiovascular Disease

## 2020-05-19 DIAGNOSIS — C672 Malignant neoplasm of lateral wall of bladder: Secondary | ICD-10-CM | POA: Diagnosis not present

## 2020-05-19 DIAGNOSIS — Z01812 Encounter for preprocedural laboratory examination: Secondary | ICD-10-CM | POA: Diagnosis not present

## 2020-05-28 ENCOUNTER — Encounter (HOSPITAL_COMMUNITY): Payer: Self-pay | Admitting: *Deleted

## 2020-05-28 ENCOUNTER — Other Ambulatory Visit: Payer: Self-pay

## 2020-05-28 ENCOUNTER — Emergency Department (HOSPITAL_COMMUNITY): Payer: Medicare Other

## 2020-05-28 ENCOUNTER — Observation Stay (HOSPITAL_COMMUNITY)
Admission: EM | Admit: 2020-05-28 | Discharge: 2020-05-29 | Disposition: A | Discharge status: Home / Self Care | Payer: Medicare Other | Attending: Internal Medicine | Admitting: Internal Medicine

## 2020-05-28 DIAGNOSIS — E782 Mixed hyperlipidemia: Secondary | ICD-10-CM | POA: Diagnosis not present

## 2020-05-28 DIAGNOSIS — Z79899 Other long term (current) drug therapy: Secondary | ICD-10-CM | POA: Diagnosis not present

## 2020-05-28 DIAGNOSIS — I10 Essential (primary) hypertension: Secondary | ICD-10-CM | POA: Diagnosis present

## 2020-05-28 DIAGNOSIS — R1319 Other dysphagia: Secondary | ICD-10-CM | POA: Diagnosis present

## 2020-05-28 DIAGNOSIS — Z9989 Dependence on other enabling machines and devices: Secondary | ICD-10-CM

## 2020-05-28 DIAGNOSIS — G4733 Obstructive sleep apnea (adult) (pediatric): Secondary | ICD-10-CM | POA: Diagnosis not present

## 2020-05-28 DIAGNOSIS — I2510 Atherosclerotic heart disease of native coronary artery without angina pectoris: Secondary | ICD-10-CM | POA: Diagnosis not present

## 2020-05-28 DIAGNOSIS — R42 Dizziness and giddiness: Secondary | ICD-10-CM | POA: Diagnosis not present

## 2020-05-28 DIAGNOSIS — Z951 Presence of aortocoronary bypass graft: Secondary | ICD-10-CM | POA: Insufficient documentation

## 2020-05-28 DIAGNOSIS — Z8546 Personal history of malignant neoplasm of prostate: Secondary | ICD-10-CM | POA: Insufficient documentation

## 2020-05-28 DIAGNOSIS — E785 Hyperlipidemia, unspecified: Secondary | ICD-10-CM | POA: Diagnosis present

## 2020-05-28 DIAGNOSIS — M542 Cervicalgia: Secondary | ICD-10-CM | POA: Diagnosis not present

## 2020-05-28 DIAGNOSIS — Z20822 Contact with and (suspected) exposure to covid-19: Secondary | ICD-10-CM | POA: Diagnosis not present

## 2020-05-28 DIAGNOSIS — E039 Hypothyroidism, unspecified: Secondary | ICD-10-CM | POA: Diagnosis present

## 2020-05-28 DIAGNOSIS — K219 Gastro-esophageal reflux disease without esophagitis: Secondary | ICD-10-CM

## 2020-05-28 DIAGNOSIS — Z87891 Personal history of nicotine dependence: Secondary | ICD-10-CM | POA: Insufficient documentation

## 2020-05-28 DIAGNOSIS — Z7982 Long term (current) use of aspirin: Secondary | ICD-10-CM | POA: Insufficient documentation

## 2020-05-28 LAB — CBC
HCT: 34.8 % — ABNORMAL LOW (ref 39.0–52.0)
Hemoglobin: 11.5 g/dL — ABNORMAL LOW (ref 13.0–17.0)
MCH: 33 pg (ref 26.0–34.0)
MCHC: 33 g/dL (ref 30.0–36.0)
MCV: 99.7 fL (ref 80.0–100.0)
Platelets: 185 10*3/uL (ref 150–400)
RBC: 3.49 MIL/uL — ABNORMAL LOW (ref 4.22–5.81)
RDW: 13.2 % (ref 11.5–15.5)
WBC: 5.3 10*3/uL (ref 4.0–10.5)
nRBC: 0 % (ref 0.0–0.2)

## 2020-05-28 LAB — RESP PANEL BY RT-PCR (FLU A&B, COVID) ARPGX2
Influenza A by PCR: NEGATIVE
Influenza B by PCR: NEGATIVE
SARS Coronavirus 2 by RT PCR: NEGATIVE

## 2020-05-28 LAB — URINALYSIS, ROUTINE W REFLEX MICROSCOPIC
Bacteria, UA: NONE SEEN
Bilirubin Urine: NEGATIVE
Glucose, UA: NEGATIVE mg/dL
Ketones, ur: NEGATIVE mg/dL
Leukocytes,Ua: NEGATIVE
Nitrite: NEGATIVE
Protein, ur: NEGATIVE mg/dL
Specific Gravity, Urine: 1.014 (ref 1.005–1.030)
pH: 6 (ref 5.0–8.0)

## 2020-05-28 LAB — BASIC METABOLIC PANEL
Anion gap: 8 (ref 5–15)
BUN: 22 mg/dL (ref 8–23)
CO2: 25 mmol/L (ref 22–32)
Calcium: 8.8 mg/dL — ABNORMAL LOW (ref 8.9–10.3)
Chloride: 103 mmol/L (ref 98–111)
Creatinine, Ser: 1.2 mg/dL (ref 0.61–1.24)
GFR, Estimated: >60 mL/min (ref >60)
Glucose, Bld: 107 mg/dL — ABNORMAL HIGH (ref 70–99)
Potassium: 3.8 mmol/L (ref 3.5–5.1)
Sodium: 136 mmol/L (ref 135–145)

## 2020-05-28 MED ORDER — ISOSORBIDE MONONITRATE ER 60 MG PO TB24
60.0000 mg | ORAL_TABLET | Freq: Every day | ORAL | Status: DC
  Administered 2020-05-29: 60 mg via ORAL
  Filled 2020-05-28: qty 1

## 2020-05-28 MED ORDER — ATORVASTATIN CALCIUM 40 MG PO TABS
40.0000 mg | ORAL_TABLET | Freq: Every day | ORAL | Status: DC
  Administered 2020-05-29: 40 mg via ORAL
  Filled 2020-05-28: qty 1

## 2020-05-28 MED ORDER — GABAPENTIN 300 MG PO CAPS
600.0000 mg | ORAL_CAPSULE | Freq: Three times a day (TID) | ORAL | Status: DC
  Administered 2020-05-28 – 2020-05-29 (×2): 600 mg via ORAL
  Filled 2020-05-28 (×2): qty 2

## 2020-05-28 MED ORDER — AMLODIPINE BESYLATE 5 MG PO TABS
10.0000 mg | ORAL_TABLET | Freq: Every day | ORAL | Status: DC
  Administered 2020-05-29: 10 mg via ORAL
  Filled 2020-05-28: qty 2

## 2020-05-28 MED ORDER — MECLIZINE HCL 12.5 MG PO TABS: 25.0000 mg | ORAL_TABLET | Freq: Once | ORAL | Status: DC

## 2020-05-28 MED ORDER — PANTOPRAZOLE SODIUM 40 MG PO TBEC
40.0000 mg | DELAYED_RELEASE_TABLET | Freq: Every day | ORAL | Status: DC
  Administered 2020-05-29: 40 mg via ORAL
  Filled 2020-05-28: qty 1

## 2020-05-28 MED ORDER — OMEGA-3-ACID ETHYL ESTERS 1 G PO CAPS
1.0000 g | ORAL_CAPSULE | Freq: Every day | ORAL | Status: DC
  Administered 2020-05-29: 1 g via ORAL
  Filled 2020-05-28: qty 1

## 2020-05-28 MED ORDER — LEVOTHYROXINE SODIUM 75 MCG PO TABS: 150.0000 ug | ORAL_TABLET | Freq: Every day | ORAL | Status: DC

## 2020-05-28 MED ORDER — TRAMADOL HCL 50 MG PO TABS
50.0000 mg | ORAL_TABLET | Freq: Once | ORAL | Status: AC
  Administered 2020-05-28: 50 mg via ORAL
  Filled 2020-05-28: qty 1

## 2020-05-28 MED ORDER — GABAPENTIN 300 MG PO CAPS
600.0000 mg | ORAL_CAPSULE | Freq: Once | ORAL | Status: AC
  Administered 2020-05-28: 600 mg via ORAL
  Filled 2020-05-28: qty 2

## 2020-05-28 MED ORDER — ENOXAPARIN SODIUM 40 MG/0.4ML IJ SOSY
40.0000 mg | PREFILLED_SYRINGE | INTRAMUSCULAR | Status: DC
  Filled 2020-05-28: qty 0.4

## 2020-05-28 MED ORDER — TRAMADOL HCL 50 MG PO TABS
50.0000 mg | ORAL_TABLET | Freq: Three times a day (TID) | ORAL | Status: DC
Start: 2020-05-28 — End: 2020-05-29
  Administered 2020-05-28 – 2020-05-29 (×2): 50 mg via ORAL
  Filled 2020-05-28 (×2): qty 1

## 2020-05-28 NOTE — ED Notes (Signed)
Pt to MRI

## 2020-05-28 NOTE — ED Notes (Signed)
Theus Espin- [phone]

## 2020-05-28 NOTE — ED Notes (Addendum)
Pt's daughter reports he stopped taking his Plavix for an two upcoming surgeries coming soon. She also reports his speech sounded slow and slurred when she talked to him on the phone.

## 2020-05-28 NOTE — H&P (Signed)
History and Physical  [name] [id number] DOB: [birth year] DOA: 05/28/2020  Referring physician: Kem Parkinson, PA-C PCP: Sharilyn Sites, MD  Patient coming from: Home  Chief Complaint: Dizziness   HPI: [name] is a 75 y.o. male with medical history significant for CAD s/p CABG x 2 (LIMA -> LAD, RIMA -> RCA), GERD, hypertension, hyperlipidemia, hypothyroidism, cervicalgia who presents to the emergency department due to 3 to 4-day onset of dizziness which is associated with nausea without vomiting.  He complained of worsening symptoms this morning due to unsteady balance with swaying to one side and with near fainting sensation.  Patient states that he had to sit down and called a friend who checked his vital signs on his cell phone and noted that his O2at was in the 80s and HR in at 62/min, since he lives close to the hospital, his friend dropped him off at the ED for further evaluation.  Patient complained of sensation of a movement at the base of the skull which coincides with movement of his head as well as movement of his eyes with tendency to catch up with vision when he turns his head.  He endorsed intermittent pain on the right side of his neck near the base of his skull (patient has history of cervicalgia).  He stopped taking his Plavix and aspirin 2 to 3 days ago due to an upcoming surgical procedure.  Patient also complained of difficulty in being able to swallow which occurs mostly with dinner.  He denies chest pain, fever, chills, numbness and tingling.  ED Course:  In the emergency department, he was bradycardic in the high 50s, BP was 145/70, O2 sat was 96-100% on room air.  Work-up in the ED showed normocytic anemia and normal BMP, urinalysis was unimpressive for UTI.  Influenza A, B and SARS coronavirus 2 was negative. X-ray showed no active disease CT of head without contrast showed no evidence of acute intracranial abnormality MRI of head without contrast  showed no acute intracranial abnormality He was treated with gabapentin, tramadol and meclizine, hospitalist was asked to admit patient for further evaluation and management.  Review of Systems: Review of Systems  Constitutional: Negative for chills and fever.  HENT: Negative for ear pain and sore throat.   Eyes: Negative for pain and visual disturbance.  Respiratory: Negative for cough, chest tightness and shortness of breath.   Cardiovascular: Negative for chest pain and palpitations.  Gastrointestinal: Positive for nausea and increasing difficulty in swallowing (worse with dinner). Endocrine: Negative for polyphagia and polyuria.  Genitourinary: Negative for decreased urine volume, dysuria, enuresis Musculoskeletal: Negative for arthralgias and back pain.  Skin: Negative for color change and rash.  Allergic/Immunologic: Negative for immunocompromised state.  Neurological: Positive for dizziness and weakness negative for tremors, syncope, speech difficulty hematological: Does not bruise/bleed easily.  All other systems reviewed and are negative   Past Medical History:  Diagnosis Date  . Anginal pain ([place])   . Arthritis    "all over"  . Bladder tumor   . Chronic lower back pain   . Coronary artery disease    a. s/p CABG x 2 (LIMA->LAD, RIMA->RCA);  b. s/p multiple PCI's to Ramus;  c. 08/2011 Cath/PCI: LM 70% into ramus with 70-80% there->treated wtih 3.5x18 Xience Xpedition DES, LCX  nonobs, RCA occluded.  RIMA & LIMA patent, EF 55-65%  . DJD (degenerative joint disease)   . GERD (gastroesophageal reflux disease)   . History of gout   . Hyperlipidemia   .  Hypertension   . Hypothyroidism   . Neuropathy   . OSA on CPAP   . Prostate cancer [facility])    Past Surgical History:  Procedure Laterality Date  . ABDOMINAL HERNIA REPAIR    . BIOPSY  11/26/2015   Procedure: BIOPSY;  Surgeon: Rogene Houston, MD;  [facility];  Service: Endoscopy;;  gastric esophagus  . BLADDER  TUMOR EXCISION    . BROW LIFT Bilateral 04/27/2019   Procedure: BILATERAL BLEPHAROPLASTY;  Surgeon: Baruch Goldmann, MD;  Location: [facility] OR[facility];  Service: Ophthalmology;  Laterality: Bilateral;  . CARDIAC CATHETERIZATION N/A 04/10/2015   Procedure: Left Heart Cath and Cors/Grafts Angiography;  Surgeon: Sherren Mocha, MD;  Location: [place] CV LAB;  Service: Cardiovascular;  Laterality: N/A;  . CARDIAC CATHETERIZATION N/A 04/10/2015   Procedure: Coronary Stent Intervention;  Surgeon: Sherren Mocha, MD;  Location: [place] CV LAB;  Service: Cardiovascular;  Laterality: N/A;  . CATARACT EXTRACTION W/PHACO Right 06/23/2015   Procedure: CATARACT EXTRACTION PHACO AND INTRAOCULAR LENS PLACEMENT RIGHT EYE; CDE:  7.08;  Surgeon: Tonny Branch, MD;  Location: [facility];  Service: Ophthalmology;  Laterality: Right;  . CATARACT EXTRACTION W/PHACO Left 08/25/2015   Procedure: CATARACT EXTRACTION PHACO AND INTRAOCULAR LENS PLACEMENT LEFT EYE; CDE:  8.18;  Surgeon: Tonny Branch, MD;  Location: [facility];  Service: Ophthalmology;  Laterality: Left;  . COLONOSCOPY N/A 12/28/2012   Procedure: COLONOSCOPY;  Surgeon: Rogene Houston, MD;  [facility];  Service: Endoscopy;  Laterality: N/A;  830 rescheduled  . CORONARY ANGIOPLASTY WITH STENT PLACEMENT  2013; 04/10/2015   "this makes me a total of 7" (04/10/2015)  . CORONARY ARTERY BYPASS GRAFT  1997   with (LIMA)  . ESOPHAGEAL DILATION N/A 11/26/2015   Procedure: ESOPHAGEAL DILATION;  Surgeon: Rogene Houston, MD;  [facility];  Service: Endoscopy;  Laterality: N/A;  . ESOPHAGEAL DILATION N/A 05/23/2017   Procedure: ESOPHAGEAL DILATION;  Surgeon: Rogene Houston, MD;  [facility];  Service: Endoscopy;  Laterality: N/A;  . ESOPHAGEAL DILATION N/A 06/16/2017   Procedure: ESOPHAGEAL DILATION;  Surgeon: Rogene Houston, MD;  [facility];  Service: Endoscopy;  Laterality: N/A;  . ESOPHAGOGASTRODUODENOSCOPY N/A 11/26/2015   Procedure:  ESOPHAGOGASTRODUODENOSCOPY (EGD);  Surgeon: Rogene Houston, MD;  [facility];  Service: Endoscopy;  Laterality: N/A;  1:25  . ESOPHAGOGASTRODUODENOSCOPY N/A 05/23/2017   Procedure: ESOPHAGOGASTRODUODENOSCOPY (EGD);  Surgeon: Rogene Houston, MD;  [facility];  Service: Endoscopy;  Laterality: N/A;  1:15  . ESOPHAGOGASTRODUODENOSCOPY N/A 06/16/2017   Procedure: ESOPHAGOGASTRODUODENOSCOPY (EGD);  Surgeon: Rogene Houston, MD;  [facility];  Service: Endoscopy;  Laterality: N/A;  1240  . HERNIA REPAIR Left   . KNEE CARTILAGE SURGERY Bilateral   . LEFT HEART CATH AND CORS/GRAFTS ANGIOGRAPHY N/A 09/08/2016   Procedure: LEFT HEART CATH AND CORS/GRAFTS ANGIOGRAPHY;  Surgeon: Martinique, Peter M, MD;  Location: [place] CV LAB;  Service: Cardiovascular;  Laterality: N/A;  . LEFT HEART CATHETERIZATION WITH CORONARY ANGIOGRAM N/A 09/08/2011   Procedure: LEFT HEART CATHETERIZATION WITH CORONARY ANGIOGRAM;  Surgeon: Sherren Mocha, MD;  Location: [facility] CATH LAB;  Service: Cardiovascular;  Laterality: N/A;  . PERCUTANEOUS CORONARY STENT INTERVENTION (PCI-S) Right 09/08/2011   Procedure: PERCUTANEOUS CORONARY STENT INTERVENTION (PCI-S);  Surgeon: Sherren Mocha, MD;  Location: [facility] CATH LAB;  Service: Cardiovascular;  Laterality: Right;  . ROBOT ASSISTED LAPAROSCOPIC RADICAL PROSTATECTOMY      Social History:  reports  that he has quit smoking. His smoking use included cigars. He quit after 12.00 years of use. He has never used smokeless tobacco. He reports current alcohol use. He reports that he does not use drugs.   Allergies  Allergen Reactions  . Codeine Nausea Only  . Pollen Extract Other (See Comments)    Seasonal    Family History  Problem Relation Age of Onset  . Cancer Father 32       died  . Stroke Mother 68       died     Prior to Admission medications   Medication Sig Start Date End Date Taking? Authorizing Provider  amLODipine (NORVASC) 10 MG tablet Take 1  tablet by mouth daily. 04/20/20  Yes [provider]  aspirin 81 MG tablet Take 1 tablet (81 mg total) by mouth at bedtime. 06/17/17  Yes Rehman, Mechele Dawley, MD  atorvastatin (LIPITOR) 40 MG tablet Take 1 tablet (40 mg total) by mouth daily. Please call and schedule annual appointment for continued refills. 03/03/20 06/01/20 Yes Sherren Mocha, MD  B Complex Vitamins (VITAMIN B COMPLEX) TABS Take 1 tablet by mouth daily.   Yes [provider]  celecoxib (CELEBREX) 200 MG capsule Take 1 capsule (200 mg total) by mouth daily. 01/26/12  Yes Hillary Bow, MD  cetirizine (ZYRTEC) 10 MG tablet Take 10 mg by mouth daily.   Yes [provider]  Cholecalciferol 125 MCG (5000 UT) TABS Take 1 tablet by mouth daily.   Yes [provider]  clopidogrel (PLAVIX) 75 MG tablet Take 1 tablet (75 mg total) by mouth in the morning. Please make overdue appt with Dr. Burt Knack before anymore refills. Thank you 1st attempt 02/18/20  Yes Sherren Mocha, MD  cyanocobalamin (,VITAMIN B-12,) 1000 MCG/ML injection Inject 1,000 mcg into the muscle See admin instructions. Monthly as needed 03/25/15  Yes [provider]  gabapentin (NEURONTIN) 600 MG tablet Take 600 mg by mouth 3 (three) times daily. 02/27/15  Yes [provider]  glucosamine-chondroitin 500-400 MG tablet Take 1 tablet by mouth daily.   Yes [provider]  isosorbide mononitrate (IMDUR) 60 MG 24 hr tablet TAKE 1 TABLET BY MOUTH DAILY 10/25/19  Yes Sherren Mocha, MD  levothyroxine (SYNTHROID, LEVOTHROID) 150 MCG tablet Take 150 mcg by mouth daily before breakfast.   Yes [provider]  Multiple Vitamin (MULTIVITAMIN) tablet Take 1 tablet by mouth every other day.   Yes [provider]  nitroGLYCERIN (NITROSTAT) 0.4 MG SL tablet Place 1 tablet (0.4 mg total) under the tongue every 5 (five) minutes as needed for chest pain. Please make appt with Dr. Burt Knack.1st attempt 09/29/17  Yes Sherren Mocha, MD  Omega-3 Fatty Acids (FISH OIL) 1200 MG CAPS Take 1,200 mg by mouth daily.  06/16/17  Yes Rehman, Mechele Dawley, MD  Polyvinyl Alcohol-Povidone (REFRESH OP) Place 1 drop into both eyes daily as needed (dry eyes).   Yes [provider]  Potassium Gluconate 2.5 MEQ TABS Take 2 tablets by mouth at bedtime.   Yes [provider]  traMADol (ULTRAM) 50 MG tablet Take 50 mg by mouth 3 (three) times daily. 12/24/13  Yes [provider]  Biotin 5000 MCG CAPS Take 5,000 mcg by mouth daily.  Patient not taking: Reported on 05/28/2020    [provider]    Physical Exam: BP (!) 161/72 (BP Location: Left Arm)   Pulse (!) 58   Temp 98 F (36.7 C) (Oral)   Resp 20  Ht 6' 1.5" (1.867 m)   Wt 98 kg   SpO2 98%   BMI 28.11 kg/m   . General: 75 y.o. year-old male well developed well nourished in no acute distress.  Alert and oriented x3. Marland Kitchen HEENT: NCAT, EOMI . Neck: Supple, trachea medial . Cardiovascular: Regular rate and rhythm with no rubs or gallops.  No thyromegaly or JVD noted.  No lower extremity edema. 2/4 pulses in all 4 extremities. Marland Kitchen Respiratory: Clear to auscultation with no wheezes or rales. Good inspiratory effort. . Abdomen: Soft nontender nondistended with normal bowel sounds x4 quadrants. . Muskuloskeletal: No cyanosis, clubbing or edema noted bilaterally . Neuro: Nystagmus noted on lowering the head of the bed from the 45 degree head of bed elevation.  CN II-XII intact, strength 5/5 x 4, sensation intact . Skin: No ulcerative lesions noted or rashes . Psychiatry: Judgement and insight appear normal. Mood is appropriate for condition and setting          Labs on Admission:  Basic Metabolic Panel: Recent Labs  Lab 05/28/20 1436  NA 136  K 3.8  CL 103  CO2 25  GLUCOSE 107*  BUN 22  CREATININE 1.20  CALCIUM 8.8*   Liver Function Tests: No results for input(s): AST, ALT, ALKPHOS, BILITOT, PROT, ALBUMIN in the last 168 hours. No  results for input(s): LIPASE, AMYLASE in the last 168 hours. No results for input(s): AMMONIA in the last 168 hours. CBC: Recent Labs  Lab 05/28/20 1436  WBC 5.3  HGB 11.5*  HCT 34.8*  MCV 99.7  PLT 185   Cardiac Enzymes: No results for input(s): CKTOTAL, CKMB, CKMBINDEX, TROPONINI in the last 168 hours.  BNP (last 3 results) No results for input(s): BNP in the last 8760 hours.  ProBNP (last 3 results) No results for input(s): PROBNP in the last 8760 hours.  CBG: No results for input(s): GLUCAP in the last 168 hours.  Radiological Exams on Admission: CT Head Wo Contrast  Result Date: 05/28/2020 CLINICAL DATA:  Dizziness. EXAM: CT HEAD WITHOUT CONTRAST TECHNIQUE: Contiguous axial images were obtained from the base of the skull through the vertex without intravenous contrast. COMPARISON:  MRI April 09, 2008. FINDINGS: Brain: No evidence of acute large vascular territory infarction, hemorrhage, hydrocephalus, extra-axial collection or mass lesion/mass effect. Calcification along the anterior falx. Mild for age patchy white matter hypoattenuation, most likely related to chronic microvascular ischemic disease. Vascular: No hyperdense vessel identified. Calcific atherosclerosis. Skull: No acute fracture. Sinuses/Orbits: Clear visualized sinuses.  Unremarkable orbits. Other: No mastoid effusions. IMPRESSION: No evidence of acute intracranial abnormality. Electronically Signed   By: Margaretha Sheffield MD   On: 05/28/2020 16:04   MR BRAIN WO CONTRAST  Result Date: 05/28/2020 CLINICAL DATA:  Dizziness. EXAM: MRI HEAD WITHOUT CONTRAST TECHNIQUE: Multiplanar, multiecho pulse sequences of the brain and surrounding structures were obtained without intravenous contrast. COMPARISON:  Head CT 05/28/2020 and MRI 04/09/2008 FINDINGS: Brain: There is no evidence of an acute infarct, mass, midline shift, or extra-axial fluid collection. Patchy T2 hyperintensities in the cerebral white matter bilaterally  have progressed from 2010 and are nonspecific but compatible with mild-to-moderate chronic small vessel ischemic disease. The ventricles and sulci are within normal limits for age. There are likely a few chronic microhemorrhages in the left temporoparietal region. Vascular: Major intracranial vascular flow voids are preserved. Skull and upper cervical spine: Unremarkable bone marrow signal. Sinuses/Orbits: Bilateral cataract extraction. Paranasal sinuses and mastoid air cells are clear. Other: None. IMPRESSION: 1. No acute  intracranial abnormality. 2. Mild-to-moderate chronic small vessel ischemic disease. Electronically Signed   By: Logan Bores M.D.   On: 05/28/2020 18:58   DG Chest Portable 1 View  Result Date: 05/28/2020 CLINICAL DATA:  Dizziness EXAM: PORTABLE CHEST 1 VIEW COMPARISON:  09/07/2016 FINDINGS: Post sternotomy changes. Coronary stent. Linear atelectasis or scarring at the left base. No consolidation or effusion. Normal cardiomediastinal silhouette. No pneumothorax. IMPRESSION: No active disease.  Scarring or atelectasis left base Electronically Signed   By: Donavan Foil M.D.   On: 05/28/2020 20:14    EKG: I independently viewed the EKG done and my findings are as followed:   Assessment/Plan Present on Admission: . Hypothyroidism . Hyperlipidemia . Essential hypertension . Coronary artery disease . Esophageal dysphagia  Principal Problem:   Dizziness Active Problems:   Hypothyroidism   Hyperlipidemia   Essential hypertension   Coronary artery disease   Esophageal dysphagia   Cervicalgia   Dizziness vs vertigo (BPPV) Patient complains of dizziness.  Nystagmus noted on lowering head of elevated bed and on head movement PT will be consulted for Dix-Hallpike and possibly Epley maneuver in the morning  Esophageal dysphagia Patient complains of 1-2-week onset of increasing swallowing difficulty which worsens at dinnertime Patient was noted to have had esophageal  dilatation past (11/2017, 05/2017, 06/2017) Patient will need outpatient follow-up with GI  GERD Continue Protonix  Essential hypertension Continue amlodipine  Hyperlipidemia Continue Lipitor, Lovaza  Hypothyroidism Continue Synthroid  CAD s/p CABG Patient stopped taking aspirin and Plavix due to impending surgical procedure (malignant neoplasm of lateral wall of urinary bladder) Continue Imdur, Lipitor  Cervicalgia Patient did follow with outpatient rehab with last visit being on 12/27/2019 per medical record Continue tramadol, gabapentin  Obstructive sleep apnea on CPAP Continue CPAP  DVT prophylaxis: Lovenox  Code Status: Full code  Family Communication: None at bedside  Disposition Plan:  Patient is from:                        home Anticipated DC to:                   home Anticipated DC date:               1 day Anticipated DC barriers:          Patient requires inpatient management for Vertigo  Consults called: Physical Therapy  Admission status: Observation  Bernadette Hoit MD [facility]  05/28/2020, 9:42 PM

## 2020-05-28 NOTE — ED Provider Notes (Signed)
[facility] EMERGENCY DEPARTMENT Provider Note   CSN: [id number] Arrival date & time: 05/28/20  1228     History Chief Complaint  Patient presents with  . Dizziness    [name] is a 75 y.o. male.  HPI      [name] is a 75 y.o. male with past medical history of coronary artery disease, hypertension, and hypothyroidism who presents to the Emergency Department complaining of generalized weakness, shortness of breath, nausea and dizziness.  States he is felt "not myself" for 1 week he further describes this as dizziness and generalized weakness.  This morning around 6 AM he states that his symptoms were more pronounced and associated with unsteady balance.  States that a friend came over and checked his vital signs on a cell phone and states that he is heart rate and oxygen level were both low.  He describes having an intermittent pain on the right side of his neck near the base of his skull.  This pain is worse with movement of his head.  He denies any visual changes, chest pain, fever, chills, shortness of breath or known sick contacts.  No history of prior CVAs.  States that he stopped his Plavix and aspirin 2 to 3 days ago for an upcoming surgery.   Past Medical History:  Diagnosis Date  . Anginal pain ([place])   . Arthritis    "all over"  . Bladder tumor   . Chronic lower back pain   . Coronary artery disease    a. s/p CABG x 2 (LIMA->LAD, RIMA->RCA);  b. s/p multiple PCI's to Ramus;  c. 08/2011 Cath/PCI: LM 70% into ramus with 70-80% there->treated wtih 3.5x18 Xience Xpedition DES, LCX  nonobs, RCA occluded.  RIMA & LIMA patent, EF 55-65%  . DJD (degenerative joint disease)   . GERD (gastroesophageal reflux disease)   . History of gout   . Hyperlipidemia   . Hypertension   . Hypothyroidism   . Neuropathy   . OSA on CPAP   . Prostate cancer [facility])     Patient Active Problem List   Diagnosis Date Noted  . Esophageal dysphagia 05/04/2017  . Chest pain  09/08/2016  . Dysphagia 09/22/2015  . Exertional angina ([place]) 04/10/2015  . Abnormal nuclear cardiac imaging test 04/08/2015  . Aortic stenosis 09/24/2011  . Unstable angina ([place]) 09/09/2011  . Murmur 04/19/2011  . Coronary artery disease 12/08/2010  . Hypothyroidism 09/28/2008  . Hyperlipidemia 09/28/2008  . Essential hypertension 09/28/2008  . CAD, ARTERY BYPASS GRAFT 09/28/2008  . DEGENERATIVE JOINT DISEASE 09/28/2008  . BACK PAIN, CHRONIC 09/28/2008    Past Surgical History:  Procedure Laterality Date  . ABDOMINAL HERNIA REPAIR    . BIOPSY  11/26/2015   Procedure: BIOPSY;  Surgeon: Rogene Houston, MD;  Location: [facility] ENDO SUITE;  Service: Endoscopy;;  gastric esophagus  . BLADDER TUMOR EXCISION    . BROW LIFT Bilateral 04/27/2019   Procedure: BILATERAL BLEPHAROPLASTY;  Surgeon: Baruch Goldmann, MD;  Location: [facility] ORS;  Service: Ophthalmology;  Laterality: Bilateral;  . CARDIAC CATHETERIZATION N/A 04/10/2015   Procedure: Left Heart Cath and Cors/Grafts Angiography;  Surgeon: Sherren Mocha, MD;  Location: [place] CV LAB;  Service: Cardiovascular;  Laterality: N/A;  . CARDIAC CATHETERIZATION N/A 04/10/2015   Procedure: Coronary Stent Intervention;  Surgeon: Sherren Mocha, MD;  Location: [place] CV LAB;  Service: Cardiovascular;  Laterality: N/A;  . CATARACT EXTRACTION W/PHACO Right 06/23/2015   Procedure: CATARACT EXTRACTION PHACO AND INTRAOCULAR  LENS PLACEMENT RIGHT EYE; CDE:  7.08;  Surgeon: Tonny Branch, MD;  Location: [facility] ORS;  Service: Ophthalmology;  Laterality: Right;  . CATARACT EXTRACTION W/PHACO Left 08/25/2015   Procedure: CATARACT EXTRACTION PHACO AND INTRAOCULAR LENS PLACEMENT LEFT EYE; CDE:  8.18;  Surgeon: Tonny Branch, MD;  Location: [facility] ORS;  Service: Ophthalmology;  Laterality: Left;  . COLONOSCOPY N/A 12/28/2012   Procedure: COLONOSCOPY;  Surgeon: Rogene Houston, MD;  Location: [facility] ENDO SU[facility];  Service: Endoscopy;  Laterality: N/A;  830 rescheduled  . CORONARY  ANGIOPLASTY WITH STENT PLACEMENT  2013; 04/10/2015   "this makes me a total of 7" (04/10/2015)  . CORONARY ARTERY BYPASS GRAFT  1997   with (LIMA)  . ESOPHAGEAL DILATION N/A 11/26/2015   Procedure: ESOPHAGEAL DILATION;  Surgeon: Rogene Houston, MD;  Location: [facility];  Service: Endoscopy;  Laterality: N/A;  . ESOPHAGEAL DILATION N/A 05/23/2017   Procedure: ESOPHAGEAL DILATION;  Surgeon: Rogene Houston, MD;  [facility];  Service: Endoscopy;  Laterality: N/A;  . ESOPHAGEAL DILATION N/A 06/16/2017   Procedure: ESOPHAGEAL DILATION;  Surgeon: Rogene Houston, MD;  [facility];  Service: Endoscopy;  Laterality: N/A;  . ESOPHAGOGASTRODUODENOSCOPY N/A 11/26/2015   Procedure: ESOPHAGOGASTRODUODENOSCOPY (EGD);  Surgeon: Rogene Houston, MD;  [facility];  Service: Endoscopy;  Laterality: N/A;  1:25  . ESOPHAGOGASTRODUODENOSCOPY N/A 05/23/2017   Procedure: ESOPHAGOGASTRODUODENOSCOPY (EGD);  Surgeon: Rogene Houston, MD;  [facility];  Service: Endoscopy;  Laterality: N/A;  1:15  . ESOPHAGOGASTRODUODENOSCOPY N/A 06/16/2017   Procedure: ESOPHAGOGASTRODUODENOSCOPY (EGD);  Surgeon: Rogene Houston, MD;  Location[facility];  Service: Endoscopy;  Laterality: N/A;  1240  . HERNIA REPAIR Left   . KNEE CARTILAGE SURGERY Bilateral   . LEFT HEART CATH AND CORS/GRAFTS ANGIOGRAPHY N/A 09/08/2016   Procedure: LEFT HEART CATH AND CORS/GRAFTS ANGIOGRAPHY;  Surgeon: Martinique, Peter M, MD;  Location: [place] CV LAB;  Service: Cardiovascular;  Laterality: N/A;  . LEFT HEART CATHETERIZATION WITH CORONARY ANGIOGRAM N/A 09/08/2011   Procedure: LEFT HEART CATHETERIZATION WITH CORONARY ANGIOGRAM;  Surgeon: Sherren Mocha, MD;  Location: [facility] CATH LAB;  Service: Cardiovascular;  Laterality: N/A;  . PERCUTANEOUS CORONARY STENT INTERVENTION (PCI-S) Right 09/08/2011   Procedure: PERCUTANEOUS CORONARY STENT INTERVENTION (PCI-S);  Surgeon: Sherren Mocha, MD;  Location: [facility] CATH  LAB;  Service: Cardiovascular;  Laterality: Right;  . ROBOT ASSISTED LAPAROSCOPIC RADICAL PROSTATECTOMY         Family History  Problem Relation Age of Onset  . Cancer Father 81       died  . Stroke Mother 48       died    Social History   Tobacco Use  . Smoking status: Former Smoker    Years: 12.00    Types: Cigars  . Smokeless tobacco: Never Used  . Tobacco comment: "quit smoking in ~ 1978"  Substance Use Topics  . Alcohol use: Yes    Comment: occasionally   . Drug use: No    Home Medications Prior to Admission medications   Medication Sig Start Date End Date Taking? Authorizing Provider  aspirin 81 MG tablet Take 1 tablet (81 mg total) by mouth at bedtime. 06/17/17   Rehman, Mechele Dawley, MD  atorvastatin (LIPITOR) 40 MG tablet Take 1 tablet (40 mg total) by mouth daily. Please call and schedule annual appointment for continued refills. 03/03/20 06/01/20  Sherren Mocha, MD  Biotin 5000 MCG CAPS Take 5,000 mcg by mouth  daily.     [provider]  celecoxib (CELEBREX) 200 MG capsule Take 1 capsule (200 mg total) by mouth daily. 01/26/12   Hillary Bow, MD  clopidogrel (PLAVIX) 75 MG tablet Take 1 tablet (75 mg total) by mouth in the morning. Please make overdue appt with Dr. Burt Knack before anymore refills. Thank you 1st attempt 02/18/20   Sherren Mocha, MD  cyanocobalamin (,VITAMIN B-12,) 1000 MCG/ML injection Inject 1,000 mcg into the muscle See admin instructions. Monthly as needed 03/25/15   [provider]  gabapentin (NEURONTIN) 600 MG tablet Take 600 mg by mouth 3 (three) times daily. 02/27/15   [provider]  glucosamine-chondroitin 500-400 MG tablet Take 1 tablet by mouth daily.    [provider]  isosorbide mononitrate (IMDUR) 60 MG 24 hr tablet TAKE 1 TABLET BY MOUTH DAILY 10/25/19   Sherren Mocha, MD  levothyroxine (SYNTHROID, LEVOTHROID) 150 MCG tablet Take 150 mcg by mouth daily before breakfast.    [provider]   Multiple Vitamin (MULTIVITAMIN) tablet Take 1 tablet by mouth every other day.     [provider]  nitroGLYCERIN (NITROSTAT) 0.4 MG SL tablet Place 1 tablet (0.4 mg total) under the tongue every 5 (five) minutes as needed for chest pain. Please make appt with Dr. Burt Knack.1st attempt 09/29/17   Sherren Mocha, MD  Omega-3 Fatty Acids (FISH OIL) 1200 MG CAPS Take 1,200 mg by mouth daily.  06/16/17   Rogene Houston, MD  Polyvinyl Alcohol-Povidone (REFRESH OP) Place 1 drop into both eyes daily as needed (dry eyes).    [provider]  traMADol (ULTRAM) 50 MG tablet Take 50 mg by mouth 3 (three) times daily.  12/24/13   [provider]    Allergies    Codeine and Pollen extract  Review of Systems   Review of Systems  Constitutional: Negative for chills, fatigue and fever.  HENT: Negative for sore throat and trouble swallowing.   Respiratory: Negative for cough, shortness of breath and wheezing.   Cardiovascular: Negative for chest pain and palpitations.  Gastrointestinal: Positive for nausea. Negative for abdominal pain, blood in stool and vomiting.  Genitourinary: Negative for difficulty urinating, dysuria and hematuria.  Musculoskeletal: Negative for arthralgias, back pain, myalgias, neck pain and neck stiffness.  Skin: Negative for rash.  Neurological: Positive for dizziness, weakness and headaches. Negative for syncope, speech difficulty and numbness.  Hematological: Does not bruise/bleed easily.  Psychiatric/Behavioral: Negative for confusion.    Physical Exam Updated Vital Signs BP (!) 156/64   Pulse (!) 55   Temp 98.1 F (36.7 C) (Oral)   Resp 13   Ht 6' 1.5" (1.867 m)   Wt 98 kg   SpO2 93%   BMI 28.11 kg/m   Physical Exam Vitals and nursing note reviewed.  Constitutional:      Appearance: Normal appearance. He is not ill-appearing.  HENT:     Head: Normocephalic.     Mouth/Throat:     Mouth: Mucous membranes are moist.  Eyes:      Extraocular Movements: Extraocular movements intact.     Pupils: Pupils are equal, round, and reactive to light.  Neck:     Comments: Mild ttp of the right cervical paraspinal muscles.  No midline tenderness Cardiovascular:     Rate and Rhythm: Normal rate and regular rhythm.     Pulses: Normal pulses.  Pulmonary:     Effort: Pulmonary effort is normal.     Breath sounds: Normal breath sounds.  Abdominal:     Palpations: Abdomen is soft.     Tenderness: There is no abdominal tenderness.  Musculoskeletal:     Cervical back: Normal range of motion. Tenderness present.     Right lower leg: No edema.     Left lower leg: No edema.  Skin:    General: Skin is warm.     Capillary Refill: Capillary refill takes less than 2 seconds.     Findings: No erythema or rash.  Neurological:     General: No focal deficit present.     Mental Status: He is alert.     GCS: GCS eye subscore is 4. GCS verbal subscore is 5. GCS motor subscore is 6.     Sensory: Sensation is intact. No sensory deficit.     Motor: Motor function is intact. No weakness.     Coordination: Coordination is intact.     Comments: CN II-XII intact.  Speech clear.  mentating well.  nnl finger nose testing.  No pronator drift     ED Results / Procedures / Treatments   Labs (all labs ordered are listed, but only abnormal results are displayed) Labs Reviewed  BASIC METABOLIC PANEL - Abnormal; Notable for the following components:      Result Value   Glucose, Bld 107 (*)    Calcium 8.8 (*)    All other components within normal limits  CBC - Abnormal; Notable for the following components:   RBC 3.49 (*)    Hemoglobin 11.5 (*)    HCT 34.8 (*)    All other components within normal limits  URINALYSIS, ROUTINE W REFLEX MICROSCOPIC - Abnormal; Notable for the following components:   APPearance HAZY (*)    Hgb urine dipstick MODERATE (*)    All other components within normal limits  RESP PANEL BY RT-PCR (FLU A&B, COVID) [id number]     EKG EKG Interpretation  Date/Time:  Wednesday May 28 2020 12:44:18 EDT Ventricular Rate:  57 PR Interval:  171 QRS Duration: 124 QT Interval:  453 QTC Calculation: 442 R Axis:   52 Text Interpretation: Sinus rhythm Right bundle branch block No significant change since last tracing Confirmed by Calvert Cantor [phone]) on 05/28/2020 12:52:58 PM   Radiology CT Head Wo Contrast  Result Date: 05/28/2020 CLINICAL DATA:  Dizziness. EXAM: CT HEAD WITHOUT CONTRAST TECHNIQUE: Contiguous axial images were obtained from the base of the skull through the vertex without intravenous contrast. COMPARISON:  MRI April 09, 2008. FINDINGS: Brain: No evidence of acute large vascular territory infarction, hemorrhage, hydrocephalus, extra-axial collection or mass lesion/mass effect. Calcification along the anterior falx. Mild for age patchy white matter hypoattenuation, most likely related to chronic microvascular ischemic disease. Vascular: No hyperdense vessel identified. Calcific atherosclerosis. Skull: No acute fracture. Sinuses/Orbits: Clear visualized sinuses.  Unremarkable orbits. Other: No mastoid effusions. IMPRESSION: No evidence of acute intracranial abnormality. Electronically Signed   By: Margaretha Sheffield MD   On: 05/28/2020 16:04   MR BRAIN WO CONTRAST  Result Date: 05/28/2020 CLINICAL DATA:  Dizziness. EXAM: MRI HEAD WITHOUT CONTRAST TECHNIQUE: Multiplanar, multiecho pulse sequences of the brain and surrounding structures were obtained without intravenous contrast. COMPARISON:  Head CT 05/28/2020 and MRI 04/09/2008 FINDINGS: Brain: There is no evidence of an acute infarct, mass, midline shift, or extra-axial fluid collection. Patchy T2 hyperintensities in the cerebral white matter bilaterally have progressed from 2010 and are nonspecific but compatible with mild-to-moderate chronic small vessel ischemic disease. The ventricles and sulci are within normal limits for  age. There are likely a few  chronic microhemorrhages in the left temporoparietal region. Vascular: Major intracranial vascular flow voids are preserved. Skull and upper cervical spine: Unremarkable bone marrow signal. Sinuses/Orbits: Bilateral cataract extraction. Paranasal sinuses and mastoid air cells are clear. Other: None. IMPRESSION: 1. No acute intracranial abnormality. 2. Mild-to-moderate chronic small vessel ischemic disease. Electronically Signed   By: Logan Bores M.D.   On: 05/28/2020 18:58    Procedures Procedures   Medications Ordered in ED Medications - No data to display  ED Course  I have reviewed the triage vital signs and the nursing notes.  Pertinent labs & imaging results that were available during my care of the patient were reviewed by me and considered in my medical decision making (see chart for details).    MDM Rules/Calculators/A&P                          Patient here with symptoms of nausea, dizziness and generalized weakness.  Symptoms present for 1 week.  Endorses difficulty standing and walking since 6 AM today.  No history of CVA.  On exam, patient well-appearing.  No focal neurologic deficits.  No slurred speech.  We will proceed with labs and CT head.  Discussed care plan with Dr. Karle Starch was also seen the patient.  CT head without acute intracranial finding, will proceed with MRI brain. Labs interpreted by me, no leukocytosis or significant electrolyte derangement.  Urinalysis without evidence of infection.  MRI also without evidence of acute intracranial abnormality.  No orthostatic hypotension.  Patient attempted to ambulate to the restroom, gait unsteady per nursing staff.  Source of patient's symptoms unclear at this time.  I feel that he will need admission.  Patient agreeable to plan.  consulted [facility] hospitalist and discussed findings.  Dr. Josephine Cables agrees to admit.   Final Clinical Impression(s) / ED Diagnoses Final diagnoses:  Dizziness    Rx / DC Orders ED  Discharge Orders    None       Bufford Lope 05/28/20 2327    Truddie Hidden, MD 05/29/20 1504

## 2020-05-28 NOTE — ED Notes (Signed)
Pt back from mri

## 2020-05-28 NOTE — ED Triage Notes (Signed)
Pt woke up this morning with dizziness, SOB, nausea, and generalized weakness. LKW 2300 yesterday.

## 2020-05-29 DIAGNOSIS — R42 Dizziness and giddiness: Secondary | ICD-10-CM | POA: Diagnosis not present

## 2020-05-29 LAB — COMPREHENSIVE METABOLIC PANEL
ALT: 19 U/L (ref 0–44)
AST: 20 U/L (ref 15–41)
Albumin: 3.7 g/dL (ref 3.5–5.0)
Alkaline Phosphatase: 64 U/L (ref 38–126)
Anion gap: 7 (ref 5–15)
BUN: 19 mg/dL (ref 8–23)
CO2: 28 mmol/L (ref 22–32)
Calcium: 8.7 mg/dL — ABNORMAL LOW (ref 8.9–10.3)
Chloride: 104 mmol/L (ref 98–111)
Creatinine, Ser: 1.06 mg/dL (ref 0.61–1.24)
GFR, Estimated: >60 mL/min (ref >60)
Glucose, Bld: 92 mg/dL (ref 70–99)
Potassium: 3.8 mmol/L (ref 3.5–5.1)
Sodium: 139 mmol/L (ref 135–145)
Total Bilirubin: 0.6 mg/dL (ref 0.3–1.2)
Total Protein: 6.6 g/dL (ref 6.5–8.1)

## 2020-05-29 LAB — CBC
HCT: 35 % — ABNORMAL LOW (ref 39.0–52.0)
Hemoglobin: 11.5 g/dL — ABNORMAL LOW (ref 13.0–17.0)
MCH: 32.8 pg (ref 26.0–34.0)
MCHC: 32.9 g/dL (ref 30.0–36.0)
MCV: 99.7 fL (ref 80.0–100.0)
Platelets: 180 10*3/uL (ref 150–400)
RBC: 3.51 MIL/uL — ABNORMAL LOW (ref 4.22–5.81)
RDW: 13.2 % (ref 11.5–15.5)
WBC: 5 10*3/uL (ref 4.0–10.5)
nRBC: 0 % (ref 0.0–0.2)

## 2020-05-29 LAB — APTT: aPTT: 22 seconds — ABNORMAL LOW (ref 24–36)

## 2020-05-29 LAB — PHOSPHORUS: Phosphorus: 3.8 mg/dL (ref 2.5–4.6)

## 2020-05-29 LAB — MAGNESIUM: Magnesium: 2.2 mg/dL (ref 1.7–2.4)

## 2020-05-29 LAB — PROTIME-INR
INR: 1 (ref 0.8–1.2)
Prothrombin Time: 13 seconds (ref 11.4–15.2)

## 2020-05-29 MED ORDER — MECLIZINE HCL 25 MG PO TABS: 25.0000 mg | ORAL_TABLET | Freq: Two times a day (BID) | ORAL | 0 refills | Status: DC | PRN

## 2020-05-29 NOTE — Evaluation (Signed)
Physical Therapy Evaluation Patient Details Name: [name] MRN: [id number] DOB: [birth year] Today's Date: 05/29/2020   History of Present Illness  [name] is a 75 y.o. male with medical history significant for CAD s/p CABG x 2 (LIMA -> LAD, RIMA -> RCA), GERD, hypertension, hyperlipidemia, hypothyroidism, cervicalgia who presents to the emergency department due to 3 to 4-day onset of dizziness which is associated with nausea without vomiting.  He complained of worsening symptoms this morning due to unsteady balance with swaying to one side and with near fainting sensation.  Patient states that he had to sit down and called a friend who checked his vital signs on his cell phone and noted that his O2at was in the 80s and HR in at 62/min, since he lives close to the hospital, his friend dropped him off at the ED for further evaluation.  Patient complained of sensation of a movement at the base of the skull which coincides with movement of his head as well as movement of his eyes with tendency to catch up with vision when he turns his head.  He endorsed intermittent pain on the right side of his neck near the base of his skull (patient has history of cervicalgia).  He stopped taking his Plavix and aspirin 2 to 3 days ago due to an upcoming surgical procedure.  Patient also complained of difficulty in being able to swallow which occurs mostly with dinner.  He denies chest pain, fever, chills, numbness and tingling.    Clinical Impression  Patient has mild c/o dizziness upon sitting up, put in Dix-Hallpike maneuver right and left without nystagmus noticed, c/o mild dizziness in both positions, right slightly worse than left.  Patient c/o difficulty for head keeping up with eyes when turning head resulting in dizziness.  Patient presents with severely limited neck extension and unable to extend past neutral with stiff end feel, also limited to 50% of left/right neck rotation.  Very difficult to  assess for canalith repositioning procedure due to stiffness/limited ROM in neck.  Recommend out patient PT for reassessment and possible vestibular rehab focused on habituation exercises.  Patient functioning at baseline and discharged from physical therapy to care of nursing for ambulation daily as tolerated for length of stay.    Follow Up Recommendations Outpatient PT;Other (comment) (vestibular rehab)    Equipment Recommendations  3in1 (PT);Other (comment) (shower chair)    Recommendations for Other Services       Precautions / Restrictions Precautions Precautions: None Restrictions Weight Bearing Restrictions: No      Mobility  Bed Mobility Overal bed mobility: Modified Independent             General bed mobility comments: increased time, labored movement with bed flat    Transfers Overall transfer level: Modified independent                  Ambulation/Gait Ambulation/Gait assistance: Modified independent (Device/Increase time) Gait Distance (Feet): 200 Feet Assistive device: None Gait Pattern/deviations: WFL(Within Functional Limits) Gait velocity: slightly decreased   General Gait Details: demonstrates good return for ambulation on level, inclined and declined surfaces without loss of balance  Stairs Stairs: Yes Stairs assistance: Modified independent (Device/Increase time) Stair Management: One rail Right;One rail Left;Alternating pattern Number of Stairs: 10 General stair comments: demonstrates good return for going up/down steps using 1 siderail without loss of balance  Wheelchair Mobility    Modified Rankin (Stroke Patients Only)       Balance Overall balance  assessment: No apparent balance deficits (not formally assessed)                                           Pertinent Vitals/Pain Pain Assessment: 0-10 Pain Score: 3  Pain Location: posterior neck Pain Descriptors / Indicators: Sore Pain Intervention(s):  Limited activity within patient's tolerance;Monitored during session;Repositioned    Home Living Family/patient expects to be discharged to:: Private residence Living Arrangements: Alone Available Help at Discharge: Friend(s);Available PRN/intermittently Type of Home: House Home Access: Stairs to enter Entrance Stairs-Rails: Left Entrance Stairs-Number of Steps: 2 Home Layout: Two level;Able to live on main level with bedroom/bathroom;Full bath on main level Home Equipment: None      Prior Function Level of Independence: Independent         Comments: community ambulator, drives     Hand Dominance        Extremity/Trunk Assessment   Upper Extremity Assessment Upper Extremity Assessment: Overall WFL for tasks assessed    Lower Extremity Assessment Lower Extremity Assessment: Overall WFL for tasks assessed    Cervical / Trunk Assessment Cervical / Trunk Assessment: Other exceptions Cervical / Trunk Exceptions: limited neck extension, unable to extend past neutral, limited neck rotation left and right  Communication   Communication: No difficulties  Cognition Arousal/Alertness: Awake/alert Behavior During Therapy: WFL for tasks assessed/performed Overall Cognitive Status: Within Functional Limits for tasks assessed                                        General Comments      Exercises     Assessment/Plan    PT Assessment All further PT needs can be met in the next venue of care  PT Problem List Decreased range of motion;Decreased mobility;Decreased strength;Decreased activity tolerance       PT Treatment Interventions      PT Goals (Current goals can be found in the Care Plan section)  Acute Rehab PT Goals Patient Stated Goal: return home with less dizziness PT Goal Formulation: With patient Time For Goal Achievement: 05/29/20 Potential to Achieve Goals: Good    Frequency     Barriers to discharge        Co-evaluation                AM-PAC PT "6 Clicks" Mobility  Outcome Measure Help needed turning from your back to your side while in a flat bed without using bedrails?: None Help needed moving from lying on your back to sitting on the side of a flat bed without using bedrails?: A Little Help needed moving to and from a bed to a chair (including a wheelchair)?: None Help needed standing up from a chair using your arms (e.g., wheelchair or bedside chair)?: None Help needed to walk in hospital room?: None Help needed climbing 3-5 steps with a railing? : None 6 Click Score: 23    End of Session   Activity Tolerance: Patient tolerated treatment well Patient left: in bed Nurse Communication: Mobility status PT Visit Diagnosis: Dizziness and giddiness (R42);Difficulty in walking, not elsewhere classified (R26.2);Unsteadiness on feet (R26.81)    Time: 2423-5361 PT Time Calculation (min) (ACUTE ONLY): 27 min   Charges:   PT Evaluation $PT Eval Moderate Complexity: 1 Mod PT Treatments $Therapeutic Activity: 23-37 mins  2:44 PM, 05/29/20 Lonell Grandchild, MPT Physical Therapist with [facility] [phone] office [phone] mobile phone

## 2020-05-29 NOTE — Discharge Summary (Signed)
Physician Discharge Summary  Serge Main [facility] IOE:[id number] DOB: May 24, 1945 DOA: 05/28/2020  PCP: Sharilyn Sites, MD  Admit date: 05/28/2020 Discharge date: 05/29/2020  Admitted From: Home Disposition:  Home  Recommendations for Outpatient Follow-up:  1. Follow up with PCP in 1-2 weeks 2. Please obtain BMP/CBC in one week  Home Health: Outpatient PT and vestibular therapy specifically Equipment/Devices: None recommended  Discharge Condition:Stable  CODE STATUS:Full  Diet recommendation: Low salt, low fat    Brief/Interim Summary: Kathan Kirker Howertonis a 75 y.o.malewith medical history significant forCADs/pCABGx 2 (LIMA -> LAD, RIMA -> RCA), GERD, hypertension, hyperlipidemia, hypothyroidism, cervicalgia who presents to the emergency department due to 3 to 4-day onset of dizziness which is associated with nausea without vomiting. He complained of worsening symptoms this morning due to unsteady balance with swaying to one side and with near fainting sensation.Patient states that he had to sit down and called a friend who checked his vital signs on his cell phone and noted that hisO2at was in the 80s and HR in at 62/min,since he lives close to the hospital, his friend dropped him off at the ED for further evaluation. Patient complained of sensation of amovement at the base of the skull which coincides with movement of his head as well as movement of his eyes with tendency to catch up with vision whenhe turns his head. He endorsed intermittent pain on the right side of his neck near the base of his skull (patient has history of cervicalgia). He stopped taking his Plavix and aspirin 2 to 3 days ago due to an upcoming surgical procedure. Patient also complainedof difficulty in being able to swallow which occursmostly with dinner. He denies chest pain, fever, chills, numbness and tingling  Patient admitted as above with acute onset dizziness and nystagmus which appears to be  positional consistent with vertigo.  He presented to the ED given acute onset and concern for other more insidious diagnoses, fortunately patient's imaging including CT head and MRI of the brain all without any acute findings.  Further evaluation with PT recommend further vestibular physical therapy in the outpatient setting, given patient's negative imaging and otherwise what appears to be simple vestibular dysfunction he is otherwise stable and agreeable for discharge home.    Discharge Diagnoses:  Principal Problem:   Dizziness Active Problems:   Hypothyroidism   Hyperlipidemia   Essential hypertension   Coronary artery disease   Esophageal dysphagia   Cervicalgia   GERD (gastroesophageal reflux disease)   Obstructive sleep apnea on CPAP    Discharge Instructions  Discharge Instructions    Call MD for:  persistant nausea and vomiting   Complete by: As directed    Diet - low sodium heart healthy   Complete by: As directed    Increase activity slowly   Complete by: As directed      Allergies as of 05/29/2020      Reactions   Codeine Nausea Only   Pollen Extract Other (See Comments)   Seasonal      Medication List    TAKE these medications   amLODipine 10 MG tablet Commonly known as: NORVASC Take 1 tablet by mouth daily.   aspirin 81 MG tablet Take 1 tablet (81 mg total) by mouth at bedtime.   atorvastatin 40 MG tablet Commonly known as: LIPITOR Take 1 tablet (40 mg total) by mouth daily. Please call and schedule annual appointment for continued refills.   Biotin 5000 MCG Caps Take 5,000 mcg by mouth daily.  celecoxib 200 MG capsule Commonly known as: CELEBREX Take 1 capsule (200 mg total) by mouth daily.   cetirizine 10 MG tablet Commonly known as: ZYRTEC Take 10 mg by mouth daily.   Cholecalciferol 125 MCG (5000 UT) Tabs Take 1 tablet by mouth daily.   clopidogrel 75 MG tablet Commonly known as: PLAVIX Take 1 tablet (75 mg total) by mouth in the  morning. Please make overdue appt with Dr. Burt Knack before anymore refills. Thank you 1st attempt   cyanocobalamin 1000 MCG/ML injection Commonly known as: (VITAMIN B-12) Inject 1,000 mcg into the muscle See admin instructions. Monthly as needed   Fish Oil 1200 MG Caps Take 1,200 mg by mouth daily.   gabapentin 600 MG tablet Commonly known as: NEURONTIN Take 600 mg by mouth 3 (three) times daily.   glucosamine-chondroitin 500-400 MG tablet Take 1 tablet by mouth daily.   isosorbide mononitrate 60 MG 24 hr tablet Commonly known as: IMDUR TAKE 1 TABLET BY MOUTH DAILY   levothyroxine 150 MCG tablet Commonly known as: SYNTHROID Take 150 mcg by mouth daily before breakfast.   meclizine 25 MG tablet Commonly known as: ANTIVERT Take 1 tablet (25 mg total) by mouth 2 (two) times daily as needed for dizziness.   multivitamin tablet Take 1 tablet by mouth every other day.   nitroGLYCERIN 0.4 MG SL tablet Commonly known as: NITROSTAT Place 1 tablet (0.4 mg total) under the tongue every 5 (five) minutes as needed for chest pain. Please make appt with Dr. Burt Knack.1st attempt   Potassium Gluconate 2.5 MEQ Tabs Take 2 tablets by mouth at bedtime.   REFRESH OP Place 1 drop into both eyes daily as needed (dry eyes).   traMADol 50 MG tablet Commonly known as: ULTRAM Take 50 mg by mouth 3 (three) times daily.   Vitamin B Complex Tabs Take 1 tablet by mouth daily.       Follow-up Information    [facility]. Call in 1 day(s).   Why: Call [phone] for follow up. Location is [place], [place], [place] [address]             Allergies  Allergen Reactions  . Codeine Nausea Only  . Pollen Extract Other (See Comments)    Seasonal    Consultations: None  Procedures/Studies: CT Head Wo Contrast  Result Date: 05/28/2020 CLINICAL DATA:  Dizziness. EXAM: CT HEAD WITHOUT CONTRAST TECHNIQUE: Contiguous axial images were obtained from  the base of the skull through the vertex without intravenous contrast. COMPARISON:  MRI April 09, 2008. FINDINGS: Brain: No evidence of acute large vascular territory infarction, hemorrhage, hydrocephalus, extra-axial collection or mass lesion/mass effect. Calcification along the anterior falx. Mild for age patchy white matter hypoattenuation, most likely related to chronic microvascular ischemic disease. Vascular: No hyperdense vessel identified. Calcific atherosclerosis. Skull: No acute fracture. Sinuses/Orbits: Clear visualized sinuses.  Unremarkable orbits. Other: No mastoid effusions. IMPRESSION: No evidence of acute intracranial abnormality. Electronically Signed   By: Margaretha Sheffield MD   On: 05/28/2020 16:04   MR BRAIN WO CONTRAST  Result Date: 05/28/2020 CLINICAL DATA:  Dizziness. EXAM: MRI HEAD WITHOUT CONTRAST TECHNIQUE: Multiplanar, multiecho pulse sequences of the brain and surrounding structures were obtained without intravenous contrast. COMPARISON:  Head CT 05/28/2020 and MRI 04/09/2008 FINDINGS: Brain: There is no evidence of an acute infarct, mass, midline shift, or extra-axial fluid collection. Patchy T2 hyperintensities in the cerebral white matter bilaterally have progressed from 2010 and are nonspecific but compatible  with mild-to-moderate chronic small vessel ischemic disease. The ventricles and sulci are within normal limits for age. There are likely a few chronic microhemorrhages in the left temporoparietal region. Vascular: Major intracranial vascular flow voids are preserved. Skull and upper cervical spine: Unremarkable bone marrow signal. Sinuses/Orbits: Bilateral cataract extraction. Paranasal sinuses and mastoid air cells are clear. Other: None. IMPRESSION: 1. No acute intracranial abnormality. 2. Mild-to-moderate chronic small vessel ischemic disease. Electronically Signed   By: Logan Bores M.D.   On: 05/28/2020 18:58   DG Chest Portable 1 View  Result Date:  05/28/2020 CLINICAL DATA:  Dizziness EXAM: PORTABLE CHEST 1 VIEW COMPARISON:  09/07/2016 FINDINGS: Post sternotomy changes. Coronary stent. Linear atelectasis or scarring at the left base. No consolidation or effusion. Normal cardiomediastinal silhouette. No pneumothorax. IMPRESSION: No active disease.  Scarring or atelectasis left base Electronically Signed   By: Donavan Foil M.D.   On: 05/28/2020 20:14     Subjective: No acute issues or events overnight, dizziness and vision changes appear to be resolving overnight although not yet back to baseline   Discharge Exam: Vitals:   05/29/20 0804 05/29/20 1403  BP: (!) 153/73 110/74  Pulse:  61  Resp:    Temp:  98 F (36.7 C)  SpO2:  99%   Vitals:   05/29/20 0007 05/29/20 0431 05/29/20 0804 05/29/20 1403  BP:  (!) 149/71 (!) 153/73 110/74  Pulse:  (!) 56  61  Resp:  18    Temp:  97.8 F (36.6 C)  98 F (36.7 C)  TempSrc:  Oral  Oral  SpO2: 92% 95%  99%  Weight:      Height:        General: Pt is alert, awake, not in acute distress Cardiovascular: RRR, S1/S2 +, no rubs, no gallops Respiratory: CTA bilaterally, no wheezing, no rhonchi Abdominal: Soft, NT, ND, bowel sounds + Extremities: no edema, no cyanosis    The results of significant diagnostics from this hospitalization (including imaging, microbiology, ancillary and laboratory) are listed below for reference.     Microbiology: Recent Results (from the past 240 hour(s))  Resp Panel by RT-PCR (Flu A&B, Covid) Nasopharyngeal Swab     Status: None   Collection Time: 05/28/20  7:48 PM   Specimen: Nasopharyngeal Swab; Nasopharyngeal(NP) swabs in vial transport medium  Result Value Ref Range Status   SARS Coronavirus 2 by RT PCR NEGATIVE NEGATIVE Final    Comment: (NOTE) SARS-CoV-2 target nucleic acids are NOT DETECTED.  The SARS-CoV-2 RNA is generally detectable in upper respiratory specimens during the acute phase of infection. The lowest concentration of SARS-CoV-2  viral copies this assay can detect is 138 copies/mL. A negative result does not preclude SARS-Cov-2 infection and should not be used as the sole basis for treatment or other patient management decisions. A negative result may occur with  improper specimen collection/handling, submission of specimen other than nasopharyngeal swab, presence of viral mutation(s) within the areas targeted by this assay, and inadequate number of viral copies(<138 copies/mL). A negative result must be combined with clinical observations, patient history, and epidemiological information. The expected result is Negative.  Fact Sheet for Patients:  EntrepreneurPulse.com.au  Fact Sheet for Healthcare Providers:  IncredibleEmployment.be  This test is no t yet approved or cleared by the [place] FDA and  has been authorized for detection and/or diagnosis of SARS-CoV-2 by FDA under an Emergency Use Authorization (EUA). This EUA will remain  in effect (meaning this test can be used) for the  duration of the COVID-19 declaration under Section 564(b)(1) of the Act, 21 U.S.C.section 360bbb-3(b)(1), unless the authorization is terminated  or revoked sooner.       Influenza A by PCR NEGATIVE NEGATIVE Final   Influenza B by PCR NEGATIVE NEGATIVE Final    Comment: (NOTE) The Xpert Xpress SARS-CoV-2/FLU/RSV plus assay is intended as an aid in the diagnosis of influenza from Nasopharyngeal swab specimens and should not be used as a sole basis for treatment. Nasal washings and aspirates are unacceptable for Xpert Xpress SARS-CoV-2/FLU/RSV testing.  Fact Sheet for Patients: EntrepreneurPulse.com.au  Fact Sheet for Healthcare Providers: IncredibleEmployment.be  This test is not yet approved or cleared by the [place] FDA and has been authorized for detection and/or diagnosis of SARS-CoV-2 by FDA under an Emergency Use Authorization  (EUA). This EUA will remain in effect (meaning this test can be used) for the duration of the COVID-19 declaration under Section 564(b)(1) of the Act, 21 U.S.C. section 360bbb-3(b)(1), unless the authorization is terminated or revoked.  Performed at [facility], [address] [place], [place] [address]      Labs: BNP (last 3 results) No results for input(s): BNP in the last 8760 hours. Basic Metabolic Panel: Recent Labs  Lab 05/28/20 1436 05/29/20 0551  NA 136 139  K 3.8 3.8  CL 103 104  CO2 25 28  GLUCOSE 107* 92  BUN 22 19  CREATININE 1.20 1.06  CALCIUM 8.8* 8.7*  MG  --  2.2  PHOS  --  3.8   Liver Function Tests: Recent Labs  Lab 05/29/20 0551  AST 20  ALT 19  ALKPHOS 64  BILITOT 0.6  PROT 6.6  ALBUMIN 3.7   No results for input(s): LIPASE, AMYLASE in the last 168 hours. No results for input(s): AMMONIA in the last 168 hours. CBC: Recent Labs  Lab 05/28/20 1436 05/29/20 0551  WBC 5.3 5.0  HGB 11.5* 11.5*  HCT 34.8* 35.0*  MCV 99.7 99.7  PLT 185 180   Cardiac Enzymes: No results for input(s): CKTOTAL, CKMB, CKMBINDEX, TROPONINI in the last 168 hours. BNP: Invalid input(s): POCBNP CBG: No results for input(s): GLUCAP in the last 168 hours. D-Dimer No results for input(s): DDIMER in the last 72 hours. Hgb A1c No results for input(s): HGBA1C in the last 72 hours. Lipid Profile No results for input(s): CHOL, HDL, LDLCALC, TRIG, CHOLHDL, LDLDIRECT in the last 72 hours. Thyroid function studies No results for input(s): TSH, T4TOTAL, T3FREE, THYROIDAB in the last 72 hours.  Invalid input(s): FREET3 Anemia work up No results for input(s): VITAMINB12, FOLATE, FERRITIN, TIBC, IRON, RETICCTPCT in the last 72 hours. Urinalysis    Component Value Date/Time   COLORURINE YELLOW 05/28/2020 1531   APPEARANCEUR HAZY (A) 05/28/2020 1531   LABSPEC 1.014 05/28/2020 1531   PHURINE 6.0 05/28/2020 1531   GLUCOSEU NEGATIVE 05/28/2020 1531   HGBUR MODERATE (A)  05/28/2020 1531   [place] 05/28/2020 1531   KETONESUR NEGATIVE 05/28/2020 1531   PROTEINUR NEGATIVE 05/28/2020 1531   NITRITE NEGATIVE 05/28/2020 1531   LEUKOCYTESUR NEGATIVE 05/28/2020 1531   Sepsis Labs Invalid input(s): PROCALCITONIN,  WBC,  LACTICIDVEN Microbiology Recent Results (from the past 240 hour(s))  Resp Panel by RT-PCR (Flu A&B, Covid) Nasopharyngeal Swab     Status: None   Collection Time: 05/28/20  7:48 PM   Specimen: Nasopharyngeal Swab; Nasopharyngeal(NP) swabs in vial transport medium  Result Value Ref Range Status   SARS Coronavirus 2 by RT PCR NEGATIVE NEGATIVE Final  Comment: (NOTE) SARS-CoV-2 target nucleic acids are NOT DETECTED.  The SARS-CoV-2 RNA is generally detectable in upper respiratory specimens during the acute phase of infection. The lowest concentration of SARS-CoV-2 viral copies this assay can detect is 138 copies/mL. A negative result does not preclude SARS-Cov-2 infection and should not be used as the sole basis for treatment or other patient management decisions. A negative result may occur with  improper specimen collection/handling, submission of specimen other than nasopharyngeal swab, presence of viral mutation(s) within the areas targeted by this assay, and inadequate number of viral copies(<138 copies/mL). A negative result must be combined with clinical observations, patient history, and epidemiological information. The expected result is Negative.  Fact Sheet for Patients:  EntrepreneurPulse.com.au  Fact Sheet for Healthcare Providers:  IncredibleEmployment.be  This test is no t yet approved or cleared by the [place] FDA and  has been authorized for detection and/or diagnosis of SARS-CoV-2 by FDA under an Emergency Use Authorization (EUA). This EUA will remain  in effect (meaning this test can be used) for the duration of the COVID-19 declaration under Section 564(b)(1) of  the Act, 21 U.S.C.section 360bbb-3(b)(1), unless the authorization is terminated  or revoked sooner.       Influenza A by PCR NEGATIVE NEGATIVE Final   Influenza B by PCR NEGATIVE NEGATIVE Final    Comment: (NOTE) The Xpert Xpress SARS-CoV-2/FLU/RSV plus assay is intended as an aid in the diagnosis of influenza from Nasopharyngeal swab specimens and should not be used as a sole basis for treatment. Nasal washings and aspirates are unacceptable for Xpert Xpress SARS-CoV-2/FLU/RSV testing.  Fact Sheet for Patients: EntrepreneurPulse.com.au  Fact Sheet for Healthcare Providers: IncredibleEmployment.be  This test is not yet approved or cleared by the [place] FDA and has been authorized for detection and/or diagnosis of SARS-CoV-2 by FDA under an Emergency Use Authorization (EUA). This EUA will remain in effect (meaning this test can be used) for the duration of the COVID-19 declaration under Section 564(b)(1) of the Act, 21 U.S.C. section 360bbb-3(b)(1), unless the authorization is terminated or revoked.  Performed at [facility], [address] [place], [place] [address]      Time coordinating discharge: Over 30 minutes  SIGNED:   Little Ishikawa, DO [facility] 05/29/2020, 4:34 PM Pager   If 7PM-7AM, please contact night-coverage [email or web address]

## 2020-06-01 ENCOUNTER — Other Ambulatory Visit: Payer: Self-pay | Admitting: Cardiovascular Disease

## 2020-06-02 ENCOUNTER — Other Ambulatory Visit: Payer: Self-pay | Admitting: Cardiovascular Disease

## 2020-06-03 DIAGNOSIS — Z87891 Personal history of nicotine dependence: Secondary | ICD-10-CM | POA: Diagnosis not present

## 2020-06-03 DIAGNOSIS — C672 Malignant neoplasm of lateral wall of bladder: Secondary | ICD-10-CM | POA: Diagnosis not present

## 2020-06-03 DIAGNOSIS — Z79899 Other long term (current) drug therapy: Secondary | ICD-10-CM | POA: Diagnosis not present

## 2020-06-03 DIAGNOSIS — I2510 Atherosclerotic heart disease of native coronary artery without angina pectoris: Secondary | ICD-10-CM | POA: Diagnosis not present

## 2020-06-03 DIAGNOSIS — C679 Malignant neoplasm of bladder, unspecified: Secondary | ICD-10-CM | POA: Diagnosis not present

## 2020-06-03 DIAGNOSIS — Z9989 Dependence on other enabling machines and devices: Secondary | ICD-10-CM | POA: Diagnosis not present

## 2020-06-03 DIAGNOSIS — E785 Hyperlipidemia, unspecified: Secondary | ICD-10-CM | POA: Diagnosis not present

## 2020-06-03 DIAGNOSIS — I10 Essential (primary) hypertension: Secondary | ICD-10-CM | POA: Diagnosis not present

## 2020-06-03 DIAGNOSIS — Z951 Presence of aortocoronary bypass graft: Secondary | ICD-10-CM | POA: Diagnosis not present

## 2020-06-03 DIAGNOSIS — E039 Hypothyroidism, unspecified: Secondary | ICD-10-CM | POA: Diagnosis not present

## 2020-06-03 DIAGNOSIS — I4510 Unspecified right bundle-branch block: Secondary | ICD-10-CM | POA: Diagnosis not present

## 2020-06-03 DIAGNOSIS — Z955 Presence of coronary angioplasty implant and graft: Secondary | ICD-10-CM | POA: Diagnosis not present

## 2020-06-03 DIAGNOSIS — G4733 Obstructive sleep apnea (adult) (pediatric): Secondary | ICD-10-CM | POA: Diagnosis not present

## 2020-06-03 DIAGNOSIS — R9431 Abnormal electrocardiogram [ECG] [EKG]: Secondary | ICD-10-CM | POA: Diagnosis not present

## 2020-06-04 DIAGNOSIS — E785 Hyperlipidemia, unspecified: Secondary | ICD-10-CM | POA: Diagnosis not present

## 2020-06-04 DIAGNOSIS — Z955 Presence of coronary angioplasty implant and graft: Secondary | ICD-10-CM | POA: Diagnosis not present

## 2020-06-04 DIAGNOSIS — Z951 Presence of aortocoronary bypass graft: Secondary | ICD-10-CM | POA: Diagnosis not present

## 2020-06-04 DIAGNOSIS — I2510 Atherosclerotic heart disease of native coronary artery without angina pectoris: Secondary | ICD-10-CM | POA: Diagnosis not present

## 2020-06-04 DIAGNOSIS — R9431 Abnormal electrocardiogram [ECG] [EKG]: Secondary | ICD-10-CM | POA: Diagnosis not present

## 2020-06-04 DIAGNOSIS — G4733 Obstructive sleep apnea (adult) (pediatric): Secondary | ICD-10-CM | POA: Diagnosis not present

## 2020-06-04 DIAGNOSIS — E039 Hypothyroidism, unspecified: Secondary | ICD-10-CM | POA: Diagnosis not present

## 2020-06-04 DIAGNOSIS — I4510 Unspecified right bundle-branch block: Secondary | ICD-10-CM | POA: Diagnosis not present

## 2020-06-04 DIAGNOSIS — C672 Malignant neoplasm of lateral wall of bladder: Secondary | ICD-10-CM | POA: Diagnosis not present

## 2020-06-04 DIAGNOSIS — Z9989 Dependence on other enabling machines and devices: Secondary | ICD-10-CM | POA: Diagnosis not present

## 2020-06-04 DIAGNOSIS — I10 Essential (primary) hypertension: Secondary | ICD-10-CM | POA: Diagnosis not present

## 2020-06-04 DIAGNOSIS — Z87891 Personal history of nicotine dependence: Secondary | ICD-10-CM | POA: Diagnosis not present

## 2020-06-04 DIAGNOSIS — Z79899 Other long term (current) drug therapy: Secondary | ICD-10-CM | POA: Diagnosis not present

## 2020-06-05 ENCOUNTER — Other Ambulatory Visit: Payer: Self-pay | Admitting: Cardiovascular Disease

## 2020-06-06 ENCOUNTER — Telehealth: Payer: Self-pay | Admitting: Cardiovascular Disease

## 2020-06-06 ENCOUNTER — Other Ambulatory Visit: Payer: Self-pay | Admitting: Cardiovascular Disease

## 2020-06-06 NOTE — Telephone Encounter (Signed)
New Message:      Loma Sousa from Dr Forestine Chute office wants to know if Dr Burt Knack is the one who put pt on Clopidogrel?

## 2020-06-06 NOTE — Telephone Encounter (Signed)
RN returned call to [facility] at Dr. Forestine Chute office regarding information needed. Loma Sousa states they were able to receive all the needed information at this time from PCP. Loma Sousa appreciative of return phone call.

## 2020-06-16 DIAGNOSIS — G629 Polyneuropathy, unspecified: Secondary | ICD-10-CM | POA: Diagnosis not present

## 2020-06-16 DIAGNOSIS — Z1389 Encounter for screening for other disorder: Secondary | ICD-10-CM | POA: Diagnosis not present

## 2020-06-16 DIAGNOSIS — R55 Syncope and collapse: Secondary | ICD-10-CM | POA: Diagnosis not present

## 2020-06-16 DIAGNOSIS — R7309 Other abnormal glucose: Secondary | ICD-10-CM | POA: Diagnosis not present

## 2020-06-16 DIAGNOSIS — I25810 Atherosclerosis of coronary artery bypass graft(s) without angina pectoris: Secondary | ICD-10-CM | POA: Diagnosis not present

## 2020-06-16 DIAGNOSIS — E039 Hypothyroidism, unspecified: Secondary | ICD-10-CM | POA: Diagnosis not present

## 2020-06-16 DIAGNOSIS — G4733 Obstructive sleep apnea (adult) (pediatric): Secondary | ICD-10-CM | POA: Diagnosis not present

## 2020-06-17 ENCOUNTER — Emergency Department (HOSPITAL_COMMUNITY)
Admission: EM | Admit: 2020-06-17 | Discharge: 2020-06-17 | Disposition: A | Discharge status: Home / Self Care | Payer: Medicare Other | Source: Home / Self Care | Attending: Emergency Medicine | Admitting: Emergency Medicine

## 2020-06-17 ENCOUNTER — Emergency Department (HOSPITAL_COMMUNITY)
Admission: EM | Admit: 2020-06-17 | Discharge: 2020-06-17 | Disposition: A | Discharge status: Home / Self Care | Payer: Medicare Other | Attending: Emergency Medicine | Admitting: Emergency Medicine

## 2020-06-17 ENCOUNTER — Encounter (HOSPITAL_COMMUNITY): Payer: Self-pay | Admitting: *Deleted

## 2020-06-17 ENCOUNTER — Encounter (HOSPITAL_COMMUNITY): Payer: Self-pay | Admitting: Emergency Medicine

## 2020-06-17 ENCOUNTER — Other Ambulatory Visit: Payer: Self-pay

## 2020-06-17 DIAGNOSIS — I2510 Atherosclerotic heart disease of native coronary artery without angina pectoris: Secondary | ICD-10-CM | POA: Insufficient documentation

## 2020-06-17 DIAGNOSIS — R339 Retention of urine, unspecified: Secondary | ICD-10-CM

## 2020-06-17 DIAGNOSIS — R1030 Lower abdominal pain, unspecified: Secondary | ICD-10-CM | POA: Insufficient documentation

## 2020-06-17 DIAGNOSIS — Z87891 Personal history of nicotine dependence: Secondary | ICD-10-CM | POA: Insufficient documentation

## 2020-06-17 DIAGNOSIS — Z8546 Personal history of malignant neoplasm of prostate: Secondary | ICD-10-CM | POA: Insufficient documentation

## 2020-06-17 DIAGNOSIS — Z8551 Personal history of malignant neoplasm of bladder: Secondary | ICD-10-CM | POA: Insufficient documentation

## 2020-06-17 DIAGNOSIS — Z7982 Long term (current) use of aspirin: Secondary | ICD-10-CM | POA: Insufficient documentation

## 2020-06-17 DIAGNOSIS — Z79899 Other long term (current) drug therapy: Secondary | ICD-10-CM | POA: Insufficient documentation

## 2020-06-17 DIAGNOSIS — Z7902 Long term (current) use of antithrombotics/antiplatelets: Secondary | ICD-10-CM | POA: Insufficient documentation

## 2020-06-17 DIAGNOSIS — E039 Hypothyroidism, unspecified: Secondary | ICD-10-CM | POA: Insufficient documentation

## 2020-06-17 DIAGNOSIS — I10 Essential (primary) hypertension: Secondary | ICD-10-CM | POA: Insufficient documentation

## 2020-06-17 DIAGNOSIS — R350 Frequency of micturition: Secondary | ICD-10-CM | POA: Insufficient documentation

## 2020-06-17 DIAGNOSIS — R310 Gross hematuria: Secondary | ICD-10-CM | POA: Insufficient documentation

## 2020-06-17 DIAGNOSIS — R319 Hematuria, unspecified: Secondary | ICD-10-CM | POA: Insufficient documentation

## 2020-06-17 DIAGNOSIS — Z951 Presence of aortocoronary bypass graft: Secondary | ICD-10-CM | POA: Insufficient documentation

## 2020-06-17 LAB — URINALYSIS, ROUTINE W REFLEX MICROSCOPIC

## 2020-06-17 LAB — URINALYSIS, MICROSCOPIC (REFLEX)
RBC / HPF: >50 RBC/hpf (ref 0–5)
Squamous Epithelial / HPF: NONE SEEN (ref 0–5)

## 2020-06-17 NOTE — ED Notes (Signed)
Attempted to irrigate foley. Several medium size clots returned. Fluid became less red and then no fluid return after irrigation. Unable to aspirate sterile water used to irrigate foley. EDP made aware.

## 2020-06-17 NOTE — ED Provider Notes (Signed)
[facility] EMERGENCY DEPARTMENT Provider Note   CSN: [id number] Arrival date & time: 06/17/20  0024     History Chief Complaint  Patient presents with  . Urinary Retention    [name] is a 75 y.o. male.  HPI    Patient presents for difficulty urinating. Patient reports he underwent a trans-urethral resection of the bladder on May 24 in the [facility] system No complications, but he did have some hematuria intermittently.  Over the past day has had increasing hematuria and now is having difficulty passing urine.  No fevers or vomiting. He is now having lower abdominal pain because he cannot urinate.  No other acute complaints.  He is on Plavix Past Medical History:  Diagnosis Date  . Anginal pain ([place])   . Arthritis    "all over"  . Bladder tumor   . Chronic lower back pain   . Coronary artery disease    a. s/p CABG x 2 (LIMA->LAD, RIMA->RCA);  b. s/p multiple PCI's to Ramus;  c. 08/2011 Cath/PCI: LM 70% into ramus with 70-80% there->treated wtih 3.5x18 Xience Xpedition DES, LCX  nonobs, RCA occluded.  RIMA & LIMA patent, EF 55-65%  . DJD (degenerative joint disease)   . GERD (gastroesophageal reflux disease)   . History of gout   . Hyperlipidemia   . Hypertension   . Hypothyroidism   . Neuropathy   . OSA on CPAP   . Prostate cancer [facility])     Patient Active Problem List   Diagnosis Date Noted  . Dizziness 05/28/2020  . Cervicalgia 05/28/2020  . GERD (gastroesophageal reflux disease) 05/28/2020  . Obstructive sleep apnea on CPAP 05/28/2020  . Esophageal dysphagia 05/04/2017  . Chest pain 09/08/2016  . Dysphagia 09/22/2015  . Exertional angina ([place]) 04/10/2015  . Abnormal nuclear cardiac imaging test 04/08/2015  . Aortic stenosis 09/24/2011  . Unstable angina ([place]) 09/09/2011  . Murmur 04/19/2011  . Coronary artery disease 12/08/2010  . Hypothyroidism 09/28/2008  . Hyperlipidemia 09/28/2008  . Essential hypertension 09/28/2008  . CAD, ARTERY BYPASS  GRAFT 09/28/2008  . DEGENERATIVE JOINT DISEASE 09/28/2008  . BACK PAIN, CHRONIC 09/28/2008    Past Surgical History:  Procedure Laterality Date  . ABDOMINAL HERNIA REPAIR    . BIOPSY  11/26/2015   Procedure: BIOPSY;  Surgeon: Rogene Houston, MD;  Location: [facility] ENDO SU[facility];  Service: Endoscopy;;  gastric esophagus  . BLADDER TUMOR EXCISION    . BROW LIFT Bilateral 04/27/2019   Procedure: BILATERAL BLEPHAROPLASTY;  Surgeon: Baruch Goldmann, MD;  Location: [facility] ORS;  Service: Ophthalmology;  Laterality: Bilateral;  . CARDIAC CATHETERIZATION N/A 04/10/2015   Procedure: Left Heart Cath and Cors/Grafts Angiography;  Surgeon: Sherren Mocha, MD;  Location: [place] CV LAB;  Service: Cardiovascular;  Laterality: N/A;  . CARDIAC CATHETERIZATION N/A 04/10/2015   Procedure: Coronary Stent Intervention;  Surgeon: Sherren Mocha, MD;  Location: [place] CV LAB;  Service: Cardiovascular;  Laterality: N/A;  . CATARACT EXTRACTION W/PHACO Right 06/23/2015   Procedure: CATARACT EXTRACTION PHACO AND INTRAOCULAR LENS PLACEMENT RIGHT EYE; CDE:  7.08;  Surgeon: Tonny Branch, MD;  Location: [facility] ORS;  Service: Ophthalmology;  Laterality: Right;  . CATARACT EXTRACTION W/PHACO Left 08/25/2015   Procedure: CATARACT EXTRACTION PHACO AND INTRAOCULAR LENS PLACEMENT LEFT EYE; CDE:  8.18;  Surgeon: Tonny Branch, MD;  Location: [facility] ORS;  Service: Ophthalmology;  Laterality: Left;  . COLONOSCOPY N/A 12/28/2012   Procedure: COLONOSCOPY;  Surgeon: Rogene Houston, MD;  Location: [facility] ENDO SUITE;  Service: Endoscopy;  Laterality: N/A;  830 rescheduled  . CORONARY ANGIOPLASTY WITH STENT PLACEMENT  2013; 04/10/2015   "this makes me a total of 7" (04/10/2015)  . CORONARY ARTERY BYPASS GRAFT  1997   with (LIMA)  . ESOPHAGEAL DILATION N/A 11/26/2015   Procedure: ESOPHAGEAL DILATION;  Surgeon: Rogene Houston, MD;  Location: [facility];  Service: Endoscopy;  Laterality: N/A;  . ESOPHAGEAL DILATION N/A 05/23/2017   Procedure: ESOPHAGEAL  DILATION;  Surgeon: Rogene Houston, MD;  [facility];  Service: Endoscopy;  Laterality: N/A;  . ESOPHAGEAL DILATION N/A 06/16/2017   Procedure: ESOPHAGEAL DILATION;  Surgeon: Rogene Houston, MD;  Location[facility];  Service: Endoscopy;  Laterality: N/A;  . ESOPHAGOGASTRODUODENOSCOPY N/A 11/26/2015   Procedure: ESOPHAGOGASTRODUODENOSCOPY (EGD);  Surgeon: Rogene Houston, MD;  Location: [facility];  Service: Endoscopy;  Laterality: N/A;  1:25  . ESOPHAGOGASTRODUODENOSCOPY N/A 05/23/2017   Procedure: ESOPHAGOGASTRODUODENOSCOPY (EGD);  Surgeon: Rogene Houston, MD;  [facility];  Service: Endoscopy;  Laterality: N/A;  1:15  . ESOPHAGOGASTRODUODENOSCOPY N/A 06/16/2017   Procedure: ESOPHAGOGASTRODUODENOSCOPY (EGD);  Surgeon: Rogene Houston, MD;  Location: [facility] END[facility] SU[facility];  Service: Endoscopy;  Laterality: N/A;  1240  . HERNIA REPAIR Left   . KNEE CARTILAGE SURGERY Bilateral   . LEFT HEART CATH AND CORS/GRAFTS ANGIOGRAPHY N/A 09/08/2016   Procedure: LEFT HEART CATH AND CORS/GRAFTS ANGIOGRAPHY;  Surgeon: Martinique, Peter M, MD;  Location: [place] CV LAB;  Service: Cardiovascular;  Laterality: N/A;  . LEFT HEART CATHETERIZATION WITH CORONARY ANGIOGRAM N/A 09/08/2011   Procedure: LEFT HEART CATHETERIZATION WITH CORONARY ANGIOGRAM;  Surgeon: Sherren Mocha, MD;  Location: [facility] CATH LAB;  Service: Cardiovascular;  Laterality: N/A;  . PERCUTANEOUS CORONARY STENT INTERVENTION (PCI-S) Right 09/08/2011   Procedure: PERCUTANEOUS CORONARY STENT INTERVENTION (PCI-S);  Surgeon: Sherren Mocha, MD;  Location: [facility] CATH LAB;  Service: Cardiovascular;  Laterality: Right;  . ROBOT ASSISTED LAPAROSCOPIC RADICAL PROSTATECTOMY         Family History  Problem Relation Age of Onset  . Cancer Father 22       died  . Stroke Mother 80       died    Social History   Tobacco Use  . Smoking status: Former Smoker    Years: 12.00    Types: Cigars  . Smokeless tobacco: Never Used  . Tobacco  comment: "quit smoking in ~ 1978"  Substance Use Topics  . Alcohol use: Yes    Comment: occasionally   . Drug use: No    Home Medications Prior to Admission medications   Medication Sig Start Date End Date Taking? Authorizing Provider  amLODipine (NORVASC) 10 MG tablet Take 1 tablet by mouth daily. 04/20/20   [provider]  aspirin 81 MG tablet Take 1 tablet (81 mg total) by mouth at bedtime. 06/17/17   Rehman, Mechele Dawley, MD  atorvastatin (LIPITOR) 40 MG tablet Take 1 tablet (40 mg total) by mouth daily. Please call and schedule annual appointment for continued refills. 03/03/20 06/01/20  Sherren Mocha, MD  B Complex Vitamins (VITAMIN B COMPLEX) TABS Take 1 tablet by mouth daily.    [provider]  Biotin 5000 MCG CAPS Take 5,000 mcg by mouth daily.  Patient not taking: Reported on 05/28/2020    [provider]  celecoxib (CELEBREX) 200 MG capsule Take 1 capsule (200 mg total) by mouth daily. 01/26/12   Hillary Bow, MD  cetirizine (ZYRTEC) 10 MG tablet  Take 10 mg by mouth daily.    [provider]  Cholecalciferol 125 MCG (5000 UT) TABS Take 1 tablet by mouth daily.    [provider]  clopidogrel (PLAVIX) 75 MG tablet Take 1 tablet (75 mg total) by mouth in the morning. Please make overdue appt with Dr. Burt Knack before anymore refills. Thank you 1st attempt 02/18/20   Sherren Mocha, MD  cyanocobalamin (,VITAMIN B-12,) 1000 MCG/ML injection Inject 1,000 mcg into the muscle See admin instructions. Monthly as needed 03/25/15   [provider]  gabapentin (NEURONTIN) 600 MG tablet Take 600 mg by mouth 3 (three) times daily. 02/27/15   [provider]  glucosamine-chondroitin 500-400 MG tablet Take 1 tablet by mouth daily.    [provider]  isosorbide mononitrate (IMDUR) 60 MG 24 hr tablet TAKE 1 TABLET BY MOUTH DAILY 06/02/20   Sherren Mocha, MD  levothyroxine (SYNTHROID, LEVOTHROID) 150 MCG tablet Take 150 mcg by mouth  daily before breakfast.    [provider]  meclizine (ANTIVERT) 25 MG tablet Take 1 tablet (25 mg total) by mouth 2 (two) times daily as needed for dizziness. 05/29/20   Little Ishikawa, MD  Multiple Vitamin (MULTIVITAMIN) tablet Take 1 tablet by mouth every other day.    [provider]  nitroGLYCERIN (NITROSTAT) 0.4 MG SL tablet Place 1 tablet (0.4 mg total) under the tongue every 5 (five) minutes as needed for chest pain. Please make appt with Dr. Burt Knack.1st attempt 09/29/17   Sherren Mocha, MD  Omega-3 Fatty Acids (FISH OIL) 1200 MG CAPS Take 1,200 mg by mouth daily.  06/16/17   Rogene Houston, MD  Polyvinyl Alcohol-Povidone (REFRESH OP) Place 1 drop into both eyes daily as needed (dry eyes).    [provider]  Potassium Gluconate 2.5 MEQ TABS Take 2 tablets by mouth at bedtime.    [provider]  traMADol (ULTRAM) 50 MG tablet Take 50 mg by mouth 3 (three) times daily. 12/24/13   [provider]    Allergies    Codeine and Pollen extract  Review of Systems   Review of Systems  Constitutional: Negative for fever.  Gastrointestinal: Negative for vomiting.  Genitourinary: Positive for difficulty urinating and hematuria.  All other systems reviewed and are negative.   Physical Exam Updated Vital Signs BP (!) 153/61   Pulse 64   Temp 98.3 F (36.8 C) (Oral)   Resp 18   Ht 1.842 m (6' 0.5")   Wt 98 kg   SpO2 98%   BMI 28.90 kg/m   Physical Exam CONSTITUTIONAL: Elderly, uncomfortable appearing HEAD: Normocephalic/atraumatic EYES: EOMI ENMT: Mucous membranes moist NECK: supple no meningeal signs CV: S1/S2 noted LUNGS: Lungs are clear to auscultation bilaterally, no apparent distress ABDOMEN: soft, mild suprapubic tenderness GU: Gross blood at the urethral meatus NEURO: Pt is awake/alert/appropriate, moves all extremitiesx4.  No facial droop.   EXTREMITIES:  full ROM SKIN: warm, color normal PSYCH: Anxious  ED Results /  Procedures / Treatments   Labs (all labs ordered are listed, but only abnormal results are displayed) Labs Reviewed  URINALYSIS, ROUTINE W REFLEX MICROSCOPIC - Abnormal; Notable for the following components:      Result Value   Color, Urine RED (*)    APPearance TURBID (*)    Glucose, UA   (*)    Value: TEST NOT REPORTED DUE TO COLOR INTERFERENCE OF URINE PIGMENT   Hgb urine dipstick   (*)    Value: TEST NOT REPORTED  DUE TO COLOR INTERFERENCE OF URINE PIGMENT   Bilirubin Urine   (*)    Value: TEST NOT REPORTED DUE TO COLOR INTERFERENCE OF URINE PIGMENT   Ketones, ur   (*)    Value: TEST NOT REPORTED DUE TO COLOR INTERFERENCE OF URINE PIGMENT   Protein, ur   (*)    Value: TEST NOT REPORTED DUE TO COLOR INTERFERENCE OF URINE PIGMENT   Nitrite   (*)    Value: TEST NOT REPORTED DUE TO COLOR INTERFERENCE OF URINE PIGMENT   Leukocytes,Ua   (*)    Value: TEST NOT REPORTED DUE TO COLOR INTERFERENCE OF URINE PIGMENT   All other components within normal limits  URINALYSIS, MICROSCOPIC (REFLEX) - Abnormal; Notable for the following components:   Bacteria, UA FEW (*)    All other components within normal limits  URINE CULTURE    EKG None  Radiology No results found.  Procedures Procedures   Medications Ordered in ED Medications - No data to display  ED Course  I have reviewed the triage vital signs and the nursing notes.  Pertinent labs  results that were available during my care of the patient were reviewed by me and considered in my medical decision making (see chart for details).    MDM Rules/Calculators/A&P                          Patient presents with hematuria and urinary obstruction after recent bladder resection.  He was able to pass some urine spontaneously but will still having pain and felt like he had a full bladder.  Catheter was placed and patient had immediate relief with significant urine output.  He is now improved.  He is in no acute distress.  He will need  follow-up with his urologist Final Clinical Impression(s) / ED Diagnoses Final diagnoses:  Urinary retention  Gross hematuria    Rx / DC Orders ED Discharge Orders    None       Ripley Fraise, MD 06/17/20 [phone]

## 2020-06-17 NOTE — ED Provider Notes (Signed)
Emergency Medicine Provider Triage Evaluation Note  [name] , a 75 y.o. male  was evaluated in triage.  Pt complains of foley not draining   Review of Systems  Positive: blood in urine  Negative: No fever  Physical Exam  BP (!) 160/76 (BP Location: Right Arm)   Pulse 73   Temp 98.6 F (37 C) (Oral)   Resp 16   SpO2 97%  Gen:   Awake, no distress   Resp:  Normal effort  MSK:   Moves extremities without difficulty  Other:    Medical Decision Making  Medically screening exam initiated at 5:30 PM.  Appropriate orders placed.  [name] was informed that the remainder of the evaluation will be completed by another provider, this initial triage assessment does not replace that evaluation, and the importance of remaining in the ED until their evaluation is complete.  Foleynirrigation ordered    [name] 06/17/20 1731    Carmin Muskrat, MD 06/17/20 2333

## 2020-06-17 NOTE — Discharge Instructions (Signed)
Call your urologist today for follow-up later this week

## 2020-06-17 NOTE — ED Provider Notes (Signed)
[place] Provider Note   CSN: [id number] Arrival date & time: 06/17/20  1616     History Chief Complaint  Patient presents with  . Urinary Retention    [name] is a 75 y.o. male.  [facility]  Surgery by Dr. Rosana Hoes.  Pt seen here earlier today due to bleeding and had a foley placed.  Pt reports foley is not draining.  Pt reports he is on plavix.    The history is provided by the patient. No language interpreter was used.  Urinary Frequency       Past Medical History:  Diagnosis Date  . Anginal pain ([place])   . Arthritis    "all over"  . Bladder tumor   . Chronic lower back pain   . Coronary artery disease    a. s/p CABG x 2 (LIMA->LAD, RIMA->RCA);  b. s/p multiple PCI's to Ramus;  c. 08/2011 Cath/PCI: LM 70% into ramus with 70-80% there->treated wtih 3.5x18 Xience Xpedition DES, LCX  nonobs, RCA occluded.  RIMA & LIMA patent, EF 55-65%  . DJD (degenerative joint disease)   . GERD (gastroesophageal reflux disease)   . History of gout   . Hyperlipidemia   . Hypertension   . Hypothyroidism   . Neuropathy   . OSA on CPAP   . Prostate cancer [facility])     Patient Active Problem List   Diagnosis Date Noted  . Dizziness 05/28/2020  . Cervicalgia 05/28/2020  . GERD (gastroesophageal reflux disease) 05/28/2020  . Obstructive sleep apnea on CPAP 05/28/2020  . Esophageal dysphagia 05/04/2017  . Chest pain 09/08/2016  . Dysphagia 09/22/2015  . Exertional angina ([place]) 04/10/2015  . Abnormal nuclear cardiac imaging test 04/08/2015  . Aortic stenosis 09/24/2011  . Unstable angina ([place]) 09/09/2011  . Murmur 04/19/2011  . Coronary artery disease 12/08/2010  . Hypothyroidism 09/28/2008  . Hyperlipidemia 09/28/2008  . Essential hypertension 09/28/2008  . CAD, ARTERY BYPASS GRAFT 09/28/2008  . DEGENERATIVE JOINT DISEASE 09/28/2008  . BACK PAIN, CHRONIC 09/28/2008    Past Surgical History:  Procedure Laterality Date   . ABDOMINAL HERNIA REPAIR    . BIOPSY  11/26/2015   Procedure: BIOPSY;  Surgeon: Rogene Houston, MD;  Location[facility];  Service: Endoscopy;;  gastric esophagus  . BLADDER TUMOR EXCISION    . BROW LIFT Bilateral 04/27/2019   Procedure: BILATERAL BLEPHAROPLASTY;  Surgeon: Baruch Goldmann, MD;  Location: [facility] ORS;  Service: Ophthalmology;  Laterality: Bilateral;  . CARDIAC CATHETERIZATION N/A 04/10/2015   Procedure: Left Heart Cath and Cors/Grafts Angiography;  Surgeon: Sherren Mocha, MD;  Location: [place] CV LAB;  Service: Cardiovascular;  Laterality: N/A;  . CARDIAC CATHETERIZATION N/A 04/10/2015   Procedure: Coronary Stent Intervention;  Surgeon: Sherren Mocha, MD;  Location: [place] CV LAB;  Service: Cardiovascular;  Laterality: N/A;  . CATARACT EXTRACTION W/PHACO Right 06/23/2015   Procedure: CATARACT EXTRACTION PHACO AND INTRAOCULAR LENS PLACEMENT RIGHT EYE; CDE:  7.08;  Surgeon: Tonny Branch, MD;  Location: [facility] ORS;  Service: Ophthalmology;  Laterality: Right;  . CATARACT EXTRACTION W/PHACO Left 08/25/2015   Procedure: CATARACT EXTRACTION PHACO AND INTRAOCULAR LENS PLACEMENT LEFT EYE; CDE:  8.18;  Surgeon: Tonny Branch, MD;  Location: [facility] ORS;  Service: Ophthalmology;  Laterality: Left;  . COLONOSCOPY N/A 12/28/2012   Procedure: COLONOSCOPY;  Surgeon: Rogene Houston, MD;  Location: [facility];  Service: Endoscopy;  Laterality: N/A;  830 rescheduled  . CORONARY ANGIOPLASTY  WITH STENT PLACEMENT  2013; 04/10/2015   "this makes me a total of 7" (04/10/2015)  . CORONARY ARTERY BYPASS GRAFT  1997   with (LIMA)  . ESOPHAGEAL DILATION N/A 11/26/2015   Procedure: ESOPHAGEAL DILATION;  Surgeon: Rogene Houston, MD;  [facility];  Service: Endoscopy;  Laterality: N/A;  . ESOPHAGEAL DILATION N/A 05/23/2017   Procedure: ESOPHAGEAL DILATION;  Surgeon: Rogene Houston, MD;  [facility];  Service: Endoscopy;  Laterality: N/A;  . ESOPHAGEAL DILATION N/A 06/16/2017    Procedure: ESOPHAGEAL DILATION;  Surgeon: Rogene Houston, MD;  [facility];  Service: Endoscopy;  Laterality: N/A;  . ESOPHAGOGASTRODUODENOSCOPY N/A 11/26/2015   Procedure: ESOPHAGOGASTRODUODENOSCOPY (EGD);  Surgeon: Rogene Houston, MD;  Location: [facility];  Service: Endoscopy;  Laterality: N/A;  1:25  . ESOPHAGOGASTRODUODENOSCOPY N/A 05/23/2017   Procedure: ESOPHAGOGASTRODUODENOSCOPY (EGD);  Surgeon: Rogene Houston, MD;  Location[facility];  Service: Endoscopy;  Laterality: N/A;  1:15  . ESOPHAGOGASTRODUODENOSCOPY N/A 06/16/2017   Procedure: ESOPHAGOGASTRODUODENOSCOPY (EGD);  Surgeon: Rogene Houston, MD;  [facility];  Service: Endoscopy;  Laterality: N/A;  1240  . HERNIA REPAIR Left   . KNEE CARTILAGE SURGERY Bilateral   . LEFT HEART CATH AND CORS/GRAFTS ANGIOGRAPHY N/A 09/08/2016   Procedure: LEFT HEART CATH AND CORS/GRAFTS ANGIOGRAPHY;  Surgeon: Martinique, Peter M, MD;  Location: [place] CV LAB;  Service: Cardiovascular;  Laterality: N/A;  . LEFT HEART CATHETERIZATION WITH CORONARY ANGIOGRAM N/A 09/08/2011   Procedure: LEFT HEART CATHETERIZATION WITH CORONARY ANGIOGRAM;  Surgeon: Sherren Mocha, MD;  Location: [facility] CATH LAB;  Service: Cardiovascular;  Laterality: N/A;  . PERCUTANEOUS CORONARY STENT INTERVENTION (PCI-S) Right 09/08/2011   Procedure: PERCUTANEOUS CORONARY STENT INTERVENTION (PCI-S);  Surgeon: Sherren Mocha, MD;  Location: [facility] CATH LAB;  Service: Cardiovascular;  Laterality: Right;  . ROBOT ASSISTED LAPAROSCOPIC RADICAL PROSTATECTOMY         Family History  Problem Relation Age of Onset  . Cancer Father 72       died  . Stroke Mother 6       died    Social History   Tobacco Use  . Smoking status: Former Smoker    Years: 12.00    Types: Cigars  . Smokeless tobacco: Never Used  . Tobacco comment: "quit smoking in ~ 1978"  Substance Use Topics  . Alcohol use: Yes    Comment: occasionally   . Drug use: No    Home  Medications Prior to Admission medications   Medication Sig Start Date End Date Taking? Authorizing Provider  amLODipine (NORVASC) 10 MG tablet Take 1 tablet by mouth daily. 04/20/20   [provider]  aspirin 81 MG tablet Take 1 tablet (81 mg total) by mouth at bedtime. 06/17/17   Steve, Mechele Dawley, MD  atorvastatin (LIPITOR) 40 MG tablet Take 1 tablet (40 mg total) by mouth daily. Please call and schedule annual appointment for continued refills. 03/03/20 06/01/20  Sherren Mocha, MD  B Complex Vitamins (VITAMIN B COMPLEX) TABS Take 1 tablet by mouth daily.    [provider]  Biotin 5000 MCG CAPS Take 5,000 mcg by mouth daily.  Patient not taking: Reported on 05/28/2020    [provider]  celecoxib (CELEBREX) 200 MG capsule Take 1 capsule (200 mg total) by mouth daily. 01/26/12   Hillary Bow, MD  cetirizine (ZYRTEC) 10 MG tablet Take 10 mg by mouth daily.    [provider]  Cholecalciferol 125 MCG (5000 UT) TABS Take 1 tablet by mouth daily.    [provider]  clopidogrel (PLAVIX) 75 MG tablet Take 1 tablet (75 mg total) by mouth in the morning. Please make overdue appt with Dr. Burt Knack before anymore refills. Thank you 1st attempt 02/18/20   Sherren Mocha, MD  cyanocobalamin (,VITAMIN B-12,) 1000 MCG/ML injection Inject 1,000 mcg into the muscle See admin instructions. Monthly as needed 03/25/15   [provider]  gabapentin (NEURONTIN) 600 MG tablet Take 600 mg by mouth 3 (three) times daily. 02/27/15   [provider]  glucosamine-chondroitin 500-400 MG tablet Take 1 tablet by mouth daily.    [provider]  isosorbide mononitrate (IMDUR) 60 MG 24 hr tablet TAKE 1 TABLET BY MOUTH DAILY 06/02/20   Sherren Mocha, MD  levothyroxine (SYNTHROID, LEVOTHROID) 150 MCG tablet Take 150 mcg by mouth daily before breakfast.    [provider]  meclizine (ANTIVERT) 25 MG tablet Take 1 tablet (25 mg total) by mouth 2 (two)  times daily as needed for dizziness. 05/29/20   Little Ishikawa, MD  Multiple Vitamin (MULTIVITAMIN) tablet Take 1 tablet by mouth every other day.    [provider]  nitroGLYCERIN (NITROSTAT) 0.4 MG SL tablet Place 1 tablet (0.4 mg total) under the tongue every 5 (five) minutes as needed for chest pain. Please make appt with Dr. Burt Knack.1st attempt 09/29/17   Sherren Mocha, MD  Omega-3 Fatty Acids (FISH OIL) 1200 MG CAPS Take 1,200 mg by mouth daily.  06/16/17   Rogene Houston, MD  Polyvinyl Alcohol-Povidone (REFRESH OP) Place 1 drop into both eyes daily as needed (dry eyes).    [provider]  Potassium Gluconate 2.5 MEQ TABS Take 2 tablets by mouth at bedtime.    [provider]  traMADol (ULTRAM) 50 MG tablet Take 50 mg by mouth 3 (three) times daily. 12/24/13   [provider]    Allergies    Codeine and Pollen extract  Review of Systems   Review of Systems  Genitourinary: Positive for frequency and hematuria.  All other systems reviewed and are negative.   Physical Exam Updated Vital Signs BP (!) 160/76 (BP Location: Right Arm)   Pulse 73   Temp 98.6 F (37 C) (Oral)   Resp 16   SpO2 97%   Physical Exam Vitals reviewed.  Cardiovascular:     Rate and Rhythm: Normal rate.  Pulmonary:     Effort: Pulmonary effort is normal.  Abdominal:     General: Abdomen is flat.  Skin:    General: Skin is warm.  Neurological:     General: No focal deficit present.     Mental Status: He is alert.  Psychiatric:        Mood and Affect: Mood normal.     ED Results / Procedures / Treatments   Labs (all labs ordered are listed, but only abnormal results are displayed) Labs Reviewed - No data to display  EKG None  Radiology No results found.  Procedures Procedures   Medications Ordered in ED Medications - No data to display  ED Course  I have reviewed the triage vital signs and the nursing notes.  Pertinent labs & imaging  results that were available during my care of the patient were reviewed by me and considered in my medical decision making (see chart for details).    MDM Rules/Calculators/A&P  RN irrigated foley,some clots,   Pt complained of pain,  Urine noted around foley,   We will replace foley   Final Clinical Impression(s) / ED Diagnoses Final diagnoses:  Hematuria, unspecified type  Urinary retention    Rx / DC Orders ED Discharge Orders    None    An After Visit Summary was printed and given to the patient.    [name] 06/17/20 1846    Carmin Muskrat, MD 06/17/20 [phone]

## 2020-06-17 NOTE — ED Notes (Signed)
Pt to take leg bag home with him to change in the AM being that he will go to sleep as soon as he gets home and does not want to have to change them over again.

## 2020-06-17 NOTE — ED Triage Notes (Signed)
States he had foley placed this morning here. States it is not draining

## 2020-06-17 NOTE — ED Triage Notes (Signed)
Pt c/o difficulty urinating since having tumor removed from bladder last week.

## 2020-06-17 NOTE — Discharge Instructions (Signed)
Do not take plavix.  Call Dr. Rosana Hoes to be seen for recheck.  Return if any problems.

## 2020-06-17 NOTE — ED Notes (Signed)
Bladder scan 505 ml.  Blood visible draining from penis

## 2020-06-17 NOTE — ED Notes (Addendum)
Pt states relief after urinating 225 cc. Pt is resting with no signs of distress noted.  Per Dr. Christy Gentles hold foley for the time being.

## 2020-06-18 LAB — URINE CULTURE: Culture: NO GROWTH

## 2020-06-25 DIAGNOSIS — C672 Malignant neoplasm of lateral wall of bladder: Secondary | ICD-10-CM | POA: Diagnosis not present

## 2020-06-25 DIAGNOSIS — N3289 Other specified disorders of bladder: Secondary | ICD-10-CM | POA: Diagnosis not present

## 2020-07-01 ENCOUNTER — Other Ambulatory Visit: Payer: Self-pay | Admitting: Cardiovascular Disease

## 2020-07-02 DIAGNOSIS — C672 Malignant neoplasm of lateral wall of bladder: Secondary | ICD-10-CM | POA: Diagnosis not present

## 2020-07-09 DIAGNOSIS — C672 Malignant neoplasm of lateral wall of bladder: Secondary | ICD-10-CM | POA: Diagnosis not present

## 2020-07-09 DIAGNOSIS — R310 Gross hematuria: Secondary | ICD-10-CM | POA: Diagnosis not present

## 2020-07-09 DIAGNOSIS — R82998 Other abnormal findings in urine: Secondary | ICD-10-CM | POA: Diagnosis not present

## 2020-07-11 DIAGNOSIS — K1329 Other disturbances of oral epithelium, including tongue: Secondary | ICD-10-CM | POA: Diagnosis not present

## 2020-07-23 DIAGNOSIS — R310 Gross hematuria: Secondary | ICD-10-CM | POA: Diagnosis not present

## 2020-07-23 DIAGNOSIS — R808 Other proteinuria: Secondary | ICD-10-CM | POA: Diagnosis not present

## 2020-07-23 DIAGNOSIS — C672 Malignant neoplasm of lateral wall of bladder: Secondary | ICD-10-CM | POA: Diagnosis not present

## 2020-07-23 DIAGNOSIS — R8289 Other abnormal findings on cytological and histological examination of urine: Secondary | ICD-10-CM | POA: Diagnosis not present

## 2020-07-25 ENCOUNTER — Telehealth: Payer: Self-pay | Admitting: Cardiovascular Disease

## 2020-07-25 ENCOUNTER — Other Ambulatory Visit: Payer: Self-pay | Admitting: Cardiovascular Disease

## 2020-07-25 MED ORDER — ATORVASTATIN CALCIUM 40 MG PO TABS: 40.0000 mg | ORAL_TABLET | Freq: Every day | ORAL | 1 refills | Status: DC

## 2020-07-25 MED ORDER — ISOSORBIDE MONONITRATE ER 60 MG PO TB24: 60.0000 mg | ORAL_TABLET | Freq: Every day | ORAL | 1 refills | Status: DC

## 2020-07-25 NOTE — Telephone Encounter (Signed)
*  STAT* If patient is at the pharmacy, call can be transferred to refill team.   1. Which medications need to be refilled? (please list name of each medication and dose if known) Atorvastatin and Isosorbide  2. Which pharmacy/location (including street and city if local pharmacy) is medication to be sent to? [place], [place]  3. Do they need a 30 day or 90 day supply?  enough his appointment on  09-24-20

## 2020-07-25 NOTE — Telephone Encounter (Signed)
Pt's medication was sent to pt's pharmacy as requested. Confirmation received.  °

## 2020-07-30 DIAGNOSIS — R82998 Other abnormal findings in urine: Secondary | ICD-10-CM | POA: Diagnosis not present

## 2020-07-30 DIAGNOSIS — C672 Malignant neoplasm of lateral wall of bladder: Secondary | ICD-10-CM | POA: Diagnosis not present

## 2020-07-30 DIAGNOSIS — R310 Gross hematuria: Secondary | ICD-10-CM | POA: Diagnosis not present

## 2020-08-11 DIAGNOSIS — I25810 Atherosclerosis of coronary artery bypass graft(s) without angina pectoris: Secondary | ICD-10-CM | POA: Diagnosis not present

## 2020-08-11 DIAGNOSIS — E039 Hypothyroidism, unspecified: Secondary | ICD-10-CM | POA: Diagnosis not present

## 2020-08-11 DIAGNOSIS — Z0001 Encounter for general adult medical examination with abnormal findings: Secondary | ICD-10-CM | POA: Diagnosis not present

## 2020-08-11 DIAGNOSIS — G8929 Other chronic pain: Secondary | ICD-10-CM | POA: Diagnosis not present

## 2020-08-11 DIAGNOSIS — E782 Mixed hyperlipidemia: Secondary | ICD-10-CM | POA: Diagnosis not present

## 2020-08-11 DIAGNOSIS — G4733 Obstructive sleep apnea (adult) (pediatric): Secondary | ICD-10-CM | POA: Diagnosis not present

## 2020-08-13 DIAGNOSIS — R82998 Other abnormal findings in urine: Secondary | ICD-10-CM | POA: Diagnosis not present

## 2020-08-13 DIAGNOSIS — C672 Malignant neoplasm of lateral wall of bladder: Secondary | ICD-10-CM | POA: Diagnosis not present

## 2020-08-13 DIAGNOSIS — R310 Gross hematuria: Secondary | ICD-10-CM | POA: Diagnosis not present

## 2020-08-13 DIAGNOSIS — R808 Other proteinuria: Secondary | ICD-10-CM | POA: Diagnosis not present

## 2020-08-20 DIAGNOSIS — C672 Malignant neoplasm of lateral wall of bladder: Secondary | ICD-10-CM | POA: Diagnosis not present

## 2020-08-20 DIAGNOSIS — R800 Isolated proteinuria: Secondary | ICD-10-CM | POA: Diagnosis not present

## 2020-08-20 DIAGNOSIS — R310 Gross hematuria: Secondary | ICD-10-CM | POA: Diagnosis not present

## 2020-08-20 DIAGNOSIS — R82998 Other abnormal findings in urine: Secondary | ICD-10-CM | POA: Diagnosis not present

## 2020-08-25 ENCOUNTER — Other Ambulatory Visit: Payer: Self-pay | Admitting: Cardiovascular Disease

## 2020-09-22 DIAGNOSIS — R310 Gross hematuria: Secondary | ICD-10-CM | POA: Diagnosis not present

## 2020-09-22 DIAGNOSIS — R808 Other proteinuria: Secondary | ICD-10-CM | POA: Diagnosis not present

## 2020-09-22 DIAGNOSIS — Z8551 Personal history of malignant neoplasm of bladder: Secondary | ICD-10-CM | POA: Diagnosis not present

## 2020-09-22 DIAGNOSIS — N35010 Post-traumatic urethral stricture, male, meatal: Secondary | ICD-10-CM | POA: Diagnosis not present

## 2020-09-24 ENCOUNTER — Encounter: Payer: Self-pay | Admitting: Physician Assistant

## 2020-09-24 ENCOUNTER — Ambulatory Visit: Payer: Medicare Other | Admitting: Physician Assistant

## 2020-09-24 ENCOUNTER — Other Ambulatory Visit: Payer: Self-pay

## 2020-09-24 VITALS — BP 160/70 | HR 66 | Ht 73.5 in | Wt 215.4 lb

## 2020-09-24 DIAGNOSIS — I200 Unstable angina: Secondary | ICD-10-CM

## 2020-09-24 DIAGNOSIS — R011 Cardiac murmur, unspecified: Secondary | ICD-10-CM

## 2020-09-24 DIAGNOSIS — I25700 Atherosclerosis of coronary artery bypass graft(s), unspecified, with unstable angina pectoris: Secondary | ICD-10-CM

## 2020-09-24 DIAGNOSIS — I10 Essential (primary) hypertension: Secondary | ICD-10-CM | POA: Diagnosis not present

## 2020-09-24 MED ORDER — CLOPIDOGREL BISULFATE 75 MG PO TABS: 75.0000 mg | ORAL_TABLET | Freq: Every morning | ORAL | 3 refills | Status: DC

## 2020-09-24 MED ORDER — CARVEDILOL 3.125 MG PO TABS: 3.1250 mg | ORAL_TABLET | Freq: Two times a day (BID) | ORAL | 3 refills | Status: DC

## 2020-09-24 MED ORDER — ATORVASTATIN CALCIUM 40 MG PO TABS: ORAL_TABLET | ORAL | 3 refills | Status: DC

## 2020-09-24 MED ORDER — NITROGLYCERIN 0.4 MG SL SUBL: 0.4000 mg | SUBLINGUAL_TABLET | SUBLINGUAL | 3 refills | Status: DC | PRN

## 2020-09-24 NOTE — Patient Instructions (Addendum)
Medication Instructions:  Your physician has recommended you make the following change in your medication:   START Coreg 3.125 mg taking 1 tablet twice a day   *If you need a refill on your cardiac medications before your next appointment, please call your pharmacy*   Lab Work: None ordered  If you have labs (blood work) drawn today and your tests are completely normal, you will receive your results only by: [place] (if you have MyChart) OR A paper copy in the mail If you have any lab test that is abnormal or we need to change your treatment, we will call you to review the results.   Testing/Procedures: None ordered   Follow-Up: At [facility], you and your health needs are our priority.  As part of our continuing mission to provide you with exceptional heart care, we have created designated Provider Care Teams.  These Care Teams include your primary Cardiologist (physician) and Advanced Practice Providers (APPs -  Physician Assistants and Nurse Practitioners) who all work together to provide you with the care you need, when you need it.  We recommend signing up for the patient portal called "MyChart".  Sign up information is provided on this After Visit Summary.  MyChart is used to connect with patients for Virtual Visits (Telemedicine).  Patients are able to view lab/test results, encounter notes, upcoming appointments, etc.  Non-urgent messages can be sent to your provider as well.   To learn more about what you can do with MyChart, go to [email or web address].    Your next appointment:   12 month(s)  The format for your next appointment:   In Person  Provider:   You may see Sherren Mocha, MD or one of the following Advanced Practice Providers on your designated Care Team:   Richardson Dopp, PA-C Robbie Lis, [place]   Other Instructions

## 2020-09-24 NOTE — Progress Notes (Signed)
Cardiology Office Note:    Date:  09/24/2020   ID:  [name], [name] [birth year], MRN [id number]  PCP:  Sharilyn Sites, MD  [facility] Cardiologist:  Sherren Mocha, MD  [facility] Electrophysiologist:  None   Chief Complaint: 2 year follow up for CAD  History of Present Illness:    [name] is a 75 y.o. male with a hx of CAD s/p CABG in 1997 and then subsequent PCIs, HTN and HLD seen for follow up.   Hx of CAD s/p CABG in 1997 (LIMA to LAD and RIMA to RCA).  Underwent PCI in March 2017 (Treated with overlapping DES in the distal RCA through the graft to the RCA) after presenting with exertional angina and an abnormal nuclear scan. Last cath in 08/2016 showed patent 2/2 grafts and patent stents in distal RCA.Marland Kitchen   Last seen by Dr. Burt Knack 11/2018.  Here today for follow up. Pt walks 1 to 2 miles few times per day without significant issue.  He denies chest pain, shortness of breath, orthopnea, PND, syncope, lower extremity edema or melena.  History of urinary retention s/p transcatheter resection of the bladder.  He has some problem starting urination.  Patient reports systolic blood pressure in 140s, at times it goes to 160-180s.  Past Medical History:  Diagnosis Date   Anginal pain ([place])    Arthritis    "all over"   Bladder tumor    Chronic lower back pain    Coronary artery disease    a. s/p CABG x 2 (LIMA->LAD, RIMA->RCA);  b. s/p multiple PCI's to Ramus;  c. 08/2011 Cath/PCI: LM 70% into ramus with 70-80% there->treated wtih 3.5x18 Xience Xpedition DES, LCX  nonobs, RCA occluded.  RIMA & LIMA patent, EF 55-65%   DJD (degenerative joint disease)    GERD (gastroesophageal reflux disease)    History of gout    Hyperlipidemia    Hypertension    Hypothyroidism    Neuropathy    OSA on CPAP    Prostate cancer [facility])     Past Surgical History:  Procedure Laterality Date   ABDOMINAL HERNIA REPAIR     BIOPSY  11/26/2015   Procedure: BIOPSY;  Surgeon: Rogene Houston, MD;  Location: [facility] END[facility];  Service: Endoscopy;;  gastric esophagus   BLADDER TUMOR EXCISION     BROW LIFT Bilateral 04/27/2019   Procedure: BILATERAL BLEPHAROPLASTY;  Surgeon: Baruch Goldmann, MD;  Location: [facility] ORS;  Service: Ophthalmology;  Laterality: Bilateral;   CARDIAC CATHETERIZATION N/A 04/10/2015   Procedure: Left Heart Cath and Cors/Grafts Angiography;  Surgeon: Sherren Mocha, MD;  Location: [place] CV LAB;  Service: Cardiovascular;  Laterality: N/A;   CARDIAC CATHETERIZATION N/A 04/10/2015   Procedure: Coronary Stent Intervention;  Surgeon: Sherren Mocha, MD;  Location: [place] CV LAB;  Service: Cardiovascular;  Laterality: N/A;   CATARACT EXTRACTION W/PHACO Right 06/23/2015   Procedure: CATARACT EXTRACTION PHACO AND INTRAOCULAR LENS PLACEMENT RIGHT EYE; CDE:  7.08;  Surgeon: Tonny Branch, MD;  Location: [facility] ORS;  Service: Ophthalmology;  Laterality: Right;   CATARACT EXTRACTION W/PHACO Left 08/25/2015   Procedure: CATARACT EXTRACTION PHACO AND INTRAOCULAR LENS PLACEMENT LEFT EYE; CDE:  8.18;  Surgeon: Tonny Branch, MD;  Location: [facility] ORS;  Service: Ophthalmology;  Laterality: Left;   COLONOSCOPY N/A 12/28/2012   Procedure: COLONOSCOPY;  Surgeon: Rogene Houston, MD;  Location: [facility];  Service: Endoscopy;  Laterality: N/A;  830 rescheduled   CORONARY ANGIOPLASTY WITH STENT PLACEMENT  2013; 04/10/2015   "this makes me a total of 7" (04/10/2015)   [place]   with (LIMA)   ESOPHAGEAL DILATION N/A 11/26/2015   Procedure: ESOPHAGEAL DILATION;  Surgeon: Rogene Houston, MD;  [facility];  Service: Endoscopy;  Laterality: N/A;   ESOPHAGEAL DILATION N/A 05/23/2017   Procedure: ESOPHAGEAL DILATION;  Surgeon: Rogene Houston, MD;  [facility];  Service: Endoscopy;  Laterality: N/A;   ESOPHAGEAL DILATION N/A 06/16/2017   Procedure: ESOPHAGEAL DILATION;  Surgeon: Rogene Houston, MD;  [facility];  Service: Endoscopy;   Laterality: N/A;   ESOPHAGOGASTRODUODENOSCOPY N/A 11/26/2015   Procedure: ESOPHAGOGASTRODUODENOSCOPY (EGD);  Surgeon: Rogene Houston, MD;  [facility];  Service: Endoscopy;  Laterality: N/A;  1:25   ESOPHAGOGASTRODUODENOSCOPY N/A 05/23/2017   Procedure: ESOPHAGOGASTRODUODENOSCOPY (EGD);  Surgeon: Rogene Houston, MD;  [facility];  Service: Endoscopy;  Laterality: N/A;  1:15   ESOPHAGOGASTRODUODENOSCOPY N/A 06/16/2017   Procedure: ESOPHAGOGASTRODUODENOSCOPY (EGD);  Surgeon: Rogene Houston, MD;  [facility];  Service: Endoscopy;  Laterality: N/A;  1240   HERNIA REPAIR Left    KNEE CARTILAGE SURGERY Bilateral    LEFT HEART CATH AND CORS/GRAFTS ANGIOGRAPHY N/A 09/08/2016   Procedure: LEFT HEART CATH AND CORS/GRAFTS ANGIOGRAPHY;  Surgeon: Martinique, Peter M, MD;  Location: [place] CV LAB;  Service: Cardiovascular;  Laterality: N/A;   LEFT HEART CATHETERIZATION WITH CORONARY ANGIOGRAM N/A 09/08/2011   Procedure: LEFT HEART CATHETERIZATION WITH CORONARY ANGIOGRAM;  Surgeon: Sherren Mocha, MD;  Location: [facility] CATH LAB;  Service: Cardiovascular;  Laterality: N/A;   PERCUTANEOUS CORONARY STENT INTERVENTION (PCI-S) Right 09/08/2011   Procedure: PERCUTANEOUS CORONARY STENT INTERVENTION (PCI-S);  Surgeon: Sherren Mocha, MD;  Location: [facility] CATH LAB;  Service: Cardiovascular;  Laterality: Right;   ROBOT ASSISTED LAPAROSCOPIC RADICAL PROSTATECTOMY      Current Medications: Current Meds  Medication Sig   amLODipine (NORVASC) 10 MG tablet Take 1 tablet by mouth daily.   aspirin 81 MG tablet Take 1 tablet (81 mg total) by mouth at bedtime.   B Complex Vitamins (VITAMIN B COMPLEX) TABS Take 1 tablet by mouth daily.   Biotin 5000 MCG CAPS Take 5,000 mcg by mouth daily.   carvedilol (COREG) 3.125 MG tablet Take 1 tablet (3.125 mg total) by mouth 2 (two) times daily.   celecoxib (CELEBREX) 200 MG capsule Take 1 capsule (200 mg total) by mouth daily.   cetirizine (ZYRTEC) 10 MG  tablet Take 10 mg by mouth daily.   cyanocobalamin (,VITAMIN B-12,) 1000 MCG/ML injection Inject 1,000 mcg into the muscle See admin instructions. Monthly as needed   gabapentin (NEURONTIN) 600 MG tablet Take 600 mg by mouth 3 (three) times daily.   glucosamine-chondroitin 500-400 MG tablet Take 1 tablet by mouth daily.   isosorbide mononitrate (IMDUR) 60 MG 24 hr tablet TAKE 1 TABLET BY MOUTH EVERY DAY. PLEASE KEEP UPCOMING APPOINTMENT IN SEPTEMBER BEFORE ANYMORE REFILLS   levothyroxine (SYNTHROID, LEVOTHROID) 150 MCG tablet Take 150 mcg by mouth daily before breakfast.   meclizine (ANTIVERT) 25 MG tablet Take 1 tablet (25 mg total) by mouth 2 (two) times daily as needed for dizziness.   Multiple Vitamin (MULTIVITAMIN) tablet Take 1 tablet by mouth every other day.   Omega-3 Fatty Acids (FISH OIL) 1200 MG CAPS Take 1,200 mg by mouth daily.    Polyvinyl Alcohol-Povidone (REFRESH OP) Place 1 drop into both eyes daily as needed (dry eyes).  Potassium Gluconate 2.5 MEQ TABS Take 2 tablets by mouth at bedtime.   traMADol (ULTRAM) 50 MG tablet Take 50 mg by mouth 3 (three) times daily.   [DISCONTINUED] atorvastatin (LIPITOR) 40 MG tablet TAKE 1 TABLET BY MOUTH EVERY DAY. PLEASE KEEP UPCOMING APPOINTMENT IN SEPTEMBER BEFORE ANYMORE REFILLS.   [DISCONTINUED] clopidogrel (PLAVIX) 75 MG tablet Take 1 tablet (75 mg total) by mouth in the morning. Please make overdue appt with Dr. Burt Knack before anymore refills. Thank you 1st attempt   [DISCONTINUED] nitroGLYCERIN (NITROSTAT) 0.4 MG SL tablet Place 1 tablet (0.4 mg total) under the tongue every 5 (five) minutes as needed for chest pain. Please make appt with Dr. Burt Knack.1st attempt     Allergies:   Codeine and Pollen extract   Social History   Socioeconomic History   Marital status: Widowed    Spouse name: Not on file   Number of children: Not on file   Years of education: Not on file   Highest education level: Not on file  Occupational History    Occupation: semi-retired  Tobacco Use   Smoking status: Former    Types: Cigars   Smokeless tobacco: Never   Tobacco comments:    "quit smoking in ~ 1978"  Substance and Sexual Activity   Alcohol use: Yes    Comment: occasionally    Drug use: No   Sexual activity: Not Currently  Other Topics Concern   Not on file  Social History Narrative   Not on file   Social Determinants of Health   Financial Resource Strain: Not on file  Food Insecurity: Not on file  Transportation Needs: Not on file  Physical Activity: Not on file  Stress: Not on file  Social Connections: Not on file     Family History: The patient's family history includes Cancer (age of onset: 4) in his father; Stroke (age of onset: 66) in his mother.    ROS:   Please see the history of present illness.    All other systems reviewed and are negative.   EKGs/Labs/Other Studies Reviewed:    The following studies were reviewed today:  LEFT HEART CATH AND CORS/GRAFTS ANGIOGRAPHY   Conclusion    Mid RCA lesion, 100 %stenosed. RIMA. Dist RCA lesion, 0 %stenosed. A drug eluting . Ost RPDA lesion, 0 %stenosed. A drug eluting . Prox LAD lesion, 100 %stenosed. LIMA. Ost LM to LM lesion, 25 %stenosed. Ost Ramus to Ramus lesion, 25 %stenosed. The left ventricular systolic function is normal. LV end diastolic pressure is mildly elevated. The left ventricular ejection fraction is 55-65% by visual estimate.   1. 2 vessel occlusive CAD 2. Patent stents in the left main and large ramus intermediate branch 3. Patent stents in the distal RCA 4. Patent free RIMA to the distal RCA 5. Patent LIMA to the LAD 6. Normal LV function 7. Mildly elevated LVEDP.  Diagnostic Dominance: Right   EKG:  EKG is not  ordered today.    Recent Labs: 05/29/2020: ALT 19; BUN 19; Creatinine, Ser 1.06; Hemoglobin 11.5; Magnesium 2.2; Platelets 180; Potassium 3.8; Sodium 139  Recent Lipid Panel No results found for: CHOL, TRIG,  HDL, CHOLHDL, VLDL, LDLCALC, LDLDIRECT  Physical Exam:    VS:  BP (!) 160/70   Pulse 66   Ht 6' 1.5" (1.867 m)   Wt 215 lb 6.4 oz (97.7 kg)   SpO2 94%   BMI 28.03 kg/m     Wt Readings from Last 3 Encounters:  09/24/20 215  lb 6.4 oz (97.7 kg)  06/17/20 216 lb 0.8 oz (98 kg)  05/28/20 216 lb (98 kg)     GEN: Well nourished, well developed in no acute distress HEENT: Normal NECK: No JVD; No carotid bruits LYMPHATICS: No lymphadenopathy CARDIAC: RRR, no murmurs, rubs, gallops RESPIRATORY:  Clear to auscultation without rales, wheezing or rhonchi  ABDOMEN: Soft, non-tender, non-distended MUSCULOSKELETAL:  No edema; No deformity  SKIN: Warm and dry NEUROLOGIC:  Alert and oriented x 3 PSYCHIATRIC:  Normal affect   ASSESSMENT AND PLAN:    CAD No angina.  Continue exercise regimen.  He has remained on dual antiplatelet therapy with aspirin and Plavix.  Continue Imdur and Lipitor.  2.  Hypertension -Blood pressure elevated here and at home.  Continue amlodipine 10 mg daily.  Add carvedilol 3.25 mg twice daily.  He will keep log and send [place] for review in 2 weeks.  3.  Hyperlipidemia -Lipids managed by PCP.  Continue Lipitor.  Medication Adjustments/Labs and Tests Ordered: Current medicines are reviewed at length with the patient today.  Concerns regarding medicines are outlined above.  No orders of the defined types were placed in this encounter.  Meds ordered this encounter  Medications   atorvastatin (LIPITOR) 40 MG tablet    Sig: TAKE 1 TABLET BY MOUTH EVERY DAY.    Dispense:  90 tablet    Refill:  3   clopidogrel (PLAVIX) 75 MG tablet    Sig: Take 1 tablet (75 mg total) by mouth in the morning.    Dispense:  90 tablet    Refill:  3   nitroGLYCERIN (NITROSTAT) 0.4 MG SL tablet    Sig: Place 1 tablet (0.4 mg total) under the tongue every 5 (five) minutes as needed for chest pain.    Dispense:  25 tablet    Refill:  3   carvedilol (COREG) 3.125 MG tablet    Sig:  Take 1 tablet (3.125 mg total) by mouth 2 (two) times daily.    Dispense:  180 tablet    Refill:  3     Patient Instructions  Medication Instructions:  Your physician has recommended you make the following change in your medication:   START Coreg 3.125 mg taking 1 tablet twice a day   *If you need a refill on your cardiac medications before your next appointment, please call your pharmacy*   Lab Work: None ordered  If you have labs (blood work) drawn today and your tests are completely normal, you will receive your results only by: [place] (if you have MyChart) OR A paper copy in the mail If you have any lab test that is abnormal or we need to change your treatment, we will call you to review the results.   Testing/Procedures: None ordered   Follow-Up: At [facility], you and your health needs are our priority.  As part of our continuing mission to provide you with exceptional heart care, we have created designated Provider Care Teams.  These Care Teams include your primary Cardiologist (physician) and Advanced Practice Providers (APPs -  Physician Assistants and Nurse Practitioners) who all work together to provide you with the care you need, when you need it.  We recommend signing up for the patient portal called "MyChart".  Sign up information is provided on this After Visit Summary.  MyChart is used to connect with patients for Virtual Visits (Telemedicine).  Patients are able to view lab/test results, encounter notes, upcoming appointments, etc.  Non-urgent messages can  be sent to your provider as well.   To learn more about what you can do with MyChart, go to [email or web address].    Your next appointment:   12 month(s)  The format for your next appointment:   In Person  Provider:   You may see Sherren Mocha, MD or one of the following Advanced Practice Providers on your designated Care Team:   Richardson Dopp, PA-C Robbie Lis, PA-C   Other  Instructions    Signed, Leanor Kail, [place]  09/24/2020 4:22 PM    [place]

## 2020-11-05 DIAGNOSIS — J019 Acute sinusitis, unspecified: Secondary | ICD-10-CM | POA: Diagnosis not present

## 2020-12-25 ENCOUNTER — Other Ambulatory Visit: Payer: Self-pay | Admitting: Cardiovascular Disease

## 2020-12-26 DIAGNOSIS — R42 Dizziness and giddiness: Secondary | ICD-10-CM | POA: Diagnosis not present

## 2020-12-26 DIAGNOSIS — K921 Melena: Secondary | ICD-10-CM | POA: Diagnosis not present

## 2020-12-31 DIAGNOSIS — B351 Tinea unguium: Secondary | ICD-10-CM | POA: Diagnosis not present

## 2020-12-31 DIAGNOSIS — D225 Melanocytic nevi of trunk: Secondary | ICD-10-CM | POA: Diagnosis not present

## 2020-12-31 DIAGNOSIS — C4442 Squamous cell carcinoma of skin of scalp and neck: Secondary | ICD-10-CM | POA: Diagnosis not present

## 2020-12-31 DIAGNOSIS — L570 Actinic keratosis: Secondary | ICD-10-CM | POA: Diagnosis not present

## 2020-12-31 DIAGNOSIS — Z1283 Encounter for screening for malignant neoplasm of skin: Secondary | ICD-10-CM | POA: Diagnosis not present

## 2020-12-31 DIAGNOSIS — X32XXXD Exposure to sunlight, subsequent encounter: Secondary | ICD-10-CM | POA: Diagnosis not present

## 2021-01-01 ENCOUNTER — Other Ambulatory Visit: Payer: Self-pay

## 2021-01-01 ENCOUNTER — Other Ambulatory Visit (INDEPENDENT_AMBULATORY_CARE_PROVIDER_SITE_OTHER): Payer: Self-pay

## 2021-01-01 ENCOUNTER — Ambulatory Visit (INDEPENDENT_AMBULATORY_CARE_PROVIDER_SITE_OTHER): Payer: Medicare Other | Admitting: Gastroenterology

## 2021-01-01 ENCOUNTER — Encounter (INDEPENDENT_AMBULATORY_CARE_PROVIDER_SITE_OTHER): Payer: Self-pay | Admitting: Gastroenterology

## 2021-01-01 ENCOUNTER — Encounter (INDEPENDENT_AMBULATORY_CARE_PROVIDER_SITE_OTHER): Payer: Self-pay

## 2021-01-01 ENCOUNTER — Telehealth: Payer: Self-pay | Admitting: *Deleted

## 2021-01-01 ENCOUNTER — Telehealth (INDEPENDENT_AMBULATORY_CARE_PROVIDER_SITE_OTHER): Payer: Self-pay

## 2021-01-01 DIAGNOSIS — K921 Melena: Secondary | ICD-10-CM | POA: Insufficient documentation

## 2021-01-01 DIAGNOSIS — D649 Anemia, unspecified: Secondary | ICD-10-CM | POA: Insufficient documentation

## 2021-01-01 DIAGNOSIS — K2270 Barrett's esophagus without dysplasia: Secondary | ICD-10-CM

## 2021-01-01 MED ORDER — PEG 3350-KCL-NA BICARB-NACL 420 G PO SOLR: 4000.0000 mL | ORAL | 0 refills | Status: DC

## 2021-01-01 NOTE — Telephone Encounter (Signed)
° °  Pre-operative Risk Assessment    Patient Name: [name]  DOB: 05-Nov-1945 MRN: [id number]      Request for Surgical Clearance    Procedure:   COLONOSCOPY / ENDOSCOPY   Date of Surgery:  Clearance 01/20/21                                 Surgeon:  DR. Jenetta Downer Surgeon's Group or Practice Name:   [facility] Phone number:  [phone] Fax number:   [phone]   Type of Clearance Requested:   - Pharmacy:  Hold Clopidogrel (Plavix) 5 DAYS   Type of Anesthesia:  MAC   Additional requests/questions:    Astrid Divine   01/01/2021, 2:59 PM

## 2021-01-01 NOTE — Patient Instructions (Signed)
Schedule EGD and colonoscopy Follow up with urologist regarding blood in urine If possible stop taking Celebrex

## 2021-01-01 NOTE — H&P (View-Only) (Signed)
[name], M.D. Gastroenterology & Hepatology [facility] [address] [place], [place] [address] Primary Care Physician: Sharilyn Sites, MD [address] [place] [address]  Referring MD: PCP  Chief Complaint: Dark stools  History of Present Illness: [name] is a 75 y.o. male with past medical history of Barrett's esophagus, GERD, hyperlipidemia, hypertension, hypothyroidism, coronary artery disease status post CABG, prostate cancer, bladder cancer, who presents for evaluation of dark stools.  Patient reports that for the last 3 weeks he has presented dark stools which had him concerned. States stool has cleared up since then. Denies any fresh blood in stool. The patient denies having any nausea, vomiting, fever, chills, hematochezia, melena, hematemesis, abdominal distention, abdominal pain, diarrhea, jaundice, pruritus. States he lost 10 lb without a clear reason recently but has been slowly gaining it back recently.  Has felt slightly dizzy due to his vertigo.  Also mentions some hematuria, for which he is following with his urologist. He actually saw a blood clot in his urine in the morning.  Takes Celebrex for arhritis related pain. Has been taking it since age 40.  Denies heartburn, does not take any PPI.  Currently on Plavix.  Received more recent blood work-up from 12/27/2020 which showed a hemoglobin of 9.8 with MCV of 94, although cell count was 3.9 and platelets were 163.  Last EGD: 06/16/2017, normal proximal and mid esophagus.  There were changes consistent with Barrett's esophagus extending 1.5 cm from the GE junction consistent with history of short segment Barrett's esophagus, esophagus was empirically dilated with a Maloney dilator up to 42 [place], there were 4 white spots that were biopsied from the stomach (pathology), normal duodenum. Last Colonoscopy: 2014 Small polyp was ablated from the  transverse colon, multiple diverticuli in the sigmoid colon. Path TA.  FHx: neg for any gastrointestinal/liver disease, father prostate cancer, grandmother cancer - unknown type Social: neg smoking, frequent alcohol or illicit drug use Surgical: bladder surgery  Past Medical History: Past Medical History:  Diagnosis Date   Anginal pain ([place])    Arthritis    "all over"   Bladder tumor    Chronic lower back pain    Coronary artery disease    a. s/p CABG x 2 (LIMA->LAD, RIMA->RCA);  b. s/p multiple PCI's to Ramus;  c. 08/2011 Cath/PCI: LM 70% into ramus with 70-80% there->treated wtih 3.5x18 Xience Xpedition DES, LCX  nonobs, RCA occluded.  RIMA & LIMA patent, EF 55-65%   DJD (degenerative joint disease)    GERD (gastroesophageal reflux disease)    History of gout    Hyperlipidemia    Hypertension    Hypothyroidism    Neuropathy    OSA on CPAP    Prostate cancer [facility])     Past Surgical History: Past Surgical History:  Procedure Laterality Date   ABDOMINAL HERNIA REPAIR     BIOPSY  11/26/2015   Procedure: BIOPSY;  Surgeon: Rogene Houston, MD;  [facility];  Service: Endoscopy;;  gastric esophagus   BLADDER TUMOR EXCISION     BROW LIFT Bilateral 04/27/2019   Procedure: BILATERAL BLEPHAROPLASTY;  Surgeon: Baruch Goldmann, MD;  Location: [facility];  Service: Ophthalmology;  Laterality: Bilateral;   CARDIAC CATHETERIZATION N/A 04/10/2015   Procedure: Left Heart Cath and Cors/Grafts Angiography;  Surgeon: Sherren Mocha, MD;  Location: [place] CV LAB;  Service: Cardiovascular;  Laterality: N/A;   CARDIAC CATHETERIZATION N/A 04/10/2015   Procedure: Coronary Stent Intervention;  Surgeon: Sherren Mocha, MD;  Location: [place] CV LAB;  Service: Cardiovascular;  Laterality: N/A;   CATARACT EXTRACTION W/PHACO Right 06/23/2015   Procedure: CATARACT EXTRACTION PHACO AND INTRAOCULAR LENS PLACEMENT RIGHT EYE; CDE:  7.08;  Surgeon: Tonny Branch, MD;  Location: [facility];  Service:  Ophthalmology;  Laterality: Right;   CATARACT EXTRACTION W/PHACO Left 08/25/2015   Procedure: CATARACT EXTRACTION PHACO AND INTRAOCULAR LENS PLACEMENT LEFT EYE; CDE:  8.18;  Surgeon: Tonny Branch, MD;  Location: [facility];  Service: Ophthalmology;  Laterality: Left;   COLONOSCOPY N/A 12/28/2012   Procedure: COLONOSCOPY;  Surgeon: Rogene Houston, MD;  [facility];  Service: Endoscopy;  Laterality: N/A;  830 rescheduled   CORONARY ANGIOPLASTY WITH STENT PLACEMENT  2013; 04/10/2015   "this makes me a total of 7" (04/10/2015)   CORONARY ARTERY BYPASS GRAFT  1997   with (LIMA)   ESOPHAGEAL DILATION N/A 11/26/2015   Procedure: ESOPHAGEAL DILATION;  Surgeon: Rogene Houston, MD;  [facility];  Service: Endoscopy;  Laterality: N/A;   ESOPHAGEAL DILATION N/A 05/23/2017   Procedure: ESOPHAGEAL DILATION;  Surgeon: Rogene Houston, MD;  [facility];  Service: Endoscopy;  Laterality: N/A;   ESOPHAGEAL DILATION N/A 06/16/2017   Procedure: ESOPHAGEAL DILATION;  Surgeon: Rogene Houston, MD;  [facility];  Service: Endoscopy;  Laterality: N/A;   ESOPHAGOGASTRODUODENOSCOPY N/A 11/26/2015   Procedure: ESOPHAGOGASTRODUODENOSCOPY (EGD);  Surgeon: Rogene Houston, MD;  [facility];  Service: Endoscopy;  Laterality: N/A;  1:25   ESOPHAGOGASTRODUODENOSCOPY N/A 05/23/2017   Procedure: ESOPHAGOGASTRODUODENOSCOPY (EGD);  Surgeon: Rogene Houston, MD;  [facility];  Service: Endoscopy;  Laterality: N/A;  1:15   ESOPHAGOGASTRODUODENOSCOPY N/A 06/16/2017   Procedure: ESOPHAGOGASTRODUODENOSCOPY (EGD);  Surgeon: Rogene Houston, MD;  [facility];  Service: Endoscopy;  Laterality: N/A;  1240   HERNIA REPAIR Left    KNEE CARTILAGE SURGERY Bilateral    LEFT HEART CATH AND CORS/GRAFTS ANGIOGRAPHY N/A 09/08/2016   Procedure: LEFT HEART CATH AND CORS/GRAFTS ANGIOGRAPHY;  Surgeon: Martinique, Peter M, MD;  Location: [place] [facility];  Service: Cardiovascular;   Laterality: N/A;   LEFT HEART CATHETERIZATION WITH CORONARY ANGIOGRAM N/A 09/08/2011   Procedure: LEFT HEART CATHETERIZATION WITH CORONARY ANGIOGRAM;  Surgeon: Sherren Mocha, MD;  Location: [facility] CATH LAB;  Service: Cardiovascular;  Laterality: N/A;   PERCUTANEOUS CORONARY STENT INTERVENTION (PCI-S) Right 09/08/2011   Procedure: PERCUTANEOUS CORONARY STENT INTERVENTION (PCI-S);  Surgeon: Sherren Mocha, MD;  Location: [facility] CATH LAB;  Service: Cardiovascular;  Laterality: Right;   ROBOT ASSISTED LAPAROSCOPIC RADICAL PROSTATECTOMY      Family History: Family History  Problem Relation Age of Onset   Cancer Father 85       died   Stroke Mother 15       died    Social History: Social History   Tobacco Use  Smoking Status Former   Types: Cigars  Smokeless Tobacco Never  Tobacco Comments   "quit smoking in ~ 1978"   Social History   Substance and Sexual Activity  Alcohol Use Yes   Comment: occasionally    Social History   Substance and Sexual Activity  Drug Use No    Allergies: Allergies  Allergen Reactions   Codeine Nausea Only   Pollen Extract Other (See Comments)    Seasonal    Medications: Current Outpatient Medications  Medication Sig Dispense Refill   amLODipine (NORVASC) 10 MG tablet Take 1 tablet  by mouth daily.     aspirin 81 MG tablet Take 1 tablet (81 mg total) by mouth at bedtime. 30 tablet    atorvastatin (LIPITOR) 40 MG tablet TAKE 1 TABLET BY MOUTH EVERY DAY. 90 tablet 3   B Complex Vitamins (VITAMIN B COMPLEX) TABS Take 1 tablet by mouth daily.     Biotin 5000 MCG CAPS Take 5,000 mcg by mouth daily.     carvedilol (COREG) 3.125 MG tablet Take 1 tablet (3.125 mg total) by mouth 2 (two) times daily. 180 tablet 3   celecoxib (CELEBREX) 200 MG capsule Take 1 capsule (200 mg total) by mouth daily.     cetirizine (ZYRTEC) 10 MG tablet Take 10 mg by mouth daily.     Cholecalciferol 125 MCG (5000 UT) TABS Take 1 tablet by mouth daily. (Patient not taking:  Reported on 09/24/2020)     clopidogrel (PLAVIX) 75 MG tablet Take 1 tablet (75 mg total) by mouth in the morning. 90 tablet 3   cyanocobalamin (,VITAMIN B-12,) 1000 MCG/ML injection Inject 1,000 mcg into the muscle See admin instructions. Monthly as needed  2   gabapentin (NEURONTIN) 600 MG tablet Take 600 mg by mouth 3 (three) times daily.  3   glucosamine-chondroitin 500-400 MG tablet Take 1 tablet by mouth daily.     isosorbide mononitrate (IMDUR) 60 MG 24 hr tablet TAKE 1 TABLET BY MOUTH EVERY DAY 90 tablet 2   levothyroxine (SYNTHROID, LEVOTHROID) 150 MCG tablet Take 150 mcg by mouth daily before breakfast.     meclizine (ANTIVERT) 25 MG tablet Take 1 tablet (25 mg total) by mouth 2 (two) times daily as needed for dizziness. 30 tablet 0   Multiple Vitamin (MULTIVITAMIN) tablet Take 1 tablet by mouth every other day.     nitroGLYCERIN (NITROSTAT) 0.4 MG SL tablet Place 1 tablet (0.4 mg total) under the tongue every 5 (five) minutes as needed for chest pain. 25 tablet 3   Omega-3 Fatty Acids (FISH OIL) 1200 MG CAPS Take 1,200 mg by mouth daily.      Polyvinyl Alcohol-Povidone (REFRESH OP) Place 1 drop into both eyes daily as needed (dry eyes).     Potassium Gluconate 2.5 MEQ TABS Take 2 tablets by mouth at bedtime.     traMADol (ULTRAM) 50 MG tablet Take 50 mg by mouth 3 (three) times daily.  1   No current facility-administered medications for this visit.    Review of Systems: GENERAL: negative for malaise, night sweats HEENT: No changes in hearing or vision, no nose bleeds or other nasal problems. NECK: Negative for lumps, goiter, pain and significant neck swelling RESPIRATORY: Negative for cough, wheezing CARDIOVASCULAR: Negative for chest pain, leg swelling, palpitations, orthopnea GI: SEE HPI MUSCULOSKELETAL: Negative for joint pain or swelling, back pain, and muscle pain. SKIN: Negative for lesions, rash PSYCH: Negative for sleep disturbance, mood disorder and recent psychosocial  stressors. HEMATOLOGY Negative for prolonged bleeding, bruising easily, and swollen nodes. ENDOCRINE: Negative for cold or heat intolerance, polyuria, polydipsia and goiter. NEURO: negative for tremor, gait imbalance, syncope and seizures. The remainder of the review of systems is noncontributory.   Physical Exam: BP (!) 156/69 (BP Location: Right Arm, Patient Position: Sitting, Cuff Size: Large)    Pulse 61    Temp 97.9 F (36.6 C) (Oral)    Ht 6\' 1"  (1.854 m)    Wt 221 lb 4.8 oz (100.4 kg)    BMI 29.20 kg/m  GENERAL: The patient is AO x3, in no  acute distress. HEENT: Head is normocephalic and atraumatic. EOMI are intact. Mouth is well hydrated and without lesions. NECK: Supple. No masses LUNGS: Clear to auscultation. No presence of rhonchi/wheezing/rales. Adequate chest expansion HEART: RRR, normal s1 and s2. ABDOMEN: Soft, nontender, no guarding, no peritoneal signs, and nondistended. BS +. No masses. EXTREMITIES: Without any cyanosis, clubbing, rash, lesions or edema. NEUROLOGIC: AOx3, no focal motor deficit. SKIN: no jaundice, no rashes   Imaging/Labs: as above  I personally reviewed and interpreted the available labs, imaging and endoscopic files.  Impression and Plan: [name] is a 75 y.o. male with past medical history of Barrett's esophagus, GERD, hyperlipidemia, hypertension, hypothyroidism, coronary artery disease status post CABG, prostate cancer, bladder cancer, who presents for evaluation of dark stools.  The patient has presented recurrent episode of dark stools which could be related to melena in the setting of chronic Celebrex intake and concomitant use of Plavix.  He presented new onset anemia in his most recent blood work-up which further raises this concern.  We will explore this further with an EGD.  I advised the patient that ideally he should not take any NSAIDs but if not possible to avoid them then he will need to be on a PPI indefinitely.  We will also  examine his Barrett's esophagus at that time.  He is due for repeat colonoscopy given a history of colon polyps.  Both procedures will be performed at the same time.  - Schedule EGD and colonoscopy - Follow up with urologist regarding blood in urine - If possible stop taking Celebrex -May start PPI depending on findings on EGD.  All questions were answered.      Steve Peppers, MD Gastroenterology and Hepatology [facility]

## 2021-01-01 NOTE — Progress Notes (Signed)
[name], M.D. Gastroenterology & Hepatology [facility] [address] [place], [place] [address] Primary Care Physician: Sharilyn Sites, MD [address] [place] [address]  Referring MD: PCP  Chief Complaint: Dark stools  History of Present Illness: [name] is a 75 y.o. male with past medical history of Barrett's esophagus, GERD, hyperlipidemia, hypertension, hypothyroidism, coronary artery disease status post CABG, prostate cancer, bladder cancer, who presents for evaluation of dark stools.  Patient reports that for the last 3 weeks he has presented dark stools which had him concerned. States stool has cleared up since then. Denies any fresh blood in stool. The patient denies having any nausea, vomiting, fever, chills, hematochezia, melena, hematemesis, abdominal distention, abdominal pain, diarrhea, jaundice, pruritus. States he lost 10 lb without a clear reason recently but has been slowly gaining it back recently.  Has felt slightly dizzy due to his vertigo.  Also mentions some hematuria, for which he is following with his urologist. He actually saw a blood clot in his urine in the morning.  Takes Celebrex for arhritis related pain. Has been taking it since age 71.  Denies heartburn, does not take any PPI.  Currently on Plavix.  Received more recent blood work-up from 12/27/2020 which showed a hemoglobin of 9.8 with MCV of 94, although cell count was 3.9 and platelets were 163.  Last EGD: 06/16/2017, normal proximal and mid esophagus.  There were changes consistent with Barrett's esophagus extending 1.5 cm from the GE junction consistent with history of short segment Barrett's esophagus, esophagus was empirically dilated with a Maloney dilator up to 67 [place], there were 4 white spots that were biopsied from the stomach (pathology), normal duodenum. Last Colonoscopy: 2014 Small polyp was ablated from the  transverse colon, multiple diverticuli in the sigmoid colon. Path TA.  FHx: neg for any gastrointestinal/liver disease, father prostate cancer, grandmother cancer - unknown type Social: neg smoking, frequent alcohol or illicit drug use Surgical: bladder surgery  Past Medical History: Past Medical History:  Diagnosis Date   Anginal pain ([place])    Arthritis    "all over"   Bladder tumor    Chronic lower back pain    Coronary artery disease    a. s/p CABG x 2 (LIMA->LAD, RIMA->RCA);  b. s/p multiple PCI's to Ramus;  c. 08/2011 Cath/PCI: LM 70% into ramus with 70-80% there->treated wtih 3.5x18 Xience Xpedition DES, LCX  nonobs, RCA occluded.  RIMA & LIMA patent, EF 55-65%   DJD (degenerative joint disease)    GERD (gastroesophageal reflux disease)    History of gout    Hyperlipidemia    Hypertension    Hypothyroidism    Neuropathy    OSA on CPAP    Prostate cancer [facility])     Past Surgical History: Past Surgical History:  Procedure Laterality Date   ABDOMINAL HERNIA REPAIR     BIOPSY  11/26/2015   Procedure: BIOPSY;  Surgeon: Rogene Houston, MD;  [facility];  Service: Endoscopy;;  gastric esophagus   BLADDER TUMOR EXCISION     BROW LIFT Bilateral 04/27/2019   Procedure: BILATERAL BLEPHAROPLASTY;  Surgeon: Baruch Goldmann, MD;  Location: [facility] ORS;  Service: Ophthalmology;  Laterality: Bilateral;   CARDIAC CATHETERIZATION N/A 04/10/2015   Procedure: Left Heart Cath and Cors/Grafts Angiography;  Surgeon: Sherren Mocha, MD;  Location: [place] CV LAB;  Service: Cardiovascular;  Laterality: N/A;   CARDIAC CATHETERIZATION N/A 04/10/2015   Procedure: Coronary Stent Intervention;  Surgeon: Sherren Mocha, MD;  Location: [place] CV LAB;  Service: Cardiovascular;  Laterality: N/A;   CATARACT EXTRACTION W/PHACO Right 06/23/2015   Procedure: CATARACT EXTRACTION PHACO AND INTRAOCULAR LENS PLACEMENT RIGHT EYE; CDE:  7.08;  Surgeon: Tonny Branch, MD;  Location: [facility] OR[facility];  Service:  Ophthalmology;  Laterality: Right;   CATARACT EXTRACTION W/PHACO Left 08/25/2015   Procedure: CATARACT EXTRACTION PHACO AND INTRAOCULAR LENS PLACEMENT LEFT EYE; CDE:  8.18;  Surgeon: Tonny Branch, MD;  Location: [facility];  Service: Ophthalmology;  Laterality: Left;   COLONOSCOPY N/A 12/28/2012   Procedure: COLONOSCOPY;  Surgeon: Rogene Houston, MD;  [facility];  Service: Endoscopy;  Laterality: N/A;  830 rescheduled   CORONARY ANGIOPLASTY WITH STENT PLACEMENT  2013; 04/10/2015   "this makes me a total of 7" (04/10/2015)   CORONARY ARTERY BYPASS GRAFT  1997   with (LIMA)   ESOPHAGEAL DILATION N/A 11/26/2015   Procedure: ESOPHAGEAL DILATION;  Surgeon: Rogene Houston, MD;  [facility];  Service: Endoscopy;  Laterality: N/A;   ESOPHAGEAL DILATION N/A 05/23/2017   Procedure: ESOPHAGEAL DILATION;  Surgeon: Rogene Houston, MD;  [facility];  Service: Endoscopy;  Laterality: N/A;   ESOPHAGEAL DILATION N/A 06/16/2017   Procedure: ESOPHAGEAL DILATION;  Surgeon: Rogene Houston, MD;  [facility];  Service: Endoscopy;  Laterality: N/A;   ESOPHAGOGASTRODUODENOSCOPY N/A 11/26/2015   Procedure: ESOPHAGOGASTRODUODENOSCOPY (EGD);  Surgeon: Rogene Houston, MD;  [facility];  Service: Endoscopy;  Laterality: N/A;  1:25   ESOPHAGOGASTRODUODENOSCOPY N/A 05/23/2017   Procedure: ESOPHAGOGASTRODUODENOSCOPY (EGD);  Surgeon: Rogene Houston, MD;  [facility];  Service: Endoscopy;  Laterality: N/A;  1:15   ESOPHAGOGASTRODUODENOSCOPY N/A 06/16/2017   Procedure: ESOPHAGOGASTRODUODENOSCOPY (EGD);  Surgeon: Rogene Houston, MD;  [facility];  Service: Endoscopy;  Laterality: N/A;  1240   HERNIA REPAIR Left    KNEE CARTILAGE SURGERY Bilateral    LEFT HEART CATH AND CORS/GRAFTS ANGIOGRAPHY N/A 09/08/2016   Procedure: LEFT HEART CATH AND CORS/GRAFTS ANGIOGRAPHY;  Surgeon: Martinique, Peter M, MD;  Location: [place] [facility] LAB;  Service: Cardiovascular;   Laterality: N/A;   LEFT HEART CATHETERIZATION WITH CORONARY ANGIOGRAM N/A 09/08/2011   Procedure: LEFT HEART CATHETERIZATION WITH CORONARY ANGIOGRAM;  Surgeon: Sherren Mocha, MD;  Location: [facility] CATH LAB;  Service: Cardiovascular;  Laterality: N/A;   PERCUTANEOUS CORONARY STENT INTERVENTION (PCI-S) Right 09/08/2011   Procedure: PERCUTANEOUS CORONARY STENT INTERVENTION (PCI-S);  Surgeon: Sherren Mocha, MD;  Location: [facility] CATH LAB;  Service: Cardiovascular;  Laterality: Right;   ROBOT ASSISTED LAPAROSCOPIC RADICAL PROSTATECTOMY      Family History: Family History  Problem Relation Age of Onset   Cancer Father 39       died   Stroke Mother 11       died    Social History: Social History   Tobacco Use  Smoking Status Former   Types: Cigars  Smokeless Tobacco Never  Tobacco Comments   "quit smoking in ~ 1978"   Social History   Substance and Sexual Activity  Alcohol Use Yes   Comment: occasionally    Social History   Substance and Sexual Activity  Drug Use No    Allergies: Allergies  Allergen Reactions   Codeine Nausea Only   Pollen Extract Other (See Comments)    Seasonal    Medications: Current Outpatient Medications  Medication Sig Dispense Refill   amLODipine (NORVASC) 10 MG tablet Take 1 tablet  by mouth daily.     aspirin 81 MG tablet Take 1 tablet (81 mg total) by mouth at bedtime. 30 tablet    atorvastatin (LIPITOR) 40 MG tablet TAKE 1 TABLET BY MOUTH EVERY DAY. 90 tablet 3   B Complex Vitamins (VITAMIN B COMPLEX) TABS Take 1 tablet by mouth daily.     Biotin 5000 MCG CAPS Take 5,000 mcg by mouth daily.     carvedilol (COREG) 3.125 MG tablet Take 1 tablet (3.125 mg total) by mouth 2 (two) times daily. 180 tablet 3   celecoxib (CELEBREX) 200 MG capsule Take 1 capsule (200 mg total) by mouth daily.     cetirizine (ZYRTEC) 10 MG tablet Take 10 mg by mouth daily.     Cholecalciferol 125 MCG (5000 UT) TABS Take 1 tablet by mouth daily. (Patient not taking:  Reported on 09/24/2020)     clopidogrel (PLAVIX) 75 MG tablet Take 1 tablet (75 mg total) by mouth in the morning. 90 tablet 3   cyanocobalamin (,VITAMIN B-12,) 1000 MCG/ML injection Inject 1,000 mcg into the muscle See admin instructions. Monthly as needed  2   gabapentin (NEURONTIN) 600 MG tablet Take 600 mg by mouth 3 (three) times daily.  3   glucosamine-chondroitin 500-400 MG tablet Take 1 tablet by mouth daily.     isosorbide mononitrate (IMDUR) 60 MG 24 hr tablet TAKE 1 TABLET BY MOUTH EVERY DAY 90 tablet 2   levothyroxine (SYNTHROID, LEVOTHROID) 150 MCG tablet Take 150 mcg by mouth daily before breakfast.     meclizine (ANTIVERT) 25 MG tablet Take 1 tablet (25 mg total) by mouth 2 (two) times daily as needed for dizziness. 30 tablet 0   Multiple Vitamin (MULTIVITAMIN) tablet Take 1 tablet by mouth every other day.     nitroGLYCERIN (NITROSTAT) 0.4 MG SL tablet Place 1 tablet (0.4 mg total) under the tongue every 5 (five) minutes as needed for chest pain. 25 tablet 3   Omega-3 Fatty Acids (FISH OIL) 1200 MG CAPS Take 1,200 mg by mouth daily.      Polyvinyl Alcohol-Povidone (REFRESH OP) Place 1 drop into both eyes daily as needed (dry eyes).     Potassium Gluconate 2.5 MEQ TABS Take 2 tablets by mouth at bedtime.     traMADol (ULTRAM) 50 MG tablet Take 50 mg by mouth 3 (three) times daily.  1   No current facility-administered medications for this visit.    Review of Systems: GENERAL: negative for malaise, night sweats HEENT: No changes in hearing or vision, no nose bleeds or other nasal problems. NECK: Negative for lumps, goiter, pain and significant neck swelling RESPIRATORY: Negative for cough, wheezing CARDIOVASCULAR: Negative for chest pain, leg swelling, palpitations, orthopnea GI: SEE HPI MUSCULOSKELETAL: Negative for joint pain or swelling, back pain, and muscle pain. SKIN: Negative for lesions, rash PSYCH: Negative for sleep disturbance, mood disorder and recent psychosocial  stressors. HEMATOLOGY Negative for prolonged bleeding, bruising easily, and swollen nodes. ENDOCRINE: Negative for cold or heat intolerance, polyuria, polydipsia and goiter. NEURO: negative for tremor, gait imbalance, syncope and seizures. The remainder of the review of systems is noncontributory.   Physical Exam: BP (!) 156/69 (BP Location: Right Arm, Patient Position: Sitting, Cuff Size: Large)    Pulse 61    Temp 97.9 F (36.6 C) (Oral)    Ht 6\' 1"  (1.854 m)    Wt 221 lb 4.8 oz (100.4 kg)    BMI 29.20 kg/m  GENERAL: The patient is AO x3, in no  acute distress. HEENT: Head is normocephalic and atraumatic. EOMI are intact. Mouth is well hydrated and without lesions. NECK: Supple. No masses LUNGS: Clear to auscultation. No presence of rhonchi/wheezing/rales. Adequate chest expansion HEART: RRR, normal s1 and s2. ABDOMEN: Soft, nontender, no guarding, no peritoneal signs, and nondistended. BS +. No masses. EXTREMITIES: Without any cyanosis, clubbing, rash, lesions or edema. NEUROLOGIC: AOx3, no focal motor deficit. SKIN: no jaundice, no rashes   Imaging/Labs: as above  I personally reviewed and interpreted the available labs, imaging and endoscopic files.  Impression and Plan: SHANN MERRICK is a 75 y.o. male with past medical history of Barrett's esophagus, GERD, hyperlipidemia, hypertension, hypothyroidism, coronary artery disease status post CABG, prostate cancer, bladder cancer, who presents for evaluation of dark stools.  The patient has presented recurrent episode of dark stools which could be related to melena in the setting of chronic Celebrex intake and concomitant use of Plavix.  He presented new onset anemia in his most recent blood work-up which further raises this concern.  We will explore this further with an EGD.  I advised the patient that ideally he should not take any NSAIDs but if not possible to avoid them then he will need to be on a PPI indefinitely.  We will also  examine his Barrett's esophagus at that time.  He is due for repeat colonoscopy given a history of colon polyps.  Both procedures will be performed at the same time.  - Schedule EGD and colonoscopy - Follow up with urologist regarding blood in urine - If possible stop taking Celebrex -May start PPI depending on findings on EGD.  All questions were answered.      Steve Peppers, MD Gastroenterology and Hepatology [facility]

## 2021-01-01 NOTE — Telephone Encounter (Signed)
Patient with a history of CAD s/p CABG in 1997, PCI in March 2017 (overlapping DES in distal RCA through the graft to the RCA). Last cath 8/18 showed patent grafts and stents. Last seen 09/24/20 by Robbie Lis, PA, no symptoms of angina. Walking 1-2 miles a few times per day without issue.   Routing message to primary cardiologist, Dr. Burt Knack, for recommendations to hold Plavix x 5 days.  Please direct your response to cv div preop

## 2021-01-01 NOTE — Telephone Encounter (Signed)
LeighAnn Ruta Capece, CMA  

## 2021-01-05 NOTE — Telephone Encounter (Signed)
Pt at low risk of holding plavix x 5 days for procedure. OK to do so.   thx

## 2021-01-06 ENCOUNTER — Encounter (INDEPENDENT_AMBULATORY_CARE_PROVIDER_SITE_OTHER): Payer: Self-pay

## 2021-01-06 NOTE — Telephone Encounter (Signed)
° °  Primary Cardiologist: Sherren Mocha, MD  Chart reviewed as part of pre-operative protocol coverage. Given past medical history and time since last visit, based on ACC/AHA guidelines, [name] would be at acceptable risk for the planned procedure without further cardiovascular testing.   Per Dr. Antionette Char recommendation, patient may hold Plavix for 5 days prior to procedure. Please resume Plavix as soon as hemostasis achieved, at the discretion of the surgeon.   I will route this recommendation to the requesting party via Epic fax function and remove from pre-op pool.  Please call with questions.  Lenna Sciara, NP 01/06/2021, 8:23 AM

## 2021-01-13 NOTE — Patient Instructions (Signed)
[name]  01/13/2021     @PREFPERIOPPHARMACY @   Your procedure is scheduled on  01/20/2021.   Report to [facility] at  [place].   Call this number if you have problems the morning of surgery:  [phone]   Remember:  Follow the diet and prep instructions given to you by the office.    Your last dose of plavix should be on 01/14/2021.    Take these medicines the morning of surgery with A SIP OF WATER         amlodipine, carvedilol, celebrex, valium(if needed), gabapentin, isosorbide, levothyroxine, antivert(If needed), tramadol(if needed).     Do not wear jewelry, make-up or nail polish.  Do not wear lotions, powders, or perfumes, or deodorant.  Do not shave 48 hours prior to surgery.  Men may shave face and neck.  Do not bring valuables to the hospital.  [facility] is not responsible for any belongings or valuables.  Contacts, dentures or bridgework may not be worn into surgery.  Leave your suitcase in the car.  After surgery it may be brought to your room.  For patients admitted to the hospital, discharge time will be determined by your treatment team.  Patients discharged the day of surgery will not be allowed to drive home and must have someone with them for 24 hours.    Special instructions:   DO NOT smoke tobacco or vape for 24 hours before your procedure.  Please read over the following fact sheets that you were given. Anesthesia Post-op Instructions and Care and Recovery After Surgery      Upper Endoscopy, Adult, Care After This sheet gives you information about how to care for yourself after your procedure. Your health care provider may also give you more specific instructions. If you have problems or questions, contact your health care provider. What can I expect after the procedure? After the procedure, it is common to have: A sore throat. Mild stomach pain or discomfort. Bloating. Nausea. Follow these instructions at  home:  Follow instructions from your health care provider about what to eat or drink after your procedure. Return to your normal activities as told by your health care provider. Ask your health care provider what activities are safe for you. Take over-the-counter and prescription medicines only as told by your health care provider. If you were given a sedative during the procedure, it can affect you for several hours. Do not drive or operate machinery until your health care provider says that it is safe. Keep all follow-up visits as told by your health care provider. This is important. Contact a health care provider if you have: A sore throat that lasts longer than one day. Trouble swallowing. Get help right away if: You vomit blood or your vomit looks like coffee grounds. You have: A fever. Bloody, black, or tarry stools. A severe sore throat or you cannot swallow. Difficulty breathing. Severe pain in your chest or abdomen. Summary After the procedure, it is common to have a sore throat, mild stomach discomfort, bloating, and nausea. If you were given a sedative during the procedure, it can affect you for several hours. Do not drive or operate machinery until your health care provider says that it is safe. Follow instructions from your health care provider about what to eat or drink after your procedure. Return to your normal activities as told by your health care provider. This information is not intended to replace  advice given to you by your health care provider. Make sure you discuss any questions you have with your health care provider. Document Revised: 11/03/2018 Document Reviewed: 05/30/2017 Elsevier Patient Education  2022 [place]. Colonoscopy, Adult, Care After This sheet gives you information about how to care for yourself after your procedure. Your health care provider may also give you more specific instructions. If you have problems or questions, contact your health  care provider. What can I expect after the procedure? After the procedure, it is common to have: A small amount of blood in your stool for 24 hours after the procedure. Some gas. Mild cramping or bloating of your abdomen. Follow these instructions at home: Eating and drinking  Drink enough fluid to keep your urine pale yellow. Follow instructions from your health care provider about eating or drinking restrictions. Resume your normal diet as instructed by your health care provider. Avoid heavy or fried foods that are hard to digest. Activity Rest as told by your health care provider. Avoid sitting for a long time without moving. Get up to take short walks every 1-2 hours. This is important to improve blood flow and breathing. Ask for help if you feel weak or unsteady. Return to your normal activities as told by your health care provider. Ask your health care provider what activities are safe for you. Managing cramping and bloating  Try walking around when you have cramps or feel bloated. Apply heat to your abdomen as told by your health care provider. Use the heat source that your health care provider recommends, such as a moist heat pack or a heating pad. Place a towel between your skin and the heat source. Leave the heat on for 20-30 minutes. Remove the heat if your skin turns bright red. This is especially important if you are unable to feel pain, heat, or cold. You may have a greater risk of getting burned. General instructions If you were given a sedative during the procedure, it can affect you for several hours. Do not drive or operate machinery until your health care provider says that it is safe. For the first 24 hours after the procedure: Do not sign important documents. Do not drink alcohol. Do your regular daily activities at a slower pace than normal. Eat soft foods that are easy to digest. Take over-the-counter and prescription medicines only as told by your health care  provider. Keep all follow-up visits as told by your health care provider. This is important. Contact a health care provider if: You have blood in your stool 2-3 days after the procedure. Get help right away if you have: More than a small spotting of blood in your stool. Large blood clots in your stool. Swelling of your abdomen. Nausea or vomiting. A fever. Increasing pain in your abdomen that is not relieved with medicine. Summary After the procedure, it is common to have a small amount of blood in your stool. You may also have mild cramping and bloating of your abdomen. If you were given a sedative during the procedure, it can affect you for several hours. Do not drive or operate machinery until your health care provider says that it is safe. Get help right away if you have a lot of blood in your stool, nausea or vomiting, a fever, or increased pain in your abdomen. This information is not intended to replace advice given to you by your health care provider. Make sure you discuss any questions you have with your health care provider.  Document Revised: 11/03/2018 Document Reviewed: 07/24/2018 Elsevier Patient Education  [place] After This sheet gives you information about how to care for yourself after your procedure. Your health care provider may also give you more specific instructions. If you have problems or questions, contact your health care provider. What can I expect after the procedure? After the procedure, it is common to have: Tiredness. Forgetfulness about what happened after the procedure. Impaired judgment for important decisions. Nausea or vomiting. Some difficulty with balance. Follow these instructions at home: For the time period you were told by your health care provider:   Rest as needed. Do not participate in activities where you could fall or become injured. Do not drive or use machinery. Do not drink alcohol. Do not  take sleeping pills or medicines that cause drowsiness. Do not make important decisions or sign legal documents. Do not take care of children on your own. Eating and drinking Follow the diet that is recommended by your health care provider. Drink enough fluid to keep your urine pale yellow. If you vomit: Drink water, juice, or soup when you can drink without vomiting. Make sure you have little or no nausea before eating solid foods. General instructions Have a responsible adult stay with you for the time you are told. It is important to have someone help care for you until you are awake and alert. Take over-the-counter and prescription medicines only as told by your health care provider. If you have sleep apnea, surgery and certain medicines can increase your risk for breathing problems. Follow instructions from your health care provider about wearing your sleep device: Anytime you are sleeping, including during daytime naps. While taking prescription pain medicines, sleeping medicines, or medicines that make you drowsy. Avoid smoking. Keep all follow-up visits as told by your health care provider. This is important. Contact a health care provider if: You keep feeling nauseous or you keep vomiting. You feel light-headed. You are still sleepy or having trouble with balance after 24 hours. You develop a rash. You have a fever. You have redness or swelling around the IV site. Get help right away if: You have trouble breathing. You have new-onset confusion at home. Summary For several hours after your procedure, you may feel tired. You may also be forgetful and have poor judgment. Have a responsible adult stay with you for the time you are told. It is important to have someone help care for you until you are awake and alert. Rest as told. Do not drive or operate machinery. Do not drink alcohol or take sleeping pills. Get help right away if you have trouble breathing, or if you suddenly  become confused. This information is not intended to replace advice given to you by your health care provider. Make sure you discuss any questions you have with your health care provider. Document Revised: 09/13/2019 Document Reviewed: 11/30/2018 Elsevier Patient Education  2022 [employer].

## 2021-01-14 ENCOUNTER — Encounter (HOSPITAL_COMMUNITY): Payer: Self-pay

## 2021-01-14 ENCOUNTER — Other Ambulatory Visit: Payer: Self-pay

## 2021-01-15 ENCOUNTER — Encounter (HOSPITAL_COMMUNITY)
Admission: RE | Admit: 2021-01-15 | Discharge: 2021-01-15 | Disposition: A | Discharge status: Home / Self Care | Payer: Medicare Other | Source: Ambulatory Visit | Attending: Gastroenterology | Admitting: Gastroenterology

## 2021-01-16 ENCOUNTER — Other Ambulatory Visit (INDEPENDENT_AMBULATORY_CARE_PROVIDER_SITE_OTHER): Payer: Self-pay

## 2021-01-20 ENCOUNTER — Encounter (HOSPITAL_COMMUNITY)
Admission: RE | Disposition: A | Discharge status: Home / Self Care | Payer: Self-pay | Source: Home / Self Care | Attending: Gastroenterology

## 2021-01-20 ENCOUNTER — Other Ambulatory Visit: Payer: Self-pay

## 2021-01-20 ENCOUNTER — Ambulatory Visit (HOSPITAL_COMMUNITY): Payer: Medicare Other | Admitting: Anesthesiology

## 2021-01-20 ENCOUNTER — Ambulatory Visit (HOSPITAL_COMMUNITY)
Admission: RE | Admit: 2021-01-20 | Discharge: 2021-01-20 | Disposition: A | Discharge status: Home / Self Care | Payer: Medicare Other | Attending: Gastroenterology | Admitting: Gastroenterology

## 2021-01-20 ENCOUNTER — Encounter (HOSPITAL_COMMUNITY): Payer: Self-pay | Admitting: Gastroenterology

## 2021-01-20 DIAGNOSIS — I10 Essential (primary) hypertension: Secondary | ICD-10-CM | POA: Insufficient documentation

## 2021-01-20 DIAGNOSIS — D122 Benign neoplasm of ascending colon: Secondary | ICD-10-CM | POA: Diagnosis not present

## 2021-01-20 DIAGNOSIS — Z09 Encounter for follow-up examination after completed treatment for conditions other than malignant neoplasm: Secondary | ICD-10-CM | POA: Diagnosis not present

## 2021-01-20 DIAGNOSIS — K635 Polyp of colon: Secondary | ICD-10-CM

## 2021-01-20 DIAGNOSIS — K31A0 Gastric intestinal metaplasia, unspecified: Secondary | ICD-10-CM | POA: Diagnosis not present

## 2021-01-20 DIAGNOSIS — K2950 Unspecified chronic gastritis without bleeding: Secondary | ICD-10-CM | POA: Diagnosis not present

## 2021-01-20 DIAGNOSIS — K317 Polyp of stomach and duodenum: Secondary | ICD-10-CM | POA: Diagnosis not present

## 2021-01-20 DIAGNOSIS — E039 Hypothyroidism, unspecified: Secondary | ICD-10-CM | POA: Diagnosis not present

## 2021-01-20 DIAGNOSIS — D509 Iron deficiency anemia, unspecified: Secondary | ICD-10-CM | POA: Insufficient documentation

## 2021-01-20 DIAGNOSIS — K5730 Diverticulosis of large intestine without perforation or abscess without bleeding: Secondary | ICD-10-CM | POA: Insufficient documentation

## 2021-01-20 DIAGNOSIS — Z1211 Encounter for screening for malignant neoplasm of colon: Secondary | ICD-10-CM | POA: Insufficient documentation

## 2021-01-20 DIAGNOSIS — K921 Melena: Secondary | ICD-10-CM | POA: Insufficient documentation

## 2021-01-20 DIAGNOSIS — D649 Anemia, unspecified: Secondary | ICD-10-CM | POA: Diagnosis not present

## 2021-01-20 DIAGNOSIS — Z87891 Personal history of nicotine dependence: Secondary | ICD-10-CM | POA: Diagnosis not present

## 2021-01-20 DIAGNOSIS — R319 Hematuria, unspecified: Secondary | ICD-10-CM | POA: Diagnosis not present

## 2021-01-20 DIAGNOSIS — E785 Hyperlipidemia, unspecified: Secondary | ICD-10-CM | POA: Diagnosis not present

## 2021-01-20 DIAGNOSIS — K2289 Other specified disease of esophagus: Secondary | ICD-10-CM | POA: Diagnosis not present

## 2021-01-20 DIAGNOSIS — Z951 Presence of aortocoronary bypass graft: Secondary | ICD-10-CM | POA: Insufficient documentation

## 2021-01-20 DIAGNOSIS — K648 Other hemorrhoids: Secondary | ICD-10-CM | POA: Diagnosis not present

## 2021-01-20 DIAGNOSIS — I2510 Atherosclerotic heart disease of native coronary artery without angina pectoris: Secondary | ICD-10-CM | POA: Insufficient documentation

## 2021-01-20 DIAGNOSIS — M1990 Unspecified osteoarthritis, unspecified site: Secondary | ICD-10-CM | POA: Diagnosis not present

## 2021-01-20 DIAGNOSIS — K2270 Barrett's esophagus without dysplasia: Secondary | ICD-10-CM | POA: Diagnosis not present

## 2021-01-20 DIAGNOSIS — K259 Gastric ulcer, unspecified as acute or chronic, without hemorrhage or perforation: Secondary | ICD-10-CM | POA: Diagnosis not present

## 2021-01-20 DIAGNOSIS — K2940 Chronic atrophic gastritis without bleeding: Secondary | ICD-10-CM | POA: Diagnosis not present

## 2021-01-20 DIAGNOSIS — Z86010 Personal history of colonic polyps: Secondary | ICD-10-CM | POA: Diagnosis not present

## 2021-01-20 DIAGNOSIS — K31A19 Gastric intestinal metaplasia without dysplasia, unspecified site: Secondary | ICD-10-CM | POA: Diagnosis not present

## 2021-01-20 DIAGNOSIS — K3189 Other diseases of stomach and duodenum: Secondary | ICD-10-CM

## 2021-01-20 DIAGNOSIS — Z791 Long term (current) use of non-steroidal anti-inflammatories (NSAID): Secondary | ICD-10-CM | POA: Insufficient documentation

## 2021-01-20 DIAGNOSIS — K219 Gastro-esophageal reflux disease without esophagitis: Secondary | ICD-10-CM | POA: Insufficient documentation

## 2021-01-20 DIAGNOSIS — C679 Malignant neoplasm of bladder, unspecified: Secondary | ICD-10-CM | POA: Insufficient documentation

## 2021-01-20 DIAGNOSIS — G4730 Sleep apnea, unspecified: Secondary | ICD-10-CM | POA: Insufficient documentation

## 2021-01-20 DIAGNOSIS — Z862 Personal history of diseases of the blood and blood-forming organs and certain disorders involving the immune mechanism: Secondary | ICD-10-CM | POA: Diagnosis not present

## 2021-01-20 DIAGNOSIS — R42 Dizziness and giddiness: Secondary | ICD-10-CM | POA: Diagnosis not present

## 2021-01-20 DIAGNOSIS — C61 Malignant neoplasm of prostate: Secondary | ICD-10-CM | POA: Insufficient documentation

## 2021-01-20 DIAGNOSIS — Z7902 Long term (current) use of antithrombotics/antiplatelets: Secondary | ICD-10-CM | POA: Insufficient documentation

## 2021-01-20 HISTORY — PX: ESOPHAGOGASTRODUODENOSCOPY (EGD) WITH PROPOFOL: SHX5813

## 2021-01-20 HISTORY — PX: BIOPSY: SHX5522

## 2021-01-20 HISTORY — PX: COLONOSCOPY WITH PROPOFOL: SHX5780

## 2021-01-20 HISTORY — PX: POLYPECTOMY: SHX5525

## 2021-01-20 LAB — IRON AND TIBC
Iron: 96 ug/dL (ref 45–182)
Saturation Ratios: 21 % (ref 17.9–39.5)
TIBC: 453 ug/dL — ABNORMAL HIGH (ref 250–450)
UIBC: 357 ug/dL

## 2021-01-20 LAB — HM COLONOSCOPY

## 2021-01-20 LAB — FERRITIN: Ferritin: 9 ng/mL — ABNORMAL LOW (ref 24–336)

## 2021-01-20 SURGERY — COLONOSCOPY WITH PROPOFOL
Anesthesia: General

## 2021-01-20 MED ORDER — PROPOFOL 10 MG/ML IV BOLUS
INTRAVENOUS | Status: DC | PRN
  Administered 2021-01-20: 50 mg via INTRAVENOUS

## 2021-01-20 MED ORDER — LACTATED RINGERS IV SOLN: INTRAVENOUS | Status: DC

## 2021-01-20 MED ORDER — PROPOFOL 500 MG/50ML IV EMUL
INTRAVENOUS | Status: DC | PRN
  Administered 2021-01-20: 150 ug/kg/min via INTRAVENOUS

## 2021-01-20 NOTE — Interval H&P Note (Signed)
History and Physical Interval Note:  01/20/2021 10:10 AM  [name]  has presented today for surgery, with the diagnosis of Hx of colonic polyps Melena Anemia.  The various methods of treatment have been discussed with the patient and family. After consideration of risks, benefits and other options for treatment, the patient has consented to  Procedure(s) with comments: COLONOSCOPY WITH PROPOFOL (N/A) - 11:05 ESOPHAGOGASTRODUODENOSCOPY (EGD) WITH PROPOFOL (N/A) as a surgical intervention.  The patient's history has been reviewed, patient examined, no change in status, stable for surgery.  I have reviewed the patient's chart and labs.  Questions were answered to the patient's satisfaction.     [name]

## 2021-01-20 NOTE — Op Note (Signed)
[facility] Patient Name: [name] Procedure Date: 01/20/2021 11:31 AM MRN: [id number] Date of Birth: 08/23/1945 Attending MD: [name] ,  CSN: [id number] Age: 76 Admit Type: Outpatient Procedure:                Upper GI endoscopy Indications:              Iron deficiency anemia Providers:                [name], Hughie Closs, RN, Janeece Riggers,                            RN, Aram Candela Referring MD:              Medicines:                Monitored Anesthesia Care Complications:            No immediate complications. Estimated Blood Loss:     Estimated blood loss: none. Procedure:                Pre-Anesthesia Assessment:                           - Prior to the procedure, a History and Physical                            was performed, and patient medications, allergies                            and sensitivities were reviewed. The patient's                            tolerance of previous anesthesia was reviewed.                           - The risks and benefits of the procedure and the                            sedation options and risks were discussed with the                            patient. All questions were answered and informed                            consent was obtained.                           - ASA Grade Assessment: II - A patient with mild                            systemic disease.                           After obtaining informed consent, the endoscope was                            passed under direct vision. Throughout the  procedure, the patient's blood pressure, pulse, and                            oxygen saturations were monitored continuously. The                            [id number]-H190 ([id number]) scope was introduced through the                            mouth, and advanced to the second part of duodenum.                            The upper GI endoscopy was accomplished without                             difficulty. The patient tolerated the procedure                            well. Scope In: 11:50:06 AM Scope Out: 11:59:10 AM Total Procedure Duration: 0 hours 9 minutes 4 seconds  Findings:      The examined esophagus was normal.      The Z-line was irregular and was found 41 cm from the incisors.      Multiple 2 mm semi-sessile polyps with no bleeding were found in the       gastric fundus and in the gastric body. Four of these polyps were       removed with a cold biopsy forceps. Resection and retrieval were       complete.      A few localized diminutive erosions with no stigmata of recent bleeding       were found in the gastric antrum. Biopsies were taken with a cold       forceps for Helicobacter pylori testing.      The examined duodenum was normal. Biopsies were taken with a cold       forceps for histology. Impression:               - Normal esophagus.                           - Z-line irregular, 41 cm from the incisors.                           - Multiple gastric polyps. Resected and retrieved.                           - Erosive gastropathy with no stigmata of recent                            bleeding. Biopsied.                           - Normal examined duodenum. Biopsied. Moderate Sedation:      Per Anesthesia Care Recommendation:           - Discharge patient to home (ambulatory).                           -  Resume previous diet.                           - Await pathology results.                           - check iron stores today Procedure Code(s):        --- Professional ---                           [phone], Esophagogastroduodenoscopy, flexible,                            transoral; with biopsy, single or multiple Diagnosis Code(s):        --- Professional ---                           K22.8, Other specified diseases of esophagus                           K31.7, Polyp of stomach and duodenum                           K31.89, Other diseases of stomach  and duodenum                           D50.9, Iron deficiency anemia, unspecified CPT copyright 2019 American Medical Association. All rights reserved. The codes documented in this report are preliminary and upon coder review may  be revised to meet current compliance requirements. [name], MD [name],  01/20/2021 12:48:07 PM This report has been signed electronically. Number of Addenda: 0

## 2021-01-20 NOTE — Op Note (Signed)
[facility] Patient Name: [name] Procedure Date: 01/20/2021 12:05 PM MRN: [id number] Date of Birth: Oct 23, 1945 Attending MD: [name] ,  CSN: [id number] Age: 76 Admit Type: Ambulatory Procedure:                Colonoscopy Indications:              High risk colon cancer surveillance: Personal                            history of adenoma less than 10 mm in size Providers:                [name], Janeece Riggers, RN, Hughie Closs,                            RN, Aram Candela Referring MD:              Medicines:                Monitored Anesthesia Care Complications:            No immediate complications. Estimated Blood Loss:     Estimated blood loss: none. Procedure:                Pre-Anesthesia Assessment:                           - Prior to the procedure, a History and Physical                            was performed, and patient medications, allergies                            and sensitivities were reviewed. The patient's                            tolerance of previous anesthesia was reviewed.                           - The risks and benefits of the procedure and the                            sedation options and risks were discussed with the                            patient. All questions were answered and informed                            consent was obtained.                           - ASA Grade Assessment: II - A patient with mild                            systemic disease.                           After obtaining informed consent, the  colonoscope                            was passed under direct vision. Throughout the                            procedure, the patient's blood pressure, pulse, and                            oxygen saturations were monitored continuously. The                            [id number]-[id number] ([id number]) scope was introduced through                            the anus and advanced to the the cecum, identified                             by appendiceal orifice and ileocecal valve. The                            colonoscopy was technically difficult and complex                            due to significant looping. Successful completion                            of the procedure was aided by applying abdominal                            pressure. Scope In: 12:05:33 PM Scope Out: 12:47:23 PM Scope Withdrawal Time: 0 hours 27 minutes 14 seconds  Total Procedure Duration: 0 hours 41 minutes 50 seconds  Findings:      The perianal and digital rectal examinations were normal.      A 12 mm polyp was found in the proximal ascending colon. The polyp was       carpet-like. The polyp was removed with a hot snare. Resection and       retrieval were complete. To prevent bleeding after the polypectomy, one       hemostatic clip was successfully placed. There was no bleeding at the       end of the procedure.      A 8 mm polyp was found in the proximal ascending colon. The polyp was       sessile. The polyp was removed with a cold snare. Resection and       retrieval were complete.      Two sessile polyps were found in the ascending colon. The polyps were 2       mm in size. These polyps were removed with a cold biopsy forceps.       Resection and retrieval were complete.      Multiple small and large-mouthed diverticula were found in the sigmoid       colon, descending colon and ascending colon.      Non-bleeding internal hemorrhoids were found during retroflexion. The       hemorrhoids were small. Impression:               -  One 12 mm polyp in the proximal ascending colon,                            removed with a hot snare. Resected and retrieved.                            Clip was placed.                           - One 8 mm polyp in the proximal ascending colon,                            removed with a cold snare. Resected and retrieved.                           - Two 2 mm polyps in the ascending colon,  removed                            with a cold biopsy forceps. Resected and retrieved.                           - Diverticulosis in the sigmoid colon, in the                            descending colon and in the ascending colon.                           - Non-bleeding internal hemorrhoids. Moderate Sedation:      Per Anesthesia Care Recommendation:           - Discharge patient to home (ambulatory).                           - Resume previous diet.                           - Await pathology results.                           - Repeat colonoscopy in 3 years for surveillance.                           - Restart Plavix in 2 days. Procedure Code(s):        --- Professional ---                           [phone], Colonoscopy, flexible; with removal of                            tumor(s), polyp(s), or other lesion(s) by snare                            technique  00349, 22, Colonoscopy, flexible; with biopsy,                            single or multiple Diagnosis Code(s):        --- Professional ---                           K63.5, Polyp of colon                           Z86.010, Personal history of colonic polyps                           K64.8, Other hemorrhoids                           K57.30, Diverticulosis of large intestine without                            perforation or abscess without bleeding CPT copyright 2019 American Medical Association. All rights reserved. The codes documented in this report are preliminary and upon coder review may  be revised to meet current compliance requirements. [name], MD [name],  01/20/2021 12:53:05 PM This report has been signed electronically. Number of Addenda: 0

## 2021-01-20 NOTE — Discharge Instructions (Signed)
You are being discharged to home.  Resume your previous diet.  We are waiting for your pathology results.  Your physician has recommended a repeat colonoscopy in three years for surveillance.  Restart Plavix in 2 days.

## 2021-01-20 NOTE — Anesthesia Preprocedure Evaluation (Signed)
Anesthesia Evaluation  Patient identified by MRN, date of birth, ID band Patient awake    Reviewed: Allergy & Precautions, H&P , NPO status , Patient's Chart, lab work & pertinent test results, reviewed documented beta blocker date and time   Airway Mallampati: II  TM Distance: >3 FB Neck ROM: full    Dental no notable dental hx.    Pulmonary sleep apnea , former smoker,    Pulmonary exam normal breath sounds clear to auscultation       Cardiovascular Exercise Tolerance: Good hypertension, + CAD and + CABG   Rhythm:regular Rate:Normal     Neuro/Psych negative neurological ROS  negative psych ROS   GI/Hepatic Neg liver ROS, GERD  Medicated,  Endo/Other  Hypothyroidism   Renal/GU negative Renal ROS  negative genitourinary   Musculoskeletal   Abdominal   Peds  Hematology  (+) Blood dyscrasia, anemia ,   Anesthesia Other Findings   Reproductive/Obstetrics negative OB ROS                             Anesthesia Physical Anesthesia Plan  ASA: 3  Anesthesia Plan: General   Post-op Pain Management:    Induction:   PONV Risk Score and Plan: Propofol infusion  Airway Management Planned:   Additional Equipment:   Intra-op Plan:   Post-operative Plan:   Informed Consent: I have reviewed the patients History and Physical, chart, labs and discussed the procedure including the risks, benefits and alternatives for the proposed anesthesia with the patient or authorized representative who has indicated his/her understanding and acceptance.     Dental Advisory Given  Plan Discussed with: CRNA  Anesthesia Plan Comments:         Anesthesia Quick Evaluation

## 2021-01-20 NOTE — Transfer of Care (Signed)
Immediate Anesthesia Transfer of Care Note  Patient: [name]  Procedure(s) Performed: COLONOSCOPY WITH PROPOFOL ESOPHAGOGASTRODUODENOSCOPY (EGD) WITH PROPOFOL BIOPSY POLYPECTOMY  Patient Location: PACU  Anesthesia Type:General  Level of Consciousness: awake, alert , oriented and patient cooperative  Airway & Oxygen Therapy: Patient Spontanous Breathing  Post-op Assessment: Report given to RN, Post -op Vital signs reviewed and stable and Patient moving all extremities X 4  Post vital signs: Reviewed and stable  Last Vitals:  Vitals Value Taken Time  BP 118/47 01/20/21 1252  Temp 36.5 C 01/20/21 1252  Pulse 69 01/20/21 1252  Resp 14 01/20/21 1252  SpO2 94 % 01/20/21 1252    Last Pain:  Vitals:   01/20/21 1252  TempSrc: Oral  PainSc:          Complications: No notable events documented.

## 2021-01-21 NOTE — Anesthesia Postprocedure Evaluation (Signed)
Anesthesia Post Note  Patient: [name] [facility]  Procedure(s) Performed: COLONOSCOPY WITH PROPOFOL ESOPHAGOGASTRODUODENOSCOPY (EGD) WITH PROPOFOL BIOPSY POLYPECTOMY  Patient location during evaluation: Phase II Anesthesia Type: General Level of consciousness: awake Pain management: pain level controlled Vital Signs Assessment: post-procedure vital signs reviewed and stable Respiratory status: spontaneous breathing and respiratory function stable Cardiovascular status: blood pressure returned to baseline and stable Postop Assessment: no headache and no apparent nausea or vomiting Anesthetic complications: no Comments: Late entry   No notable events documented.   Last Vitals:  Vitals:   01/20/21 1027 01/20/21 1252  BP: (!) 154/68 (!) 118/47  Pulse: 82 69  Resp: 18 14  Temp: 37.1 C 36.5 C  SpO2: 98% 94%    Last Pain:  Vitals:   01/20/21 1257  TempSrc:   PainSc: 0-No pain                 Louann Sjogren

## 2021-01-23 LAB — SURGICAL PATHOLOGY

## 2021-01-26 ENCOUNTER — Other Ambulatory Visit (INDEPENDENT_AMBULATORY_CARE_PROVIDER_SITE_OTHER): Payer: Self-pay | Admitting: Gastroenterology

## 2021-01-26 ENCOUNTER — Encounter (HOSPITAL_COMMUNITY): Payer: Self-pay | Admitting: Gastroenterology

## 2021-01-26 MED ORDER — FERROUS SULFATE 325 (65 FE) MG PO TABS: 325.0000 mg | ORAL_TABLET | Freq: Two times a day (BID) | ORAL | 3 refills | Status: DC

## 2021-01-26 NOTE — Progress Notes (Signed)
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

## 2021-01-28 DIAGNOSIS — J019 Acute sinusitis, unspecified: Secondary | ICD-10-CM | POA: Diagnosis not present

## 2021-01-29 ENCOUNTER — Encounter (INDEPENDENT_AMBULATORY_CARE_PROVIDER_SITE_OTHER): Payer: Self-pay | Admitting: *Deleted

## 2021-02-06 ENCOUNTER — Telehealth: Payer: Self-pay | Admitting: Cardiovascular Disease

## 2021-02-06 MED ORDER — CARVEDILOL 3.125 MG PO TABS: 3.1250 mg | ORAL_TABLET | Freq: Two times a day (BID) | ORAL | 3 refills | Status: DC

## 2021-02-06 NOTE — Telephone Encounter (Signed)
Patient states he needs a new script written out for him of the carvedilol (COREG) 3.125 MG tablet, he said he takes 3 tablets a day to keep his BP under control.

## 2021-02-06 NOTE — Telephone Encounter (Signed)
Reports home blood pressure readings are around 150/72 when taking carvedilol 3.125 mg (taking 3 tablets) at bedtime. Says he was not aware that he should be taking carvedilol 3.125 mg twice daily Medications reviewed Advised carvedilol 3.125 mg should be taken twice daily Reports BP monitor hasn't been checked for accuracy Reports checking BP before medications are taken each morning and as soon as he sit down Advised to start taking taking coreg 3.125 mg twice daily Check BP with consistency-same machine at the same time of day to check your readings and record them Advised to call office in 2 weeks with his home BP readings Refill sent to [facility] for carvedilol and advised he may have to pay for few pills out of pocket since he has been taking too many. Advised to call office back for other options if too expensive like using $4 list at [facility] or [facility] Verbalized understanding of plan

## 2021-02-09 NOTE — Telephone Encounter (Signed)
Appears BP remains elevated. Would go ahead and increase carvedilol to 6.25 mg BID. thanks

## 2021-02-09 NOTE — Telephone Encounter (Signed)
I spoke with the pt and he reports that he has been using his daughters cuff and his BP has been improved 117/72, 134/78... he thinks his cuff was not working well and he was not sitting prior to checking it... he will hold off on any ned changes for now and he will keep track the next several days and let us know how he is doing.

## 2021-02-12 DIAGNOSIS — Z8551 Personal history of malignant neoplasm of bladder: Secondary | ICD-10-CM | POA: Diagnosis not present

## 2021-02-12 DIAGNOSIS — N3289 Other specified disorders of bladder: Secondary | ICD-10-CM | POA: Diagnosis not present

## 2021-02-12 DIAGNOSIS — C672 Malignant neoplasm of lateral wall of bladder: Secondary | ICD-10-CM | POA: Diagnosis not present

## 2021-03-03 DIAGNOSIS — E785 Hyperlipidemia, unspecified: Secondary | ICD-10-CM | POA: Diagnosis not present

## 2021-03-03 DIAGNOSIS — Z7902 Long term (current) use of antithrombotics/antiplatelets: Secondary | ICD-10-CM | POA: Diagnosis not present

## 2021-03-03 DIAGNOSIS — Z7982 Long term (current) use of aspirin: Secondary | ICD-10-CM | POA: Diagnosis not present

## 2021-03-03 DIAGNOSIS — Z951 Presence of aortocoronary bypass graft: Secondary | ICD-10-CM | POA: Diagnosis not present

## 2021-03-03 DIAGNOSIS — K219 Gastro-esophageal reflux disease without esophagitis: Secondary | ICD-10-CM | POA: Diagnosis not present

## 2021-03-03 DIAGNOSIS — I2510 Atherosclerotic heart disease of native coronary artery without angina pectoris: Secondary | ICD-10-CM | POA: Diagnosis not present

## 2021-03-03 DIAGNOSIS — C672 Malignant neoplasm of lateral wall of bladder: Secondary | ICD-10-CM | POA: Diagnosis not present

## 2021-03-03 DIAGNOSIS — R011 Cardiac murmur, unspecified: Secondary | ICD-10-CM | POA: Diagnosis not present

## 2021-03-03 DIAGNOSIS — Z79899 Other long term (current) drug therapy: Secondary | ICD-10-CM | POA: Diagnosis not present

## 2021-03-03 DIAGNOSIS — G4733 Obstructive sleep apnea (adult) (pediatric): Secondary | ICD-10-CM | POA: Diagnosis not present

## 2021-03-03 DIAGNOSIS — Z9989 Dependence on other enabling machines and devices: Secondary | ICD-10-CM | POA: Diagnosis not present

## 2021-03-03 DIAGNOSIS — Z87891 Personal history of nicotine dependence: Secondary | ICD-10-CM | POA: Diagnosis not present

## 2021-03-03 DIAGNOSIS — D649 Anemia, unspecified: Secondary | ICD-10-CM | POA: Diagnosis not present

## 2021-03-03 DIAGNOSIS — G629 Polyneuropathy, unspecified: Secondary | ICD-10-CM | POA: Diagnosis not present

## 2021-03-03 DIAGNOSIS — E039 Hypothyroidism, unspecified: Secondary | ICD-10-CM | POA: Diagnosis not present

## 2021-03-03 DIAGNOSIS — M1990 Unspecified osteoarthritis, unspecified site: Secondary | ICD-10-CM | POA: Diagnosis not present

## 2021-03-03 DIAGNOSIS — I10 Essential (primary) hypertension: Secondary | ICD-10-CM | POA: Diagnosis not present

## 2021-03-04 ENCOUNTER — Encounter (INDEPENDENT_AMBULATORY_CARE_PROVIDER_SITE_OTHER): Payer: Self-pay | Admitting: *Deleted

## 2021-03-04 DIAGNOSIS — I4519 Other right bundle-branch block: Secondary | ICD-10-CM | POA: Diagnosis not present

## 2021-03-04 DIAGNOSIS — Z01810 Encounter for preprocedural cardiovascular examination: Secondary | ICD-10-CM | POA: Diagnosis not present

## 2021-03-10 DIAGNOSIS — C679 Malignant neoplasm of bladder, unspecified: Secondary | ICD-10-CM | POA: Diagnosis not present

## 2021-03-10 DIAGNOSIS — R011 Cardiac murmur, unspecified: Secondary | ICD-10-CM | POA: Diagnosis not present

## 2021-03-10 DIAGNOSIS — E039 Hypothyroidism, unspecified: Secondary | ICD-10-CM | POA: Diagnosis not present

## 2021-03-10 DIAGNOSIS — Z9989 Dependence on other enabling machines and devices: Secondary | ICD-10-CM | POA: Diagnosis not present

## 2021-03-10 DIAGNOSIS — Z7902 Long term (current) use of antithrombotics/antiplatelets: Secondary | ICD-10-CM | POA: Diagnosis not present

## 2021-03-10 DIAGNOSIS — I2510 Atherosclerotic heart disease of native coronary artery without angina pectoris: Secondary | ICD-10-CM | POA: Diagnosis not present

## 2021-03-10 DIAGNOSIS — G4733 Obstructive sleep apnea (adult) (pediatric): Secondary | ICD-10-CM | POA: Diagnosis not present

## 2021-03-10 DIAGNOSIS — M1990 Unspecified osteoarthritis, unspecified site: Secondary | ICD-10-CM | POA: Diagnosis not present

## 2021-03-10 DIAGNOSIS — K219 Gastro-esophageal reflux disease without esophagitis: Secondary | ICD-10-CM | POA: Diagnosis not present

## 2021-03-10 DIAGNOSIS — I10 Essential (primary) hypertension: Secondary | ICD-10-CM | POA: Diagnosis not present

## 2021-03-10 DIAGNOSIS — E785 Hyperlipidemia, unspecified: Secondary | ICD-10-CM | POA: Diagnosis not present

## 2021-03-10 DIAGNOSIS — Z951 Presence of aortocoronary bypass graft: Secondary | ICD-10-CM | POA: Diagnosis not present

## 2021-03-10 DIAGNOSIS — C672 Malignant neoplasm of lateral wall of bladder: Secondary | ICD-10-CM | POA: Diagnosis not present

## 2021-03-10 DIAGNOSIS — Z79899 Other long term (current) drug therapy: Secondary | ICD-10-CM | POA: Diagnosis not present

## 2021-03-10 DIAGNOSIS — Z7982 Long term (current) use of aspirin: Secondary | ICD-10-CM | POA: Diagnosis not present

## 2021-03-10 DIAGNOSIS — Z87891 Personal history of nicotine dependence: Secondary | ICD-10-CM | POA: Diagnosis not present

## 2021-03-10 DIAGNOSIS — D649 Anemia, unspecified: Secondary | ICD-10-CM | POA: Diagnosis not present

## 2021-03-10 DIAGNOSIS — G629 Polyneuropathy, unspecified: Secondary | ICD-10-CM | POA: Diagnosis not present

## 2021-03-11 DIAGNOSIS — G629 Polyneuropathy, unspecified: Secondary | ICD-10-CM | POA: Diagnosis not present

## 2021-03-11 DIAGNOSIS — G4733 Obstructive sleep apnea (adult) (pediatric): Secondary | ICD-10-CM | POA: Diagnosis not present

## 2021-03-11 DIAGNOSIS — Z7982 Long term (current) use of aspirin: Secondary | ICD-10-CM | POA: Diagnosis not present

## 2021-03-11 DIAGNOSIS — Z79899 Other long term (current) drug therapy: Secondary | ICD-10-CM | POA: Diagnosis not present

## 2021-03-11 DIAGNOSIS — Z951 Presence of aortocoronary bypass graft: Secondary | ICD-10-CM | POA: Diagnosis not present

## 2021-03-11 DIAGNOSIS — Z87891 Personal history of nicotine dependence: Secondary | ICD-10-CM | POA: Diagnosis not present

## 2021-03-11 DIAGNOSIS — I10 Essential (primary) hypertension: Secondary | ICD-10-CM | POA: Diagnosis not present

## 2021-03-11 DIAGNOSIS — K219 Gastro-esophageal reflux disease without esophagitis: Secondary | ICD-10-CM | POA: Diagnosis not present

## 2021-03-11 DIAGNOSIS — E039 Hypothyroidism, unspecified: Secondary | ICD-10-CM | POA: Diagnosis not present

## 2021-03-11 DIAGNOSIS — R011 Cardiac murmur, unspecified: Secondary | ICD-10-CM | POA: Diagnosis not present

## 2021-03-11 DIAGNOSIS — Z7902 Long term (current) use of antithrombotics/antiplatelets: Secondary | ICD-10-CM | POA: Diagnosis not present

## 2021-03-11 DIAGNOSIS — E785 Hyperlipidemia, unspecified: Secondary | ICD-10-CM | POA: Diagnosis not present

## 2021-03-11 DIAGNOSIS — C672 Malignant neoplasm of lateral wall of bladder: Secondary | ICD-10-CM | POA: Diagnosis not present

## 2021-03-11 DIAGNOSIS — Z9989 Dependence on other enabling machines and devices: Secondary | ICD-10-CM | POA: Diagnosis not present

## 2021-03-11 DIAGNOSIS — M1990 Unspecified osteoarthritis, unspecified site: Secondary | ICD-10-CM | POA: Diagnosis not present

## 2021-03-11 DIAGNOSIS — I2510 Atherosclerotic heart disease of native coronary artery without angina pectoris: Secondary | ICD-10-CM | POA: Diagnosis not present

## 2021-03-11 DIAGNOSIS — D649 Anemia, unspecified: Secondary | ICD-10-CM | POA: Diagnosis not present

## 2021-04-06 DIAGNOSIS — C672 Malignant neoplasm of lateral wall of bladder: Secondary | ICD-10-CM | POA: Diagnosis not present

## 2021-04-24 DIAGNOSIS — C672 Malignant neoplasm of lateral wall of bladder: Secondary | ICD-10-CM | POA: Diagnosis not present

## 2021-04-24 DIAGNOSIS — N3289 Other specified disorders of bladder: Secondary | ICD-10-CM | POA: Diagnosis not present

## 2021-04-29 DIAGNOSIS — R310 Gross hematuria: Secondary | ICD-10-CM | POA: Diagnosis not present

## 2021-04-29 DIAGNOSIS — C672 Malignant neoplasm of lateral wall of bladder: Secondary | ICD-10-CM | POA: Diagnosis not present

## 2021-05-06 DIAGNOSIS — C672 Malignant neoplasm of lateral wall of bladder: Secondary | ICD-10-CM | POA: Diagnosis not present

## 2021-05-13 DIAGNOSIS — C672 Malignant neoplasm of lateral wall of bladder: Secondary | ICD-10-CM | POA: Diagnosis not present

## 2021-05-20 DIAGNOSIS — C672 Malignant neoplasm of lateral wall of bladder: Secondary | ICD-10-CM | POA: Diagnosis not present

## 2021-05-27 DIAGNOSIS — C672 Malignant neoplasm of lateral wall of bladder: Secondary | ICD-10-CM | POA: Diagnosis not present

## 2021-06-03 DIAGNOSIS — C672 Malignant neoplasm of lateral wall of bladder: Secondary | ICD-10-CM | POA: Diagnosis not present

## 2021-06-19 DIAGNOSIS — G894 Chronic pain syndrome: Secondary | ICD-10-CM | POA: Diagnosis not present

## 2021-07-06 DIAGNOSIS — R800 Isolated proteinuria: Secondary | ICD-10-CM | POA: Diagnosis not present

## 2021-07-06 DIAGNOSIS — C672 Malignant neoplasm of lateral wall of bladder: Secondary | ICD-10-CM | POA: Diagnosis not present

## 2021-07-06 DIAGNOSIS — Z8551 Personal history of malignant neoplasm of bladder: Secondary | ICD-10-CM | POA: Diagnosis not present

## 2021-07-06 DIAGNOSIS — R82998 Other abnormal findings in urine: Secondary | ICD-10-CM | POA: Diagnosis not present

## 2021-07-06 DIAGNOSIS — C679 Malignant neoplasm of bladder, unspecified: Secondary | ICD-10-CM | POA: Diagnosis not present

## 2021-07-06 DIAGNOSIS — R310 Gross hematuria: Secondary | ICD-10-CM | POA: Diagnosis not present

## 2021-08-05 DIAGNOSIS — C672 Malignant neoplasm of lateral wall of bladder: Secondary | ICD-10-CM | POA: Diagnosis not present

## 2021-08-14 DIAGNOSIS — R197 Diarrhea, unspecified: Secondary | ICD-10-CM | POA: Diagnosis not present

## 2021-08-31 DIAGNOSIS — X32XXXD Exposure to sunlight, subsequent encounter: Secondary | ICD-10-CM | POA: Diagnosis not present

## 2021-08-31 DIAGNOSIS — L814 Other melanin hyperpigmentation: Secondary | ICD-10-CM | POA: Diagnosis not present

## 2021-08-31 DIAGNOSIS — D225 Melanocytic nevi of trunk: Secondary | ICD-10-CM | POA: Diagnosis not present

## 2021-08-31 DIAGNOSIS — L821 Other seborrheic keratosis: Secondary | ICD-10-CM | POA: Diagnosis not present

## 2021-08-31 DIAGNOSIS — Z1283 Encounter for screening for malignant neoplasm of skin: Secondary | ICD-10-CM | POA: Diagnosis not present

## 2021-08-31 DIAGNOSIS — L820 Inflamed seborrheic keratosis: Secondary | ICD-10-CM | POA: Diagnosis not present

## 2021-08-31 DIAGNOSIS — L570 Actinic keratosis: Secondary | ICD-10-CM | POA: Diagnosis not present

## 2021-09-16 ENCOUNTER — Telehealth: Payer: Self-pay | Admitting: *Deleted

## 2021-09-16 ENCOUNTER — Encounter: Payer: Self-pay | Admitting: *Deleted

## 2021-09-16 NOTE — Patient Outreach (Signed)
  Care Coordination   Initial Visit Note   09/16/2021 Name: GARRISON MICHIE MRN: [id number] DOB: [birth year]  JAGGER DEMONTE is a 76 y.o. year old male who sees Sharilyn Sites, MD for primary care. I spoke with  [name] by phone today.  What matters to the patients health and wellness today?  Dysphagia and vertigo    Goals Addressed             This Visit's Progress    Care Coordination Services       Care Coordination Interventions: Reviewed medications with patient and discussed affordability Assessed social determinant of health barriers Assessed mobility and ability to perform ADLs Assessed family/Social support Provided patient/caregiver with verbal information on [place] [phone]) Follow-up telephone appointment scheduled with Valente David, RN Care Coordinator [phone]) for 10/13/21 at 10:00 Falls assessment performed and fall prevention education provided Discussed potential for orthostatic hypotension due to medications Discussed vertigo and use of meclizine 12.'5mg'$  daily which provides relief Discussed dysphagia x 2-3 weeks. Hx of Barrett's esophagus. Last saw GI in 12/2020. Collaborated with Dr Olevia Perches office regarding new onset of dysphagia that presents mostly as difficulty swallowing pills but also has some trouble with food. Encouraged patient to take small bites and to chew his food very well and to drink plenty of water with his medications         SDOH assessments and interventions completed:  Yes  SDOH Interventions Today    Flowsheet Row Most Recent Value  SDOH Interventions   Housing Interventions Intervention Not Indicated  Transportation Interventions Intervention Not Indicated  Utilities Interventions Intervention Not Indicated  Financial Strain Interventions Intervention Not Indicated        Care Coordination Interventions Activated:  Yes  Care Coordination Interventions:  Yes, provided   Follow  up plan: Follow up call scheduled for 10/13/21 at 10:00 with Valente David, RN Care Coordinator [phone])   Encounter Outcome:  Pt. Visit Completed   [name], BSN, RN-BC [place] / [place] Direct Dial: [phone]

## 2021-09-17 ENCOUNTER — Telehealth: Payer: Self-pay | Admitting: *Deleted

## 2021-09-17 ENCOUNTER — Other Ambulatory Visit: Payer: Self-pay | Admitting: Cardiovascular Disease

## 2021-09-17 ENCOUNTER — Encounter: Payer: Self-pay | Admitting: *Deleted

## 2021-09-17 NOTE — Patient Outreach (Signed)
  Care Coordination   Multidisciplinary Case Review Note    09/17/2021 Name: [name] MRN: [id number] DOB: 12/11/1945  [name] is a 76 y.o. year old male who sees Sharilyn Sites, MD for primary care.  The  multidisciplinary care team met today to review patient care needs and barriers.    Goals Addressed             This Visit's Progress    Care Coordination Services       Care Coordination Interventions: Reviewed medications with patient and discussed affordability Assessed social determinant of health barriers Collaborated with Dr Loralie Champagne regarding need for GI follow-up. GI nurse will schedule appointment for patient. Dr Loralie Champagne recommended EGD prior to appointment if patient is willing. Talked with patient and he would like to proceed with EGD. Dr Evonnie Pat updated. Advised patient that he should be contacted to schedule the procedure and the follow-up within the next week or so  Multidisciplinary Care Team review with [place] for [facility] POD 2 Follow-up telephone appointment scheduled with Valente David, RN Care Coordinator [phone]) for 10/13/21 at 10:00        SDOH assessments and interventions completed:  No    Care Coordination Interventions Activated:  Yes   Care Coordination Interventions:  Yes, provided   Follow up plan: Follow up call scheduled for 10/13/21 with Valente David, RN Care Coordinator [phone])  Multidisciplinary Team Attendees:   [name], RN Care Coordinator Valente David, RN Care Coordinator  Scribe for Multidisciplinary Case Review:  Lyn Records, BSN, RN-BC [place] / [place] Direct Dial: [phone]

## 2021-09-22 ENCOUNTER — Other Ambulatory Visit (INDEPENDENT_AMBULATORY_CARE_PROVIDER_SITE_OTHER): Payer: Self-pay

## 2021-09-22 ENCOUNTER — Telehealth (INDEPENDENT_AMBULATORY_CARE_PROVIDER_SITE_OTHER): Payer: Self-pay

## 2021-09-22 ENCOUNTER — Ambulatory Visit (INDEPENDENT_AMBULATORY_CARE_PROVIDER_SITE_OTHER): Payer: Medicare Other | Admitting: Gastroenterology

## 2021-09-22 ENCOUNTER — Encounter (INDEPENDENT_AMBULATORY_CARE_PROVIDER_SITE_OTHER): Payer: Self-pay

## 2021-09-22 ENCOUNTER — Telehealth: Payer: Self-pay | Admitting: *Deleted

## 2021-09-22 ENCOUNTER — Encounter (INDEPENDENT_AMBULATORY_CARE_PROVIDER_SITE_OTHER): Payer: Self-pay | Admitting: Gastroenterology

## 2021-09-22 VITALS — BP 123/65 | HR 59 | Temp 97.5°F | Ht 73.0 in | Wt 214.2 lb

## 2021-09-22 DIAGNOSIS — K317 Polyp of stomach and duodenum: Secondary | ICD-10-CM

## 2021-09-22 DIAGNOSIS — R1310 Dysphagia, unspecified: Secondary | ICD-10-CM

## 2021-09-22 NOTE — Progress Notes (Signed)
Referring Provider: Sharilyn Sites, MD Primary Care Physician:  Sharilyn Sites, MD Primary GI Physician: Jenetta Downer   Chief Complaint  Patient presents with   Dysphagia    Having trouble swallowing. Notices it happens more in the evenings and when he takes meds and when drinking water.    HPI:   LUSTER HECHLER is a 76 y.o. male with past medical history of Barrett's esophagus, GERD, hyperlipidemia, hypertension, hypothyroidism, coronary artery disease status post CABG, prostate cancer, bladder cancer  Patient presenting today for dysphagia.  History: Last seen 01/01/21, at that time, reported dark stools for the past few weeks, had some dizziness and hematuria.  Patient scheduled for EGD and Colonoscopy, advised to follow up with urology.   Last labs hgb 11.8 03/03/21, Iron 96, TIBC 453, saturation 21%, ferritin 9-1/10/23  Present: Patient states that he has some trouble with his swallowing. He reports this has been ongoing for the past 2-3 months. Notes issues with small pills getting stuck in his throat. He does not have issues with food as he chews thoroughly. Has occasionally has to cough the pills back up. Symptoms usually occur at night when he takes his night time pills. He states that he tries to eat something with his pills to see if this will help but it really has not. He has spent at least 25 minutes at one given time trying to get the pill to pass. He thinks he is having issues with pills getting stuck 2-3x/week. Denies any odynophagia. He denies GERD symptoms.  States appetite feels less than it used to be. No nausea, vomiting, abdominal pain, rectal bleeding or melena.   Last EGD:01/20/21 Normal esophagus- Z-line irregular, 41 cm from the incisors. - Multiple gastric polyps. Resected and retrieved-4 gastric adenomas - Erosive gastropathy with no stigmata of recent bleeding. Biopsied-presence of chronic gastritis with atrophy and focal intestinal metaplasia with micronodular  ECL hyperplasia suggestive of atrophic autoimmune gastritis - Normal examined duodenum. Biopsied. Last Colonoscopy 01/20/21- One 12 mm polyp in the proximal ascending colon, removed with a hot snare. Resected and retrieved. Clip was placed. - One 8 mm polyp in the proximal ascending colon - Two 2 mm polyps in the ascending colon - Diverticulosis in the sigmoid colon, in the descending colon and in the ascending colon. - Non-bleeding internal hemorrhoids. (2 tubular adenomas, 1 sessile serrated lesion)  Repeat colonoscopy in 3 years, repeat EGD in 2 months for gastric mapping and resection of other adenomas  Past Medical History:  Diagnosis Date   Anginal pain ([place])    Arthritis    "all over"   Bladder tumor    Chronic lower back pain    Coronary artery disease    a. s/p CABG x 2 (LIMA->LAD, RIMA->RCA);  b. s/p multiple PCI's to Ramus;  c. 08/2011 Cath/PCI: LM 70% into ramus with 70-80% there->treated wtih 3.5x18 Xience Xpedition DES, LCX  nonobs, RCA occluded.  RIMA & LIMA patent, EF 55-65%   DJD (degenerative joint disease)    GERD (gastroesophageal reflux disease)    History of gout    Hyperlipidemia    Hypertension    Hypothyroidism    Neuropathy    OSA on CPAP    Prostate cancer [facility])     Past Surgical History:  Procedure Laterality Date   ABDOMINAL HERNIA REPAIR     BIOPSY  11/26/2015   Procedure: BIOPSY;  Surgeon: Rogene Houston, MD;  [facility];  Service: Endoscopy;;  gastric esophagus  BIOPSY  01/20/2021   Procedure: BIOPSY;  Surgeon: Harvel Quale, MD;  [facility];  Service: Gastroenterology;;   BLADDER TUMOR EXCISION     BROW LIFT Bilateral 04/27/2019   Procedure: BILATERAL BLEPHAROPLASTY;  Surgeon: Baruch Goldmann, MD;  Location: [facility];  Service: Ophthalmology;  Laterality: Bilateral;   CARDIAC CATHETERIZATION N/A 04/10/2015   Procedure: Left Heart Cath and Cors/Grafts Angiography;  Surgeon: Sherren Mocha, MD;  Location: [place] CV LAB;  Service: Cardiovascular;  Laterality: N/A;   CARDIAC CATHETERIZATION N/A 04/10/2015   Procedure: Coronary Stent Intervention;  Surgeon: Sherren Mocha, MD;  Location: [place] CV LAB;  Service: Cardiovascular;  Laterality: N/A;   CATARACT EXTRACTION W/PHACO Right 06/23/2015   Procedure: CATARACT EXTRACTION PHACO AND INTRAOCULAR LENS PLACEMENT RIGHT EYE; CDE:  7.08;  Surgeon: Tonny Branch, MD;  [facility]: [facility];  Service: Ophthalmology;  Laterality: Right;   CATARACT EXTRACTION W/PHACO Left 08/25/2015   Procedure: CATARACT EXTRACTION PHACO AND INTRAOCULAR LENS PLACEMENT LEFT EYE; CDE:  8.18;  Surgeon: Tonny Branch, MD;  [facility]: [facility];  Service: Ophthalmology;  Laterality: Left;   COLONOSCOPY N/A 12/28/2012   Procedure: COLONOSCOPY;  Surgeon: Rogene Houston, MD;  [facility];  Service: Endoscopy;  Laterality: N/A;  830 rescheduled   COLONOSCOPY WITH PROPOFOL N/A 01/20/2021   Procedure: COLONOSCOPY WITH PROPOFOL;  Surgeon: Harvel Quale, MD;  [facility];  Service: Gastroenterology;  Laterality: N/A;  11:05   CORONARY ANGIOPLASTY WITH STENT PLACEMENT  2013; 04/10/2015   "this makes me a total of 7" (04/10/2015)   CORONARY ARTERY BYPASS GRAFT  1997   with (LIMA)   ESOPHAGEAL DILATION N/A 11/26/2015   Procedure: ESOPHAGEAL DILATION;  Surgeon: Rogene Houston, MD;  [facility];  Service: Endoscopy;  Laterality: N/A;   ESOPHAGEAL DILATION N/A 05/23/2017   Procedure: ESOPHAGEAL DILATION;  Surgeon: Rogene Houston, MD;  [facility];  Service: Endoscopy;  Laterality: N/A;   ESOPHAGEAL DILATION N/A 06/16/2017   Procedure: ESOPHAGEAL DILATION;  Surgeon: Rogene Houston, MD;  [facility];  Service: Endoscopy;  Laterality: N/A;   ESOPHAGOGASTRODUODENOSCOPY N/A 11/26/2015   Procedure: ESOPHAGOGASTRODUODENOSCOPY (EGD);  Surgeon: Rogene Houston, MD;  [facility];  Service: Endoscopy;  Laterality: N/A;  1:25    ESOPHAGOGASTRODUODENOSCOPY N/A 05/23/2017   Procedure: ESOPHAGOGASTRODUODENOSCOPY (EGD);  Surgeon: Rogene Houston, MD;  [facility];  Service: Endoscopy;  Laterality: N/A;  1:15   ESOPHAGOGASTRODUODENOSCOPY N/A 06/16/2017   Procedure: ESOPHAGOGASTRODUODENOSCOPY (EGD);  Surgeon: Rogene Houston, MD;  [facility];  Service: Endoscopy;  Laterality: N/A;  1240   ESOPHAGOGASTRODUODENOSCOPY (EGD) WITH PROPOFOL N/A 01/20/2021   Procedure: ESOPHAGOGASTRODUODENOSCOPY (EGD) WITH PROPOFOL;  Surgeon: Harvel Quale, MD;  [facility];  Service: Gastroenterology;  Laterality: N/A;   HERNIA REPAIR Left    KNEE CARTILAGE SURGERY Bilateral    LEFT HEART CATH AND CORS/GRAFTS ANGIOGRAPHY N/A 09/08/2016   Procedure: LEFT HEART CATH AND CORS/GRAFTS ANGIOGRAPHY;  Surgeon: Martinique, Peter M, MD;  Location: [place] CV LAB;  Service: Cardiovascular;  Laterality: N/A;   LEFT HEART CATHETERIZATION WITH CORONARY ANGIOGRAM N/A 09/08/2011   Procedure: LEFT HEART CATHETERIZATION WITH CORONARY ANGIOGRAM;  Surgeon: Sherren Mocha, MD;  Location: [facility] CATH LAB;  Service: Cardiovascular;  Laterality: N/A;   PERCUTANEOUS CORONARY STENT INTERVENTION (PCI-S) Right 09/08/2011   Procedure: PERCUTANEOUS CORONARY STENT INTERVENTION (PCI-S);  Surgeon: Sherren Mocha, MD;  Location[facility] CATH LAB;  Service: Cardiovascular;  Laterality: Right;   POLYPECTOMY  01/20/2021   Procedure: POLYPECTOMY;  Surgeon: Montez Morita, Quillian Quince, MD;  [facility];  Service: Gastroenterology;;   ROBOT ASSISTED LAPAROSCOPIC RADICAL PROSTATECTOMY      Current Outpatient Medications  Medication Sig Dispense Refill   amLODipine (NORVASC) 10 MG tablet Take 10 mg by mouth daily.     aspirin 81 MG tablet Take 1 tablet (81 mg total) by mouth at bedtime. 30 tablet    atorvastatin (LIPITOR) 40 MG tablet TAKE 1 TABLET BY MOUTH EVERY DAY. (Patient taking differently: Take 20 mg by mouth daily.) 90 tablet 3    carvedilol (COREG) 3.125 MG tablet Take 1 tablet (3.125 mg total) by mouth 2 (two) times daily. 180 tablet 3   celecoxib (CELEBREX) 200 MG capsule Take 1 capsule (200 mg total) by mouth daily.     cetirizine (ZYRTEC) 10 MG tablet Take 10 mg by mouth daily.     clopidogrel (PLAVIX) 75 MG tablet Take 1 tablet (75 mg total) by mouth in the morning. 90 tablet 3   cyanocobalamin (,VITAMIN B-12,) 1000 MCG/ML injection Inject 1,000 mcg into the muscle every 30 (thirty) days.  2   ferrous sulfate (FERROUSUL) 325 (65 FE) MG tablet Take 1 tablet (325 mg total) by mouth 2 (two) times daily with a meal. 180 tablet 3   Flaxseed, Linseed, (FLAXSEED OIL PO) Take by mouth. One teaspoon every 3 days     gabapentin (NEURONTIN) 600 MG tablet Take 600 mg by mouth 3 (three) times daily.  3   glucosamine-chondroitin 500-400 MG tablet Take 1 tablet by mouth daily.     isosorbide mononitrate (IMDUR) 60 MG 24 hr tablet Take 1 tablet (60 mg total) by mouth daily. Please call [phone] to schedule an appointment for future refills thank you. 1st attempt. 30 tablet 0   levothyroxine (SYNTHROID, LEVOTHROID) 150 MCG tablet Take 150 mcg by mouth daily before breakfast.     meclizine (ANTIVERT) 25 MG tablet Take 1 tablet (25 mg total) by mouth 2 (two) times daily as needed for dizziness. 30 tablet 0   nitroGLYCERIN (NITROSTAT) 0.4 MG SL tablet Place 1 tablet (0.4 mg total) under the tongue every 5 (five) minutes as needed for chest pain. 25 tablet 3   Omega-3 Fatty Acids (FISH OIL) 1200 MG CAPS Take 1,200 mg by mouth daily.      Polyvinyl Alcohol-Povidone (REFRESH OP) Place 1 drop into both eyes daily as needed (dry eyes).     Potassium 99 MG TABS Take 198 mg by mouth at bedtime.     psyllium (METAMUCIL) 58.6 % powder Take 1 packet by mouth. Takes about every other day     traMADol (ULTRAM) 50 MG tablet Take 50 mg by mouth 3 (three) times daily.  1   No current facility-administered medications for this visit.    Allergies  as of 09/22/2021 - Review Complete 09/22/2021  Allergen Reaction Noted   Codeine Nausea Only 09/28/2008   Pollen extract Other (See Comments) 04/09/2015    Family History  Problem Relation Age of Onset   Cancer Father 24       died   Stroke Mother 58       died    Social History   Socioeconomic History   Marital status: Widowed    Spouse name: Not on file   Number of children: Not on file   Years of education: Not on file   Highest education level: Not on  file  Occupational History   Occupation: semi-retired  Tobacco Use   Smoking status: Former    Types: Cigars    Passive exposure: Past   Smokeless tobacco: Never   Tobacco comments:    "quit smoking in ~ 1978"  Vaping Use   Vaping Use: Never used  Substance and Sexual Activity   Alcohol use: Yes    Comment: occasionally    Drug use: No   Sexual activity: Not Currently  Other Topics Concern   Not on file  Social History Narrative   Not on file   Social Determinants of Health   Financial Resource Strain: Low Risk  (09/16/2021)   Overall Financial Resource Strain (CARDIA)    Difficulty of Paying Living Expenses: Not hard at all  Food Insecurity: Not on file  Transportation Needs: No Transportation Needs (09/16/2021)   PRAPARE - Hydrologist (Medical): No    Lack of Transportation (Non-Medical): No  Physical Activity: Not on file  Stress: Not on file  Social Connections: Not on file    Review of systems General: negative for malaise, night sweats, fever, chills, weight loss Neck: Negative for lumps, goiter, pain and significant neck swelling Resp: Negative for cough, wheezing, dyspnea at rest CV: Negative for chest pain, leg swelling, palpitations, orthopnea GI: denies melena, hematochezia, nausea, vomiting, diarrhea, constipation, odyonophagia, early satiety or unintentional weight loss. +pill dysphagia  MSK: Negative for joint pain or swelling, back pain, and muscle pain. Derm:  Negative for itching or rash Psych: Denies depression, anxiety, memory loss, confusion. No homicidal or suicidal ideation.  Heme: Negative for prolonged bleeding, bruising easily, and swollen nodes. Endocrine: Negative for cold or heat intolerance, polyuria, polydipsia and goiter. Neuro: negative for tremor, gait imbalance, syncope and seizures. The remainder of the review of systems is noncontributory.  Physical Exam: BP 123/65 (BP Location: Left Arm, Patient Position: Sitting, Cuff Size: Large)   Pulse (!) 59   Temp (!) 97.5 F (36.4 C) (Oral)   Ht '6\' 1"'$  (1.854 m)   Wt 214 lb 3.2 oz (97.2 kg)   BMI 28.26 kg/m  General:   Alert and oriented. No distress noted. Pleasant and cooperative.  Head:  Normocephalic and atraumatic. Eyes:  Conjuctiva clear without scleral icterus. Mouth:  Oral mucosa pink and moist. Good dentition. No lesions. Heart: Normal rate and rhythm, s1 and s2 heart sounds present.  Lungs: Clear lung sounds in all lobes. Respirations equal and unlabored. Abdomen:  +BS, soft, non-tender and non-distended. No rebound or guarding. No HSM or masses noted. Derm: No palmar erythema or jaundice Msk:  Symmetrical without gross deformities. Normal posture. Extremities:  Without edema. Neurologic:  Alert and  oriented x4 Psych:  Alert and cooperative. Normal mood and affect.  Invalid input(s): "6 MONTHS"   ASSESSMENT: MURVIN GIFT is a 76 y.o. male presenting today for pills dysphagia.  Reports of pill dysphagia for the past 2-3 months, usually only with night time pills, smaller ones tend to cause worse issues. No issues with swallowing foods. He has no odynophagia, nausea, vomiting, abdominal pain, or GERD symptoms. Dysphagia with pills occurs 2-3x/week. Previous barium pill study in 2019 was normal. Recent EGD in January without any esophageal ring, web, stricture or stenosis. Notably he was recommended to have repeat EGD in march 2023 for gastric mapping for concerns  of atrophic AI gastritis and further resection of gastric polyps, however, he was lost to follow up for this. Recommend proceeding with repeat  EGD, will evaluate for etiology of dysphagia at that time as well, however, may need to consider barium pill study if no causes of dysphagia are found on endoscopy. Indications, risks and benefits of procedure discussed in detail with patient. Patient verbalized understanding and is in agreement to proceed with EGD +/- dilation at this time.   PLAN:  Repeat EGD +/- dilation, with gastric mapping/resection of gastric polyps-ASA II 2. Consider barium pill study if EGD unremarkable  3. Continue to chew thoroughly  All questions were answered, patient verbalized understanding and is in agreement with plan as outlined above.    Follow Up: 3 months   Juventino Pavone L. Alver Sorrow, MSN, APRN, AGNP-C Adult-Gerontology Nurse Practitioner [facility]

## 2021-09-22 NOTE — Telephone Encounter (Signed)
Thanks

## 2021-09-22 NOTE — Telephone Encounter (Signed)
   Pre-operative Risk Assessment    Patient Name: [name]  DOB: 25-May-1945 MRN: [id number]      Request for Surgical Clearance    Procedure:   UPPER ENDOSCOPY  Date of Surgery:  Clearance 10/28/21                                 Surgeon:  DR. DANIEL CASTANEDA Surgeon's Group or Practice Name:  [facility] Phone number:  [phone] Fax number:  [phone]   Type of Clearance Requested:   - Medical  - Pharmacy:  Hold Clopidogrel (Plavix) x 5 DAYS PRIOR   Type of Anesthesia:  MAC   Additional requests/questions:    [name]   09/22/2021, 1:59 PM

## 2021-09-22 NOTE — Patient Instructions (Signed)
We will get you scheduled for upper endoscopy per Dr. Colman Cater recommendations based on previous upper endoscopy findings in January, if no findings to explain your swallowing issues on EGD, we will consider doing barium swallow study to further evaluate these symptoms. Please continue to chew thoroughly, take pills with lots of water.  Follow up 3 months

## 2021-09-22 NOTE — Telephone Encounter (Signed)
Egd is scheduled on 10/28/21 and he is aware with instructions

## 2021-09-22 NOTE — Telephone Encounter (Signed)
----- Message from Harvel Quale, MD sent at 09/17/2021  5:05 PM EDT ----- Regarding: RE: Dysphagia Thanks, will try to get him soon if possible (I'll be out of town from 9/16 to 10/4)  Darius Bump, can you please schedule an EGD in next available? Dx: dysphagia. Room: 3  Mitzie, can you please schedule a follow up appointment in next available?  Thanks,  [name], MD Gastroenterology and Hepatology [facility]  ----- Message ----- From: Ilean China, RN Sent: 09/17/2021   4:45 PM EDT To: Harvel Quale, MD Subject: RE: Dysphagia                                  I'm sorry it's taken me all day to get back to you. I spoke with Mr Weygandt and he is willing to go ahead and schedule the EGD. The dysphagia he's experiencing now is as bad as it was prior to the last EGD. I advised that someone from your office will give him a call to schedule that and the follow-up appointment with you. I will be out of the office tomorrow but can follow-up with him next week if there's anything else I can help with.   Thank you, Cyril Mourning ----- Message ----- From: Harvel Quale, MD Sent: 09/16/2021   2:18 PM EDT To: Ilean China, RN Subject: RE: Dysphagia                                  Thanks, please keep me posted ----- Message ----- From: Ilean China, RN Sent: 09/16/2021   2:18 PM EDT To: Harvel Quale, MD Subject: RE: Dysphagia                                  Thank you for your help. I plan to talk with him again tomorrow and I will ask about the EGD and follow-up with you afterwards.   Thank you! ----- Message ----- From: Harvel Quale, MD Sent: 09/16/2021   2:07 PM EDT To: Ilean China, RN; Patrecia Pour Subject: RE: Dysphagia                                  Thanks Cyril Mourning, we will reach him to schedule a follow up. As we have long wait times to see patients, if he is interested we can  set him up for an EGD prior to his visit. Please let me know if he is interested.   Mitzie, please schedule him for a follow up appointment in next available  Thanks ----- Message ----- From: Ilean China, RN Sent: 09/16/2021   1:54 PM EDT To: Harvel Quale, MD Subject: Dysphagia                                      Hi Dr Montez Morita,  I spoke with Mr [facility] today and he complains of dysphagia x 2-3 weeks. Mainly presents as difficulty with swallowing pills. He has to drink up to 16oz to feel like it's going down. He has some difficulty with food but  is careful to take small bites and chew it very well. He has as hx of GERD and Barrett's esophagus and last saw you in 12/2020. Would you mind forwarding this message to someone that can schedule him an office visit for an evaluation?  Please let me know if I can be of any assistance.  Thank you,  [name], BSN, RN-BC [place] [facility] Pharmacist, community Dial: [phone]

## 2021-09-24 DIAGNOSIS — G4733 Obstructive sleep apnea (adult) (pediatric): Secondary | ICD-10-CM | POA: Diagnosis not present

## 2021-09-24 DIAGNOSIS — E119 Type 2 diabetes mellitus without complications: Secondary | ICD-10-CM | POA: Diagnosis not present

## 2021-09-24 DIAGNOSIS — G629 Polyneuropathy, unspecified: Secondary | ICD-10-CM | POA: Diagnosis not present

## 2021-09-24 DIAGNOSIS — Z23 Encounter for immunization: Secondary | ICD-10-CM | POA: Diagnosis not present

## 2021-09-24 DIAGNOSIS — I25810 Atherosclerosis of coronary artery bypass graft(s) without angina pectoris: Secondary | ICD-10-CM | POA: Diagnosis not present

## 2021-09-24 DIAGNOSIS — R7309 Other abnormal glucose: Secondary | ICD-10-CM | POA: Diagnosis not present

## 2021-09-24 DIAGNOSIS — Z0001 Encounter for general adult medical examination with abnormal findings: Secondary | ICD-10-CM | POA: Diagnosis not present

## 2021-09-24 DIAGNOSIS — E039 Hypothyroidism, unspecified: Secondary | ICD-10-CM | POA: Diagnosis not present

## 2021-09-24 NOTE — Telephone Encounter (Signed)
   Name: ELISON WORREL  DOB: 1945/10/15  MRN: [id number]  Primary Cardiologist: Sherren Mocha, MD  Chart reviewed as part of pre-operative protocol coverage. Because of Henderson Frampton Arwood's past medical history and time since last visit, he will require a follow-up in-office visit in order to better assess preoperative cardiovascular risk.  Last OV will be >1 year ago. Due for f/u.  Pre-op covering staff: - Please schedule appointment and call patient to inform them. If patient already had an upcoming appointment within acceptable timeframe, please add "pre-op clearance" to the appointment notes so provider is aware. - Please contact requesting surgeon's office via preferred method (i.e, phone, fax) to inform them of need for appointment prior to surgery.  Re: Plavix, if appropriate can use prior Plavix clearance in older phone clearances at [facility].  Charlie Pitter, PA-C  09/24/2021, 12:48 PM

## 2021-09-24 NOTE — Telephone Encounter (Signed)
Attempted to reach the patient but no answer. Left voicemail on mobile and daughters phone number requesting a call back for appointment needed for cardiac clearance.

## 2021-09-25 NOTE — Telephone Encounter (Signed)
Pt's vm is full, could not leave message. I did leave a message for pt's daughter [name] x 2  to call back and schedule IN OFFICE appt for pre op clearance and f/u appt.

## 2021-09-29 NOTE — Telephone Encounter (Signed)
I tried to call the pt again but vm still full. I then called and s/w pt's daughter [name]. [name] did say the pt states when he called back he was transferred to other dept. I explained that he does not need to be transferred to another dept. Stated to have pt call [phone], the operator/scheduling team will answer. He just then needs to tell the scheduler that he needs an appt in office with Dr. Burt Knack or APP for pre op clearance.

## 2021-09-30 DIAGNOSIS — C672 Malignant neoplasm of lateral wall of bladder: Secondary | ICD-10-CM | POA: Diagnosis not present

## 2021-09-30 NOTE — Telephone Encounter (Signed)
Pt has appt 10/06/21 with Richardson Dopp, PAC.Marland Kitchen

## 2021-10-05 NOTE — Progress Notes (Unsigned)
Cardiology Office Note:    Date:  10/06/2021   ID:  Abhi, Moccia [birth year], MRN [id number]  PCP:  Sharilyn Sites, [place] Providers Cardiologist:  Sherren Mocha, MD     Referring MD: Sharilyn Sites, MD   CC: Here for Preoperative Cardiovascular Risk Assessment  History of Present Illness:    ABDULRAHEEM PINEO is a 76 y.o. male with a hx of the following:  HTN CAD, s/p CABG (LIMA to LAD and RIMA to RCA) in 97, subsequent PCIs HLD Aortic stenosis OSA on CPAP Barrett's esophagus Hypothyroidism Chronic back pain Dizziness   Underwent CABG x2 and 97 (LIMA to LAD and RIMA to RCA).  Underwent PCI after abnormal nuclear scan in 2017, treated with overlapping drug-eluting stent in the distal RCA through the graft to the RCA.  Most recent heart catheterization in 2018 showed patent grafts and stents to the distal RCA.  He was last seen in the cardiology office 1 year ago, evaluated by Leanor Kail, PA-C.  Was doing very well from a cardiac perspective, denied any chief cardiac complaints, was walking 1 to 2 miles few times per day without any issues.  Had a history of urinary retention was status post transcatheter resection of the bladder, reported difficulty starting urine.  SBP averaging around 140s, said sometimes it went to 160-180s.  Carvedilol 3.125 mg twice daily added to medication regimen for hypertension.   Most recently, his GI doctor contacted our office for request for preoperative cardiovascular risk assessment.  Upper endoscopy has been scheduled for October 28, 2021.  Surgeon will be Dr. [name].  Type of anesthesia will be MAC.  Requesting to hold Plavix for 5 days prior.  He also presents today for overdue 1 year follow-up.  States he is doing better from last office visit.  CC today recent fatigue within the last week, daily occurrence, states he thinks it may be related more to his vertigo, also admits to DOE along with this.   Recent BP on wrist cuff 111/66, arm cuff BP averages around 681E systolic.  Says it is hard for him to tell if his dizziness is related to vertigo, but he notices this every day when standing up, but thinks it is vertigo.  Recently started wearing his wife's hearing aids and has helped his symptoms a lot, he has not had any tinnitus since.  Has had chronic CP with exercise for years.  Says he notices this when walking up a steep hill on his walking route, denies any chest pain with minimal or moderate exertion.  When I asked him to describe this chest pain he said "it feels like I am going to have a heart attack."  Located in the middle of the chest, does not radiate, has remained the same over many years.  8 or 9 out of the 0-10 pain scale when it occurs, took nitroglycerin several months ago for this pain and it did not help.  Has not taken any nitroglycerin since, he knows that if he rests after exercising, his chest pain it will go away.  Denies any palpitations, syncope, presyncope, orthopnea, PND, swelling, significant weight changes, bleeding, or claudication.  Denies any other questions or concerns today.  Past Medical History:  Diagnosis Date   Anginal pain ([place])    Arthritis    "all over"   Bladder tumor    Chronic lower back pain    Coronary artery disease    a. s/p  CABG x 2 (LIMA->LAD, RIMA->RCA);  b. s/p multiple PCI's to Ramus;  c. 08/2011 Cath/PCI: LM 70% into ramus with 70-80% there->treated wtih 3.5x18 Xience Xpedition DES, LCX  nonobs, RCA occluded.  RIMA & LIMA patent, EF 55-65%   DJD (degenerative joint disease)    GERD (gastroesophageal reflux disease)    History of gout    Hyperlipidemia    Hypertension    Hypothyroidism    Neuropathy    OSA on CPAP    Prostate cancer [facility])     Past Surgical History:  Procedure Laterality Date   ABDOMINAL HERNIA REPAIR     BIOPSY  11/26/2015   Procedure: BIOPSY;  Surgeon: Rogene Houston, MD;  [facility];  Service:  Endoscopy;;  gastric esophagus   BIOPSY  01/20/2021   Procedure: BIOPSY;  Surgeon: Harvel Quale, MD;  [facility];  Service: Gastroenterology;;   BLADDER TUMOR EXCISION     BROW LIFT Bilateral 04/27/2019   Procedure: BILATERAL BLEPHAROPLASTY;  Surgeon: Baruch Goldmann, MD;  Location: [facility];  Service: Ophthalmology;  Laterality: Bilateral;   CARDIAC CATHETERIZATION N/A 04/10/2015   Procedure: Left Heart Cath and Cors/Grafts Angiography;  Surgeon: Sherren Mocha, MD;  Location: [place] CV LAB;  Service: Cardiovascular;  Laterality: N/A;   CARDIAC CATHETERIZATION N/A 04/10/2015   Procedure: Coronary Stent Intervention;  Surgeon: Sherren Mocha, MD;  Location: [place] [facility] LAB;  Service: Cardiovascular;  Laterality: N/A;   CATARACT EXTRACTION W/PHACO Right 06/23/2015   Procedure: CATARACT EXTRACTION PHACO AND INTRAOCULAR LENS PLACEMENT RIGHT EYE; CDE:  7.08;  Surgeon: Tonny Branch, MD;  Location: [facility];  Service: Ophthalmology;  Laterality: Right;   CATARACT EXTRACTION W/PHACO Left 08/25/2015   Procedure: CATARACT EXTRACTION PHACO AND INTRAOCULAR LENS PLACEMENT LEFT EYE; CDE:  8.18;  Surgeon: Tonny Branch, MD;  Location: [facility];  Service: Ophthalmology;  Laterality: Left;   COLONOSCOPY N/A 12/28/2012   Procedure: COLONOSCOPY;  Surgeon: Rogene Houston, MD;  [facility];  Service: Endoscopy;  Laterality: N/A;  830 rescheduled   COLONOSCOPY WITH PROPOFOL N/A 01/20/2021   Procedure: COLONOSCOPY WITH PROPOFOL;  Surgeon: Harvel Quale, MD;  [facility];  Service: Gastroenterology;  Laterality: N/A;  11:05   CORONARY ANGIOPLASTY WITH STENT PLACEMENT  2013; 04/10/2015   "this makes me a total of 7" (04/10/2015)   CORONARY ARTERY BYPASS GRAFT  1997   with (LIMA)   ESOPHAGEAL DILATION N/A 11/26/2015   Procedure: ESOPHAGEAL DILATION;  Surgeon: Rogene Houston, MD;  [facility];  Service: Endoscopy;  Laterality: N/A;   ESOPHAGEAL DILATION  N/A 05/23/2017   Procedure: ESOPHAGEAL DILATION;  Surgeon: Rogene Houston, MD;  [facility];  Service: Endoscopy;  Laterality: N/A;   ESOPHAGEAL DILATION N/A 06/16/2017   Procedure: ESOPHAGEAL DILATION;  Surgeon: Rogene Houston, MD;  [facility];  Service: Endoscopy;  Laterality: N/A;   ESOPHAGOGASTRODUODENOSCOPY N/A 11/26/2015   Procedure: ESOPHAGOGASTRODUODENOSCOPY (EGD);  Surgeon: Rogene Houston, MD;  [facility];  Service: Endoscopy;  Laterality: N/A;  1:25   ESOPHAGOGASTRODUODENOSCOPY N/A 05/23/2017   Procedure: ESOPHAGOGASTRODUODENOSCOPY (EGD);  Surgeon: Rogene Houston, MD;  [facility];  Service: Endoscopy;  Laterality: N/A;  1:15   ESOPHAGOGASTRODUODENOSCOPY N/A 06/16/2017   Procedure: ESOPHAGOGASTRODUODENOSCOPY (EGD);  Surgeon: Rogene Houston, MD;  [facility];  Service: Endoscopy;  Laterality: N/A;  1240   ESOPHAGOGASTRODUODENOSCOPY (EGD) WITH PROPOFOL N/A 01/20/2021   Procedure: ESOPHAGOGASTRODUODENOSCOPY (  EGD) WITH PROPOFOL;  Surgeon: Montez Morita, Quillian Quince, MD;  [facility];  Service: Gastroenterology;  Laterality: N/A;   HERNIA REPAIR Left    KNEE CARTILAGE SURGERY Bilateral    LEFT HEART CATH AND CORS/GRAFTS ANGIOGRAPHY N/A 09/08/2016   Procedure: LEFT HEART CATH AND CORS/GRAFTS ANGIOGRAPHY;  Surgeon: Martinique, Peter M, MD;  Location: [place] [facility] LAB;  Service: Cardiovascular;  Laterality: N/A;   LEFT HEART CATHETERIZATION WITH CORONARY ANGIOGRAM N/A 09/08/2011   Procedure: LEFT HEART CATHETERIZATION WITH CORONARY ANGIOGRAM;  Surgeon: Sherren Mocha, MD;  Location: [facility] CATH LAB;  Service: Cardiovascular;  Laterality: N/A;   PERCUTANEOUS CORONARY STENT INTERVENTION (PCI-S) Right 09/08/2011   Procedure: PERCUTANEOUS CORONARY STENT INTERVENTION (PCI-S);  Surgeon: Sherren Mocha, MD;  Location: [facility] CATH LAB;  Service: Cardiovascular;  Laterality: Right;   POLYPECTOMY  01/20/2021   Procedure: POLYPECTOMY;  Surgeon:  Montez Morita, Quillian Quince, MD;  [facility];  Service: Gastroenterology;;   ROBOT ASSISTED LAPAROSCOPIC RADICAL PROSTATECTOMY      Current Medications: Current Meds  Medication Sig   amLODipine (NORVASC) 10 MG tablet Take 10 mg by mouth daily.   aspirin 81 MG tablet Take 1 tablet (81 mg total) by mouth at bedtime.   atorvastatin (LIPITOR) 40 MG tablet Take 1 tablet (40 mg total) by mouth daily.   Black Currant Seed Oil 500 MG CAPS Take by mouth.   carvedilol (COREG) 3.125 MG tablet Take 1 tablet (3.125 mg total) by mouth 2 (two) times daily.   celecoxib (CELEBREX) 200 MG capsule Take 1 capsule (200 mg total) by mouth daily.   cetirizine (ZYRTEC) 10 MG tablet Take 10 mg by mouth daily.   clopidogrel (PLAVIX) 75 MG tablet Take 1 tablet (75 mg total) by mouth in the morning.   cyanocobalamin (,VITAMIN B-12,) 1000 MCG/ML injection Inject 1,000 mcg into the muscle every 30 (thirty) days.   ferrous sulfate (FERROUSUL) 325 (65 FE) MG tablet Take 1 tablet (325 mg total) by mouth 2 (two) times daily with a meal.   Flaxseed, Linseed, (FLAXSEED OIL PO) Take by mouth. One teaspoon every 3 days   gabapentin (NEURONTIN) 600 MG tablet Take 600 mg by mouth 3 (three) times daily.   glucosamine-chondroitin 500-400 MG tablet Take 1 tablet by mouth daily.   isosorbide mononitrate (IMDUR) 60 MG 24 hr tablet Take 1.5 tablets (90 mg total) by mouth daily.   levothyroxine (SYNTHROID, LEVOTHROID) 150 MCG tablet Take 150 mcg by mouth daily before breakfast.   meclizine (ANTIVERT) 25 MG tablet Take 1 tablet (25 mg total) by mouth 2 (two) times daily as needed for dizziness.   nitroGLYCERIN (NITROSTAT) 0.4 MG SL tablet Place 1 tablet (0.4 mg total) under the tongue every 5 (five) minutes as needed for chest pain.   Omega-3 Fatty Acids (FISH OIL) 1200 MG CAPS Take 1,200 mg by mouth daily.    Polyvinyl Alcohol-Povidone (REFRESH OP) Place 1 drop into both eyes daily as needed (dry eyes).   Potassium 99 MG  TABS Take 198 mg by mouth at bedtime.   psyllium (METAMUCIL) 58.6 % powder Take 1 packet by mouth. Takes about every other day   traMADol (ULTRAM) 50 MG tablet Take 50 mg by mouth 3 (three) times daily.   isosorbide mononitrate (IMDUR) 60 MG 24 hr tablet Take 1 tablet (60 mg total) by mouth daily. Please call [phone] to schedule an appointment for future refills thank you. 1st attempt.     Allergies:   Codeine and Pollen extract  Social History   Socioeconomic History   Marital status: Widowed    Spouse name: Not on file   Number of children: Not on file   Years of education: Not on file   Highest education level: Not on file  Occupational History   Occupation: semi-retired  Tobacco Use   Smoking status: Former    Types: Cigars    Passive exposure: Past   Smokeless tobacco: Never   Tobacco comments:    "quit smoking in ~ 1978"  Vaping Use   Vaping Use: Never used  Substance and Sexual Activity   Alcohol use: Yes    Comment: occasionally    Drug use: No   Sexual activity: Not Currently  Other Topics Concern   Not on file  Social History Narrative   Not on file   Social Determinants of Health   Financial Resource Strain: Low Risk  (09/16/2021)   Overall Financial Resource Strain (CARDIA)    Difficulty of Paying Living Expenses: Not hard at all  Food Insecurity: Not on file  Transportation Needs: No Transportation Needs (09/16/2021)   PRAPARE - Hydrologist (Medical): No    Lack of Transportation (Non-Medical): No  Physical Activity: Not on file  Stress: Not on file  Social Connections: Not on file     Family History: The patient's family history includes Cancer (age of onset: 71) in his father; Stroke (age of onset: 46) in his mother.  ROS:   Review of Systems  Constitutional:  Positive for malaise/fatigue. Negative for chills, diaphoresis, fever and weight loss.  HENT:  Positive for hearing loss. Negative for congestion, ear  discharge, ear pain, nosebleeds, sinus pain, sore throat and tinnitus.        See HPI.  Eyes: Negative.   Respiratory:  Positive for shortness of breath. Negative for cough, hemoptysis, sputum production, wheezing and stridor.        See HPI.   Cardiovascular:  Positive for chest pain. Negative for palpitations, orthopnea, claudication, leg swelling and PND.       See HPI.  Gastrointestinal: Negative.   Genitourinary: Negative.   Musculoskeletal: Negative.   Skin: Negative.   Neurological:  Positive for dizziness. Negative for tingling, tremors, sensory change, speech change, focal weakness, seizures, loss of consciousness, weakness and headaches.       See HPI.   Endo/Heme/Allergies: Negative.   Psychiatric/Behavioral: Negative.     Please see the history of present illness.    All other systems reviewed and are negative.  EKGs/Labs/Other Studies Reviewed:    The following studies were reviewed today:   EKG:  EKG is ordered today.  The ekg ordered today demonstrates NSR, 65 bpm, incomplete RBBB, ST segment changes, otherwise nothing acute.   Left heart cath on September 08, 2016: Mid RCA lesion, 100 %stenosed. RIMA. Dist RCA lesion, 0 %stenosed. A drug eluting . Ost RPDA lesion, 0 %stenosed. A drug eluting . Prox LAD lesion, 100 %stenosed. LIMA. Ost LM to LM lesion, 25 %stenosed. Ost Ramus to Ramus lesion, 25 %stenosed. The left ventricular systolic function is normal. LV end diastolic pressure is mildly elevated. The left ventricular ejection fraction is 55-65% by visual estimate.   1. 2 vessel occlusive CAD 2. Patent stents in the left main and large ramus intermediate branch 3. Patent stents in the distal RCA 4. Patent free RIMA to the distal RCA 5. Patent LIMA to the LAD 6. Normal LV function 7. Mildly elevated LVEDP.  Left  heart cath on April 10, 2015: Mid RCA lesion, 100% stenosed. RIMA . Patent graft - free RIMA Prox LAD lesion, 100% stenosed. LIMA  . LIMA-LAD patent Ost LM to LM lesion, 25% stenosed. The lesion was previously treated with a stent (unknown type). Ost Ramus to Ramus lesion, 25% stenosed. The lesion was previously treated with a stent (unknown type). Ost RPDA lesion, 100% stenosed. Post intervention, there is a 0% residual stenosis. Dist RCA lesion, 99% stenosed. Post intervention, there is a 0% residual stenosis.   1. Severe native vessel CAD with total occlusion of the RCA, total occlusion of the LAD, and patency of the stented segment of the left main into the ramus.  2. Severe stenosis of the distal RCA treated successfully with PCI (drug-eluting stent) 3. Total occlusion of the PDA following distal RCA stenting, likely due to plaque shifting, treated successfully with PCI (DES), but with distal vessel occlusion possibly due to distal vessel dissection (pt asymptomatic)   Continue DAPT. Will transition to [place] tomorrow am. Pt would be a candidate for the Twilight Trial if he meets criteria.   Nuclear stress test on April 01, 2015: Nuclear stress EF: 59%. Downsloping ST segment depression ST segment depression of 1 mm was noted during stress in the II, III, V4, V5 and V6 leads, beginning at 6 minutes of stress, ending at 7 minutes of stress. Defect 1: There is a medium defect of moderate severity present in the basal inferior, mid inferior and mid inferolateral location. Findings consistent with ischemia. This is an intermediate risk study. The left ventricular ejection fraction is normal (55-65%).   There is a medium size, mild severity reversible defect in the basal and mid inferior and inferolateral walls consistent with ischemia. There were 1 mm ST depressions in the inferolateral leads that occurs toward the end of exercise and resolve early in the recovery, however recurs 2 minutes in recovery as downsloaping deppressions that persist 7 minutes into recovery (high risk feature).    2D echocardiogram on March 22, 2011: Left ventricle: The cavity size was normal. There was mild    concentric hypertrophy. Systolic function was normal. The    estimated ejection fraction was in the range of 60% to    65%. Wall motion was normal; there were no regional wall    motion abnormalities.  - Aortic valve: Mildly to moderately calcified annulus.    Mildly thickened, mildly calcified leaflets. There was    very mild stenosis. Valve area: 2.72cm^2(VTI). Valve area:    3.49cm^2 (Vmax).  - Right ventricle: The cavity size was normal. Wall    thickness was mildly increased.  - Atrial septum: No defect or patent foramen ovale was    identified.   Recent Labs: No results found for requested labs within last 365 days.  Recent Lipid Panel No results found for: "CHOL", "TRIG", "HDL", "CHOLHDL", "VLDL", "LDLCALC", "LDLDIRECT"   Risk Assessment/Calculations:     RCRI score is 1. Perioperative risk of major cardiac event is 0.9%. Duke Activity Status Index score is 32.95. Functional Capacity in METS is 6.79.   Physical Exam:    VS:  BP 134/70   Pulse 65   Ht 6' (1.829 m)   Wt 216 lb 12.8 oz (98.3 kg)   SpO2 98%   BMI 29.40 kg/m     Wt Readings from Last 3 Encounters:  10/06/21 216 lb 12.8 oz (98.3 kg)  09/22/21 214 lb 3.2 oz (97.2 kg)  01/14/21 225 lb (102.1  kg)    Orthostatic VS today: Lying, 137/72, heart rate 61 (lightheaded for 1 minute) Sitting, 132/70, heart rate 65, no symptoms Standing, 141/69, heart rate 68, no symptoms Standing for several minutes, 142/72, heart rate 66, no symptoms  GEN: Well developed, well nourished 76 y.o. Caucasian male in no acute distress HEENT: Normal NECK: No JVD; No carotid bruits CARDIAC: S1/S2, RRR RESPIRATORY:  Clear to auscultation without rales, wheezing or rhonchi  MUSCULOSKELETAL: No edema, No deformity  SKIN: Warm and dry NEUROLOGIC:  Alert and oriented x 3 PSYCHIATRIC:  Normal affect    ASSESSMENT:    1. Preoperative cardiovascular examination    2. Other fatigue   3. Dizziness   4. Coronary artery disease involving native heart with angina pectoris, unspecified vessel or lesion type ([place])   5. Hyperlipidemia, unspecified hyperlipidemia type   6. Essential hypertension   7. Nonrheumatic aortic valve stenosis   8. OSA on CPAP    PLAN:    In order of problems listed above:  Preoperative cardiovascular risk assessment    Mr. Trombly's perioperative risk of a major cardiac event is 0.9% according to the Revised Cardiac Risk Index (RCRI).  Therefore, he is at low risk for perioperative complications.   His functional capacity is excellent at 6.79 METs according to the Duke Activity Status Index (DASI). Recommendations: The patient requires an echocardiogram before a disposition can be made regarding surgical risk.                Antiplatelet and/or Anticoagulation Recommendations: Clopidogrel (Plavix) can be held for 5 days prior to his surgery and resumed as soon as possible post op. ASA should be continued without interruption in the perioperative period. This patient does not require antibiotic prophylaxis.   Fatigue, Dizziness Etiology multifactorial. Orthostatics negative today in office. No hypotension at home. 2D Echo in 2013 revealed EF 60-65%, mild LVH, mildly to moderately calcified annulus of aortic valve, very mild AS, otherwise normal. Will update 2D echo at this time before patient can proceed to upcoming surgery. Will obtain the following blood work: TSH, CBC, CMET, and Magnesium level. Continue Meclizine PRN and PT exercises for hx of vertigo.   4. CAD, HLD Chronic chest pain mainly noticed with exercise (walking up a steep hill outside) and relieved with rest. Last NST showed intermediate risk and prompted LHC to be done, revealed inferolateral ischemia. Discussed several options for ischemia evaluation, and he is agreeable to a Lexiscan stress test prior to his upcoming GI procedure as outlined below. Risks and  benefits regarding the test were discussed and he verbalized understanding and is agreeable to proceed. Will increase Imdur from 60 mg daily to 90 mg daily since orthostatics were negative in office today. ED precautions discussed. Will obtain the lab work as mentioned above. Continue current medication regimen. Will obtain FLP and CMET since he is due for this to be drawn.   Shared Decision Making/Informed Consent The risks [chest pain, shortness of breath, cardiac arrhythmias, dizziness, blood pressure fluctuations, myocardial infarction, stroke/transient ischemic attack, nausea, vomiting, allergic reaction, radiation exposure, metallic taste sensation and life-threatening complications (estimated to be 1 in 10,000)], benefits (risk stratification, diagnosing coronary artery disease, treatment guidance) and alternatives of a nuclear stress test were discussed in detail with Mr. Cronin and he agrees to proceed.  5. HTN BP today 134/70. SBP at home around 150's, says SBP has been labile and using two different cuffs at home. Discussed to monitor BP using only arm cuff at  home at least 2 hours after medications and sitting for 5-10 minutes. Will increase Imdur as mentioned above. Will obtain the blood work as mentioned above. Continue current medication regimen.   6. Aortic stenosis Very mild AS noted on 2D echo in 2013. Recent fatigue and dizziness, admits to recent DOE with these symptoms. Will update 2D echo at this time.   7. OSA on CPAP Compliant with use. Continue to follow with PCP.   8. Disposition: Follow up with me or Richardson Dopp, PA-C in 2 weeks or sooner if anything changes.    Medication Adjustments/Labs and Tests Ordered: Current medicines are reviewed at length with the patient today.  Concerns regarding medicines are outlined above.  Orders Placed This Encounter  Procedures   CBC   Magnesium   Lipid panel   TSH   Comp Met (CMET)   MYOCARDIAL PERFUSION IMAGING   EKG  12-Lead   ECHOCARDIOGRAM COMPLETE   Meds ordered this encounter  Medications   isosorbide mononitrate (IMDUR) 60 MG 24 hr tablet    Sig: Take 1.5 tablets (90 mg total) by mouth daily.    Dispense:  135 tablet    Refill:  3    Patient Instructions  Medication Instructions:  Your physician has recommended you make the following change in your medication:   INCREASE the Imdur (Isosorbide) to 60 mg taking 1 1/2 tablet daily    *If you need a refill on your cardiac medications before your next appointment, please call your pharmacy*   Lab Work: TODAY:  CMET, CBC, TSH, MAG, & LIPID   If you have labs (blood work) drawn today and your tests are completely normal, you will receive your results only by: [place] (if you have MyChart) OR A paper copy in the mail If you have any lab test that is abnormal or we need to change your treatment, we will call you to review the results.   Testing/Procedures: Your physician has requested that you have an echocardiogram BEFORE 10/19/21.  Echocardiography is a painless test that uses sound waves to create images of your heart. It provides your doctor with information about the size and shape of your heart and how well your heart's chambers and valves are working. This procedure takes approximately one hour. There are no restrictions for this procedure.  Your physician has requested that you have a [place] 10/19/21. For further information please visit [email or web address]. Please follow instruction sheet, BELOW:    You are scheduled for a Myocardial Perfusion Imaging Study Please arrive 15 minutes prior to your appointment time for registration and insurance purposes.  The test will take approximately 3 to 4 hours to complete; you may bring reading material.  If someone comes with you to your appointment, they will need to remain in the main lobby due to limited space in the testing area. **If you are pregnant or breastfeeding,  please notify the nuclear lab prior to your appointment**  How to prepare for your Myocardial Perfusion Test: Do not eat or drink 3 hours prior to your test, except you may have water. Do not consume products containing caffeine (regular or decaffeinated) 12 hours prior to your test. (ex: coffee, chocolate, sodas, tea). Do bring a list of your current medications with you.  If not listed below, you may take your medications as normal. Do wear comfortable clothes (no dresses or overalls) and walking shoes, tennis shoes preferred (No heels or open toe shoes are allowed). Do NOT wear cologne,  perfume, aftershave, or lotions (deodorant is allowed). If these instructions are not followed, your test will have to be rescheduled.      Follow-Up: At [facility], you and your health needs are our priority.  As part of our continuing mission to provide you with exceptional heart care, we have created designated Provider Care Teams.  These Care Teams include your primary Cardiologist (physician) and Advanced Practice Providers (APPs -  Physician Assistants and Nurse Practitioners) who all work together to provide you with the care you need, when you need it.  We recommend signing up for the patient portal called "MyChart".  Sign up information is provided on this After Visit Summary.  MyChart is used to connect with patients for Virtual Visits (Telemedicine).  Patients are able to view lab/test results, encounter notes, upcoming appointments, etc.  Non-urgent messages can be sent to your provider as well.   To learn more about what you can do with MyChart, go to [email or web address].    Your next appointment:   AFTER TESTING   The format for your next appointment:   In Person  Provider:   On Richardson Dopp, PA-C's schedule 10/10 or 10/11 Finis Bud, NP will see pt if you will put that in comments   Other Instructions   Important Information About Sugar          Signed, Finis Bud, NP  10/06/2021 8:37 PM    [place]

## 2021-10-06 ENCOUNTER — Ambulatory Visit: Payer: Medicare Other | Attending: Physician Assistant | Admitting: Nurse Practitioner

## 2021-10-06 ENCOUNTER — Encounter: Payer: Self-pay | Admitting: Physician Assistant

## 2021-10-06 VITALS — BP 134/70 | HR 65 | Ht 72.0 in | Wt 216.8 lb

## 2021-10-06 DIAGNOSIS — I10 Essential (primary) hypertension: Secondary | ICD-10-CM

## 2021-10-06 DIAGNOSIS — R011 Cardiac murmur, unspecified: Secondary | ICD-10-CM | POA: Diagnosis not present

## 2021-10-06 DIAGNOSIS — I350 Nonrheumatic aortic (valve) stenosis: Secondary | ICD-10-CM

## 2021-10-06 DIAGNOSIS — Z01810 Encounter for preprocedural cardiovascular examination: Secondary | ICD-10-CM | POA: Diagnosis not present

## 2021-10-06 DIAGNOSIS — Z9989 Dependence on other enabling machines and devices: Secondary | ICD-10-CM

## 2021-10-06 DIAGNOSIS — E785 Hyperlipidemia, unspecified: Secondary | ICD-10-CM

## 2021-10-06 DIAGNOSIS — R42 Dizziness and giddiness: Secondary | ICD-10-CM | POA: Diagnosis not present

## 2021-10-06 DIAGNOSIS — I25119 Atherosclerotic heart disease of native coronary artery with unspecified angina pectoris: Secondary | ICD-10-CM | POA: Diagnosis not present

## 2021-10-06 DIAGNOSIS — R5383 Other fatigue: Secondary | ICD-10-CM | POA: Diagnosis not present

## 2021-10-06 DIAGNOSIS — I200 Unstable angina: Secondary | ICD-10-CM | POA: Diagnosis not present

## 2021-10-06 DIAGNOSIS — G4733 Obstructive sleep apnea (adult) (pediatric): Secondary | ICD-10-CM | POA: Diagnosis not present

## 2021-10-06 DIAGNOSIS — Z01818 Encounter for other preprocedural examination: Secondary | ICD-10-CM | POA: Diagnosis not present

## 2021-10-06 MED ORDER — ISOSORBIDE MONONITRATE ER 60 MG PO TB24: 90.0000 mg | ORAL_TABLET | Freq: Every day | ORAL | 3 refills | Status: DC

## 2021-10-06 NOTE — Patient Instructions (Addendum)
Medication Instructions:  Your physician has recommended you make the following change in your medication:   INCREASE the Imdur (Isosorbide) to 60 mg taking 1 1/2 tablet daily    *If you need a refill on your cardiac medications before your next appointment, please call your pharmacy*   Lab Work: TODAY:  CMET, CBC, TSH, MAG, & LIPID   If you have labs (blood work) drawn today and your tests are completely normal, you will receive your results only by: [place] (if you have MyChart) OR A paper copy in the mail If you have any lab test that is abnormal or we need to change your treatment, we will call you to review the results.   Testing/Procedures: Your physician has requested that you have an echocardiogram BEFORE 10/19/21.  Echocardiography is a painless test that uses sound waves to create images of your heart. It provides your doctor with information about the size and shape of your heart and how well your heart's chambers and valves are working. This procedure takes approximately one hour. There are no restrictions for this procedure.  Your physician has requested that you have a Frederica 10/19/21. For further information please visit [email or web address]. Please follow instruction sheet, BELOW:    You are scheduled for a Myocardial Perfusion Imaging Study Please arrive 15 minutes prior to your appointment time for registration and insurance purposes.  The test will take approximately 3 to 4 hours to complete; you may bring reading material.  If someone comes with you to your appointment, they will need to remain in the main lobby due to limited space in the testing area. **If you are pregnant or breastfeeding, please notify the nuclear lab prior to your appointment**  How to prepare for your Myocardial Perfusion Test: Do not eat or drink 3 hours prior to your test, except you may have water. Do not consume products containing caffeine (regular or decaffeinated)  12 hours prior to your test. (ex: coffee, chocolate, sodas, tea). Do bring a list of your current medications with you.  If not listed below, you may take your medications as normal. Do wear comfortable clothes (no dresses or overalls) and walking shoes, tennis shoes preferred (No heels or open toe shoes are allowed). Do NOT wear cologne, perfume, aftershave, or lotions (deodorant is allowed). If these instructions are not followed, your test will have to be rescheduled.      Follow-Up: At [facility], you and your health needs are our priority.  As part of our continuing mission to provide you with exceptional heart care, we have created designated Provider Care Teams.  These Care Teams include your primary Cardiologist (physician) and Advanced Practice Providers (APPs -  Physician Assistants and Nurse Practitioners) who all work together to provide you with the care you need, when you need it.  We recommend signing up for the patient portal called "MyChart".  Sign up information is provided on this After Visit Summary.  MyChart is used to connect with patients for Virtual Visits (Telemedicine).  Patients are able to view lab/test results, encounter notes, upcoming appointments, etc.  Non-urgent messages can be sent to your provider as well.   To learn more about what you can do with MyChart, go to [email or web address].    Your next appointment:   AFTER TESTING   The format for your next appointment:   In Person  Provider:   On Richardson Dopp, PA-C's schedule 10/10 or 10/11 Finis Bud, NP will see pt if  you will put that in comments   Other Instructions   Important Information About Sugar

## 2021-10-07 LAB — COMPREHENSIVE METABOLIC PANEL
ALT: 19 IU/L (ref 0–44)
AST: 18 IU/L (ref 0–40)
Albumin/Globulin Ratio: 1.9 (ref 1.2–2.2)
Albumin: 4.5 g/dL (ref 3.8–4.8)
Alkaline Phosphatase: 78 IU/L (ref 44–121)
BUN/Creatinine Ratio: 15 (ref 10–24)
BUN: 19 mg/dL (ref 8–27)
Bilirubin Total: 0.3 mg/dL (ref 0.0–1.2)
CO2: 24 mmol/L (ref 20–29)
Calcium: 9.5 mg/dL (ref 8.6–10.2)
Chloride: 103 mmol/L (ref 96–106)
Creatinine, Ser: 1.3 mg/dL — ABNORMAL HIGH (ref 0.76–1.27)
Globulin, Total: 2.4 g/dL (ref 1.5–4.5)
Glucose: 118 mg/dL — ABNORMAL HIGH (ref 70–99)
Potassium: 4.2 mmol/L (ref 3.5–5.2)
Sodium: 143 mmol/L (ref 134–144)
Total Protein: 6.9 g/dL (ref 6.0–8.5)
eGFR: 56 mL/min/{1.73_m2} — ABNORMAL LOW (ref >59)

## 2021-10-07 LAB — LIPID PANEL
Chol/HDL Ratio: 3.4 ratio (ref 0.0–5.0)
Cholesterol, Total: 121 mg/dL (ref 100–199)
HDL: 36 mg/dL — ABNORMAL LOW (ref >39)
LDL Chol Calc (NIH): 49 mg/dL (ref 0–99)
Triglycerides: 226 mg/dL — ABNORMAL HIGH (ref 0–149)
VLDL Cholesterol Cal: 36 mg/dL (ref 5–40)

## 2021-10-07 LAB — CBC
Hematocrit: 35.1 % — ABNORMAL LOW (ref 37.5–51.0)
Hemoglobin: 12.1 g/dL — ABNORMAL LOW (ref 13.0–17.7)
MCH: 34 pg — ABNORMAL HIGH (ref 26.6–33.0)
MCHC: 34.5 g/dL (ref 31.5–35.7)
MCV: 99 fL — ABNORMAL HIGH (ref 79–97)
Platelets: 176 10*3/uL (ref 150–450)
RBC: 3.56 x10E6/uL — ABNORMAL LOW (ref 4.14–5.80)
RDW: 12.7 % (ref 11.6–15.4)
WBC: 5.7 10*3/uL (ref 3.4–10.8)

## 2021-10-07 LAB — MAGNESIUM: Magnesium: 2.2 mg/dL (ref 1.6–2.3)

## 2021-10-07 LAB — TSH: TSH: 0.375 u[IU]/mL — ABNORMAL LOW (ref 0.450–4.500)

## 2021-10-09 ENCOUNTER — Ambulatory Visit (HOSPITAL_COMMUNITY)
Admission: RE | Admit: 2021-10-09 | Discharge: 2021-10-09 | Disposition: A | Discharge status: Home / Self Care | Payer: Medicare Other | Source: Ambulatory Visit | Attending: Nurse Practitioner | Admitting: Nurse Practitioner

## 2021-10-09 ENCOUNTER — Telehealth: Payer: Self-pay

## 2021-10-09 DIAGNOSIS — I25119 Atherosclerotic heart disease of native coronary artery with unspecified angina pectoris: Secondary | ICD-10-CM | POA: Insufficient documentation

## 2021-10-09 DIAGNOSIS — I10 Essential (primary) hypertension: Secondary | ICD-10-CM | POA: Diagnosis not present

## 2021-10-09 DIAGNOSIS — R7989 Other specified abnormal findings of blood chemistry: Secondary | ICD-10-CM

## 2021-10-09 DIAGNOSIS — I350 Nonrheumatic aortic (valve) stenosis: Secondary | ICD-10-CM | POA: Insufficient documentation

## 2021-10-09 DIAGNOSIS — Z01810 Encounter for preprocedural cardiovascular examination: Secondary | ICD-10-CM | POA: Diagnosis not present

## 2021-10-09 LAB — ECHOCARDIOGRAM COMPLETE
AR max vel: 3.16 cm2
AV Area VTI: 3.47 cm2
AV Area mean vel: 3.02 cm2
AV Mean grad: 13 mmHg
AV Peak grad: 20.3 mmHg
Ao pk vel: 2.25 m/s
Area-P 1/2: 2.62 cm2
S' Lateral: 3.1 cm

## 2021-10-09 MED ORDER — FENOFIBRATE 160 MG PO TABS: 160.0000 mg | ORAL_TABLET | Freq: Every day | ORAL | 11 refills | Status: DC

## 2021-10-09 NOTE — Telephone Encounter (Signed)
-----   Message from Finis Bud, NP sent at 10/08/2021 10:07 AM EDT ----- Please let Mr. Happel know the following regarding his recent blood work results.    His CBC still indicates anemia, but this has improved.  His triglycerides are elevated and good cholesterol is low.  I recommend he avoids fatty foods and processed foods and increase his physical activity as much as possible.  If he would like to start on a medication to help lower the triglyceride level, I will be happy to arrange this for him.  If he is interested, let's initiate fenofibrate 160 mg daily.  His thyroid-stimulating hormone is a hair low and his kidney function is a little increased from 1 year ago.  He will follow-up with Richardson Dopp, PA on October 11, which is in about 2 weeks.  When he comes in at that follow-up appointment, lets arrange a repeat BMET and TSH with reflex T3 and T4.  I want him to drink anywhere between 32 to 48 ounces of water per day and hopefully this should improve his kidney function.  Thanks so much!   Kind Regards, Finis Bud, AGNP-C

## 2021-10-09 NOTE — Telephone Encounter (Signed)
Called and reviewed results and recommendations with patient. He agrees to start Fenofibrate, will send to pharmacy on file. Order placed for BMET and TSHw/reflex to be done on 10/21/21. He states that increasing his fluids is difficult for him, but he will try.

## 2021-10-09 NOTE — Progress Notes (Signed)
*  PRELIMINARY RESULTS* Echocardiogram 2D Echocardiogram has been performed.  [name] 10/09/2021, 3:02 PM

## 2021-10-12 ENCOUNTER — Ambulatory Visit (HOSPITAL_COMMUNITY)
Admission: RE | Admit: 2021-10-12 | Discharge: 2021-10-12 | Disposition: A | Discharge status: Home / Self Care | Payer: Medicare Other | Source: Ambulatory Visit | Attending: Nurse Practitioner | Admitting: Nurse Practitioner

## 2021-10-12 ENCOUNTER — Encounter (HOSPITAL_BASED_OUTPATIENT_CLINIC_OR_DEPARTMENT_OTHER)
Admission: RE | Admit: 2021-10-12 | Discharge: 2021-10-12 | Disposition: A | Discharge status: Home / Self Care | Payer: Medicare Other | Source: Ambulatory Visit | Attending: Nurse Practitioner | Admitting: Nurse Practitioner

## 2021-10-12 DIAGNOSIS — I25119 Atherosclerotic heart disease of native coronary artery with unspecified angina pectoris: Secondary | ICD-10-CM | POA: Insufficient documentation

## 2021-10-12 DIAGNOSIS — Z01810 Encounter for preprocedural cardiovascular examination: Secondary | ICD-10-CM | POA: Diagnosis not present

## 2021-10-12 DIAGNOSIS — I10 Essential (primary) hypertension: Secondary | ICD-10-CM | POA: Diagnosis not present

## 2021-10-12 DIAGNOSIS — I350 Nonrheumatic aortic (valve) stenosis: Secondary | ICD-10-CM | POA: Diagnosis not present

## 2021-10-12 LAB — NM MYOCAR MULTI W/SPECT W/WALL MOTION / EF
LV dias vol: 111 mL (ref 62–150)
LV sys vol: 47 mL
Nuc Stress EF: 57 %
Peak HR: 71 {beats}/min
RATE: 0.3
Rest HR: 52 {beats}/min
Rest Nuclear Isotope Dose: 9 mCi
SDS: 3
SRS: 1
SSS: 4
ST Depression (mm): 0 mm
Stress Nuclear Isotope Dose: 30 mCi
TID: 1.21

## 2021-10-12 MED ORDER — SODIUM CHLORIDE FLUSH 0.9 % IV SOLN
INTRAVENOUS | Status: AC
  Administered 2021-10-12: 10 mL via INTRAVENOUS
  Filled 2021-10-12: qty 10

## 2021-10-12 MED ORDER — TECHNETIUM TC 99M TETROFOSMIN IV KIT
30.0000 | PACK | Freq: Once | INTRAVENOUS | Status: AC | PRN
  Administered 2021-10-12: 30 via INTRAVENOUS

## 2021-10-12 MED ORDER — TECHNETIUM TC 99M TETROFOSMIN IV KIT
10.0000 | PACK | Freq: Once | INTRAVENOUS | Status: AC | PRN
  Administered 2021-10-12: 9.5 via INTRAVENOUS

## 2021-10-12 MED ORDER — REGADENOSON 0.4 MG/5ML IV SOLN
INTRAVENOUS | Status: AC
  Administered 2021-10-12: 0.4 mg via INTRAVENOUS
  Filled 2021-10-12: qty 5

## 2021-10-13 ENCOUNTER — Encounter: Payer: Self-pay | Admitting: *Deleted

## 2021-10-13 ENCOUNTER — Ambulatory Visit: Payer: Self-pay | Admitting: *Deleted

## 2021-10-13 NOTE — Patient Outreach (Signed)
  Care Coordination   Follow Up Visit Note   10/13/2021 Name: [name] MRN: [id number] DOB: 1945-04-12  [name] is a 76 y.o. year old male who sees Sharilyn Sites, MD for primary care. I spoke with  [name] by phone today.  What matters to the patients health and wellness today?  Upcoming EGD for dysphagia and results of recent cardiac tests    Goals Addressed             This Visit's Progress    Care Coordination Services       Care Coordination Interventions: Assessed social determinant of health barriers Previously collaborated with Dr Loralie Champagne regarding dysphagia  Reviewed chart notes.  Results of cardiac studies done yesterday are not available yet.  Advised patient that he should receive a call regarding results or they may be discussed at upcoming cardiac appt on 10/21/21 at 1:55 with Richardson Dopp, PA-C Discussed current symptoms.  Continues to have dysphagia but it is no worse than before.  Reviewed upcoming appointment for EGD on 10/28/21 Multidisciplinary Care Team review with [place] for [facility] POD 2 Assessed additional needs/concerns today. Patient has none. Encouraged to reach out to Care Coordination team as needed Follow-up telephone appointment scheduled with Valente David, RN Care Coordinator [phone]) on 11/04/21 at 10:00         SDOH assessments and interventions completed:  No. Completed at last visit.    Care Coordination Interventions Activated:  Yes  Care Coordination Interventions:  Yes, provided   Follow up plan: Follow up call scheduled for 11/04/21 at 10:00 with Valente David, RN Care Coordinator [phone])   Encounter Outcome:  Pt. Visit Completed   [name], BSN, RN-BC RN Care Coordinator [place]: [phone] Main #: [phone]

## 2021-10-21 ENCOUNTER — Other Ambulatory Visit: Payer: Medicare Other

## 2021-10-21 ENCOUNTER — Ambulatory Visit: Payer: Medicare Other | Attending: Physician Assistant | Admitting: Nurse Practitioner

## 2021-10-21 NOTE — Progress Notes (Deleted)
Cardiology Office Note:    Date:  10/21/2021   ID:  SHERI PROWS, DOB [birth year], MRN [id number]  PCP:  Sharilyn Sites, [facility] Cardiologist:  Sherren Mocha, MD     Referring MD: Sharilyn Sites, MD   CC: Here for Preoperative Cardiovascular Risk Assessment ***  History of Present Illness:    FRANKLYN CAFARO is a 76 y.o. male with a hx of the following:  HTN CAD, s/p CABG (LIMA to LAD and RIMA to RCA) in 97, subsequent PCIs HLD Aortic stenosis OSA on CPAP Barrett's esophagus Hypothyroidism Chronic back pain Dizziness   Underwent CABG x2 and 97 (LIMA to LAD and RIMA to RCA).  Underwent PCI after abnormal nuclear scan in 2017, treated with overlapping drug-eluting stent in the distal RCA through the graft to the RCA.  Most recent heart catheterization in 2018 showed patent grafts and stents to the distal RCA.  He was last seen in the cardiology office 1 year ago, evaluated by Leanor Kail, PA-C.  Was doing very well from a cardiac perspective, denied any chief cardiac complaints, was walking 1 to 2 miles few times per day without any issues.  Had a history of urinary retention was status post transcatheter resection of the bladder, reported difficulty starting urine.  SBP averaging around 140s, said sometimes it went to 160-180s.  Carvedilol 3.125 mg twice daily added to medication regimen for hypertension.   Most recently, his GI doctor contacted our office for request for preoperative cardiovascular risk assessment.  Upper endoscopy has been scheduled for October 28, 2021.  Surgeon will be Dr. [name].  Type of anesthesia will be MAC.  Requesting to hold Plavix for 5 days prior.  I last saw him on 10/06/21 for his regular follow up and preoperative cardiovascular risk assessment.  CC recent fatigue within the last week, daily occurrence, admitted to DOE and dizziness with standing along with this. BP on wrist cuff 111/66, arm cuff BP  409W systolic. Reported chronic central, non-radiating, CP with exercise for years, most notable when walking up a steep hill on his walking route, denied any chest pain with minimal or moderate exertion. 8/9 out of the 0-10 pain when it occurred, NG did not relieve it. Only relieved when stopping exercise. Denied any palpitations, syncope, presyncope, orthopnea, PND, swelling, significant weight changes, bleeding, or claudication. TTE revealed EF 65-70%, no RWMA, moderate asymetric LVH, moderate annular calcification, mild calcification of aortic valve. Mild aortic valve stenosis. NST arranged was intermediate risk.  Findings were consistent with prior MI with peri-infarct ischemia.  No ST deviation was noted, EF normal.  End-diastolic cavity size was normal.  Small, mild fixed defect in the inferolateral wall that was mildly worse with stress. Small inferolateral scar with mild peri-infarct ischemia noted. Dr. Burt Knack consulted and agreed that if angina was not improved by next follow-up, consider a repeat cardiac catheterization for evaluation.   Today he presents for follow-up and states ***  ***  Past Medical History:  Diagnosis Date   Anginal pain (Ramirez-Perez)    Arthritis    "all over"   Bladder tumor    Chronic lower back pain    Coronary artery disease    a. s/p CABG x 2 (LIMA->LAD, RIMA->RCA);  b. s/p multiple PCI's to Ramus;  c. 08/2011 Cath/PCI: LM 70% into ramus with 70-80% there->treated wtih 3.5x18 Xience Xpedition DES, LCX  nonobs, RCA occluded.  RIMA & LIMA patent, EF 55-65%   DJD (  degenerative joint disease)    GERD (gastroesophageal reflux disease)    History of gout    Hyperlipidemia    Hypertension    Hypothyroidism    Neuropathy    OSA on CPAP    Prostate cancer [facility])     Past Surgical History:  Procedure Laterality Date   ABDOMINAL HERNIA REPAIR     BIOPSY  11/26/2015   Procedure: BIOPSY;  Surgeon: Rogene Houston, MD;  [facility];  Service: Endoscopy;;   gastric esophagus   BIOPSY  01/20/2021   Procedure: BIOPSY;  Surgeon: Harvel Quale, MD;  [facility];  Service: Gastroenterology;;   BLADDER TUMOR EXCISION     BROW LIFT Bilateral 04/27/2019   Procedure: BILATERAL BLEPHAROPLASTY;  Surgeon: Baruch Goldmann, MD;  Location: [facility];  Service: Ophthalmology;  Laterality: Bilateral;   CARDIAC CATHETERIZATION N/A 04/10/2015   Procedure: Left Heart Cath and Cors/Grafts Angiography;  Surgeon: Sherren Mocha, MD;  Location: [place] CV [facility];  Service: Cardiovascular;  Laterality: N/A;   CARDIAC CATHETERIZATION N/A 04/10/2015   Procedure: Coronary Stent Intervention;  Surgeon: Sherren Mocha, MD;  Location: [place] [facility];  Service: Cardiovascular;  Laterality: N/A;   CATARACT EXTRACTION W/PHACO Right 06/23/2015   Procedure: CATARACT EXTRACTION PHACO AND INTRAOCULAR LENS PLACEMENT RIGHT EYE; CDE:  7.08;  Surgeon: Tonny Branch, MD;  Location: [facility];  Service: Ophthalmology;  Laterality: Right;   CATARACT EXTRACTION W/PHACO Left 08/25/2015   Procedure: CATARACT EXTRACTION PHACO AND INTRAOCULAR LENS PLACEMENT LEFT EYE; CDE:  8.18;  Surgeon: Tonny Branch, MD;  Location: [facility];  Service: Ophthalmology;  Laterality: Left;   COLONOSCOPY N/A 12/28/2012   Procedure: COLONOSCOPY;  Surgeon: Rogene Houston, MD;  [facility];  Service: Endoscopy;  Laterality: N/A;  830 rescheduled   COLONOSCOPY WITH PROPOFOL N/A 01/20/2021   Procedure: COLONOSCOPY WITH PROPOFOL;  Surgeon: Harvel Quale, MD;  [facility];  Service: Gastroenterology;  Laterality: N/A;  11:05   CORONARY ANGIOPLASTY WITH STENT PLACEMENT  2013; 04/10/2015   "this makes me a total of 7" (04/10/2015)   CORONARY ARTERY BYPASS GRAFT  1997   with (LIMA)   ESOPHAGEAL DILATION N/A 11/26/2015   Procedure: ESOPHAGEAL DILATION;  Surgeon: Rogene Houston, MD;  [facility];  Service: Endoscopy;  Laterality: N/A;   ESOPHAGEAL DILATION N/A 05/23/2017    Procedure: ESOPHAGEAL DILATION;  Surgeon: Rogene Houston, MD;  [facility];  Service: Endoscopy;  Laterality: N/A;   ESOPHAGEAL DILATION N/A 06/16/2017   Procedure: ESOPHAGEAL DILATION;  Surgeon: Rogene Houston, MD;  [facility];  Service: Endoscopy;  Laterality: N/A;   ESOPHAGOGASTRODUODENOSCOPY N/A 11/26/2015   Procedure: ESOPHAGOGASTRODUODENOSCOPY (EGD);  Surgeon: Rogene Houston, MD;  [facility];  Service: Endoscopy;  Laterality: N/A;  1:25   ESOPHAGOGASTRODUODENOSCOPY N/A 05/23/2017   Procedure: ESOPHAGOGASTRODUODENOSCOPY (EGD);  Surgeon: Rogene Houston, MD;  [facility];  Service: Endoscopy;  Laterality: N/A;  1:15   ESOPHAGOGASTRODUODENOSCOPY N/A 06/16/2017   Procedure: ESOPHAGOGASTRODUODENOSCOPY (EGD);  Surgeon: Rogene Houston, MD;  [facility];  Service: Endoscopy;  Laterality: N/A;  1240   ESOPHAGOGASTRODUODENOSCOPY (EGD) WITH PROPOFOL N/A 01/20/2021   Procedure: ESOPHAGOGASTRODUODENOSCOPY (EGD) WITH PROPOFOL;  Surgeon: Harvel Quale, MD;  [facility];  Service: Gastroenterology;  Laterality: N/A;   HERNIA REPAIR Left    KNEE CARTILAGE SURGERY Bilateral    LEFT HEART CATH AND CORS/GRAFTS ANGIOGRAPHY N/A 09/08/2016  Procedure: LEFT HEART CATH AND CORS/GRAFTS ANGIOGRAPHY;  Surgeon: Martinique, Peter M, MD;  Location: [place] [facility];  Service: Cardiovascular;  Laterality: N/A;   LEFT HEART CATHETERIZATION WITH CORONARY ANGIOGRAM N/A 09/08/2011   Procedure: LEFT HEART CATHETERIZATION WITH CORONARY ANGIOGRAM;  Surgeon: Sherren Mocha, MD;  Location: [facility] CATH LAB;  Service: Cardiovascular;  Laterality: N/A;   PERCUTANEOUS CORONARY STENT INTERVENTION (PCI-S) Right 09/08/2011   Procedure: PERCUTANEOUS CORONARY STENT INTERVENTION (PCI-S);  Surgeon: Sherren Mocha, MD;  Location: [facility] CATH LAB;  Service: Cardiovascular;  Laterality: Right;   POLYPECTOMY  01/20/2021   Procedure: POLYPECTOMY;  Surgeon: Montez Morita, Quillian Quince, MD;  [facility];  Service: Gastroenterology;;   ROBOT ASSISTED LAPAROSCOPIC RADICAL PROSTATECTOMY      Current Medications:*** Current Meds  Medication Sig   amLODipine (NORVASC) 10 MG tablet Take 10 mg by mouth daily.   aspirin 81 MG tablet Take 1 tablet (81 mg total) by mouth at bedtime.   atorvastatin (LIPITOR) 40 MG tablet Take 1 tablet (40 mg total) by mouth daily.   Black Currant Seed Oil 500 MG CAPS Take by mouth.   carvedilol (COREG) 3.125 MG tablet Take 1 tablet (3.125 mg total) by mouth 2 (two) times daily.   celecoxib (CELEBREX) 200 MG capsule Take 1 capsule (200 mg total) by mouth daily.   cetirizine (ZYRTEC) 10 MG tablet Take 10 mg by mouth daily.   clopidogrel (PLAVIX) 75 MG tablet Take 1 tablet (75 mg total) by mouth in the morning.   cyanocobalamin (,VITAMIN B-12,) 1000 MCG/ML injection Inject 1,000 mcg into the muscle every 30 (thirty) days.   Fenofibrate 160 mg tablet  Take 1 tablet (160 mg total) by mouth daily.    ferrous sulfate (FERROUSUL) 325 (65 FE) MG tablet Take 1 tablet (325 mg total) by mouth 2 (two) times daily with a meal.   Flaxseed, Linseed, (FLAXSEED OIL PO) Take by mouth. One teaspoon every 3 days   gabapentin (NEURONTIN) 600 MG tablet Take 600 mg by mouth 3 (three) times daily.   glucosamine-chondroitin 500-400 MG tablet Take 1 tablet by mouth daily.   isosorbide mononitrate (IMDUR) 60 MG 24 hr tablet Take 1.5 tablets (90 mg total) by mouth daily.   levothyroxine (SYNTHROID, LEVOTHROID) 150 MCG tablet Take 150 mcg by mouth daily before breakfast.   meclizine (ANTIVERT) 25 MG tablet Take 1 tablet (25 mg total) by mouth 2 (two) times daily as needed for dizziness.   nitroGLYCERIN (NITROSTAT) 0.4 MG SL tablet Place 1 tablet (0.4 mg total) under the tongue every 5 (five) minutes as needed for chest pain.   Omega-3 Fatty Acids (FISH OIL) 1200 MG CAPS Take 1,200 mg by mouth daily.    Polyvinyl Alcohol-Povidone (REFRESH OP) Place 1  drop into both eyes daily as needed (dry eyes).   Potassium 99 MG TABS Take 198 mg by mouth at bedtime.   psyllium (METAMUCIL) 58.6 % powder Take 1 packet by mouth. Takes about every other day   traMADol (ULTRAM) 50 MG tablet Take 50 mg by mouth 3 (three) times daily.   isosorbide mononitrate (IMDUR) 60 MG 24 hr tablet Take 1 tablet (60 mg total) by mouth daily. Please call [phone] to schedule an appointment for future refills thank you. 1st attempt.     Allergies:   Codeine and Pollen extract   Social History   Socioeconomic History   Marital status: Widowed    Spouse name: Not on file   Number of children: Not on file  Years of education: Not on file   Highest education level: Not on file  Occupational History   Occupation: semi-retired  Tobacco Use   Smoking status: Former    Types: Cigars    Passive exposure: Past   Smokeless tobacco: Never   Tobacco comments:    "quit smoking in ~ 1978"  Vaping Use   Vaping Use: Never used  Substance and Sexual Activity   Alcohol use: Yes    Comment: occasionally    Drug use: No   Sexual activity: Not Currently  Other Topics Concern   Not on file  Social History Narrative   Not on file   Social Determinants of Health   Financial Resource Strain: Low Risk  (09/16/2021)   Overall Financial Resource Strain (CARDIA)    Difficulty of Paying Living Expenses: Not hard at all  Food Insecurity: Not on file  Transportation Needs: No Transportation Needs (09/16/2021)   PRAPARE - Hydrologist (Medical): No    Lack of Transportation (Non-Medical): No  Physical Activity: Not on file  Stress: Not on file  Social Connections: Not on file     Family History: The patient's family history includes Cancer (age of onset: 51) in his father; Stroke (age of onset: 41) in his mother.  ROS:  *** Review of Systems  Constitutional:  Positive for malaise/fatigue. Negative for chills, diaphoresis, fever and weight loss.   HENT:  Positive for hearing loss. Negative for congestion, ear discharge, ear pain, nosebleeds, sinus pain, sore throat and tinnitus.        See HPI.  Eyes: Negative.   Respiratory:  Positive for shortness of breath. Negative for cough, hemoptysis, sputum production, wheezing and stridor.        See HPI.   Cardiovascular:  Positive for chest pain. Negative for palpitations, orthopnea, claudication, leg swelling and PND.       See HPI.  Gastrointestinal: Negative.   Genitourinary: Negative.   Musculoskeletal: Negative.   Skin: Negative.   Neurological:  Positive for dizziness. Negative for tingling, tremors, sensory change, speech change, focal weakness, seizures, loss of consciousness, weakness and headaches.       See HPI.   Endo/Heme/Allergies: Negative.   Psychiatric/Behavioral: Negative.     Please see the history of present illness.    All other systems reviewed and are negative.  EKGs/Labs/Other Studies Reviewed:    The following studies were reviewed today:  *** EKG:  EKG is ordered today.  The ekg ordered today demonstrates NSR, 65 bpm, incomplete RBBB, ST segment changes, otherwise nothing acute.   NM Stress test on 10/12/21: Findings are consistent with prior myocardial infarction with peri-infarct ischemia. The study is intermediate risk.   No ST deviation was noted.   Left ventricular function is normal. End diastolic cavity size is normal.   Small, Mild fixed defect in the inferolateral wall that is mildy worse with stress. C/f small inferolateral scar with mild per-infarct ischemia    TTE on 10/09/2021: 1. Left ventricular ejection fraction, by estimation, is 65 to 70%. The  left ventricle has normal function. The left ventricle has no regional  wall motion abnormalities. There is moderate asymmetric left ventricular  hypertrophy. Left ventricular diastolic parameters are indeterminate.   2. Right ventricular systolic function is normal. The right ventricular   size is normal. Tricuspid regurgitation signal is inadequate for assessing  PA pressure.   3. Left atrial size was mildly dilated.   4. Right atrial size  was mildly dilated.   5. The mitral valve is grossly normal. No evidence of mitral valve  regurgitation. No evidence of mitral stenosis. Moderate mitral annular  calcification.   6. The aortic valve is tricuspid. There is mild calcification of the  aortic valve. There is mild thickening of the aortic valve. Aortic valve  regurgitation is not visualized. Mild aortic valve stenosis. Normal stoke  volume index.   7. The inferior vena cava is normal in size with <50% respiratory  variability, suggesting right atrial pressure of 8 mmHg.    Left heart cath on September 08, 2016: Mid RCA lesion, 100 %stenosed. RIMA. Dist RCA lesion, 0 %stenosed. A drug eluting . Ost RPDA lesion, 0 %stenosed. A drug eluting . Prox LAD lesion, 100 %stenosed. LIMA. Ost LM to LM lesion, 25 %stenosed. Ost Ramus to Ramus lesion, 25 %stenosed. The left ventricular systolic function is normal. LV end diastolic pressure is mildly elevated. The left ventricular ejection fraction is 55-65% by visual estimate.   1. 2 vessel occlusive CAD 2. Patent stents in the left main and large ramus intermediate branch 3. Patent stents in the distal RCA 4. Patent free RIMA to the distal RCA 5. Patent LIMA to the LAD 6. Normal LV function 7. Mildly elevated LVEDP.  Left heart cath on April 10, 2015: Mid RCA lesion, 100% stenosed. RIMA . Patent graft - free RIMA Prox LAD lesion, 100% stenosed. LIMA . LIMA-LAD patent Ost LM to LM lesion, 25% stenosed. The lesion was previously treated with a stent (unknown type). Ost Ramus to Ramus lesion, 25% stenosed. The lesion was previously treated with a stent (unknown type). Ost RPDA lesion, 100% stenosed. Post intervention, there is a 0% residual stenosis. Dist RCA lesion, 99% stenosed. Post intervention, there is a 0% residual  stenosis.   1. Severe native vessel CAD with total occlusion of the RCA, total occlusion of the LAD, and patency of the stented segment of the left main into the ramus.  2. Severe stenosis of the distal RCA treated successfully with PCI (drug-eluting stent) 3. Total occlusion of the PDA following distal RCA stenting, likely due to plaque shifting, treated successfully with PCI (DES), but with distal vessel occlusion possibly due to distal vessel dissection (pt asymptomatic)   Continue DAPT. Will transition to [place] tomorrow am. Pt would be a candidate for the Twilight Trial if he meets criteria.   Nuclear stress test on April 01, 2015: Nuclear stress EF: 59%. Downsloping ST segment depression ST segment depression of 1 mm was noted during stress in the II, III, V4, V5 and V6 leads, beginning at 6 minutes of stress, ending at 7 minutes of stress. Defect 1: There is a medium defect of moderate severity present in the basal inferior, mid inferior and mid inferolateral location. Findings consistent with ischemia. This is an intermediate risk study. The left ventricular ejection fraction is normal (55-65%).   There is a medium size, mild severity reversible defect in the basal and mid inferior and inferolateral walls consistent with ischemia. There were 1 mm ST depressions in the inferolateral leads that occurs toward the end of exercise and resolve early in the recovery, however recurs 2 minutes in recovery as downsloaping deppressions that persist 7 minutes into recovery (high risk feature).    2D echocardiogram on March 22, 2011: Left ventricle: The cavity size was normal. There was mild    concentric hypertrophy. Systolic function was normal. The    estimated ejection fraction was in the range  of 60% to    65%. Wall motion was normal; there were no regional wall    motion abnormalities.  - Aortic valve: Mildly to moderately calcified annulus.    Mildly thickened, mildly calcified  leaflets. There was    very mild stenosis. Valve area: 2.72cm^2(VTI). Valve area:    3.49cm^2 (Vmax).  - Right ventricle: The cavity size was normal. Wall    thickness was mildly increased.  - Atrial septum: No defect or patent foramen ovale was    identified.   Recent Labs: 10/06/2021: ALT 19; BUN 19; Creatinine, Ser 1.30; Hemoglobin 12.1; Magnesium 2.2; Platelets 176; Potassium 4.2; Sodium 143; TSH 0.375  Recent Lipid Panel    Component Value Date/Time   CHOL 121 10/06/2021 1542   TRIG 226 (H) 10/06/2021 1542   HDL 36 (L) 10/06/2021 1542   CHOLHDL 3.4 10/06/2021 1542   LDLCALC 49 10/06/2021 1542     Risk Assessment/Calculations:     RCRI score is 1. Perioperative risk of major cardiac event is 0.9%. Duke Activity Status Index score is 32.95. Functional Capacity in METS is 6.79.   Physical Exam:    VS:  There were no vitals taken for this visit.    Wt Readings from Last 3 Encounters:  10/06/21 216 lb 12.8 oz (98.3 kg)  09/22/21 214 lb 3.2 oz (97.2 kg)  01/14/21 225 lb (102.1 kg)    Orthostatic VS today: Lying, 137/72, heart rate 61 (lightheaded for 1 minute) Sitting, 132/70, heart rate 65, no symptoms Standing, 141/69, heart rate 68, no symptoms Standing for several minutes, 142/72, heart rate 66, no symptoms  GEN: Well developed, well nourished 76 y.o. Caucasian male in no acute distress HEENT: Normal NECK: No JVD; No carotid bruits CARDIAC: S1/S2, RRR RESPIRATORY:  Clear to auscultation without rales, wheezing or rhonchi  MUSCULOSKELETAL: No edema, No deformity  SKIN: Warm and dry NEUROLOGIC:  Alert and oriented x 3 PSYCHIATRIC:  Normal affect    ASSESSMENT:    No diagnosis found.  PLAN:    In order of problems listed above:  Preoperative cardiovascular risk assessment    Mr. Desa's perioperative risk of a major cardiac event is  % according to the Revised Cardiac Risk Index (RCRI).  Therefore, he is at low risk for perioperative complications.   His  functional capacity is excellent at   METs according to the Duke Activity Status Index (DASI). Recommendations: The patient requires an echocardiogram before a disposition can be made regarding surgical risk.                Antiplatelet and/or Anticoagulation Recommendations: Clopidogrel (Plavix) can be held for 5 days prior to his surgery and resumed as soon as possible post op. ASA should be continued without interruption in the perioperative period. This patient does not require antibiotic prophylaxis.   Fatigue, Dizziness Etiology multifactorial. Orthostatics negative today in office. No hypotension at home. 2D Echo in 2013 revealed EF 60-65%, mild LVH, mildly to moderately calcified annulus of aortic valve, very mild AS, otherwise normal. Will update 2D echo at this time before patient can proceed to upcoming surgery. Will obtain the following blood work: TSH, CBC, CMET, and Magnesium level. Continue Meclizine PRN and PT exercises for hx of vertigo.   4. CAD, HLD Chronic chest pain mainly noticed with exercise (walking up a steep hill outside) and relieved with rest. Last NST showed intermediate risk and prompted LHC to be done, revealed inferolateral ischemia. Discussed several options for ischemia evaluation, and  he is agreeable to a Lexiscan stress test prior to his upcoming GI procedure as outlined below. Risks and benefits regarding the test were discussed and he verbalized understanding and is agreeable to proceed. Will increase Imdur from 60 mg daily to 90 mg daily since orthostatics were negative in office today. ED precautions discussed. Will obtain the lab work as mentioned above. Continue current medication regimen. Will obtain FLP and CMET since he is due for this to be drawn.   Shared Decision Making/Informed Consent The risks [chest pain, shortness of breath, cardiac arrhythmias, dizziness, blood pressure fluctuations, myocardial infarction, stroke/transient ischemic attack, nausea,  vomiting, allergic reaction, radiation exposure, metallic taste sensation and life-threatening complications (estimated to be 1 in 10,000)], benefits (risk stratification, diagnosing coronary artery disease, treatment guidance) and alternatives of a nuclear stress test were discussed in detail with Mr. Miggins and he agrees to proceed.  5. HTN BP today 134/70. SBP at home around 150's, says SBP has been labile and using two different cuffs at home. Discussed to monitor BP using only arm cuff at home at least 2 hours after medications and sitting for 5-10 minutes. Will increase Imdur as mentioned above. Will obtain the blood work as mentioned above. Continue current medication regimen.   6. Aortic stenosis Very mild AS noted on 2D echo in 2013. Recent fatigue and dizziness, admits to recent DOE with these symptoms. Will update 2D echo at this time.   7. OSA on CPAP Compliant with use. Continue to follow with PCP.   8. Disposition: Follow up with me or Richardson Dopp, PA-C in 2 weeks or sooner if anything changes.    Medication Adjustments/Labs and Tests Ordered: Current medicines are reviewed at length with the patient today.  Concerns regarding medicines are outlined above.  No orders of the defined types were placed in this encounter.  No orders of the defined types were placed in this encounter.   There are no Patient Instructions on file for this visit.   Signed, Finis Bud, NP  10/21/2021 5:53 AM    [place]

## 2021-10-23 ENCOUNTER — Encounter (HOSPITAL_COMMUNITY)
Admission: RE | Admit: 2021-10-23 | Discharge: 2021-10-23 | Disposition: A | Discharge status: Home / Self Care | Payer: Medicare Other | Source: Ambulatory Visit | Attending: Gastroenterology | Admitting: Gastroenterology

## 2021-10-23 ENCOUNTER — Other Ambulatory Visit: Payer: Self-pay

## 2021-10-23 ENCOUNTER — Encounter (HOSPITAL_COMMUNITY): Payer: Self-pay

## 2021-10-28 ENCOUNTER — Encounter (HOSPITAL_COMMUNITY)
Admission: RE | Disposition: A | Discharge status: Home / Self Care | Payer: Self-pay | Source: Home / Self Care | Attending: Gastroenterology

## 2021-10-28 ENCOUNTER — Ambulatory Visit (HOSPITAL_COMMUNITY): Payer: Medicare Other | Admitting: Certified Registered Nurse Anesthetist

## 2021-10-28 ENCOUNTER — Encounter (HOSPITAL_COMMUNITY): Payer: Self-pay | Admitting: Gastroenterology

## 2021-10-28 ENCOUNTER — Ambulatory Visit (HOSPITAL_BASED_OUTPATIENT_CLINIC_OR_DEPARTMENT_OTHER): Payer: Medicare Other | Admitting: Certified Registered Nurse Anesthetist

## 2021-10-28 ENCOUNTER — Ambulatory Visit (HOSPITAL_COMMUNITY)
Admission: RE | Admit: 2021-10-28 | Discharge: 2021-10-28 | Disposition: A | Discharge status: Home / Self Care | Payer: Medicare Other | Source: Intra-hospital | Attending: Gastroenterology | Admitting: Gastroenterology

## 2021-10-28 DIAGNOSIS — Z87891 Personal history of nicotine dependence: Secondary | ICD-10-CM

## 2021-10-28 DIAGNOSIS — K2950 Unspecified chronic gastritis without bleeding: Secondary | ICD-10-CM | POA: Diagnosis not present

## 2021-10-28 DIAGNOSIS — Z8551 Personal history of malignant neoplasm of bladder: Secondary | ICD-10-CM | POA: Diagnosis not present

## 2021-10-28 DIAGNOSIS — I2510 Atherosclerotic heart disease of native coronary artery without angina pectoris: Secondary | ICD-10-CM

## 2021-10-28 DIAGNOSIS — D649 Anemia, unspecified: Secondary | ICD-10-CM | POA: Insufficient documentation

## 2021-10-28 DIAGNOSIS — Z8546 Personal history of malignant neoplasm of prostate: Secondary | ICD-10-CM | POA: Insufficient documentation

## 2021-10-28 DIAGNOSIS — K2289 Other specified disease of esophagus: Secondary | ICD-10-CM | POA: Diagnosis not present

## 2021-10-28 DIAGNOSIS — I10 Essential (primary) hypertension: Secondary | ICD-10-CM | POA: Insufficient documentation

## 2021-10-28 DIAGNOSIS — K31A12 Gastric intestinal metaplasia without dysplasia, involving the body (corpus): Secondary | ICD-10-CM | POA: Diagnosis not present

## 2021-10-28 DIAGNOSIS — K219 Gastro-esophageal reflux disease without esophagitis: Secondary | ICD-10-CM | POA: Diagnosis not present

## 2021-10-28 DIAGNOSIS — R1310 Dysphagia, unspecified: Secondary | ICD-10-CM

## 2021-10-28 DIAGNOSIS — D759 Disease of blood and blood-forming organs, unspecified: Secondary | ICD-10-CM | POA: Diagnosis not present

## 2021-10-28 DIAGNOSIS — K3189 Other diseases of stomach and duodenum: Secondary | ICD-10-CM | POA: Insufficient documentation

## 2021-10-28 DIAGNOSIS — K317 Polyp of stomach and duodenum: Secondary | ICD-10-CM

## 2021-10-28 DIAGNOSIS — Z951 Presence of aortocoronary bypass graft: Secondary | ICD-10-CM | POA: Diagnosis not present

## 2021-10-28 DIAGNOSIS — D131 Benign neoplasm of stomach: Secondary | ICD-10-CM

## 2021-10-28 DIAGNOSIS — E785 Hyperlipidemia, unspecified: Secondary | ICD-10-CM | POA: Insufficient documentation

## 2021-10-28 DIAGNOSIS — E039 Hypothyroidism, unspecified: Secondary | ICD-10-CM | POA: Insufficient documentation

## 2021-10-28 DIAGNOSIS — Z7722 Contact with and (suspected) exposure to environmental tobacco smoke (acute) (chronic): Secondary | ICD-10-CM | POA: Insufficient documentation

## 2021-10-28 HISTORY — PX: ESOPHAGEAL DILATION: SHX303

## 2021-10-28 HISTORY — PX: ESOPHAGOGASTRODUODENOSCOPY (EGD) WITH PROPOFOL: SHX5813

## 2021-10-28 HISTORY — PX: BIOPSY: SHX5522

## 2021-10-28 LAB — VITAMIN B12: Vitamin B-12: 143 pg/mL — ABNORMAL LOW (ref 180–914)

## 2021-10-28 LAB — IRON AND TIBC
Iron: 141 ug/dL (ref 45–182)
Saturation Ratios: 41 % — ABNORMAL HIGH (ref 17.9–39.5)
TIBC: 347 ug/dL (ref 250–450)
UIBC: 206 ug/dL

## 2021-10-28 LAB — FERRITIN: Ferritin: 59 ng/mL (ref 24–336)

## 2021-10-28 SURGERY — ESOPHAGOGASTRODUODENOSCOPY (EGD) WITH PROPOFOL
Anesthesia: General

## 2021-10-28 MED ORDER — LIDOCAINE HCL (CARDIAC) PF 100 MG/5ML IV SOSY
PREFILLED_SYRINGE | INTRAVENOUS | Status: DC | PRN
  Administered 2021-10-28: 50 mg via INTRAVENOUS

## 2021-10-28 MED ORDER — PROPOFOL 10 MG/ML IV BOLUS
INTRAVENOUS | Status: DC | PRN
  Administered 2021-10-28: 60 mg via INTRAVENOUS

## 2021-10-28 MED ORDER — PHENYLEPHRINE HCL (PRESSORS) 10 MG/ML IV SOLN
INTRAVENOUS | Status: DC | PRN
  Administered 2021-10-28: 80 ug via INTRAVENOUS

## 2021-10-28 MED ORDER — LACTATED RINGERS IV SOLN: INTRAVENOUS | Status: DC

## 2021-10-28 MED ORDER — PROPOFOL 500 MG/50ML IV EMUL
INTRAVENOUS | Status: AC
  Filled 2021-10-28: qty 50

## 2021-10-28 MED ORDER — PROPOFOL 500 MG/50ML IV EMUL
INTRAVENOUS | Status: DC | PRN
  Administered 2021-10-28: 100 ug/kg/min via INTRAVENOUS

## 2021-10-28 NOTE — H&P (Signed)
[name] is an 76 y.o. male.   Chief Complaint: Gastric intestinal metaplasia, atrophic gastritis and gastric adenomas HPI: MARICO BUCKLE is a 76 y.o. male with past medical history of Barrett's esophagus, GERD, hyperlipidemia, hypertension, hypothyroidism, coronary artery disease status post CABG, prostate cancer, bladder cancer, coming for follow-up of gastric intestinal metaplasia, atrophic gastritis and gastric adenomas.  The patient denies having any nausea, vomiting, fever, chills, hematochezia, melena, hematemesis, abdominal distention, abdominal pain, diarrhea, jaundice, pruritus or weight loss.   Past Medical History:  Diagnosis Date   Anginal pain ([place])    Arthritis    "all over"   Bladder tumor    Chronic lower back pain    Coronary artery disease    a. s/p CABG x 2 (LIMA->LAD, RIMA->RCA);  b. s/p multiple PCI's to Ramus;  c. 08/2011 Cath/PCI: LM 70% into ramus with 70-80% there->treated wtih 3.5x18 Xience Xpedition DES, LCX  nonobs, RCA occluded.  RIMA & LIMA patent, EF 55-65%   DJD (degenerative joint disease)    GERD (gastroesophageal reflux disease)    History of gout    Hyperlipidemia    Hypertension    Hypothyroidism    Neuropathy    OSA on CPAP    Prostate cancer [facility])     Past Surgical History:  Procedure Laterality Date   ABDOMINAL HERNIA REPAIR     BIOPSY  11/26/2015   Procedure: BIOPSY;  Surgeon: Rogene Houston, MD;  [facility];  Service: Endoscopy;;  gastric esophagus   BIOPSY  01/20/2021   Procedure: BIOPSY;  Surgeon: Harvel Quale, MD;  [facility];  Service: Gastroenterology;;   BLADDER TUMOR EXCISION     BROW LIFT Bilateral 04/27/2019   Procedure: BILATERAL BLEPHAROPLASTY;  Surgeon: Baruch Goldmann, MD;  [facility]: [facility];  Service: Ophthalmology;  Laterality: Bilateral;   CARDIAC CATHETERIZATION N/A 04/10/2015   Procedure: Left Heart Cath and Cors/Grafts Angiography;  Surgeon: Sherren Mocha, MD;   Location: [place] CV LAB;  Service: Cardiovascular;  Laterality: N/A;   CARDIAC CATHETERIZATION N/A 04/10/2015   Procedure: Coronary Stent Intervention;  Surgeon: Sherren Mocha, MD;  Location: [place] CV LAB;  Service: Cardiovascular;  Laterality: N/A;   CATARACT EXTRACTION W/PHACO Right 06/23/2015   Procedure: CATARACT EXTRACTION PHACO AND INTRAOCULAR LENS PLACEMENT RIGHT EYE; CDE:  7.08;  Surgeon: Tonny Branch, MD;  [facility];  Service: Ophthalmology;  Laterality: Right;   CATARACT EXTRACTION W/PHACO Left 08/25/2015   Procedure: CATARACT EXTRACTION PHACO AND INTRAOCULAR LENS PLACEMENT LEFT EYE; CDE:  8.18;  Surgeon: Tonny Branch, MD;  [facility]: [facility];  Service: Ophthalmology;  Laterality: Left;   COLONOSCOPY N/A 12/28/2012   Procedure: COLONOSCOPY;  Surgeon: Rogene Houston, MD;  [facility];  Service: Endoscopy;  Laterality: N/A;  830 rescheduled   COLONOSCOPY WITH PROPOFOL N/A 01/20/2021   Procedure: COLONOSCOPY WITH PROPOFOL;  Surgeon: Harvel Quale, MD;  [facility];  Service: Gastroenterology;  Laterality: N/A;  11:05   CORONARY ANGIOPLASTY WITH STENT PLACEMENT  2013; 04/10/2015   "this makes me a total of 7" (04/10/2015)   CORONARY ARTERY BYPASS GRAFT  1997   with (LIMA)   ESOPHAGEAL DILATION N/A 11/26/2015   Procedure: ESOPHAGEAL DILATION;  Surgeon: Rogene Houston, MD;  [facility];  Service: Endoscopy;  Laterality: N/A;   ESOPHAGEAL DILATION N/A 05/23/2017   Procedure: ESOPHAGEAL DILATION;  Surgeon: Rogene Houston, MD;  [facility];  Service: Endoscopy;  Laterality:  N/A;   ESOPHAGEAL DILATION N/A 06/16/2017   Procedure: ESOPHAGEAL DILATION;  Surgeon: Rogene Houston, MD;  [facility];  Service: Endoscopy;  Laterality: N/A;   ESOPHAGOGASTRODUODENOSCOPY N/A 11/26/2015   Procedure: ESOPHAGOGASTRODUODENOSCOPY (EGD);  Surgeon: Rogene Houston, MD;  [facility];  Service: Endoscopy;  Laterality: N/A;   1:25   ESOPHAGOGASTRODUODENOSCOPY N/A 05/23/2017   Procedure: ESOPHAGOGASTRODUODENOSCOPY (EGD);  Surgeon: Rogene Houston, MD;  [facility];  Service: Endoscopy;  Laterality: N/A;  1:15   ESOPHAGOGASTRODUODENOSCOPY N/A 06/16/2017   Procedure: ESOPHAGOGASTRODUODENOSCOPY (EGD);  Surgeon: Rogene Houston, MD;  [facility];  Service: Endoscopy;  Laterality: N/A;  1240   ESOPHAGOGASTRODUODENOSCOPY (EGD) WITH PROPOFOL N/A 01/20/2021   Procedure: ESOPHAGOGASTRODUODENOSCOPY (EGD) WITH PROPOFOL;  Surgeon: Harvel Quale, MD;  [facility];  Service: Gastroenterology;  Laterality: N/A;   HERNIA REPAIR Left    KNEE CARTILAGE SURGERY Bilateral    LEFT HEART CATH AND CORS/GRAFTS ANGIOGRAPHY N/A 09/08/2016   Procedure: LEFT HEART CATH AND CORS/GRAFTS ANGIOGRAPHY;  Surgeon: Martinique, Peter M, MD;  Location: [place] [facility] LAB;  Service: Cardiovascular;  Laterality: N/A;   LEFT HEART CATHETERIZATION WITH CORONARY ANGIOGRAM N/A 09/08/2011   Procedure: LEFT HEART CATHETERIZATION WITH CORONARY ANGIOGRAM;  Surgeon: Sherren Mocha, MD;  Location: [facility] CATH LAB;  Service: Cardiovascular;  Laterality: N/A;   PERCUTANEOUS CORONARY STENT INTERVENTION (PCI-S) Right 09/08/2011   Procedure: PERCUTANEOUS CORONARY STENT INTERVENTION (PCI-S);  Surgeon: Sherren Mocha, MD;  Location: [facility] CATH LAB;  Service: Cardiovascular;  Laterality: Right;   POLYPECTOMY  01/20/2021   Procedure: POLYPECTOMY;  Surgeon: Harvel Quale, MD;  [facility];  Service: Gastroenterology;;   ROBOT ASSISTED LAPAROSCOPIC RADICAL PROSTATECTOMY      Family History  Problem Relation Age of Onset   Cancer Father 80       died   Stroke Mother 64       died   Social History:  reports that he has quit smoking. His smoking use included cigars. He has been exposed to tobacco smoke. He has never used smokeless tobacco. He reports current alcohol use. He reports that he does not use  drugs.  Allergies:  Allergies  Allergen Reactions   Codeine Nausea Only   Pollen Extract Other (See Comments)    Seasonal    Medications Prior to Admission  Medication Sig Dispense Refill   amLODipine (NORVASC) 10 MG tablet Take 10 mg by mouth in the morning.     aspirin EC 81 MG tablet Take 81 mg by mouth every evening. 30 tablet    atorvastatin (LIPITOR) 40 MG tablet Take 20 mg by mouth every evening.     B Complex-C (B-COMPLEX WITH VITAMIN C) tablet Take 1 tablet by mouth every evening.     BLACK CURRANT SEED OIL PO Take 1 each by mouth 2 (two) times a week. 1 teaspoonful twice weekly     carvedilol (COREG) 3.125 MG tablet Take 1 tablet (3.125 mg total) by mouth 2 (two) times daily. 180 tablet 3   celecoxib (CELEBREX) 200 MG capsule Take 200 mg by mouth in the morning.     cetirizine (ZYRTEC) 10 MG tablet Take 10 mg by mouth every evening.     cholecalciferol (VITAMIN D3) 25 MCG (1000 UNIT) tablet Take 1,000 Units by mouth every evening.     clopidogrel (PLAVIX) 75 MG tablet Take 1 tablet (75 mg total) by mouth in the morning. 90 tablet 3  Coenzyme Q10 300 MG CAPS Take 300 mg by mouth every evening.     cyanocobalamin (,VITAMIN B-12,) 1000 MCG/ML injection Inject 1,000 mcg into the muscle See admin instructions. Inject (1000 mcg) intramuscularly once monthly as needed for B-12 deficiency.  2   fenofibrate 160 MG tablet Take 1 tablet (160 mg total) by mouth daily. (Patient taking differently: Take 160 mg by mouth every evening.) 30 tablet 11   ferrous sulfate (FERROUSUL) 325 (65 FE) MG tablet Take 1 tablet (325 mg total) by mouth 2 (two) times daily with a meal. 180 tablet 3   gabapentin (NEURONTIN) 600 MG tablet Take 600 mg by mouth 3 (three) times daily.  3   GLUCOSAMINE-CHONDROITIN PO Take 1 tablet by mouth every evening.     isosorbide mononitrate (IMDUR) 60 MG 24 hr tablet Take 1.5 tablets (90 mg total) by mouth daily. (Patient taking differently: Take 90 mg by mouth every  evening.) 135 tablet 3   levothyroxine (SYNTHROID, LEVOTHROID) 150 MCG tablet Take 150 mcg by mouth daily before breakfast.     meclizine (ANTIVERT) 25 MG tablet Take 1 tablet (25 mg total) by mouth 2 (two) times daily as needed for dizziness. (Patient taking differently: Take 12.5 mg by mouth at bedtime as needed for dizziness.) 30 tablet 0   nitroGLYCERIN (NITROSTAT) 0.4 MG SL tablet Place 1 tablet (0.4 mg total) under the tongue every 5 (five) minutes as needed for chest pain. 25 tablet 3   Omega-3 Fatty Acids (FISH OIL) 1000 MG CAPS Take 1 capsule by mouth in the morning.     Polyethyl Glycol-Propyl Glycol (LUBRICANT EYE DROPS) 0.4-0.3 % SOLN Place 1-2 drops into both eyes 3 (three) times daily as needed (dry/irritated eyes.).     Potassium 99 MG TABS Take 99 mg by mouth in the morning and at bedtime.     Probiotic Product (PROBIOTIC PO) Take 1 capsule by mouth every evening.     psyllium (METAMUCIL) 58.6 % powder Take 1 packet by mouth 2 (two) times a week.     traMADol (ULTRAM) 50 MG tablet Take 50 mg by mouth 3 (three) times daily.  1    No results found for this or any previous visit (from the past 48 hour(s)). No results found.  Review of Systems  All other systems reviewed and are negative.   Blood pressure (!) 149/65, pulse (!) 51, temperature 97.9 F (36.6 C), temperature source Oral, resp. rate 18, height 6' (1.829 m), weight 98.3 kg, SpO2 100 %. Physical Exam  GENERAL: The patient is AO x3, in no acute distress. HEENT: Head is normocephalic and atraumatic. EOMI are intact. Mouth is well hydrated and without lesions. NECK: Supple. No masses LUNGS: Clear to auscultation. No presence of rhonchi/wheezing/rales. Adequate chest expansion HEART: RRR, normal s1 and s2. ABDOMEN: Soft, nontender, no guarding, no peritoneal signs, and nondistended. BS +. No masses. EXTREMITIES: Without any cyanosis, clubbing, rash, lesions or edema. NEUROLOGIC: AOx3, no focal motor deficit. SKIN: no  jaundice, no rashes  Assessment/Plan DECLYN DELSOL is a 76 y.o. male with past medical history of Barrett's esophagus, GERD, hyperlipidemia, hypertension, hypothyroidism, coronary artery disease status post CABG, prostate cancer, bladder cancer, coming for follow-up of gastric intestinal metaplasia, atrophic gastritis and gastric adenomas.  We will proceed esophagogastroduodenospy.  Harvel Quale, MD 10/28/2021, 11:29 AM

## 2021-10-28 NOTE — Anesthesia Preprocedure Evaluation (Signed)
Anesthesia Evaluation  Patient identified by MRN, date of birth, ID band Patient awake    Reviewed: Allergy & Precautions, H&P , NPO status , Patient's Chart, lab work & pertinent test results, reviewed documented beta blocker date and time   Airway Mallampati: II  TM Distance: >3 FB Neck ROM: full    Dental no notable dental hx.    Pulmonary neg pulmonary ROS, former smoker,    Pulmonary exam normal breath sounds clear to auscultation       Cardiovascular Exercise Tolerance: Good hypertension, + CAD   Rhythm:regular Rate:Normal     Neuro/Psych negative neurological ROS  negative psych ROS   GI/Hepatic Neg liver ROS, GERD  Medicated,  Endo/Other  Hypothyroidism   Renal/GU negative Renal ROS  negative genitourinary   Musculoskeletal   Abdominal   Peds  Hematology  (+) Blood dyscrasia, anemia ,   Anesthesia Other Findings   Reproductive/Obstetrics negative OB ROS                             Anesthesia Physical Anesthesia Plan  ASA: 3  Anesthesia Plan: General   Post-op Pain Management:    Induction:   PONV Risk Score and Plan: Propofol infusion  Airway Management Planned:   Additional Equipment:   Intra-op Plan:   Post-operative Plan:   Informed Consent: I have reviewed the patients History and Physical, chart, labs and discussed the procedure including the risks, benefits and alternatives for the proposed anesthesia with the patient or authorized representative who has indicated his/her understanding and acceptance.     Dental Advisory Given  Plan Discussed with: CRNA  Anesthesia Plan Comments:         Anesthesia Quick Evaluation

## 2021-10-28 NOTE — Transfer of Care (Signed)
Immediate Anesthesia Transfer of Care Note  Patient: [name]  Procedure(s) Performed: ESOPHAGOGASTRODUODENOSCOPY (EGD) WITH PROPOFOL BIOPSY ESOPHAGEAL DILATION  Patient Location: PACU  Anesthesia Type:General  Level of Consciousness: drowsy, patient cooperative and responds to stimulation  Airway & Oxygen Therapy: Patient Spontanous Breathing and Patient connected to nasal cannula oxygen  Post-op Assessment: Report given to RN, Post -op Vital signs reviewed and stable, Patient moving all extremities X 4 and Patient able to stick tongue midline  Post vital signs: Reviewed  Last Vitals:  Vitals Value Taken Time  BP 114/59 10/28/21 1336  Temp 36.6 C 10/28/21 1336  Pulse 60 10/28/21 1336  Resp 13 10/28/21 1336  SpO2 96 % 10/28/21 1336    Last Pain:  Vitals:   10/28/21 1336  TempSrc: Oral  PainSc: 0-No pain         Complications: No notable events documented.

## 2021-10-28 NOTE — Discharge Instructions (Addendum)
You are being discharged to home.  Resume your previous diet.  We are waiting for your pathology results.  Your physician has recommended a repeat upper endoscopy for surveillance based on pathology results.  Restart Plavix tomorrow.

## 2021-10-28 NOTE — Progress Notes (Signed)
Pt status given to girlfriend. Voiced understanding.

## 2021-10-28 NOTE — Op Note (Signed)
[facility] Patient Name: [name] Procedure Date: 10/28/2021 12:00 PM MRN: [id number] Date of Birth: 09-12-1945 Attending MD: [name] ,  CSN: [id number] Age: 76 Admit Type: Outpatient Procedure:                Upper GI endoscopy Indications:              Dysphagia, Follow-up of benign gastric tumor,                            intestinal metaplasia Providers:                [name], Janeece Riggers, RN, Everardo Pacific Referring MD:              Medicines:                Monitored Anesthesia Care Complications:            No immediate complications. Estimated Blood Loss:     Estimated blood loss: none. Procedure:                Pre-Anesthesia Assessment:                           - Prior to the procedure, a History and Physical                            was performed, and patient medications, allergies                            and sensitivities were reviewed. The patient's                            tolerance of previous anesthesia was reviewed.                           - The risks and benefits of the procedure and the                            sedation options and risks were discussed with the                            patient. All questions were answered and informed                            consent was obtained.                           - ASA Grade Assessment: III - A patient with severe                            systemic disease.                           After obtaining informed consent, the endoscope was                            passed under direct vision. Throughout the  procedure, the patient's blood pressure, pulse, and                            oxygen saturations were monitored continuously. The                            GIF-H190 ([id number]) scope was introduced through the                            mouth, and advanced to the second part of duodenum.                            The upper GI endoscopy was  accomplished without                            difficulty. The patient tolerated the procedure                            well. Scope In: 1:10:03 PM Scope Out: 1:30:12 PM Total Procedure Duration: 0 hours 20 minutes 9 seconds  Findings:      No endoscopic abnormality was evident in the esophagus to explain the       patient's complaint of dysphagia. It was decided, however, to proceed       with dilation of the entire esophagus. A guidewire was placed and the       scope was withdrawn. Dilation was performed with a Savary dilator with       no resistance at 18 mm. No mucosal disruption was seen upon reinspection.      The Z-line was irregular and was found 41 cm from the incisors.      Multiple 1 to 2 mm semi-sessile polyps with no bleeding were found in       the gastric body. These polyps were removed with a jumbo cold forceps.       Resection and retrieval were complete.      Diffuse atrophic mucosa was found in the entire examined stomach.       Biopsies were taken with a cold forceps for histology.      The examined duodenum was normal. Impression:               following [place] protocol.                           - No endoscopic esophageal abnormality to explain                            patient's dysphagia. Esophagus dilated. Dilated.                           - Z-line irregular, 41 cm from the incisors.                           - Multiple gastric polyps. Resected and retrieved.                           - Gastric mucosal atrophy. Biopsied.                           -  Normal examined duodenum. Moderate Sedation:      Per Anesthesia Care Recommendation:           - Discharge patient to home (ambulatory).                           - Resume previous diet.                           - Await pathology results.                           - Repeat upper endoscopy for surveillance based on                            pathology results. Procedure Code(s):        --- Professional ---                            [phone], Esophagogastroduodenoscopy, flexible,                            transoral; with insertion of guide wire followed by                            passage of dilator(s) through esophagus over guide                            wire                           24235, 35, Esophagogastroduodenoscopy, flexible,                            transoral; with biopsy, single or multiple Diagnosis Code(s):        --- Professional ---                           R13.10, Dysphagia, unspecified                           K22.8, Other specified diseases of esophagus                           K31.7, Polyp of stomach and duodenum                           K31.89, Other diseases of stomach and duodenum                           D13.1, Benign neoplasm of stomach CPT copyright 2019 American Medical Association. All rights reserved. The codes documented in this report are preliminary and upon coder review may  be revised to meet current compliance requirements. [name], MD [name],  10/28/2021 1:39:44 PM This report has been signed electronically. Number of Addenda: 0

## 2021-10-29 DIAGNOSIS — C679 Malignant neoplasm of bladder, unspecified: Secondary | ICD-10-CM | POA: Diagnosis not present

## 2021-10-29 DIAGNOSIS — N3289 Other specified disorders of bladder: Secondary | ICD-10-CM | POA: Diagnosis not present

## 2021-10-29 LAB — INTRINSIC FACTOR ANTIBODIES: Intrinsic Factor: 25.9 AU/mL — ABNORMAL HIGH (ref 0.0–1.1)

## 2021-10-29 LAB — VITAMIN D 25 HYDROXY (VIT D DEFICIENCY, FRACTURES)

## 2021-10-29 NOTE — Anesthesia Postprocedure Evaluation (Signed)
Anesthesia Post Note  Patient: [name] [facility]  Procedure(s) Performed: ESOPHAGOGASTRODUODENOSCOPY (EGD) WITH PROPOFOL BIOPSY ESOPHAGEAL DILATION  Patient location during evaluation: Phase II Anesthesia Type: General Level of consciousness: awake Pain management: pain level controlled Vital Signs Assessment: post-procedure vital signs reviewed and stable Respiratory status: spontaneous breathing and respiratory function stable Cardiovascular status: blood pressure returned to baseline and stable Postop Assessment: no headache and no apparent nausea or vomiting Anesthetic complications: no Comments: Late entry   No notable events documented.   Last Vitals:  Vitals:   10/28/21 1114 10/28/21 1336  BP: (!) 149/65 (!) 114/59  Pulse: (!) 51 60  Resp: 18 13  Temp: 36.6 C 36.6 C  SpO2: 100% 96%    Last Pain:  Vitals:   10/28/21 1406  TempSrc:   PainSc: 0-No pain                 Louann Sjogren

## 2021-11-02 LAB — SURGICAL PATHOLOGY

## 2021-11-03 ENCOUNTER — Encounter (HOSPITAL_COMMUNITY): Payer: Self-pay | Admitting: Gastroenterology

## 2021-11-03 NOTE — Progress Notes (Signed)
Evaluate malabsorption of vitamins

## 2021-11-04 ENCOUNTER — Ambulatory Visit: Payer: Self-pay | Admitting: *Deleted

## 2021-11-04 NOTE — Patient Instructions (Addendum)
Visit Information  Thank you for taking time to visit with me today. Please don't hesitate to contact me if I can be of assistance to you before our next scheduled telephone appointment.  Following are the goals we discussed today:  Keep monitoring blood pressure daily Remember appointments on 10/30 with cardiology and 12/6 with urology at [place] next appointment is by telephone on 12/27  Please call the care guide team at [phone] if you need to cancel or reschedule your appointment.   Please call the Suicide and Crisis Lifeline: 988 call the [place] National Suicide Prevention Lifeline: 7573909196 or TTY: 575-420-5282 TTY (709)303-0591) to talk to a trained counselor call 1-800-273-TALK (toll free, 24 hour hotline) call the [facility]: 419-764-7515 call 911 if you are experiencing a Mental Health or [place] or need someone to talk to.  Patient verbalizes understanding of instructions and care plan provided today and agrees to view in [place]. Active MyChart status and patient understanding of how to access instructions and care plan via MyChart confirmed with patient.     The patient has been provided with contact information for the care management team and has been advised to call with any health related questions or concerns.   Valente David, RN, MSN, [place] Care Management Care Management Coordinator [phone]

## 2021-11-04 NOTE — Patient Outreach (Signed)
  Care Coordination   Follow Up Visit Note   11/04/2021 Name: [name] MRN: [id number] DOB: [birth year]  [name] is a 76 y.o. year old male who sees Sharilyn Sites, MD for primary care. I spoke with  [name] by phone today.  What matters to the patients health and wellness today?  Report dysphagia is much better, able to eat now without difficulty.  Awaiting procedure by urology for bladder issues.  Denies any urgent concerns, encouraged to contact this care manager with questions.     Goals Addressed             This Visit's Progress    Care Coordination Services   On track    Care Coordination Interventions: Evaluation of current treatment plan related to cystoscopy and patient's adherence to plan as established by provider Advised patient to provide appropriate vaccination information to provider or CM team member at next visit Reviewed scheduled/upcoming provider appointments including cardiology on 10/30, pre-op assessment with urology on 12/4 and procedure scheduled for 12/20 Reviewed results of endoscopy and stretching of esophagus Reviewed blood pressure readings (range 140s/70s)         SDOH assessments and interventions completed:  No     Care Coordination Interventions Activated:  Yes  Care Coordination Interventions:  Yes, provided   Follow up plan: Follow up call scheduled for 12/27    Encounter Outcome:  Pt. Visit Completed   Valente David, RN, MSN, [place] Care Management Care Management Coordinator [phone]

## 2021-11-05 ENCOUNTER — Encounter (INDEPENDENT_AMBULATORY_CARE_PROVIDER_SITE_OTHER): Payer: Self-pay | Admitting: *Deleted

## 2021-11-09 ENCOUNTER — Ambulatory Visit: Payer: Medicare Other | Admitting: Nurse Practitioner

## 2021-11-10 NOTE — Progress Notes (Unsigned)
Cardiology Office Note:    Date:  11/11/2021   ID:  [name] May 30, 1945, MRN [id number]  PCP:  Sharilyn Sites, MD   [facility] Providers Cardiologist:  Sherren Mocha, MD     Referring MD: Sharilyn Sites, MD   Chief Complaint: follow-up chest pain  History of Present Illness:    [name] is a very pleasant 76 y.o. male with a hx of HT in 19 N, CAD s/p CABG (LIMA-LAD, RIMA-RCA) in 1997 with subsequent PCIs, HLD, aortic stenosis, OSA on CPAP, hypothyroidism  Underwent CABG x2 in 1997 (LIMA-LAD and RIMA to RCA). Underwent PCI after abnormal nuclear stress test 2017, treated with overlapping drug-eluting stent in the distal RCA through the graft to the RCA.  Most recent heart catheterization 2018 showed patent grafts and stents to distal RCA.  Last cardiology clinic visit was 10/06/2021 with Finis Bud, NP for 1 year follow-up and preoperative evaluation for upper endoscopy.  He reported chronic chest pain with exercise for years. He reported "it feels like I am going to have a heart attack."  Rates pain 8/9 on 0-10 scale. Located in the middle of his chest, does not radiate and has remained the same for many years. Has taken SL NTG on occasion with no improvement.  He was advised to undergo echo and nuclear stress test as well as to update lab work prior to being cleared for procedure.   Echo 10/09/2021 revealed normal LVEF 65 to 70%, no RWMA, moderate asymmetric LVH, indeterminate diastolic parameters, normal RV, mildly dilated left and right atria, mild AS.  NST 10/12/2021 revealed findings consistent with prior MI with peri-infarct ischemia.  Small inferior lateral scar with mild peri-infarct ischemia, intermediate risk, LV function is normal. Plan to proceed with EGD and have soon follow-up to discuss cardiac symptoms in regards to test results.   Today, he is here alone for follow-up. Has sharp right flank pain that occurs when he changes position in the bed.  Chest pain previously described to Finis Bud, NP as noted above has resolved with increase of Imdur. He is feeling well and is walking for exercise and goes dancing 1 x per week. BP elevated at home but he has been checking it prior to medications. He denies chest pain, shortness of breath, lower extremity edema, fatigue, palpitations, melena, hematuria, hemoptysis, diaphoresis, weakness, presyncope, syncope, orthopnea, and PND. Avoids high sodium, processed foods. Has not gotten test results from EGD but assumes everything looked good.   Past Medical History:  Diagnosis Date   Anginal pain ([place])    Arthritis    "all over"   Bladder tumor    Chronic lower back pain    Coronary artery disease    a. s/p CABG x 2 (LIMA->LAD, RIMA->RCA);  b. s/p multiple PCI's to Ramus;  c. 08/2011 Cath/PCI: LM 70% into ramus with 70-80% there->treated wtih 3.5x18 Xience Xpedition DES, LCX  nonobs, RCA occluded.  RIMA & LIMA patent, EF 55-65%   DJD (degenerative joint disease)    GERD (gastroesophageal reflux disease)    History of gout    Hyperlipidemia    Hypertension    Hypothyroidism    Neuropathy    OSA on CPAP    Prostate cancer [facility])     Past Surgical History:  Procedure Laterality Date   ABDOMINAL HERNIA REPAIR     BIOPSY  11/26/2015   Procedure: BIOPSY;  Surgeon: Rogene Houston, MD;  [facility];  Service: Endoscopy;;  gastric esophagus   BIOPSY  01/20/2021   Procedure: BIOPSY;  Surgeon: Harvel Quale, MD;  [facility];  Service: Gastroenterology;;   BIOPSY  10/28/2021   Procedure: BIOPSY;  Surgeon: Harvel Quale, MD;  [facility];  Service: Gastroenterology;;   BLADDER TUMOR EXCISION     BROW LIFT Bilateral 04/27/2019   Procedure: BILATERAL BLEPHAROPLASTY;  Surgeon: Baruch Goldmann, MD;  Location: [facility];  Service: Ophthalmology;  Laterality: Bilateral;   CARDIAC CATHETERIZATION N/A 04/10/2015   Procedure: Left Heart Cath and  Cors/Grafts Angiography;  Surgeon: Sherren Mocha, MD;  Location: [place] CV LAB;  Service: Cardiovascular;  Laterality: N/A;   CARDIAC CATHETERIZATION N/A 04/10/2015   Procedure: Coronary Stent Intervention;  Surgeon: Sherren Mocha, MD;  Location: [place] CV LAB;  Service: Cardiovascular;  Laterality: N/A;   CATARACT EXTRACTION W/PHACO Right 06/23/2015   Procedure: CATARACT EXTRACTION PHACO AND INTRAOCULAR LENS PLACEMENT RIGHT EYE; CDE:  7.08;  Surgeon: Tonny Branch, MD;  Location: [facility];  Service: Ophthalmology;  Laterality: Right;   CATARACT EXTRACTION W/PHACO Left 08/25/2015   Procedure: CATARACT EXTRACTION PHACO AND INTRAOCULAR LENS PLACEMENT LEFT EYE; CDE:  8.18;  Surgeon: Tonny Branch, MD;  Location: [facility];  Service: Ophthalmology;  Laterality: Left;   COLONOSCOPY N/A 12/28/2012   Procedure: COLONOSCOPY;  Surgeon: Rogene Houston, MD;  [facility];  Service: Endoscopy;  Laterality: N/A;  830 rescheduled   COLONOSCOPY WITH PROPOFOL N/A 01/20/2021   Procedure: COLONOSCOPY WITH PROPOFOL;  Surgeon: Harvel Quale, MD;  [facility];  Service: Gastroenterology;  Laterality: N/A;  11:05   CORONARY ANGIOPLASTY WITH STENT PLACEMENT  2013; 04/10/2015   "this makes me a total of 7" (04/10/2015)   CORONARY ARTERY BYPASS GRAFT  1997   with (LIMA)   ESOPHAGEAL DILATION N/A 11/26/2015   Procedure: ESOPHAGEAL DILATION;  Surgeon: Rogene Houston, MD;  [facility];  Service: Endoscopy;  Laterality: N/A;   ESOPHAGEAL DILATION N/A 05/23/2017   Procedure: ESOPHAGEAL DILATION;  Surgeon: Rogene Houston, MD;  [facility];  Service: Endoscopy;  Laterality: N/A;   ESOPHAGEAL DILATION N/A 06/16/2017   Procedure: ESOPHAGEAL DILATION;  Surgeon: Rogene Houston, MD;  [facility];  Service: Endoscopy;  Laterality: N/A;   ESOPHAGEAL DILATION  10/28/2021   Procedure: ESOPHAGEAL DILATION;  Surgeon: Harvel Quale, MD;  [facility];  Service: Gastroenterology;;   ESOPHAGOGASTRODUODENOSCOPY N/A 11/26/2015   Procedure: ESOPHAGOGASTRODUODENOSCOPY (EGD);  Surgeon: Rogene Houston, MD;  [facility];  Service: Endoscopy;  Laterality: N/A;  1:25   ESOPHAGOGASTRODUODENOSCOPY N/A 05/23/2017   Procedure: ESOPHAGOGASTRODUODENOSCOPY (EGD);  Surgeon: Rogene Houston, MD;  [facility];  Service: Endoscopy;  Laterality: N/A;  1:15   ESOPHAGOGASTRODUODENOSCOPY N/A 06/16/2017   Procedure: ESOPHAGOGASTRODUODENOSCOPY (EGD);  Surgeon: Rogene Houston, MD;  [facility];  Service: Endoscopy;  Laterality: N/A;  1240   ESOPHAGOGASTRODUODENOSCOPY (EGD) WITH PROPOFOL N/A 01/20/2021   Procedure: ESOPHAGOGASTRODUODENOSCOPY (EGD) WITH PROPOFOL;  Surgeon: Harvel Quale, MD;  [facility];  Service: Gastroenterology;  Laterality: N/A;   ESOPHAGOGASTRODUODENOSCOPY (EGD) WITH PROPOFOL N/A 10/28/2021   Procedure: ESOPHAGOGASTRODUODENOSCOPY (EGD) WITH PROPOFOL;  Surgeon: Harvel Quale, MD;  [facility];  Service: Gastroenterology;  Laterality: N/A;  215 ASA 2, pt will arrive at 10:45   HERNIA REPAIR Left    KNEE CARTILAGE SURGERY Bilateral    LEFT HEART CATH AND CORS/GRAFTS  ANGIOGRAPHY N/A 09/08/2016   Procedure: LEFT HEART CATH AND CORS/GRAFTS ANGIOGRAPHY;  Surgeon: Martinique, Peter M, MD;  Location: [place] CV LAB;  Service: Cardiovascular;  Laterality: N/A;   LEFT HEART CATHETERIZATION WITH CORONARY ANGIOGRAM N/A 09/08/2011   Procedure: LEFT HEART CATHETERIZATION WITH CORONARY ANGIOGRAM;  Surgeon: Sherren Mocha, MD;  Location: [facility] CATH LAB;  Service: Cardiovascular;  Laterality: N/A;   PERCUTANEOUS CORONARY STENT INTERVENTION (PCI-S) Right 09/08/2011   Procedure: PERCUTANEOUS CORONARY STENT INTERVENTION (PCI-S);  Surgeon: Sherren Mocha, MD;  Location: [facility] CATH LAB;  Service: Cardiovascular;  Laterality: Right;   POLYPECTOMY  01/20/2021   Procedure: POLYPECTOMY;  Surgeon:  Montez Morita, Quillian Quince, MD;  [facility];  Service: Gastroenterology;;   ROBOT ASSISTED LAPAROSCOPIC RADICAL PROSTATECTOMY      Current Medications: Current Meds  Medication Sig   amLODipine (NORVASC) 10 MG tablet Take 10 mg by mouth in the morning.   aspirin EC 81 MG tablet Take 81 mg by mouth every evening.   atorvastatin (LIPITOR) 40 MG tablet Take 20 mg by mouth every evening.   B Complex-C (B-COMPLEX WITH VITAMIN C) tablet Take 1 tablet by mouth every evening.   BLACK CURRANT SEED OIL PO Take 1 each by mouth 2 (two) times a week. 1 teaspoonful twice weekly   carvedilol (COREG) 3.125 MG tablet Take 1 tablet (3.125 mg total) by mouth 2 (two) times daily.   celecoxib (CELEBREX) 200 MG capsule Take 200 mg by mouth in the morning.   cetirizine (ZYRTEC) 10 MG tablet Take 10 mg by mouth every evening.   cholecalciferol (VITAMIN D3) 25 MCG (1000 UNIT) tablet Take 1,000 Units by mouth every evening.   clopidogrel (PLAVIX) 75 MG tablet Take 1 tablet (75 mg total) by mouth in the morning.   Coenzyme Q10 300 MG CAPS Take 300 mg by mouth every evening.   cyanocobalamin (,VITAMIN B-12,) 1000 MCG/ML injection Inject 1,000 mcg into the muscle See admin instructions. Inject (1000 mcg) intramuscularly once monthly as needed for B-12 deficiency.   fenofibrate 160 MG tablet Take 1 tablet (160 mg total) by mouth daily. (Patient taking differently: Take 160 mg by mouth every evening.)   ferrous sulfate (FERROUSUL) 325 (65 FE) MG tablet Take 1 tablet (325 mg total) by mouth 2 (two) times daily with a meal.   gabapentin (NEURONTIN) 600 MG tablet Take 600 mg by mouth 3 (three) times daily.   GLUCOSAMINE-CHONDROITIN PO Take 1 tablet by mouth every evening.   isosorbide mononitrate (IMDUR) 60 MG 24 hr tablet Take 1.5 tablets (90 mg total) by mouth daily. (Patient taking differently: Take 90 mg by mouth every evening.)   levothyroxine (SYNTHROID, LEVOTHROID) 150 MCG tablet Take 150 mcg by mouth daily  before breakfast.   meclizine (ANTIVERT) 25 MG tablet Take 1 tablet (25 mg total) by mouth 2 (two) times daily as needed for dizziness. (Patient taking differently: Take 12.5 mg by mouth at bedtime as needed for dizziness.)   nitroGLYCERIN (NITROSTAT) 0.4 MG SL tablet Place 1 tablet (0.4 mg total) under the tongue every 5 (five) minutes as needed for chest pain.   Omega-3 Fatty Acids (FISH OIL) 1000 MG CAPS Take 1 capsule by mouth in the morning.   Polyethyl Glycol-Propyl Glycol (LUBRICANT EYE DROPS) 0.4-0.3 % SOLN Place 1-2 drops into both eyes 3 (three) times daily as needed (dry/irritated eyes.).   Potassium 99 MG TABS Take 99 mg by mouth in the morning and at bedtime.   Probiotic Product (PROBIOTIC PO) Take 1  capsule by mouth every evening.   psyllium (METAMUCIL) 58.6 % powder Take 1 packet by mouth 2 (two) times a week.   traMADol (ULTRAM) 50 MG tablet Take 50 mg by mouth 3 (three) times daily.     Allergies:   Codeine and Pollen extract   Social History   Socioeconomic History   Marital status: Widowed    Spouse name: Not on file   Number of children: Not on file   Years of education: Not on file   Highest education level: Not on file  Occupational History   Occupation: semi-retired  Tobacco Use   Smoking status: Former    Types: Cigars    Passive exposure: Past   Smokeless tobacco: Never   Tobacco comments:    "quit smoking in ~ 1978"  Vaping Use   Vaping Use: Never used  Substance and Sexual Activity   Alcohol use: Yes    Comment: occasionally    Drug use: No   Sexual activity: Not Currently  Other Topics Concern   Not on file  Social History Narrative   Not on file   Social Determinants of Health   Financial Resource Strain: Low Risk  (09/16/2021)   Overall Financial Resource Strain (CARDIA)    Difficulty of Paying Living Expenses: Not hard at all  Food Insecurity: Not on file  Transportation Needs: No Transportation Needs (09/16/2021)   PRAPARE - Armed forces logistics/support/administrative officer (Medical): No    Lack of Transportation (Non-Medical): No  Physical Activity: Not on file  Stress: Not on file  Social Connections: Not on file     Family History: The patient's family history includes Cancer (age of onset: 25) in his father; Stroke (age of onset: 54) in his mother.  ROS:   Please see the history of present illness.  All other systems reviewed and are negative.  Labs/Other Studies Reviewed:    The following studies were reviewed today:  Nuclear stress test 10/12/21    Findings are consistent with prior myocardial infarction with peri-infarct ischemia. The study is intermediate risk.   No ST deviation was noted.   Left ventricular function is normal. End diastolic cavity size is normal.   Small, Mild fixed defect in the inferolateral wall that is mildy worse with stress. C/f small inferolateral scar with mild per-infarct ischemia  Echo 10/09/21  1. Left ventricular ejection fraction, by estimation, is 65 to 70%. The  left ventricle has normal function. The left ventricle has no regional  wall motion abnormalities. There is moderate asymmetric left ventricular  hypertrophy. Left ventricular  diastolic parameters are indeterminate.   2. Right ventricular systolic function is normal. The right ventricular  size is normal. Tricuspid regurgitation signal is inadequate for assessing  PA pressure.   3. Left atrial size was mildly dilated.   4. Right atrial size was mildly dilated.   5. The mitral valve is grossly normal. No evidence of mitral valve  regurgitation. No evidence of mitral stenosis. Moderate mitral annular  calcification.   6. The aortic valve is tricuspid. There is mild calcification of the  aortic valve. There is mild thickening of the aortic valve. Aortic valve  regurgitation is not visualized. Mild aortic valve stenosis. Normal stoke  volume index.   7. The inferior vena cava is normal in size with <50% respiratory   variability, suggesting right atrial pressure of 8 mmHg.   Comparison(s): Unable to view 2013 study.   LHC 09/08/16  Mid RCA  lesion, 100 %stenosed. RIMA. Dist RCA lesion, 0 %stenosed. A drug eluting . Ost RPDA lesion, 0 %stenosed. A drug eluting . Prox LAD lesion, 100 %stenosed. LIMA. Ost LM to LM lesion, 25 %stenosed. Ost Ramus to Ramus lesion, 25 %stenosed. The left ventricular systolic function is normal. LV end diastolic pressure is mildly elevated. The left ventricular ejection fraction is 55-65% by visual estimate.   1. 2 vessel occlusive CAD 2. Patent stents in the left main and large ramus intermediate branch 3. Patent stents in the distal RCA 4. Patent free RIMA to the distal RCA 5. Patent LIMA to the LAD 6. Normal LV function 7. Mildly elevated LVEDP.   Diagnostic Dominance: Right      Recent Labs: 10/06/2021: ALT 19; BUN 19; Creatinine, Ser 1.30; Hemoglobin 12.1; Magnesium 2.2; Platelets 176; Potassium 4.2; Sodium 143; TSH 0.375  Recent Lipid Panel    Component Value Date/Time   CHOL 121 10/06/2021 1542   TRIG 226 (H) 10/06/2021 1542   HDL 36 (L) 10/06/2021 1542   CHOLHDL 3.4 10/06/2021 1542   LDLCALC 49 10/06/2021 1542     Risk Assessment/Calculations:      Physical Exam:    VS:  BP 138/62   Pulse (!) 50   Ht 6' (1.829 m)   Wt 220 lb 3.2 oz (99.9 kg)   SpO2 97%   BMI 29.86 kg/m     Wt Readings from Last 3 Encounters:  11/11/21 220 lb 3.2 oz (99.9 kg)  10/28/21 216 lb 12.8 oz (98.3 kg)  10/23/21 216 lb 12.8 oz (98.3 kg)     GEN:  Well nourished, well developed in no acute distress HEENT: Normal NECK: No JVD; No carotid bruits CARDIAC: RRR, no murmurs, rubs, gallops RESPIRATORY:  Clear to auscultation without rales, wheezing or rhonchi  ABDOMEN: Soft, non-tender, non-distended MUSCULOSKELETAL:  No edema; No deformity. 2+ pedal pulses, equal bilaterally SKIN: Warm and dry NEUROLOGIC:  Alert and oriented x 3 PSYCHIATRIC:  Normal  affect   EKG:  EKG is not ordered today.    Diagnoses:    1. Coronary artery disease involving native coronary artery of native heart without angina pectoris   2. Hyperlipidemia LDL goal <70   3. Essential hypertension   4. Abnormal TSH   5. Nonrheumatic aortic valve stenosis   6. Hypertriglyceridemia   7. Elevated serum creatinine    Assessment and Plan:     CAD without angina: CABG x2 in 1997 (LIMA-LAD and RIMA to RCA), PCI after abnormal nuclear stress test 2017, treated with overlapping DES in the distal RCA through the graft to the RCA.  Most recent heart catheterization 2018 showed patent grafts and stents to distal RCA. Chest pain described at previous office visit 9/26 has resolved on higher dose Imdur.  He is feeling well and has returned resumed exercise with no symptoms of angina.  Continue GDMT including amlodipine, aspirin, atorvastatin, carvedilol, clopidogrel, isosorbide.   Hypertension: SBP mildly elevated today. Moderate asymmetric LVH on echo 10/09/2021.  I have advised him to monitor BP on a consistent basis at home and report back to [place] if consistently > 140/80. No medication changes today.  Elevated creatinine: Creatinine 1.3 on 10/06/2021.  He was advised to increase water intake.  We are rechecking today. Not currently taking ACEi/ARB/ARNI/MRA.   Hyperlipidemia LDL goal < 70/Hypertriglyceridemia: LDL 49 on 10/06/2021, at goal. Triglycerides elevated at 226, fenofibrate was started. Continue atorvastatin and fenofibrate.  Aortic stenosis: Mild aortic stenosis with peak gradient 20.2 mmHg,  AVA 3.47 cm on echo 10/09/21.  He is asymptomatic. We will continue to monitor routinely based on clinical indications.   Abnormal TSH: TSH was low on 10/06/2021.  He takes levothyroxine for hypothyroid, managed by PCP. He was advised to have repeat lab work today, getting TSH reflex on abnormal to free T4.      Disposition: 6 months with Dr. Burt Knack  Medication Adjustments/Labs and  Tests Ordered: Current medicines are reviewed at length with the patient today.  Concerns regarding medicines are outlined above.  No orders of the defined types were placed in this encounter.  No orders of the defined types were placed in this encounter.   Patient Instructions  Medication Instructions:   Your physician recommends that you continue on your current medications as directed. Please refer to the Current Medication list given to you today.   *If you need a refill on your cardiac medications before your next appointment, please call your pharmacy*   Lab Work:  TODAY!!!!! TSH ON RFX ON ABNORMAL TO FREE T4/BMET!!  If you have labs (blood work) drawn today and your tests are completely normal, you will receive your results only by: [place] (if you have MyChart) OR A paper copy in the mail If you have any lab test that is abnormal or we need to change your treatment, we will call you to review the results.   Testing/Procedures:  None ordered.   Follow-Up: At [facility], you and your health needs are our priority.  As part of our continuing mission to provide you with exceptional heart care, we have created designated Provider Care Teams.  These Care Teams include your primary Cardiologist (physician) and Advanced Practice Providers (APPs -  Physician Assistants and Nurse Practitioners) who all work together to provide you with the care you need, when you need it.  We recommend signing up for the patient portal called "MyChart".  Sign up information is provided on this After Visit Summary.  MyChart is used to connect with patients for Virtual Visits (Telemedicine).  Patients are able to view lab/test results, encounter notes, upcoming appointments, etc.  Non-urgent messages can be sent to your provider as well.   To learn more about what you can do with MyChart, go to [email or web address].    Your next appointment:   6 month(s)  The format for your  next appointment:   In Person  Provider:   Sherren Mocha, MD     Important Information About Sugar         Signed, Emmaline Life, NP  11/11/2021 5:20 PM    [place]

## 2021-11-11 ENCOUNTER — Encounter: Payer: Self-pay | Admitting: Nurse Practitioner

## 2021-11-11 ENCOUNTER — Ambulatory Visit: Payer: Medicare Other | Attending: Physician Assistant | Admitting: Nurse Practitioner

## 2021-11-11 ENCOUNTER — Other Ambulatory Visit: Payer: Self-pay | Admitting: *Deleted

## 2021-11-11 VITALS — BP 138/62 | HR 50 | Ht 72.0 in | Wt 220.2 lb

## 2021-11-11 DIAGNOSIS — I2510 Atherosclerotic heart disease of native coronary artery without angina pectoris: Secondary | ICD-10-CM | POA: Diagnosis not present

## 2021-11-11 DIAGNOSIS — R7989 Other specified abnormal findings of blood chemistry: Secondary | ICD-10-CM

## 2021-11-11 DIAGNOSIS — E785 Hyperlipidemia, unspecified: Secondary | ICD-10-CM

## 2021-11-11 DIAGNOSIS — E781 Pure hyperglyceridemia: Secondary | ICD-10-CM | POA: Diagnosis not present

## 2021-11-11 DIAGNOSIS — I10 Essential (primary) hypertension: Secondary | ICD-10-CM

## 2021-11-11 DIAGNOSIS — I350 Nonrheumatic aortic (valve) stenosis: Secondary | ICD-10-CM | POA: Diagnosis not present

## 2021-11-11 NOTE — Patient Instructions (Signed)
Medication Instructions:   Your physician recommends that you continue on your current medications as directed. Please refer to the Current Medication list given to you today.   *If you need a refill on your cardiac medications before your next appointment, please call your pharmacy*   Lab Work:  TODAY!!!!! TSH ON RFX ON ABNORMAL TO FREE T4/BMET!!  If you have labs (blood work) drawn today and your tests are completely normal, you will receive your results only by: [place] (if you have MyChart) OR A paper copy in the mail If you have any lab test that is abnormal or we need to change your treatment, we will call you to review the results.   Testing/Procedures:  None ordered.   Follow-Up: At [facility], you and your health needs are our priority.  As part of our continuing mission to provide you with exceptional heart care, we have created designated Provider Care Teams.  These Care Teams include your primary Cardiologist (physician) and Advanced Practice Providers (APPs -  Physician Assistants and Nurse Practitioners) who all work together to provide you with the care you need, when you need it.  We recommend signing up for the patient portal called "MyChart".  Sign up information is provided on this After Visit Summary.  MyChart is used to connect with patients for Virtual Visits (Telemedicine).  Patients are able to view lab/test results, encounter notes, upcoming appointments, etc.  Non-urgent messages can be sent to your provider as well.   To learn more about what you can do with MyChart, go to [email or web address].    Your next appointment:   6 month(s)  The format for your next appointment:   In Person  Provider:   Sherren Mocha, MD     Important Information About Sugar

## 2021-11-12 LAB — TSH RFX ON ABNORMAL TO FREE T4: TSH: 1.39 u[IU]/mL (ref 0.450–4.500)

## 2021-11-12 LAB — BASIC METABOLIC PANEL
BUN/Creatinine Ratio: 12 (ref 10–24)
BUN: 19 mg/dL (ref 8–27)
CO2: 27 mmol/L (ref 20–29)
Calcium: 9.1 mg/dL (ref 8.6–10.2)
Chloride: 103 mmol/L (ref 96–106)
Creatinine, Ser: 1.57 mg/dL — ABNORMAL HIGH (ref 0.76–1.27)
Glucose: 108 mg/dL — ABNORMAL HIGH (ref 70–99)
Potassium: 4.4 mmol/L (ref 3.5–5.2)
Sodium: 145 mmol/L — ABNORMAL HIGH (ref 134–144)
eGFR: 45 mL/min/{1.73_m2} — ABNORMAL LOW (ref >59)

## 2021-11-26 ENCOUNTER — Telehealth: Payer: Self-pay | Admitting: Nurse Practitioner

## 2021-11-26 NOTE — Telephone Encounter (Signed)
Called and spoke with patient and discussed his kidney function.  Went over his recent lab work results.  Stated he does not have chronic kidney disease, but instead is showing signs of acute kidney injury.  Etiology unclear, but seems to be most likely related to history of high blood pressure. Says he is staying well hydrated. He went over his most recent BP readings with me.  SBP is averaging around 145.  I discussed with him that I would like his SBP to be less than 140.  I have let CMA know to increase carvedilol to 6.25 mg twice daily from 3.125 mg twice daily.  He will continue to log his BP and let me know if he has any abnormal, low readings or any signs or symptoms of low blood pressure.  I discussed with patient that we will evaluate what next kidney function shows.     If kidney function continues to decline within the next month or 2, plan to arrange nephrology referral.   Pt verbalized understanding of our conversation and was appreciative of my call.   Finis Bud, NP

## 2021-11-30 ENCOUNTER — Other Ambulatory Visit: Payer: Self-pay

## 2021-11-30 MED ORDER — CARVEDILOL 6.25 MG PO TABS: 6.2500 mg | ORAL_TABLET | Freq: Two times a day (BID) | ORAL | 3 refills | Status: DC

## 2021-12-14 ENCOUNTER — Encounter (INDEPENDENT_AMBULATORY_CARE_PROVIDER_SITE_OTHER): Payer: Self-pay | Admitting: Gastroenterology

## 2021-12-14 ENCOUNTER — Ambulatory Visit (INDEPENDENT_AMBULATORY_CARE_PROVIDER_SITE_OTHER): Payer: Medicare Other | Admitting: Gastroenterology

## 2021-12-14 VITALS — BP 107/60 | HR 62 | Temp 97.3°F | Ht 72.0 in | Wt 219.0 lb

## 2021-12-14 DIAGNOSIS — K2940 Chronic atrophic gastritis without bleeding: Secondary | ICD-10-CM | POA: Insufficient documentation

## 2021-12-14 DIAGNOSIS — R140 Abdominal distension (gaseous): Secondary | ICD-10-CM | POA: Insufficient documentation

## 2021-12-14 DIAGNOSIS — D510 Vitamin B12 deficiency anemia due to intrinsic factor deficiency: Secondary | ICD-10-CM | POA: Insufficient documentation

## 2021-12-14 DIAGNOSIS — D500 Iron deficiency anemia secondary to blood loss (chronic): Secondary | ICD-10-CM

## 2021-12-14 DIAGNOSIS — D509 Iron deficiency anemia, unspecified: Secondary | ICD-10-CM | POA: Insufficient documentation

## 2021-12-14 NOTE — Progress Notes (Signed)
[name], M.D. Gastroenterology & Hepatology [facility] [address] [place], [place] [address]  Primary Care Physician: Sharilyn Sites, MD [address] [place] [address]  I will communicate my assessment and recommendations to the referring MD via EMR.  Problems: Autoimmune gastritis Mild combined pernicious/iron deficiency anemia Dysphagia, resolved Bloating  History of Present Illness: [name] is a 76 y.o. male with past medical history of coronary artery disease status post CABG, hypertension, hypothyroidism, hyperlipidemia, neuropathy, OSA, prostate cancer, gout, bladder cancer, who presents for follow up of autoimmune gastritis with anemia and bloating.  The patient was last seen on 09/22/2021. At that time, the patient was presenting persistent dysphagia for which he was scheduled for an EGD.  EGD was performed on 10/28/2021 with finding described below. Dysphagia resolved after dilation  Patient reports having recurrent episodes of bloating and retaining a fair amount of gas in his abdomen.  He reportedly has been a chronic issue. He does not have any pain but reports having significant tightness in his abdomen after having a meal. The patient denies having any dysphagia, nausea, vomiting, fever, chills, hematochezia, melena, hematemesis, diarrhea, jaundice, pruritus or weight loss.  He is getting B12 shots by his PCP .  He is also currently taking oral iron twice a day, which initially caused his stool to become very dark.  Patient reports that he was advised recently to stop taking Celebrex and tramadol as he was told this could worsen his gastric inflammation. States tramadol can cause some unsteadiness and "vertigo sensation", so he is wondering if he can cut down on his dose.  Last EGD: 10/28/2021 - No endoscopic esophageal abnormality to explain patient's dysphagia. Esophagus dilated. Dilated. -  Z-line irregular, 41 cm from the incisors. - Multiple gastric polyps. Resected and retrieved. - Gastric mucosal atrophy. Biopsied. - Normal examined duodenum.  A. ANTRUM, BIOPSY:  Reactive gastropathy with mild chronic gastritis  Helicobacter stain negative ([facility], adequate control)  Negative for intestinal metaplasia, dysplasia and carcinoma   B. STOMACH, BODY, BIOPSY:  Antral type gastric mucosa showing moderate chronic gastritis with  intestinal metaplasia  Helicobacter stain negative (IHC, adequate control)  Negative for dysplasia and carcinoma   C. STOMACH, POLYPECTOMY:  Moderate chronic gastritis with lymphoid aggregates and foveolar  hyperplasia compatible with hyperplastic polyp  Intestinal metaplasia  Helicobacter stain negative (IHC, adequate control)  Negative for dysplasia and carcinoma   Recommended repeat Egd in 3 years  Last Colonoscopy:01/2021 One 12 mm polyp in the proximal ascending colon, removed with a hot snare. Resected and retrieved. Clip was placed. - One 8 mm polyp in the proximal ascending colon - Two 2 mm polyps in the ascending colon - Diverticulosis in the sigmoid colon, in the descending colon and in the ascending colon. - Non-bleeding internal hemorrhoids. (2 tubular adenomas, 1 sessile serrated lesion)   Repeat colonoscopy in 3 years.  Past Medical History: Past Medical History:  Diagnosis Date   Anginal pain ([place])    Arthritis    "all over"   Bladder tumor    Chronic lower back pain    Coronary artery disease    a. s/p CABG x 2 (LIMA->LAD, RIMA->RCA);  b. s/p multiple PCI's to Ramus;  c. 08/2011 Cath/PCI: LM 70% into ramus with 70-80% there->treated wtih 3.5x18 Xience Xpedition DES, LCX  nonobs, RCA occluded.  RIMA & LIMA patent, EF 55-65%   DJD (degenerative joint disease)    GERD (gastroesophageal reflux disease)  History of gout    Hyperlipidemia    Hypertension    Hypothyroidism    Neuropathy    OSA on CPAP    Prostate cancer  [facility])     Past Surgical History: Past Surgical History:  Procedure Laterality Date   ABDOMINAL HERNIA REPAIR     BIOPSY  11/26/2015   Procedure: BIOPSY;  Surgeon: Rogene Houston, MD;  [facility];  Service: Endoscopy;;  gastric esophagus   BIOPSY  01/20/2021   Procedure: BIOPSY;  Surgeon: Harvel Quale, MD;  [facility];  Service: Gastroenterology;;   BIOPSY  10/28/2021   Procedure: BIOPSY;  Surgeon: Harvel Quale, MD;  [facility];  Service: Gastroenterology;;   BLADDER TUMOR EXCISION     BROW LIFT Bilateral 04/27/2019   Procedure: BILATERAL BLEPHAROPLASTY;  Surgeon: Baruch Goldmann, MD;  [facility]: [facility];  Service: Ophthalmology;  Laterality: Bilateral;   CARDIAC CATHETERIZATION N/A 04/10/2015   Procedure: Left Heart Cath and Cors/Grafts Angiography;  Surgeon: Sherren Mocha, MD;  Location: [place] [facility];  Service: Cardiovascular;  Laterality: N/A;   CARDIAC CATHETERIZATION N/A 04/10/2015   Procedure: Coronary Stent Intervention;  Surgeon: Sherren Mocha, MD;  Location: [place] [facility];  Service: Cardiovascular;  Laterality: N/A;   CATARACT EXTRACTION W/PHACO Right 06/23/2015   Procedure: CATARACT EXTRACTION PHACO AND INTRAOCULAR LENS PLACEMENT RIGHT EYE; CDE:  7.08;  Surgeon: Tonny Branch, MD;  [facility]: [facility];  Service: Ophthalmology;  Laterality: Right;   CATARACT EXTRACTION W/PHACO Left 08/25/2015   Procedure: CATARACT EXTRACTION PHACO AND INTRAOCULAR LENS PLACEMENT LEFT EYE; CDE:  8.18;  Surgeon: Tonny Branch, MD;  [facility]: [facility];  Service: Ophthalmology;  Laterality: Left;   COLONOSCOPY N/A 12/28/2012   Procedure: COLONOSCOPY;  Surgeon: Rogene Houston, MD;  [facility];  Service: Endoscopy;  Laterality: N/A;  830 rescheduled   COLONOSCOPY WITH PROPOFOL N/A 01/20/2021   Procedure: COLONOSCOPY WITH PROPOFOL;  Surgeon: Harvel Quale, MD;  [facility];  Service: Gastroenterology;   Laterality: N/A;  11:05   CORONARY ANGIOPLASTY WITH STENT PLACEMENT  2013; 04/10/2015   "this makes me a total of 7" (04/10/2015)   CORONARY ARTERY BYPASS GRAFT  1997   with (LIMA)   ESOPHAGEAL DILATION N/A 11/26/2015   Procedure: ESOPHAGEAL DILATION;  Surgeon: Rogene Houston, MD;  [facility];  Service: Endoscopy;  Laterality: N/A;   ESOPHAGEAL DILATION N/A 05/23/2017   Procedure: ESOPHAGEAL DILATION;  Surgeon: Rogene Houston, MD;  [facility];  Service: Endoscopy;  Laterality: N/A;   ESOPHAGEAL DILATION N/A 06/16/2017   Procedure: ESOPHAGEAL DILATION;  Surgeon: Rogene Houston, MD;  [facility];  Service: Endoscopy;  Laterality: N/A;   ESOPHAGEAL DILATION  10/28/2021   Procedure: ESOPHAGEAL DILATION;  Surgeon: Harvel Quale, MD;  [facility];  Service: Gastroenterology;;   ESOPHAGOGASTRODUODENOSCOPY N/A 11/26/2015   Procedure: ESOPHAGOGASTRODUODENOSCOPY (EGD);  Surgeon: Rogene Houston, MD;  [facility];  Service: Endoscopy;  Laterality: N/A;  1:25   ESOPHAGOGASTRODUODENOSCOPY N/A 05/23/2017   Procedure: ESOPHAGOGASTRODUODENOSCOPY (EGD);  Surgeon: Rogene Houston, MD;  [facility];  Service: Endoscopy;  Laterality: N/A;  1:15   ESOPHAGOGASTRODUODENOSCOPY N/A 06/16/2017   Procedure: ESOPHAGOGASTRODUODENOSCOPY (EGD);  Surgeon: Rogene Houston, MD;  [facility];  Service: Endoscopy;  Laterality: N/A;  1240   ESOPHAGOGASTRODUODENOSCOPY (EGD) WITH PROPOFOL N/A 01/20/2021   Procedure: ESOPHAGOGASTRODUODENOSCOPY (EGD) WITH PROPOFOL;  Surgeon:  Harvel Quale, MD;  [facility];  Service: Gastroenterology;  Laterality: N/A;   ESOPHAGOGASTRODUODENOSCOPY (EGD) WITH PROPOFOL N/A 10/28/2021   Procedure: ESOPHAGOGASTRODUODENOSCOPY (EGD) WITH PROPOFOL;  Surgeon: Harvel Quale, MD;  [facility];  Service: Gastroenterology;  Laterality: N/A;  215 ASA 2, pt will arrive at  10:45   HERNIA REPAIR Left    KNEE CARTILAGE SURGERY Bilateral    LEFT HEART CATH AND CORS/GRAFTS ANGIOGRAPHY N/A 09/08/2016   Procedure: LEFT HEART CATH AND CORS/GRAFTS ANGIOGRAPHY;  Surgeon: Martinique, Peter M, MD;  Location: [place] [facility];  Service: Cardiovascular;  Laterality: N/A;   LEFT HEART CATHETERIZATION WITH CORONARY ANGIOGRAM N/A 09/08/2011   Procedure: LEFT HEART CATHETERIZATION WITH CORONARY ANGIOGRAM;  Surgeon: Sherren Mocha, MD;  Location: [facility] CATH LAB;  Service: Cardiovascular;  Laterality: N/A;   PERCUTANEOUS CORONARY STENT INTERVENTION (PCI-S) Right 09/08/2011   Procedure: PERCUTANEOUS CORONARY STENT INTERVENTION (PCI-S);  Surgeon: Sherren Mocha, MD;  Location: [facility] CATH LAB;  Service: Cardiovascular;  Laterality: Right;   POLYPECTOMY  01/20/2021   Procedure: POLYPECTOMY;  Surgeon: Harvel Quale, MD;  [facility];  Service: Gastroenterology;;   ROBOT ASSISTED LAPAROSCOPIC RADICAL PROSTATECTOMY      Family History: Family History  Problem Relation Age of Onset   Cancer Father 8       died   Stroke Mother 40       died    Social History: Social History   Tobacco Use  Smoking Status Former   Types: Cigars   Passive exposure: Past  Smokeless Tobacco Never  Tobacco Comments   "quit smoking in ~ 1978"   Social History   Substance and Sexual Activity  Alcohol Use Yes   Comment: occasionally    Social History   Substance and Sexual Activity  Drug Use No    Allergies: Allergies  Allergen Reactions   Codeine Nausea Only   Pollen Extract Other (See Comments)    Seasonal    Medications: Current Outpatient Medications  Medication Sig Dispense Refill   amLODipine (NORVASC) 10 MG tablet Take 10 mg by mouth in the morning.     aspirin EC 81 MG tablet Take 81 mg by mouth every evening. 30 tablet    atorvastatin (LIPITOR) 40 MG tablet Take 20 mg by mouth every evening.     B Complex-C (B-COMPLEX WITH VITAMIN C) tablet Take 1 tablet by  mouth every evening.     BLACK CURRANT SEED OIL PO Take 1 each by mouth 2 (two) times a week. 1 teaspoonful twice weekly     carvedilol (COREG) 6.25 MG tablet Take 1 tablet (6.25 mg total) by mouth 2 (two) times daily. 180 tablet 3   celecoxib (CELEBREX) 200 MG capsule Take 200 mg by mouth in the morning.     cetirizine (ZYRTEC) 10 MG tablet Take 10 mg by mouth every evening.     cholecalciferol (VITAMIN D3) 25 MCG (1000 UNIT) tablet Take 1,000 Units by mouth every evening.     clopidogrel (PLAVIX) 75 MG tablet Take 1 tablet (75 mg total) by mouth in the morning. 90 tablet 3   Coenzyme Q10 300 MG CAPS Take 300 mg by mouth every evening.     cyanocobalamin (,VITAMIN B-12,) 1000 MCG/ML injection Inject 1,000 mcg into the muscle See admin instructions. Inject (1000 mcg) intramuscularly once monthly as needed for B-12 deficiency.  2   fenofibrate 160 MG tablet Take 1 tablet (160 mg total) by mouth daily. (Patient  taking differently: Take 160 mg by mouth every evening.) 30 tablet 11   ferrous sulfate (FERROUSUL) 325 (65 FE) MG tablet Take 1 tablet (325 mg total) by mouth 2 (two) times daily with a meal. 180 tablet 3   gabapentin (NEURONTIN) 600 MG tablet Take 600 mg by mouth 3 (three) times daily.  3   GLUCOSAMINE-CHONDROITIN PO Take 1 tablet by mouth every evening.     isosorbide mononitrate (IMDUR) 60 MG 24 hr tablet Take 1.5 tablets (90 mg total) by mouth daily. (Patient taking differently: Take 90 mg by mouth every evening.) 135 tablet 3   levothyroxine (SYNTHROID, LEVOTHROID) 150 MCG tablet Take 150 mcg by mouth daily before breakfast.     meclizine (ANTIVERT) 25 MG tablet Take 1 tablet (25 mg total) by mouth 2 (two) times daily as needed for dizziness. (Patient taking differently: Take 12.5 mg by mouth at bedtime as needed for dizziness.) 30 tablet 0   nitroGLYCERIN (NITROSTAT) 0.4 MG SL tablet Place 1 tablet (0.4 mg total) under the tongue every 5 (five) minutes as needed for chest pain. 25 tablet  3   Omega-3 Fatty Acids (FISH OIL) 1000 MG CAPS Take 1 capsule by mouth in the morning.     Polyethyl Glycol-Propyl Glycol (LUBRICANT EYE DROPS) 0.4-0.3 % SOLN Place 1-2 drops into both eyes 3 (three) times daily as needed (dry/irritated eyes.).     Potassium 99 MG TABS Take 99 mg by mouth in the morning and at bedtime.     Probiotic Product (PROBIOTIC PO) Take 1 capsule by mouth every evening.     psyllium (METAMUCIL) 58.6 % powder Take 1 packet by mouth 2 (two) times a week.     traMADol (ULTRAM) 50 MG tablet Take 50 mg by mouth 3 (three) times daily.  1   No current facility-administered medications for this visit.    Review of Systems: GENERAL: negative for malaise, night sweats HEENT: No changes in hearing or vision, no nose bleeds or other nasal problems. NECK: Negative for lumps, goiter, pain and significant neck swelling RESPIRATORY: Negative for cough, wheezing CARDIOVASCULAR: Negative for chest pain, leg swelling, palpitations, orthopnea GI: SEE HPI MUSCULOSKELETAL: Negative for joint pain or swelling, back pain, and muscle pain. SKIN: Negative for lesions, rash PSYCH: Negative for sleep disturbance, mood disorder and recent psychosocial stressors. HEMATOLOGY Negative for prolonged bleeding, bruising easily, and swollen nodes. ENDOCRINE: Negative for cold or heat intolerance, polyuria, polydipsia and goiter. NEURO: negative for tremor, gait imbalance, syncope and seizures. The remainder of the review of systems is noncontributory.   Physical Exam: BP 107/60 (BP Location: Left Arm, Patient Position: Sitting, Cuff Size: Large)   Pulse 62   Temp (!) 97.3 F (36.3 C) (Temporal)   Ht 6' (1.829 m)   Wt 219 lb (99.3 kg)   BMI 29.70 kg/m  GENERAL: The patient is AO x3, in no acute distress. HEENT: Head is normocephalic and atraumatic. EOMI are intact. Mouth is well hydrated and without lesions. NECK: Supple. No masses LUNGS: Clear to auscultation. No presence of  rhonchi/wheezing/rales. Adequate chest expansion HEART: RRR, normal s1 and s2. ABDOMEN: Soft, nontender, no guarding, no peritoneal signs, and nondistended. BS +. No masses. EXTREMITIES: Without any cyanosis, clubbing, rash, lesions or edema. NEUROLOGIC: AOx3, no focal motor deficit. SKIN: no jaundice, no rashes  Imaging/Labs: as above  I personally reviewed and interpreted the available labs, imaging and endoscopic files.  Impression and Plan: [name] is a 76 y.o. male with past medical  history of coronary artery disease status post CABG, hypertension, hypothyroidism, hyperlipidemia, neuropathy, OSA, prostate cancer, gout, bladder cancer, who presents for follow up of autoimmune gastritis with anemia and bloating.  The patient has felt well and denies any ongoing complaints besides presence of recurrent bloating without any other associated red flag signs.  I consider this is largely related to certain food intolerances, for which he will benefit from implementing a low FODMAP diet as part of his discomfort may be related to IBS.  If he presents breakthrough episodes of bloating, he can take IBgard as needed to relieve his symptoms.    Regarding his autoimmune gastritis and anemia, he is currently receiving B12 supplementation and oral iron and has tolerated these medications adequately.  Will monitor his labs today but I advised him to continue on oral iron twice a day and B12 IM injections.  I also advised him to avoid Celebrex as this can potentially lead to worsening peptic ulcer disease.  He can try half a dose of his current tramadol dosage to avoid side effects from this medication.  - Patient was counseled about the benefit of implementing a low FODMAP to improve symptoms and recurrent episodes. A dietary list was provided to the patient.  - Start IBGard 1 tablet every 8-12 hours as needed for bloating -Check CBC, CMP, iron panel and B12 - Avoid using Celebrex, can try Tylenol  as needed for pain control - Continue oral FeSu 325 mg twice a day - Continue B12 injections  All questions were answered.      [name], MD Gastroenterology and Hepatology [facility]

## 2021-12-14 NOTE — Patient Instructions (Addendum)
Patient was counseled about the benefit of implementing a low FODMAP to improve symptoms and recurrent episodes. A dietary list was provided to the patient.  Start IBGard 1 tablet every 8-12 hours as needed for bloating Perform blood workup Avoid using Celebrex, can try Tylenol as needed for pain control Continue oral iron twice a day Continue B12 injections

## 2021-12-15 LAB — CBC WITH DIFFERENTIAL/PLATELET
Absolute Monocytes: 593 cells/uL (ref 200–950)
Basophils Absolute: 29 cells/uL (ref 0–200)
Basophils Relative: 0.5 %
Eosinophils Absolute: 143 cells/uL (ref 15–500)
Eosinophils Relative: 2.5 %
HCT: 35.1 % — ABNORMAL LOW (ref 38.5–50.0)
Hemoglobin: 11.9 g/dL — ABNORMAL LOW (ref 13.2–17.1)
Lymphs Abs: 2029 cells/uL (ref 850–3900)
MCH: 33.6 pg — ABNORMAL HIGH (ref 27.0–33.0)
MCHC: 33.9 g/dL (ref 32.0–36.0)
MCV: 99.2 fL (ref 80.0–100.0)
MPV: 11 fL (ref 7.5–12.5)
Monocytes Relative: 10.4 %
Neutro Abs: 2907 cells/uL (ref 1500–7800)
Neutrophils Relative %: 51 %
Platelets: 207 10*3/uL (ref 140–400)
RBC: 3.54 10*6/uL — ABNORMAL LOW (ref 4.20–5.80)
RDW: 11.8 % (ref 11.0–15.0)
Total Lymphocyte: 35.6 %
WBC: 5.7 10*3/uL (ref 3.8–10.8)

## 2021-12-15 LAB — COMPREHENSIVE METABOLIC PANEL
AG Ratio: 1.7 (calc) (ref 1.0–2.5)
ALT: 25 U/L (ref 9–46)
AST: 20 U/L (ref 10–35)
Albumin: 4.3 g/dL (ref 3.6–5.1)
Alkaline phosphatase (APISO): 48 U/L (ref 35–144)
BUN/Creatinine Ratio: 15 (calc) (ref 6–22)
BUN: 26 mg/dL — ABNORMAL HIGH (ref 7–25)
CO2: 29 mmol/L (ref 20–32)
Calcium: 9.3 mg/dL (ref 8.6–10.3)
Chloride: 103 mmol/L (ref 98–110)
Creat: 1.69 mg/dL — ABNORMAL HIGH (ref 0.70–1.28)
Globulin: 2.5 g/dL (calc) (ref 1.9–3.7)
Glucose, Bld: 91 mg/dL (ref 65–99)
Potassium: 4.6 mmol/L (ref 3.5–5.3)
Sodium: 138 mmol/L (ref 135–146)
Total Bilirubin: 0.4 mg/dL (ref 0.2–1.2)
Total Protein: 6.8 g/dL (ref 6.1–8.1)

## 2021-12-15 LAB — IRON,TIBC AND FERRITIN PANEL
%SAT: 36 % (calc) (ref 20–48)
Ferritin: 79 ng/mL (ref 24–380)
Iron: 128 ug/dL (ref 50–180)
TIBC: 360 mcg/dL (calc) (ref 250–425)

## 2021-12-15 LAB — B12 AND FOLATE PANEL
Folate: >24 ng/mL
Vitamin B-12: 263 pg/mL (ref 200–1100)

## 2021-12-18 DIAGNOSIS — C672 Malignant neoplasm of lateral wall of bladder: Secondary | ICD-10-CM | POA: Diagnosis not present

## 2021-12-18 DIAGNOSIS — I2510 Atherosclerotic heart disease of native coronary artery without angina pectoris: Secondary | ICD-10-CM | POA: Diagnosis not present

## 2021-12-18 DIAGNOSIS — E039 Hypothyroidism, unspecified: Secondary | ICD-10-CM | POA: Diagnosis not present

## 2021-12-18 DIAGNOSIS — I10 Essential (primary) hypertension: Secondary | ICD-10-CM | POA: Diagnosis not present

## 2021-12-18 DIAGNOSIS — E785 Hyperlipidemia, unspecified: Secondary | ICD-10-CM | POA: Diagnosis not present

## 2021-12-18 DIAGNOSIS — I350 Nonrheumatic aortic (valve) stenosis: Secondary | ICD-10-CM | POA: Diagnosis not present

## 2021-12-30 DIAGNOSIS — Z7982 Long term (current) use of aspirin: Secondary | ICD-10-CM | POA: Diagnosis not present

## 2021-12-30 DIAGNOSIS — C675 Malignant neoplasm of bladder neck: Secondary | ICD-10-CM | POA: Diagnosis not present

## 2021-12-30 DIAGNOSIS — I2510 Atherosclerotic heart disease of native coronary artery without angina pectoris: Secondary | ICD-10-CM | POA: Diagnosis not present

## 2021-12-30 DIAGNOSIS — C673 Malignant neoplasm of anterior wall of bladder: Secondary | ICD-10-CM | POA: Diagnosis not present

## 2021-12-30 DIAGNOSIS — Z955 Presence of coronary angioplasty implant and graft: Secondary | ICD-10-CM | POA: Diagnosis not present

## 2021-12-30 DIAGNOSIS — Z951 Presence of aortocoronary bypass graft: Secondary | ICD-10-CM | POA: Diagnosis not present

## 2021-12-30 DIAGNOSIS — Z79899 Other long term (current) drug therapy: Secondary | ICD-10-CM | POA: Diagnosis not present

## 2021-12-30 DIAGNOSIS — Z7902 Long term (current) use of antithrombotics/antiplatelets: Secondary | ICD-10-CM | POA: Diagnosis not present

## 2021-12-30 DIAGNOSIS — I110 Hypertensive heart disease with heart failure: Secondary | ICD-10-CM | POA: Diagnosis not present

## 2021-12-30 DIAGNOSIS — Z8551 Personal history of malignant neoplasm of bladder: Secondary | ICD-10-CM | POA: Diagnosis not present

## 2021-12-30 DIAGNOSIS — C672 Malignant neoplasm of lateral wall of bladder: Secondary | ICD-10-CM | POA: Diagnosis not present

## 2021-12-30 DIAGNOSIS — D494 Neoplasm of unspecified behavior of bladder: Secondary | ICD-10-CM | POA: Diagnosis not present

## 2021-12-30 DIAGNOSIS — Z87891 Personal history of nicotine dependence: Secondary | ICD-10-CM | POA: Diagnosis not present

## 2021-12-31 ENCOUNTER — Other Ambulatory Visit: Payer: Self-pay

## 2021-12-31 DIAGNOSIS — I10 Essential (primary) hypertension: Secondary | ICD-10-CM

## 2021-12-31 DIAGNOSIS — I200 Unstable angina: Secondary | ICD-10-CM

## 2021-12-31 DIAGNOSIS — R011 Cardiac murmur, unspecified: Secondary | ICD-10-CM

## 2021-12-31 MED ORDER — NITROGLYCERIN 0.4 MG SL SUBL: 0.4000 mg | SUBLINGUAL_TABLET | SUBLINGUAL | 11 refills | Status: DC | PRN

## 2021-12-31 NOTE — Telephone Encounter (Signed)
Pt's medication was sent to pt's pharmacy as requested. Confirmation received.  °

## 2022-01-07 ENCOUNTER — Ambulatory Visit: Payer: Self-pay | Admitting: *Deleted

## 2022-01-07 ENCOUNTER — Encounter: Payer: Self-pay | Admitting: *Deleted

## 2022-01-07 NOTE — Patient Outreach (Signed)
  Care Coordination   Follow Up Visit Note   01/07/2022 Name: [name] MRN: [id number] DOB: 1945/02/18  [name] is a 75 y.o. year old male who sees Steve Sites, MD for primary care. I spoke with  [name] by phone today.  What matters to the patients health and wellness today?  Biopsy results from cystoscopy    Goals Addressed             This Visit's Progress    Care Coordination Services   On track    Care Coordination Interventions: Evaluation of current treatment plan related to recent cystoscopy with biopsy and patient's adherence to plan as established by provider Reviewed scheduled/upcoming provider appointments including Dr [name] (urology) on 01/28/21 for follow-up Discussed plans with patient for ongoing care management follow up and provided patient with direct contact information for care management team Assessed social determinant of health barriers Discussed recent cystoscopy procedure and pending biopsy results. Patient reports feeling well. No pain and had very minimal bleeding. He is drinking at least 6-8 glasses of water a day and urinating frequently with no problems. Overall he feels well and was in a very pleasant mood. He feels that the procedure went well and that the biopsy report will be favorable based on conversations he's had with urologist.  Provided with [facility] contact number and encouraged to reach out as needed         SDOH assessments and interventions completed:  Yes  SDOH Interventions Today    Flowsheet Row Most Recent Value  SDOH Interventions   Food Insecurity Interventions Intervention Not Indicated  Housing Interventions Intervention Not Indicated  Transportation Interventions Intervention Not Indicated  Financial Strain Interventions Intervention Not Indicated        Care Coordination Interventions:  Yes, provided   Follow up plan: Follow up call scheduled for 02/01/22    Encounter Outcome:  Pt.  Visit Completed   [name], BSN, RN-BC RN Care Coordinator [place]: [phone] Main #: [phone]

## 2022-01-10 ENCOUNTER — Other Ambulatory Visit (INDEPENDENT_AMBULATORY_CARE_PROVIDER_SITE_OTHER): Payer: Self-pay | Admitting: Gastroenterology

## 2022-01-14 ENCOUNTER — Other Ambulatory Visit: Payer: Self-pay

## 2022-01-14 MED ORDER — ATORVASTATIN CALCIUM 40 MG PO TABS: 20.0000 mg | ORAL_TABLET | Freq: Every evening | ORAL | 3 refills | Status: DC

## 2022-01-15 DIAGNOSIS — E782 Mixed hyperlipidemia: Secondary | ICD-10-CM | POA: Diagnosis not present

## 2022-01-15 DIAGNOSIS — G629 Polyneuropathy, unspecified: Secondary | ICD-10-CM | POA: Diagnosis not present

## 2022-01-15 DIAGNOSIS — E119 Type 2 diabetes mellitus without complications: Secondary | ICD-10-CM | POA: Diagnosis not present

## 2022-01-15 DIAGNOSIS — E039 Hypothyroidism, unspecified: Secondary | ICD-10-CM | POA: Diagnosis not present

## 2022-01-15 DIAGNOSIS — I25810 Atherosclerosis of coronary artery bypass graft(s) without angina pectoris: Secondary | ICD-10-CM | POA: Diagnosis not present

## 2022-01-21 DIAGNOSIS — Z8679 Personal history of other diseases of the circulatory system: Secondary | ICD-10-CM | POA: Diagnosis not present

## 2022-01-21 DIAGNOSIS — G629 Polyneuropathy, unspecified: Secondary | ICD-10-CM | POA: Diagnosis not present

## 2022-01-21 DIAGNOSIS — D500 Iron deficiency anemia secondary to blood loss (chronic): Secondary | ICD-10-CM | POA: Diagnosis not present

## 2022-01-21 DIAGNOSIS — C672 Malignant neoplasm of lateral wall of bladder: Secondary | ICD-10-CM | POA: Diagnosis not present

## 2022-01-21 DIAGNOSIS — Z9221 Personal history of antineoplastic chemotherapy: Secondary | ICD-10-CM | POA: Diagnosis not present

## 2022-01-21 DIAGNOSIS — C678 Malignant neoplasm of overlapping sites of bladder: Secondary | ICD-10-CM | POA: Diagnosis not present

## 2022-01-22 ENCOUNTER — Other Ambulatory Visit (HOSPITAL_COMMUNITY): Payer: Self-pay | Admitting: Unknown Physician Specialty

## 2022-01-22 DIAGNOSIS — C672 Malignant neoplasm of lateral wall of bladder: Secondary | ICD-10-CM

## 2022-01-28 DIAGNOSIS — C678 Malignant neoplasm of overlapping sites of bladder: Secondary | ICD-10-CM | POA: Diagnosis not present

## 2022-01-29 DIAGNOSIS — C679 Malignant neoplasm of bladder, unspecified: Secondary | ICD-10-CM | POA: Insufficient documentation

## 2022-02-01 ENCOUNTER — Inpatient Hospital Stay: Payer: Medicare Other | Admitting: Hematology

## 2022-02-01 ENCOUNTER — Ambulatory Visit: Payer: Self-pay | Admitting: *Deleted

## 2022-02-01 ENCOUNTER — Encounter: Payer: Self-pay | Admitting: *Deleted

## 2022-02-01 NOTE — Progress Notes (Deleted)
[facility] NOTE  Patient Care Team: Sharilyn Sites, MD as PCP - General (Family Medicine) Sherren Mocha, MD as PCP - Cardiology (Cardiology) Ilean China, RN as [place] Management  CHIEF COMPLAINTS/PURPOSE OF CONSULTATION:  ***  HISTORY OF PRESENTING ILLNESS:  [name] 77 y.o. male is here because of ***  MEDICAL HISTORY:  Past Medical History:  Diagnosis Date   Anginal pain ([place])    Arthritis    "all over"   Bladder tumor    Chronic lower back pain    Coronary artery disease    a. s/p CABG x 2 (LIMA->LAD, RIMA->RCA);  b. s/p multiple PCI's to Ramus;  c. 08/2011 Cath/PCI: LM 70% into ramus with 70-80% there->treated wtih 3.5x18 Xience Xpedition DES, LCX  nonobs, RCA occluded.  RIMA & LIMA patent, EF 55-65%   DJD (degenerative joint disease)    GERD (gastroesophageal reflux disease)    History of gout    Hyperlipidemia    Hypertension    Hypothyroidism    Neuropathy    OSA on CPAP    Prostate cancer ([place])     SURGICAL HISTORY: Past Surgical History:  Procedure Laterality Date   ABDOMINAL HERNIA REPAIR     BIOPSY  11/26/2015   Procedure: BIOPSY;  Surgeon: Rogene Houston, MD;  [facility];  Service: Endoscopy;;  gastric esophagus   BIOPSY  01/20/2021   Procedure: BIOPSY;  Surgeon: Harvel Quale, MD;  [facility];  Service: Gastroenterology;;   BIOPSY  10/28/2021   Procedure: BIOPSY;  Surgeon: Harvel Quale, MD;  [facility];  Service: Gastroenterology;;   BLADDER TUMOR EXCISION     BROW LIFT Bilateral 04/27/2019   Procedure: BILATERAL BLEPHAROPLASTY;  Surgeon: Baruch Goldmann, MD;  Location: [facility];  Service: Ophthalmology;  Laterality: Bilateral;   CARDIAC CATHETERIZATION N/A 04/10/2015   Procedure: Left Heart Cath and Cors/Grafts Angiography;  Surgeon: Sherren Mocha, MD;  Location: [place] [facility];  Service: Cardiovascular;  Laterality: N/A;    CARDIAC CATHETERIZATION N/A 04/10/2015   Procedure: Coronary Stent Intervention;  Surgeon: Sherren Mocha, MD;  Location: [place] [facility];  Service: Cardiovascular;  Laterality: N/A;   CATARACT EXTRACTION W/PHACO Right 06/23/2015   Procedure: CATARACT EXTRACTION PHACO AND INTRAOCULAR LENS PLACEMENT RIGHT EYE; CDE:  7.08;  Surgeon: Tonny Branch, MD;  Location: [facility];  Service: Ophthalmology;  Laterality: Right;   CATARACT EXTRACTION W/PHACO Left 08/25/2015   Procedure: CATARACT EXTRACTION PHACO AND INTRAOCULAR LENS PLACEMENT LEFT EYE; CDE:  8.18;  Surgeon: Tonny Branch, MD;  Location: [facility];  Service: Ophthalmology;  Laterality: Left;   COLONOSCOPY N/A 12/28/2012   Procedure: COLONOSCOPY;  Surgeon: Rogene Houston, MD;  [facility];  Service: Endoscopy;  Laterality: N/A;  830 rescheduled   COLONOSCOPY WITH PROPOFOL N/A 01/20/2021   Procedure: COLONOSCOPY WITH PROPOFOL;  Surgeon: Harvel Quale, MD;  [facility];  Service: Gastroenterology;  Laterality: N/A;  11:05   CORONARY ANGIOPLASTY WITH STENT PLACEMENT  2013; 04/10/2015   "this makes me a total of 7" (04/10/2015)   CORONARY ARTERY BYPASS GRAFT  1997   with (LIMA)   ESOPHAGEAL DILATION N/A 11/26/2015   Procedure: ESOPHAGEAL DILATION;  Surgeon: Rogene Houston, MD;  [facility];  Service: Endoscopy;  Laterality: N/A;   ESOPHAGEAL DILATION N/A 05/23/2017   Procedure: ESOPHAGEAL DILATION;  Surgeon: Rogene Houston, MD;  [facility];  Service:  Endoscopy;  Laterality: N/A;   ESOPHAGEAL DILATION N/A 06/16/2017   Procedure: ESOPHAGEAL DILATION;  Surgeon: Rogene Houston, MD;  [facility];  Service: Endoscopy;  Laterality: N/A;   ESOPHAGEAL DILATION  10/28/2021   Procedure: ESOPHAGEAL DILATION;  Surgeon: Harvel Quale, MD;  [facility];  Service: Gastroenterology;;   ESOPHAGOGASTRODUODENOSCOPY N/A 11/26/2015   Procedure: ESOPHAGOGASTRODUODENOSCOPY (EGD);   Surgeon: Rogene Houston, MD;  [facility];  Service: Endoscopy;  Laterality: N/A;  1:25   ESOPHAGOGASTRODUODENOSCOPY N/A 05/23/2017   Procedure: ESOPHAGOGASTRODUODENOSCOPY (EGD);  Surgeon: Rogene Houston, MD;  [facility];  Service: Endoscopy;  Laterality: N/A;  1:15   ESOPHAGOGASTRODUODENOSCOPY N/A 06/16/2017   Procedure: ESOPHAGOGASTRODUODENOSCOPY (EGD);  Surgeon: Rogene Houston, MD;  [facility];  Service: Endoscopy;  Laterality: N/A;  1240   ESOPHAGOGASTRODUODENOSCOPY (EGD) WITH PROPOFOL N/A 01/20/2021   Procedure: ESOPHAGOGASTRODUODENOSCOPY (EGD) WITH PROPOFOL;  Surgeon: Harvel Quale, MD;  [facility];  Service: Gastroenterology;  Laterality: N/A;   ESOPHAGOGASTRODUODENOSCOPY (EGD) WITH PROPOFOL N/A 10/28/2021   Procedure: ESOPHAGOGASTRODUODENOSCOPY (EGD) WITH PROPOFOL;  Surgeon: Harvel Quale, MD;  [facility];  Service: Gastroenterology;  Laterality: N/A;  215 ASA 2, pt will arrive at 10:45   HERNIA REPAIR Left    KNEE CARTILAGE SURGERY Bilateral    LEFT HEART CATH AND CORS/GRAFTS ANGIOGRAPHY N/A 09/08/2016   Procedure: LEFT HEART CATH AND CORS/GRAFTS ANGIOGRAPHY;  Surgeon: Martinique, Peter M, MD;  Location: [place] [facility];  Service: Cardiovascular;  Laterality: N/A;   LEFT HEART CATHETERIZATION WITH CORONARY ANGIOGRAM N/A 09/08/2011   Procedure: LEFT HEART CATHETERIZATION WITH CORONARY ANGIOGRAM;  Surgeon: Sherren Mocha, MD;  Location: [facility] CATH LAB;  Service: Cardiovascular;  Laterality: N/A;   PERCUTANEOUS CORONARY STENT INTERVENTION (PCI-S) Right 09/08/2011   Procedure: PERCUTANEOUS CORONARY STENT INTERVENTION (PCI-S);  Surgeon: Sherren Mocha, MD;  Location: [facility] CATH LAB;  Service: Cardiovascular;  Laterality: Right;   POLYPECTOMY  01/20/2021   Procedure: POLYPECTOMY;  Surgeon: Harvel Quale, MD;  [facility];  Service: Gastroenterology;;   ROBOT ASSISTED LAPAROSCOPIC RADICAL  PROSTATECTOMY      SOCIAL HISTORY: Social History   Socioeconomic History   Marital status: Widowed    Spouse name: Not on file   Number of children: Not on file   Years of education: Not on file   Highest education level: Not on file  Occupational History   Occupation: semi-retired  Tobacco Use   Smoking status: Former    Types: Cigars    Passive exposure: Past   Smokeless tobacco: Never   Tobacco comments:    "quit smoking in ~ 1978"  Vaping Use   Vaping Use: Never used  Substance and Sexual Activity   Alcohol use: Yes    Comment: occasionally    Drug use: No   Sexual activity: Not Currently  Other Topics Concern   Not on file  Social History Narrative   Not on file   Social Determinants of Health   Financial Resource Strain: Low Risk  (01/07/2022)   Overall Financial Resource Strain (CARDIA)    Difficulty of Paying Living Expenses: Not hard at all  Food Insecurity: No Food Insecurity (01/07/2022)   Hunger Vital Sign    Worried About Running Out of Food in the Last Year: Never true    [place] in the Last Year: Never true  Transportation Needs: No Transportation Needs (02/01/2022)   [place] - Transportation  Lack of Transportation (Medical): No    Lack of Transportation (Non-Medical): No  Physical Activity: Not on file  Stress: Not on file  Social Connections: Not on file  Intimate Partner Violence: Not on file    FAMILY HISTORY: Family History  Problem Relation Age of Onset   Cancer Father 64       died   Stroke Mother 57       died    ALLERGIES:  is allergic to codeine and pollen extract.  MEDICATIONS:  Current Outpatient Medications  Medication Sig Dispense Refill   amLODipine (NORVASC) 10 MG tablet Take 10 mg by mouth in the morning.     aspirin EC 81 MG tablet Take 81 mg by mouth every evening. 30 tablet    atorvastatin (LIPITOR) 40 MG tablet Take 0.5 tablets (20 mg total) by mouth every evening. 45 tablet 3   B Complex-C (B-COMPLEX  WITH VITAMIN C) tablet Take 1 tablet by mouth every evening.     BLACK CURRANT SEED OIL PO Take 1 each by mouth 2 (two) times a week. 1 teaspoonful twice weekly     carvedilol (COREG) 6.25 MG tablet Take 1 tablet (6.25 mg total) by mouth 2 (two) times daily. 180 tablet 3   celecoxib (CELEBREX) 200 MG capsule Take 200 mg by mouth in the morning.     cetirizine (ZYRTEC) 10 MG tablet Take 10 mg by mouth every evening.     cholecalciferol (VITAMIN D3) 25 MCG (1000 UNIT) tablet Take 1,000 Units by mouth every evening.     clopidogrel (PLAVIX) 75 MG tablet Take 1 tablet (75 mg total) by mouth in the morning. 90 tablet 3   Coenzyme Q10 300 MG CAPS Take 300 mg by mouth every evening.     cyanocobalamin (,VITAMIN B-12,) 1000 MCG/ML injection Inject 1,000 mcg into the muscle See admin instructions. Inject (1000 mcg) intramuscularly once monthly as needed for B-12 deficiency.  2   fenofibrate 160 MG tablet Take 1 tablet (160 mg total) by mouth daily. (Patient taking differently: Take 160 mg by mouth every evening.) 30 tablet 11   FEROSUL 325 (65 Fe) MG tablet TAKE 1 TABLET(325 MG) BY MOUTH TWICE DAILY WITH A MEAL 180 tablet 3   gabapentin (NEURONTIN) 600 MG tablet Take 600 mg by mouth 3 (three) times daily.  3   GLUCOSAMINE-CHONDROITIN PO Take 1 tablet by mouth every evening.     isosorbide mononitrate (IMDUR) 60 MG 24 hr tablet Take 1.5 tablets (90 mg total) by mouth daily. (Patient taking differently: Take 90 mg by mouth every evening.) 135 tablet 3   levothyroxine (SYNTHROID, LEVOTHROID) 150 MCG tablet Take 150 mcg by mouth daily before breakfast.     meclizine (ANTIVERT) 25 MG tablet Take 1 tablet (25 mg total) by mouth 2 (two) times daily as needed for dizziness. (Patient taking differently: Take 12.5 mg by mouth at bedtime as needed for dizziness.) 30 tablet 0   nitroGLYCERIN (NITROSTAT) 0.4 MG SL tablet Place 1 tablet (0.4 mg total) under the tongue every 5 (five) minutes as needed for chest pain. 25  tablet 11   Omega-3 Fatty Acids (FISH OIL) 1000 MG CAPS Take 1 capsule by mouth in the morning.     Polyethyl Glycol-Propyl Glycol (LUBRICANT EYE DROPS) 0.4-0.3 % SOLN Place 1-2 drops into both eyes 3 (three) times daily as needed (dry/irritated eyes.).     Potassium 99 MG TABS Take 99 mg by mouth in the morning and at bedtime.  Probiotic Product (PROBIOTIC PO) Take 1 capsule by mouth every evening.     psyllium (METAMUCIL) 58.6 % powder Take 1 packet by mouth 2 (two) times a week.     traMADol (ULTRAM) 50 MG tablet Take 50 mg by mouth 3 (three) times daily.  1   No current facility-administered medications for this visit.    REVIEW OF SYSTEMS:   Constitutional: Denies fevers, chills or abnormal night sweats Eyes: Denies blurriness of vision, double vision or watery eyes Ears, nose, mouth, throat, and face: Denies mucositis or sore throat Respiratory: Denies cough, dyspnea or wheezes Cardiovascular: Denies palpitation, chest discomfort or lower extremity swelling Gastrointestinal:  Denies nausea, heartburn or change in bowel habits Skin: Denies abnormal skin rashes Lymphatics: Denies new lymphadenopathy or easy bruising Neurological:Denies numbness, tingling or new weaknesses Behavioral/Psych: Mood is stable, no new changes  All other systems were reviewed with the patient and are negative.  PHYSICAL EXAMINATION: ECOG PERFORMANCE STATUS: {CHL ONC ECOG PS:[id number]}  There were no vitals filed for this visit. There were no vitals filed for this visit.  GENERAL:alert, no distress and comfortable SKIN: skin color, texture, turgor are normal, no rashes or significant lesions EYES: normal, conjunctiva are pink and non-injected, sclera clear OROPHARYNX:no exudate, no erythema and lips, buccal mucosa, and tongue normal  NECK: supple, thyroid normal size, non-tender, without nodularity LYMPH:  no palpable lymphadenopathy in the cervical, axillary or inguinal LUNGS: clear to  auscultation and percussion with normal breathing effort HEART: regular rate & rhythm and no murmurs and no lower extremity edema ABDOMEN:abdomen soft, non-tender and normal bowel sounds Musculoskeletal:no cyanosis of digits and no clubbing  PSYCH: alert & oriented x 3 with fluent speech NEURO: no focal motor/sensory deficits  LABORATORY DATA:  I have reviewed the data as listed Lab Results  Component Value Date   WBC 5.7 12/14/2021   HGB 11.9 (L) 12/14/2021   HCT 35.1 (L) 12/14/2021   MCV 99.2 12/14/2021   PLT 207 12/14/2021     Chemistry      Component Value Date/Time   NA 138 12/14/2021 1124   NA 145 (H) 11/11/2021 0000   K 4.6 12/14/2021 1124   CL 103 12/14/2021 1124   CO2 29 12/14/2021 1124   BUN 26 (H) 12/14/2021 1124   BUN 19 11/11/2021 0000   CREATININE 1.69 (H) 12/14/2021 1124      Component Value Date/Time   CALCIUM 9.3 12/14/2021 1124   ALKPHOS 78 10/06/2021 1542   AST 20 12/14/2021 1124   ALT 25 12/14/2021 1124   BILITOT 0.4 12/14/2021 1124   BILITOT 0.3 10/06/2021 1542       RADIOGRAPHIC STUDIES: I have personally reviewed the radiological images as listed and agreed with the findings in the report. No results found.  ASSESSMENT:  1.  T2 N0 muscle invasive bladder cancer: -  2.  Social/family history:  3.  Prostate cancer: - Status post prostatectomy in 2008      PLAN:    No orders of the defined types were placed in this encounter.   All questions were answered. The patient knows to call the clinic with any problems, questions or concerns.      Derek Jack, MD 02/01/2022 12:31 PM

## 2022-02-01 NOTE — Patient Outreach (Signed)
  Care Coordination   Follow Up Visit Note   02/01/2022 Name: [name] MRN: [id number] DOB: 06-Jun-1945  [name] is a 77 y.o. year old male who sees Steve Sites, MD for primary care. I spoke with  [name] by phone today.  What matters to the patients health and wellness today?  Managing bladder cancer    Goals Addressed             This Visit's Progress    Care Coordination Services       Care Coordination Interventions: Evaluation of current treatment plan related to malignant bladder biopsy results and patient's adherence to plan as established by provider Reviewed scheduled/upcoming provider appointments including new patient appt with [name] on 02/01/22, CT [facility] at [facility] on 02/08/22, [name] (rad onc with [facility]) 02/10/22  Discussed plans with patient for ongoing care management follow up and provided patient with direct contact information for care management team Assessed social determinant of health barriers Discussed positive biopsy results and plan of care. CT is scheduled and he will f/u with oncology and radiation oncology regarding treatment plan. They have discussed chemo and bladder removal as potential treatment options as well. He will review these options in more detail with providers.  Discussed that if he does have chemo scheduled, he may need transportation assistance so that he doesn't have to rely on friends/family to take him to each appt. We will discuss further once a plan is in place.  Provided with [place] contact number and encouraged to reach out as needed         SDOH assessments and interventions completed:  Yes  SDOH Interventions Today    Flowsheet Row Most Recent Value  SDOH Interventions   Transportation Interventions Intervention Not Indicated        Care Coordination Interventions:  Yes, provided   Follow up plan: Follow up call scheduled for 02/17/22    Encounter Outcome:  Pt. Visit Completed    [name], BSN, RN-BC RN Care Coordinator [place]: [phone] Main #: [phone]

## 2022-02-04 ENCOUNTER — Encounter: Payer: Self-pay | Admitting: Hematology

## 2022-02-04 ENCOUNTER — Inpatient Hospital Stay: Payer: Medicare Other | Attending: Hematology | Admitting: Hematology

## 2022-02-04 ENCOUNTER — Inpatient Hospital Stay: Payer: Medicare Other

## 2022-02-04 VITALS — BP 132/58 | HR 63 | Temp 98.4°F | Resp 16 | Ht 73.0 in | Wt 219.3 lb

## 2022-02-04 DIAGNOSIS — C672 Malignant neoplasm of lateral wall of bladder: Secondary | ICD-10-CM | POA: Diagnosis not present

## 2022-02-04 DIAGNOSIS — N189 Chronic kidney disease, unspecified: Secondary | ICD-10-CM | POA: Insufficient documentation

## 2022-02-04 DIAGNOSIS — C7989 Secondary malignant neoplasm of other specified sites: Secondary | ICD-10-CM

## 2022-02-04 DIAGNOSIS — E039 Hypothyroidism, unspecified: Secondary | ICD-10-CM | POA: Insufficient documentation

## 2022-02-04 DIAGNOSIS — I129 Hypertensive chronic kidney disease with stage 1 through stage 4 chronic kidney disease, or unspecified chronic kidney disease: Secondary | ICD-10-CM | POA: Insufficient documentation

## 2022-02-04 DIAGNOSIS — D509 Iron deficiency anemia, unspecified: Secondary | ICD-10-CM | POA: Diagnosis not present

## 2022-02-04 DIAGNOSIS — Z87891 Personal history of nicotine dependence: Secondary | ICD-10-CM | POA: Diagnosis not present

## 2022-02-04 DIAGNOSIS — C678 Malignant neoplasm of overlapping sites of bladder: Secondary | ICD-10-CM

## 2022-02-04 LAB — COMPREHENSIVE METABOLIC PANEL
ALT: 27 U/L (ref 0–44)
AST: 27 U/L (ref 15–41)
Albumin: 3.8 g/dL (ref 3.5–5.0)
Alkaline Phosphatase: 40 U/L (ref 38–126)
Anion gap: 9 (ref 5–15)
BUN: 20 mg/dL (ref 8–23)
CO2: 26 mmol/L (ref 22–32)
Calcium: 8.8 mg/dL — ABNORMAL LOW (ref 8.9–10.3)
Chloride: 104 mmol/L (ref 98–111)
Creatinine, Ser: 1.35 mg/dL — ABNORMAL HIGH (ref 0.61–1.24)
GFR, Estimated: 54 mL/min — ABNORMAL LOW (ref >60)
Glucose, Bld: 109 mg/dL — ABNORMAL HIGH (ref 70–99)
Potassium: 4.3 mmol/L (ref 3.5–5.1)
Sodium: 139 mmol/L (ref 135–145)
Total Bilirubin: 0.6 mg/dL (ref 0.3–1.2)
Total Protein: 6.8 g/dL (ref 6.5–8.1)

## 2022-02-04 LAB — CBC WITH DIFFERENTIAL/PLATELET
Abs Immature Granulocytes: 0.02 10*3/uL (ref 0.00–0.07)
Basophils Absolute: 0 10*3/uL (ref 0.0–0.1)
Basophils Relative: 1 %
Eosinophils Absolute: 0.1 10*3/uL (ref 0.0–0.5)
Eosinophils Relative: 2 %
HCT: 31.7 % — ABNORMAL LOW (ref 39.0–52.0)
Hemoglobin: 10.4 g/dL — ABNORMAL LOW (ref 13.0–17.0)
Immature Granulocytes: 0 %
Lymphocytes Relative: 29 %
Lymphs Abs: 1.5 10*3/uL (ref 0.7–4.0)
MCH: 33.4 pg (ref 26.0–34.0)
MCHC: 32.8 g/dL (ref 30.0–36.0)
MCV: 101.9 fL — ABNORMAL HIGH (ref 80.0–100.0)
Monocytes Absolute: 0.5 10*3/uL (ref 0.1–1.0)
Monocytes Relative: 10 %
Neutro Abs: 3.1 10*3/uL (ref 1.7–7.7)
Neutrophils Relative %: 58 %
Platelets: 186 10*3/uL (ref 150–400)
RBC: 3.11 MIL/uL — ABNORMAL LOW (ref 4.22–5.81)
RDW: 13.6 % (ref 11.5–15.5)
WBC: 5.3 10*3/uL (ref 4.0–10.5)
nRBC: 0 % (ref 0.0–0.2)

## 2022-02-04 LAB — IRON AND TIBC
Iron: 127 ug/dL (ref 45–182)
Saturation Ratios: 32 % (ref 17.9–39.5)
TIBC: 398 ug/dL (ref 250–450)
UIBC: 271 ug/dL

## 2022-02-04 LAB — FERRITIN: Ferritin: 93 ng/mL (ref 24–336)

## 2022-02-04 NOTE — Patient Instructions (Addendum)
[place]  Discharge Instructions  You were seen and examined today by Dr. Delton Coombes. Dr. Delton Coombes is a medical oncologist, meaning that he specializes in the treatment of cancer diagnoses. Dr. Delton Coombes discussed your past medical history, family history of cancers, and the events that led to you being here today.  You were referred to Dr. Delton Coombes due to a diagnosis of bladder cancer.  There are two possible courses of treatment for your diagnosis: Surgery. This is considered definitive treatment and is considered curative. Chemotherapy and Radiation. Radiation is given daily. Chemotherapy is given on weeks one and four of radiation and is given as a medication push and a pump. You will keep the pump on from Monday to Friday on weeks one and four. For chemotherapy, you will need a Port-A-Cath.  Whichever course of action you decide to proceed with, please let us know!  Dr. Delton Coombes has recommended additional labs today to recheck your kidney function and basic blood counts. Proceed with the CT scan on Monday and Dr. Delton Coombes will schedule a bone scan to ensure there is no spread of the cancer to the bones.  Follow-up as scheduled.  Thank you for choosing [facility] to provide your oncology and hematology care.   To afford each patient quality time with our provider, please arrive at least 15 minutes before your scheduled appointment time. You may need to reschedule your appointment if you arrive late (10 or more minutes). Arriving late affects you and other patients whose appointments are after yours.  Also, if you miss three or more appointments without notifying the office, you may be dismissed from the clinic at the provider's discretion.    Again, thank you for choosing [facility].  Our hope is that these requests will decrease the amount of time that you wait before being seen by our physicians.   If you  have a lab appointment with the [facility] please come in thru the Main Entrance and check in at the main information desk.           _____________________________________________________________  Should you have questions after your visit to [facility], please contact our office at [phone] and follow the prompts.  Our office hours are 8:00 a.m. to 4:30 p.m. Monday - Thursday and 8:00 a.m. to 2:30 p.m. Friday.  Please note that voicemails left after 4:00 p.m. may not be returned until the following business day.  We are closed weekends and all major holidays.  You do have access to a nurse 24-7, just call the main number to the clinic [phone] and do not press any options, hold on the line and a nurse will answer the phone.    For prescription refill requests, have your pharmacy contact our office and allow 72 hours.    Masks are optional in the cancer centers. If you would like for your care team to wear a mask while they are taking care of you, please let them know. You may have one support person who is at least 79 years old accompany you for your appointments.

## 2022-02-04 NOTE — Progress Notes (Signed)
[facility] NOTE  Patient Care Team: Sharilyn Sites, MD as PCP - General (Family Medicine) Sherren Mocha, MD as PCP - Cardiology (Cardiology) Ilean China, RN as [place] Management Derek Jack, MD as Medical Oncologist (Medical Oncology) Brien Mates, RN as Oncology Nurse Navigator (Medical Oncology)  CHIEF COMPLAINTS/PURPOSE OF CONSULTATION:  T2 N0 bladder cancer.  HISTORY OF PRESENTING ILLNESS:  [name] 77 y.o. male is seen in consultation today at the request of Dr. Roslyn Smiling at [facility].  This patient had bladder cancer which was initially diagnosed on 12/16/2014 in the lateral wall.  He underwent TURBT in May 2020 followed by BCG induction.  Later he received intravesicular valrubicin in August 2020.  He will again had BCG therapy in October 2021.  In June 2022 through August 2022 he received intravesical gemcitabine/docetaxel.  Cystoscopy on 12/30/2021 showed lesions throughout the bladder, muscle invasive at left lateral wall.  He is accompanied by his daughter today.  He reports energy levels of 80%.  He is fairly active and lives at home by himself and is independent of ADLs and IADLs.  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  He was evaluated by Dr. Dyanne Iha at [facility] and discussed surgical options.  He wants to pursue the nonsurgical option with trimodality therapy if he can receive curative dosing.  MEDICAL HISTORY:  Past Medical History:  Diagnosis Date   Anginal pain ([place])    Arthritis    "all over"   Bladder tumor    Chronic lower back pain    Coronary artery disease    a. s/p CABG x 2 (LIMA->LAD, RIMA->RCA);  b. s/p multiple PCI's to Ramus;  c. 08/2011 Cath/PCI: LM 70% into ramus with 70-80% there->treated wtih 3.5x18 Xience Xpedition DES, LCX  nonobs, RCA occluded.  RIMA & LIMA patent, EF 55-65%   DJD (degenerative joint disease)    GERD (gastroesophageal reflux  disease)    History of gout    Hyperlipidemia    Hypertension    Hypothyroidism    Neuropathy    OSA on CPAP    Prostate cancer ([place])     SURGICAL HISTORY: Past Surgical History:  Procedure Laterality Date   ABDOMINAL HERNIA REPAIR     BIOPSY  11/26/2015   Procedure: BIOPSY;  Surgeon: Rogene Houston, MD;  [facility];  Service: Endoscopy;;  gastric esophagus   BIOPSY  01/20/2021   Procedure: BIOPSY;  Surgeon: Harvel Quale, MD;  [facility];  Service: Gastroenterology;;   BIOPSY  10/28/2021   Procedure: BIOPSY;  Surgeon: Harvel Quale, MD;  [facility];  Service: Gastroenterology;;   BLADDER TUMOR EXCISION     BROW LIFT Bilateral 04/27/2019   Procedure: BILATERAL BLEPHAROPLASTY;  Surgeon: Baruch Goldmann, MD;  Location: [facility];  Service: Ophthalmology;  Laterality: Bilateral;   CARDIAC CATHETERIZATION N/A 04/10/2015   Procedure: Left Heart Cath and Cors/Grafts Angiography;  Surgeon: Sherren Mocha, MD;  Location: [place] CV LAB;  Service: Cardiovascular;  Laterality: N/A;   CARDIAC CATHETERIZATION N/A 04/10/2015   Procedure: Coronary Stent Intervention;  Surgeon: Sherren Mocha, MD;  Location: [place] CV LAB;  Service: Cardiovascular;  Laterality: N/A;   CATARACT EXTRACTION W/PHACO Right 06/23/2015   Procedure: CATARACT EXTRACTION PHACO AND INTRAOCULAR LENS PLACEMENT RIGHT EYE; CDE:  7.08;  Surgeon: Tonny Branch, MD;  Location: [facility];  Service: Ophthalmology;  Laterality: Right;   CATARACT EXTRACTION W/PHACO  Left 08/25/2015   Procedure: CATARACT EXTRACTION PHACO AND INTRAOCULAR LENS PLACEMENT LEFT EYE; CDE:  8.18;  Surgeon: Tonny Branch, MD;  Location: [facility];  Service: Ophthalmology;  Laterality: Left;   COLONOSCOPY N/A 12/28/2012   Procedure: COLONOSCOPY;  Surgeon: Rogene Houston, MD;  [facility];  Service: Endoscopy;  Laterality: N/A;  830 rescheduled   COLONOSCOPY WITH PROPOFOL N/A 01/20/2021   Procedure:  COLONOSCOPY WITH PROPOFOL;  Surgeon: Harvel Quale, MD;  [facility];  Service: Gastroenterology;  Laterality: N/A;  11:05   CORONARY ANGIOPLASTY WITH STENT PLACEMENT  2013; 04/10/2015   "this makes me a total of 7" (04/10/2015)   CORONARY ARTERY BYPASS GRAFT  1997   with (LIMA)   ESOPHAGEAL DILATION N/A 11/26/2015   Procedure: ESOPHAGEAL DILATION;  Surgeon: Rogene Houston, MD;  [facility];  Service: Endoscopy;  Laterality: N/A;   ESOPHAGEAL DILATION N/A 05/23/2017   Procedure: ESOPHAGEAL DILATION;  Surgeon: Rogene Houston, MD;  [facility];  Service: Endoscopy;  Laterality: N/A;   ESOPHAGEAL DILATION N/A 06/16/2017   Procedure: ESOPHAGEAL DILATION;  Surgeon: Rogene Houston, MD;  [facility];  Service: Endoscopy;  Laterality: N/A;   ESOPHAGEAL DILATION  10/28/2021   Procedure: ESOPHAGEAL DILATION;  Surgeon: Harvel Quale, MD;  [facility];  Service: Gastroenterology;;   ESOPHAGOGASTRODUODENOSCOPY N/A 11/26/2015   Procedure: ESOPHAGOGASTRODUODENOSCOPY (EGD);  Surgeon: Rogene Houston, MD;  [facility];  Service: Endoscopy;  Laterality: N/A;  1:25   ESOPHAGOGASTRODUODENOSCOPY N/A 05/23/2017   Procedure: ESOPHAGOGASTRODUODENOSCOPY (EGD);  Surgeon: Rogene Houston, MD;  [facility];  Service: Endoscopy;  Laterality: N/A;  1:15   ESOPHAGOGASTRODUODENOSCOPY N/A 06/16/2017   Procedure: ESOPHAGOGASTRODUODENOSCOPY (EGD);  Surgeon: Rogene Houston, MD;  [facility];  Service: Endoscopy;  Laterality: N/A;  1240   ESOPHAGOGASTRODUODENOSCOPY (EGD) WITH PROPOFOL N/A 01/20/2021   Procedure: ESOPHAGOGASTRODUODENOSCOPY (EGD) WITH PROPOFOL;  Surgeon: Harvel Quale, MD;  [facility];  Service: Gastroenterology;  Laterality: N/A;   ESOPHAGOGASTRODUODENOSCOPY (EGD) WITH PROPOFOL N/A 10/28/2021   Procedure: ESOPHAGOGASTRODUODENOSCOPY (EGD) WITH PROPOFOL;  Surgeon: Harvel Quale, MD;  [facility];  Service: Gastroenterology;  Laterality: N/A;  215 ASA 2, pt will arrive at 10:45   HERNIA REPAIR Left    KNEE CARTILAGE SURGERY Bilateral    LEFT HEART CATH AND CORS/GRAFTS ANGIOGRAPHY N/A 09/08/2016   Procedure: LEFT HEART CATH AND CORS/GRAFTS ANGIOGRAPHY;  Surgeon: Martinique, Peter M, MD;  Location: [place] CV LAB;  Service: Cardiovascular;  Laterality: N/A;   LEFT HEART CATHETERIZATION WITH CORONARY ANGIOGRAM N/A 09/08/2011   Procedure: LEFT HEART CATHETERIZATION WITH CORONARY ANGIOGRAM;  Surgeon: Sherren Mocha, MD;  Location: [facility] CATH LAB;  Service: Cardiovascular;  Laterality: N/A;   PERCUTANEOUS CORONARY STENT INTERVENTION (PCI-S) Right 09/08/2011   Procedure: PERCUTANEOUS CORONARY STENT INTERVENTION (PCI-S);  Surgeon: Sherren Mocha, MD;  Location: [facility] CATH LAB;  Service: Cardiovascular;  Laterality: Right;   POLYPECTOMY  01/20/2021   Procedure: POLYPECTOMY;  Surgeon: Harvel Quale, MD;  [facility];  Service: Gastroenterology;;   ROBOT ASSISTED LAPAROSCOPIC RADICAL PROSTATECTOMY      SOCIAL HISTORY: Social History   Socioeconomic History   Marital status: Widowed    Spouse name: Not on file   Number of children: Not on file   Years of education: Not on file   Highest education level: Not on file  Occupational History   Occupation:  semi-retired  Tobacco Use   Smoking status: Former    Types: Cigars    Passive exposure: Past   Smokeless tobacco: Never   Tobacco comments:    "quit smoking in ~ 1978"  Vaping Use   Vaping Use: Never used  Substance and Sexual Activity   Alcohol use: Yes    Comment: occasionally    Drug use: No   Sexual activity: Not Currently  Other Topics Concern   Not on file  Social History Narrative   Not on file   Social Determinants of Health   Financial Resource Strain: Low Risk  (01/07/2022)   Overall Financial Resource Strain (CARDIA)    Difficulty of Paying Living  Expenses: Not hard at all  Food Insecurity: No Food Insecurity (02/04/2022)   Hunger Vital Sign    Worried About Running Out of Food in the Last Year: Never true    Ran Out of Food in the Last Year: Never true  Transportation Needs: No Transportation Needs (02/04/2022)   PRAPARE - Hydrologist (Medical): No    Lack of Transportation (Non-Medical): No  Physical Activity: Not on file  Stress: Not on file  Social Connections: Not on file  Intimate Partner Violence: Not At Risk (02/04/2022)   Humiliation, Afraid, Rape, and Kick questionnaire    Fear of Current or Ex-Partner: No    Emotionally Abused: No    Physically Abused: No    Sexually Abused: No    FAMILY HISTORY: Family History  Problem Relation Age of Onset   Cancer Father 31       died   Stroke Mother 35       died    ALLERGIES:  is allergic to codeine and pollen extract.  MEDICATIONS:  Current Outpatient Medications  Medication Sig Dispense Refill   amLODipine (NORVASC) 10 MG tablet Take 10 mg by mouth in the morning.     aspirin EC 81 MG tablet Take 81 mg by mouth every evening. 30 tablet    atorvastatin (LIPITOR) 40 MG tablet Take 0.5 tablets (20 mg total) by mouth every evening. 45 tablet 3   BLACK CURRANT SEED OIL PO Take 1 each by mouth 2 (two) times a week. 1 teaspoonful twice weekly     carvedilol (COREG) 6.25 MG tablet Take 1 tablet (6.25 mg total) by mouth 2 (two) times daily. 180 tablet 3   cetirizine (ZYRTEC) 10 MG tablet Take 10 mg by mouth every evening.     cholecalciferol (VITAMIN D3) 25 MCG (1000 UNIT) tablet Take 1,000 Units by mouth every evening.     clopidogrel (PLAVIX) 75 MG tablet Take 1 tablet (75 mg total) by mouth in the morning. 90 tablet 3   Coenzyme Q10 300 MG CAPS Take 300 mg by mouth every evening.     cyanocobalamin (,VITAMIN B-12,) 1000 MCG/ML injection Inject 1,000 mcg into the muscle See admin instructions. Inject (1000 mcg) intramuscularly once monthly as  needed for B-12 deficiency.  2   fenofibrate 160 MG tablet Take 1 tablet (160 mg total) by mouth daily. (Patient taking differently: Take 160 mg by mouth every evening.) 30 tablet 11   FEROSUL 325 (65 Fe) MG tablet TAKE 1 TABLET(325 MG) BY MOUTH TWICE DAILY WITH A MEAL 180 tablet 3   gabapentin (NEURONTIN) 600 MG tablet Take 600 mg by mouth 3 (three) times daily.  3   GLUCOSAMINE-CHONDROITIN PO Take 1 tablet by mouth every evening.     isosorbide  mononitrate (IMDUR) 60 MG 24 hr tablet Take 1.5 tablets (90 mg total) by mouth daily. (Patient taking differently: Take 90 mg by mouth every evening.) 135 tablet 3   levothyroxine (SYNTHROID, LEVOTHROID) 150 MCG tablet Take 150 mcg by mouth daily before breakfast.     meclizine (ANTIVERT) 25 MG tablet Take 1 tablet (25 mg total) by mouth 2 (two) times daily as needed for dizziness. (Patient taking differently: Take 12.5 mg by mouth at bedtime as needed for dizziness.) 30 tablet 0   Omega-3 Fatty Acids (FISH OIL) 1000 MG CAPS Take 1 capsule by mouth in the morning.     Polyethyl Glycol-Propyl Glycol (LUBRICANT EYE DROPS) 0.4-0.3 % SOLN Place 1-2 drops into both eyes 3 (three) times daily as needed (dry/irritated eyes.).     psyllium (METAMUCIL) 58.6 % powder Take 1 packet by mouth 2 (two) times a week.     traMADol (ULTRAM) 50 MG tablet Take 50 mg by mouth 3 (three) times daily.  1   celecoxib (CELEBREX) 200 MG capsule Take 200 mg by mouth in the morning. (Patient not taking: Reported on 02/04/2022)     nitroGLYCERIN (NITROSTAT) 0.4 MG SL tablet Place 1 tablet (0.4 mg total) under the tongue every 5 (five) minutes as needed for chest pain. (Patient not taking: Reported on 02/04/2022) 25 tablet 11   No current facility-administered medications for this visit.    REVIEW OF SYSTEMS:   Constitutional: Denies fevers, chills or abnormal night sweats Eyes: Denies blurriness of vision, double vision or watery eyes Ears, nose, mouth, throat, and face: Denies  mucositis or sore throat Respiratory: Denies cough, dyspnea or wheezes Cardiovascular: Denies palpitation, chest discomfort or lower extremity swelling Gastrointestinal:  Denies nausea, heartburn or change in bowel habits Skin: Denies abnormal skin rashes Lymphatics: Denies new lymphadenopathy or easy bruising Neurological: Positive for numbness in the hands, feet, back of legs. Behavioral/Psych: Mood is stable, no new changes  All other systems were reviewed with the patient and are negative.  PHYSICAL EXAMINATION: ECOG PERFORMANCE STATUS: 1 - Symptomatic but completely ambulatory  Vitals:   02/04/22 0823  BP: (!) 132/58  Pulse: 63  Resp: 16  Temp: 98.4 F (36.9 C)  SpO2: 96%   Filed Weights   02/04/22 0823  Weight: 219 lb 4.8 oz (99.5 kg)    GENERAL:alert, no distress and comfortable SKIN: skin color, texture, turgor are normal, no rashes or significant lesions EYES: normal, conjunctiva are pink and non-injected, sclera clear OROPHARYNX:no exudate, no erythema and lips, buccal mucosa, and tongue normal  NECK: supple, thyroid normal size, non-tender, without nodularity LYMPH:  no palpable lymphadenopathy in the cervical, axillary or inguinal LUNGS: clear to auscultation and percussion with normal breathing effort HEART: regular rate & rhythm and no murmurs and no lower extremity edema ABDOMEN:abdomen soft, non-tender and normal bowel sounds Musculoskeletal:no cyanosis of digits and no clubbing  PSYCH: alert & oriented x 3 with fluent speech NEURO: no focal motor/sensory deficits  LABORATORY DATA:  I have reviewed the data as listed Lab Results  Component Value Date   WBC 5.3 02/04/2022   HGB 10.4 (L) 02/04/2022   HCT 31.7 (L) 02/04/2022   MCV 101.9 (H) 02/04/2022   PLT 186 02/04/2022     Chemistry      Component Value Date/Time   NA 139 02/04/2022 1007   NA 145 (H) 11/11/2021 0000   K 4.3 02/04/2022 1007   CL 104 02/04/2022 1007   CO2 26 02/04/2022 1007  BUN 20 02/04/2022 1007   BUN 19 11/11/2021 0000   CREATININE 1.35 (H) 02/04/2022 1007   CREATININE 1.69 (H) 12/14/2021 1124      Component Value Date/Time   CALCIUM 8.8 (L) 02/04/2022 1007   ALKPHOS 40 02/04/2022 1007   AST 27 02/04/2022 1007   ALT 27 02/04/2022 1007   BILITOT 0.6 02/04/2022 1007   BILITOT 0.3 10/06/2021 1542       RADIOGRAPHIC STUDIES: I have personally reviewed the radiological images as listed and agreed with the findings in the report. No results found.  ASSESSMENT:  1.  T2 N0 bladder cancer (multifocal): - 12/16/2014: Initial diagnosis, malignant neoplasm of lateral wall of bladder - 03/14/2018: TURBT (pathology-invasive papillary urothelial carcinoma, high-grade, tumor involves lamina propria, muscularis propria present, not involved by tumor) - 05/04/2018 - 06/13/2018: Initial BCG induction course - 08/22/2018: Bladder biopsy HG Ta, multifocal - 08/29/2018 - 10/01/2018: Intravesicular valrubicin - 02/20/2019: Bladder biopsy-benign - 09/15/2019: TURBT- HG Ta, muscle present - 10/24/2019 - 11/28/2019: Completion of BCG induction course x 6 - 05/14/2020: TURBT-HG Ta, muscle present - 07/02/2020 - 08/20/2020: Intravesical gemcitabine/docetaxel - 03/10/2021: TURBT-HG Ta + CIS, muscle present - 04/29/2021 - 06/03/2021: Reinduction intravesical gemcitabine/docetaxel - 07/06/2021: Cystoscopy without any visible tumors, cytology with high-grade urothelial carcinoma - 12/30/2021: Progression with lesions throughout the bladder, muscle invasive at left lateral wall  2.  Social/family history: - He lives at home by himself.  He is seen today with his daughter.  He is independent of ADLs and IADLs.  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  He dances 1-2 times per week.  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  Quit smoking 50 years ago. - Father had prostate cancer.  Maternal grandmother had cancer, type unknown to the patient.  PLAN:  1.  T2 N0 bladder  cancer: - He has chronic neuropathy, CKD which precludes him from cisplatin-based neoadjuvant chemotherapy followed by cystectomy. - We discussed trimodality therapy (chemoradiation therapy followed by close cystoscopic surveillance).  He will be a candidate for continuous 5-FU during first week and fourth week, and Mitomycin-C on day 1 along with radiation. - He has an appointment to see radiation doctors at both [facility] and [facility] to get their opinion.  On his prior scan there is bowel that approaches his bladder that may limit ability to give curative dosing.  He does not have any recent scans. - Will schedule for a CT CAP and bone scan. - Will see him back after the scans.  If he chooses trimodality therapy, will need port placement.  2.  Normocytic to microcytic anemia: - CBC today shows hemoglobin 10.4.  Ferritin is 93 and percent saturation is 32. - Recommend Feraheme weekly x 2.  We discussed side effects in detail.   Orders Placed This Encounter  Procedures   NM Bone Scan Whole Body    Standing Status:   Future    Standing Expiration Date:   02/04/2023    Order Specific Question:   If indicated for the ordered procedure, I authorize the administration of a radiopharmaceutical per Radiology protocol    Answer:   Yes    Order Specific Question:   Preferred imaging location?    Answer:   [facility]    Order Specific Question:   Release to patient    Answer:   Immediate   CBC with Differential    Standing Status:   Future  Number of Occurrences:   1    Standing Expiration Date:   02/04/2023   Comprehensive metabolic panel    Standing Status:   Future    Number of Occurrences:   1    Standing Expiration Date:   02/04/2023   Ferritin    Standing Status:   Future    Number of Occurrences:   1    Standing Expiration Date:   02/04/2023   Iron and TIBC ([facility] ONLY)    Standing Status:   Future    Number of Occurrences:   1     Standing Expiration Date:   02/05/2023    All questions were answered. The patient knows to call the clinic with any problems, questions or concerns.      Derek Jack, MD 02/04/2022 5:51 PM

## 2022-02-05 DIAGNOSIS — R2231 Localized swelling, mass and lump, right upper limb: Secondary | ICD-10-CM | POA: Diagnosis not present

## 2022-02-05 DIAGNOSIS — M67431 Ganglion, right wrist: Secondary | ICD-10-CM | POA: Diagnosis not present

## 2022-02-08 ENCOUNTER — Ambulatory Visit (HOSPITAL_COMMUNITY)
Admission: RE | Admit: 2022-02-08 | Discharge: 2022-02-08 | Disposition: A | Discharge status: Home / Self Care | Payer: Medicare Other | Source: Ambulatory Visit | Attending: Unknown Physician Specialty | Admitting: Unknown Physician Specialty

## 2022-02-08 ENCOUNTER — Ambulatory Visit (HOSPITAL_COMMUNITY): Admission: RE | Admit: 2022-02-08 | Payer: Medicare Other | Source: Ambulatory Visit

## 2022-02-08 DIAGNOSIS — K5730 Diverticulosis of large intestine without perforation or abscess without bleeding: Secondary | ICD-10-CM | POA: Diagnosis not present

## 2022-02-08 DIAGNOSIS — C672 Malignant neoplasm of lateral wall of bladder: Secondary | ICD-10-CM | POA: Diagnosis not present

## 2022-02-08 DIAGNOSIS — R911 Solitary pulmonary nodule: Secondary | ICD-10-CM | POA: Diagnosis not present

## 2022-02-08 DIAGNOSIS — N2889 Other specified disorders of kidney and ureter: Secondary | ICD-10-CM | POA: Diagnosis not present

## 2022-02-08 DIAGNOSIS — J9811 Atelectasis: Secondary | ICD-10-CM | POA: Diagnosis not present

## 2022-02-08 DIAGNOSIS — N3289 Other specified disorders of bladder: Secondary | ICD-10-CM | POA: Diagnosis not present

## 2022-02-08 MED ORDER — IOHEXOL 350 MG/ML SOLN
75.0000 mL | Freq: Once | INTRAVENOUS | Status: AC | PRN
  Administered 2022-02-08: 75 mL via INTRAVENOUS

## 2022-02-09 ENCOUNTER — Encounter (HOSPITAL_COMMUNITY)
Admission: RE | Admit: 2022-02-09 | Discharge: 2022-02-09 | Disposition: A | Discharge status: Home / Self Care | Payer: Medicare Other | Source: Ambulatory Visit | Attending: Hematology | Admitting: Hematology

## 2022-02-09 DIAGNOSIS — C678 Malignant neoplasm of overlapping sites of bladder: Secondary | ICD-10-CM | POA: Insufficient documentation

## 2022-02-09 DIAGNOSIS — Z8551 Personal history of malignant neoplasm of bladder: Secondary | ICD-10-CM | POA: Diagnosis not present

## 2022-02-09 DIAGNOSIS — M5136 Other intervertebral disc degeneration, lumbar region: Secondary | ICD-10-CM | POA: Diagnosis not present

## 2022-02-09 DIAGNOSIS — M47815 Spondylosis without myelopathy or radiculopathy, thoracolumbar region: Secondary | ICD-10-CM | POA: Diagnosis not present

## 2022-02-09 MED ORDER — TECHNETIUM TC 99M MEDRONATE IV KIT
20.0000 | PACK | Freq: Once | INTRAVENOUS | Status: AC | PRN
  Administered 2022-02-09: 18.5 via INTRAVENOUS

## 2022-02-10 ENCOUNTER — Inpatient Hospital Stay: Payer: Medicare Other

## 2022-02-10 DIAGNOSIS — Z885 Allergy status to narcotic agent status: Secondary | ICD-10-CM | POA: Diagnosis not present

## 2022-02-10 DIAGNOSIS — I10 Essential (primary) hypertension: Secondary | ICD-10-CM | POA: Diagnosis not present

## 2022-02-10 DIAGNOSIS — E119 Type 2 diabetes mellitus without complications: Secondary | ICD-10-CM | POA: Diagnosis not present

## 2022-02-10 DIAGNOSIS — C679 Malignant neoplasm of bladder, unspecified: Secondary | ICD-10-CM | POA: Diagnosis not present

## 2022-02-10 DIAGNOSIS — C672 Malignant neoplasm of lateral wall of bladder: Secondary | ICD-10-CM | POA: Diagnosis not present

## 2022-02-10 NOTE — Progress Notes (Signed)
GU Location of Tumor / Histology: Bladder Cancer lesions throughout the bladder.  Muscle invasive at left later wall  Additional Dx: Malignant neoplasm of prostate  Biopsy:  01/06/2022    02/08/2022 Dr. Roslyn Smiling CT Abdomen Pelvis with/without Contrast CLINICAL DATA:  History of prostate cancer, follow-up for cystoscopy which showed lesions throughout the bladder and muscle invasive disease. * Tracking Code: BO *  FINDINGS: Lower chest: Lung bases are clear. No effusion. Tiny LEFT lower lobe pulmonary nodule unchanged compared to previous imaging 3 mm (image 7/4) no effusion. No consolidative changes.   Hepatobiliary: Mild hepatic steatosis is suggested. No focal, suspicious hepatic lesion. Fissural widening of hepatic fissures. No pericholecystic stranding or biliary duct dilation. Portal vein is patent.   Pancreas: Pancreatic atrophy without signs of inflammation or ductal dilation similar to previous imaging.   Spleen: Normal.   Adrenals/Urinary Tract: Adrenal glands are normal.   Mild cortical scarring of the bilateral kidneys. No signs of abnormal enhancement along the course of LEFT or RIGHT ureter. No suspicious renal lesion with stable Bosniak category II cyst arising upper pole the RIGHT kidney. This measures approximately 4.8 cm. Smaller cyst in the lower pole also compatible with benign renal cyst.   Perivesical stranding with irregular thickening of the RIGHT greater than LEFT urinary bladder wall. No discrete masslike lesion. Mildly heterogeneous enhancement. Post prostatectomy.   Stomach/Bowel: Colonic diverticulosis and diverticular disease worse in the sigmoid similar to prior imaging. No acute gastrointestinal findings. Normal appendix.   Vascular/Lymphatic:   Aortic atherosclerosis. No sign of aneurysm. Smooth contour of the IVC. There is no gastrohepatic or hepatoduodenal ligament lymphadenopathy. No retroperitoneal or mesenteric lymphadenopathy.   No pelvic  sidewall lymphadenopathy.   Reproductive: Post prostatectomy.   Other: No ascites.   Musculoskeletal: No acute bone finding. No destructive bone process.  Spinal degenerative changes.   Excretory phase: Urinary bladder is incompletely opacified on excretory phase. Mildly patulous appearance of the distal LEFT ureter also incompletely opacified. Peripelvic stranding on the LEFT. No filling defect in the upper tracts to indicate upper tract disease. Bifid renal pelvis on the RIGHT.   IMPRESSION: 1. Perivesical stranding with irregular thickening of the RIGHT greater than LEFT urinary bladder wall. No discrete masslike lesion.  Mildly heterogeneous enhancement of the urinary bladder. Findings may be related to post treatment related changes. Correlate with any signs of cystitis. Cystoscopic correlation may also be helpful if not recently performed. 2. No filling defect in the upper tracts to indicate upper tract disease. No secondary sign of stricture. 3. Post prostatectomy. 4. Mild hepatic steatosis is suggested. Fissural widening of hepatic fissures. Correlate with any clinical or laboratory evidence of liver disease. 5. Colonic diverticulosis and diverticular disease worse in the sigmoid similar to prior imaging. 6. Tiny LEFT lower lobe pulmonary nodule unchanged compared to previous imaging 3 mm. Compatible with benign pulmonary nodule. 7. Aortic atherosclerosis.   11/02/2019 Dr. Tresa Endo III CT Abdomen Pelvis with/without Contrast CLINICAL DATA:  Follow-up bladder cancer. History of prostate cancer status post prostatectomy.  FINDINGS: Lower chest: Stable nodule within the right middle lobe measuring 5 mm, image 4/3. This is compatible with a benign abnormality. No acute findings noted within the imaged portions of the lower lungs.   Hepatobiliary: No focal liver abnormality is seen. No gallstones, gallbladder wall thickening, or biliary dilatation.   Pancreas: Unremarkable. No  pancreatic ductal dilatation or surrounding inflammatory changes.   Spleen: Normal in size without focal abnormality.   Adrenals/Urinary Tract: Adrenal  glands are unremarkable.   Simple appearing cyst arising from the lateral cortex of the right kidney measures 5 cm, image 37/6. Simple cyst within the inferior pole of right kidney measures 2.4 cm. No solid enhancing kidney lesions. No suspicious filling defects identified within the collecting systems bilaterally. The ureters appear normal.  Asymmetric right-sided bladder wall thickening measures up to 9 mm, image 59/8 and image 72/10.   Stomach/Bowel: Stomach is within normal limits. Appendix appears normal. No evidence of bowel wall thickening, distention, or inflammatory changes. Extensive distal colonic diverticulosis identified without acute inflammation.   Vascular/Lymphatic: Aortic atherosclerosis. No aneurysm. No abdominal adenopathy. No pelvic adenopathy.   Reproductive: Prostate gland appears surgically absent.   Other: No free fluid or fluid collections.   Musculoskeletal: Small dense sclerotic lesion in the left iliac bone measures 4 mm and is favored to represent a benign bone island.  Spondylosis identified within the lumbar spine. No acute or suspicious osseous findings.   IMPRESSION: 1. Status post prostatectomy. No findings of nodal metastasis or solid organ metastasis within the abdomen or pelvis. 2. Asymmetric right-sided bladder wall thickening measuring up to 9 mm. Cannot rule out underlying residual/recurrent urothelial neoplasm. 3. Aortic atherosclerosis. 4. Right kidney cysts.   02/09/2022 Dr. Delton Coombes NM Bone Scan Whole Body CLINICAL DATA:  History of bladder cancer for 3 years.  FINDINGS: Adequate uptake is noted throughout the bony skeleton. Mild increased activity is noted in the medial left knee joint consistent with degenerative change. No focal area of increased activity is identified to suggest  metastatic disease. Mild degenerative changes in the thoracolumbar spine are seen as well.   IMPRESSION: No evidence of metastatic disease. Mild degenerative changes are noted as described   Past/Anticipated interventions by urology, if any:   Bladder biopsy 02/20/2019 by Dr. Nelma Rothman III.  Past/Anticipated interventions by medical oncology, if any:    Dr. Delton Coombes    Weight changes, if any: No  IPSS:  11  Bowel/Bladder complaints, if any:  Constipation due to iron pills is taking Metamucil.  Urinary frequency and urgency.  Nausea/Vomiting, if any: No  Pain issues, if any:  0/10  SAFETY ISSUES: Prior radiation? No Pacemaker/ICD? No Possible current pregnancy? Male Is the patient on methotrexate? No  Current Complaints / other details:  Chemotherapy (07/02/2020-8/102022) Introduction of intravesical gemcitabine/docetaxel. Reintroduction intravesical gemcitabine/docetaxel 04/29/2021-06/03/2021. 07/06/2021 Cysto without any visible tumors, Cytology with high grade urothelial carcinoma.

## 2022-02-11 DIAGNOSIS — C678 Malignant neoplasm of overlapping sites of bladder: Secondary | ICD-10-CM | POA: Diagnosis not present

## 2022-02-11 DIAGNOSIS — Z885 Allergy status to narcotic agent status: Secondary | ICD-10-CM | POA: Diagnosis not present

## 2022-02-11 DIAGNOSIS — C679 Malignant neoplasm of bladder, unspecified: Secondary | ICD-10-CM | POA: Diagnosis not present

## 2022-02-12 ENCOUNTER — Other Ambulatory Visit: Payer: Self-pay

## 2022-02-12 ENCOUNTER — Ambulatory Visit
Admission: RE | Admit: 2022-02-12 | Discharge: 2022-02-12 | Disposition: A | Discharge status: Home / Self Care | Payer: Medicare Other | Source: Ambulatory Visit | Attending: Radiation Oncology | Admitting: Radiation Oncology

## 2022-02-12 VITALS — BP 144/65 | HR 55 | Temp 97.0°F | Resp 18 | Ht 73.0 in | Wt 220.2 lb

## 2022-02-12 DIAGNOSIS — Z8546 Personal history of malignant neoplasm of prostate: Secondary | ICD-10-CM | POA: Diagnosis not present

## 2022-02-12 DIAGNOSIS — N281 Cyst of kidney, acquired: Secondary | ICD-10-CM | POA: Diagnosis not present

## 2022-02-12 DIAGNOSIS — M5459 Other low back pain: Secondary | ICD-10-CM | POA: Insufficient documentation

## 2022-02-12 DIAGNOSIS — R918 Other nonspecific abnormal finding of lung field: Secondary | ICD-10-CM | POA: Insufficient documentation

## 2022-02-12 DIAGNOSIS — M47814 Spondylosis without myelopathy or radiculopathy, thoracic region: Secondary | ICD-10-CM | POA: Insufficient documentation

## 2022-02-12 DIAGNOSIS — Z809 Family history of malignant neoplasm, unspecified: Secondary | ICD-10-CM | POA: Insufficient documentation

## 2022-02-12 DIAGNOSIS — E039 Hypothyroidism, unspecified: Secondary | ICD-10-CM | POA: Insufficient documentation

## 2022-02-12 DIAGNOSIS — D509 Iron deficiency anemia, unspecified: Secondary | ICD-10-CM | POA: Insufficient documentation

## 2022-02-12 DIAGNOSIS — C672 Malignant neoplasm of lateral wall of bladder: Secondary | ICD-10-CM | POA: Diagnosis not present

## 2022-02-12 DIAGNOSIS — Z79890 Hormone replacement therapy (postmenopausal): Secondary | ICD-10-CM | POA: Diagnosis not present

## 2022-02-12 DIAGNOSIS — K760 Fatty (change of) liver, not elsewhere classified: Secondary | ICD-10-CM | POA: Insufficient documentation

## 2022-02-12 DIAGNOSIS — I10 Essential (primary) hypertension: Secondary | ICD-10-CM | POA: Insufficient documentation

## 2022-02-12 DIAGNOSIS — I2510 Atherosclerotic heart disease of native coronary artery without angina pectoris: Secondary | ICD-10-CM | POA: Diagnosis not present

## 2022-02-12 DIAGNOSIS — Z7982 Long term (current) use of aspirin: Secondary | ICD-10-CM | POA: Insufficient documentation

## 2022-02-12 DIAGNOSIS — Z9221 Personal history of antineoplastic chemotherapy: Secondary | ICD-10-CM | POA: Insufficient documentation

## 2022-02-12 DIAGNOSIS — R072 Precordial pain: Secondary | ICD-10-CM | POA: Insufficient documentation

## 2022-02-12 DIAGNOSIS — Z87891 Personal history of nicotine dependence: Secondary | ICD-10-CM | POA: Diagnosis not present

## 2022-02-12 DIAGNOSIS — G629 Polyneuropathy, unspecified: Secondary | ICD-10-CM | POA: Diagnosis not present

## 2022-02-12 DIAGNOSIS — Z79899 Other long term (current) drug therapy: Secondary | ICD-10-CM | POA: Diagnosis not present

## 2022-02-12 DIAGNOSIS — K219 Gastro-esophageal reflux disease without esophagitis: Secondary | ICD-10-CM | POA: Insufficient documentation

## 2022-02-12 DIAGNOSIS — K429 Umbilical hernia without obstruction or gangrene: Secondary | ICD-10-CM | POA: Diagnosis not present

## 2022-02-12 DIAGNOSIS — I700 Atherosclerosis of aorta: Secondary | ICD-10-CM | POA: Diagnosis not present

## 2022-02-12 DIAGNOSIS — E785 Hyperlipidemia, unspecified: Secondary | ICD-10-CM | POA: Diagnosis not present

## 2022-02-12 NOTE — Progress Notes (Signed)
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 Malignant neoplasm of lateral wall of urinary bladder ([place])  C67.2 Ambulatory referral to Urology      HISTORY OF PRESENT ILLNESS: [name] is a 77 y.o. male seen at the request of Dr. Roslyn Smiling for bladder cancer. He also has a history of stage p(T3a, N0) prostate cancer s/p RALP in 2008, as well as iron deficiency anemia. His bladder cancer dates back to at least 11/2009, at which time he underwent TURBT for a noninvasive low grade papillary urothelial carcinoma, under the care of Dr. Lawerance Bach. He continued in surveillance with Dr. Rosana Hoes thereafter and a bladder biopsy in 12/2015 was benign. He developed a recurrence in 03/2018, and pathology showed invasive high grade urothelial carcinoma involving lamina propria but not into the muscle. He received BCG from 05/04/18 through 06/13/18. Unfortunately, repeat bladder biopsies in 08/2018 confirmed residual multifocal high grade disease. He was then treated with chemotherapy with valrubicin from 08/29/18 through 10/01/18. He again developed a recurrence in 09/2019, undergoing TURBT that showed noninvasive high grade urothelial carcinoma. He was again given induction therapy with six doses of BCG from 10/24/19 through 11/28/19. Despite treatment, he continued to have residual disease based on TURBT in 05/2020. He was subsequently treated with gemcitabine/docetaxel installations from 07/02/20 through 08/20/20. He was again found to have recurrent disease on TURBT in 02/2021 treated with  re-induction gemcitabine/docetaxel, which he received 04/29/21 through 06/03/21 under the care of Dr. Warrick Parisian at [facility]. While a surveillance cystoscopy in 06/2021 did not show any visible tumors, his urine cytology confirmed urothelial carcinoma. He missed several appointments but returned in 09/2021 and was restarted on maintenance gemcitabine/docetaxel instillations.  On his most recent cystoscopy/TURBT with Dr. Dyanne Iha on 12/30/21, he was noted to have lesions throughout the bladder. Final pathology confirmed that the cancer had transitioned to muscle invasive, HG urothelial carcinoma in the left lateral bladder wall. He has met with the oncology team at [facility] (Dr. Roslyn Smiling, Dr. Dyanne Iha, Dr. Tharon Aquas), who do not feel he is an ideal candidate for tri-modal therapy and therefore recommend maximal TURBT +/- chemoRT. Since he lives in [place], he prefers to have his care consolidated to [facility] providers in [place] and therefore referred to Dr. Delton Coombes on 02/04/22, who recommends continuous 5-FU during weeks one and four with mitomycin on day 1, concurrent with daily radiation therapy.  He underwent disease staging CT C/A/P on 02/08/22 that showed bladder thickening without discrete masslike lesion with mildly heterogeneous enhancement of the urinary bladder which is likely related to post-treatment changes. There was no evidence of metastatic disease in the chest, abdomen or pelvis. A staging bone scan, performed on 02/09/22 confirmed no evidence of osseous metastatic disease.  He has kindly been referred to [place] today to discuss the radiation treatment options in the management of muscle invasive bladder cancer.  PREVIOUS RADIATION THERAPY: No  PAST MEDICAL HISTORY:  Past Medical History:  Diagnosis Date   Anginal pain ([place])    Arthritis    "all over"   Bladder  tumor    Chronic lower back pain    Coronary artery disease    a. s/p CABG x 2 (LIMA->LAD, RIMA->RCA);  b. s/p  multiple PCI's to Ramus;  c. 08/2011 Cath/PCI: LM 70% into ramus with 70-80% there->treated wtih 3.5x18 Xience Xpedition DES, LCX  nonobs, RCA occluded.  RIMA & LIMA patent, EF 55-65%   DJD (degenerative joint disease)    GERD (gastroesophageal reflux disease)    History of gout    Hyperlipidemia    Hypertension    Hypothyroidism    Neuropathy    OSA on CPAP    Prostate cancer ([place])       PAST SURGICAL HISTORY: Past Surgical History:  Procedure Laterality Date   ABDOMINAL HERNIA REPAIR     BIOPSY  11/26/2015   Procedure: BIOPSY;  Surgeon: Rogene Houston, MD;  [facility];  Service: Endoscopy;;  gastric esophagus   BIOPSY  01/20/2021   Procedure: BIOPSY;  Surgeon: Harvel Quale, MD;  [facility];  Service: Gastroenterology;;   BIOPSY  10/28/2021   Procedure: BIOPSY;  Surgeon: Harvel Quale, MD;  [facility];  Service: Gastroenterology;;   BLADDER TUMOR EXCISION     BROW LIFT Bilateral 04/27/2019   Procedure: BILATERAL BLEPHAROPLASTY;  Surgeon: Baruch Goldmann, MD;  Location: [facility];  Service: Ophthalmology;  Laterality: Bilateral;   CARDIAC CATHETERIZATION N/A 04/10/2015   Procedure: Left Heart Cath and Cors/Grafts Angiography;  Surgeon: Sherren Mocha, MD;  Location: [place] [facility];  Service: Cardiovascular;  Laterality: N/A;   CARDIAC CATHETERIZATION N/A 04/10/2015   Procedure: Coronary Stent Intervention;  Surgeon: Sherren Mocha, MD;  Location: [place] [facility];  Service: Cardiovascular;  Laterality: N/A;   CATARACT EXTRACTION W/PHACO Right 06/23/2015   Procedure: CATARACT EXTRACTION PHACO AND INTRAOCULAR LENS PLACEMENT RIGHT EYE; CDE:  7.08;  Surgeon: Tonny Branch, MD;  Location: [facility];  Service: Ophthalmology;  Laterality: Right;   CATARACT EXTRACTION W/PHACO Left 08/25/2015   Procedure: CATARACT EXTRACTION PHACO AND INTRAOCULAR LENS PLACEMENT LEFT EYE; CDE:  8.18;  Surgeon: Tonny Branch, MD;  Location: [facility];  Service:  Ophthalmology;  Laterality: Left;   COLONOSCOPY N/A 12/28/2012   Procedure: COLONOSCOPY;  Surgeon: Rogene Houston, MD;  [facility];  Service: Endoscopy;  Laterality: N/A;  830 rescheduled   COLONOSCOPY WITH PROPOFOL N/A 01/20/2021   Procedure: COLONOSCOPY WITH PROPOFOL;  Surgeon: Harvel Quale, MD;  [facility];  Service: Gastroenterology;  Laterality: N/A;  11:05   CORONARY ANGIOPLASTY WITH STENT PLACEMENT  2013; 04/10/2015   "this makes me a total of 7" (04/10/2015)   CORONARY ARTERY BYPASS GRAFT  1997   with (LIMA)   ESOPHAGEAL DILATION N/A 11/26/2015   Procedure: ESOPHAGEAL DILATION;  Surgeon: Rogene Houston, MD;  [facility];  Service: Endoscopy;  Laterality: N/A;   ESOPHAGEAL DILATION N/A 05/23/2017   Procedure: ESOPHAGEAL DILATION;  Surgeon: Rogene Houston, MD;  [facility];  Service: Endoscopy;  Laterality: N/A;   ESOPHAGEAL DILATION N/A 06/16/2017   Procedure: ESOPHAGEAL DILATION;  Surgeon: Rogene Houston, MD;  [facility];  Service: Endoscopy;  Laterality: N/A;   ESOPHAGEAL DILATION  10/28/2021   Procedure: ESOPHAGEAL DILATION;  Surgeon: Harvel Quale, MD;  [facility];  Service: Gastroenterology;;   ESOPHAGOGASTRODUODENOSCOPY N/A 11/26/2015   Procedure: ESOPHAGOGASTRODUODENOSCOPY (EGD);  Surgeon: Rogene Houston, MD;  [facility];  Service: Endoscopy;  Laterality: N/A;  1:25   ESOPHAGOGASTRODUODENOSCOPY N/A 05/23/2017   Procedure: ESOPHAGOGASTRODUODENOSCOPY (EGD);  Surgeon: Rogene Houston, MD;  [facility];  Service: Endoscopy;  Laterality: N/A;  1:15   ESOPHAGOGASTRODUODENOSCOPY N/A 06/16/2017   Procedure: ESOPHAGOGASTRODUODENOSCOPY (EGD);  Surgeon: Rogene Houston, MD;  [facility];  Service: Endoscopy;  Laterality: N/A;  1240   ESOPHAGOGASTRODUODENOSCOPY (EGD) WITH PROPOFOL N/A 01/20/2021   Procedure: ESOPHAGOGASTRODUODENOSCOPY (EGD) WITH PROPOFOL;   Surgeon: Harvel Quale, MD;  [facility];  Service: Gastroenterology;  Laterality: N/A;   ESOPHAGOGASTRODUODENOSCOPY (EGD) WITH PROPOFOL N/A 10/28/2021   Procedure: ESOPHAGOGASTRODUODENOSCOPY (EGD) WITH PROPOFOL;  Surgeon: Harvel Quale, MD;  [facility];  Service: Gastroenterology;  Laterality: N/A;  215 ASA 2, pt will arrive at 10:45   HERNIA REPAIR Left    KNEE CARTILAGE SURGERY Bilateral    LEFT HEART CATH AND CORS/GRAFTS ANGIOGRAPHY N/A 09/08/2016   Procedure: LEFT HEART CATH AND CORS/GRAFTS ANGIOGRAPHY;  Surgeon: Martinique, Peter M, MD;  Location: [place] [facility];  Service: Cardiovascular;  Laterality: N/A;   LEFT HEART CATHETERIZATION WITH CORONARY ANGIOGRAM N/A 09/08/2011   Procedure: LEFT HEART CATHETERIZATION WITH CORONARY ANGIOGRAM;  Surgeon: Sherren Mocha, MD;  Location: [facility] C[facility] LAB;  Service: Cardiovascular;  Laterality: N/A;   PERCUTANEOUS CORONARY STENT INTERVENTION (PCI-S) Right 09/08/2011   Procedure: PERCUTANEOUS CORONARY STENT INTERVENTION (PCI-S);  Surgeon: Sherren Mocha, MD;  Location: [facility] CATH LAB;  Service: Cardiovascular;  Laterality: Right;   POLYPECTOMY  01/20/2021   Procedure: POLYPECTOMY;  Surgeon: Harvel Quale, MD;  [facility];  Service: Gastroenterology;;   ROBOT ASSISTED LAPAROSCOPIC RADICAL PROSTATECTOMY      FAMILY HISTORY:  Family History  Problem Relation Age of Onset   Cancer Father 26       died   Stroke Mother 76       died    SOCIAL HISTORY:  Social History   Socioeconomic History   Marital status: Widowed    Spouse name: Not on file   Number of children: Not on file   Years of education: Not on file   Highest education level: Not on file  Occupational History   Occupation: semi-retired  Tobacco Use   Smoking status: Former    Types: Cigars    Passive exposure: Past   Smokeless tobacco: Never   Tobacco comments:    "quit smoking in ~ 1978"  Vaping Use   Vaping Use:  Never used  Substance and Sexual Activity   Alcohol use: Yes    Comment: occasionally    Drug use: No   Sexual activity: Not Currently  Other Topics Concern   Not on file  Social History Narrative   Not on file   Social Determinants of Health   Financial Resource Strain: Low Risk  (01/07/2022)   Overall Financial Resource Strain (CARDIA)    Difficulty of Paying Living Expenses: Not hard at all  Food Insecurity: No Food Insecurity (02/04/2022)   Hunger Vital Sign    Worried About Running Out of Food in the Last Year: Never true    [place] in the Last Year: Never true  Transportation Needs: No Transportation Needs (02/04/2022)   PRAPARE - Hydrologist (Medical): No    Lack of Transportation (Non-Medical): No  Physical Activity: Not on file  Stress: Not on file  Social Connections: Not on file  Intimate Partner Violence: Not At Risk (02/04/2022)   Humiliation, Afraid, Rape,  and Kick questionnaire    Fear of Current or Ex-Partner: No    Emotionally Abused: No    Physically Abused: No    Sexually Abused: No    ALLERGIES: Codeine and Pollen extract  MEDICATIONS:  Current Outpatient Medications  Medication Sig Dispense Refill   amLODipine (NORVASC) 10 MG tablet Take 10 mg by mouth in the morning.     aspirin EC 81 MG tablet Take 81 mg by mouth every evening. 30 tablet    atorvastatin (LIPITOR) 40 MG tablet Take 0.5 tablets (20 mg total) by mouth every evening. 45 tablet 3   BLACK CURRANT SEED OIL PO Take 1 each by mouth 2 (two) times a week. 1 teaspoonful twice weekly     carvedilol (COREG) 6.25 MG tablet Take 1 tablet (6.25 mg total) by mouth 2 (two) times daily. 180 tablet 3   celecoxib (CELEBREX) 200 MG capsule Take 200 mg by mouth in the morning. (Patient not taking: Reported on 02/04/2022)     cetirizine (ZYRTEC) 10 MG tablet Take 10 mg by mouth every evening.     cholecalciferol (VITAMIN D3) 25 MCG (1000 UNIT) tablet Take 1,000 Units by  mouth every evening.     clopidogrel (PLAVIX) 75 MG tablet Take 1 tablet (75 mg total) by mouth in the morning. 90 tablet 3   Coenzyme Q10 300 MG CAPS Take 300 mg by mouth every evening.     cyanocobalamin (,VITAMIN B-12,) 1000 MCG/ML injection Inject 1,000 mcg into the muscle See admin instructions. Inject (1000 mcg) intramuscularly once monthly as needed for B-12 deficiency.  2   fenofibrate 160 MG tablet Take 1 tablet (160 mg total) by mouth daily. (Patient taking differently: Take 160 mg by mouth every evening.) 30 tablet 11   FEROSUL 325 (65 Fe) MG tablet TAKE 1 TABLET(325 MG) BY MOUTH TWICE DAILY WITH A MEAL 180 tablet 3   gabapentin (NEURONTIN) 600 MG tablet Take 600 mg by mouth 3 (three) times daily.  3   GLUCOSAMINE-CHONDROITIN PO Take 1 tablet by mouth every evening.     isosorbide mononitrate (IMDUR) 60 MG 24 hr tablet Take 1.5 tablets (90 mg total) by mouth daily. (Patient taking differently: Take 90 mg by mouth every evening.) 135 tablet 3   levothyroxine (SYNTHROID, LEVOTHROID) 150 MCG tablet Take 150 mcg by mouth daily before breakfast.     meclizine (ANTIVERT) 25 MG tablet Take 1 tablet (25 mg total) by mouth 2 (two) times daily as needed for dizziness. (Patient taking differently: Take 12.5 mg by mouth at bedtime as needed for dizziness.) 30 tablet 0   nitroGLYCERIN (NITROSTAT) 0.4 MG SL tablet Place 1 tablet (0.4 mg total) under the tongue every 5 (five) minutes as needed for chest pain. (Patient not taking: Reported on 02/04/2022) 25 tablet 11   Omega-3 Fatty Acids (FISH OIL) 1000 MG CAPS Take 1 capsule by mouth in the morning.     Polyethyl Glycol-Propyl Glycol (LUBRICANT EYE DROPS) 0.4-0.3 % SOLN Place 1-2 drops into both eyes 3 (three) times daily as needed (dry/irritated eyes.).     psyllium (METAMUCIL) 58.6 % powder Take 1 packet by mouth 2 (two) times a week.     traMADol (ULTRAM) 50 MG tablet Take 50 mg by mouth 3 (three) times daily.  1   No current facility-administered  medications for this encounter.    REVIEW OF SYSTEMS:  On review of systems, the patient reports that he is doing well overall. He denies any chest pain, shortness  of breath, cough, fevers, chills, night sweats, unintended weight changes. He denies any bowel disturbances, and denies abdominal pain, nausea or vomiting. He denies any new musculoskeletal or joint aches or pains. His IPSS score was 11, indicating moderate urinary symptoms. He describes urinary frequency and urgency. A complete review of systems is obtained and is otherwise negative.    PHYSICAL EXAM:  Wt Readings from Last 3 Encounters:  02/12/22 220 lb 4 oz (99.9 kg)  02/04/22 219 lb 4.8 oz (99.5 kg)  12/14/21 219 lb (99.3 kg)   Temp Readings from Last 3 Encounters:  02/12/22 (!) 97 F (36.1 C) (Temporal)  02/04/22 98.4 F (36.9 C) (Oral)  12/14/21 (!) 97.3 F (36.3 C) (Temporal)   BP Readings from Last 3 Encounters:  02/12/22 (!) 144/65  02/04/22 (!) 132/58  12/14/21 107/60   Pulse Readings from Last 3 Encounters:  02/12/22 (!) 55  02/04/22 63  12/14/21 62   Pain Assessment Pain Score: 0-No pain/10  In general this is a well appearing Caucasian male in no acute distress. He's alert and oriented x4 and appropriate throughout the examination. Cardiopulmonary assessment is negative for acute distress and he exhibits normal effort.   KPS = 100  100 - Normal; no complaints; no evidence of disease. 90   - Able to carry on normal activity; minor signs or symptoms of disease. 80   - Normal activity with effort; some signs or symptoms of disease. 77   - Cares for self; unable to carry on normal activity or to do active work. 60   - Requires occasional assistance, but is able to care for most of his personal needs. 50   - Requires considerable assistance and frequent medical care. 43   - Disabled; requires special care and assistance. 67   - Severely disabled; hospital admission is indicated although death not  imminent. 87   - Very sick; hospital admission necessary; active supportive treatment necessary. 10   - Moribund; fatal processes progressing rapidly. 0     - Dead  Karnofsky DA, [employer], [place] LS and Burchenal [facility] [phone]) The use of the nitrogen mustards in the palliative treatment of carcinoma: with particular reference to bronchogenic carcinoma Cancer 1 [id number]  LABORATORY DATA:  Lab Results  Component Value Date   WBC 5.3 02/04/2022   HGB 10.4 (L) 02/04/2022   HCT 31.7 (L) 02/04/2022   MCV 101.9 (H) 02/04/2022   PLT 186 02/04/2022   Lab Results  Component Value Date   NA 139 02/04/2022   K 4.3 02/04/2022   CL 104 02/04/2022   CO2 26 02/04/2022   Lab Results  Component Value Date   ALT 27 02/04/2022   AST 27 02/04/2022   ALKPHOS 40 02/04/2022   BILITOT 0.6 02/04/2022     RADIOGRAPHY: NM Bone Scan Whole Body  Result Date: 02/10/2022 CLINICAL DATA:  History of bladder cancer for 3 years EXAM: NUCLEAR MEDICINE WHOLE BODY BONE SCAN TECHNIQUE: Whole body anterior and posterior images were obtained approximately 3 hours after intravenous injection of radiopharmaceutical. RADIOPHARMACEUTICALS:  18.5 mCi Technetium-66mMDP IV COMPARISON:  01/19/2006 FINDINGS: Adequate uptake is noted throughout the bony skeleton. Mild increased activity is noted in the medial left knee joint consistent with degenerative change. No focal area of increased activity is identified to suggest metastatic disease. Mild degenerative changes in the thoracolumbar spine are seen as well. IMPRESSION: No evidence of metastatic disease. Mild degenerative changes are noted as described. Electronically Signed   By: MElta Guadeloupe  Lukens M.D.   On: 02/10/2022 01:49   CT ABDOMEN PELVIS W WO CONTRAST  Result Date: 02/09/2022 CLINICAL DATA:  History of prostate cancer, follow-up for cystoscopy which showed lesions throughout the bladder and muscle invasive disease. * Tracking Code: BO * EXAM: CT ABDOMEN AND PELVIS WITHOUT AND  WITH CONTRAST TECHNIQUE: Multidetector CT imaging of the abdomen and pelvis was performed following the standard protocol before and following the bolus administration of intravenous contrast. RADIATION DOSE REDUCTION: This exam was performed according to the departmental dose-optimization program which includes automated exposure control, adjustment of the mA and/or kV according to patient size and/or use of iterative reconstruction technique. CONTRAST:  38m OMNIPAQUE IOHEXOL 350 MG/ML SOLN COMPARISON:  November 01, 2019. FINDINGS: Lower chest: Lung bases are clear. No effusion. Tiny LEFT lower lobe pulmonary nodule unchanged compared to previous imaging 3 mm (image 7/4) no effusion. No consolidative changes. Hepatobiliary: Mild hepatic steatosis is suggested. No focal, suspicious hepatic lesion. Fissural widening of hepatic fissures. No pericholecystic stranding or biliary duct dilation. Portal vein is patent. Pancreas: Pancreatic atrophy without signs of inflammation or ductal dilation similar to previous imaging. Spleen: Normal. Adrenals/Urinary Tract: Adrenal glands are normal. Mild cortical scarring of the bilateral kidneys. No signs of abnormal enhancement along the course of LEFT or RIGHT ureter. No suspicious renal lesion with stable Bosniak category II cyst arising upper pole the RIGHT kidney. This measures approximately 4.8 cm. Smaller cyst in the lower pole also compatible with benign renal cyst. Perivesical stranding with irregular thickening of the RIGHT greater than LEFT urinary bladder wall. No discrete masslike lesion. Mildly heterogeneous enhancement. Post prostatectomy. Stomach/Bowel: Colonic diverticulosis and diverticular disease worse in the sigmoid similar to prior imaging. No acute gastrointestinal findings. Normal appendix. Vascular/Lymphatic: Aortic atherosclerosis. No sign of aneurysm. Smooth contour of the IVC. There is no gastrohepatic or hepatoduodenal ligament lymphadenopathy. No  retroperitoneal or mesenteric lymphadenopathy. No pelvic sidewall lymphadenopathy. Reproductive: Post prostatectomy. Other: No ascites. Musculoskeletal: No acute bone finding. No destructive bone process. Spinal degenerative changes. Excretory phase: Urinary bladder is incompletely opacified on excretory phase. Mildly patulous appearance of the distal LEFT ureter also incompletely opacified. Peripelvic stranding on the LEFT. No filling defect in the upper tracts to indicate upper tract disease. Bifid renal pelvis on the RIGHT. IMPRESSION: 1. Perivesical stranding with irregular thickening of the RIGHT greater than LEFT urinary bladder wall. No discrete masslike lesion. Mildly heterogeneous enhancement of the urinary bladder. Findings may be related to post treatment related changes. Correlate with any signs of cystitis. Cystoscopic correlation may also be helpful if not recently performed. 2. No filling defect in the upper tracts to indicate upper tract disease. No secondary sign of stricture. 3. Post prostatectomy. 4. Mild hepatic steatosis is suggested. Fissural widening of hepatic fissures. Correlate with any clinical or laboratory evidence of liver disease. 5. Colonic diverticulosis and diverticular disease worse in the sigmoid similar to prior imaging. 6. Tiny LEFT lower lobe pulmonary nodule unchanged compared to previous imaging 3 mm. Compatible with benign pulmonary nodule. 7. Aortic atherosclerosis. Aortic Atherosclerosis (ICD10-I70.0). Electronically Signed   By: GZetta BillsM.D.   On: 02/09/2022 10:41   CT CHEST W CONTRAST  Result Date: 02/09/2022 CLINICAL DATA:  Prostate cancer. Follow-up for cystoscopy with multiple lesions in bladder. EXAM: CT CHEST WITH CONTRAST CT ABDOMEN WITHOUT AND WITH CONTRAST TECHNIQUE: Multidetector CT imaging of the abdomen was performed without intravenous contrast. Multidetector CT imaging of the chest and abdomen was then performed during bolus administration of  intravenous contrast. RADIATION DOSE REDUCTION: This exam was performed according to the departmental dose-optimization program which includes automated exposure control, adjustment of the mA and/or kV according to patient size and/or use of iterative reconstruction technique. CONTRAST:  42m OMNIPAQUE IOHEXOL 350 MG/ML SOLN COMPARISON:  07/05/2013, 11/02/2019. FINDINGS: CT CHEST WITH CONTRAST Cardiovascular: The heart is normal in size and there is no pericardial effusion. Three-vessel coronary artery calcifications are noted. There is atherosclerotic calcification of the aorta without evidence of aneurysm. The pulmonary trunk is normal in caliber. Mediastinum/Nodes: No mediastinal, hilar, or axillary lymphadenopathy. The thyroid gland, trachea, and esophagus are within normal limits. Lungs/Pleura: Mild atelectasis is present bilaterally. There is a 3 mm nodule in the left lower lobe, axial image 113, unchanged. There is a 6 mm nodule in the right middle lobe, axial image 87, unchanged from 2015 and likely benign. No effusion or pneumothorax. Musculoskeletal: Sternotomy wires are noted. Degenerative changes in the thoracic spine. No acute or suspicious osseous abnormality. Confluence osteophytes are present along the anterior longitudinal ligament which may be associated with diffuse idiopathic skeletal hyperostosis. CT ABDOMEN WITHOUT AND WITH CONTRAST Hepatobiliary: No focal liver abnormality is seen. Hepatic steatosis is noted. No gallstones, gallbladder wall thickening, or biliary dilatation. Pancreas: Pancreatic atrophy. No pancreatic ductal dilatation or surrounding inflammatory changes. Spleen: Normal in size without focal abnormality. Adrenals/Urinary Tract: No adrenal nodule or mass. The kidneys enhance symmetrically. Renal cysts are noted on the right. No renal calculus or hydronephrosis. No ureteral calculus or obstructive uropathy. There is diffuse irregular bladder wall thickening with perivesicular  fat stranding. A small enhancing focus is noted in the anterior aspect of the bladder wall on the right on portal venous phase. No definite intraluminal enhancement is seen. Prostate gland is surgically absent. Stomach/Bowel: Stomach is within normal limits. Appendix appears normal. No evidence of bowel wall thickening, distention, or inflammatory changes. No free air or pneumatosis. Multiple scattered diverticula are present along the colon without evidence of diverticulitis. Vascular/Lymphatic: Aortic atherosclerosis. No enlarged abdominal or pelvic lymph nodes. Other: No abdominopelvic ascites. Fat containing inguinal hernias are noted bilaterally. Fat containing umbilical hernia. Musculoskeletal: Degenerative changes in the thoracolumbar spine. There is moderate-to-severe spinal canal stenosis at L2-L3. No acute or suspicious osseous abnormality. IMPRESSION: 1. Diffuse irregular bladder wall thickening with perivesicular fat stranding. No intraluminal filling defect is seen. A tiny hypervascular focus is noted in the anterior bladder wall on the right only on portal venous phase. Residual urothelial neoplasm can not be excluded. 2. Status prostatectomy. No lymphadenopathy or other evidence of metastatic disease. 3. Stable bilateral pulmonary nodules, unchanged from 2015 and likely benign. 4. Hepatic steatosis. 5. Aortic atherosclerosis and coronary artery calcifications. Electronically Signed   By: LBrett FairyM.D.   On: 02/09/2022 01:03      IMPRESSION/PLAN:. 1. 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 We discussed the natural history of muscle invasive urothelial carcinoma and general treatment, highlighting the role of radiotherapy in a bladder sparing approach to management. We discussed the available radiation techniques, and focused on the details and logistics of delivery. We do recommend  [facility] We reviewed the anticipated acute and late sequelae associated with radiation in this setting. We would recommend a follow up with urology for in-office systoscopy to determine if maximal TURBT prior to starting treatment is necessary and recommended.  He prefers to transfer his care to Dr. Alyson Ingles, of [facility] in [place], for convenience and consolidation of care. We will make this referral and help coordinate for a new patient consultation, first available. The patient and his daughter were encouraged to ask questions that were answered to their stated satisfaction.   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 He has met with Dr. Delton Coombes and he is in agreement. We will make arrangements for a new patient consult with Dr. Alyson Ingles, first available, to determine if patient would benefit from a maximal TURBT procedure prior to starting treatment but anticipate starting treatments in the near future and will coordinate with both Dr. Alyson Ingles and Dr. Delton Coombes. We enjoyed meeting him and his daughter, [name], today and look forward to continuing to participate in his care. They have my contact information and know that they are welcome to call at any time with any further questions or concerns regarding radiation treatment.  We personally spent 70 minutes in this encounter including chart review, reviewing radiological studies, meeting face-to-face with the patient, entering orders and completing documentation.     Nicholos Johns, PA-C    Tyler Pita, MD  [facility] Direct Dial: [phone]  Fax: [phone] [email or web address]  Skype  [email or web address]   This document serves as a record of services personally performed by Tyler Pita, MD and [name], PA-C. It  was created on their behalf by Wilburn Mylar, a trained medical scribe. The creation of this record is based on the scribe's personal observations and the provider's statements to them. This document has been checked and approved by the attending provider.

## 2022-02-15 ENCOUNTER — Inpatient Hospital Stay: Payer: Medicare Other | Attending: Hematology | Admitting: Hematology

## 2022-02-15 VITALS — BP 128/67 | HR 59 | Temp 99.1°F | Resp 17 | Ht 73.0 in | Wt 217.1 lb

## 2022-02-15 DIAGNOSIS — D509 Iron deficiency anemia, unspecified: Secondary | ICD-10-CM | POA: Insufficient documentation

## 2022-02-15 DIAGNOSIS — G629 Polyneuropathy, unspecified: Secondary | ICD-10-CM | POA: Diagnosis not present

## 2022-02-15 DIAGNOSIS — Z8042 Family history of malignant neoplasm of prostate: Secondary | ICD-10-CM | POA: Insufficient documentation

## 2022-02-15 DIAGNOSIS — C678 Malignant neoplasm of overlapping sites of bladder: Secondary | ICD-10-CM | POA: Diagnosis not present

## 2022-02-15 DIAGNOSIS — C672 Malignant neoplasm of lateral wall of bladder: Secondary | ICD-10-CM | POA: Diagnosis not present

## 2022-02-15 DIAGNOSIS — Z87891 Personal history of nicotine dependence: Secondary | ICD-10-CM | POA: Diagnosis not present

## 2022-02-15 NOTE — Progress Notes (Signed)
[place], [place] [address]   CLINIC:  Medical Oncology/Hematology  Patient Care Team: Sharilyn Sites, MD as PCP - General (Family Medicine) Sherren Mocha, MD as PCP - Cardiology (Cardiology) Ilean China, RN as [place] Management Derek Jack, MD as Medical Oncologist (Medical Oncology) Brien Mates, RN as Oncology Nurse Navigator (Medical Oncology)   PRIOR THERAPY: None  CURRENT THERAPY: Trimodality therapy.  CHIEF COMPLAINTS/PURPOSE OF CONSULTATION:  T2 N0 bladder cancer  INTERVAL HISTORY: [name] is a 77 y.o. male presenting for follow up of T2 N0 bladder cancer.Marland Kitchen He was last seen by me in consult on 02/04/22. He established care with rad onc Dr. Tobey Bride on 02/10/22.  He scheduled for cystoscopy with TURBT with Dr. Johnston Ebbs on 03/12/22. He would prefer to have this procedure done with a [facility] provider.  Today, he states that he is doing well overall. His appetite level is at 100%. His energy level is at 75%. He denies any urinary problems. He has some chronic neuropathy in his hands/feet, and feels that this has improved.   HISTORY OF PRESENTING ILLNESS:  [name] 77 y.o. male is seen in consultation today at the request of Dr. Roslyn Smiling at [facility].  This patient had bladder cancer which was initially diagnosed on 12/16/2014 in the lateral wall.  He underwent TURBT in May 2020 followed by BCG induction.  Later he received intravesicular valrubicin in August 2020.  He will again had BCG therapy in October 2021.  In June 2022 through August 2022 he received intravesical gemcitabine/docetaxel.  Cystoscopy on 12/30/2021 showed lesions throughout the bladder, muscle invasive at left lateral wall.  He is accompanied by his daughter today.  He reports energy levels of 80%.  He is fairly active and lives at home by himself and is independent of ADLs and IADLs.  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  He was evaluated by Dr. Dyanne Iha at [facility] and discussed surgical options.  He wants to pursue the nonsurgical option with trimodality therapy if he can receive curative dosing.  MEDICAL HISTORY:  Past Medical History:  Diagnosis Date   Anginal pain ([place])    Arthritis    "all over"   Bladder tumor    Chronic lower back pain    Coronary artery disease    a. s/p CABG x 2 (LIMA->LAD, RIMA->RCA);  b. s/p multiple PCI's to Ramus;  c. 08/2011 Cath/PCI: LM 70% into ramus with 70-80% there->treated wtih 3.5x18 Xience Xpedition DES, LCX  nonobs, RCA occluded.  RIMA & LIMA patent, EF 55-65%   DJD (degenerative joint disease)    GERD (gastroesophageal reflux disease)    History of gout    Hyperlipidemia    Hypertension    Hypothyroidism    Neuropathy    OSA on CPAP    Prostate cancer ([place])     SURGICAL HISTORY: Past Surgical History:  Procedure Laterality Date   ABDOMINAL HERNIA REPAIR     BIOPSY  11/26/2015   Procedure: BIOPSY;  Surgeon: Rogene Houston, MD;  [facility];  Service: Endoscopy;;  gastric esophagus   BIOPSY  01/20/2021   Procedure: BIOPSY;  Surgeon: Harvel Quale, MD;  [facility];  Service: Gastroenterology;;   BIOPSY  10/28/2021   Procedure: BIOPSY;  Surgeon: Harvel Quale, MD;  [facility];  Service: Gastroenterology;;   BLADDER TUMOR EXCISION  BROW LIFT Bilateral 04/27/2019   Procedure: BILATERAL BLEPHAROPLASTY;  Surgeon: Baruch Goldmann, MD;  Location: [facility];  Service: Ophthalmology;  Laterality: Bilateral;   CARDIAC CATHETERIZATION N/A 04/10/2015   Procedure: Left Heart Cath and Cors/Grafts Angiography;  Surgeon: Sherren Mocha, MD;  Location: [place] [facility];  Service: Cardiovascular;  Laterality: N/A;   CARDIAC CATHETERIZATION N/A 04/10/2015   Procedure: Coronary Stent Intervention;  Surgeon: Sherren Mocha, MD;  Location: [place] [facility];  Service:  Cardiovascular;  Laterality: N/A;   CATARACT EXTRACTION W/PHACO Right 06/23/2015   Procedure: CATARACT EXTRACTION PHACO AND INTRAOCULAR LENS PLACEMENT RIGHT EYE; CDE:  7.08;  Surgeon: Tonny Branch, MD;  Location: [facility];  Service: Ophthalmology;  Laterality: Right;   CATARACT EXTRACTION W/PHACO Left 08/25/2015   Procedure: CATARACT EXTRACTION PHACO AND INTRAOCULAR LENS PLACEMENT LEFT EYE; CDE:  8.18;  Surgeon: Tonny Branch, MD;  Location: [facility];  Service: Ophthalmology;  Laterality: Left;   COLONOSCOPY N/A 12/28/2012   Procedure: COLONOSCOPY;  Surgeon: Rogene Houston, MD;  [facility];  Service: Endoscopy;  Laterality: N/A;  830 rescheduled   COLONOSCOPY WITH PROPOFOL N/A 01/20/2021   Procedure: COLONOSCOPY WITH PROPOFOL;  Surgeon: Harvel Quale, MD;  [facility];  Service: Gastroenterology;  Laterality: N/A;  11:05   CORONARY ANGIOPLASTY WITH STENT PLACEMENT  2013; 04/10/2015   "this makes me a total of 7" (04/10/2015)   CORONARY ARTERY BYPASS GRAFT  1997   with (LIMA)   ESOPHAGEAL DILATION N/A 11/26/2015   Procedure: ESOPHAGEAL DILATION;  Surgeon: Rogene Houston, MD;  [facility];  Service: Endoscopy;  Laterality: N/A;   ESOPHAGEAL DILATION N/A 05/23/2017   Procedure: ESOPHAGEAL DILATION;  Surgeon: Rogene Houston, MD;  [facility];  Service: Endoscopy;  Laterality: N/A;   ESOPHAGEAL DILATION N/A 06/16/2017   Procedure: ESOPHAGEAL DILATION;  Surgeon: Rogene Houston, MD;  [facility];  Service: Endoscopy;  Laterality: N/A;   ESOPHAGEAL DILATION  10/28/2021   Procedure: ESOPHAGEAL DILATION;  Surgeon: Harvel Quale, MD;  [facility];  Service: Gastroenterology;;   ESOPHAGOGASTRODUODENOSCOPY N/A 11/26/2015   Procedure: ESOPHAGOGASTRODUODENOSCOPY (EGD);  Surgeon: Rogene Houston, MD;  [facility];  Service: Endoscopy;  Laterality: N/A;  1:25   ESOPHAGOGASTRODUODENOSCOPY N/A 05/23/2017    Procedure: ESOPHAGOGASTRODUODENOSCOPY (EGD);  Surgeon: Rogene Houston, MD;  [facility];  Service: Endoscopy;  Laterality: N/A;  1:15   ESOPHAGOGASTRODUODENOSCOPY N/A 06/16/2017   Procedure: ESOPHAGOGASTRODUODENOSCOPY (EGD);  Surgeon: Rogene Houston, MD;  [facility];  Service: Endoscopy;  Laterality: N/A;  1240   ESOPHAGOGASTRODUODENOSCOPY (EGD) WITH PROPOFOL N/A 01/20/2021   Procedure: ESOPHAGOGASTRODUODENOSCOPY (EGD) WITH PROPOFOL;  Surgeon: Harvel Quale, MD;  [facility];  Service: Gastroenterology;  Laterality: N/A;   ESOPHAGOGASTRODUODENOSCOPY (EGD) WITH PROPOFOL N/A 10/28/2021   Procedure: ESOPHAGOGASTRODUODENOSCOPY (EGD) WITH PROPOFOL;  Surgeon: Harvel Quale, MD;  [facility];  Service: Gastroenterology;  Laterality: N/A;  215 ASA 2, pt will arrive at 10:45   HERNIA REPAIR Left    KNEE CARTILAGE SURGERY Bilateral    LEFT HEART CATH AND CORS/GRAFTS ANGIOGRAPHY N/A 09/08/2016   Procedure: LEFT HEART CATH AND CORS/GRAFTS ANGIOGRAPHY;  Surgeon: Martinique, Peter M, MD;  Location: [place] [facility];  Service: Cardiovascular;  Laterality: N/A;   LEFT HEART CATHETERIZATION WITH CORONARY ANGIOGRAM N/A 09/08/2011   Procedure: LEFT HEART CATHETERIZATION WITH CORONARY ANGIOGRAM;  Surgeon: Sherren Mocha, MD;  Location: [facility]  CATH LAB;  Service: Cardiovascular;  Laterality: N/A;   PERCUTANEOUS CORONARY STENT INTERVENTION (PCI-S) Right 09/08/2011   Procedure: PERCUTANEOUS CORONARY STENT INTERVENTION (PCI-S);  Surgeon: Sherren Mocha, MD;  Location: [facility] CATH LAB;  Service: Cardiovascular;  Laterality: Right;   POLYPECTOMY  01/20/2021   Procedure: POLYPECTOMY;  Surgeon: Harvel Quale, MD;  [facility];  Service: Gastroenterology;;   ROBOT ASSISTED LAPAROSCOPIC RADICAL PROSTATECTOMY      SOCIAL HISTORY: Social History   Socioeconomic History   Marital status: Widowed    Spouse name: Not on file   Number of  children: Not on file   Years of education: Not on file   Highest education level: Not on file  Occupational History   Occupation: semi-retired  Tobacco Use   Smoking status: Former    Types: Cigars    Passive exposure: Past   Smokeless tobacco: Never   Tobacco comments:    "quit smoking in ~ 1978"  Vaping Use   Vaping Use: Never used  Substance and Sexual Activity   Alcohol use: Yes    Comment: occasionally    Drug use: No   Sexual activity: Not Currently  Other Topics Concern   Not on file  Social History Narrative   Not on file   Social Determinants of Health   Financial Resource Strain: Low Risk  (01/07/2022)   Overall Financial Resource Strain (CARDIA)    Difficulty of Paying Living Expenses: Not hard at all  Food Insecurity: No Food Insecurity (02/04/2022)   Hunger Vital Sign    Worried About Running Out of Food in the Last Year: Never true    [place] in the Last Year: Never true  Transportation Needs: No Transportation Needs (02/04/2022)   PRAPARE - Hydrologist (Medical): No    Lack of Transportation (Non-Medical): No  Physical Activity: Not on file  Stress: Not on file  Social Connections: Not on file  Intimate Partner Violence: Not At Risk (02/04/2022)   Humiliation, Afraid, Rape, and Kick questionnaire    Fear of Current or Ex-Partner: No    Emotionally Abused: No    Physically Abused: No    Sexually Abused: No    FAMILY HISTORY: Family History  Problem Relation Age of Onset   Cancer Father 36       died   Stroke Mother 69       died    ALLERGIES:  is allergic to codeine and pollen extract.  MEDICATIONS:  Current Outpatient Medications  Medication Sig Dispense Refill   amLODipine (NORVASC) 10 MG tablet Take 10 mg by mouth in the morning.     aspirin EC 81 MG tablet Take 81 mg by mouth every evening. 30 tablet    atorvastatin (LIPITOR) 40 MG tablet Take 0.5 tablets (20 mg total) by mouth every evening. 45  tablet 3   BLACK CURRANT SEED OIL PO Take 1 each by mouth 2 (two) times a week. 1 teaspoonful twice weekly     carvedilol (COREG) 6.25 MG tablet Take 1 tablet (6.25 mg total) by mouth 2 (two) times daily. 180 tablet 3   cetirizine (ZYRTEC) 10 MG tablet Take 10 mg by mouth every evening.     cholecalciferol (VITAMIN D3) 25 MCG (1000 UNIT) tablet Take 1,000 Units by mouth every evening.     clopidogrel (PLAVIX) 75 MG tablet Take 1 tablet (75 mg total) by mouth in the morning. 90 tablet 3   Coenzyme  Q10 300 MG CAPS Take 300 mg by mouth every evening.     cyanocobalamin (,VITAMIN B-12,) 1000 MCG/ML injection Inject 1,000 mcg into the muscle See admin instructions. Inject (1000 mcg) intramuscularly once monthly as needed for B-12 deficiency.  2   fenofibrate 160 MG tablet Take 1 tablet (160 mg total) by mouth daily. (Patient taking differently: Take 160 mg by mouth every evening.) 30 tablet 11   FEROSUL 325 (65 Fe) MG tablet TAKE 1 TABLET(325 MG) BY MOUTH TWICE DAILY WITH A MEAL 180 tablet 3   gabapentin (NEURONTIN) 600 MG tablet Take 600 mg by mouth 3 (three) times daily.  3   GLUCOSAMINE-CHONDROITIN PO Take 1 tablet by mouth every evening.     isosorbide mononitrate (IMDUR) 60 MG 24 hr tablet Take 1.5 tablets (90 mg total) by mouth daily. (Patient taking differently: Take 90 mg by mouth every evening.) 135 tablet 3   levothyroxine (SYNTHROID, LEVOTHROID) 150 MCG tablet Take 150 mcg by mouth daily before breakfast.     meclizine (ANTIVERT) 25 MG tablet Take 1 tablet (25 mg total) by mouth 2 (two) times daily as needed for dizziness. (Patient taking differently: Take 12.5 mg by mouth at bedtime as needed for dizziness.) 30 tablet 0   nitroGLYCERIN (NITROSTAT) 0.4 MG SL tablet Place 1 tablet (0.4 mg total) under the tongue every 5 (five) minutes as needed for chest pain. 25 tablet 11   Polyethyl Glycol-Propyl Glycol (LUBRICANT EYE DROPS) 0.4-0.3 % SOLN Place 1-2 drops into both eyes 3 (three) times daily  as needed (dry/irritated eyes.).     psyllium (METAMUCIL) 58.6 % powder Take 1 packet by mouth 2 (two) times a week.     traMADol (ULTRAM) 50 MG tablet Take 50 mg by mouth 3 (three) times daily.  1   celecoxib (CELEBREX) 200 MG capsule Take 200 mg by mouth in the morning. (Patient not taking: Reported on 02/15/2022)     Omega-3 Fatty Acids (FISH OIL) 1000 MG CAPS Take 1 capsule by mouth in the morning. (Patient not taking: Reported on 02/15/2022)     No current facility-administered medications for this visit.    REVIEW OF SYSTEMS:   Review of Systems  Constitutional:  Negative for chills, fatigue and fever.  HENT:   Negative for lump/mass, mouth sores, nosebleeds, sore throat and trouble swallowing.   Respiratory:  Positive for shortness of breath. Negative for cough.   Cardiovascular:  Negative for chest pain, leg swelling and palpitations.  Gastrointestinal:  Negative for abdominal pain, constipation, diarrhea, nausea and vomiting.  Genitourinary:  Positive for nocturia. Negative for bladder incontinence, difficulty urinating, dysuria, frequency and hematuria.   Musculoskeletal:  Negative for arthralgias, back pain, flank pain, myalgias and neck pain.  Skin:  Negative for itching and rash.  Neurological:  Positive for dizziness. Negative for headaches and numbness.       + tingling hands/feet  Hematological:  Does not bruise/bleed easily.  Psychiatric/Behavioral:  Negative for depression, sleep disturbance and suicidal ideas. The patient is not nervous/anxious.   All other systems reviewed and are negative.    PHYSICAL EXAMINATION: ECOG PERFORMANCE STATUS: 1 - Symptomatic but completely ambulatory  Vitals:   02/15/22 1116  BP: 128/67  Pulse: (!) 59  Resp: 17  Temp: 99.1 F (37.3 C)  SpO2: 97%    Filed Weights   02/15/22 1116  Weight: 98.5 kg (217 lb 1.6 oz)    Physical Exam Vitals and nursing note reviewed. Exam conducted with a chaperone present.  Constitutional:       Appearance: Normal appearance.  Cardiovascular:     Rate and Rhythm: Normal rate and regular rhythm.     Pulses: Normal pulses.     Heart sounds: Normal heart sounds.  Pulmonary:     Effort: Pulmonary effort is normal.     Breath sounds: Normal breath sounds.  Abdominal:     Palpations: Abdomen is soft. There is no hepatomegaly, splenomegaly or mass.     Tenderness: There is no abdominal tenderness.  Lymphadenopathy:     Cervical: No cervical adenopathy.     Right cervical: No superficial cervical adenopathy.    Left cervical: No superficial cervical adenopathy.  Neurological:     General: No focal deficit present.     Mental Status: He is alert and oriented to person, place, and time.  Psychiatric:        Mood and Affect: Mood normal.        Behavior: Behavior normal.      LABORATORY DATA:  I have reviewed the data as listed Lab Results  Component Value Date   WBC 5.3 02/04/2022   HGB 10.4 (L) 02/04/2022   HCT 31.7 (L) 02/04/2022   MCV 101.9 (H) 02/04/2022   PLT 186 02/04/2022     Chemistry      Component Value Date/Time   NA 139 02/04/2022 1007   NA 145 (H) 11/11/2021 0000   K 4.3 02/04/2022 1007   CL 104 02/04/2022 1007   CO2 26 02/04/2022 1007   BUN 20 02/04/2022 1007   BUN 19 11/11/2021 0000   CREATININE 1.35 (H) 02/04/2022 1007   CREATININE 1.69 (H) 12/14/2021 1124      Component Value Date/Time   CALCIUM 8.8 (L) 02/04/2022 1007   ALKPHOS 40 02/04/2022 1007   AST 27 02/04/2022 1007   ALT 27 02/04/2022 1007   BILITOT 0.6 02/04/2022 1007   BILITOT 0.3 10/06/2021 1542       RADIOGRAPHIC STUDIES: I have personally reviewed the radiological images as listed and agreed with the findings in the report. NM Bone Scan Whole Body  Result Date: 02/10/2022 CLINICAL DATA:  History of bladder cancer for 3 years EXAM: NUCLEAR MEDICINE WHOLE BODY BONE SCAN TECHNIQUE: Whole body anterior and posterior images were obtained approximately 3 hours after intravenous  injection of radiopharmaceutical. RADIOPHARMACEUTICALS:  18.5 mCi Technetium-66mMDP IV COMPARISON:  01/19/2006 FINDINGS: Adequate uptake is noted throughout the bony skeleton. Mild increased activity is noted in the medial left knee joint consistent with degenerative change. No focal area of increased activity is identified to suggest metastatic disease. Mild degenerative changes in the thoracolumbar spine are seen as well. IMPRESSION: No evidence of metastatic disease. Mild degenerative changes are noted as described. Electronically Signed   By: MInez CatalinaM.D.   On: 02/10/2022 01:49   CT ABDOMEN PELVIS W WO CONTRAST  Result Date: 02/09/2022 CLINICAL DATA:  History of prostate cancer, follow-up for cystoscopy which showed lesions throughout the bladder and muscle invasive disease. * Tracking Code: BO * EXAM: CT ABDOMEN AND PELVIS WITHOUT AND WITH CONTRAST TECHNIQUE: Multidetector CT imaging of the abdomen and pelvis was performed following the standard protocol before and following the bolus administration of intravenous contrast. RADIATION DOSE REDUCTION: This exam was performed according to the departmental dose-optimization program which includes automated exposure control, adjustment of the mA and/or kV according to patient size and/or use of iterative reconstruction technique. CONTRAST:  765mOMNIPAQUE IOHEXOL 350 MG/ML SOLN COMPARISON:  November 01, 2019. FINDINGS: Lower chest: Lung bases are clear. No effusion. Tiny LEFT lower lobe pulmonary nodule unchanged compared to previous imaging 3 mm (image 7/4) no effusion. No consolidative changes. Hepatobiliary: Mild hepatic steatosis is suggested. No focal, suspicious hepatic lesion. Fissural widening of hepatic fissures. No pericholecystic stranding or biliary duct dilation. Portal vein is patent. Pancreas: Pancreatic atrophy without signs of inflammation or ductal dilation similar to previous imaging. Spleen: Normal. Adrenals/Urinary Tract: Adrenal glands  are normal. Mild cortical scarring of the bilateral kidneys. No signs of abnormal enhancement along the course of LEFT or RIGHT ureter. No suspicious renal lesion with stable Bosniak category II cyst arising upper pole the RIGHT kidney. This measures approximately 4.8 cm. Smaller cyst in the lower pole also compatible with benign renal cyst. Perivesical stranding with irregular thickening of the RIGHT greater than LEFT urinary bladder wall. No discrete masslike lesion. Mildly heterogeneous enhancement. Post prostatectomy. Stomach/Bowel: Colonic diverticulosis and diverticular disease worse in the sigmoid similar to prior imaging. No acute gastrointestinal findings. Normal appendix. Vascular/Lymphatic: Aortic atherosclerosis. No sign of aneurysm. Smooth contour of the IVC. There is no gastrohepatic or hepatoduodenal ligament lymphadenopathy. No retroperitoneal or mesenteric lymphadenopathy. No pelvic sidewall lymphadenopathy. Reproductive: Post prostatectomy. Other: No ascites. Musculoskeletal: No acute bone finding. No destructive bone process. Spinal degenerative changes. Excretory phase: Urinary bladder is incompletely opacified on excretory phase. Mildly patulous appearance of the distal LEFT ureter also incompletely opacified. Peripelvic stranding on the LEFT. No filling defect in the upper tracts to indicate upper tract disease. Bifid renal pelvis on the RIGHT. IMPRESSION: 1. Perivesical stranding with irregular thickening of the RIGHT greater than LEFT urinary bladder wall. No discrete masslike lesion. Mildly heterogeneous enhancement of the urinary bladder. Findings may be related to post treatment related changes. Correlate with any signs of cystitis. Cystoscopic correlation may also be helpful if not recently performed. 2. No filling defect in the upper tracts to indicate upper tract disease. No secondary sign of stricture. 3. Post prostatectomy. 4. Mild hepatic steatosis is suggested. Fissural widening of  hepatic fissures. Correlate with any clinical or laboratory evidence of liver disease. 5. Colonic diverticulosis and diverticular disease worse in the sigmoid similar to prior imaging. 6. Tiny LEFT lower lobe pulmonary nodule unchanged compared to previous imaging 3 mm. Compatible with benign pulmonary nodule. 7. Aortic atherosclerosis. Aortic Atherosclerosis (ICD10-I70.0). Electronically Signed   By: Zetta Bills M.D.   On: 02/09/2022 10:41   CT CHEST W CONTRAST  Result Date: 02/09/2022 CLINICAL DATA:  Prostate cancer. Follow-up for cystoscopy with multiple lesions in bladder. EXAM: CT CHEST WITH CONTRAST CT ABDOMEN WITHOUT AND WITH CONTRAST TECHNIQUE: Multidetector CT imaging of the abdomen was performed without intravenous contrast. Multidetector CT imaging of the chest and abdomen was then performed during bolus administration of intravenous contrast. RADIATION DOSE REDUCTION: This exam was performed according to the departmental dose-optimization program which includes automated exposure control, adjustment of the mA and/or kV according to patient size and/or use of iterative reconstruction technique. CONTRAST:  50m OMNIPAQUE IOHEXOL 350 MG/ML SOLN COMPARISON:  07/05/2013, 11/02/2019. FINDINGS: CT CHEST WITH CONTRAST Cardiovascular: The heart is normal in size and there is no pericardial effusion. Three-vessel coronary artery calcifications are noted. There is atherosclerotic calcification of the aorta without evidence of aneurysm. The pulmonary trunk is normal in caliber. Mediastinum/Nodes: No mediastinal, hilar, or axillary lymphadenopathy. The thyroid gland, trachea, and esophagus are within normal limits. Lungs/Pleura: Mild atelectasis is present bilaterally. There is a 3 mm nodule in the left lower lobe,  axial image 113, unchanged. There is a 6 mm nodule in the right middle lobe, axial image 87, unchanged from 2015 and likely benign. No effusion or pneumothorax. Musculoskeletal: Sternotomy wires are  noted. Degenerative changes in the thoracic spine. No acute or suspicious osseous abnormality. Confluence osteophytes are present along the anterior longitudinal ligament which may be associated with diffuse idiopathic skeletal hyperostosis. CT ABDOMEN WITHOUT AND WITH CONTRAST Hepatobiliary: No focal liver abnormality is seen. Hepatic steatosis is noted. No gallstones, gallbladder wall thickening, or biliary dilatation. Pancreas: Pancreatic atrophy. No pancreatic ductal dilatation or surrounding inflammatory changes. Spleen: Normal in size without focal abnormality. Adrenals/Urinary Tract: No adrenal nodule or mass. The kidneys enhance symmetrically. Renal cysts are noted on the right. No renal calculus or hydronephrosis. No ureteral calculus or obstructive uropathy. There is diffuse irregular bladder wall thickening with perivesicular fat stranding. A small enhancing focus is noted in the anterior aspect of the bladder wall on the right on portal venous phase. No definite intraluminal enhancement is seen. Prostate gland is surgically absent. Stomach/Bowel: Stomach is within normal limits. Appendix appears normal. No evidence of bowel wall thickening, distention, or inflammatory changes. No free air or pneumatosis. Multiple scattered diverticula are present along the colon without evidence of diverticulitis. Vascular/Lymphatic: Aortic atherosclerosis. No enlarged abdominal or pelvic lymph nodes. Other: No abdominopelvic ascites. Fat containing inguinal hernias are noted bilaterally. Fat containing umbilical hernia. Musculoskeletal: Degenerative changes in the thoracolumbar spine. There is moderate-to-severe spinal canal stenosis at L2-L3. No acute or suspicious osseous abnormality. IMPRESSION: 1. Diffuse irregular bladder wall thickening with perivesicular fat stranding. No intraluminal filling defect is seen. A tiny hypervascular focus is noted in the anterior bladder wall on the right only on portal venous phase.  Residual urothelial neoplasm can not be excluded. 2. Status prostatectomy. No lymphadenopathy or other evidence of metastatic disease. 3. Stable bilateral pulmonary nodules, unchanged from 2015 and likely benign. 4. Hepatic steatosis. 5. Aortic atherosclerosis and coronary artery calcifications. Electronically Signed   By: Brett Fairy M.D.   On: 02/09/2022 01:03    ASSESSMENT:  1.  T2 N0 bladder cancer (multifocal): - 12/16/2014: Initial diagnosis, malignant neoplasm of lateral wall of bladder - 03/14/2018: TURBT (pathology-invasive papillary urothelial carcinoma, high-grade, tumor involves lamina propria, muscularis propria present, not involved by tumor) - 05/04/2018 - 06/13/2018: Initial BCG induction course - 08/22/2018: Bladder biopsy HG Ta, multifocal - 08/29/2018 - 10/01/2018: Intravesicular valrubicin - 02/20/2019: Bladder biopsy-benign - 09/15/2019: TURBT- HG Ta, muscle present - 10/24/2019 - 11/28/2019: Completion of BCG induction course x 6 - 05/14/2020: TURBT-HG Ta, muscle present - 07/02/2020 - 08/20/2020: Intravesical gemcitabine/docetaxel - 03/10/2021: TURBT-HG Ta + CIS, muscle present - 04/29/2021 - 06/03/2021: Reinduction intravesical gemcitabine/docetaxel - 07/06/2021: Cystoscopy without any visible tumors, cytology with high-grade urothelial carcinoma - 12/30/2021: Progression with lesions throughout the bladder, muscle invasive at left lateral wall - He has chronic neuropathy, CKD which precludes him from cisplatin-based neoadjuvant chemotherapy followed by cystectomy.  2.  Social/family history: - He lives at home by himself.  He is seen today with his daughter.  He is independent of ADLs and IADLs.  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  He dances 1-2 times per week.  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  Quit smoking 50 years ago. - Father had prostate cancer.  Maternal grandmother had cancer, type unknown to the patient.  PLAN:  1.  T2 N0 bladder  cancer: - He met with radiation oncology at [facility].  Due to multifocal nature of bladder cancer, they recommended him to go for surgery. - He met with Dr. Tammi Klippel at [facility].  He was recommended to proceed with trimodality therapy. - Patient is leaning more towards trimodality therapy despite a multifocal cancer and a loop of bowel close to the bladder. - We discussed CT CAP results which did not show any metastatic disease.  We also discussed bone scan results which did not show metastatic disease. - He will see Dr. Alyson Ingles for cystoscopy and possible maximal TURBT prior to initiation of chemoradiation therapy. - We have talked about port placement for chemotherapy.  Will schedule it. - RTC 3 to 4 weeks for follow-up.  2.  Normocytic to microcytic anemia: - We talked about Feraheme weekly x 2 as his hemoglobin is 10.4, in the setting of CKD and ferritin of 93 and percent saturation of 32.  We will schedule him for infusions.   Orders Placed This Encounter  Procedures   IR IMAGING GUIDED PORT INSERTION    Standing Status:   Future    Standing Expiration Date:   02/16/2023    Order Specific Question:   Reason for Exam (SYMPTOM  OR DIAGNOSIS REQUIRED)    Answer:   bladder cancer, chemotherapy administration    Order Specific Question:   Preferred Imaging Location?    Answer:   [facility]    Order Specific Question:   Release to patient    Answer:   Immediate    All questions were answered. The patient knows to call the clinic with any problems, questions or concerns.   I,Alexis Herring,acting as a Education administrator for [employer], MD.,have documented all relevant documentation on the behalf of Derek Jack, MD,as directed by  Derek Jack, MD while in the presence of Derek Jack, MD.  I, Derek Jack MD, have reviewed the above documentation for accuracy and completeness, and I agree with the above.     Alexis Herring 02/15/2022 11:36  AM

## 2022-02-15 NOTE — Patient Instructions (Addendum)
[name]  Discharge Instructions  You were seen and examined today by Dr. Delton Coombes.  Dr. Delton Coombes has reviewed your CT scan and Bone scan. There is no spread of the cancer.  Please see the urologist so that he can look inside your bladder.  Prior to the start of chemotherapy, we will send you for a Port-A-Cath placement.  You will start chemotherapy and radiation therapy on the same day, once radiation is ready to begin.  Follow-up as scheduled.  Thank you for choosing [facility] to provide your oncology and hematology care.   To afford each patient quality time with our provider, please arrive at least 15 minutes before your scheduled appointment time. You may need to reschedule your appointment if you arrive late (10 or more minutes). Arriving late affects you and other patients whose appointments are after yours.  Also, if you miss three or more appointments without notifying the office, you may be dismissed from the clinic at the provider's discretion.    Again, thank you for choosing [facility].  Our hope is that these requests will decrease the amount of time that you wait before being seen by our physicians.   If you have a lab appointment with the [facility] please come in thru the Main Entrance and check in at the main information desk.           _____________________________________________________________  Should you have questions after your visit to [facility], please contact our office at [phone] and follow the prompts.  Our office hours are 8:00 a.m. to 4:30 p.m. Monday - Thursday and 8:00 a.m. to 2:30 p.m. Friday.  Please note that voicemails left after 4:00 p.m. may not be returned until the following business day.  We are closed weekends and all major holidays.  You do have access to a nurse 24-7, just call the main number to the clinic [phone] and do not press any  options, hold on the line and a nurse will answer the phone.    For prescription refill requests, have your pharmacy contact our office and allow 72 hours.    Masks are optional in the cancer centers. If you would like for your care team to wear a mask while they are taking care of you, please let them know. You may have one support person who is at least 79 years old accompany you for your appointments.

## 2022-02-16 ENCOUNTER — Inpatient Hospital Stay: Payer: Medicare Other

## 2022-02-16 VITALS — BP 142/59 | HR 57 | Temp 98.0°F | Resp 16

## 2022-02-16 DIAGNOSIS — C672 Malignant neoplasm of lateral wall of bladder: Secondary | ICD-10-CM | POA: Diagnosis not present

## 2022-02-16 DIAGNOSIS — Z87891 Personal history of nicotine dependence: Secondary | ICD-10-CM | POA: Diagnosis not present

## 2022-02-16 DIAGNOSIS — D509 Iron deficiency anemia, unspecified: Secondary | ICD-10-CM | POA: Diagnosis not present

## 2022-02-16 DIAGNOSIS — G629 Polyneuropathy, unspecified: Secondary | ICD-10-CM | POA: Diagnosis not present

## 2022-02-16 MED ORDER — HEPARIN SOD (PORK) LOCK FLUSH 100 UNIT/ML IV SOLN: 500.0000 [IU] | Freq: Once | INTRAVENOUS | Status: DC | PRN

## 2022-02-16 MED ORDER — SODIUM CHLORIDE 0.9% FLUSH: 10.0000 mL | Freq: Once | INTRAVENOUS | Status: DC | PRN

## 2022-02-16 MED ORDER — ALTEPLASE 2 MG IJ SOLR: 2.0000 mg | Freq: Once | INTRAMUSCULAR | Status: DC | PRN

## 2022-02-16 MED ORDER — SODIUM CHLORIDE 0.9% FLUSH: 3.0000 mL | Freq: Once | INTRAVENOUS | Status: DC | PRN

## 2022-02-16 MED ORDER — SODIUM CHLORIDE 0.9 % IV SOLN: Freq: Once | INTRAVENOUS | Status: AC

## 2022-02-16 MED ORDER — SODIUM CHLORIDE 0.9 % IV SOLN
510.0000 mg | Freq: Once | INTRAVENOUS | Status: AC
  Administered 2022-02-16: 510 mg via INTRAVENOUS
  Filled 2022-02-16: qty 17

## 2022-02-16 MED ORDER — HEPARIN SOD (PORK) LOCK FLUSH 100 UNIT/ML IV SOLN: 250.0000 [IU] | Freq: Once | INTRAVENOUS | Status: DC | PRN

## 2022-02-16 NOTE — Patient Instructions (Signed)
[facility]  Discharge Instructions: Thank you for choosing [facility] to provide your oncology and hematology care.  If you have a lab appointment with the [facility], please come in thru the Main Entrance and check in at the main information desk.  Wear comfortable clothing and clothing appropriate for easy access to any Portacath or PICC line.   We strive to give you quality time with your provider. You may need to reschedule your appointment if you arrive late (15 or more minutes).  Arriving late affects you and other patients whose appointments are after yours.  Also, if you miss three or more appointments without notifying the office, you may be dismissed from the clinic at the provider's discretion.      For prescription refill requests, have your pharmacy contact our office and allow 72 hours for refills to be completed.    Today you received the following chemotherapy and/or immunotherapy agents Fereaheme      To help prevent nausea and vomiting after your treatment, we encourage you to take your nausea medication as directed.  BELOW ARE SYMPTOMS THAT SHOULD BE REPORTED IMMEDIATELY: *FEVER GREATER THAN 100.4 F (38 C) OR HIGHER *CHILLS OR SWEATING *NAUSEA AND VOMITING THAT IS NOT CONTROLLED WITH YOUR NAUSEA MEDICATION *UNUSUAL SHORTNESS OF BREATH *UNUSUAL BRUISING OR BLEEDING *URINARY PROBLEMS (pain or burning when urinating, or frequent urination) *BOWEL PROBLEMS (unusual diarrhea, constipation, pain near the anus) TENDERNESS IN MOUTH AND THROAT WITH OR WITHOUT PRESENCE OF ULCERS (sore throat, sores in mouth, or a toothache) UNUSUAL RASH, SWELLING OR PAIN  UNUSUAL VAGINAL DISCHARGE OR ITCHING   Items with * indicate a potential emergency and should be followed up as soon as possible or go to the Emergency Department if any problems should occur.  Please show the CHEMOTHERAPY ALERT CARD or IMMUNOTHERAPY ALERT CARD at check-in to the  Emergency Department and triage nurse.  Should you have questions after your visit or need to cancel or reschedule your appointment, please contact [facility] [phone]  and follow the prompts.  Office hours are 8:00 a.m. to 4:30 p.m. Monday - Friday. Please note that voicemails left after 4:00 p.m. may not be returned until the following business day.  We are closed weekends and major holidays. You have access to a nurse at all times for urgent questions. Please call the main number to the clinic [phone] and follow the prompts.  For any non-urgent questions, you may also contact your provider using MyChart. We now offer e-Visits for anyone 66 and older to request care online for non-urgent symptoms. For details visit [email or web address].   Also download the MyChart app! Go to the app store, search "MyChart", open the app, select [facility], and log in with your MyChart username and password.

## 2022-02-16 NOTE — Progress Notes (Signed)
Patient presents today for Feraheme infusion per providers order.  Vital signs WNL.  Patient has no new complaints at this time.  Peripheral IV started and blood return noted pre and post infusion.    Stable during infusion without adverse affects.  Vital signs stable.  No complaints at this time.  Discharge from clinic ambulatory in stable condition.  Alert and oriented X 3.  Follow up with [facility] as scheduled.  

## 2022-02-17 ENCOUNTER — Encounter: Payer: Self-pay | Admitting: *Deleted

## 2022-02-17 ENCOUNTER — Telehealth: Payer: Self-pay | Admitting: *Deleted

## 2022-02-17 ENCOUNTER — Ambulatory Visit: Payer: Medicare Other | Admitting: Radiation Oncology

## 2022-02-17 ENCOUNTER — Encounter: Payer: Self-pay | Admitting: Radiation Oncology

## 2022-02-17 ENCOUNTER — Ambulatory Visit: Payer: Self-pay | Admitting: *Deleted

## 2022-02-17 NOTE — Patient Outreach (Signed)
  Care Coordination   02/17/2022 Name: [name] MRN: [id number] DOB: 1945/10/23   Care Coordination Outreach Attempts:  An unsuccessful telephone outreach was attempted for a scheduled appointment today.  Follow Up Plan:  Additional outreach attempts will be made to offer the patient care coordination information and services.   Encounter Outcome:  No Answer. Left HIPAA compliant VM requesting return call if he has any acute needs. If not, care guide will reach out to reschedule telephone follow-up.   Care Coordination Interventions:  No, not indicated   Recent oncology office notes, plan of care, and upcoming appointments reviewed in preparation for telephone follow-up.  [name], BSN, RN-BC RN Care Coordinator [place] Direct Dial: [phone] Main #: [phone]

## 2022-02-18 ENCOUNTER — Encounter: Payer: Self-pay | Admitting: *Deleted

## 2022-02-18 ENCOUNTER — Ambulatory Visit: Payer: Self-pay | Admitting: *Deleted

## 2022-02-18 ENCOUNTER — Telehealth: Payer: Self-pay

## 2022-02-18 NOTE — Telephone Encounter (Signed)
RN spoke with [name] (daughter) of [name] in reference to request to have bladder scraping procedure moved to earlier date from March 1st and Dr. Alyson Ingles is out of the office on vacation.  Per [name], PA-C advised that March 1 date is likely the soonest procedure can be done, it would likely be 3 weeks for Dr. Alyson Ingles too since he would have to consult and then schedule.  Recommended she reach out to Dr. Sherron Monday office in hopes they can get procedure done sooner.  She was appreciative for the call and said she had already made them aware that they will call if anything sooner is available.

## 2022-02-18 NOTE — Patient Outreach (Signed)
  Care Coordination   Follow Up Visit Note   02/18/2022 Name: [name] MRN: [id number] DOB: Aug 19, 1945  [name] is a 77 y.o. year old male who sees Sharilyn Sites, MD for primary care. I spoke with  [name] and his daughter, [name], by phone today.  What matters to the patients health and wellness today?  Managing appointments related to bladder cancer    Goals Addressed             This Visit's Progress    Care Coordination Services r/t Bladder Cancer       Care Coordination Interventions: Evaluation of current treatment plan related to bladder malignancy and patient's adherence to plan as established by provider Discussed plans with patient for ongoing care management follow up and provided patient with direct contact information for care management team Assessed social determinant of health barriers Reviewed and disuscussed recent oncology appointment and plan of care Talked with patient and his daughter, [name], regarding plan of care Reviewed and discussed upcoming appointments: [facility] on 02/23/22 at 2:30 for iron infusion IR appt 02/24/22 at 11:00 at [facility]. Verified location with scheduling dept. Pt thought appt was a [facility]. Arrive at 9. NPO after midnight. Will need a driver. Enter through main entrance. Provided information on location, valet parking, parking deck. Verified that his girlfriend will take him to that appt.  [place] on 03/02/22 at 2:00 for chemo teaching Dr Delton Coombes on 03/08/22 at 11:00 Dr Dyanne Iha (urology) with [facility] on 03/12/22 at 8:30 for cystoscopy w/ TURBT Therapeutic listening utilized regarding health related anxiety and frustration with wait time for urology appointment Talked with patient and daughter about other urology options. Reviewed My Chart message from oncology dept that recommended he keep the appt with Dr Dyanne Iha on 03/12/22 and reach out to their office to see if they can schedule the procedure sooner. Getting in with  another provider would likely slow down the process because he would need a consultation first and he has already consulted with Dr Dyanne Iha Discussed desire to f/u with a local urologist after cystoscopy. Daughter will reach out to Dr Noland Fordyce office in [place] to see if an appt can be scheduled for him to take over care once the cystoscopy has been done [employer] for diabetes nutrition education and requested to cancel appt with Jearld Fenton for now in light of the new cancer diagnosis and all of the appts associated with that. That appt was cancelled.  Cancelled appt with Dr Alyson Ingles that was accidentally scheduled through My Chart in the same appt spot that had previously been cancelled due to conflicting time with IR appt. Provided with [place] contact number and encouraged to reach out as needed Scheduled telephone f/u with [facility] for 03/11/22         SDOH assessments and interventions completed:  Yes  SDOH Interventions Today    Flowsheet Row Most Recent Value  SDOH Interventions   Transportation Interventions Intervention Not Indicated  Financial Strain Interventions Intervention Not Indicated        Care Coordination Interventions:  Yes, provided   Follow up plan: Follow up call scheduled for 03/11/22    Encounter Outcome:  Pt. Visit Completed   [name], BSN, RN-BC RN Care Coordinator [place]: [phone] Main #: [phone]

## 2022-02-18 NOTE — Patient Outreach (Signed)
  Care Coordination   Follow Up Visit Note   02/17/2022 Name: ROSHUN KLINGENSMITH MRN: [id number] DOB: [birth year]  [name] is a 77 y.o. year old male who sees Sharilyn Sites, MD for primary care. I spoke with  [name] by phone today.  What matters to the patients health and wellness today?  Managing appointments for bladder cancer    Goals Addressed             This Visit's Progress    Care Coordination Services       Care Coordination Interventions: Evaluation of current treatment plan related to malignant bladder biopsy results and patient's adherence to plan as established by provider Discussed plans with patient for ongoing care management follow up and provided patient with direct contact information for care management team Assessed social determinant of health barriers Reviewed and disuscussed recent oncoloty appointment and plan of care Reviewed and discussed upcoming appointments for iron infusion, cystoscopy, and interventional radiology.  Will need to verify location of IR appt as patient believed it was scheduled at [facility] but it appears to be at [facility]. Discussed desire to have cystoscopy with bladder scraping scheduled with a [facility] urologist or [facility] and sooner than is currenly scheduled with Dr Dyanne Iha at [facility] on 03/12/22 Therapeutic listening utilized regarding health related anxiety and frustration with wait time for urology appointment Provided with [facility] contact number and encouraged to reach out as needed         SDOH assessments and interventions completed:  Yes  SDOH Interventions Today    Flowsheet Row Most Recent Value  SDOH Interventions   Transportation Interventions Intervention Not Indicated  Financial Strain Interventions Intervention Not Indicated        Care Coordination Interventions:  Yes, provided   Follow up plan: Follow up call scheduled for 02/18/22    Encounter Outcome:  Pt. Visit Completed   [name], BSN,  RN-BC [place]: [phone] Main #: [phone]   (*late entry for telephone call on 02/17/22)

## 2022-02-22 ENCOUNTER — Ambulatory Visit: Payer: Medicare Other | Admitting: Nutrition

## 2022-02-23 ENCOUNTER — Other Ambulatory Visit: Payer: Self-pay | Admitting: Radiology

## 2022-02-23 ENCOUNTER — Inpatient Hospital Stay: Payer: Medicare Other

## 2022-02-23 VITALS — BP 144/64 | HR 80 | Temp 100.0°F | Resp 19

## 2022-02-23 DIAGNOSIS — D509 Iron deficiency anemia, unspecified: Secondary | ICD-10-CM | POA: Diagnosis not present

## 2022-02-23 DIAGNOSIS — C672 Malignant neoplasm of lateral wall of bladder: Secondary | ICD-10-CM | POA: Diagnosis not present

## 2022-02-23 DIAGNOSIS — Z87891 Personal history of nicotine dependence: Secondary | ICD-10-CM | POA: Diagnosis not present

## 2022-02-23 DIAGNOSIS — G629 Polyneuropathy, unspecified: Secondary | ICD-10-CM | POA: Diagnosis not present

## 2022-02-23 MED ORDER — SODIUM CHLORIDE 0.9 % IV SOLN
510.0000 mg | Freq: Once | INTRAVENOUS | Status: AC
  Administered 2022-02-23: 510 mg via INTRAVENOUS
  Filled 2022-02-23: qty 510

## 2022-02-23 MED ORDER — SODIUM CHLORIDE 0.9 % IV SOLN: Freq: Once | INTRAVENOUS | Status: AC

## 2022-02-23 NOTE — Progress Notes (Signed)
Patient presents today for iron infusion.  Patient is in satisfactory condition with no complaints voiced.  Vital signs are stable.  IV placed in right AC.  IV flushed well with good blood return noted.  We will proceed with infusion per provider orders.   Patient tolerated treatment well with no complaints voiced.  Patient left ambulatory in stable condition.  Vital signs stable at discharge.  Follow up as scheduled.

## 2022-02-23 NOTE — H&P (Signed)
Chief Complaint: Patient was seen in consultation today for bladder cancer; port-a-catheter placement.   Referring Physician(s): Firefighter  Supervising Physician: {Supervising Physician:[id number]}  Patient Status: [facility] - Out-pt  History of Present Illness: [name] is a 77 y.o. male with a medical history significant for CAD s/p CABG x2, HTN, prostate cancer, CKD, chronic neuropathy, OSA on CPAP and bladder cancer initially diagnosed in 2016. He underwent TURBT in 2020, 2021, 2022 and 2023 in conjunction with multiple rounds of intravesical therapies.   A cystoscopy 12/30/21 showed lesions throughout the bladder with muscle invasion. Surgical options were discussed but the patient wishes to pursue non-surgical options. His oncology team is preparing him for chemotherapy and he will require durable venous access.   Interventional Radiology has been asked to evaluate this patient for an image-guided port-a-catheter placement to facilitate his treatment plans.   Past Medical History:  Diagnosis Date   Anginal pain ([place])    Arthritis    "all over"   Bladder tumor    Chronic lower back pain    Coronary artery disease    a. s/p CABG x 2 (LIMA->LAD, RIMA->RCA);  b. s/p multiple PCI's to Ramus;  c. 08/2011 Cath/PCI: LM 70% into ramus with 70-80% there->treated wtih 3.5x18 Xience Xpedition DES, LCX  nonobs, RCA occluded.  RIMA & LIMA patent, EF 55-65%   DJD (degenerative joint disease)    GERD (gastroesophageal reflux disease)    History of gout    Hyperlipidemia    Hypertension    Hypothyroidism    Neuropathy    OSA on CPAP    Prostate cancer [facility])     Past Surgical History:  Procedure Laterality Date   ABDOMINAL HERNIA REPAIR     BIOPSY  11/26/2015   Procedure: BIOPSY;  Surgeon: Rogene Houston, MD;  [facility];  Service: Endoscopy;;  gastric esophagus   BIOPSY  01/20/2021   Procedure: BIOPSY;  Surgeon: Harvel Quale, MD;  [facility]: [facility];  Service: Gastroenterology;;   BIOPSY  10/28/2021   Procedure: BIOPSY;  Surgeon: Harvel Quale, MD;  [facility];  Service: Gastroenterology;;   BLADDER TUMOR EXCISION     BROW LIFT Bilateral 04/27/2019   Procedure: BILATERAL BLEPHAROPLASTY;  Surgeon: Baruch Goldmann, MD;  Location: [facility];  Service: Ophthalmology;  Laterality: Bilateral;   CARDIAC CATHETERIZATION N/A 04/10/2015   Procedure: Left Heart Cath and Cors/Grafts Angiography;  Surgeon: Sherren Mocha, MD;  Location: [place] [facility] LAB;  Service: Cardiovascular;  Laterality: N/A;   CARDIAC CATHETERIZATION N/A 04/10/2015   Procedure: Coronary Stent Intervention;  Surgeon: Sherren Mocha, MD;  Location: [place] [facility];  Service: Cardiovascular;  Laterality: N/A;   CATARACT EXTRACTION W/PHACO Right 06/23/2015   Procedure: CATARACT EXTRACTION PHACO AND INTRAOCULAR LENS PLACEMENT RIGHT EYE; CDE:  7.08;  Surgeon: Tonny Branch, MD;  Location: [facility];  Service: Ophthalmology;  Laterality: Right;   CATARACT EXTRACTION W/PHACO Left 08/25/2015   Procedure: CATARACT EXTRACTION PHACO AND INTRAOCULAR LENS PLACEMENT LEFT EYE; CDE:  8.18;  Surgeon: Tonny Branch, MD;  Location: [facility];  Service: Ophthalmology;  Laterality: Left;   COLONOSCOPY N/A 12/28/2012   Procedure: COLONOSCOPY;  Surgeon: Rogene Houston, MD;  [facility];  Service: Endoscopy;  Laterality: N/A;  830 rescheduled   COLONOSCOPY WITH PROPOFOL N/A 01/20/2021   Procedure: COLONOSCOPY WITH PROPOFOL;  Surgeon: Harvel Quale, MD;  [facility];  Service: Gastroenterology;  Laterality: N/A;  11:05  CORONARY ANGIOPLASTY WITH STENT PLACEMENT  2013; 04/10/2015   "this makes me a total of 7" (04/10/2015)   CORONARY ARTERY BYPASS GRAFT  1997   with (LIMA)   ESOPHAGEAL DILATION N/A 11/26/2015   Procedure: ESOPHAGEAL DILATION;  Surgeon: Rogene Houston, MD;  [facility];  Service: Endoscopy;  Laterality: N/A;   ESOPHAGEAL  DILATION N/A 05/23/2017   Procedure: ESOPHAGEAL DILATION;  Surgeon: Rogene Houston, MD;  [facility];  Service: Endoscopy;  Laterality: N/A;   ESOPHAGEAL DILATION N/A 06/16/2017   Procedure: ESOPHAGEAL DILATION;  Surgeon: Rogene Houston, MD;  [facility];  Service: Endoscopy;  Laterality: N/A;   ESOPHAGEAL DILATION  10/28/2021   Procedure: ESOPHAGEAL DILATION;  Surgeon: Harvel Quale, MD;  [facility];  Service: Gastroenterology;;   ESOPHAGOGASTRODUODENOSCOPY N/A 11/26/2015   Procedure: ESOPHAGOGASTRODUODENOSCOPY (EGD);  Surgeon: Rogene Houston, MD;  [facility];  Service: Endoscopy;  Laterality: N/A;  1:25   ESOPHAGOGASTRODUODENOSCOPY N/A 05/23/2017   Procedure: ESOPHAGOGASTRODUODENOSCOPY (EGD);  Surgeon: Rogene Houston, MD;  [facility];  Service: Endoscopy;  Laterality: N/A;  1:15   ESOPHAGOGASTRODUODENOSCOPY N/A 06/16/2017   Procedure: ESOPHAGOGASTRODUODENOSCOPY (EGD);  Surgeon: Rogene Houston, MD;  [facility];  Service: Endoscopy;  Laterality: N/A;  1240   ESOPHAGOGASTRODUODENOSCOPY (EGD) WITH PROPOFOL N/A 01/20/2021   Procedure: ESOPHAGOGASTRODUODENOSCOPY (EGD) WITH PROPOFOL;  Surgeon: Harvel Quale, MD;  [facility];  Service: Gastroenterology;  Laterality: N/A;   ESOPHAGOGASTRODUODENOSCOPY (EGD) WITH PROPOFOL N/A 10/28/2021   Procedure: ESOPHAGOGASTRODUODENOSCOPY (EGD) WITH PROPOFOL;  Surgeon: Harvel Quale, MD;  [facility];  Service: Gastroenterology;  Laterality: N/A;  215 ASA 2, pt will arrive at 10:45   HERNIA REPAIR Left    KNEE CARTILAGE SURGERY Bilateral    LEFT HEART CATH AND CORS/GRAFTS ANGIOGRAPHY N/A 09/08/2016   Procedure: LEFT HEART CATH AND CORS/GRAFTS ANGIOGRAPHY;  Surgeon: Martinique, Peter M, MD;  Location: [place] [facility];  Service: Cardiovascular;  Laterality: N/A;   LEFT HEART CATHETERIZATION WITH CORONARY ANGIOGRAM N/A 09/08/2011    Procedure: LEFT HEART CATHETERIZATION WITH CORONARY ANGIOGRAM;  Surgeon: Sherren Mocha, MD;  Location: [facility] CATH LAB;  Service: Cardiovascular;  Laterality: N/A;   PERCUTANEOUS CORONARY STENT INTERVENTION (PCI-S) Right 09/08/2011   Procedure: PERCUTANEOUS CORONARY STENT INTERVENTION (PCI-S);  Surgeon: Sherren Mocha, MD;  Location: [facility] CATH LAB;  Service: Cardiovascular;  Laterality: Right;   POLYPECTOMY  01/20/2021   Procedure: POLYPECTOMY;  Surgeon: Harvel Quale, MD;  [facility];  Service: Gastroenterology;;   ROBOT ASSISTED LAPAROSCOPIC RADICAL PROSTATECTOMY      Allergies: Codeine and Pollen extract  Medications: Prior to Admission medications   Medication Sig Start Date End Date Taking? Authorizing Provider  amLODipine (NORVASC) 10 MG tablet Take 10 mg by mouth in the morning. 04/20/20   [provider]  aspirin EC 81 MG tablet Take 81 mg by mouth every evening. 06/17/17   Rehman, Mechele Dawley, MD  atorvastatin (LIPITOR) 40 MG tablet Take 0.5 tablets (20 mg total) by mouth every evening. 01/14/22   Sherren Mocha, MD  BLACK CURRANT SEED OIL PO Take 1 each by mouth 2 (two) times a week. 1 teaspoonful twice weekly    [provider]  carvedilol (COREG) 6.25 MG tablet Take 1 tablet (6.25 mg total) by mouth 2 (two) times daily. 11/30/21   Finis Bud, NP  celecoxib (CELEBREX) 200 MG capsule Take 200 mg by mouth in  the morning. 01/26/12   Hillary Bow, MD  cetirizine (ZYRTEC) 10 MG tablet Take 10 mg by mouth every evening.    [provider]  cholecalciferol (VITAMIN D3) 25 MCG (1000 UNIT) tablet Take 1,000 Units by mouth every evening.    [provider]  clopidogrel (PLAVIX) 75 MG tablet Take 1 tablet (75 mg total) by mouth in the morning. 09/24/20   Bhagat, Crista Luria, PA  Coenzyme Q10 300 MG CAPS Take 300 mg by mouth every evening.    [provider]  cyanocobalamin (,VITAMIN B-12,) 1000 MCG/ML injection Inject 1,000 mcg  into the muscle See admin instructions. Inject (1000 mcg) intramuscularly once monthly as needed for B-12 deficiency. 03/25/15   [provider]  fenofibrate 160 MG tablet Take 1 tablet (160 mg total) by mouth daily. Patient taking differently: Take 160 mg by mouth every evening. 10/09/21   Finis Bud, NP  FEROSUL 325 (65 Fe) MG tablet TAKE 1 TABLET(325 MG) BY MOUTH TWICE DAILY WITH A MEAL 01/13/22   Harvel Quale, MD  gabapentin (NEURONTIN) 600 MG tablet Take 600 mg by mouth 3 (three) times daily. 02/27/15   [provider]  GLUCOSAMINE-CHONDROITIN PO Take 1 tablet by mouth every evening.    [provider]  isosorbide mononitrate (IMDUR) 60 MG 24 hr tablet Take 1.5 tablets (90 mg total) by mouth daily. Patient taking differently: Take 90 mg by mouth every evening. 10/06/21   Finis Bud, NP  levothyroxine (SYNTHROID, LEVOTHROID) 150 MCG tablet Take 150 mcg by mouth daily before breakfast.    [provider]  meclizine (ANTIVERT) 25 MG tablet Take 1 tablet (25 mg total) by mouth 2 (two) times daily as needed for dizziness. Patient taking differently: Take 12.5 mg by mouth at bedtime as needed for dizziness. 05/29/20   Little Ishikawa, MD  nitroGLYCERIN (NITROSTAT) 0.4 MG SL tablet Place 1 tablet (0.4 mg total) under the tongue every 5 (five) minutes as needed for chest pain. 12/31/21   Sherren Mocha, MD  Omega-3 Fatty Acids (FISH OIL) 1000 MG CAPS Take 1 capsule by mouth in the morning. 06/16/17   Rehman, Mechele Dawley, MD  Polyethyl Glycol-Propyl Glycol (LUBRICANT EYE DROPS) 0.4-0.3 % SOLN Place 1-2 drops into both eyes 3 (three) times daily as needed (dry/irritated eyes.).    [provider]  psyllium (METAMUCIL) 58.6 % powder Take 1 packet by mouth 2 (two) times a week.    [provider]  traMADol (ULTRAM) 50 MG tablet Take 50 mg by mouth 3 (three) times daily. 12/24/13   [provider]     Family History  Problem  Relation Age of Onset   Cancer Father 51       died   Stroke Mother 71       died    Social History   Socioeconomic History   Marital status: Widowed    Spouse name: Not on file   Number of children: Not on file   Years of education: Not on file   Highest education level: Not on file  Occupational History   Occupation: semi-retired  Tobacco Use   Smoking status: Former    Types: Cigars    Passive exposure: Past   Smokeless tobacco: Never   Tobacco comments:    "quit smoking in ~ 1978"  Vaping Use   Vaping Use: Never used  Substance and Sexual Activity   Alcohol use: Yes    Comment: occasionally    Drug use: No  Sexual activity: Not Currently  Other Topics Concern   Not on file  Social History Narrative   Not on file   Social Determinants of Health   Financial Resource Strain: Low Risk  (02/18/2022)   Overall Financial Resource Strain (CARDIA)    Difficulty of Paying Living Expenses: Not hard at all  Food Insecurity: No Food Insecurity (02/04/2022)   Hunger Vital Sign    Worried About Running Out of Food in the Last Year: Never true    Ran Out of Food in the Last Year: Never true  Transportation Needs: No Transportation Needs (02/18/2022)   PRAPARE - Hydrologist (Medical): No    Lack of Transportation (Non-Medical): No  Physical Activity: Not on file  Stress: Not on file  Social Connections: Not on file    Review of Systems: A 12 point ROS discussed and pertinent positives are indicated in the HPI above.  All other systems are negative.  Review of Systems  Vital Signs: There were no vitals taken for this visit.  Physical Exam  Imaging: NM Bone Scan Whole Body  Result Date: 02/10/2022 CLINICAL DATA:  History of bladder cancer for 3 years EXAM: NUCLEAR MEDICINE WHOLE BODY BONE SCAN TECHNIQUE: Whole body anterior and posterior images were obtained approximately 3 hours after intravenous injection of radiopharmaceutical.  RADIOPHARMACEUTICALS:  18.5 mCi Technetium-56mMDP IV COMPARISON:  01/19/2006 FINDINGS: Adequate uptake is noted throughout the bony skeleton. Mild increased activity is noted in the medial left knee joint consistent with degenerative change. No focal area of increased activity is identified to suggest metastatic disease. Mild degenerative changes in the thoracolumbar spine are seen as well. IMPRESSION: No evidence of metastatic disease. Mild degenerative changes are noted as described. Electronically Signed   By: MInez CatalinaM.D.   On: 02/10/2022 01:49   CT ABDOMEN PELVIS W WO CONTRAST  Result Date: 02/09/2022 CLINICAL DATA:  History of prostate cancer, follow-up for cystoscopy which showed lesions throughout the bladder and muscle invasive disease. * Tracking Code: BO * EXAM: CT ABDOMEN AND PELVIS WITHOUT AND WITH CONTRAST TECHNIQUE: Multidetector CT imaging of the abdomen and pelvis was performed following the standard protocol before and following the bolus administration of intravenous contrast. RADIATION DOSE REDUCTION: This exam was performed according to the departmental dose-optimization program which includes automated exposure control, adjustment of the mA and/or kV according to patient size and/or use of iterative reconstruction technique. CONTRAST:  74mOMNIPAQUE IOHEXOL 350 MG/ML SOLN COMPARISON:  November 01, 2019. FINDINGS: Lower chest: Lung bases are clear. No effusion. Tiny LEFT lower lobe pulmonary nodule unchanged compared to previous imaging 3 mm (image 7/4) no effusion. No consolidative changes. Hepatobiliary: Mild hepatic steatosis is suggested. No focal, suspicious hepatic lesion. Fissural widening of hepatic fissures. No pericholecystic stranding or biliary duct dilation. Portal vein is patent. Pancreas: Pancreatic atrophy without signs of inflammation or ductal dilation similar to previous imaging. Spleen: Normal. Adrenals/Urinary Tract: Adrenal glands are normal. Mild cortical  scarring of the bilateral kidneys. No signs of abnormal enhancement along the course of LEFT or RIGHT ureter. No suspicious renal lesion with stable Bosniak category II cyst arising upper pole the RIGHT kidney. This measures approximately 4.8 cm. Smaller cyst in the lower pole also compatible with benign renal cyst. Perivesical stranding with irregular thickening of the RIGHT greater than LEFT urinary bladder wall. No discrete masslike lesion. Mildly heterogeneous enhancement. Post prostatectomy. Stomach/Bowel: Colonic diverticulosis and diverticular disease worse in the sigmoid similar to prior  imaging. No acute gastrointestinal findings. Normal appendix. Vascular/Lymphatic: Aortic atherosclerosis. No sign of aneurysm. Smooth contour of the IVC. There is no gastrohepatic or hepatoduodenal ligament lymphadenopathy. No retroperitoneal or mesenteric lymphadenopathy. No pelvic sidewall lymphadenopathy. Reproductive: Post prostatectomy. Other: No ascites. Musculoskeletal: No acute bone finding. No destructive bone process. Spinal degenerative changes. Excretory phase: Urinary bladder is incompletely opacified on excretory phase. Mildly patulous appearance of the distal LEFT ureter also incompletely opacified. Peripelvic stranding on the LEFT. No filling defect in the upper tracts to indicate upper tract disease. Bifid renal pelvis on the RIGHT. IMPRESSION: 1. Perivesical stranding with irregular thickening of the RIGHT greater than LEFT urinary bladder wall. No discrete masslike lesion. Mildly heterogeneous enhancement of the urinary bladder. Findings may be related to post treatment related changes. Correlate with any signs of cystitis. Cystoscopic correlation may also be helpful if not recently performed. 2. No filling defect in the upper tracts to indicate upper tract disease. No secondary sign of stricture. 3. Post prostatectomy. 4. Mild hepatic steatosis is suggested. Fissural widening of hepatic fissures.  Correlate with any clinical or laboratory evidence of liver disease. 5. Colonic diverticulosis and diverticular disease worse in the sigmoid similar to prior imaging. 6. Tiny LEFT lower lobe pulmonary nodule unchanged compared to previous imaging 3 mm. Compatible with benign pulmonary nodule. 7. Aortic atherosclerosis. Aortic Atherosclerosis (ICD10-I70.0). Electronically Signed   By: Zetta Bills M.D.   On: 02/09/2022 10:41   CT CHEST W CONTRAST  Result Date: 02/09/2022 CLINICAL DATA:  Prostate cancer. Follow-up for cystoscopy with multiple lesions in bladder. EXAM: CT CHEST WITH CONTRAST CT ABDOMEN WITHOUT AND WITH CONTRAST TECHNIQUE: Multidetector CT imaging of the abdomen was performed without intravenous contrast. Multidetector CT imaging of the chest and abdomen was then performed during bolus administration of intravenous contrast. RADIATION DOSE REDUCTION: This exam was performed according to the departmental dose-optimization program which includes automated exposure control, adjustment of the mA and/or kV according to patient size and/or use of iterative reconstruction technique. CONTRAST:  20m OMNIPAQUE IOHEXOL 350 MG/ML SOLN COMPARISON:  07/05/2013, 11/02/2019. FINDINGS: CT CHEST WITH CONTRAST Cardiovascular: The heart is normal in size and there is no pericardial effusion. Three-vessel coronary artery calcifications are noted. There is atherosclerotic calcification of the aorta without evidence of aneurysm. The pulmonary trunk is normal in caliber. Mediastinum/Nodes: No mediastinal, hilar, or axillary lymphadenopathy. The thyroid gland, trachea, and esophagus are within normal limits. Lungs/Pleura: Mild atelectasis is present bilaterally. There is a 3 mm nodule in the left lower lobe, axial image 113, unchanged. There is a 6 mm nodule in the right middle lobe, axial image 87, unchanged from 2015 and likely benign. No effusion or pneumothorax. Musculoskeletal: Sternotomy wires are noted.  Degenerative changes in the thoracic spine. No acute or suspicious osseous abnormality. Confluence osteophytes are present along the anterior longitudinal ligament which may be associated with diffuse idiopathic skeletal hyperostosis. CT ABDOMEN WITHOUT AND WITH CONTRAST Hepatobiliary: No focal liver abnormality is seen. Hepatic steatosis is noted. No gallstones, gallbladder wall thickening, or biliary dilatation. Pancreas: Pancreatic atrophy. No pancreatic ductal dilatation or surrounding inflammatory changes. Spleen: Normal in size without focal abnormality. Adrenals/Urinary Tract: No adrenal nodule or mass. The kidneys enhance symmetrically. Renal cysts are noted on the right. No renal calculus or hydronephrosis. No ureteral calculus or obstructive uropathy. There is diffuse irregular bladder wall thickening with perivesicular fat stranding. A small enhancing focus is noted in the anterior aspect of the bladder wall on the right on portal venous phase. No  definite intraluminal enhancement is seen. Prostate gland is surgically absent. Stomach/Bowel: Stomach is within normal limits. Appendix appears normal. No evidence of bowel wall thickening, distention, or inflammatory changes. No free air or pneumatosis. Multiple scattered diverticula are present along the colon without evidence of diverticulitis. Vascular/Lymphatic: Aortic atherosclerosis. No enlarged abdominal or pelvic lymph nodes. Other: No abdominopelvic ascites. Fat containing inguinal hernias are noted bilaterally. Fat containing umbilical hernia. Musculoskeletal: Degenerative changes in the thoracolumbar spine. There is moderate-to-severe spinal canal stenosis at L2-L3. No acute or suspicious osseous abnormality. IMPRESSION: 1. Diffuse irregular bladder wall thickening with perivesicular fat stranding. No intraluminal filling defect is seen. A tiny hypervascular focus is noted in the anterior bladder wall on the right only on portal venous phase.  Residual urothelial neoplasm can not be excluded. 2. Status prostatectomy. No lymphadenopathy or other evidence of metastatic disease. 3. Stable bilateral pulmonary nodules, unchanged from 2015 and likely benign. 4. Hepatic steatosis. 5. Aortic atherosclerosis and coronary artery calcifications. Electronically Signed   By: Brett Fairy M.D.   On: 02/09/2022 01:03    Labs:  CBC: Recent Labs    10/06/21 1542 12/14/21 1124 02/04/22 1007  WBC 5.7 5.7 5.3  HGB 12.1* 11.9* 10.4*  HCT 35.1* 35.1* 31.7*  PLT 176 207 186    COAGS: No results for input(s): "INR", "APTT" in the last 8760 hours.  BMP: Recent Labs    10/06/21 1542 11/11/21 0000 12/14/21 1124 02/04/22 1007  NA 143 145* 138 139  K 4.2 4.4 4.6 4.3  CL 103 103 103 104  CO2 24 27 29 26  $ GLUCOSE 118* 108* 91 109*  BUN 19 19 26* 20  CALCIUM 9.5 9.1 9.3 8.8*  CREATININE 1.30* 1.57* 1.69* 1.35*  GFRNONAA  --   --   --  54*    LIVER FUNCTION TESTS: Recent Labs    10/06/21 1542 12/14/21 1124 02/04/22 1007  BILITOT 0.3 0.4 0.6  AST 18 20 27  $ ALT 19 25 27  $ ALKPHOS 78  --  40  PROT 6.9 6.8 6.8  ALBUMIN 4.5  --  3.8    TUMOR MARKERS: No results for input(s): "AFPTM", "CEA", "CA199", "CHROMGRNA" in the last 8760 hours.  Assessment and Plan:  Bladder cancer; pending chemotherapy - durable venous access required: [name]. [place], 77 year old male, presents today to the [facility] department for an image-guided port-a-catheter placement.  Risks and benefits of image-guided Port-a-catheter placement were discussed with the patient including, but not limited to bleeding, infection, pneumothorax, or fibrin sheath development and need for additional procedures.  All of the patient's questions were answered, patient is agreeable to proceed. He has been NPO.   Consent signed and in chart.  Thank you for this interesting consult.  I greatly enjoyed meeting [name] and look forward to  participating in their care.  A copy of this report was sent to the requesting provider on this date.  Electronically Signed: Soyla Dryer, AGACNP-BC [phone] 02/23/2022, 10:25 AM   I spent a total of  30 Minutes   in face to face in clinical consultation, greater than 50% of which was counseling/coordinating care for port-a-catheter placement

## 2022-02-23 NOTE — Patient Instructions (Signed)
[facility]  Discharge Instructions: Thank you for choosing [facility] to provide your oncology and hematology care.  If you have a lab appointment with the [facility], please come in thru the Main Entrance and check in at the main information desk.  Wear comfortable clothing and clothing appropriate for easy access to any Portacath or PICC line.   We strive to give you quality time with your provider. You may need to reschedule your appointment if you arrive late (15 or more minutes).  Arriving late affects you and other patients whose appointments are after yours.  Also, if you miss three or more appointments without notifying the office, you may be dismissed from the clinic at the provider's discretion.      For prescription refill requests, have your pharmacy contact our office and allow 72 hours for refills to be completed.    Ferumoxytol Injection What is this medication? FERUMOXYTOL (FER ue MOX i tol) treats low levels of iron in your body (iron deficiency anemia). Iron is a mineral that plays an important role in making red blood cells, which carry oxygen from your lungs to the rest of your body. This medicine may be used for other purposes; ask your health care provider or pharmacist if you have questions. COMMON BRAND NAME(S): Feraheme What should I tell my care team before I take this medication? They need to know if you have any of these conditions: Anemia not caused by low iron levels High levels of iron in the blood Magnetic resonance imaging (MRI) test scheduled An unusual or allergic reaction to iron, other medications, foods, dyes, or preservatives Pregnant or trying to get pregnant Breastfeeding How should I use this medication? This medication is injected into a vein. It is given by your care team in a hospital or clinic setting. Talk to your care team the use of this medication in children. Special care may be needed. Overdosage:  If you think you have taken too much of this medicine contact a poison control center or emergency room at once. NOTE: This medicine is only for you. Do not share this medicine with others. What if I miss a dose? It is important not to miss your dose. Call your care team if you are unable to keep an appointment. What may interact with this medication? Other iron products This list may not describe all possible interactions. Give your health care provider a list of all the medicines, herbs, non-prescription drugs, or dietary supplements you use. Also tell them if you smoke, drink alcohol, or use illegal drugs. Some items may interact with your medicine. What should I watch for while using this medication? Visit your care team regularly. Tell your care team if your symptoms do not start to get better or if they get worse. You may need blood work done while you are taking this medication. You may need to follow a special diet. Talk to your care team. Foods that contain iron include: whole grains/cereals, dried fruits, beans, or peas, leafy green vegetables, and organ meats (liver, kidney). What side effects may I notice from receiving this medication? Side effects that you should report to your care team as soon as possible: Allergic reactions--skin rash, itching, hives, swelling of the face, lips, tongue, or throat Low blood pressure--dizziness, feeling faint or lightheaded, blurry vision Shortness of breath Side effects that usually do not require medical attention (report to your care team if they continue or are bothersome): Flushing Headache  Joint pain Muscle pain Nausea Pain, redness, or irritation at injection site This list may not describe all possible side effects. Call your doctor for medical advice about side effects. You may report side effects to FDA at 1-800-FDA-1088. Where should I keep my medication? This medication is given in a hospital or clinic and will not be stored at  home. NOTE: This sheet is a summary. It may not cover all possible information. If you have questions about this medicine, talk to your doctor, pharmacist, or health care provider.  2023 Elsevier/Gold Standard (2020-05-21 00:00:00)    To help prevent nausea and vomiting after your treatment, we encourage you to take your nausea medication as directed.  BELOW ARE SYMPTOMS THAT SHOULD BE REPORTED IMMEDIATELY: *FEVER GREATER THAN 100.4 F (38 C) OR HIGHER *CHILLS OR SWEATING *NAUSEA AND VOMITING THAT IS NOT CONTROLLED WITH YOUR NAUSEA MEDICATION *UNUSUAL SHORTNESS OF BREATH *UNUSUAL BRUISING OR BLEEDING *URINARY PROBLEMS (pain or burning when urinating, or frequent urination) *BOWEL PROBLEMS (unusual diarrhea, constipation, pain near the anus) TENDERNESS IN MOUTH AND THROAT WITH OR WITHOUT PRESENCE OF ULCERS (sore throat, sores in mouth, or a toothache) UNUSUAL RASH, SWELLING OR PAIN  UNUSUAL VAGINAL DISCHARGE OR ITCHING   Items with * indicate a potential emergency and should be followed up as soon as possible or go to the Emergency Department if any problems should occur.  Please show the CHEMOTHERAPY ALERT CARD or IMMUNOTHERAPY ALERT CARD at check-in to the Emergency Department and triage nurse.  Should you have questions after your visit or need to cancel or reschedule your appointment, please contact [facility] [phone]  and follow the prompts.  Office hours are 8:00 a.m. to 4:30 p.m. Monday - Friday. Please note that voicemails left after 4:00 p.m. may not be returned until the following business day.  We are closed weekends and major holidays. You have access to a nurse at all times for urgent questions. Please call the main number to the clinic [phone] and follow the prompts.  For any non-urgent questions, you may also contact your provider using MyChart. We now offer e-Visits for anyone 56 and older to request care online for non-urgent symptoms. For  details visit [email or web address].   Also download the MyChart app! Go to the app store, search "MyChart", open the app, select , and log in with your MyChart username and password.

## 2022-02-24 ENCOUNTER — Other Ambulatory Visit: Payer: Self-pay

## 2022-02-24 ENCOUNTER — Ambulatory Visit: Payer: Medicare Other | Admitting: Urology

## 2022-02-24 ENCOUNTER — Ambulatory Visit (HOSPITAL_COMMUNITY)
Admission: RE | Admit: 2022-02-24 | Discharge: 2022-02-24 | Disposition: A | Discharge status: Home / Self Care | Payer: Medicare Other | Source: Ambulatory Visit | Attending: Hematology | Admitting: Hematology

## 2022-02-24 DIAGNOSIS — I129 Hypertensive chronic kidney disease with stage 1 through stage 4 chronic kidney disease, or unspecified chronic kidney disease: Secondary | ICD-10-CM | POA: Diagnosis not present

## 2022-02-24 DIAGNOSIS — Z87891 Personal history of nicotine dependence: Secondary | ICD-10-CM | POA: Insufficient documentation

## 2022-02-24 DIAGNOSIS — G629 Polyneuropathy, unspecified: Secondary | ICD-10-CM | POA: Insufficient documentation

## 2022-02-24 DIAGNOSIS — I2510 Atherosclerotic heart disease of native coronary artery without angina pectoris: Secondary | ICD-10-CM | POA: Diagnosis not present

## 2022-02-24 DIAGNOSIS — Z8546 Personal history of malignant neoplasm of prostate: Secondary | ICD-10-CM | POA: Diagnosis not present

## 2022-02-24 DIAGNOSIS — G4733 Obstructive sleep apnea (adult) (pediatric): Secondary | ICD-10-CM | POA: Diagnosis not present

## 2022-02-24 DIAGNOSIS — Z951 Presence of aortocoronary bypass graft: Secondary | ICD-10-CM | POA: Insufficient documentation

## 2022-02-24 DIAGNOSIS — C678 Malignant neoplasm of overlapping sites of bladder: Secondary | ICD-10-CM | POA: Diagnosis not present

## 2022-02-24 DIAGNOSIS — C679 Malignant neoplasm of bladder, unspecified: Secondary | ICD-10-CM | POA: Diagnosis not present

## 2022-02-24 DIAGNOSIS — N189 Chronic kidney disease, unspecified: Secondary | ICD-10-CM | POA: Insufficient documentation

## 2022-02-24 DIAGNOSIS — Z452 Encounter for adjustment and management of vascular access device: Secondary | ICD-10-CM | POA: Diagnosis not present

## 2022-02-24 HISTORY — PX: IR IMAGING GUIDED PORT INSERTION: IMG5740

## 2022-02-24 MED ORDER — MIDAZOLAM HCL 2 MG/2ML IJ SOLN
INTRAMUSCULAR | Status: AC
  Filled 2022-02-24: qty 2

## 2022-02-24 MED ORDER — FENTANYL CITRATE (PF) 100 MCG/2ML IJ SOLN
INTRAMUSCULAR | Status: AC | PRN
  Administered 2022-02-24: 25 ug via INTRAVENOUS
  Administered 2022-02-24: 50 ug via INTRAVENOUS

## 2022-02-24 MED ORDER — SODIUM CHLORIDE 0.9 % IV SOLN: INTRAVENOUS | Status: DC

## 2022-02-24 MED ORDER — LIDOCAINE HCL 1 % IJ SOLN
INTRAMUSCULAR | Status: AC
  Administered 2022-02-24: 10 mL
  Filled 2022-02-24: qty 20

## 2022-02-24 MED ORDER — HEPARIN SOD (PORK) LOCK FLUSH 100 UNIT/ML IV SOLN
INTRAVENOUS | Status: AC
  Administered 2022-02-24: 500 [IU]
  Filled 2022-02-24: qty 5

## 2022-02-24 MED ORDER — LORAZEPAM 2 MG/ML IJ SOLN: INTRAMUSCULAR | Status: AC | PRN

## 2022-02-24 MED ORDER — MIDAZOLAM HCL 2 MG/2ML IJ SOLN
INTRAMUSCULAR | Status: AC | PRN
  Administered 2022-02-24: .5 mg via INTRAVENOUS
  Administered 2022-02-24: 1 mg via INTRAVENOUS

## 2022-02-24 MED ORDER — FENTANYL CITRATE (PF) 100 MCG/2ML IJ SOLN
INTRAMUSCULAR | Status: AC
  Filled 2022-02-24: qty 2

## 2022-02-24 NOTE — Procedures (Signed)
  Procedure:  R IJ port catheter placement   Preprocedure diagnosis: The encounter diagnosis was Malignant neoplasm of overlapping sites of bladder ([place]).  Postprocedure diagnosis: same EBL:    minimal Complications:   none immediate  See full dictation in [employer].  Dillard Cannon MD Main # [phone] Pager  [phone] Mobile [phone]

## 2022-02-28 ENCOUNTER — Other Ambulatory Visit: Payer: Self-pay | Admitting: Cardiovascular Disease

## 2022-03-01 ENCOUNTER — Ambulatory Visit: Payer: Medicare Other | Admitting: Radiation Oncology

## 2022-03-02 ENCOUNTER — Telehealth: Payer: Self-pay

## 2022-03-02 ENCOUNTER — Inpatient Hospital Stay: Payer: Medicare Other

## 2022-03-02 NOTE — Telephone Encounter (Signed)
RN called [name] (daughter) she had concerns about getting her father Keyon Sada started on radiation treatment however he is scheduled for 03/26/2022 for CT simulation.  Mr. Urwin is scheduled for bladder ablation 03/12/2022 with his urologist and he will need 2-3 weeks healing time before seeing [place] here at the cancer center.  Left message for [name] to call back if need.

## 2022-03-03 ENCOUNTER — Ambulatory Visit: Payer: Self-pay | Admitting: *Deleted

## 2022-03-03 ENCOUNTER — Encounter: Payer: Self-pay | Admitting: *Deleted

## 2022-03-03 NOTE — Patient Outreach (Signed)
03/03/2022  Erroneous encounter. Please disregard. See CCM Telephone encounter with same date.

## 2022-03-03 NOTE — Patient Outreach (Signed)
  Care Coordination   Follow Up Visit Note   03/03/2022 Name: [name] MRN: [id number] DOB: 12-08-1945  [name] is a 77 y.o. year old male who sees Sharilyn Sites, MD for primary care. I  reached out to patient/daughter via My Chart Message regarding a telephone call from daughter, [name], requesting assistance with coordinating chemotherapy appointment.   What matters to the patients health and wellness today?  Scheduling chemotherapy within two weeks of cystoscopy with TURBT   Goals Addressed             This Visit's Progress    Care Coordination Services r/t Bladder Cancer       Care Coordination Goals: Patient will keep appointment with Dr Dyanne Iha on 03/12/22 for cystoscopy and TURBT Patient will be able to schedule a consult with Dr Delton Coombes and begin chemo within two weeks of this procedure Patient/daughter will reach out to this [facility] at [phone] or reply via My Chart Message with any questions or concerns regarding scheduling or other care coordination needs        SDOH assessments and interventions completed:  No    Care Coordination Interventions:  Yes, provided  Interventions Today    Flowsheet Row Most Recent Value  Chronic Disease   Chronic disease during today's visit Other  [Bladder Cancer]  General Interventions   General Interventions Discussed/Reviewed Communication with  Communication with PCP/Specialists  [reached out to Dr Delton Coombes via Staff Message regarding need to rescedule consultation, chemo educacation, and chemotherapy within two weeks after cystoscopy with Dr Dyanne Iha on 03/12/22]       Follow up plan: Follow up call scheduled for 03/11/22 Awaiting return call or My Chart message from patient or daughter, [name].     Encounter Outcome:  Pt. Visit Completed   [name], BSN, RN-BC RN Care Coordinator [place] Direct Dial: [phone] Main #: [phone]

## 2022-03-04 ENCOUNTER — Encounter: Payer: Self-pay | Admitting: *Deleted

## 2022-03-04 ENCOUNTER — Ambulatory Visit: Payer: Self-pay | Admitting: *Deleted

## 2022-03-04 NOTE — Patient Outreach (Signed)
  Care Coordination   Follow Up Visit Note   03/04/2022 Name: [name] MRN: [id number] DOB: 1945-10-26  [name] is a 77 y.o. year old male who sees Sharilyn Sites, MD for primary care. I  spoke with daughter, [name], by telephone today.  What matters to the patients health and wellness today?  Managing appts for bladder cancer treatment    Goals Addressed             This Visit's Progress    Care Coordination Services r/t Bladder Cancer       Care Coordination Goals: Patient will keep appointment with Dr Dyanne Iha on 03/12/22 for cystoscopy and TURBT Patient will keep the following appointments: Dr Tammi Klippel for [place] on 03/26/22, Dr Delton Coombes on 03/31/22, [place] for chemo education on 04/01/22 which chemo start date of 04/05/22, and Dr Alyson Ingles (urology) on 04/02/22 Patient/daughter will reach out to specialists with any new or worsening symptoms Patient/daughter will reach out to this [facility] at [phone] or reply via My Chart Message with any questions or concerns regarding scheduling or other care coordination needs        SDOH assessments and interventions completed:  No     Care Coordination Interventions:  Yes, provided  Interventions Today    Flowsheet Row Most Recent Value  Chronic Disease   Chronic disease during today's visit Other  [bladder cancer]  General Interventions   General Interventions Discussed/Reviewed Doctor Visits, General Interventions Discussed, General Interventions Reviewed  [name], regarding Plan of Care per notes from Dr Johny Shears office and upcoming appointments.]  Doctor Visits Discussed/Reviewed Doctor Visits Reviewed, Doctor Visits Discussed       Follow up plan: Follow up call scheduled for 03/11/22    Encounter Outcome:  Pt. Visit Completed   [name], BSN, RN-BC RN Care Coordinator [place]: [phone] Main #: [phone]

## 2022-03-08 ENCOUNTER — Ambulatory Visit: Payer: Medicare Other | Admitting: Hematology

## 2022-03-11 ENCOUNTER — Ambulatory Visit: Payer: Self-pay | Admitting: *Deleted

## 2022-03-11 ENCOUNTER — Encounter: Payer: Self-pay | Admitting: *Deleted

## 2022-03-12 ENCOUNTER — Ambulatory Visit: Payer: Self-pay | Admitting: *Deleted

## 2022-03-12 ENCOUNTER — Encounter: Payer: Self-pay | Admitting: *Deleted

## 2022-03-12 NOTE — Patient Outreach (Signed)
  Care Coordination   Follow Up Visit Note   03/11/2022 Name: [name] MRN: [id number] DOB: 18-Jan-1945  [name] is a 77 y.o. year old male who sees Steve Sites, MD for primary care. I  spoke with [name] and his daughter, [name], by telephone.   What matters to the patients health and wellness today?  Having cystoscopy with TURBT and beginning cancer treatment    SDOH assessments and interventions completed:  No    Care Coordination Interventions:  Yes, provided  Advised Patient/daughter to reach out to [name] (urology) office regarding procedure planned for 03/12/22. They are unaware of what time to come for procedure and any instructions.   Follow up plan:  follow-up with [facility] after cystoscopy    Encounter Outcome:  Pt. Visit Completed   [name], BSN, RN-BC [place]: [phone] Main #: [phone]  *late entry for telephone call on 03/11/22

## 2022-03-12 NOTE — Patient Outreach (Signed)
  Care Coordination   Follow Up Visit Note   03/12/2022 Name: [name] MRN: [id number] DOB: Oct 24, 1945  [name] is a 77 y.o. year old male who sees Sharilyn Sites, MD for primary care. I  spoke with patient's daughter, [name], by telephone today.   What matters to the patients health and wellness today?  Moving up appt with Dr Alyson Ingles so that he can have cystoscopy with TURBT and start cancer treatment    Goals Addressed             This Visit's Progress    Care Coordination Services r/t Bladder Cancer       Care Coordination Goals: Dr Johny Shears office and [facility] will work with Dr Noland Fordyce office to reschedule consult ASAP in light of inability to have cystoscopy with TURBT with Dr Dyanne Iha today due to not being informed of need to hold Plavix 3 days prior to procedure.  Schedule cystoscopy with TURBT with Dr Alyson Ingles ASAP so that chemo doesn't have to be pushed back further Work with [place]il[place] Ards and Dr Tammi Klippel to reschedule CT Simulation after Cystoscopy For now, patient will keep appointment with Dr Delton Coombes on 03/31/22, [place] for chemo education on 04/01/22 which chemo start date of 04/05/22 Patient/daughter will reach out to specialists with any new or worsening symptoms Patient/daughter will reach out to this [facility] at [phone] or reply via My Chart Message with any questions or concerns regarding scheduling or other care coordination needs        SDOH assessments and interventions completed:  No     Care Coordination Interventions:  Yes, provided  Interventions Today    Flowsheet Row Most Recent Value  Chronic Disease   Chronic disease during today's visit Other  [bladder cancer]  General Interventions   General Interventions Discussed/Reviewed Doctor Visits, Communication with  [CT simulation test had ot be cancelled and will be rescheduled after cystoscopy with TURBT]  Doctor Visits Discussed/Reviewed Specialist  [awaiting response via Staff Message  from Dr Alyson Ingles (urology) re: outreach by Reather Littler, PA-C requesting a consult and cystoscopy with TURBT ASAP if possible so that chemo doesn't have to be reshceduled.]  Communication with PCP/Specialists  Adonis Huguenin (oncology navigator) & Reather Littler, PA-C with Dr Tammi Klippel re: patient's inaiblity to have cystoscopy with TURBT today with Dr Dyanne Iha b/c it wasn't communicated to him that he needed to d/c plavix 3 days prior to procedure]  [place] Discussed/Reviewed Other  [therapuetic listening utilized regarding patient/daughter's frustration with lack of communication from Dr Ernestine Conrad office re: d/c plavix 3 dys prior to procedure and coordination of appts for bladder cancer treatment]       Follow up plan: Follow up call scheduled for 04/02/22    Encounter Outcome:  Pt. Visit Completed   [name], BSN, RN-BC RN Care Coordinator [place]: [phone] Main #: [phone]

## 2022-03-15 ENCOUNTER — Telehealth: Payer: Self-pay

## 2022-03-15 NOTE — Telephone Encounter (Signed)
Patient's daughter called advising patient has an appt with you 3/19. The patient is establishing care following diagnosis of Bladder Cancer. Per daughter they have put his cancer treatments on hold until he has a Cysto performed. They wanted to know if it was possible to have that done during his new patient visit.

## 2022-03-17 ENCOUNTER — Telehealth: Payer: Self-pay | Admitting: *Deleted

## 2022-03-17 NOTE — Telephone Encounter (Signed)
Returned patient's daughter phone call [name]), lvm for a return call

## 2022-03-17 NOTE — Patient Outreach (Signed)
  Care Coordination   Multidisciplinary Note   03/17/2022 Name: [name] MRN: [id number] DOB: 08-02-1945  [name] is a 77 y.o. year old male who sees Steve Sites, MD for primary care. I  collaborated with oncology staff and patient's daughter, [name].   What matters to the patients health and wellness today?  Completing cystoscopy with TURBT and beginning cancer treatment in a timely manner    SDOH assessments and interventions completed:  No    Care Coordination Interventions:  Yes, provided  Interventions Today    Flowsheet Row Most Recent Value  Chronic Disease   Chronic disease during today's visit Other  [Bladder Tumor]  General Interventions   General Interventions Discussed/Reviewed Communication with  Doctor Visits Discussed/Reviewed Specialist  [advised daughter to reach out to urology with any procedural questions. reviewed appt for cystoscopy with TURBT with Dr [name] on 3/13 and appt new pt appt with Dr [name] for 3/11. May be able to cancel Dr [name] if Dr [name] can accommodate cystoscopy]  Communication with PCP/Specialists  [Communicated with Steve Littler, PA with [place] regarding daughter, [name]'s, My Chart Message to me and Dr [name] re: cystoscopy with TURBT]  Education Interventions   Education Provided Provided Education  Provided Verbal Education On Other  [responded via My Chart but also left HIPAA compliant VM with information requested. Cystoscopy with TURBT is sometimes referred to as a "bladder scraping" and that is the same procedure that he has been scheduled for previously.]       Follow up plan: Follow up call scheduled for 04/02/22    Encounter Outcome:  Pt. Visit Completed   [name], BSN, RN-BC RN Care Coordinator [place]: [phone] Main #: [phone]

## 2022-03-18 DIAGNOSIS — D649 Anemia, unspecified: Secondary | ICD-10-CM | POA: Diagnosis not present

## 2022-03-22 ENCOUNTER — Ambulatory Visit: Payer: Medicare Other | Admitting: Urology

## 2022-03-22 ENCOUNTER — Encounter: Payer: Self-pay | Admitting: Urology

## 2022-03-22 VITALS — BP 150/74 | HR 66

## 2022-03-22 DIAGNOSIS — C679 Malignant neoplasm of bladder, unspecified: Secondary | ICD-10-CM | POA: Diagnosis not present

## 2022-03-22 NOTE — Patient Instructions (Signed)

## 2022-03-22 NOTE — Progress Notes (Unsigned)
03/22/2022 3:28 PM   [name] 25-Feb-1945 DN:[id number]  Referring provider: Sharilyn Sites, MD [address] [place],  [place] [address]  Muscle invasive bladder cancer   HPI: [name] is a 77yo here for evaluation of muscle invasive bladder cancer. He is currently being treated at [facility] and is scheduled for TURBT 03/24/2022. He wishes to proceed with chemotherapy and radiation. He is then schedule to start chemotherapy in March 21st and then radiation in 4-5 weeks.  Currently he has mild LUTS.    PMH: Past Medical History:  Diagnosis Date   Anginal pain ([place])    Arthritis    "all over"   Bladder tumor    Chronic lower back pain    Coronary artery disease    a. s/p CABG x 2 (LIMA->LAD, RIMA->RCA);  b. s/p multiple PCI's to Ramus;  c. 08/2011 Cath/PCI: LM 70% into ramus with 70-80% there->treated wtih 3.5x18 Xience Xpedition DES, LCX  nonobs, RCA occluded.  RIMA & LIMA patent, EF 55-65%   DJD (degenerative joint disease)    GERD (gastroesophageal reflux disease)    History of gout    Hyperlipidemia    Hypertension    Hypothyroidism    Neuropathy    OSA on CPAP    Prostate cancer [facility])     Surgical History: Past Surgical History:  Procedure Laterality Date   ABDOMINAL HERNIA REPAIR     BIOPSY  11/26/2015   Procedure: BIOPSY;  Surgeon: Steve Houston, MD;  [facility];  Service: Endoscopy;;  gastric esophagus   BIOPSY  01/20/2021   Procedure: BIOPSY;  Surgeon: Steve Quale, MD;  [facility];  Service: Gastroenterology;;   BIOPSY  10/28/2021   Procedure: BIOPSY;  Surgeon: Steve Quale, MD;  [facility];  Service: Gastroenterology;;   BLADDER TUMOR EXCISION     BROW LIFT Bilateral 04/27/2019   Procedure: BILATERAL BLEPHAROPLASTY;  Surgeon: Steve Goldmann, MD;  [facility]: [facility];  Service: Ophthalmology;  Laterality: Bilateral;   CARDIAC CATHETERIZATION N/A 04/10/2015   Procedure: Left Heart  Cath and Cors/Grafts Angiography;  Surgeon: Sherren Mocha, MD;  Location: [place] [facility];  Service: Cardiovascular;  Laterality: N/A;   CARDIAC CATHETERIZATION N/A 04/10/2015   Procedure: Coronary Stent Intervention;  Surgeon: Sherren Mocha, MD;  Location: [place] [facility];  Service: Cardiovascular;  Laterality: N/A;   CATARACT EXTRACTION W/PHACO Right 06/23/2015   Procedure: CATARACT EXTRACTION PHACO AND INTRAOCULAR LENS PLACEMENT RIGHT EYE; CDE:  7.08;  Surgeon: Steve Branch, MD;  [facility];  Service: Ophthalmology;  Laterality: Right;   CATARACT EXTRACTION W/PHACO Left 08/25/2015   Procedure: CATARACT EXTRACTION PHACO AND INTRAOCULAR LENS PLACEMENT LEFT EYE; CDE:  8.18;  Surgeon: Steve Branch, MD;  [facility]: [facility];  Service: Ophthalmology;  Laterality: Left;   COLONOSCOPY N/A 12/28/2012   Procedure: COLONOSCOPY;  Surgeon: Steve Houston, MD;  [facility];  Service: Endoscopy;  Laterality: N/A;  830 rescheduled   COLONOSCOPY WITH PROPOFOL N/A 01/20/2021   Procedure: COLONOSCOPY WITH PROPOFOL;  Surgeon: Steve Quale, MD;  [facility];  Service: Gastroenterology;  Laterality: N/A;  11:05   CORONARY ANGIOPLASTY WITH STENT PLACEMENT  2013; 04/10/2015   "this makes me a total of 7" (04/10/2015)   CORONARY ARTERY BYPASS GRAFT  1997   with (LIMA)   ESOPHAGEAL DILATION N/A 11/26/2015   Procedure: ESOPHAGEAL DILATION;  Surgeon: Steve Houston, MD;  [facility];  Service:  Endoscopy;  Laterality: N/A;   ESOPHAGEAL DILATION N/A 05/23/2017   Procedure: ESOPHAGEAL DILATION;  Surgeon: Steve Houston, MD;  [facility];  Service: Endoscopy;  Laterality: N/A;   ESOPHAGEAL DILATION N/A 06/16/2017   Procedure: ESOPHAGEAL DILATION;  Surgeon: Steve Houston, MD;  [facility];  Service: Endoscopy;  Laterality: N/A;   ESOPHAGEAL DILATION  10/28/2021   Procedure: ESOPHAGEAL DILATION;  Surgeon: Steve Quale, MD;  [facility];  Service: Gastroenterology;;   ESOPHAGOGASTRODUODENOSCOPY N/A 11/26/2015   Procedure: ESOPHAGOGASTRODUODENOSCOPY (EGD);  Surgeon: Steve Houston, MD;  [facility];  Service: Endoscopy;  Laterality: N/A;  1:25   ESOPHAGOGASTRODUODENOSCOPY N/A 05/23/2017   Procedure: ESOPHAGOGASTRODUODENOSCOPY (EGD);  Surgeon: Steve Houston, MD;  [facility];  Service: Endoscopy;  Laterality: N/A;  1:15   ESOPHAGOGASTRODUODENOSCOPY N/A 06/16/2017   Procedure: ESOPHAGOGASTRODUODENOSCOPY (EGD);  Surgeon: Steve Houston, MD;  [facility];  Service: Endoscopy;  Laterality: N/A;  1240   ESOPHAGOGASTRODUODENOSCOPY (EGD) WITH PROPOFOL N/A 01/20/2021   Procedure: ESOPHAGOGASTRODUODENOSCOPY (EGD) WITH PROPOFOL;  Surgeon: Steve Quale, MD;  [facility];  Service: Gastroenterology;  Laterality: N/A;   ESOPHAGOGASTRODUODENOSCOPY (EGD) WITH PROPOFOL N/A 10/28/2021   Procedure: ESOPHAGOGASTRODUODENOSCOPY (EGD) WITH PROPOFOL;  Surgeon: Steve Quale, MD;  [facility];  Service: Gastroenterology;  Laterality: N/A;  215 ASA 2, pt will arrive at 10:45   HERNIA REPAIR Left    IR IMAGING GUIDED PORT INSERTION  02/24/2022   KNEE CARTILAGE SURGERY Bilateral    LEFT HEART CATH AND CORS/GRAFTS ANGIOGRAPHY N/A 09/08/2016   Procedure: LEFT HEART CATH AND CORS/GRAFTS ANGIOGRAPHY;  Surgeon: Martinique, Peter M, MD;  Location: [place] [facility];  Service: Cardiovascular;  Laterality: N/A;   LEFT HEART CATHETERIZATION WITH CORONARY ANGIOGRAM N/A 09/08/2011   Procedure: LEFT HEART CATHETERIZATION WITH CORONARY ANGIOGRAM;  Surgeon: Sherren Mocha, MD;  Location: [facility] CATH LAB;  Service: Cardiovascular;  Laterality: N/A;   PERCUTANEOUS CORONARY STENT INTERVENTION (PCI-S) Right 09/08/2011   Procedure: PERCUTANEOUS CORONARY STENT INTERVENTION (PCI-S);  Surgeon: Sherren Mocha, MD;  Location: [facility] CATH LAB;  Service: Cardiovascular;  Laterality: Right;    POLYPECTOMY  01/20/2021   Procedure: POLYPECTOMY;  Surgeon: Steve Quale, MD;  [facility];  Service: Gastroenterology;;   ROBOT ASSISTED LAPAROSCOPIC RADICAL PROSTATECTOMY      Home Medications:  Allergies as of 03/22/2022       Reactions   Codeine Nausea Only   Pollen Extract Other (See Comments)   Seasonal        Medication List        Accurate as of March 22, 2022  3:28 PM. If you have any questions, ask your nurse or doctor.          amLODipine 10 MG tablet Commonly known as: NORVASC Take 10 mg by mouth in the morning.   aspirin EC 81 MG tablet Take 81 mg by mouth every evening.   atorvastatin 40 MG tablet Commonly known as: LIPITOR Take 0.5 tablets (20 mg total) by mouth every evening.   BLACK CURRANT SEED OIL PO Take 1 each by mouth 2 (two) times a week. 1 teaspoonful twice weekly   carvedilol 6.25 MG tablet Commonly known as: COREG Take 1 tablet (6.25 mg total) by mouth 2 (two) times daily.   celecoxib 200 MG capsule Commonly known as: CELEBREX Take 200 mg by mouth in the morning.   cetirizine 10 MG tablet Commonly  known as: ZYRTEC Take 10 mg by mouth every evening.   cholecalciferol 25 MCG (1000 UNIT) tablet Commonly known as: VITAMIN D3 Take 1,000 Units by mouth every evening.   clopidogrel 75 MG tablet Commonly known as: PLAVIX Take 1 tablet (75 mg total) by mouth in the morning.   Coenzyme Q10 300 MG Caps Take 300 mg by mouth every evening.   cyanocobalamin 1000 MCG/ML injection Commonly known as: VITAMIN B12 Inject 1,000 mcg into the muscle See admin instructions. Inject (1000 mcg) intramuscularly once monthly as needed for B-12 deficiency.   fenofibrate 160 MG tablet Take 1 tablet (160 mg total) by mouth daily. What changed: when to take this   FeroSul 325 (65 FE) MG tablet Generic drug: ferrous sulfate TAKE 1 TABLET(325 MG) BY MOUTH TWICE DAILY WITH A MEAL   Fish Oil 1000 MG Caps Take 1 capsule by mouth  in the morning.   gabapentin 600 MG tablet Commonly known as: NEURONTIN Take 600 mg by mouth 3 (three) times daily.   GLUCOSAMINE-CHONDROITIN PO Take 1 tablet by mouth every evening.   isosorbide mononitrate 60 MG 24 hr tablet Commonly known as: IMDUR Take 1.5 tablets (90 mg total) by mouth daily. What changed: when to take this   levothyroxine 150 MCG tablet Commonly known as: SYNTHROID Take 150 mcg by mouth daily before breakfast.   Lubricant Eye Drops 0.4-0.3 % Soln Generic drug: Polyethyl Glycol-Propyl Glycol Place 1-2 drops into both eyes 3 (three) times daily as needed (dry/irritated eyes.).   meclizine 25 MG tablet Commonly known as: ANTIVERT Take 1 tablet (25 mg total) by mouth 2 (two) times daily as needed for dizziness. What changed:  how much to take when to take this   nitroGLYCERIN 0.4 MG SL tablet Commonly known as: NITROSTAT Place 1 tablet (0.4 mg total) under the tongue every 5 (five) minutes as needed for chest pain.   psyllium 58.6 % powder Commonly known as: METAMUCIL Take 1 packet by mouth 2 (two) times a week.   traMADol 50 MG tablet Commonly known as: ULTRAM Take 50 mg by mouth 3 (three) times daily.        Allergies:  Allergies  Allergen Reactions   Codeine Nausea Only   Pollen Extract Other (See Comments)    Seasonal    Family History: Family History  Problem Relation Age of Onset   Cancer Father 89       died   Stroke Mother 44       died    Social History:  reports that he has quit smoking. His smoking use included cigars. He has been exposed to tobacco smoke. He has never used smokeless tobacco. He reports current alcohol use. He reports that he does not use drugs.  ROS: All other review of systems were reviewed and are negative except what is noted above in HPI  Physical Exam: BP (!) 150/74   Pulse 66   Constitutional:  Alert and oriented, No acute distress. HEENT: [place] AT, moist mucus membranes.  Trachea midline, no  masses. Cardiovascular: No clubbing, cyanosis, or edema. Respiratory: Normal respiratory effort, no increased work of breathing. GI: Abdomen is soft, nontender, nondistended, no abdominal masses GU: No CVA tenderness.  Lymph: No cervical or inguinal lymphadenopathy. Skin: No rashes, bruises or suspicious lesions. Neurologic: Grossly intact, no focal deficits, moving all 4 extremities. Psychiatric: Normal mood and affect.  Laboratory Data: Lab Results  Component Value Date   WBC 5.3 02/04/2022   HGB 10.4 (L)  02/04/2022   HCT 31.7 (L) 02/04/2022   MCV 101.9 (H) 02/04/2022   PLT 186 02/04/2022    Lab Results  Component Value Date   CREATININE 1.35 (H) 02/04/2022    No results found for: "PSA"  No results found for: "TESTOSTERONE"  No results found for: "HGBA1C"  Urinalysis    Component Value Date/Time   COLORURINE RED (A) 06/17/2020 0112   APPEARANCEUR TURBID (A) 06/17/2020 0112   LABSPEC  06/17/2020 0112    TEST NOT REPORTED DUE TO COLOR INTERFERENCE OF URINE PIGMENT   PHURINE  06/17/2020 0112    TEST NOT REPORTED DUE TO COLOR INTERFERENCE OF URINE PIGMENT   GLUCOSEU (A) 06/17/2020 0112    TEST NOT REPORTED DUE TO COLOR INTERFERENCE OF URINE PIGMENT   HGBUR (A) 06/17/2020 0112    TEST NOT REPORTED DUE TO COLOR INTERFERENCE OF URINE PIGMENT   BILIRUBINUR (A) 06/17/2020 0112    TEST NOT REPORTED DUE TO COLOR INTERFERENCE OF URINE PIGMENT   KETONESUR (A) 06/17/2020 0112    TEST NOT REPORTED DUE TO COLOR INTERFERENCE OF URINE PIGMENT   PROTEINUR (A) 06/17/2020 0112    TEST NOT REPORTED DUE TO COLOR INTERFERENCE OF URINE PIGMENT   NITRITE (A) 06/17/2020 0112    TEST NOT REPORTED DUE TO COLOR INTERFERENCE OF URINE PIGMENT   LEUKOCYTESUR (A) 06/17/2020 0112    TEST NOT REPORTED DUE TO COLOR INTERFERENCE OF URINE PIGMENT    Lab Results  Component Value Date   BACTERIA FEW (A) 06/17/2020    Pertinent Imaging: *** No results found for this or any previous  visit.  No results found for this or any previous visit.  No results found for this or any previous visit.  No results found for this or any previous visit.  No results found for this or any previous visit.  No valid procedures specified. No results found for this or any previous visit.  No results found for this or any previous visit.   Assessment & Plan:    1. Malignant neoplasm of urinary bladder, unspecified site (HCC) *** - Urinalysis, Routine w reflex microscopic   No follow-ups on file.  Nicolette Bang, MD  [facility] [place]

## 2022-03-23 LAB — URINALYSIS, ROUTINE W REFLEX MICROSCOPIC
Bilirubin, UA: NEGATIVE
Glucose, UA: NEGATIVE
Ketones, UA: NEGATIVE
Leukocytes,UA: NEGATIVE
Nitrite, UA: NEGATIVE
Specific Gravity, UA: 1.025 (ref 1.005–1.030)
Urobilinogen, Ur: 0.2 mg/dL (ref 0.2–1.0)
pH, UA: 5 (ref 5.0–7.5)

## 2022-03-23 LAB — MICROSCOPIC EXAMINATION
Bacteria, UA: NONE SEEN
RBC, Urine: >30 /hpf — AB (ref 0–2)

## 2022-03-24 ENCOUNTER — Ambulatory Visit: Payer: Medicare Other | Admitting: Urology

## 2022-03-24 DIAGNOSIS — Z79899 Other long term (current) drug therapy: Secondary | ICD-10-CM | POA: Diagnosis not present

## 2022-03-24 DIAGNOSIS — C679 Malignant neoplasm of bladder, unspecified: Secondary | ICD-10-CM | POA: Diagnosis not present

## 2022-03-24 DIAGNOSIS — N3080 Other cystitis without hematuria: Secondary | ICD-10-CM | POA: Diagnosis not present

## 2022-03-24 DIAGNOSIS — Z7982 Long term (current) use of aspirin: Secondary | ICD-10-CM | POA: Diagnosis not present

## 2022-03-24 DIAGNOSIS — Z7902 Long term (current) use of antithrombotics/antiplatelets: Secondary | ICD-10-CM | POA: Diagnosis not present

## 2022-03-24 DIAGNOSIS — Z8551 Personal history of malignant neoplasm of bladder: Secondary | ICD-10-CM | POA: Diagnosis not present

## 2022-03-24 DIAGNOSIS — N3289 Other specified disorders of bladder: Secondary | ICD-10-CM | POA: Diagnosis not present

## 2022-03-24 DIAGNOSIS — Z885 Allergy status to narcotic agent status: Secondary | ICD-10-CM | POA: Diagnosis not present

## 2022-03-24 DIAGNOSIS — D090 Carcinoma in situ of bladder: Secondary | ICD-10-CM | POA: Diagnosis not present

## 2022-03-24 DIAGNOSIS — Z91030 Bee allergy status: Secondary | ICD-10-CM | POA: Diagnosis not present

## 2022-03-26 ENCOUNTER — Ambulatory Visit: Payer: Medicare Other | Admitting: Radiation Oncology

## 2022-03-30 ENCOUNTER — Ambulatory Visit: Payer: Medicare Other | Admitting: Urology

## 2022-03-30 DIAGNOSIS — G4733 Obstructive sleep apnea (adult) (pediatric): Secondary | ICD-10-CM | POA: Diagnosis not present

## 2022-03-30 NOTE — Patient Instructions (Addendum)
[facility] Chemotherapy Teaching   You have been diagnosed with bladder cancer.  You will start chemotherapy and radiation therapy on the same day, once radiation is ready to begin. You will see the doctor regularly throughout treatment.  We will obtain blood work from you prior to every treatment and monitor your results to make sure it is safe to give your treatment. The doctor monitors your response to treatment by the way you are feeling, your blood work, and by obtaining scans periodically.  There will be wait times while you are here for treatment.  It will take about 30 minutes to 1 hour for your lab work to result.  Then there will be wait times while pharmacy mixes your medications.     You will have your treatment every 28 days.  On the first day you will receive Mitomycin and then placed on a chemotherapy pump with the medicine called Fluorouracil.   This medication will slowly be given to you by your pump for five days and then you will come in on the last day for the pump to be removed.  You will also be receiving radiation therapy daily.    Medications you will receive in the clinic prior to your chemotherapy medications:  Aloxi:  ALOXI is used in adults to help prevent nausea and vomiting that happens with certain chemotherapy drugs.  Aloxi is a long acting medication, and will remain in your system for about two days.   Dexamethasone:  This is a steroid given prior to chemotherapy to help prevent allergic reactions; it may also help prevent and control nausea and diarrhea.      Mitomycin   About This Drug   Mitomycin is used to treat cancer. It is given in the vein (IV).   Possible Side Effects   Bone marrow suppression. This is a decrease in the number of white blood cells, red blood cells, and platelets. This may raise your risk of infection, make you tired and weak, and raise your risk of bleeding.    Fever    Soreness of the mouth and throat. You may have  red areas, white patches, or sores that hurt.    Nausea and vomiting (throwing up)    Decreased appetite (decreased hunger)    Skin and tissue irritation including redness, pain, warmth, or swelling at the IV site if the drug leaks out of the vein and into nearby tissue. Very rarely it may cause local tissue necrosis (death).    Hair loss. Hair loss is often temporary, although with certain medicine, hair loss can sometimes be permanent. Hair loss may happen suddenly or gradually. If you lose hair, you may lose it from your head, face, armpits, pubic area, chest, and/or legs. You may also notice your hair getting thin. Note: Not all possible side effects are included above.   Warnings and Precautions   Severe bone marrow suppression, which can be life-threatening.    Changes in your kidney function    A syndrome that affects your red blood cells, platelets and blood vessels in your kidneys, which can cause kidney failure and be life-threatening.    Inflammation (swelling) of the lungs. You may have a dry cough or trouble breathing.   Note: Some of the side effects above are very rare. If you have concerns and/or questions, please discuss them with your medical team.   Important Information    This drug may be present in the saliva, tears, sweat, urine,  stool, vomit, semen, and vaginal secretions. Talk to your doctor and/or your nurse about the necessary precautions to take during this time.   Treating Side Effects   Manage tiredness by pacing your activities for the day.    Be sure to include periods of rest between energy-draining activities.    To decrease the risk of infection, wash your hands regularly.    Avoid close contact with people who have a cold, the flu, or other infections.    Take your temperature as your doctor or nurse tells you, and whenever you feel like you may have a fever.    To help decrease the risk of bleeding, use a soft toothbrush. Check with your nurse  before using dental floss.    Be very careful when using knives or tools.  Use an electric shaver instead of a razor.    Mouth care is very important. Your mouth care should consist of routine, gentle cleaning of your teeth or dentures and rinsing your mouth with a mixture of 1/2 teaspoon of salt in 8 ounces of water or 1/2 teaspoon of baking soda in 8 ounces of water. This should be done at least after each meal and at bedtime.    If you have mouth sores, avoid mouthwash that has alcohol. Also avoid alcohol and smoking because they can bother your mouth and throat.  Drink plenty of fluids (a minimum of eight glasses per day is recommended). More may be recommended by your doctor.    If you throw up, you should drink more fluids so that you do not become dehydrated (lack of water in the body from losing too much fluid).    To help with nausea and vomiting, eat small, frequent meals instead of three large meals a day. Choose foods and drinks that are at room temperature. Ask your nurse or doctor about other helpful tips and medicine that is available to help stop or lessen these symptoms.    To help with decreased appetite, eat small, frequent meals. Eat foods high in calories and protein, such as meat, poultry, fish, dry beans, tofu, eggs, nuts, milk, yogurt, cheese, ice cream, pudding, and nutritional supplements.    Consider using sauces and spices to increase taste. Daily exercise, with your doctor's approval, may increase your appetite.    To help with hair loss, wash with a mild shampoo and avoid washing your hair every day. Avoid coloring your hair.    Avoid rubbing your scalp, pat your hair or scalp dry.    Limit your use of hair spray, electric curlers, blow dryers, and curling irons.    If you are interested in getting a wig, talk to your nurse and they can help you get in touch with programs in your local area.   Food and Drug Interactions   There are no known interactions of  mitomycin with food.     This drug may interact with other medicines. Tell your doctor and pharmacist about all the prescription and over-the-counter medicines and dietary supplements (vitamins, minerals, herbs, and others) that you are taking at this time. Also, check with your doctor or pharmacist before starting any new prescription or over-the-counter medicines, or dietary supplements to make sure that there are no interactions.   When to Call the Doctor  Call your doctor or nurse if you have any of these symptoms and/or any new or unusual symptoms:    Fever of 100.4 F (38 C) or higher    Chills  Tiredness that interferes with your daily activities    Feeling dizzy or lightheaded    Easy bleeding or bruising    Wheezing and/or trouble breathing    Pain in your chest    Dry cough    Pain in your mouth or throat that makes it hard to eat or drink    Nausea that stops you from eating or drinking and/or is not relieved by prescribed medicines    Throwing up more than 3 times a day    Lasting loss of appetite or rapid weight loss of five pounds in a week    Swelling of the face, hands, feet, or any other part of the body    Decreased or very dark urine    While you are getting this drug, please tell your nurse right away if you have any pain, redness, or swelling at the site of the IV infusion.    If you think you may be pregnant   Reproduction Warnings    Pregnancy warning: It is not known if this drug may harm an unborn child. For this reason, be sure to talk with your doctor if you are pregnant or planning to become pregnant while receiving this drug. Let your doctor know right away if you think you may be pregnant.    Breastfeeding warning: It is not known if this drug passes into breast milk. For this reason, women should talk to their doctor about the risks and benefits of breastfeeding during treatment with this drug because this drug may enter the breast milk and  cause harm to a breastfeeding baby.    Fertility warning: Fertility studies have not been done with this drug. Talk with your doctor or nurse if you plan to have children. Ask for information on sperm or egg banking.    5-Fluorouracil (Adrucil; 5FU)  About This Drug  Fluorouracil is used to treat cancer. It is given in the vein (IV). It is given as an IV push from a syringe and also as a continuous infusion given via an ambulatory pump (a pump you take home and wear for a specified amount of time).  Possible Side Effects   Bone marrow suppression. This is a decrease in the number of white blood cells, red blood cells, and platelets. This may raise your risk of infection, make you tired and weak (fatigue), and raise your risk of bleeding   Changes in the tissue of the heart and/or heart attack. Some changes may happen that can cause your heart to have less ability to pump blood.   Blurred vision or other changes in eyesight   Nausea and throwing up (vomiting)   Diarrhea (loose bowel movements)   Ulcers - sores that may cause pain or bleeding in your digestive tract, which includes your mouth, esophagus, stomach, small/large intestines and rectum   Soreness of the mouth and throat. You may have red areas, white patches, or sores that hurt.   Allergic reactions, including anaphylaxis are rare but may happen in some patients. Signs of allergic reaction to this drug may be swelling of the face, feeling like your tongue or throat are swelling, trouble breathing, rash, itching, fever, chills, feeling dizzy, and/or feeling that your heart is beating in a fast or not normal way. If this happens, do not take another dose of this drug. You should get urgent medical treatment.   Sensitivity to light (photosensitivity). Photosensitivity means that you may become more sensitive to the sun and/or light.  You may get a skin rash/reaction if you are in the sun or are exposed to sun lamps and tanning  beds. Your eyes may water more, mostly in bright light.   Changes in your nail color, nail loss and/or brittle nail   Darkening of the skin, or changes to the color of your skin and/or veins used for infusion   Rash, dry skin, or itching  Note: Not all possible side effects are included above.  Warnings and Precautions   Hand-and-foot syndrome. The palms of your hands or soles of your feet may tingle, become numb, painful, swollen, or red.   Changes in your central nervous system can happen. The central nervous system is made up of your brain and spinal cord. You could feel extreme tiredness, agitation, confusion, hallucinations (see or hear things that are not there), trouble understanding or speaking, loss of control of your bowels or bladder, eyesight changes, numbness or lack of strength to your arms, legs, face, or body, or coma. If you start to have any of these symptoms let your doctor know right away.   Side effects of this drug may be unexpectedly severe in some patients  Note: Some of the side effects above are very rare. If you have concerns and/or questions, please discuss them with your medical team.   Important Information   This drug may be present in the saliva, tears, sweat, urine, stool, vomit, semen, and vaginal secretions. Talk to your doctor and/or your nurse about the necessary precautions to take during this time.   Treating Side Effects   Manage tiredness by pacing your activities for the day.   Be sure to include periods of rest between energy-draining activities.   To help decrease the risk of infections, wash your hands regularly.   Avoid close contact with people who have a cold, the flu, or other infections.   Take your temperature as your doctor or nurse tells you, and whenever you feel like you may have a fever.   Use a soft toothbrush. Check with your nurse before using dental floss.   Be very careful when using knives or tools.   Use an  electric shaver instead of a razor.   If you have a nose bleed, sit with your head tipped slightly forward. Apply pressure by lightly pinching the bridge of your nose between your thumb and forefinger. Call your doctor if you feel dizzy or faint or if the bleeding doesn't stop after 10 to 15 minutes.   Drink plenty of fluids (a minimum of eight glasses per day is recommended).   If you throw up or have loose bowel movements, you should drink more fluids so that you do not  become dehydrated (lack of water in the body from losing too much fluid).   To help with nausea and vomiting, eat small, frequent meals instead of three large meals a day. Choose foods and drinks that are at room temperature. Ask your nurse or doctor about other helpful tips and medicine that is available to help, stop, or lessen these symptoms.   If you have diarrhea, eat low-fiber foods that are high in protein and calories and avoid foods that can irritate your digestive tracts or lead to cramping.   Ask your nurse or doctor about medicine that can lessen or stop your diarrhea.   Mouth care is very important. Your mouth care should consist of routine, gentle cleaning of your teeth or dentures and rinsing your mouth with a mixture  of 1/2 teaspoon of salt in 8 ounces of water or 1/2 teaspoon of baking soda in 8 ounces of water. This should be done at least after each meal and at bedtime.   If you have mouth sores, avoid mouthwash that has alcohol. Also avoid alcohol and smoking because they can bother your mouth and throat.   Keeping your nails moisturized may help with brittleness.   To help with itching, moisturize your skin several times day.   Use sunscreen with SPF 30 or higher when you are outdoors even for a short time. Cover up when you are out in the sun. Wear wide-brimmed hats, long-sleeved shirts, and pants. Keep your neck, chest, and back covered. Wear dark sun glasses when in the sun or bright lights.   If  you get a rash do not put anything on it unless your doctor or nurse says you may. Keep the area around the rash clean and dry. Ask your doctor for medicine if your rash bothers you.   Keeping your pain under control is important to your well-being. Please tell your doctor or nurse if you are experiencing pain.   Food and Drug Interactions   There are no known interactions of fluorouracil with food.   Check with your doctor or pharmacist about all other prescription medicines and over-the-counter medicines and dietary supplements (vitamins, minerals, herbs and others) you are taking before starting this medicine as there are known drug interactions with 5-fluoroucacil. Also, check with your doctor or pharmacist before starting any new prescription or over-the-counter medicines, or dietary supplements to make sure that there are no interactions.  When to Call the Doctor  Call your doctor or nurse if you have any of these symptoms and/or any new or unusual symptoms:   Fever of 100.4 F (38 C) or higher   Chills   Easy bleeding or bruising   Nose bleed that doesn't stop bleeding after 10-15 minutes   Trouble breathing   Feeling dizzy or lightheaded   Feeling that your heart is beating in a fast or not normal way (palpitations)   Chest pain or symptoms of a heart attack. Most heart attacks involve pain in the center of the chest that lasts more than a few minutes. The pain may go away and come back or it can be constant. It can feel like pressure, squeezing, fullness, or pain. Sometimes pain is felt in one or both arms, the back, neck, jaw, or stomach. If any of these symptoms last 2 minutes, call 911.   Confusion and/or agitation   Hallucinations   Trouble understanding or speaking   Loss of control of bowels or bladder   Blurry vision or changes in your eyesight   Headache that does not go away   Numbness or lack of strength to your arms, legs, face, or body   Nausea that  stops you from eating or drinking and/or is not relieved by prescribed medicines   Throwing up more than 3 times a day   Diarrhea, 4 times in one day or diarrhea with lack of strength or a feeling of being dizzy   Pain in your mouth or throat that makes it hard to eat or drink   Pain along the digestive tract - especially if worse after eating   Blood in your vomit (bright red or coffee-ground) and/or stools (bright red, or black/tarry)   Coughing up blood   Tiredness that interferes with your daily activities   Painful, red, or  swollen areas on your hands or feet or around your nails   A new rash or a rash that is not relieved by prescribed medicines   Develop sensitivity to sunlight/light   Numbness and/or tingling of your hands and/or feet   Signs of allergic reaction: swelling of the face, feeling like your tongue or throat are swelling, trouble breathing, rash, itching, fever, chills, feeling dizzy, and/or feeling that your heart is beating in a fast or not normal way. If this happens, call 911 for emergency care.   If you think you are pregnant or may have impregnated your partner  Reproduction Warnings   Pregnancy warning: This drug may have harmful effects on the unborn baby. Women of child bearing potential should use effective methods of birth control during your cancer treatment and 3 months after treatment. Men with male partners of childbearing potential should use effective methods of birth control during your cancer treatment and for 3 months after your cancer treatment. Let your doctor know right away if you think you may be pregnant or may have impregnated your partner.   Breastfeeding warning: It is not known if this drug passes into breast milk. For this reason, Women should not breastfeed during treatment because this drug could enter the breast milk and cause harm to a breastfeeding baby.   Fertility warning: In men and women both, this drug may affect your  ability to have children in the future. Talk with your doctor or nurse if you plan to have children. Ask for information on sperm or egg banking.   SELF CARE ACTIVITIES WHILE ON CHEMOTHERAPY/IMMUNOTHERAPY:  Hydration Increase your fluid intake 48 hours prior to treatment and drink at least 8 to 12 cups (64 ounces) of water/decaffeinated beverages per day after treatment. You can still have your cup of coffee or soda but these beverages do not count as part of your 8 to 12 cups that you need to drink daily. No alcohol intake.  Medications Continue taking your normal prescription medication as prescribed.  If you start any new herbal or new supplements please let us know first to make sure it is safe.  Mouth Care Have teeth cleaned professionally before starting treatment. Keep dentures and partial plates clean. Use soft toothbrush and do not use mouthwashes that contain alcohol. Biotene is a good mouthwash that is available at most pharmacies or may be ordered by calling [phone]. Use warm salt water gargles (1 teaspoon salt per 1 quart warm water) before and after meals and at bedtime. Or you may rinse with 2 tablespoons of three-percent hydrogen peroxide mixed in eight ounces of water. If you are still having problems with your mouth or sores in your mouth please call the clinic. If you need dental work, please let the doctor know before you go for your appointment so that we can coordinate the best possible time for you in regards to your chemo regimen. You need to also let your dentist know that you are actively taking chemo. We may need to do labs prior to your dental appointment.  Skin Care Always use sunscreen that has not expired and with SPF (Sun Protection Factor) of 50 or higher. Wear hats to protect your head from the sun. Remember to use sunscreen on your hands, ears, face, & feet.  Use good moisturizing lotions such as udder cream, eucerin, or even Vaseline. Some chemotherapies can  cause dry skin, color changes in your skin and nails.    Avoid long, hot  showers or baths. Use gentle, fragrance-free soaps and laundry detergent. Use moisturizers, preferably creams or ointments rather than lotions because the thicker consistency is better at preventing skin dehydration. Apply the cream or ointment within 15 minutes of showering. Reapply moisturizer at night, and moisturize your hands every time after you wash them.   Infection Prevention Please wash your hands for at least 30 seconds using warm soapy water. Handwashing is the #1 way to prevent the spread of germs. Stay away from sick people or people who are getting over a cold. If you develop respiratory systems such as green/yellow mucus production or productive cough or persistent cough let us know and we will see if you need an antibiotic. It is a good idea to keep a pair of gloves on when going into grocery stores/[employer] to decrease your risk of coming into contact with germs on the carts, etc. Carry alcohol hand gel with you at all times and use it frequently if out in public. If your temperature reaches 100.5 or higher please call the clinic and let us know.  If it is after hours or on the weekend please go to the ER if your temperature is over 100.4.  Please have your own personal thermometer at home to use.    Sex and bodily fluids If you are going to have sex, a condom must be used to protect the person that isn't taking immunotherapy. For a few days after treatment, immunotherapy can be excreted through your bodily fluids.  When using the toilet please close the lid and flush the toilet twice.  Do this for a few day after you have had immunotherapy.   Contraception It is not known for sure whether or not immunotherapy drugs can be passed on through semen or secretions from the vagina. Because of this some doctors advise people to use a barrier method if you have sex during treatment. This applies to vaginal, anal or oral  sex.  Generally, doctors advise a barrier method only for the time you are actually having the treatment and for about a week after your treatment.  Advice like this can be worrying, but this does not mean that you have to avoid being intimate with your partner. You can still have close contact with your partner and continue to enjoy sex.  Animals If you have cats or birds we just ask that you not change the litter or change the cage.  Please have someone else do this for you while you are on immunotherapy.   Food Safety During and After Cancer Treatment Food safety is important for people both during and after cancer treatment. Cancer and cancer treatments, such as chemotherapy, radiation therapy, and stem cell/bone marrow transplantation, often weaken the immune system. This makes it harder for your body to protect itself from foodborne illness, also called food poisoning. Foodborne illness is caused by eating food that contains harmful bacteria, parasites, or viruses.  Foods to avoid Some foods have a higher risk of becoming tainted with bacteria. These include: Unwashed fresh fruit and vegetables, especially leafy vegetables that can hide dirt and other contaminants Raw sprouts, such as alfalfa sprouts Raw or undercooked beef, especially ground beef, or other raw or undercooked meat and poultry Fatty, fried, or spicy foods immediately before or after treatment.  These can sit heavy on your stomach and make you feel nauseous. Raw or undercooked shellfish, such as oysters. Sushi and sashimi, which often contain raw fish.  Unpasteurized beverages, such as unpasteurized  fruit juices, raw milk, raw yogurt, or cider Undercooked eggs, such as soft boiled, over easy, and poached; raw, unpasteurized eggs; or foods made with raw egg, such as homemade raw cookie dough and homemade mayonnaise  Simple steps for food safety  Shop smart. Do not buy food stored or displayed in an unclean area. Do not  buy bruised or damaged fruits or vegetables. Do not buy cans that have cracks, dents, or bulges. Pick up foods that can spoil at the end of your shopping trip and store them in a cooler on the way home.  Prepare and clean up foods carefully. Rinse all fresh fruits and vegetables under running water, and dry them with a clean towel or paper towel. Clean the top of cans before opening them. After preparing food, wash your hands for 20 seconds with hot water and soap. Pay special attention to areas between fingers and under nails. Clean your utensils and dishes with hot water and soap. Disinfect your kitchen and cutting boards using 1 teaspoon of liquid, unscented bleach mixed into 1 quart of water.    Dispose of old food. Eat canned and packaged food before its expiration date (the "use by" or "best before" date). Consume refrigerated leftovers within 3 to 4 days. After that time, throw out the food. Even if the food does not smell or look spoiled, it still may be unsafe. Some bacteria, such as Listeria, can grow even on foods stored in the refrigerator if they are kept for too long.  Take precautions when eating out. At restaurants, avoid buffets and salad bars where food sits out for a long time and comes in contact with many people. Food can become contaminated when someone with a virus, often a norovirus, or another "bug" handles it. Put any leftover food in a "to-go" container yourself, rather than having the server do it. And, refrigerate leftovers as soon as you get home. Choose restaurants that are clean and that are willing to prepare your food as you order it cooked.    SYMPTOMS TO REPORT AS SOON AS POSSIBLE AFTER TREATMENT:  FEVER GREATER THAN 100.4 F CHILLS WITH OR WITHOUT FEVER NAUSEA AND VOMITING THAT IS NOT CONTROLLED WITH YOUR NAUSEA MEDICATION UNUSUAL SHORTNESS OF BREATH UNUSUAL BRUISING OR BLEEDING TENDERNESS IN MOUTH AND THROAT WITH OR WITHOUT PRESENCE OF ULCERS URINARY  PROBLEMS BOWEL PROBLEMS UNUSUAL RASH     Wear comfortable clothing and clothing appropriate for easy access to any Portacath or PICC line. Let us know if there is anything that we can do to make your therapy better!   What to do if you need assistance after hours or on the weekends: CALL [phone].  HOLD on the line, do not hang up.  You will hear multiple messages but at the end you will be connected with a nurse triage line.  They will contact the doctor if necessary.  Most of the time they will be able to assist you.  Do not call the hospital operator.    I have been informed and understand all of the instructions given to me and have received a copy. I have been instructed to call the clinic [phone] or my family physician as soon as possible for continued medical care, if indicated. I do not have any more questions at this time but understand that I may call the [place] or the Patient Navigator at [phone] during office hours should I have questions or need assistance in obtaining follow-up care.

## 2022-03-30 NOTE — Progress Notes (Signed)
[facility] [address] [place] [address]    Clinic Day:  03/31/2022  Referring physician: Sharilyn Sites, MD  Patient Care Team: Sharilyn Sites, MD as PCP - General (Family Medicine) Sherren Mocha, MD as PCP - Cardiology (Cardiology) Ilean China, RN as [place] Management Derek Jack, MD as Medical Oncologist (Medical Oncology) Brien Mates, RN as Oncology Nurse Navigator (Medical Oncology)   ASSESSMENT & PLAN:   Assessment: 1.  T2 N0 bladder cancer (multifocal): - 12/16/2014: Initial diagnosis, malignant neoplasm of lateral wall of bladder - 03/14/2018: TURBT (pathology-invasive papillary urothelial carcinoma, high-grade, tumor involves lamina propria, muscularis propria present, not involved by tumor) - 05/04/2018 - 06/13/2018: Initial BCG induction course - 08/22/2018: Bladder biopsy HG Ta, multifocal - 08/29/2018 - 10/01/2018: Intravesicular valrubicin - 02/20/2019: Bladder biopsy-benign - 09/15/2019: TURBT- HG Ta, muscle present - 10/24/2019 - 11/28/2019: Completion of BCG induction course x 6 - 05/14/2020: TURBT-HG Ta, muscle present - 07/02/2020 - 08/20/2020: Intravesical gemcitabine/docetaxel - 03/10/2021: TURBT-HG Ta + CIS, muscle present - 04/29/2021 - 06/03/2021: Reinduction intravesical gemcitabine/docetaxel - 07/06/2021: Cystoscopy without any visible tumors, cytology with high-grade urothelial carcinoma - 12/30/2021: Progression with lesions throughout the bladder, muscle invasive at left lateral wall - He has chronic neuropathy, CKD which precludes him from cisplatin-based neoadjuvant chemotherapy followed by cystectomy. - He met with radiation oncology at [facility].  Due to multifocal nature of bladder cancer, they recommended him to go for surgery.  He preferred trimodality therapy. - Cystoscopy and TURBT (03/24/2022): At [facility] with specimens taken from right posterior wall bladder tumor, left anterior dome bladder tumor  and left lateral wall scar.  Pathology shows urothelial carcinoma in situ with no evidence of invasion in all 3 specimens.  Muscularis propria present, uninvolved by malignancy.   2.  Social/family history: - He lives at home by himself.  He is seen today with his daughter.  He is independent of ADLs and IADLs.  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  He dances 1-2 times per week.  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  Quit smoking 50 years ago. - Father had prostate cancer.  Maternal grandmother had cancer, type unknown to the patient.  Plan: 1.  T2 N0 multifocal bladder cancer: - CT scan and bone scan did not show any evidence of metastatic disease. - He underwent cystoscopy and TURBT on 03/24/2022. - We reviewed pathology reports in detail.  I have reviewed records from [facility]. - We discussed chemotherapy regimen with 5-FU and mitomycin as he is not a candidate for cisplatin. - We discussed the schedule of chemotherapy and side effects in detail.  He already has port placed. - We will start chemoradiation therapy in 2 weeks.   2.  Normocytic to microcytic anemia: - He received Feraheme on 02/16/2022 and 02/23/2022. - I reviewed labs from 03/18/2022 at [facility].  CBC showed hemoglobin improved to 11.6. - He is taking iron tablet twice daily.  He is having constipation.  I have told him to decrease to once daily.  Orders Placed This Encounter  Procedures   CBC with Differential    Standing Status:   Future    Standing Expiration Date:   04/14/2023   Comprehensive metabolic panel    Standing Status:   Future    Standing Expiration Date:   04/14/2023   CBC with Differential    Standing Status:   Future  Standing Expiration Date:   05/04/2023   Comprehensive metabolic panel    Standing Status:   Future    Standing Expiration Date:   05/04/2023   Magnesium    Standing Status:   Future    Standing Expiration Date:   04/14/2023    Magnesium    Standing Status:   Future    Standing Expiration Date:   05/04/2023      I,Katie Daubenspeck,acting as a scribe for Derek Jack, MD.,have documented all relevant documentation on the behalf of Derek Jack, MD,as directed by  Derek Jack, MD while in the presence of Derek Jack, MD.   I, Derek Jack MD, have reviewed the above documentation for accuracy and completeness, and I agree with the above.   Derek Jack, MD   3/20/20244:44 PM  CHIEF COMPLAINT:   Diagnosis: T2 N0 bladder cancer    Cancer Staging  Bladder cancer [facility]) Staging form: Urinary Bladder, AJCC 8th Edition - Clinical stage from 01/29/2022: Stage II (cT2, cN0, cM0) - Unsigned    Prior Therapy: None  Current Therapy:  Trimodality therapy    HISTORY OF PRESENT ILLNESS:   This patient had bladder cancer which was initially diagnosed on 12/16/2014 in the lateral wall. He underwent TURBT in May 2020 followed by BCG induction. Later he received intravesicular valrubicin in August 2020. He will again had BCG therapy in October 2021. In June 2022 through August 2022 he received intravesical gemcitabine/docetaxel. Cystoscopy on 12/30/2021 showed lesions throughout the bladder, muscle invasive at left lateral wall.  INTERVAL HISTORY:   [name] is a 77 y.o. male presenting to clinic today for follow up of bladder cancer. He was last seen by me on 02/15/22.  Since his last visit, he received two doses of IV iron with Feraheme, on 02/16/22 and 02/23/22. He underwent port placement on 02/24/22.  He also underwent cystoscopy and TURBT on 03/24/22 under Dr. Dyanne Iha. Pathology from the procedure showed only in situ urothelial carcinoma.  Today, he states that he is doing well overall. His appetite level is at 100%. His energy level is at 100%. He denies any hematuria at this time.  No new onset pains.   PAST MEDICAL HISTORY:   Past Medical History: Past Medical History:   Diagnosis Date   Anginal pain ([place])    Arthritis    "all over"   Bladder tumor    Chronic lower back pain    Coronary artery disease    a. s/p CABG x 2 (LIMA->LAD, RIMA->RCA);  b. s/p multiple PCI's to Ramus;  c. 08/2011 Cath/PCI: LM 70% into ramus with 70-80% there->treated wtih 3.5x18 Xience Xpedition DES, LCX  nonobs, RCA occluded.  RIMA & LIMA patent, EF 55-65%   DJD (degenerative joint disease)    GERD (gastroesophageal reflux disease)    History of gout    Hyperlipidemia    Hypertension    Hypothyroidism    Neuropathy    OSA on CPAP    Prostate cancer [facility])     Surgical History: Past Surgical History:  Procedure Laterality Date   ABDOMINAL HERNIA REPAIR     BIOPSY  11/26/2015   Procedure: BIOPSY;  Surgeon: Rogene Houston, MD;  [facility];  Service: Endoscopy;;  gastric esophagus   BIOPSY  01/20/2021   Procedure: BIOPSY;  Surgeon: Harvel Quale, MD;  [facility];  Service: Gastroenterology;;   BIOPSY  10/28/2021   Procedure: BIOPSY;  Surgeon: Harvel Quale, MD;  [facility];  Service: Gastroenterology;;   BLADDER TUMOR EXCISION     BROW LIFT Bilateral 04/27/2019   Procedure: BILATERAL BLEPHAROPLASTY;  Surgeon: Baruch Goldmann, MD;  Location: [facility];  Service: Ophthalmology;  Laterality: Bilateral;   CARDIAC CATHETERIZATION N/A 04/10/2015   Procedure: Left Heart Cath and Cors/Grafts Angiography;  Surgeon: Sherren Mocha, MD;  Location: [place] CV LAB;  Service: Cardiovascular;  Laterality: N/A;   CARDIAC CATHETERIZATION N/A 04/10/2015   Procedure: Coronary Stent Intervention;  Surgeon: Sherren Mocha, MD;  Location: [place] CV LAB;  Service: Cardiovascular;  Laterality: N/A;   CATARACT EXTRACTION W/PHACO Right 06/23/2015   Procedure: CATARACT EXTRACTION PHACO AND INTRAOCULAR LENS PLACEMENT RIGHT EYE; CDE:  7.08;  Surgeon: Tonny Branch, MD;  [facility]: [facility];  Service: Ophthalmology;  Laterality: Right;    CATARACT EXTRACTION W/PHACO Left 08/25/2015   Procedure: CATARACT EXTRACTION PHACO AND INTRAOCULAR LENS PLACEMENT LEFT EYE; CDE:  8.18;  Surgeon: Tonny Branch, MD;  [facility];  Service: Ophthalmology;  Laterality: Left;   COLONOSCOPY N/A 12/28/2012   Procedure: COLONOSCOPY;  Surgeon: Rogene Houston, MD;  [facility];  Service: Endoscopy;  Laterality: N/A;  830 rescheduled   COLONOSCOPY WITH PROPOFOL N/A 01/20/2021   Procedure: COLONOSCOPY WITH PROPOFOL;  Surgeon: Harvel Quale, MD;  [facility];  Service: Gastroenterology;  Laterality: N/A;  11:05   CORONARY ANGIOPLASTY WITH STENT PLACEMENT  2013; 04/10/2015   "this makes me a total of 7" (04/10/2015)   CORONARY ARTERY BYPASS GRAFT  1997   with (LIMA)   ESOPHAGEAL DILATION N/A 11/26/2015   Procedure: ESOPHAGEAL DILATION;  Surgeon: Rogene Houston, MD;  [facility];  Service: Endoscopy;  Laterality: N/A;   ESOPHAGEAL DILATION N/A 05/23/2017   Procedure: ESOPHAGEAL DILATION;  Surgeon: Rogene Houston, MD;  [facility];  Service: Endoscopy;  Laterality: N/A;   ESOPHAGEAL DILATION N/A 06/16/2017   Procedure: ESOPHAGEAL DILATION;  Surgeon: Rogene Houston, MD;  [facility];  Service: Endoscopy;  Laterality: N/A;   ESOPHAGEAL DILATION  10/28/2021   Procedure: ESOPHAGEAL DILATION;  Surgeon: Harvel Quale, MD;  [facility];  Service: Gastroenterology;;   ESOPHAGOGASTRODUODENOSCOPY N/A 11/26/2015   Procedure: ESOPHAGOGASTRODUODENOSCOPY (EGD);  Surgeon: Rogene Houston, MD;  [facility];  Service: Endoscopy;  Laterality: N/A;  1:25   ESOPHAGOGASTRODUODENOSCOPY N/A 05/23/2017   Procedure: ESOPHAGOGASTRODUODENOSCOPY (EGD);  Surgeon: Rogene Houston, MD;  [facility];  Service: Endoscopy;  Laterality: N/A;  1:15   ESOPHAGOGASTRODUODENOSCOPY N/A 06/16/2017   Procedure: ESOPHAGOGASTRODUODENOSCOPY (EGD);  Surgeon: Rogene Houston, MD;   [facility];  Service: Endoscopy;  Laterality: N/A;  1240   ESOPHAGOGASTRODUODENOSCOPY (EGD) WITH PROPOFOL N/A 01/20/2021   Procedure: ESOPHAGOGASTRODUODENOSCOPY (EGD) WITH PROPOFOL;  Surgeon: Harvel Quale, MD;  [facility];  Service: Gastroenterology;  Laterality: N/A;   ESOPHAGOGASTRODUODENOSCOPY (EGD) WITH PROPOFOL N/A 10/28/2021   Procedure: ESOPHAGOGASTRODUODENOSCOPY (EGD) WITH PROPOFOL;  Surgeon: Harvel Quale, MD;  [facility];  Service: Gastroenterology;  Laterality: N/A;  215 ASA 2, pt will arrive at 10:45   HERNIA REPAIR Left    IR IMAGING GUIDED PORT INSERTION  02/24/2022   KNEE CARTILAGE SURGERY Bilateral    LEFT HEART CATH AND CORS/GRAFTS ANGIOGRAPHY N/A 09/08/2016   Procedure: LEFT HEART CATH AND CORS/GRAFTS ANGIOGRAPHY;  Surgeon: Martinique, Peter M, MD;  Location: [place] CV LAB;  Service: Cardiovascular;  Laterality: N/A;   LEFT HEART CATHETERIZATION  WITH CORONARY ANGIOGRAM N/A 09/08/2011   Procedure: LEFT HEART CATHETERIZATION WITH CORONARY ANGIOGRAM;  Surgeon: Sherren Mocha, MD;  Location: [facility] CATH LAB;  Service: Cardiovascular;  Laterality: N/A;   PERCUTANEOUS CORONARY STENT INTERVENTION (PCI-S) Right 09/08/2011   Procedure: PERCUTANEOUS CORONARY STENT INTERVENTION (PCI-S);  Surgeon: Sherren Mocha, MD;  Location: [facility] CATH LAB;  Service: Cardiovascular;  Laterality: Right;   POLYPECTOMY  01/20/2021   Procedure: POLYPECTOMY;  Surgeon: Harvel Quale, MD;  [facility];  Service: Gastroenterology;;   ROBOT ASSISTED LAPAROSCOPIC RADICAL PROSTATECTOMY      Social History: Social History   Socioeconomic History   Marital status: Widowed    Spouse name: Not on file   Number of children: Not on file   Years of education: Not on file   Highest education level: Not on file  Occupational History   Occupation: semi-retired  Tobacco Use   Smoking status: Former    Types: Cigars    Passive exposure:  Past   Smokeless tobacco: Never   Tobacco comments:    "quit smoking in ~ 1978"  Vaping Use   Vaping Use: Never used  Substance and Sexual Activity   Alcohol use: Yes    Comment: occasionally    Drug use: No   Sexual activity: Not Currently  Other Topics Concern   Not on file  Social History Narrative   Not on file   Social Determinants of Health   Financial Resource Strain: Low Risk  (02/18/2022)   Overall Financial Resource Strain (CARDIA)    Difficulty of Paying Living Expenses: Not hard at all  Food Insecurity: No Food Insecurity (02/04/2022)   Hunger Vital Sign    Worried About Running Out of Food in the Last Year: Never true    [place] in the Last Year: Never true  Transportation Needs: No Transportation Needs (02/18/2022)   PRAPARE - Hydrologist (Medical): No    Lack of Transportation (Non-Medical): No  Physical Activity: Not on file  Stress: Not on file  Social Connections: Not on file  Intimate Partner Violence: Not At Risk (02/04/2022)   Humiliation, Afraid, Rape, and Kick questionnaire    Fear of Current or Ex-Partner: No    Emotionally Abused: No    Physically Abused: No    Sexually Abused: No    Family History: Family History  Problem Relation Age of Onset   Cancer Father 72       died   Stroke Mother 64       died    Current Medications:  Current Outpatient Medications:    amLODipine (NORVASC) 10 MG tablet, Take 10 mg by mouth in the morning., Disp: , Rfl:    aspirin EC 81 MG tablet, Take 81 mg by mouth every evening., Disp: 30 tablet, Rfl:    atorvastatin (LIPITOR) 40 MG tablet, Take 0.5 tablets (20 mg total) by mouth every evening., Disp: 45 tablet, Rfl: 3   BLACK CURRANT SEED OIL PO, Take 1 each by mouth 2 (two) times a week. 1 teaspoonful twice weekly, Disp: , Rfl:    carvedilol (COREG) 6.25 MG tablet, Take 1 tablet (6.25 mg total) by mouth 2 (two) times daily., Disp: 180 tablet, Rfl: 3   cetirizine (ZYRTEC) 10  MG tablet, Take 10 mg by mouth every evening., Disp: , Rfl:    cholecalciferol (VITAMIN D3) 25 MCG (1000 UNIT) tablet, Take 1,000 Units by mouth every evening., Disp: , Rfl:  clopidogrel (PLAVIX) 75 MG tablet, Take 1 tablet (75 mg total) by mouth in the morning., Disp: 90 tablet, Rfl: 3   Coenzyme Q10 300 MG CAPS, Take 300 mg by mouth every evening., Disp: , Rfl:    cyanocobalamin (,VITAMIN B-12,) 1000 MCG/ML injection, Inject 1,000 mcg into the muscle See admin instructions. Inject (1000 mcg) intramuscularly once monthly as needed for B-12 deficiency., Disp: , Rfl: 2   fenofibrate 160 MG tablet, Take 1 tablet (160 mg total) by mouth daily. (Patient taking differently: Take 160 mg by mouth every evening.), Disp: 30 tablet, Rfl: 11   FEROSUL 325 (65 Fe) MG tablet, TAKE 1 TABLET(325 MG) BY MOUTH TWICE DAILY WITH A MEAL, Disp: 180 tablet, Rfl: 3   gabapentin (NEURONTIN) 600 MG tablet, Take 600 mg by mouth 3 (three) times daily., Disp: , Rfl: 3   GLUCOSAMINE-CHONDROITIN PO, Take 1 tablet by mouth every evening., Disp: , Rfl:    isosorbide mononitrate (IMDUR) 60 MG 24 hr tablet, Take 1.5 tablets (90 mg total) by mouth daily. (Patient taking differently: Take 90 mg by mouth every evening.), Disp: 135 tablet, Rfl: 3   levothyroxine (SYNTHROID, LEVOTHROID) 150 MCG tablet, Take 150 mcg by mouth daily before breakfast., Disp: , Rfl:    meclizine (ANTIVERT) 25 MG tablet, Take 1 tablet (25 mg total) by mouth 2 (two) times daily as needed for dizziness. (Patient taking differently: Take 12.5 mg by mouth at bedtime as needed for dizziness.), Disp: 30 tablet, Rfl: 0   nitroGLYCERIN (NITROSTAT) 0.4 MG SL tablet, Place 1 tablet (0.4 mg total) under the tongue every 5 (five) minutes as needed for chest pain., Disp: 25 tablet, Rfl: 11   Omega-3 Fatty Acids (FISH OIL) 1000 MG CAPS, Take 1 capsule by mouth in the morning., Disp: , Rfl:    Polyethyl Glycol-Propyl Glycol (LUBRICANT EYE DROPS) 0.4-0.3 % SOLN, Place 1-2  drops into both eyes 3 (three) times daily as needed (dry/irritated eyes.)., Disp: , Rfl:    psyllium (METAMUCIL) 58.6 % powder, Take 1 packet by mouth 2 (two) times a week., Disp: , Rfl:    traMADol (ULTRAM) 50 MG tablet, Take 50 mg by mouth 3 (three) times daily., Disp: , Rfl: 1   Allergies: Allergies  Allergen Reactions   Codeine Nausea Only   Pollen Extract Other (See Comments)    Seasonal    REVIEW OF SYSTEMS:   Review of Systems  Constitutional:  Negative for chills, fatigue and fever.  HENT:   Negative for lump/mass, mouth sores, nosebleeds, sore throat and trouble swallowing.   Eyes:  Negative for eye problems.  Respiratory:  Negative for cough and shortness of breath.   Cardiovascular:  Negative for chest pain, leg swelling and palpitations.  Gastrointestinal:  Positive for constipation. Negative for abdominal pain, diarrhea, nausea and vomiting.  Genitourinary:  Positive for frequency. Negative for bladder incontinence, difficulty urinating, dysuria, hematuria and nocturia.   Musculoskeletal:  Negative for arthralgias, back pain, flank pain, myalgias and neck pain.  Skin:  Negative for itching and rash.  Neurological:  Positive for numbness. Negative for dizziness and headaches.  Hematological:  Does not bruise/bleed easily.  Psychiatric/Behavioral:  Negative for depression, sleep disturbance and suicidal ideas. The patient is not nervous/anxious.   All other systems reviewed and are negative.    VITALS:   Blood pressure (!) 142/67, pulse 70, temperature 98 F (36.7 C), temperature source Oral, resp. rate 18, weight 221 lb 6.4 oz (100.4 kg), SpO2 98 %.  Wt Readings  from Last 3 Encounters:  03/31/22 221 lb 6.4 oz (100.4 kg)  02/24/22 216 lb (98 kg)  02/15/22 217 lb 1.6 oz (98.5 kg)    Body mass index is 29.21 kg/m.  Performance status (ECOG): 1 - Symptomatic but completely ambulatory  PHYSICAL EXAM:   Physical Exam Vitals and nursing note reviewed. Exam  conducted with a chaperone present.  Constitutional:      Appearance: Normal appearance.  Cardiovascular:     Rate and Rhythm: Normal rate and regular rhythm.     Pulses: Normal pulses.     Heart sounds: Normal heart sounds.  Pulmonary:     Effort: Pulmonary effort is normal.     Breath sounds: Normal breath sounds.  Abdominal:     Palpations: Abdomen is soft. There is no hepatomegaly, splenomegaly or mass.     Tenderness: There is no abdominal tenderness.  Musculoskeletal:     Right lower leg: No edema.     Left lower leg: No edema.  Lymphadenopathy:     Cervical: No cervical adenopathy.     Right cervical: No superficial, deep or posterior cervical adenopathy.    Left cervical: No superficial, deep or posterior cervical adenopathy.     Upper Body:     Right upper body: No supraclavicular or axillary adenopathy.     Left upper body: No supraclavicular or axillary adenopathy.  Neurological:     General: No focal deficit present.     Mental Status: He is alert and oriented to person, place, and time.  Psychiatric:        Mood and Affect: Mood normal.        Behavior: Behavior normal.     LABS:      Latest Ref Rng & Units 02/04/2022   10:07 AM 12/14/2021   11:24 AM 10/06/2021    3:42 PM  CBC  WBC 4.0 - 10.5 K/uL 5.3  5.7  5.7   Hemoglobin 13.0 - 17.0 g/dL 10.4  11.9  12.1   Hematocrit 39.0 - 52.0 % 31.7  35.1  35.1   Platelets 150 - 400 K/uL 186  207  176       Latest Ref Rng & Units 02/04/2022   10:07 AM 12/14/2021   11:24 AM 11/11/2021   12:00 AM  CMP  Glucose 70 - 99 mg/dL 109  91  108   BUN 8 - 23 mg/dL 20  26  19    Creatinine 0.61 - 1.24 mg/dL 1.35  1.69  1.57   Sodium 135 - 145 mmol/L 139  138  145   Potassium 3.5 - 5.1 mmol/L 4.3  4.6  4.4   Chloride 98 - 111 mmol/L 104  103  103   CO2 22 - 32 mmol/L 26  29  27    Calcium 8.9 - 10.3 mg/dL 8.8  9.3  9.1   Total Protein 6.5 - 8.1 g/dL 6.8  6.8    Total Bilirubin 0.3 - 1.2 mg/dL 0.6  0.4    Alkaline Phos 38 -  126 U/L 40     AST 15 - 41 U/L 27  20    ALT 0 - 44 U/L 27  25       No results found for: "CEA1", "CEA" / No results found for: "CEA1", "CEA" No results found for: "PSA1" No results found for: "[id number]" No results found for: "CAN125"  No results found for: "TOTALPROTELP", "ALBUMINELP", "A1GS", "A2GS", "BETS", "BETA2SER", "GAMS", "MSPIKE", "SPEI" Lab Results  Component Value  Date   TIBC 398 02/04/2022   TIBC 360 12/14/2021   TIBC 347 10/28/2021   FERRITIN 93 02/04/2022   FERRITIN 79 12/14/2021   FERRITIN 59 10/28/2021   IRONPCTSAT 32 02/04/2022   IRONPCTSAT 36 12/14/2021   IRONPCTSAT 41 (H) 10/28/2021   No results found for: "LDH"  Final Diagnosis     A.  BLADDER, RIGHT POSTERIOR WALL, TRANSURETHRAL RESECTION: Urothelial carcinoma in situ. No evidence of invasion. Ulceration with granulation tissue, chronic and acute inflammation. Muscularis propria present, uninvolved by malignancy.   B.  BLADDER, LEFT ANTERIOR DOME, TRANSURETHRAL RESECTION: Urothelial carcinoma in situ. No evidence of invasion. Granulation tissue reaction, chronic and acute inflammation. Muscularis propria present, uninvolved by malignancy.   C.  BLADDER, LEFT LATERAL WALL SCAR, TRANSURETHRAL RESECTION: Urothelial carcinoma in situ. No evidence of invasion. Cystitis cystica with chronic inflammation and granulation tissue reaction. Muscularis propria present, uninvolved by malignancy.    STUDIES:   No results found.

## 2022-03-31 ENCOUNTER — Encounter: Payer: Self-pay | Admitting: Hematology

## 2022-03-31 ENCOUNTER — Inpatient Hospital Stay: Payer: Medicare Other | Attending: Hematology | Admitting: Hematology

## 2022-03-31 VITALS — BP 142/67 | HR 70 | Temp 98.0°F | Resp 18 | Wt 221.4 lb

## 2022-03-31 DIAGNOSIS — C678 Malignant neoplasm of overlapping sites of bladder: Secondary | ICD-10-CM

## 2022-03-31 DIAGNOSIS — N189 Chronic kidney disease, unspecified: Secondary | ICD-10-CM | POA: Diagnosis not present

## 2022-03-31 DIAGNOSIS — D509 Iron deficiency anemia, unspecified: Secondary | ICD-10-CM | POA: Diagnosis not present

## 2022-03-31 DIAGNOSIS — D090 Carcinoma in situ of bladder: Secondary | ICD-10-CM | POA: Insufficient documentation

## 2022-03-31 DIAGNOSIS — Z87891 Personal history of nicotine dependence: Secondary | ICD-10-CM | POA: Insufficient documentation

## 2022-03-31 DIAGNOSIS — Z8042 Family history of malignant neoplasm of prostate: Secondary | ICD-10-CM | POA: Insufficient documentation

## 2022-03-31 DIAGNOSIS — Z809 Family history of malignant neoplasm, unspecified: Secondary | ICD-10-CM | POA: Insufficient documentation

## 2022-03-31 DIAGNOSIS — G629 Polyneuropathy, unspecified: Secondary | ICD-10-CM | POA: Diagnosis not present

## 2022-03-31 DIAGNOSIS — C090 Malignant neoplasm of tonsillar fossa: Secondary | ICD-10-CM | POA: Diagnosis present

## 2022-03-31 NOTE — Progress Notes (Signed)
START ON PATHWAY REGIMEN - Bladder     One cycle, concurrent with RT:     Fluorouracil      Mitomycin   **Always confirm dose/schedule in your pharmacy ordering system**  Patient Characteristics: Pre-Cystectomy or Nonsurgical Candidate, M0 (Clinical Staging), cT2-4a, cN0-1, M0, Bladder-Sparing Approach Therapeutic Status: Pre-Cystectomy or Nonsurgical Candidate, M0 (Clinical Staging) AJCC M Category: cM0 AJCC 8 Stage Grouping: II AJCC T Category: cT2 AJCC N Category: cN0 Intent of Therapy: Curative Intent, Discussed with Patient

## 2022-03-31 NOTE — Patient Instructions (Addendum)
[place] at [facility] Discharge Instructions   You were seen and examined today by Dr. Delton Coombes.  He discussed with you the plan to treat the bladder cancer with two chemotherapy drugs. Those drugs are called mitomycin and fluorouracil (5FU). You will come to the clinic to get the mitomycin. This is an IV push drug that is given over about 2-3 minutes. Then you will be hooked up to an ambulatory pump that the 5FU is given through. You will wear the pump for 5 days. On the 5th day you will come to the clinic to get the pump taken off. This treatment is given on the first week and the fourth week of treatment. We will start our treatment in conjunction with radiation therapy.   You may cut back on the iron pill you are taking to once a day.   Return as scheduled.    Thank you for choosing [place] at [facility] to provide your oncology and hematology care.  To afford each patient quality time with our provider, please arrive at least 15 minutes before your scheduled appointment time.   If you have a lab appointment with the [place] please come in thru the Main Entrance and check in at the main information desk.  You need to re-schedule your appointment should you arrive 10 or more minutes late.  We strive to give you quality time with our providers, and arriving late affects you and other patients whose appointments are after yours.  Also, if you no show three or more times for appointments you may be dismissed from the clinic at the providers discretion.     Again, thank you for choosing [facility].  Our hope is that these requests will decrease the amount of time that you wait before being seen by our physicians.       _____________________________________________________________  Should you have questions after your visit to [facility], please contact our office at [phone] and follow the prompts.   Our office hours are 8:00 a.m. and 4:30 p.m. Monday - Friday.  Please note that voicemails left after 4:00 p.m. may not be returned until the following business day.  We are closed weekends and major holidays.  You do have access to a nurse 24-7, just call the main number to the clinic [phone] and do not press any options, hold on the line and a nurse will answer the phone.    For prescription refill requests, have your pharmacy contact our office and allow 72 hours.    Due to Covid, you will need to wear a mask upon entering the hospital. If you do not have a mask, a mask will be given to you at the Main Entrance upon arrival. For doctor visits, patients may have 1 support person age 71 or older with them. For treatment visits, patients can not have anyone with them due to social distancing guidelines and our immunocompromised population.

## 2022-04-01 ENCOUNTER — Inpatient Hospital Stay: Payer: Medicare Other

## 2022-04-01 DIAGNOSIS — C678 Malignant neoplasm of overlapping sites of bladder: Secondary | ICD-10-CM

## 2022-04-01 MED ORDER — PROCHLORPERAZINE MALEATE 10 MG PO TABS: 10.0000 mg | ORAL_TABLET | Freq: Four times a day (QID) | ORAL | 1 refills | Status: DC | PRN

## 2022-04-01 MED ORDER — LIDOCAINE-PRILOCAINE 2.5-2.5 % EX CREA: TOPICAL_CREAM | CUTANEOUS | 3 refills | Status: DC

## 2022-04-01 NOTE — Progress Notes (Signed)

## 2022-04-02 ENCOUNTER — Encounter: Payer: Self-pay | Admitting: *Deleted

## 2022-04-02 ENCOUNTER — Ambulatory Visit: Payer: Self-pay | Admitting: *Deleted

## 2022-04-02 ENCOUNTER — Ambulatory Visit: Payer: Medicare Other | Admitting: Urology

## 2022-04-02 NOTE — Patient Outreach (Signed)
  Care Coordination   Follow Up Visit Note   04/02/2022 Name: [name] MRN: [id number] DOB: [birth year]  [name] is a 77 y.o. year old male who sees Sharilyn Sites, MD for primary care. I spoke with  [name] by phone today.  What matters to the patients health and wellness today?  Managing bladder cancer and treatments    Goals Addressed             This Visit's Progress    Care Coordination Services r/t Bladder Cancer       Care Coordination Goals: Patient will keep all scheduled medical appointments Patient will reach out to provider with any new or worsening symptoms or side effects Patient will take medication as prescribed Patient will remain physically active and rest as needed Patient will eat a balanced diet and work with nutritionist Patient will reach out to [facility] Coordinator [phone] with any care coordination or resource needs        SDOH assessments and interventions completed:  Yes  SDOH Interventions Today    Flowsheet Row Most Recent Value  SDOH Interventions   Transportation Interventions Intervention Not Indicated  Financial Strain Interventions Intervention Not Indicated        Care Coordination Interventions:  Yes, provided  Interventions Today    Flowsheet Row Most Recent Value  Chronic Disease   Chronic disease during today's visit Other  [Bladder Cancer]  General Interventions   General Interventions Discussed/Reviewed General Interventions Discussed, Doctor Visits  [reviewed imaging reports and upcoming appointments with providers and CT Simulation test.]  Doctor Visits Discussed/Reviewed Doctor Visits Discussed, Specialist, Doctor Visits Reviewed  PCP/Specialist Visits Compliance with follow-up visit  Exercise Interventions   Exercise Discussed/Reviewed Exercise Reviewed, Physical Activity  Physical Activity Discussed/Reviewed Physical Activity Reviewed  Education Interventions   Education Provided  Provided Education  Provided Verbal Education On When to see the doctor, Mental Health/Coping with Illness  [reviewed directions and location for upcoming CT simulation test as well as telephone call with [place] Social Worker and [place] Nutritionist]  Nutrition Interventions   Nutrition Discussed/Reviewed Nutrition Reviewed       Follow up plan: Follow up call scheduled for 05/06/22    Encounter Outcome:  Pt. Visit Completed   [name], BSN, RN-BC RN Care Coordinator [place]: [phone] Main #: [phone]

## 2022-04-05 ENCOUNTER — Other Ambulatory Visit: Payer: Self-pay | Admitting: *Deleted

## 2022-04-05 ENCOUNTER — Ambulatory Visit
Admission: RE | Admit: 2022-04-05 | Discharge: 2022-04-05 | Disposition: A | Discharge status: Home / Self Care | Payer: Medicare Other | Source: Ambulatory Visit | Attending: Radiation Oncology | Admitting: Radiation Oncology

## 2022-04-05 DIAGNOSIS — C672 Malignant neoplasm of lateral wall of bladder: Secondary | ICD-10-CM | POA: Insufficient documentation

## 2022-04-05 DIAGNOSIS — Z510 Encounter for antineoplastic radiation therapy: Secondary | ICD-10-CM | POA: Insufficient documentation

## 2022-04-05 DIAGNOSIS — C678 Malignant neoplasm of overlapping sites of bladder: Secondary | ICD-10-CM

## 2022-04-07 NOTE — Progress Notes (Signed)
  Radiation Oncology         [phone] ________________________________  Name: [name] MRN: [id number]  Date: 04/05/2022  DOB: March 28, 1945  SIMULATION AND TREATMENT PLANNING NOTE    ICD-10-CM   1. Malignant neoplasm of overlapping sites of bladder ([place])  C67.8       DIAGNOSIS:   77 y/o man with muscle invasive bladder cancer of the left lateral wall  NARRATIVE:  The patient was brought to the [place].  Identity was confirmed.  All relevant records and images related to the planned course of therapy were reviewed.  The patient freely provided informed written consent to proceed with treatment after reviewing the details related to the planned course of therapy. The consent form was witnessed and verified by the simulation staff.  Then, the patient was set-up in a stable reproducible  supine position for radiation therapy.  Contrast was instilled into the bladder with a catheter under sterile conditions.  CT images were obtained.  Surface markings were placed.  The CT images were loaded into the planning software.  Then the target and avoidance structures were contoured.  Treatment planning then occurred.  The radiation prescription was entered and confirmed.  Then, I designed and supervised the construction of a total of 5 medically necessary complex treatment devices including VacLoc body positioner and 4 MLCs to shield the bowel and femoral necks.  I have requested : 3D Simulation  I have requested a DVH of the following structures: small bowel, rectum, left femoral head, right femoral head and targets.  SPECIAL TREATMENT PROCEDURE:  The planned course of therapy using radiation constitutes a special treatment procedure. Special care is required in the management of this patient for the following reasons. This treatment constitutes a Special Treatment Procedure for the following reason: [ Concurrent chemotherapy requiring careful monitoring for increased toxicities  of treatment including weekly laboratory values..  The special nature of the planned course of radiotherapy will require increased physician supervision and oversight to ensure patient's safety with optimal treatment outcomes.  PLAN:  The patient will receive 19.8 Gy in 11 fractions of 1.8 Gy to the bladder tumor with full bladder, then 45 Gy in 25 fractions to the whole bladder and pelvic nodes to a total dose of 64.8 Gy.  ________________________________  Sheral Apley Tammi Klippel, M.D.

## 2022-04-08 ENCOUNTER — Inpatient Hospital Stay: Payer: Medicare Other | Admitting: Licensed Clinical Social Worker

## 2022-04-08 DIAGNOSIS — Z510 Encounter for antineoplastic radiation therapy: Secondary | ICD-10-CM | POA: Diagnosis not present

## 2022-04-08 DIAGNOSIS — C678 Malignant neoplasm of overlapping sites of bladder: Secondary | ICD-10-CM

## 2022-04-08 DIAGNOSIS — C672 Malignant neoplasm of lateral wall of bladder: Secondary | ICD-10-CM | POA: Diagnosis not present

## 2022-04-09 ENCOUNTER — Encounter: Payer: Self-pay | Admitting: Hematology

## 2022-04-09 ENCOUNTER — Ambulatory Visit: Payer: Medicare Other | Admitting: Radiation Oncology

## 2022-04-09 NOTE — Progress Notes (Signed)
[place] Work  Initial Assessment   [name] is a 77 y.o. year old male contacted by phone. Clinical Social Work was referred by medical provider for assessment of psychosocial needs.   SDOH (Social Determinants of Health) assessments performed: Yes SDOH Interventions    Flowsheet Row Care Coordination from 04/02/2022 in [place] Coordination from 02/18/2022 in [place] Telephone from 02/17/2022 in [place] Office Visit from 02/04/2022 in [place] at [place] from 02/01/2022 in [place] Coordination from 01/07/2022 in [place] Interventions        Food Insecurity Interventions -- -- -- Intervention Not Indicated -- Intervention Not Indicated  Housing Interventions -- -- -- Intervention Not Indicated -- Intervention Not Indicated  Transportation Interventions Intervention Not Indicated Intervention Not Indicated Intervention Not Indicated Intervention Not Indicated Intervention Not Indicated Intervention Not Indicated  Utilities Interventions -- -- -- Intervention Not Indicated -- --  Financial Strain Interventions Intervention Not Indicated Intervention Not Indicated Intervention Not Indicated -- -- Intervention Not Indicated       SDOH Screenings   Food Insecurity: No Food Insecurity (02/04/2022)  Housing: [place]  (02/04/2022)  Transportation Needs: No Transportation Needs (04/02/2022)  Utilities: Not At Risk (02/04/2022)  Depression (PHQ2-9): Low Risk  (02/04/2022)  Financial Resource Strain: Low Risk  (04/02/2022)  Tobacco Use: Medium Risk (04/02/2022)     Distress Screen completed: Yes    02/12/2022   10:39 AM  ONCBCN DISTRESS SCREENING  Screening Type Initial Screening  Distress experienced in past week  (1-10) 0  Physical Problem type Constipation/diarrhea;Changes in urination;Tingling hands/feet      Family/Social Information:  Housing Arrangement: patient lives alone Family members/support persons in your life? Pt states his girlfriend resides 45 minutes away, but will be available to provide assistance as needed.  Pt has a daughter residing 1 1/2 hrs away who is currently in [place] for 6 months. 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  Income source: Employment and Paediatric nurse concerns: No Type of concern: None Food access concerns: no Religious or spiritual practice: Hydrographic surveyor Currently in place:  none  Coping/ Adjustment to diagnosis: Patient understands treatment plan and what happens next? yes Concerns about diagnosis and/or treatment: Quality of life Patient reported stressors: Adjusting to my illness Hopes and/or priorities: Pt's priority is to start treatment w/ the hope of positive results Patient enjoys time with family/ friends Current coping skills/ strengths: Capable of independent living , Scientist, research (life sciences) , Motivation for treatment/growth , Physical Health , and Supportive family/friends     SUMMARY: Current SDOH Barriers:  No barriers identified at this time  Clinical Social Work Clinical Goal(s):  No clinical social work goals at this time  Interventions: Discussed common feeling and emotions when being diagnosed with cancer, and the importance of support during treatment Informed patient of the support team roles and support services at [facility] Provided [place] contact information and encouraged patient to call with any questions or concerns Referred patient to [facility].   Follow Up Plan: Patient will contact CSW with any support or resource needs Patient verbalizes understanding of plan: Yes    Henriette Combs, LCSW

## 2022-04-12 ENCOUNTER — Other Ambulatory Visit: Payer: Self-pay

## 2022-04-12 ENCOUNTER — Ambulatory Visit
Admission: RE | Admit: 2022-04-12 | Discharge: 2022-04-12 | Disposition: A | Discharge status: Home / Self Care | Payer: Medicare Other | Source: Ambulatory Visit | Attending: Radiation Oncology | Admitting: Radiation Oncology

## 2022-04-12 DIAGNOSIS — D509 Iron deficiency anemia, unspecified: Secondary | ICD-10-CM | POA: Diagnosis not present

## 2022-04-12 DIAGNOSIS — Z510 Encounter for antineoplastic radiation therapy: Secondary | ICD-10-CM | POA: Insufficient documentation

## 2022-04-12 DIAGNOSIS — Z79899 Other long term (current) drug therapy: Secondary | ICD-10-CM | POA: Diagnosis not present

## 2022-04-12 DIAGNOSIS — Z5111 Encounter for antineoplastic chemotherapy: Secondary | ICD-10-CM | POA: Diagnosis not present

## 2022-04-12 DIAGNOSIS — C672 Malignant neoplasm of lateral wall of bladder: Secondary | ICD-10-CM | POA: Insufficient documentation

## 2022-04-12 LAB — RAD ONC ARIA SESSION SUMMARY
Course Elapsed Days: 0
Plan Fractions Treated to Date: 1
Plan Prescribed Dose Per Fraction: 1.8 Gy
Plan Total Fractions Prescribed: 11
Plan Total Prescribed Dose: 19.8 Gy
Reference Point Dosage Given to Date: 1.8 Gy
Reference Point Session Dosage Given: 1.8 Gy
Session Number: 1

## 2022-04-13 ENCOUNTER — Other Ambulatory Visit: Payer: Self-pay

## 2022-04-13 ENCOUNTER — Ambulatory Visit
Admission: RE | Admit: 2022-04-13 | Discharge: 2022-04-13 | Disposition: A | Discharge status: Home / Self Care | Payer: Medicare Other | Source: Ambulatory Visit | Attending: Radiation Oncology | Admitting: Radiation Oncology

## 2022-04-13 DIAGNOSIS — D509 Iron deficiency anemia, unspecified: Secondary | ICD-10-CM | POA: Diagnosis not present

## 2022-04-13 DIAGNOSIS — C672 Malignant neoplasm of lateral wall of bladder: Secondary | ICD-10-CM | POA: Diagnosis not present

## 2022-04-13 DIAGNOSIS — Z79899 Other long term (current) drug therapy: Secondary | ICD-10-CM | POA: Diagnosis not present

## 2022-04-13 DIAGNOSIS — Z510 Encounter for antineoplastic radiation therapy: Secondary | ICD-10-CM | POA: Diagnosis not present

## 2022-04-13 DIAGNOSIS — Z5111 Encounter for antineoplastic chemotherapy: Secondary | ICD-10-CM | POA: Diagnosis not present

## 2022-04-13 LAB — RAD ONC ARIA SESSION SUMMARY
Course Elapsed Days: 1
Plan Fractions Treated to Date: 2
Plan Prescribed Dose Per Fraction: 1.8 Gy
Plan Total Fractions Prescribed: 11
Plan Total Prescribed Dose: 19.8 Gy
Reference Point Dosage Given to Date: 3.6 Gy
Reference Point Session Dosage Given: 1.8 Gy
Session Number: 2

## 2022-04-13 NOTE — Progress Notes (Signed)
[facility] [address], [place] [address]    Clinic Day:  04/14/2022  Referring physician: Sharilyn Sites, MD  Patient Care Team: Steve Sites, MD as PCP - General (Family Medicine) Steve Mocha, MD as PCP - Cardiology (Cardiology) Steve China, RN as [place] Management Steve Jack, MD as Medical Oncologist (Medical Oncology) Steve Mates, RN as Oncology Nurse Navigator (Medical Oncology)   ASSESSMENT & PLAN:   Assessment: 1.  T2 N0 bladder cancer (multifocal): - 12/16/2014: Initial diagnosis, malignant neoplasm of lateral wall of bladder - 03/14/2018: TURBT (pathology-invasive papillary urothelial carcinoma, high-grade, tumor involves lamina propria, muscularis propria present, not involved by tumor) - 05/04/2018 - 06/13/2018: Initial BCG induction course - 08/22/2018: Bladder biopsy HG Ta, multifocal - 08/29/2018 - 10/01/2018: Intravesicular valrubicin - 02/20/2019: Bladder biopsy-benign - 09/15/2019: TURBT- HG Ta, muscle present - 10/24/2019 - 11/28/2019: Completion of BCG induction course x 6 - 05/14/2020: TURBT-HG Ta, muscle present - 07/02/2020 - 08/20/2020: Intravesical gemcitabine/docetaxel - 03/10/2021: TURBT-HG Ta + CIS, muscle present - 04/29/2021 - 06/03/2021: Reinduction intravesical gemcitabine/docetaxel - 07/06/2021: Cystoscopy without any visible tumors, cytology with high-grade urothelial carcinoma - 12/30/2021: Progression with lesions throughout the bladder, muscle invasive at left lateral wall - He has chronic neuropathy, CKD which precludes him from cisplatin-based neoadjuvant chemotherapy followed by cystectomy. - He met with radiation oncology at [facility].  Due to multifocal nature of bladder cancer, they recommended him to go for surgery.  He preferred trimodality therapy. - Cystoscopy and TURBT (03/24/2022): At [facility] with specimens taken from right posterior wall bladder tumor, left anterior dome bladder tumor  and left lateral wall scar.  Pathology shows urothelial carcinoma in situ with no evidence of invasion in all 3 specimens.  Muscularis propria present, uninvolved by malignancy. - XRT started on 04/12/2022, cycle 1 of 5-FU and mitomycin on 04/14/2022   2.  Social/family history: - He lives at home by himself.  He is seen today with his daughter.  He is independent of ADLs and IADLs.  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  He dances 1-2 times per week.  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  Quit smoking 50 years ago. - Father had prostate cancer.  Maternal grandmother had cancer, type unknown to the patient.   Plan: 1.  T2 N0 multifocal bladder cancer: - He started radiation therapy on Monday on 04/12/2022. - Reviewed labs today which showed creatinine elevated at 1.45.  LFTs are normal.  CBC was grossly normal with microcytic anemia. - Recommend proceeding with cycle 1 of mitomycin and 5.  Discussed side effects in detail.  RTC 2 weeks for nadir counts.   2.  Normocytic to microcytic anemia: - Feraheme was given on 02/16/2022 and 02/23/2022. - Hemoglobin today is 11.  Will closely monitor.  Orders Placed This Encounter  Procedures   CBC with Differential    Standing Status:   Future    Standing Expiration Date:   04/14/2023   Comprehensive metabolic panel    Standing Status:   Future    Standing Expiration Date:   04/14/2023   Magnesium    Standing Status:   Future    Standing Expiration Date:   04/14/2023      I,[name],acting as a scribe for Steve Jack, MD.,have documented all relevant documentation on the behalf of Steve Jack, MD,as directed by  Steve Jack, MD while in the presence of Steve Jack, MD.  I, Steve Jack MD, have reviewed the above documentation for accuracy and completeness, and I agree with the above.   Steve Jack, MD   4/3/20245:35 PM  CHIEF COMPLAINT:   Diagnosis: T2 N0  bladder cancer    Cancer Staging  Bladder cancer Staging form: Urinary Bladder, AJCC 8th Edition - Clinical stage from 01/29/2022: Stage II (cT2, cN0, cM0) - Unsigned    Prior Therapy: none  Current Therapy:  Trimodality therapy    HISTORY OF PRESENT ILLNESS:   Oncology History  Bladder cancer  01/29/2022 Initial Diagnosis   Bladder cancer ([place])   04/14/2022 -  Chemotherapy   Patient is on Treatment Plan : BLADDER Mitomycin D1 + 5FU D1-5, 21-25 + XRT        INTERVAL HISTORY:   [name] is a 77 y.o. male presenting to clinic today for follow up of T2 N0 bladder cancer. He was last seen by me on 03/31/22.  Since his last visit, he began radiation therapy on Monday, 04/12/22.  Today, he states that he is doing well overall. His appetite level is at 100%. His energy level is at 80%.  PAST MEDICAL HISTORY:   Past Medical History: Past Medical History:  Diagnosis Date   Anginal pain ([place])    Arthritis    "all over"   Bladder tumor    Chronic lower back pain    Coronary artery disease    a. s/p CABG x 2 (LIMA->LAD, RIMA->RCA);  b. s/p multiple PCI's to Ramus;  c. 08/2011 Cath/PCI: LM 70% into ramus with 70-80% there->treated wtih 3.5x18 Xience Xpedition DES, LCX  nonobs, RCA occluded.  RIMA & LIMA patent, EF 55-65%   DJD (degenerative joint disease)    GERD (gastroesophageal reflux disease)    History of gout    Hyperlipidemia    Hypertension    Hypothyroidism    Neuropathy    OSA on CPAP    Prostate cancer [facility])     Surgical History: Past Surgical History:  Procedure Laterality Date   ABDOMINAL HERNIA REPAIR     BIOPSY  11/26/2015   Procedure: BIOPSY;  Surgeon: Steve Houston, MD;  [facility];  Service: Endoscopy;;  gastric esophagus   BIOPSY  01/20/2021   Procedure: BIOPSY;  Surgeon: Steve Quale, MD;  [facility];  Service: Gastroenterology;;   BIOPSY  10/28/2021   Procedure: BIOPSY;  Surgeon: Steve Quale, MD;   [facility];  Service: Gastroenterology;;   BLADDER TUMOR EXCISION     BROW LIFT Bilateral 04/27/2019   Procedure: BILATERAL BLEPHAROPLASTY;  Surgeon: Steve Goldmann, MD;  Location: [facility];  Service: Ophthalmology;  Laterality: Bilateral;   CARDIAC CATHETERIZATION N/A 04/10/2015   Procedure: Left Heart Cath and Cors/Grafts Angiography;  Surgeon: Steve Mocha, MD;  Location: [place] [facility] CV LAB;  Service: Cardiovascular;  Laterality: N/A;   CARDIAC CATHETERIZATION N/A 04/10/2015   Procedure: Coronary Stent Intervention;  Surgeon: Steve Mocha, MD;  Location: [place] CV LAB;  Service: Cardiovascular;  Laterality: N/A;   CATARACT EXTRACTION W/PHACO Right 06/23/2015   Procedure: CATARACT EXTRACTION PHACO AND INTRAOCULAR LENS PLACEMENT RIGHT EYE; CDE:  7.08;  Surgeon: Tonny Branch, MD;  Location: [facility];  Service: Ophthalmology;  Laterality: Right;   CATARACT EXTRACTION W/PHACO Left 08/25/2015   Procedure: CATARACT EXTRACTION PHACO AND INTRAOCULAR LENS PLACEMENT LEFT EYE; CDE:  8.18;  Surgeon: Tonny Branch, MD;  Location: [facility];  Service: Ophthalmology;  Laterality: Left;   COLONOSCOPY N/A 12/28/2012  Procedure: COLONOSCOPY;  Surgeon: Steve Houston, MD;  [facility];  Service: Endoscopy;  Laterality: N/A;  830 rescheduled   COLONOSCOPY WITH PROPOFOL N/A 01/20/2021   Procedure: COLONOSCOPY WITH PROPOFOL;  Surgeon: Steve Quale, MD;  [facility];  Service: Gastroenterology;  Laterality: N/A;  11:05   CORONARY ANGIOPLASTY WITH STENT PLACEMENT  2013; 04/10/2015   "this makes me a total of 7" (04/10/2015)   CORONARY ARTERY BYPASS GRAFT  1997   with (LIMA)   ESOPHAGEAL DILATION N/A 11/26/2015   Procedure: ESOPHAGEAL DILATION;  Surgeon: Steve Houston, MD;  [facility];  Service: Endoscopy;  Laterality: N/A;   ESOPHAGEAL DILATION N/A 05/23/2017   Procedure: ESOPHAGEAL DILATION;  Surgeon: Steve Houston, MD;  [facility];  Service:  Endoscopy;  Laterality: N/A;   ESOPHAGEAL DILATION N/A 06/16/2017   Procedure: ESOPHAGEAL DILATION;  Surgeon: Steve Houston, MD;  [facility];  Service: Endoscopy;  Laterality: N/A;   ESOPHAGEAL DILATION  10/28/2021   Procedure: ESOPHAGEAL DILATION;  Surgeon: Steve Quale, MD;  [facility];  Service: Gastroenterology;;   ESOPHAGOGASTRODUODENOSCOPY N/A 11/26/2015   Procedure: ESOPHAGOGASTRODUODENOSCOPY (EGD);  Surgeon: Steve Houston, MD;  [facility];  Service: Endoscopy;  Laterality: N/A;  1:25   ESOPHAGOGASTRODUODENOSCOPY N/A 05/23/2017   Procedure: ESOPHAGOGASTRODUODENOSCOPY (EGD);  Surgeon: Steve Houston, MD;  [facility];  Service: Endoscopy;  Laterality: N/A;  1:15   ESOPHAGOGASTRODUODENOSCOPY N/A 06/16/2017   Procedure: ESOPHAGOGASTRODUODENOSCOPY (EGD);  Surgeon: Steve Houston, MD;  [facility];  Service: Endoscopy;  Laterality: N/A;  1240   ESOPHAGOGASTRODUODENOSCOPY (EGD) WITH PROPOFOL N/A 01/20/2021   Procedure: ESOPHAGOGASTRODUODENOSCOPY (EGD) WITH PROPOFOL;  Surgeon: Steve Quale, MD;  [facility];  Service: Gastroenterology;  Laterality: N/A;   ESOPHAGOGASTRODUODENOSCOPY (EGD) WITH PROPOFOL N/A 10/28/2021   Procedure: ESOPHAGOGASTRODUODENOSCOPY (EGD) WITH PROPOFOL;  Surgeon: Steve Quale, MD;  [facility];  Service: Gastroenterology;  Laterality: N/A;  215 ASA 2, pt will arrive at 10:45   HERNIA REPAIR Left    IR IMAGING GUIDED PORT INSERTION  02/24/2022   KNEE CARTILAGE SURGERY Bilateral    LEFT HEART CATH AND CORS/GRAFTS ANGIOGRAPHY N/A 09/08/2016   Procedure: LEFT HEART CATH AND CORS/GRAFTS ANGIOGRAPHY;  Surgeon: Martinique, Peter M, MD;  Location: [place] CV LAB;  Service: Cardiovascular;  Laterality: N/A;   LEFT HEART CATHETERIZATION WITH CORONARY ANGIOGRAM N/A 09/08/2011   Procedure: LEFT HEART CATHETERIZATION WITH CORONARY ANGIOGRAM;  Surgeon: Steve Mocha, MD;  Location: [facility] CATH LAB;  Service: Cardiovascular;  Laterality: N/A;   PERCUTANEOUS CORONARY STENT INTERVENTION (PCI-S) Right 09/08/2011   Procedure: PERCUTANEOUS CORONARY STENT INTERVENTION (PCI-S);  Surgeon: Steve Mocha, MD;  Location: [facility] CATH LAB;  Service: Cardiovascular;  Laterality: Right;   POLYPECTOMY  01/20/2021   Procedure: POLYPECTOMY;  Surgeon: Steve Quale, MD;  [facility];  Service: Gastroenterology;;   ROBOT ASSISTED LAPAROSCOPIC RADICAL PROSTATECTOMY      Social History: Social History   Socioeconomic History   Marital status: Widowed    Spouse name: Not on file   Number of children: Not on file   Years of education: Not on file   Highest education level: Not on file  Occupational History   Occupation: semi-retired  Tobacco Use   Smoking status: Former    Types: Cigars    Passive exposure: Past   Smokeless tobacco: Never   Tobacco comments:    "  quit smoking in ~ 1978"  Vaping Use   Vaping Use: Never used  Substance and Sexual Activity   Alcohol use: Yes    Comment: occasionally    Drug use: No   Sexual activity: Not Currently  Other Topics Concern   Not on file  Social History Narrative   Not on file   Social Determinants of Health   Financial Resource Strain: Low Risk  (04/02/2022)   Overall Financial Resource Strain (CARDIA)    Difficulty of Paying Living Expenses: Not hard at all  Food Insecurity: No Food Insecurity (02/04/2022)   Hunger Vital Sign    Worried About Running Out of Food in the Last Year: Never true    Ran Out of Food in the Last Year: Never true  Transportation Needs: No Transportation Needs (04/02/2022)   PRAPARE - Hydrologist (Medical): No    Lack of Transportation (Non-Medical): No  Physical Activity: Not on file  Stress: Not on file  Social Connections: Not on file  Intimate Partner Violence: Not At Risk (02/04/2022)   Humiliation, Afraid, Rape, and Kick  questionnaire    Fear of Current or Ex-Partner: No    Emotionally Abused: No    Physically Abused: No    Sexually Abused: No    Family History: Family History  Problem Relation Age of Onset   Cancer Father 73       died   Stroke Mother 56       died    Current Medications:  Current Outpatient Medications:    amLODipine (NORVASC) 10 MG tablet, Take 10 mg by mouth in the morning., Disp: , Rfl:    aspirin EC 81 MG tablet, Take 81 mg by mouth every evening., Disp: 30 tablet, Rfl:    atorvastatin (LIPITOR) 40 MG tablet, Take 0.5 tablets (20 mg total) by mouth every evening., Disp: 45 tablet, Rfl: 3   carvedilol (COREG) 6.25 MG tablet, Take 1 tablet (6.25 mg total) by mouth 2 (two) times daily., Disp: 180 tablet, Rfl: 3   cetirizine (ZYRTEC) 10 MG tablet, Take 10 mg by mouth every evening., Disp: , Rfl:    cholecalciferol (VITAMIN D3) 25 MCG (1000 UNIT) tablet, Take 1,000 Units by mouth every evening., Disp: , Rfl:    clopidogrel (PLAVIX) 75 MG tablet, Take 1 tablet (75 mg total) by mouth in the morning., Disp: 90 tablet, Rfl: 3   Coenzyme Q10 300 MG CAPS, Take 300 mg by mouth every evening., Disp: , Rfl:    cyanocobalamin (,VITAMIN B-12,) 1000 MCG/ML injection, Inject 1,000 mcg into the muscle See admin instructions. Inject (1000 mcg) intramuscularly once monthly as needed for B-12 deficiency., Disp: , Rfl: 2   fenofibrate 160 MG tablet, Take 1 tablet (160 mg total) by mouth daily. (Patient taking differently: Take 160 mg by mouth every evening.), Disp: 30 tablet, Rfl: 11   FEROSUL 325 (65 Fe) MG tablet, TAKE 1 TABLET(325 MG) BY MOUTH TWICE DAILY WITH A MEAL, Disp: 180 tablet, Rfl: 3   gabapentin (NEURONTIN) 600 MG tablet, Take 600 mg by mouth 3 (three) times daily., Disp: , Rfl: 3   GLUCOSAMINE-CHONDROITIN PO, Take 1 tablet by mouth every evening., Disp: , Rfl:    isosorbide mononitrate (IMDUR) 60 MG 24 hr tablet, Take 1.5 tablets (90 mg total) by mouth daily. (Patient taking differently:  Take 90 mg by mouth every evening.), Disp: 135 tablet, Rfl: 3   levothyroxine (SYNTHROID, LEVOTHROID) 150 MCG tablet, Take 150 mcg  by mouth daily before breakfast., Disp: , Rfl:    lidocaine-prilocaine (EMLA) cream, Apply to affected area once, Disp: 30 g, Rfl: 3   meclizine (ANTIVERT) 25 MG tablet, Take 1 tablet (25 mg total) by mouth 2 (two) times daily as needed for dizziness. (Patient taking differently: Take 12.5 mg by mouth at bedtime as needed for dizziness.), Disp: 30 tablet, Rfl: 0   nitroGLYCERIN (NITROSTAT) 0.4 MG SL tablet, Place 1 tablet (0.4 mg total) under the tongue every 5 (five) minutes as needed for chest pain., Disp: 25 tablet, Rfl: 11   Omega-3 Fatty Acids (FISH OIL) 1000 MG CAPS, Take 1 capsule by mouth in the morning., Disp: , Rfl:    Polyethyl Glycol-Propyl Glycol (LUBRICANT EYE DROPS) 0.4-0.3 % SOLN, Place 1-2 drops into both eyes 3 (three) times daily as needed (dry/irritated eyes.)., Disp: , Rfl:    prochlorperazine (COMPAZINE) 10 MG tablet, Take 1 tablet (10 mg total) by mouth every 6 (six) hours as needed for nausea or vomiting., Disp: 30 tablet, Rfl: 1   psyllium (METAMUCIL) 58.6 % powder, Take 1 packet by mouth 2 (two) times a week., Disp: , Rfl:    traMADol (ULTRAM) 50 MG tablet, Take 50 mg by mouth 3 (three) times daily., Disp: , Rfl: 1 No current facility-administered medications for this visit.  Facility-Administered Medications Ordered in Other Visits:    fluorouracil (ADRUCIL) 5,700 mg in sodium chloride 0.9 % 136 mL chemo infusion, 500 mg/m2/day (Treatment Plan Recorded), Intravenous, 5 days, Steve Jack, MD, Infusion Verify at 04/14/22 1502   Allergies: Allergies  Allergen Reactions   Codeine Nausea Only   Pollen Extract Other (See Comments)    Seasonal    REVIEW OF SYSTEMS:   Review of Systems  Constitutional:  Negative for chills, fatigue and fever.  HENT:   Negative for lump/mass, mouth sores, nosebleeds, sore throat and trouble  swallowing.   Eyes:  Negative for eye problems.  Respiratory:  Negative for cough and shortness of breath.   Cardiovascular:  Negative for chest pain, leg swelling and palpitations.  Gastrointestinal:  Negative for abdominal pain, constipation, diarrhea, nausea and vomiting.  Genitourinary:  Positive for frequency. Negative for bladder incontinence, difficulty urinating, dysuria, hematuria and nocturia.   Musculoskeletal:  Negative for arthralgias, back pain, flank pain, myalgias and neck pain.  Skin:  Negative for itching and rash.  Neurological:  Positive for dizziness and numbness. Negative for headaches.  Hematological:  Does not bruise/bleed easily.  Psychiatric/Behavioral:  Negative for depression, sleep disturbance and suicidal ideas. The patient is not nervous/anxious.   All other systems reviewed and are negative.    VITALS:   There were no vitals taken for this visit.  Wt Readings from Last 3 Encounters:  04/14/22 219 lb (99.3 kg)  03/31/22 221 lb 6.4 oz (100.4 kg)  02/24/22 216 lb (98 kg)    There is no height or weight on file to calculate BMI.  Performance status (ECOG): 1 - Symptomatic but completely ambulatory  PHYSICAL EXAM:   Physical Exam Vitals and nursing note reviewed. Exam conducted with a chaperone present.  Constitutional:      Appearance: Normal appearance.  Cardiovascular:     Rate and Rhythm: Normal rate and regular rhythm.     Pulses: Normal pulses.     Heart sounds: Normal heart sounds.  Pulmonary:     Effort: Pulmonary effort is normal.     Breath sounds: Normal breath sounds.  Abdominal:     Palpations: Abdomen is  soft. There is no hepatomegaly, splenomegaly or mass.     Tenderness: There is no abdominal tenderness.  Musculoskeletal:     Right lower leg: No edema.     Left lower leg: No edema.  Lymphadenopathy:     Cervical: No cervical adenopathy.     Right cervical: No superficial, deep or posterior cervical adenopathy.    Left  cervical: No superficial, deep or posterior cervical adenopathy.     Upper Body:     Right upper body: No supraclavicular or axillary adenopathy.     Left upper body: No supraclavicular or axillary adenopathy.  Neurological:     General: No focal deficit present.     Mental Status: He is alert and oriented to person, place, and time.  Psychiatric:        Mood and Affect: Mood normal.        Behavior: Behavior normal.     LABS:      Latest Ref Rng & Units 04/14/2022    9:31 AM 02/04/2022   10:07 AM 12/14/2021   11:24 AM  CBC  WBC 4.0 - 10.5 K/uL 4.2  5.3  5.7   Hemoglobin 13.0 - 17.0 g/dL 11.0  10.4  11.9   Hematocrit 39.0 - 52.0 % 32.8  31.7  35.1   Platelets 150 - 400 K/uL 214  186  207       Latest Ref Rng & Units 04/14/2022    9:31 AM 02/04/2022   10:07 AM 12/14/2021   11:24 AM  CMP  Glucose 70 - 99 mg/dL 113  109  91   BUN 8 - 23 mg/dL 21  20  26    Creatinine 0.61 - 1.24 mg/dL 1.45  1.35  1.69   Sodium 135 - 145 mmol/L 138  139  138   Potassium 3.5 - 5.1 mmol/L 3.8  4.3  4.6   Chloride 98 - 111 mmol/L 103  104  103   CO2 22 - 32 mmol/L 27  26  29    Calcium 8.9 - 10.3 mg/dL 8.9  8.8  9.3   Total Protein 6.5 - 8.1 g/dL 7.2  6.8  6.8   Total Bilirubin 0.3 - 1.2 mg/dL 0.6  0.6  0.4   Alkaline Phos 38 - 126 U/L 40  40    AST 15 - 41 U/L 21  27  20    ALT 0 - 44 U/L 23  27  25       No results found for: "CEA1", "CEA" / No results found for: "CEA1", "CEA" No results found for: "PSA1" No results found for: "[id number]" No results found for: "CAN125"  No results found for: "TOTALPROTELP", "ALBUMINELP", "A1GS", "A2GS", "BETS", "BETA2SER", "GAMS", "MSPIKE", "SPEI" Lab Results  Component Value Date   TIBC 398 02/04/2022   TIBC 360 12/14/2021   TIBC 347 10/28/2021   FERRITIN 93 02/04/2022   FERRITIN 79 12/14/2021   FERRITIN 59 10/28/2021   IRONPCTSAT 32 02/04/2022   IRONPCTSAT 36 12/14/2021   IRONPCTSAT 41 (H) 10/28/2021   No results found for: "LDH"   STUDIES:   No  results found.

## 2022-04-14 ENCOUNTER — Inpatient Hospital Stay: Payer: Medicare Other | Attending: Hematology

## 2022-04-14 ENCOUNTER — Ambulatory Visit
Admission: RE | Admit: 2022-04-14 | Discharge: 2022-04-14 | Disposition: A | Discharge status: Home / Self Care | Payer: Medicare Other | Source: Ambulatory Visit | Attending: Radiation Oncology | Admitting: Radiation Oncology

## 2022-04-14 ENCOUNTER — Other Ambulatory Visit: Payer: Self-pay

## 2022-04-14 ENCOUNTER — Inpatient Hospital Stay: Payer: Medicare Other | Admitting: Hematology

## 2022-04-14 ENCOUNTER — Inpatient Hospital Stay: Payer: Medicare Other

## 2022-04-14 DIAGNOSIS — Z5111 Encounter for antineoplastic chemotherapy: Secondary | ICD-10-CM | POA: Diagnosis not present

## 2022-04-14 DIAGNOSIS — C672 Malignant neoplasm of lateral wall of bladder: Secondary | ICD-10-CM | POA: Diagnosis not present

## 2022-04-14 DIAGNOSIS — C679 Malignant neoplasm of bladder, unspecified: Secondary | ICD-10-CM | POA: Diagnosis not present

## 2022-04-14 DIAGNOSIS — C678 Malignant neoplasm of overlapping sites of bladder: Secondary | ICD-10-CM

## 2022-04-14 DIAGNOSIS — Z79899 Other long term (current) drug therapy: Secondary | ICD-10-CM | POA: Diagnosis not present

## 2022-04-14 DIAGNOSIS — Z510 Encounter for antineoplastic radiation therapy: Secondary | ICD-10-CM | POA: Insufficient documentation

## 2022-04-14 DIAGNOSIS — D509 Iron deficiency anemia, unspecified: Secondary | ICD-10-CM | POA: Diagnosis not present

## 2022-04-14 LAB — MAGNESIUM: Magnesium: 2.2 mg/dL (ref 1.7–2.4)

## 2022-04-14 LAB — COMPREHENSIVE METABOLIC PANEL
ALT: 23 U/L (ref 0–44)
AST: 21 U/L (ref 15–41)
Albumin: 4 g/dL (ref 3.5–5.0)
Alkaline Phosphatase: 40 U/L (ref 38–126)
Anion gap: 8 (ref 5–15)
BUN: 21 mg/dL (ref 8–23)
CO2: 27 mmol/L (ref 22–32)
Calcium: 8.9 mg/dL (ref 8.9–10.3)
Chloride: 103 mmol/L (ref 98–111)
Creatinine, Ser: 1.45 mg/dL — ABNORMAL HIGH (ref 0.61–1.24)
GFR, Estimated: 49 mL/min — ABNORMAL LOW (ref >60)
Glucose, Bld: 113 mg/dL — ABNORMAL HIGH (ref 70–99)
Potassium: 3.8 mmol/L (ref 3.5–5.1)
Sodium: 138 mmol/L (ref 135–145)
Total Bilirubin: 0.6 mg/dL (ref 0.3–1.2)
Total Protein: 7.2 g/dL (ref 6.5–8.1)

## 2022-04-14 LAB — RAD ONC ARIA SESSION SUMMARY
Course Elapsed Days: 2
Plan Fractions Treated to Date: 3
Plan Prescribed Dose Per Fraction: 1.8 Gy
Plan Total Fractions Prescribed: 11
Plan Total Prescribed Dose: 19.8 Gy
Reference Point Dosage Given to Date: 5.4 Gy
Reference Point Session Dosage Given: 1.8 Gy
Session Number: 3

## 2022-04-14 LAB — CBC WITH DIFFERENTIAL/PLATELET
Abs Immature Granulocytes: 0.01 10*3/uL (ref 0.00–0.07)
Basophils Absolute: 0 10*3/uL (ref 0.0–0.1)
Basophils Relative: 1 %
Eosinophils Absolute: 0.2 10*3/uL (ref 0.0–0.5)
Eosinophils Relative: 4 %
HCT: 32.8 % — ABNORMAL LOW (ref 39.0–52.0)
Hemoglobin: 11 g/dL — ABNORMAL LOW (ref 13.0–17.0)
Immature Granulocytes: 0 %
Lymphocytes Relative: 31 %
Lymphs Abs: 1.3 10*3/uL (ref 0.7–4.0)
MCH: 34.8 pg — ABNORMAL HIGH (ref 26.0–34.0)
MCHC: 33.5 g/dL (ref 30.0–36.0)
MCV: 103.8 fL — ABNORMAL HIGH (ref 80.0–100.0)
Monocytes Absolute: 0.5 10*3/uL (ref 0.1–1.0)
Monocytes Relative: 13 %
Neutro Abs: 2.2 10*3/uL (ref 1.7–7.7)
Neutrophils Relative %: 51 %
Platelets: 214 10*3/uL (ref 150–400)
RBC: 3.16 MIL/uL — ABNORMAL LOW (ref 4.22–5.81)
RDW: 13.3 % (ref 11.5–15.5)
WBC: 4.2 10*3/uL (ref 4.0–10.5)
nRBC: 0 % (ref 0.0–0.2)

## 2022-04-14 MED ORDER — PALONOSETRON HCL INJECTION 0.25 MG/5ML
0.2500 mg | Freq: Once | INTRAVENOUS | Status: AC
  Administered 2022-04-14: 0.25 mg via INTRAVENOUS
  Filled 2022-04-14: qty 5

## 2022-04-14 MED ORDER — SODIUM CHLORIDE 0.9 % IV SOLN
500.0000 mg/m2/d | INTRAVENOUS | Status: DC
  Administered 2022-04-14: 5700 mg via INTRAVENOUS
  Filled 2022-04-14: qty 114

## 2022-04-14 MED ORDER — SODIUM CHLORIDE 0.9 % IV SOLN
10.0000 mg | Freq: Once | INTRAVENOUS | Status: AC
  Administered 2022-04-14: 10 mg via INTRAVENOUS
  Filled 2022-04-14: qty 1

## 2022-04-14 MED ORDER — MITOMYCIN CHEMO IV INJECTION 40 MG
12.0000 mg/m2 | Freq: Once | INTRAVENOUS | Status: AC
  Administered 2022-04-14: 27 mg via INTRAVENOUS
  Filled 2022-04-14: qty 54

## 2022-04-14 MED ORDER — SODIUM CHLORIDE 0.9% FLUSH
10.0000 mL | Freq: Once | INTRAVENOUS | Status: AC
  Administered 2022-04-14: 10 mL via INTRAVENOUS

## 2022-04-14 MED ORDER — SODIUM CHLORIDE 0.9 % IV SOLN: Freq: Once | INTRAVENOUS | Status: AC

## 2022-04-14 NOTE — Patient Instructions (Signed)
[name]  Discharge Instructions: Thank you for choosing [facility] to provide your oncology and hematology care.  If you have a lab appointment with the [facility] - please note that after April 8th, 2024, all labs will be drawn in the cancer center.  You do not have to check in or register with the main entrance as you have in the past but will complete your check-in in the cancer center.  Wear comfortable clothing and clothing appropriate for easy access to any Portacath or PICC line.   We strive to give you quality time with your provider. You may need to reschedule your appointment if you arrive late (15 or more minutes).  Arriving late affects you and other patients whose appointments are after yours.  Also, if you miss three or more appointments without notifying the office, you may be dismissed from the clinic at the provider's discretion.      For prescription refill requests, have your pharmacy contact our office and allow 72 hours for refills to be completed.    Today you received the following chemotherapy and/or immunotherapy agents mitomycin, fluorouracil   To help prevent nausea and vomiting after your treatment, we encourage you to take your nausea medication as directed.  BELOW ARE SYMPTOMS THAT SHOULD BE REPORTED IMMEDIATELY: *FEVER GREATER THAN 100.4 F (38 C) OR HIGHER *CHILLS OR SWEATING *NAUSEA AND VOMITING THAT IS NOT CONTROLLED WITH YOUR NAUSEA MEDICATION *UNUSUAL SHORTNESS OF BREATH *UNUSUAL BRUISING OR BLEEDING *URINARY PROBLEMS (pain or burning when urinating, or frequent urination) *BOWEL PROBLEMS (unusual diarrhea, constipation, pain near the anus) TENDERNESS IN MOUTH AND THROAT WITH OR WITHOUT PRESENCE OF ULCERS (sore throat, sores in mouth, or a toothache) UNUSUAL RASH, SWELLING OR PAIN  UNUSUAL VAGINAL DISCHARGE OR ITCHING   Items with * indicate a potential emergency and should be followed up as soon as possible or  go to the Emergency Department if any problems should occur.  Please show the CHEMOTHERAPY ALERT CARD or IMMUNOTHERAPY ALERT CARD at check-in to the Emergency Department and triage nurse.  Should you have questions after your visit or need to cancel or reschedule your appointment, please contact [facility] [phone]  and follow the prompts.  Office hours are 8:00 a.m. to 4:30 p.m. Monday - Friday. Please note that voicemails left after 4:00 p.m. may not be returned until the following business day.  We are closed weekends and major holidays. You have access to a nurse at all times for urgent questions. Please call the main number to the clinic [phone] and follow the prompts.  For any non-urgent questions, you may also contact your provider using MyChart. We now offer e-Visits for anyone 54 and older to request care online for non-urgent symptoms. For details visit [email or web address].   Also download the MyChart app! Go to the app store, search "MyChart", open the app, select [facility], and log in with your MyChart username and password.

## 2022-04-14 NOTE — Progress Notes (Signed)
Pharmacist Chemotherapy Monitoring - Initial Assessment    Anticipated start date: 04/14/2022   The following has been reviewed per standard work regarding the patient's treatment regimen: The patient's diagnosis, treatment plan and drug doses, and organ/hematologic function Lab orders and baseline tests specific to treatment regimen  The treatment plan start date, drug sequencing, and pre-medications Prior authorization status  Patient's documented medication list, including drug-drug interaction screen and prescriptions for anti-emetics and supportive care specific to the treatment regimen The drug concentrations, fluid compatibility, administration routes, and timing of the medications to be used The patient's access for treatment and lifetime cumulative dose history, if applicable  The patient's medication allergies and previous infusion related reactions, if applicable   Changes made to treatment plan:  N/A  Follow up needed:  N/A  Gretel Acre, PharmD PGY1 Pharmacy Resident 04/14/2022 11:10 AM

## 2022-04-14 NOTE — Progress Notes (Signed)
Patient has been examined by Dr. Katragadda. Vital signs and labs have been reviewed by MD - ANC, Creatinine, LFTs, hemoglobin, and platelets are within treatment parameters per M.D. - pt may proceed with treatment.  Primary RN and pharmacy notified.  

## 2022-04-14 NOTE — Patient Instructions (Signed)
[facility] at [facility] Discharge Instructions   You were seen and examined today by Dr. Katragadda.  He reviewed the results of your lab work which are normal/stable.   We will proceed with your treatment today.  Return as scheduled.    Thank you for choosing [facility] at [facility] to provide your oncology and hematology care.  To afford each patient quality time with our provider, please arrive at least 15 minutes before your scheduled appointment time.   If you have a lab appointment with the Cancer Center please come in thru the Main Entrance and check in at the main information desk.  You need to re-schedule your appointment should you arrive 10 or more minutes late.  We strive to give you quality time with our providers, and arriving late affects you and other patients whose appointments are after yours.  Also, if you no show three or more times for appointments you may be dismissed from the clinic at the providers discretion.     Again, thank you for choosing [facility].  Our hope is that these requests will decrease the amount of time that you wait before being seen by our physicians.       _____________________________________________________________  Should you have questions after your visit to [facility], please contact our office at [phone] and follow the prompts.  Our office hours are 8:00 a.m. and 4:30 p.m. Monday - Friday.  Please note that voicemails left after 4:00 p.m. may not be returned until the following business day.  We are closed weekends and major holidays.  You do have access to a nurse 24-7, just call the main number to the clinic [phone] and do not press any options, hold on the line and a nurse will answer the phone.    For prescription refill requests, have your pharmacy contact our office and allow 72 hours.    Due to Covid, you will need to wear a mask upon entering  the hospital. If you do not have a mask, a mask will be given to you at the Main Entrance upon arrival. For doctor visits, patients may have 1 support person age 18 or older with them. For treatment visits, patients can not have anyone with them due to social distancing guidelines and our immunocompromised population.      

## 2022-04-14 NOTE — Progress Notes (Unsigned)
Labs reviewed with MD today. Ok to treat per MD,for C1D1.   Ambulatory pump video viewed by patient. Information reviewed at time of administration.   Treatment given per orders. Patient tolerated it well without problems. Vitals stable and discharged home from clinic ambulatory. Follow up as scheduled.

## 2022-04-14 NOTE — Progress Notes (Signed)
Patients port flushed without difficulty.  Good blood return noted with no bruising or swelling noted at site.  Stable during access and blood draw.  Patient to remain accessed for treatment. 

## 2022-04-15 ENCOUNTER — Ambulatory Visit
Admission: RE | Admit: 2022-04-15 | Discharge: 2022-04-15 | Disposition: A | Discharge status: Home / Self Care | Payer: Medicare Other | Source: Ambulatory Visit | Attending: Radiation Oncology | Admitting: Radiation Oncology

## 2022-04-15 ENCOUNTER — Other Ambulatory Visit: Payer: Self-pay

## 2022-04-15 ENCOUNTER — Encounter: Payer: Self-pay | Admitting: Hematology

## 2022-04-15 DIAGNOSIS — D509 Iron deficiency anemia, unspecified: Secondary | ICD-10-CM | POA: Diagnosis not present

## 2022-04-15 DIAGNOSIS — C672 Malignant neoplasm of lateral wall of bladder: Secondary | ICD-10-CM | POA: Diagnosis not present

## 2022-04-15 DIAGNOSIS — Z79899 Other long term (current) drug therapy: Secondary | ICD-10-CM | POA: Diagnosis not present

## 2022-04-15 DIAGNOSIS — Z510 Encounter for antineoplastic radiation therapy: Secondary | ICD-10-CM | POA: Diagnosis not present

## 2022-04-15 DIAGNOSIS — Z5111 Encounter for antineoplastic chemotherapy: Secondary | ICD-10-CM | POA: Diagnosis not present

## 2022-04-15 LAB — RAD ONC ARIA SESSION SUMMARY
Course Elapsed Days: 3
Plan Fractions Treated to Date: 4
Plan Prescribed Dose Per Fraction: 1.8 Gy
Plan Total Fractions Prescribed: 11
Plan Total Prescribed Dose: 19.8 Gy
Reference Point Dosage Given to Date: 7.2 Gy
Reference Point Session Dosage Given: 1.8 Gy
Session Number: 4

## 2022-04-16 ENCOUNTER — Other Ambulatory Visit: Payer: Self-pay

## 2022-04-16 ENCOUNTER — Ambulatory Visit
Admission: RE | Admit: 2022-04-16 | Discharge: 2022-04-16 | Disposition: A | Discharge status: Home / Self Care | Payer: Medicare Other | Source: Ambulatory Visit | Attending: Radiation Oncology | Admitting: Radiation Oncology

## 2022-04-16 DIAGNOSIS — C672 Malignant neoplasm of lateral wall of bladder: Secondary | ICD-10-CM | POA: Diagnosis not present

## 2022-04-16 DIAGNOSIS — Z79899 Other long term (current) drug therapy: Secondary | ICD-10-CM | POA: Diagnosis not present

## 2022-04-16 DIAGNOSIS — D509 Iron deficiency anemia, unspecified: Secondary | ICD-10-CM | POA: Diagnosis not present

## 2022-04-16 DIAGNOSIS — Z510 Encounter for antineoplastic radiation therapy: Secondary | ICD-10-CM | POA: Diagnosis not present

## 2022-04-16 DIAGNOSIS — Z5111 Encounter for antineoplastic chemotherapy: Secondary | ICD-10-CM | POA: Diagnosis not present

## 2022-04-16 LAB — RAD ONC ARIA SESSION SUMMARY
Course Elapsed Days: 4
Plan Fractions Treated to Date: 5
Plan Prescribed Dose Per Fraction: 1.8 Gy
Plan Total Fractions Prescribed: 11
Plan Total Prescribed Dose: 19.8 Gy
Reference Point Dosage Given to Date: 9 Gy
Reference Point Session Dosage Given: 1.8 Gy
Session Number: 5

## 2022-04-19 ENCOUNTER — Ambulatory Visit
Admission: RE | Admit: 2022-04-19 | Discharge: 2022-04-19 | Disposition: A | Discharge status: Home / Self Care | Payer: Medicare Other | Source: Ambulatory Visit | Attending: Radiation Oncology | Admitting: Radiation Oncology

## 2022-04-19 ENCOUNTER — Inpatient Hospital Stay: Payer: Medicare Other | Admitting: Dietician

## 2022-04-19 ENCOUNTER — Other Ambulatory Visit: Payer: Self-pay

## 2022-04-19 ENCOUNTER — Inpatient Hospital Stay: Payer: Medicare Other

## 2022-04-19 VITALS — BP 125/54 | HR 63 | Temp 97.0°F | Resp 18

## 2022-04-19 DIAGNOSIS — D509 Iron deficiency anemia, unspecified: Secondary | ICD-10-CM | POA: Diagnosis not present

## 2022-04-19 DIAGNOSIS — C672 Malignant neoplasm of lateral wall of bladder: Secondary | ICD-10-CM | POA: Diagnosis not present

## 2022-04-19 DIAGNOSIS — Z510 Encounter for antineoplastic radiation therapy: Secondary | ICD-10-CM | POA: Diagnosis not present

## 2022-04-19 DIAGNOSIS — Z79899 Other long term (current) drug therapy: Secondary | ICD-10-CM | POA: Diagnosis not present

## 2022-04-19 DIAGNOSIS — Z5111 Encounter for antineoplastic chemotherapy: Secondary | ICD-10-CM | POA: Diagnosis not present

## 2022-04-19 DIAGNOSIS — C678 Malignant neoplasm of overlapping sites of bladder: Secondary | ICD-10-CM

## 2022-04-19 LAB — RAD ONC ARIA SESSION SUMMARY
Course Elapsed Days: 7
Plan Fractions Treated to Date: 6
Plan Prescribed Dose Per Fraction: 1.8 Gy
Plan Total Fractions Prescribed: 11
Plan Total Prescribed Dose: 19.8 Gy
Reference Point Dosage Given to Date: 10.8 Gy
Reference Point Session Dosage Given: 1.8 Gy
Session Number: 6

## 2022-04-19 MED ORDER — HEPARIN SOD (PORK) LOCK FLUSH 100 UNIT/ML IV SOLN
500.0000 [IU] | Freq: Once | INTRAVENOUS | Status: AC | PRN
  Administered 2022-04-19: 500 [IU]

## 2022-04-19 MED ORDER — SODIUM CHLORIDE 0.9% FLUSH
10.0000 mL | INTRAVENOUS | Status: DC | PRN
  Administered 2022-04-19: 10 mL

## 2022-04-19 NOTE — Progress Notes (Signed)
Patients port flushed without difficulty.  Good blood return noted with no bruising or swelling noted at site.  Home infusion 5FU pump disconnected.  Band aid applied.  VSS with discharge and left in satisfactory condition with no s/s of distress noted.   °

## 2022-04-19 NOTE — Patient Instructions (Signed)
[facility]  Discharge Instructions: Thank you for choosing [facility] to provide your oncology and hematology care.  If you have a lab appointment with the Cancer Center - please note that after April 8th, 2024, all labs will be drawn in the cancer center.  You do not have to check in or register with the main entrance as you have in the past but will complete your check-in in the cancer center.  Wear comfortable clothing and clothing appropriate for easy access to any Portacath or PICC line.   We strive to give you quality time with your provider. You may need to reschedule your appointment if you arrive late (15 or more minutes).  Arriving late affects you and other patients whose appointments are after yours.  Also, if you miss three or more appointments without notifying the office, you may be dismissed from the clinic at the provider's discretion.      For prescription refill requests, have your pharmacy contact our office and allow 72 hours for refills to be completed.  To help prevent nausea and vomiting after your treatment, we encourage you to take your nausea medication as directed.  BELOW ARE SYMPTOMS THAT SHOULD BE REPORTED IMMEDIATELY: *FEVER GREATER THAN 100.4 F (38 C) OR HIGHER *CHILLS OR SWEATING *NAUSEA AND VOMITING THAT IS NOT CONTROLLED WITH YOUR NAUSEA MEDICATION *UNUSUAL SHORTNESS OF BREATH *UNUSUAL BRUISING OR BLEEDING *URINARY PROBLEMS (pain or burning when urinating, or frequent urination) *BOWEL PROBLEMS (unusual diarrhea, constipation, pain near the anus) TENDERNESS IN MOUTH AND THROAT WITH OR WITHOUT PRESENCE OF ULCERS (sore throat, sores in mouth, or a toothache) UNUSUAL RASH, SWELLING OR PAIN  UNUSUAL VAGINAL DISCHARGE OR ITCHING   Items with * indicate a potential emergency and should be followed up as soon as possible or go to the Emergency Department if any problems should occur.  Please show the CHEMOTHERAPY ALERT CARD or  IMMUNOTHERAPY ALERT CARD at check-in to the Emergency Department and triage nurse.  Should you have questions after your visit or need to cancel or reschedule your appointment, please contact [facility] [phone]  and follow the prompts.  Office hours are 8:00 a.m. to 4:30 p.m. Monday - Friday. Please note that voicemails left after 4:00 p.m. may not be returned until the following business day.  We are closed weekends and major holidays. You have access to a nurse at all times for urgent questions. Please call the main number to the clinic [phone] and follow the prompts.  For any non-urgent questions, you may also contact your provider using MyChart. We now offer e-Visits for anyone 18 and older to request care online for non-urgent symptoms. For details visit [email or web address].   Also download the MyChart app! Go to the app store, search "MyChart", open the app, select [facility], and log in with your MyChart username and password.   

## 2022-04-19 NOTE — Progress Notes (Signed)
Nutrition Assessment   Reason for Assessment: New pt   ASSESSMENT: 77 year old male with bladder cancer. He is receiving concurrent chemoradiation with 5FU + mitomycin (start 4/3)  Met with patient in clinic during pump disconnect. He reports tolerating first chemotherapy well. Patient endorses a good appetite. He is eating a variety of foods with good sources of protein. Patient is drinking ~2 cups of black coffee and "good amount" of unsweetened lemonade. He does not drink water regularly. Patient denies nausea, vomiting, diarrhea. He reports mild constipation. This resolved with stool softener.   Nutrition Focused Physical Exam: deferred    Medications: reviewed    Labs: reviewed    Anthropometrics:   Height: 6'1" Weight: 219 lb  UBW: 219-221 BMI: 28.89    NUTRITION DIAGNOSIS: Food and nutrition related knowledge deficit related to newly diagnosed cancer as evidenced by no prior need for associated information     INTERVENTION:  Discussed importance of calorie and protein energy intake to maintain strength/weights during treatment Educated on importance of hydration. Patient will work to increase intake of water All questions answered Contact information given    MONITORING, EVALUATION, GOAL: Pt will tolerate increased calories and protein to minimize wt loss during treatment   Next Visit: To be scheduled as needed

## 2022-04-20 ENCOUNTER — Other Ambulatory Visit: Payer: Self-pay

## 2022-04-20 ENCOUNTER — Ambulatory Visit
Admission: RE | Admit: 2022-04-20 | Discharge: 2022-04-20 | Disposition: A | Discharge status: Home / Self Care | Payer: Medicare Other | Source: Ambulatory Visit | Attending: Radiation Oncology | Admitting: Radiation Oncology

## 2022-04-20 DIAGNOSIS — Z5111 Encounter for antineoplastic chemotherapy: Secondary | ICD-10-CM | POA: Diagnosis not present

## 2022-04-20 DIAGNOSIS — C672 Malignant neoplasm of lateral wall of bladder: Secondary | ICD-10-CM | POA: Diagnosis not present

## 2022-04-20 DIAGNOSIS — Z79899 Other long term (current) drug therapy: Secondary | ICD-10-CM | POA: Diagnosis not present

## 2022-04-20 DIAGNOSIS — D509 Iron deficiency anemia, unspecified: Secondary | ICD-10-CM | POA: Diagnosis not present

## 2022-04-20 DIAGNOSIS — Z510 Encounter for antineoplastic radiation therapy: Secondary | ICD-10-CM | POA: Diagnosis not present

## 2022-04-20 LAB — RAD ONC ARIA SESSION SUMMARY
Course Elapsed Days: 8
Plan Fractions Treated to Date: 7
Plan Prescribed Dose Per Fraction: 1.8 Gy
Plan Total Fractions Prescribed: 11
Plan Total Prescribed Dose: 19.8 Gy
Reference Point Dosage Given to Date: 12.6 Gy
Reference Point Session Dosage Given: 1.8 Gy
Session Number: 7

## 2022-04-20 NOTE — Progress Notes (Signed)
24 hour call back: Patient states he is doing fine per patient's words. Patient instructed to call clinic with any concern's or questions. Patient denies nausea, vomiting, diarrhea. Patient is eating and drinking without any difficulties.

## 2022-04-21 ENCOUNTER — Ambulatory Visit
Admission: RE | Admit: 2022-04-21 | Discharge: 2022-04-21 | Disposition: A | Discharge status: Home / Self Care | Payer: Medicare Other | Source: Ambulatory Visit | Attending: Radiation Oncology | Admitting: Radiation Oncology

## 2022-04-21 ENCOUNTER — Other Ambulatory Visit: Payer: Self-pay

## 2022-04-21 DIAGNOSIS — D509 Iron deficiency anemia, unspecified: Secondary | ICD-10-CM | POA: Diagnosis not present

## 2022-04-21 DIAGNOSIS — Z5111 Encounter for antineoplastic chemotherapy: Secondary | ICD-10-CM | POA: Diagnosis not present

## 2022-04-21 DIAGNOSIS — Z79899 Other long term (current) drug therapy: Secondary | ICD-10-CM | POA: Diagnosis not present

## 2022-04-21 DIAGNOSIS — Z510 Encounter for antineoplastic radiation therapy: Secondary | ICD-10-CM | POA: Diagnosis not present

## 2022-04-21 DIAGNOSIS — C672 Malignant neoplasm of lateral wall of bladder: Secondary | ICD-10-CM | POA: Diagnosis not present

## 2022-04-21 LAB — RAD ONC ARIA SESSION SUMMARY
Course Elapsed Days: 9
Plan Fractions Treated to Date: 8
Plan Prescribed Dose Per Fraction: 1.8 Gy
Plan Total Fractions Prescribed: 11
Plan Total Prescribed Dose: 19.8 Gy
Reference Point Dosage Given to Date: 14.4 Gy
Reference Point Session Dosage Given: 1.8 Gy
Session Number: 8

## 2022-04-22 ENCOUNTER — Other Ambulatory Visit: Payer: Self-pay

## 2022-04-22 ENCOUNTER — Ambulatory Visit
Admission: RE | Admit: 2022-04-22 | Discharge: 2022-04-22 | Disposition: A | Discharge status: Home / Self Care | Payer: Medicare Other | Source: Ambulatory Visit | Attending: Radiation Oncology | Admitting: Radiation Oncology

## 2022-04-22 DIAGNOSIS — C672 Malignant neoplasm of lateral wall of bladder: Secondary | ICD-10-CM | POA: Diagnosis not present

## 2022-04-22 DIAGNOSIS — Z510 Encounter for antineoplastic radiation therapy: Secondary | ICD-10-CM | POA: Diagnosis not present

## 2022-04-22 DIAGNOSIS — Z79899 Other long term (current) drug therapy: Secondary | ICD-10-CM | POA: Diagnosis not present

## 2022-04-22 DIAGNOSIS — D509 Iron deficiency anemia, unspecified: Secondary | ICD-10-CM | POA: Diagnosis not present

## 2022-04-22 DIAGNOSIS — Z5111 Encounter for antineoplastic chemotherapy: Secondary | ICD-10-CM | POA: Diagnosis not present

## 2022-04-22 LAB — RAD ONC ARIA SESSION SUMMARY
Course Elapsed Days: 10
Plan Fractions Treated to Date: 9
Plan Prescribed Dose Per Fraction: 1.8 Gy
Plan Total Fractions Prescribed: 11
Plan Total Prescribed Dose: 19.8 Gy
Reference Point Dosage Given to Date: 16.2 Gy
Reference Point Session Dosage Given: 1.8 Gy
Session Number: 9

## 2022-04-23 ENCOUNTER — Other Ambulatory Visit: Payer: Self-pay

## 2022-04-23 ENCOUNTER — Ambulatory Visit
Admission: RE | Admit: 2022-04-23 | Discharge: 2022-04-23 | Disposition: A | Discharge status: Home / Self Care | Payer: Medicare Other | Source: Ambulatory Visit | Attending: Radiation Oncology | Admitting: Radiation Oncology

## 2022-04-23 DIAGNOSIS — Z79899 Other long term (current) drug therapy: Secondary | ICD-10-CM | POA: Diagnosis not present

## 2022-04-23 DIAGNOSIS — D509 Iron deficiency anemia, unspecified: Secondary | ICD-10-CM | POA: Diagnosis not present

## 2022-04-23 DIAGNOSIS — C672 Malignant neoplasm of lateral wall of bladder: Secondary | ICD-10-CM | POA: Diagnosis not present

## 2022-04-23 DIAGNOSIS — Z510 Encounter for antineoplastic radiation therapy: Secondary | ICD-10-CM | POA: Diagnosis not present

## 2022-04-23 DIAGNOSIS — Z5111 Encounter for antineoplastic chemotherapy: Secondary | ICD-10-CM | POA: Diagnosis not present

## 2022-04-23 LAB — RAD ONC ARIA SESSION SUMMARY
Course Elapsed Days: 11
Plan Fractions Treated to Date: 10
Plan Prescribed Dose Per Fraction: 1.8 Gy
Plan Total Fractions Prescribed: 11
Plan Total Prescribed Dose: 19.8 Gy
Reference Point Dosage Given to Date: 18 Gy
Reference Point Session Dosage Given: 1.8 Gy
Session Number: 10

## 2022-04-26 ENCOUNTER — Other Ambulatory Visit: Payer: Self-pay

## 2022-04-26 ENCOUNTER — Ambulatory Visit: Payer: Medicare Other

## 2022-04-26 ENCOUNTER — Ambulatory Visit
Admission: RE | Admit: 2022-04-26 | Discharge: 2022-04-26 | Disposition: A | Discharge status: Home / Self Care | Payer: Medicare Other | Source: Ambulatory Visit | Attending: Radiation Oncology | Admitting: Radiation Oncology

## 2022-04-26 DIAGNOSIS — C672 Malignant neoplasm of lateral wall of bladder: Secondary | ICD-10-CM | POA: Diagnosis not present

## 2022-04-26 DIAGNOSIS — D509 Iron deficiency anemia, unspecified: Secondary | ICD-10-CM | POA: Diagnosis not present

## 2022-04-26 DIAGNOSIS — Z510 Encounter for antineoplastic radiation therapy: Secondary | ICD-10-CM | POA: Diagnosis not present

## 2022-04-26 DIAGNOSIS — Z5111 Encounter for antineoplastic chemotherapy: Secondary | ICD-10-CM | POA: Diagnosis not present

## 2022-04-26 DIAGNOSIS — Z79899 Other long term (current) drug therapy: Secondary | ICD-10-CM | POA: Diagnosis not present

## 2022-04-26 LAB — RAD ONC ARIA SESSION SUMMARY
Course Elapsed Days: 14
Plan Fractions Treated to Date: 11
Plan Prescribed Dose Per Fraction: 1.8 Gy
Plan Total Fractions Prescribed: 11
Plan Total Prescribed Dose: 19.8 Gy
Reference Point Dosage Given to Date: 19.8 Gy
Reference Point Session Dosage Given: 1.8 Gy
Session Number: 11

## 2022-04-27 ENCOUNTER — Ambulatory Visit
Admission: RE | Admit: 2022-04-27 | Discharge: 2022-04-27 | Disposition: A | Discharge status: Home / Self Care | Payer: Medicare Other | Source: Ambulatory Visit | Attending: Radiation Oncology | Admitting: Radiation Oncology

## 2022-04-27 ENCOUNTER — Ambulatory Visit: Payer: Medicare Other

## 2022-04-27 ENCOUNTER — Other Ambulatory Visit: Payer: Self-pay

## 2022-04-27 DIAGNOSIS — D509 Iron deficiency anemia, unspecified: Secondary | ICD-10-CM | POA: Diagnosis not present

## 2022-04-27 DIAGNOSIS — Z5111 Encounter for antineoplastic chemotherapy: Secondary | ICD-10-CM | POA: Diagnosis not present

## 2022-04-27 DIAGNOSIS — Z510 Encounter for antineoplastic radiation therapy: Secondary | ICD-10-CM | POA: Diagnosis not present

## 2022-04-27 DIAGNOSIS — Z79899 Other long term (current) drug therapy: Secondary | ICD-10-CM | POA: Diagnosis not present

## 2022-04-27 DIAGNOSIS — C672 Malignant neoplasm of lateral wall of bladder: Secondary | ICD-10-CM | POA: Diagnosis not present

## 2022-04-27 LAB — RAD ONC ARIA SESSION SUMMARY
Course Elapsed Days: 15
Plan Fractions Treated to Date: 1
Plan Prescribed Dose Per Fraction: 1.8 Gy
Plan Total Fractions Prescribed: 25
Plan Total Prescribed Dose: 45 Gy
Reference Point Dosage Given to Date: 1.8 Gy
Reference Point Session Dosage Given: 1.8 Gy
Session Number: 12

## 2022-04-27 NOTE — Progress Notes (Unsigned)
[facility] [address] [place] [address] Phone: [phone] Fax: [phone]  SYMPTOM MANAGEMENT CLINIC PROGRESS NOTE   RAHMERE FICCA [id number] 1945/07/21 77 y.o.  [name] is managed by Dr. Ellin Saba for T2 N0 multifocal bladder cancer.  Actively treated with chemotherapy/immunotherapy/hormonal therapy: YES  Current therapy: Mitomycin + fluorouracil  Last treated: Cycle #1, day #1 on 04/14/2022  INTERVAL HISTORY:  Chief Complaint: Chemotherapy follow-up & symptom management visit  [name] is managed by Dr. Ellin Saba for multifocal bladder cancer.  He received day #1 of cycle #1 fluorouracil and mitomycin on 04/14/2022.  His home infusion 5-FU pump was disconnected on 04/19/2022.  Since that time, he has been feeling fairly well.  He did have some fatigue, but this improved after he received his monthly B12 injection yesterday.  He reports that much of his fatigue is due to poor sleep quality (waking frequently at night to urinate).  He remains active by walking around the block at least once a day, but notes that he has had to stop and "catch his breath more frequently since receiving chemo.  He reports normal appetite and nutritional intake.  Weight today is 219 pounds, and stable.  He reports drinking about 60 ounces of water daily.  He had mild constipation relieved with Colace.  He denies any nausea, vomiting, or diarrhea.  He reports mouth sores that occurred a week ago, but improved with mouthwash.  He reports dyspnea on exertion, but denies any chest pain or peripheral edema.  Chronic neuropathy is stable on gabapentin.  He denies any new onset pain.  He denies any abnormal bruising, bleeding, or B symptoms.  No rash or skin changes.  ASSESSMENT & PLAN:  ## T2 N0 MUSCULAR INVASIVE BLADDER CANCER, MULTIFOCAL - Primary oncologist is Dr. Ellin Saba for multifocal bladder cancer. - Received day #1 of cycle #1  fluorouracil and mitomycin on 04/14/2022.  His home infusion 5-FU pump was disconnected on 04/19/2022. - He is undergoing XRT under the care of Dr. Kathrynn Running, started on 04/12/2022 - Labs today show baseline CKD, normal LFTs, normal electrolytes. - PLAN: Next appointment with Dr. Ellin Saba scheduled for 05/12/2022  # Normocytic to microcytic anemia: - Feraheme was given on 02/16/2022 and 02/23/2022.  # Chemotherapy-induced pancytopenia - CBC today (04/28/2022): WBC 3.1/normal differential.  Hgb 9.8/MCV 104.6.  Platelets 107.  PLAN SUMMARY: >> Next scheduled appointment with medical oncologist (Dr. Ellin Saba): 05/12/2022   REVIEW OF SYSTEMS:   Review of Systems  Constitutional:  Positive for fatigue. Negative for activity change, appetite change, chills, diaphoresis, fever and unexpected weight change.  HENT:  Negative for mouth sores, nosebleeds, sore throat and trouble swallowing.   Respiratory:  Positive for shortness of breath (with exertion). Negative for cough.   Cardiovascular:  Negative for chest pain, palpitations and leg swelling.  Gastrointestinal:  Negative for abdominal pain, blood in stool, constipation, diarrhea, nausea and vomiting.  Genitourinary:  Positive for difficulty urinating and frequency (nocturia). Negative for dysuria and hematuria.  Neurological:  Positive for dizziness. Negative for light-headedness, numbness and headaches.  Psychiatric/Behavioral:  Positive for sleep disturbance (due to nocturia). Negative for dysphoric mood. The patient is not nervous/anxious.    Past Medical History, Surgical history, Social history, and Family history were reviewed as documented elsewhere in chart, and were updated as appropriate.   OBJECTIVE:  Physical Exam:  There were no vitals taken for this visit. ECOG: 1  Physical Exam Constitutional:  Appearance: Normal appearance. He is obese.  Cardiovascular:     Heart sounds: Murmur heard.  Pulmonary:     Breath sounds: Normal  breath sounds.  Neurological:     General: No focal deficit present.     Mental Status: Mental status is at baseline.  Psychiatric:        Behavior: Behavior normal. Behavior is cooperative.    Lab Review:     Component Value Date/Time   NA 138 04/14/2022 0931   NA 145 (H) 11/11/2021 0000   K 3.8 04/14/2022 0931   CL 103 04/14/2022 0931   CO2 27 04/14/2022 0931   GLUCOSE 113 (H) 04/14/2022 0931   BUN 21 04/14/2022 0931   BUN 19 11/11/2021 0000   CREATININE 1.45 (H) 04/14/2022 0931   CREATININE 1.69 (H) 12/14/2021 1124   CALCIUM 8.9 04/14/2022 0931   PROT 7.2 04/14/2022 0931   PROT 6.9 10/06/2021 1542   ALBUMIN 4.0 04/14/2022 0931   ALBUMIN 4.5 10/06/2021 1542   AST 21 04/14/2022 0931   ALT 23 04/14/2022 0931   ALKPHOS 40 04/14/2022 0931   BILITOT 0.6 04/14/2022 0931   BILITOT 0.3 10/06/2021 1542   GFRNONAA 49 (L) 04/14/2022 0931   GFRAA >60 09/08/2016 1956       Component Value Date/Time   WBC 4.2 04/14/2022 0931   RBC 3.16 (L) 04/14/2022 0931   HGB 11.0 (L) 04/14/2022 0931   HGB 12.1 (L) 10/06/2021 1542   HCT 32.8 (L) 04/14/2022 0931   HCT 35.1 (L) 10/06/2021 1542   PLT 214 04/14/2022 0931   PLT 176 10/06/2021 1542   MCV 103.8 (H) 04/14/2022 0931   MCV 99 (H) 10/06/2021 1542   MCH 34.8 (H) 04/14/2022 0931   MCHC 33.5 04/14/2022 0931   RDW 13.3 04/14/2022 0931   RDW 12.7 10/06/2021 1542   LYMPHSABS 1.3 04/14/2022 0931   MONOABS 0.5 04/14/2022 0931   EOSABS 0.2 04/14/2022 0931   BASOSABS 0.0 04/14/2022 0931   -------------------------------  Imaging from last 24 hours (if applicable): Radiology interpretation: No results found.    WRAP UP:  All questions were answered. The patient knows to call the clinic with any problems, questions or concerns.  Medical decision making: Moderate  Time spent on visit: I spent 20 minutes counseling the patient face to face. The total time spent in the appointment was 30 minutes and more than 50% was on  counseling.  Carnella Guadalajara, PA-C  04/28/2022 12:33 PM

## 2022-04-28 ENCOUNTER — Ambulatory Visit
Admission: RE | Admit: 2022-04-28 | Discharge: 2022-04-28 | Disposition: A | Discharge status: Home / Self Care | Payer: Medicare Other | Source: Ambulatory Visit | Attending: Radiation Oncology | Admitting: Radiation Oncology

## 2022-04-28 ENCOUNTER — Other Ambulatory Visit: Payer: Self-pay

## 2022-04-28 ENCOUNTER — Inpatient Hospital Stay (HOSPITAL_BASED_OUTPATIENT_CLINIC_OR_DEPARTMENT_OTHER): Payer: Medicare Other | Admitting: Physician Assistant

## 2022-04-28 ENCOUNTER — Inpatient Hospital Stay: Payer: Medicare Other

## 2022-04-28 VITALS — BP 119/61 | HR 59 | Temp 96.7°F | Resp 20 | Wt 219.1 lb

## 2022-04-28 DIAGNOSIS — Z95828 Presence of other vascular implants and grafts: Secondary | ICD-10-CM

## 2022-04-28 DIAGNOSIS — D61810 Antineoplastic chemotherapy induced pancytopenia: Secondary | ICD-10-CM | POA: Diagnosis not present

## 2022-04-28 DIAGNOSIS — Z09 Encounter for follow-up examination after completed treatment for conditions other than malignant neoplasm: Secondary | ICD-10-CM

## 2022-04-28 DIAGNOSIS — T451X5A Adverse effect of antineoplastic and immunosuppressive drugs, initial encounter: Secondary | ICD-10-CM

## 2022-04-28 DIAGNOSIS — Z510 Encounter for antineoplastic radiation therapy: Secondary | ICD-10-CM | POA: Diagnosis not present

## 2022-04-28 DIAGNOSIS — Z5111 Encounter for antineoplastic chemotherapy: Secondary | ICD-10-CM | POA: Diagnosis not present

## 2022-04-28 DIAGNOSIS — C678 Malignant neoplasm of overlapping sites of bladder: Secondary | ICD-10-CM

## 2022-04-28 DIAGNOSIS — C672 Malignant neoplasm of lateral wall of bladder: Secondary | ICD-10-CM | POA: Diagnosis not present

## 2022-04-28 DIAGNOSIS — Z79899 Other long term (current) drug therapy: Secondary | ICD-10-CM | POA: Diagnosis not present

## 2022-04-28 DIAGNOSIS — D509 Iron deficiency anemia, unspecified: Secondary | ICD-10-CM | POA: Diagnosis not present

## 2022-04-28 LAB — RAD ONC ARIA SESSION SUMMARY
Course Elapsed Days: 16
Plan Fractions Treated to Date: 2
Plan Prescribed Dose Per Fraction: 1.8 Gy
Plan Total Fractions Prescribed: 25
Plan Total Prescribed Dose: 45 Gy
Reference Point Dosage Given to Date: 3.6 Gy
Reference Point Session Dosage Given: 1.8 Gy
Session Number: 13

## 2022-04-28 LAB — CBC WITH DIFFERENTIAL/PLATELET
Abs Immature Granulocytes: 0.01 10*3/uL (ref 0.00–0.07)
Basophils Absolute: 0 10*3/uL (ref 0.0–0.1)
Basophils Relative: 0 %
Eosinophils Absolute: 0.1 10*3/uL (ref 0.0–0.5)
Eosinophils Relative: 3 %
HCT: 29.5 % — ABNORMAL LOW (ref 39.0–52.0)
Hemoglobin: 9.8 g/dL — ABNORMAL LOW (ref 13.0–17.0)
Immature Granulocytes: 0 %
Lymphocytes Relative: 22 %
Lymphs Abs: 0.7 10*3/uL (ref 0.7–4.0)
MCH: 34.8 pg — ABNORMAL HIGH (ref 26.0–34.0)
MCHC: 33.2 g/dL (ref 30.0–36.0)
MCV: 104.6 fL — ABNORMAL HIGH (ref 80.0–100.0)
Monocytes Absolute: 0.4 10*3/uL (ref 0.1–1.0)
Monocytes Relative: 13 %
Neutro Abs: 1.9 10*3/uL (ref 1.7–7.7)
Neutrophils Relative %: 62 %
Platelets: 107 10*3/uL — ABNORMAL LOW (ref 150–400)
RBC: 2.82 MIL/uL — ABNORMAL LOW (ref 4.22–5.81)
RDW: 13.4 % (ref 11.5–15.5)
WBC: 3.1 10*3/uL — ABNORMAL LOW (ref 4.0–10.5)
nRBC: 0 % (ref 0.0–0.2)

## 2022-04-28 LAB — COMPREHENSIVE METABOLIC PANEL
ALT: 24 U/L (ref 0–44)
AST: 21 U/L (ref 15–41)
Albumin: 3.6 g/dL (ref 3.5–5.0)
Alkaline Phosphatase: 41 U/L (ref 38–126)
Anion gap: 7 (ref 5–15)
BUN: 22 mg/dL (ref 8–23)
CO2: 26 mmol/L (ref 22–32)
Calcium: 8.6 mg/dL — ABNORMAL LOW (ref 8.9–10.3)
Chloride: 105 mmol/L (ref 98–111)
Creatinine, Ser: 1.38 mg/dL — ABNORMAL HIGH (ref 0.61–1.24)
GFR, Estimated: 52 mL/min — ABNORMAL LOW (ref >60)
Glucose, Bld: 128 mg/dL — ABNORMAL HIGH (ref 70–99)
Potassium: 3.7 mmol/L (ref 3.5–5.1)
Sodium: 138 mmol/L (ref 135–145)
Total Bilirubin: 0.7 mg/dL (ref 0.3–1.2)
Total Protein: 6.6 g/dL (ref 6.5–8.1)

## 2022-04-28 LAB — MAGNESIUM: Magnesium: 2.2 mg/dL (ref 1.7–2.4)

## 2022-04-28 MED ORDER — SODIUM CHLORIDE 0.9% FLUSH
10.0000 mL | INTRAVENOUS | Status: DC | PRN
  Administered 2022-04-28: 10 mL via INTRAVENOUS

## 2022-04-28 MED ORDER — HEPARIN SOD (PORK) LOCK FLUSH 100 UNIT/ML IV SOLN
500.0000 [IU] | Freq: Once | INTRAVENOUS | Status: AC
  Administered 2022-04-28: 500 [IU] via INTRAVENOUS

## 2022-04-28 MED ORDER — SODIUM CHLORIDE 0.9% FLUSH
10.0000 mL | Freq: Once | INTRAVENOUS | Status: AC
  Administered 2022-04-28: 10 mL via INTRAVENOUS

## 2022-04-28 NOTE — Progress Notes (Signed)
No fluids needed today per Rojelio Brenner PA-C.

## 2022-04-28 NOTE — Patient Instructions (Signed)
[facility] **VISIT SUMMARY & IMPORTANT INSTRUCTIONS **   You were seen today by Rojelio Brenner PA-C for your chemotherapy follow-up visit.    CHEMOTHERAPY TREATMENT PLAN: You received mitomycin chemotherapy x 1 dose on 04/14/2022. You received fluorouracil (5-FU home infusion pump) from 04/14/2022 to 04/19/2022. You will see Dr. Ellin Saba on 05/12/2022. You will receive fluorouracil (5-FU home infusion pump) from 05/12/2022 to 05/17/2022. You will continue radiation therapy through Dr. Kathrynn Running.  TODAY's VISIT was to check labs and make sure you had tolerated your chemotherapy well. Your labs and vital signs look great! Continue using mouthwash "swish and spit" four times daily for your mouth sores. You do not need any IV fluids or medication changes after today's visit.   - - - - - - - - - - - - - - - - - -    Thank you for choosing [facility] at [facility] to provide your oncology and hematology care.  To afford each patient quality time with our provider, please arrive at least 15 minutes before your scheduled appointment time.   If you have a lab appointment with the Cancer Center please come in thru the Main Entrance and check in at the main information desk.  You need to re-schedule your appointment should you arrive 10 or more minutes late.  We strive to give you quality time with our providers, and arriving late affects you and other patients whose appointments are after yours.  Also, if you no show three or more times for appointments you may be dismissed from the clinic at the providers discretion.     Again, thank you for choosing [facility].  Our hope is that these requests will decrease the amount of time that you wait before being seen by our physicians.       _____________________________________________________________  Should you have questions after your visit to [facility], please contact  our office at [phone] and follow the prompts.  Our office hours are 8:00 a.m. and 4:30 p.m. Monday - Friday.  Please note that voicemails left after 4:00 p.m. may not be returned until the following business day.  We are closed weekends and major holidays.  You do have access to a nurse 24-7, just call the main number to the clinic [phone] and do not press any options, hold on the line and a nurse will answer the phone.    For prescription refill requests, have your pharmacy contact our office and allow 72 hours.

## 2022-04-29 ENCOUNTER — Ambulatory Visit
Admission: RE | Admit: 2022-04-29 | Discharge: 2022-04-29 | Disposition: A | Discharge status: Home / Self Care | Payer: Medicare Other | Source: Ambulatory Visit | Attending: Radiation Oncology | Admitting: Radiation Oncology

## 2022-04-29 ENCOUNTER — Other Ambulatory Visit: Payer: Self-pay

## 2022-04-29 DIAGNOSIS — Z5111 Encounter for antineoplastic chemotherapy: Secondary | ICD-10-CM | POA: Diagnosis not present

## 2022-04-29 DIAGNOSIS — D509 Iron deficiency anemia, unspecified: Secondary | ICD-10-CM | POA: Diagnosis not present

## 2022-04-29 DIAGNOSIS — Z79899 Other long term (current) drug therapy: Secondary | ICD-10-CM | POA: Diagnosis not present

## 2022-04-29 DIAGNOSIS — Z510 Encounter for antineoplastic radiation therapy: Secondary | ICD-10-CM | POA: Diagnosis not present

## 2022-04-29 DIAGNOSIS — C672 Malignant neoplasm of lateral wall of bladder: Secondary | ICD-10-CM | POA: Diagnosis not present

## 2022-04-29 LAB — RAD ONC ARIA SESSION SUMMARY
Course Elapsed Days: 17
Plan Fractions Treated to Date: 3
Plan Prescribed Dose Per Fraction: 1.8 Gy
Plan Total Fractions Prescribed: 25
Plan Total Prescribed Dose: 45 Gy
Reference Point Dosage Given to Date: 5.4 Gy
Reference Point Session Dosage Given: 1.8 Gy
Session Number: 14

## 2022-04-30 ENCOUNTER — Other Ambulatory Visit: Payer: Self-pay

## 2022-04-30 ENCOUNTER — Ambulatory Visit: Payer: Medicare Other

## 2022-04-30 ENCOUNTER — Ambulatory Visit
Admission: RE | Admit: 2022-04-30 | Discharge: 2022-04-30 | Disposition: A | Discharge status: Home / Self Care | Payer: Medicare Other | Source: Ambulatory Visit | Attending: Radiation Oncology | Admitting: Radiation Oncology

## 2022-04-30 DIAGNOSIS — Z510 Encounter for antineoplastic radiation therapy: Secondary | ICD-10-CM | POA: Diagnosis not present

## 2022-04-30 DIAGNOSIS — Z5111 Encounter for antineoplastic chemotherapy: Secondary | ICD-10-CM | POA: Diagnosis not present

## 2022-04-30 DIAGNOSIS — C672 Malignant neoplasm of lateral wall of bladder: Secondary | ICD-10-CM | POA: Diagnosis not present

## 2022-04-30 DIAGNOSIS — D509 Iron deficiency anemia, unspecified: Secondary | ICD-10-CM | POA: Diagnosis not present

## 2022-04-30 DIAGNOSIS — Z79899 Other long term (current) drug therapy: Secondary | ICD-10-CM | POA: Diagnosis not present

## 2022-04-30 LAB — RAD ONC ARIA SESSION SUMMARY
Course Elapsed Days: 18
Plan Fractions Treated to Date: 4
Plan Prescribed Dose Per Fraction: 1.8 Gy
Plan Total Fractions Prescribed: 25
Plan Total Prescribed Dose: 45 Gy
Reference Point Dosage Given to Date: 7.2 Gy
Reference Point Session Dosage Given: 1.8 Gy
Session Number: 15

## 2022-05-03 ENCOUNTER — Ambulatory Visit
Admission: RE | Admit: 2022-05-03 | Discharge: 2022-05-03 | Disposition: A | Discharge status: Home / Self Care | Payer: Medicare Other | Source: Ambulatory Visit | Attending: Radiation Oncology | Admitting: Radiation Oncology

## 2022-05-03 ENCOUNTER — Other Ambulatory Visit: Payer: Self-pay

## 2022-05-03 DIAGNOSIS — Z79899 Other long term (current) drug therapy: Secondary | ICD-10-CM | POA: Diagnosis not present

## 2022-05-03 DIAGNOSIS — Z5111 Encounter for antineoplastic chemotherapy: Secondary | ICD-10-CM | POA: Diagnosis not present

## 2022-05-03 DIAGNOSIS — C672 Malignant neoplasm of lateral wall of bladder: Secondary | ICD-10-CM | POA: Diagnosis not present

## 2022-05-03 DIAGNOSIS — D509 Iron deficiency anemia, unspecified: Secondary | ICD-10-CM | POA: Diagnosis not present

## 2022-05-03 DIAGNOSIS — Z510 Encounter for antineoplastic radiation therapy: Secondary | ICD-10-CM | POA: Diagnosis not present

## 2022-05-03 LAB — RAD ONC ARIA SESSION SUMMARY
Course Elapsed Days: 21
Plan Fractions Treated to Date: 5
Plan Prescribed Dose Per Fraction: 1.8 Gy
Plan Total Fractions Prescribed: 25
Plan Total Prescribed Dose: 45 Gy
Reference Point Dosage Given to Date: 9 Gy
Reference Point Session Dosage Given: 1.8 Gy
Session Number: 16

## 2022-05-04 ENCOUNTER — Ambulatory Visit
Admission: RE | Admit: 2022-05-04 | Discharge: 2022-05-04 | Disposition: A | Discharge status: Home / Self Care | Payer: Medicare Other | Source: Ambulatory Visit | Attending: Radiation Oncology | Admitting: Radiation Oncology

## 2022-05-04 ENCOUNTER — Other Ambulatory Visit: Payer: Self-pay

## 2022-05-04 DIAGNOSIS — C672 Malignant neoplasm of lateral wall of bladder: Secondary | ICD-10-CM | POA: Diagnosis not present

## 2022-05-04 DIAGNOSIS — D509 Iron deficiency anemia, unspecified: Secondary | ICD-10-CM | POA: Diagnosis not present

## 2022-05-04 DIAGNOSIS — Z510 Encounter for antineoplastic radiation therapy: Secondary | ICD-10-CM | POA: Diagnosis not present

## 2022-05-04 DIAGNOSIS — Z79899 Other long term (current) drug therapy: Secondary | ICD-10-CM | POA: Diagnosis not present

## 2022-05-04 DIAGNOSIS — Z5111 Encounter for antineoplastic chemotherapy: Secondary | ICD-10-CM | POA: Diagnosis not present

## 2022-05-04 LAB — RAD ONC ARIA SESSION SUMMARY
Course Elapsed Days: 22
Plan Fractions Treated to Date: 6
Plan Prescribed Dose Per Fraction: 1.8 Gy
Plan Total Fractions Prescribed: 25
Plan Total Prescribed Dose: 45 Gy
Reference Point Dosage Given to Date: 10.8 Gy
Reference Point Session Dosage Given: 1.8 Gy
Session Number: 17

## 2022-05-05 ENCOUNTER — Other Ambulatory Visit: Payer: Self-pay

## 2022-05-05 ENCOUNTER — Ambulatory Visit: Payer: Medicare Other | Admitting: Urology

## 2022-05-05 ENCOUNTER — Ambulatory Visit
Admission: RE | Admit: 2022-05-05 | Discharge: 2022-05-05 | Disposition: A | Discharge status: Home / Self Care | Payer: Medicare Other | Source: Ambulatory Visit | Attending: Radiation Oncology | Admitting: Radiation Oncology

## 2022-05-05 ENCOUNTER — Encounter: Payer: Self-pay | Admitting: Urology

## 2022-05-05 VITALS — BP 153/70 | HR 74

## 2022-05-05 DIAGNOSIS — C672 Malignant neoplasm of lateral wall of bladder: Secondary | ICD-10-CM | POA: Diagnosis not present

## 2022-05-05 DIAGNOSIS — R3915 Urgency of urination: Secondary | ICD-10-CM | POA: Diagnosis not present

## 2022-05-05 DIAGNOSIS — D509 Iron deficiency anemia, unspecified: Secondary | ICD-10-CM | POA: Diagnosis not present

## 2022-05-05 DIAGNOSIS — Z510 Encounter for antineoplastic radiation therapy: Secondary | ICD-10-CM | POA: Diagnosis not present

## 2022-05-05 DIAGNOSIS — C679 Malignant neoplasm of bladder, unspecified: Secondary | ICD-10-CM

## 2022-05-05 DIAGNOSIS — Z79899 Other long term (current) drug therapy: Secondary | ICD-10-CM | POA: Diagnosis not present

## 2022-05-05 DIAGNOSIS — Z5111 Encounter for antineoplastic chemotherapy: Secondary | ICD-10-CM | POA: Diagnosis not present

## 2022-05-05 LAB — URINALYSIS, ROUTINE W REFLEX MICROSCOPIC
Bilirubin, UA: NEGATIVE
Glucose, UA: NEGATIVE
Ketones, UA: NEGATIVE
Nitrite, UA: NEGATIVE
Specific Gravity, UA: 1.025 (ref 1.005–1.030)
Urobilinogen, Ur: 0.2 mg/dL (ref 0.2–1.0)
pH, UA: 5 (ref 5.0–7.5)

## 2022-05-05 LAB — MICROSCOPIC EXAMINATION

## 2022-05-05 LAB — RAD ONC ARIA SESSION SUMMARY
Course Elapsed Days: 23
Plan Fractions Treated to Date: 7
Plan Prescribed Dose Per Fraction: 1.8 Gy
Plan Total Fractions Prescribed: 25
Plan Total Prescribed Dose: 45 Gy
Reference Point Dosage Given to Date: 12.6 Gy
Reference Point Session Dosage Given: 1.8 Gy
Session Number: 18

## 2022-05-05 MED ORDER — MIRABEGRON ER 25 MG PO TB24
25.0000 mg | ORAL_TABLET | Freq: Every day | ORAL | 0 refills | Status: DC
Start: 2022-05-05 — End: 2022-10-29

## 2022-05-05 NOTE — Progress Notes (Signed)
05/05/2022 1:48 PM   [name] June 17, 1945 [id number]  Referring provider: Assunta Found, MD [address] [place],  [place] [address]  Followup bladder cancer   HPI: Mr Strey is a 77yo here for followup for bladder cancer. He is undergoing radiation therapy for muscle invasive bladder cancer. He has worsening urinary urgency and frequency. No straining to urinate. No urinary hesitancy. No hematuria or dysuria   PMH: Past Medical History:  Diagnosis Date   Anginal pain (HCC)    Arthritis    "all over"   Bladder tumor    Chronic lower back pain    Coronary artery disease    a. s/p CABG x 2 (LIMA->LAD, RIMA->RCA);  b. s/p multiple PCI's to Ramus;  c. 08/2011 Cath/PCI: LM 70% into ramus with 70-80% there->treated wtih 3.5x18 Xience Xpedition DES, LCX  nonobs, RCA occluded.  RIMA & LIMA patent, EF 55-65%   DJD (degenerative joint disease)    GERD (gastroesophageal reflux disease)    History of gout    Hyperlipidemia    Hypertension    Hypothyroidism    Neuropathy    OSA on CPAP    Prostate cancer [facility])     Surgical History: Past Surgical History:  Procedure Laterality Date   ABDOMINAL HERNIA REPAIR     BIOPSY  11/26/2015   Procedure: BIOPSY;  Surgeon: Malissa Hippo, MD;  [facility];  Service: Endoscopy;;  gastric esophagus   BIOPSY  01/20/2021   Procedure: BIOPSY;  Surgeon: Dolores Frame, MD;  [facility];  Service: Gastroenterology;;   BIOPSY  10/28/2021   Procedure: BIOPSY;  Surgeon: Dolores Frame, MD;  [facility];  Service: Gastroenterology;;   BLADDER TUMOR EXCISION     BROW LIFT Bilateral 04/27/2019   Procedure: BILATERAL BLEPHAROPLASTY;  Surgeon: Fabio Pierce, MD;  Location: [facility];  Service: Ophthalmology;  Laterality: Bilateral;   CARDIAC CATHETERIZATION N/A 04/10/2015   Procedure: Left Heart Cath and Cors/Grafts Angiography;  Surgeon: [name], MD;  Location: [facility] INVASIVE CV  LAB;  Service: Cardiovascular;  Laterality: N/A;   CARDIAC CATHETERIZATION N/A 04/10/2015   Procedure: Coronary Stent Intervention;  Surgeon: [name], MD;  Location: [facility] INVASIVE CV LAB;  Service: Cardiovascular;  Laterality: N/A;   CATARACT EXTRACTION W/PHACO Right 06/23/2015   Procedure: CATARACT EXTRACTION PHACO AND INTRAOCULAR LENS PLACEMENT RIGHT EYE; CDE:  7.08;  Surgeon: Gemma Payor, MD;  Location: [facility];  Service: Ophthalmology;  Laterality: Right;   CATARACT EXTRACTION W/PHACO Left 08/25/2015   Procedure: CATARACT EXTRACTION PHACO AND INTRAOCULAR LENS PLACEMENT LEFT EYE; CDE:  8.18;  Surgeon: Gemma Payor, MD;  Location: [facility];  Service: Ophthalmology;  Laterality: Left;   COLONOSCOPY N/A 12/28/2012   Procedure: COLONOSCOPY;  Surgeon: Malissa Hippo, MD;  [facility];  Service: Endoscopy;  Laterality: N/A;  830 rescheduled   COLONOSCOPY WITH PROPOFOL N/A 01/20/2021   Procedure: COLONOSCOPY WITH PROPOFOL;  Surgeon: Dolores Frame, MD;  [facility];  Service: Gastroenterology;  Laterality: N/A;  11:05   CORONARY ANGIOPLASTY WITH STENT PLACEMENT  2013; 04/10/2015   "this makes me a total of 7" (04/10/2015)   CORONARY ARTERY BYPASS GRAFT  1997   with (LIMA)   ESOPHAGEAL DILATION N/A 11/26/2015   Procedure: ESOPHAGEAL DILATION;  Surgeon: Malissa Hippo, MD;  [facility];  Service: Endoscopy;  Laterality: N/A;   ESOPHAGEAL DILATION N/A 05/23/2017   Procedure: ESOPHAGEAL DILATION;  Surgeon: Malissa Hippo,  MD;  [facility];  Service: Endoscopy;  Laterality: N/A;   ESOPHAGEAL DILATION N/A 06/16/2017   Procedure: ESOPHAGEAL DILATION;  Surgeon: Malissa Hippo, MD;  [facility];  Service: Endoscopy;  Laterality: N/A;   ESOPHAGEAL DILATION  10/28/2021   Procedure: ESOPHAGEAL DILATION;  Surgeon: Dolores Frame, MD;  [facility];  Service: Gastroenterology;;   ESOPHAGOGASTRODUODENOSCOPY N/A 11/26/2015    Procedure: ESOPHAGOGASTRODUODENOSCOPY (EGD);  Surgeon: Malissa Hippo, MD;  [facility];  Service: Endoscopy;  Laterality: N/A;  1:25   ESOPHAGOGASTRODUODENOSCOPY N/A 05/23/2017   Procedure: ESOPHAGOGASTRODUODENOSCOPY (EGD);  Surgeon: Malissa Hippo, MD;  [facility];  Service: Endoscopy;  Laterality: N/A;  1:15   ESOPHAGOGASTRODUODENOSCOPY N/A 06/16/2017   Procedure: ESOPHAGOGASTRODUODENOSCOPY (EGD);  Surgeon: Malissa Hippo, MD;  [facility];  Service: Endoscopy;  Laterality: N/A;  1240   ESOPHAGOGASTRODUODENOSCOPY (EGD) WITH PROPOFOL N/A 01/20/2021   Procedure: ESOPHAGOGASTRODUODENOSCOPY (EGD) WITH PROPOFOL;  Surgeon: Dolores Frame, MD;  [facility];  Service: Gastroenterology;  Laterality: N/A;   ESOPHAGOGASTRODUODENOSCOPY (EGD) WITH PROPOFOL N/A 10/28/2021   Procedure: ESOPHAGOGASTRODUODENOSCOPY (EGD) WITH PROPOFOL;  Surgeon: Dolores Frame, MD;  [facility];  Service: Gastroenterology;  Laterality: N/A;  215 ASA 2, pt will arrive at 10:45   HERNIA REPAIR Left    IR IMAGING GUIDED PORT INSERTION  02/24/2022   KNEE CARTILAGE SURGERY Bilateral    LEFT HEART CATH AND CORS/GRAFTS ANGIOGRAPHY N/A 09/08/2016   Procedure: LEFT HEART CATH AND CORS/GRAFTS ANGIOGRAPHY;  Surgeon: [place]az[place], Peter M, MD;  Location: [facility] INVASIVE CV LAB;  Service: Cardiovascular;  Laterality: N/A;   LEFT HEART CATHETERIZATION WITH CORONARY ANGIOGRAM N/A 09/08/2011   Procedure: LEFT HEART CATHETERIZATION WITH CORONARY ANGIOGRAM;  Surgeon: [name], MD;  Location: [facility] CATH LAB;  Service: Cardiovascular;  Laterality: N/A;   PERCUTANEOUS CORONARY STENT INTERVENTION (PCI-S) Right 09/08/2011   Procedure: PERCUTANEOUS CORONARY STENT INTERVENTION (PCI-S);  Surgeon: [name], MD;  Location: [facility] CATH LAB;  Service: Cardiovascular;  Laterality: Right;   POLYPECTOMY  01/20/2021   Procedure: POLYPECTOMY;  Surgeon: Dolores Frame, MD;   [facility];  Service: Gastroenterology;;   ROBOT ASSISTED LAPAROSCOPIC RADICAL PROSTATECTOMY      Home Medications:  Allergies as of 05/05/2022       Reactions   Codeine Nausea Only   Pollen Extract Other (See Comments)   Seasonal        Medication List        Accurate as of May 05, 2022  1:48 PM. If you have any questions, ask your nurse or doctor.          amLODipine 10 MG tablet Commonly known as: NORVASC Take 10 mg by mouth in the morning.   aspirin EC 81 MG tablet Take 81 mg by mouth every evening.   atorvastatin 40 MG tablet Commonly known as: LIPITOR Take 0.5 tablets (20 mg total) by mouth every evening.   carvedilol 6.25 MG tablet Commonly known as: COREG Take 1 tablet (6.25 mg total) by mouth 2 (two) times daily.   cetirizine 10 MG tablet Commonly known as: ZYRTEC Take 10 mg by mouth every evening.   cholecalciferol 25 MCG (1000 UNIT) tablet Commonly known as: VITAMIN D3 Take 1,000 Units by mouth every evening.   clopidogrel 75 MG tablet Commonly known as: PLAVIX Take 1 tablet (75 mg total) by mouth in the morning.   Coenzyme Q10 300 MG Caps Take  300 mg by mouth every evening.   cyanocobalamin 1000 MCG/ML injection Commonly known as: VITAMIN B12 Inject 1,000 mcg into the muscle See admin instructions. Inject (1000 mcg) intramuscularly once monthly as needed for B-12 deficiency.   fenofibrate 160 MG tablet Take 1 tablet (160 mg total) by mouth daily. What changed: when to take this   FeroSul 325 (65 FE) MG tablet Generic drug: ferrous sulfate TAKE 1 TABLET(325 MG) BY MOUTH TWICE DAILY WITH A MEAL   Fish Oil 1000 MG Caps Take 1 capsule by mouth in the morning.   gabapentin 600 MG tablet Commonly known as: NEURONTIN Take 600 mg by mouth 3 (three) times daily.   GLUCOSAMINE-CHONDROITIN PO Take 1 tablet by mouth every evening.   isosorbide mononitrate 60 MG 24 hr tablet Commonly known as: IMDUR Take 1.5 tablets (90 mg  total) by mouth daily. What changed: when to take this   levothyroxine 150 MCG tablet Commonly known as: SYNTHROID Take 150 mcg by mouth daily before breakfast.   lidocaine-prilocaine cream Commonly known as: EMLA Apply to affected area once   Lubricant Eye Drops 0.4-0.3 % Soln Generic drug: Polyethyl Glycol-Propyl Glycol Place 1-2 drops into both eyes 3 (three) times daily as needed (dry/irritated eyes.).   meclizine 25 MG tablet Commonly known as: ANTIVERT Take 1 tablet (25 mg total) by mouth 2 (two) times daily as needed for dizziness. What changed:  how much to take when to take this   mirabegron ER 25 MG Tb24 tablet Commonly known as: MYRBETRIQ Take 1 tablet (25 mg total) by mouth daily. Started by: Wilkie Aye, MD   nitroGLYCERIN 0.4 MG SL tablet Commonly known as: NITROSTAT Place 1 tablet (0.4 mg total) under the tongue every 5 (five) minutes as needed for chest pain.   prochlorperazine 10 MG tablet Commonly known as: COMPAZINE Take 1 tablet (10 mg total) by mouth every 6 (six) hours as needed for nausea or vomiting.   psyllium 58.6 % powder Commonly known as: METAMUCIL Take 1 packet by mouth 2 (two) times a week.   traMADol 50 MG tablet Commonly known as: ULTRAM Take 50 mg by mouth 3 (three) times daily.        Allergies:  Allergies  Allergen Reactions   Codeine Nausea Only   Pollen Extract Other (See Comments)    Seasonal    Family History: Family History  Problem Relation Age of Onset   Cancer Father 63       died   Stroke Mother 45       died    Social History:  reports that he has quit smoking. His smoking use included cigars. He has been exposed to tobacco smoke. He has never used smokeless tobacco. He reports current alcohol use. He reports that he does not use drugs.  ROS: All other review of systems were reviewed and are negative except what is noted above in HPI  Physical Exam: BP (!) 153/70   Pulse 74   Constitutional:   Alert and oriented, No acute distress. HEENT: [place] AT, moist mucus membranes.  Trachea midline, no masses. Cardiovascular: No clubbing, cyanosis, or edema. Respiratory: Normal respiratory effort, no increased work of breathing. GI: Abdomen is soft, nontender, nondistended, no abdominal masses GU: No CVA tenderness.  Lymph: No cervical or inguinal lymphadenopathy. Skin: No rashes, bruises or suspicious lesions. Neurologic: Grossly intact, no focal deficits, moving all 4 extremities. Psychiatric: Normal mood and affect.  Laboratory Data: Lab Results  Component Value Date  WBC 3.1 (L) 04/28/2022   HGB 9.8 (L) 04/28/2022   HCT 29.5 (L) 04/28/2022   MCV 104.6 (H) 04/28/2022   PLT 107 (L) 04/28/2022    Lab Results  Component Value Date   CREATININE 1.38 (H) 04/28/2022    No results found for: "PSA"  No results found for: "TESTOSTERONE"  No results found for: "HGBA1C"  Urinalysis    Component Value Date/Time   COLORURINE RED (A) 06/17/2020 0112   APPEARANCEUR Cloudy (A) 03/22/2022 1509   LABSPEC  06/17/2020 0112    TEST NOT REPORTED DUE TO COLOR INTERFERENCE OF URINE PIGMENT   PHURINE  06/17/2020 0112    TEST NOT REPORTED DUE TO COLOR INTERFERENCE OF URINE PIGMENT   GLUCOSEU Negative 03/22/2022 1509   HGBUR (A) 06/17/2020 0112    TEST NOT REPORTED DUE TO COLOR INTERFERENCE OF URINE PIGMENT   BILIRUBINUR Negative 03/22/2022 1509   KETONESUR (A) 06/17/2020 0112    TEST NOT REPORTED DUE TO COLOR INTERFERENCE OF URINE PIGMENT   PROTEINUR 2+ (A) 03/22/2022 1509   PROTEINUR (A) 06/17/2020 0112    TEST NOT REPORTED DUE TO COLOR INTERFERENCE OF URINE PIGMENT   NITRITE Negative 03/22/2022 1509   NITRITE (A) 06/17/2020 0112    TEST NOT REPORTED DUE TO COLOR INTERFERENCE OF URINE PIGMENT   LEUKOCYTESUR Negative 03/22/2022 1509   LEUKOCYTESUR (A) 06/17/2020 0112    TEST NOT REPORTED DUE TO COLOR INTERFERENCE OF URINE PIGMENT    Lab Results  Component Value Date   LABMICR See  below: 03/22/2022   WBCUA 0-5 03/22/2022   LABEPIT 0-10 03/22/2022   BACTERIA None seen 03/22/2022    Pertinent Imaging:  No results found for this or any previous visit.  No results found for this or any previous visit.  No results found for this or any previous visit.  No results found for this or any previous visit.  No results found for this or any previous visit.  No valid procedures specified. No results found for this or any previous visit.  No results found for this or any previous visit.   Assessment & Plan:    1. Malignant neoplasm of urinary bladder, unspecified site -followup 3 months for cystoscopy - Urinalysis, Routine w reflex microscopic  2. Urinary urgency -we will trial mirabegron 25mg  daily   No follow-ups on file.  Wilkie Aye, MD  [facility]

## 2022-05-05 NOTE — Patient Instructions (Signed)

## 2022-05-06 ENCOUNTER — Encounter: Payer: Self-pay | Admitting: *Deleted

## 2022-05-06 ENCOUNTER — Ambulatory Visit
Admission: RE | Admit: 2022-05-06 | Discharge: 2022-05-06 | Disposition: A | Discharge status: Home / Self Care | Payer: Medicare Other | Source: Ambulatory Visit | Attending: Radiation Oncology | Admitting: Radiation Oncology

## 2022-05-06 ENCOUNTER — Other Ambulatory Visit: Payer: Self-pay

## 2022-05-06 ENCOUNTER — Ambulatory Visit: Payer: Self-pay | Admitting: *Deleted

## 2022-05-06 DIAGNOSIS — Z79899 Other long term (current) drug therapy: Secondary | ICD-10-CM | POA: Diagnosis not present

## 2022-05-06 DIAGNOSIS — Z5111 Encounter for antineoplastic chemotherapy: Secondary | ICD-10-CM | POA: Diagnosis not present

## 2022-05-06 DIAGNOSIS — D509 Iron deficiency anemia, unspecified: Secondary | ICD-10-CM | POA: Diagnosis not present

## 2022-05-06 DIAGNOSIS — Z510 Encounter for antineoplastic radiation therapy: Secondary | ICD-10-CM | POA: Diagnosis not present

## 2022-05-06 DIAGNOSIS — C672 Malignant neoplasm of lateral wall of bladder: Secondary | ICD-10-CM | POA: Diagnosis not present

## 2022-05-06 LAB — RAD ONC ARIA SESSION SUMMARY
Course Elapsed Days: 24
Plan Fractions Treated to Date: 8
Plan Prescribed Dose Per Fraction: 1.8 Gy
Plan Total Fractions Prescribed: 25
Plan Total Prescribed Dose: 45 Gy
Reference Point Dosage Given to Date: 14.4 Gy
Reference Point Session Dosage Given: 1.8 Gy
Session Number: 19

## 2022-05-06 NOTE — Patient Outreach (Signed)
  Care Coordination   Follow Up Visit Note   05/06/2022 Name: [name] MRN: [id number] DOB: September 17, 1945  [name] is a 77 y.o. year old male who sees Steve Found, MD for primary care. I  spoke with patient's daughter, [name], by telephone today.  What matters to the patients health and wellness today?  Ongoing bladder cancer treatment and managing side effects    Goals Addressed             This Visit's Progress    Care Coordination Services r/t Bladder Cancer   On track    Care Coordination Goals: Patient will keep all scheduled medical appointments Patient will reach out to provider with any new or worsening symptoms or side effects Urinary frequency/urgency Some diarrhea Patient will take medication as prescribed Started on Myrbetriq yesterday for urinary frequency/urgency Patient will remain physically active and rest as needed Patient will eat a balanced diet and work with nutritionist Patient will drink plenty of fluids and include electrolyte replacement fluids if diarrhea persists Patient will reach out to RN Care Coordinator [phone] with any care coordination or resource needs        SDOH assessments and interventions completed:  Yes  SDOH Interventions Today    Flowsheet Row Most Recent Value  SDOH Interventions   Transportation Interventions Intervention Not Indicated  Financial Strain Interventions Intervention Not Indicated        Care Coordination Interventions:  Yes, provided  Interventions Today    Flowsheet Row Most Recent Value  Chronic Disease   Chronic disease during today's visit Other  [bladder cancer]  General Interventions   General Interventions Discussed/Reviewed General Interventions Discussed, General Interventions Reviewed, Doctor Visits  Doctor Visits Discussed/Reviewed Doctor Visits Discussed, Specialist, Doctor Visits Reviewed  PCP/Specialist Visits Compliance with follow-up visit  Exercise Interventions    Exercise Discussed/Reviewed Physical Activity  Physical Activity Discussed/Reviewed Physical Activity Reviewed, Physical Activity Discussed  Education Interventions   Education Provided Provided Education  Provided Verbal Education On Nutrition, When to see the doctor  Nutrition Interventions   Nutrition Discussed/Reviewed Fluid intake, Nutrition Reviewed  [drink swith electroytes when having diarrhea]  Pharmacy Interventions   Pharmacy Dicussed/Reviewed Medications and their functions  [myrbetriq for urinary frequency/urgency]       Follow up plan: Follow up call scheduled for 06/04/22    Encounter Outcome:  Pt. Visit Completed   [name], BSN, RN-BC RN Care Coordinator [facility]  [facility] Direct Dial: [phone] Main #: [phone]

## 2022-05-07 ENCOUNTER — Other Ambulatory Visit: Payer: Self-pay

## 2022-05-07 ENCOUNTER — Ambulatory Visit
Admission: RE | Admit: 2022-05-07 | Discharge: 2022-05-07 | Disposition: A | Discharge status: Home / Self Care | Payer: Medicare Other | Source: Ambulatory Visit | Attending: Radiation Oncology | Admitting: Radiation Oncology

## 2022-05-07 DIAGNOSIS — D509 Iron deficiency anemia, unspecified: Secondary | ICD-10-CM | POA: Diagnosis not present

## 2022-05-07 DIAGNOSIS — Z79899 Other long term (current) drug therapy: Secondary | ICD-10-CM | POA: Diagnosis not present

## 2022-05-07 DIAGNOSIS — C672 Malignant neoplasm of lateral wall of bladder: Secondary | ICD-10-CM | POA: Diagnosis not present

## 2022-05-07 DIAGNOSIS — Z510 Encounter for antineoplastic radiation therapy: Secondary | ICD-10-CM | POA: Diagnosis not present

## 2022-05-07 DIAGNOSIS — Z5111 Encounter for antineoplastic chemotherapy: Secondary | ICD-10-CM | POA: Diagnosis not present

## 2022-05-07 LAB — RAD ONC ARIA SESSION SUMMARY
Course Elapsed Days: 25
Plan Fractions Treated to Date: 9
Plan Prescribed Dose Per Fraction: 1.8 Gy
Plan Total Fractions Prescribed: 25
Plan Total Prescribed Dose: 45 Gy
Reference Point Dosage Given to Date: 16.2 Gy
Reference Point Session Dosage Given: 1.8 Gy
Session Number: 20

## 2022-05-10 ENCOUNTER — Other Ambulatory Visit: Payer: Self-pay

## 2022-05-10 ENCOUNTER — Ambulatory Visit
Admission: RE | Admit: 2022-05-10 | Discharge: 2022-05-10 | Disposition: A | Discharge status: Home / Self Care | Payer: Medicare Other | Source: Ambulatory Visit | Attending: Radiation Oncology | Admitting: Radiation Oncology

## 2022-05-10 DIAGNOSIS — C672 Malignant neoplasm of lateral wall of bladder: Secondary | ICD-10-CM | POA: Diagnosis not present

## 2022-05-10 DIAGNOSIS — Z79899 Other long term (current) drug therapy: Secondary | ICD-10-CM | POA: Diagnosis not present

## 2022-05-10 DIAGNOSIS — Z510 Encounter for antineoplastic radiation therapy: Secondary | ICD-10-CM | POA: Diagnosis not present

## 2022-05-10 DIAGNOSIS — D509 Iron deficiency anemia, unspecified: Secondary | ICD-10-CM | POA: Diagnosis not present

## 2022-05-10 DIAGNOSIS — Z5111 Encounter for antineoplastic chemotherapy: Secondary | ICD-10-CM | POA: Diagnosis not present

## 2022-05-10 LAB — RAD ONC ARIA SESSION SUMMARY
Course Elapsed Days: 28
Plan Fractions Treated to Date: 10
Plan Prescribed Dose Per Fraction: 1.8 Gy
Plan Total Fractions Prescribed: 25
Plan Total Prescribed Dose: 45 Gy
Reference Point Dosage Given to Date: 18 Gy
Reference Point Session Dosage Given: 1.8 Gy
Session Number: 21

## 2022-05-11 ENCOUNTER — Other Ambulatory Visit: Payer: Self-pay

## 2022-05-11 ENCOUNTER — Ambulatory Visit
Admission: RE | Admit: 2022-05-11 | Discharge: 2022-05-11 | Disposition: A | Discharge status: Home / Self Care | Payer: Medicare Other | Source: Ambulatory Visit | Attending: Radiation Oncology | Admitting: Radiation Oncology

## 2022-05-11 DIAGNOSIS — Z510 Encounter for antineoplastic radiation therapy: Secondary | ICD-10-CM | POA: Diagnosis not present

## 2022-05-11 DIAGNOSIS — Z79899 Other long term (current) drug therapy: Secondary | ICD-10-CM | POA: Diagnosis not present

## 2022-05-11 DIAGNOSIS — D509 Iron deficiency anemia, unspecified: Secondary | ICD-10-CM | POA: Diagnosis not present

## 2022-05-11 DIAGNOSIS — C672 Malignant neoplasm of lateral wall of bladder: Secondary | ICD-10-CM | POA: Diagnosis not present

## 2022-05-11 DIAGNOSIS — E119 Type 2 diabetes mellitus without complications: Secondary | ICD-10-CM | POA: Diagnosis not present

## 2022-05-11 DIAGNOSIS — I10 Essential (primary) hypertension: Secondary | ICD-10-CM | POA: Diagnosis not present

## 2022-05-11 DIAGNOSIS — Z5111 Encounter for antineoplastic chemotherapy: Secondary | ICD-10-CM | POA: Diagnosis not present

## 2022-05-11 LAB — RAD ONC ARIA SESSION SUMMARY
Course Elapsed Days: 29
Plan Fractions Treated to Date: 11
Plan Prescribed Dose Per Fraction: 1.8 Gy
Plan Total Fractions Prescribed: 25
Plan Total Prescribed Dose: 45 Gy
Reference Point Dosage Given to Date: 19.8 Gy
Reference Point Session Dosage Given: 1.8 Gy
Session Number: 22

## 2022-05-11 NOTE — Progress Notes (Signed)
[facility] [address] [place] [address]    Clinic Day:  05/12/2022  Referring physician: Assunta Found, MD  Patient Care Team: Assunta Found, MD as PCP - General (Family Medicine) [name], MD as PCP - Cardiology (Cardiology) Gwenith Daily, RN as [facility] Care Management Doreatha Massed, MD as Medical Oncologist (Medical Oncology) Therese Sarah, RN as Oncology Nurse Navigator (Medical Oncology)   ASSESSMENT & PLAN:   Assessment: 1.  T2 N0 bladder cancer (multifocal): - 12/16/2014: Initial diagnosis, malignant neoplasm of lateral wall of bladder - 03/14/2018: TURBT (pathology-invasive papillary urothelial carcinoma, high-grade, tumor involves lamina propria, muscularis propria present, not involved by tumor) - 05/04/2018 - 06/13/2018: Initial BCG induction course - 08/22/2018: Bladder biopsy HG Ta, multifocal - 08/29/2018 - 10/01/2018: Intravesicular valrubicin - 02/20/2019: Bladder biopsy-benign - 09/15/2019: TURBT- HG Ta, muscle present - 10/24/2019 - 11/28/2019: Completion of BCG induction course x 6 - 05/14/2020: TURBT-HG Ta, muscle present - 07/02/2020 - 08/20/2020: Intravesical gemcitabine/docetaxel - 03/10/2021: TURBT-HG Ta + CIS, muscle present - 04/29/2021 - 06/03/2021: Reinduction intravesical gemcitabine/docetaxel - 07/06/2021: Cystoscopy without any visible tumors, cytology with high-grade urothelial carcinoma - 12/30/2021: Progression with lesions throughout the bladder, muscle invasive at left lateral wall - He has chronic neuropathy, CKD which precludes him from cisplatin-based neoadjuvant chemotherapy followed by cystectomy. - He met with radiation oncology at [facility].  Due to multifocal nature of bladder cancer, they recommended him to go for surgery.  He preferred trimodality therapy. - Cystoscopy and TURBT (03/24/2022): At [facility] with specimens taken from right posterior wall bladder tumor, left anterior dome bladder tumor  and left lateral wall scar.  Pathology shows urothelial carcinoma in situ with no evidence of invasion in all 3 specimens.  Muscularis propria present, uninvolved by malignancy. - XRT started on 04/12/2022, cycle 1 of 5-FU and mitomycin on 04/14/2022   2.  Social/family history: - He lives at home by himself.  He is seen today with his daughter.  He is independent of ADLs and IADLs.  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  He dances 1-2 times per week.  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  Quit smoking 50 years ago. - Father had prostate cancer.  Maternal grandmother had cancer, type unknown to the patient.    Plan: 1.  T2 N0 multifocal bladder cancer: - He is tolerating radiation therapy very well. - After chemotherapy 2 weeks ago, he developed lower lip blisters which improved.  He has mild tiredness. - Labs today: Normal LFTs.  Creatinine elevated at 1.38 and stable.  Mild leukopenia and moderate thrombocytopenia and macrocytic anemia stable. - Recommend proceeding with 5-FU infusion pump for 5 days starting today. - He has 11 more doses of radiation. - RTC 3 weeks for follow-up with repeat labs.   2.  Normocytic to microcytic anemia: - Feraheme was given on 02/16/2022 on 02/23/2022. - Hemoglobin is 9.6, up from myelosuppression.  Will closely monitor.    Orders Placed This Encounter  Procedures   CBC with Differential    Standing Status:   Future    Standing Expiration Date:   05/12/2023   Comprehensive metabolic panel    Standing Status:   Future    Standing Expiration Date:   05/12/2023   Magnesium    Standing Status:   Future    Standing Expiration Date:   05/12/2023   Iron and TIBC ([facility] ONLY)    Standing  Status:   Future    Standing Expiration Date:   05/12/2023   Ferritin    Standing Status:   Future    Standing Expiration Date:   05/12/2023      I,Katie Daubenspeck,acting as a scribe for Doreatha Massed, MD.,have  documented all relevant documentation on the behalf of Doreatha Massed, MD,as directed by  Doreatha Massed, MD while in the presence of Doreatha Massed, MD.   I, Doreatha Massed MD, have reviewed the above documentation for accuracy and completeness, and I agree with the above.   Doreatha Massed, MD   5/1/20246:10 PM  CHIEF COMPLAINT:   Diagnosis: T2 N0 bladder cancer    Cancer Staging  Bladder cancer [facility]) Staging form: Urinary Bladder, AJCC 8th Edition - Clinical stage from 01/29/2022: Stage II (cT2, cN0, cM0) - Unsigned    Prior Therapy: none  Current Therapy:  Trimodality therapy    HISTORY OF PRESENT ILLNESS:   Oncology History  Bladder cancer (HCC)  01/29/2022 Initial Diagnosis   Bladder cancer (HCC)   04/14/2022 -  Chemotherapy   Patient is on Treatment Plan : BLADDER Mitomycin D1 + 5FU D1-5, 21-25 + XRT        INTERVAL HISTORY:   [name] is a 77 y.o. male presenting to clinic today for follow up of T2 N0 bladder cancer. He was last seen by me on 04/14/22.  Today, he states that he is doing well overall. His appetite level is at 100%. His energy level is at 50%.  PAST MEDICAL HISTORY:   Past Medical History: Past Medical History:  Diagnosis Date   Anginal pain (HCC)    Arthritis    "all over"   Bladder tumor    Chronic lower back pain    Coronary artery disease    a. s/p CABG x 2 (LIMA->LAD, RIMA->RCA);  b. s/p multiple PCI's to Ramus;  c. 08/2011 Cath/PCI: LM 70% into ramus with 70-80% there->treated wtih 3.5x18 Xience Xpedition DES, LCX  nonobs, RCA occluded.  RIMA & LIMA patent, EF 55-65%   DJD (degenerative joint disease)    GERD (gastroesophageal reflux disease)    History of gout    Hyperlipidemia    Hypertension    Hypothyroidism    Neuropathy    OSA on CPAP    Prostate cancer [facility])     Surgical History: Past Surgical History:  Procedure Laterality Date   ABDOMINAL HERNIA REPAIR     BIOPSY  11/26/2015   Procedure:  BIOPSY;  Surgeon: Malissa Hippo, MD;  [facility];  Service: Endoscopy;;  gastric esophagus   BIOPSY  01/20/2021   Procedure: BIOPSY;  Surgeon: Dolores Frame, MD;  [facility];  Service: Gastroenterology;;   BIOPSY  10/28/2021   Procedure: BIOPSY;  Surgeon: Dolores Frame, MD;  [facility];  Service: Gastroenterology;;   BLADDER TUMOR EXCISION     BROW LIFT Bilateral 04/27/2019   Procedure: BILATERAL BLEPHAROPLASTY;  Surgeon: Fabio Pierce, MD;  Location: [facility];  Service: Ophthalmology;  Laterality: Bilateral;   CARDIAC CATHETERIZATION N/A 04/10/2015   Procedure: Left Heart Cath and Cors/Grafts Angiography;  Surgeon: [name], MD;  Location: [facility] INVASIVE CV LAB;  Service: Cardiovascular;  Laterality: N/A;   CARDIAC CATHETERIZATION N/A 04/10/2015   Procedure: Coronary Stent Intervention;  Surgeon: [name], MD;  Location: [facility] INVASIVE CV LAB;  Service: Cardiovascular;  Laterality: N/A;   CATARACT EXTRACTION W/PHACO Right 06/23/2015   Procedure: CATARACT EXTRACTION PHACO AND INTRAOCULAR LENS  PLACEMENT RIGHT EYE; CDE:  7.08;  Surgeon: Gemma Payor, MD;  Location: [facility];  Service: Ophthalmology;  Laterality: Right;   CATARACT EXTRACTION W/PHACO Left 08/25/2015   Procedure: CATARACT EXTRACTION PHACO AND INTRAOCULAR LENS PLACEMENT LEFT EYE; CDE:  8.18;  Surgeon: Gemma Payor, MD;  Location: [facility];  Service: Ophthalmology;  Laterality: Left;   COLONOSCOPY N/A 12/28/2012   Procedure: COLONOSCOPY;  Surgeon: Malissa Hippo, MD;  [facility];  Service: Endoscopy;  Laterality: N/A;  830 rescheduled   COLONOSCOPY WITH PROPOFOL N/A 01/20/2021   Procedure: COLONOSCOPY WITH PROPOFOL;  Surgeon: Dolores Frame, MD;  [facility];  Service: Gastroenterology;  Laterality: N/A;  11:05   CORONARY ANGIOPLASTY WITH STENT PLACEMENT  2013; 04/10/2015   "this makes me a total of 7" (04/10/2015)   CORONARY ARTERY BYPASS GRAFT   1997   with (LIMA)   ESOPHAGEAL DILATION N/A 11/26/2015   Procedure: ESOPHAGEAL DILATION;  Surgeon: Malissa Hippo, MD;  [facility];  Service: Endoscopy;  Laterality: N/A;   ESOPHAGEAL DILATION N/A 05/23/2017   Procedure: ESOPHAGEAL DILATION;  Surgeon: Malissa Hippo, MD;  [facility];  Service: Endoscopy;  Laterality: N/A;   ESOPHAGEAL DILATION N/A 06/16/2017   Procedure: ESOPHAGEAL DILATION;  Surgeon: Malissa Hippo, MD;  [facility];  Service: Endoscopy;  Laterality: N/A;   ESOPHAGEAL DILATION  10/28/2021   Procedure: ESOPHAGEAL DILATION;  Surgeon: Dolores Frame, MD;  [facility];  Service: Gastroenterology;;   ESOPHAGOGASTRODUODENOSCOPY N/A 11/26/2015   Procedure: ESOPHAGOGASTRODUODENOSCOPY (EGD);  Surgeon: Malissa Hippo, MD;  [facility];  Service: Endoscopy;  Laterality: N/A;  1:25   ESOPHAGOGASTRODUODENOSCOPY N/A 05/23/2017   Procedure: ESOPHAGOGASTRODUODENOSCOPY (EGD);  Surgeon: Malissa Hippo, MD;  [facility];  Service: Endoscopy;  Laterality: N/A;  1:15   ESOPHAGOGASTRODUODENOSCOPY N/A 06/16/2017   Procedure: ESOPHAGOGASTRODUODENOSCOPY (EGD);  Surgeon: Malissa Hippo, MD;  [facility];  Service: Endoscopy;  Laterality: N/A;  1240   ESOPHAGOGASTRODUODENOSCOPY (EGD) WITH PROPOFOL N/A 01/20/2021   Procedure: ESOPHAGOGASTRODUODENOSCOPY (EGD) WITH PROPOFOL;  Surgeon: Dolores Frame, MD;  [facility];  Service: Gastroenterology;  Laterality: N/A;   ESOPHAGOGASTRODUODENOSCOPY (EGD) WITH PROPOFOL N/A 10/28/2021   Procedure: ESOPHAGOGASTRODUODENOSCOPY (EGD) WITH PROPOFOL;  Surgeon: Dolores Frame, MD;  [facility];  Service: Gastroenterology;  Laterality: N/A;  215 ASA 2, pt will arrive at 10:45   HERNIA REPAIR Left    IR IMAGING GUIDED PORT INSERTION  02/24/2022   KNEE CARTILAGE SURGERY Bilateral    LEFT HEART CATH AND CORS/GRAFTS ANGIOGRAPHY N/A  09/08/2016   Procedure: LEFT HEART CATH AND CORS/GRAFTS ANGIOGRAPHY;  Surgeon: Swaziland, Peter M, MD;  Location: [facility] INVASIVE CV LAB;  Service: Cardiovascular;  Laterality: N/A;   LEFT HEART CATHETERIZATION WITH CORONARY ANGIOGRAM N/A 09/08/2011   Procedure: LEFT HEART CATHETERIZATION WITH CORONARY ANGIOGRAM;  Surgeon: [name], MD;  Location: [facility] CATH LAB;  Service: Cardiovascular;  Laterality: N/A;   PERCUTANEOUS CORONARY STENT INTERVENTION (PCI-S) Right 09/08/2011   Procedure: PERCUTANEOUS CORONARY STENT INTERVENTION (PCI-S);  Surgeon: [name], MD;  Location: [facility] CATH LAB;  Service: Cardiovascular;  Laterality: Right;   POLYPECTOMY  01/20/2021   Procedure: POLYPECTOMY;  Surgeon: Dolores Frame, MD;  [facility];  Service: Gastroenterology;;   ROBOT ASSISTED LAPAROSCOPIC RADICAL PROSTATECTOMY      Social History: Social History   Socioeconomic History   Marital status: Widowed  Spouse name: Not on file   Number of children: Not on file   Years of education: Not on file   Highest education level: Not on file  Occupational History   Occupation: semi-retired  Tobacco Use   Smoking status: Former    Types: Cigars    Passive exposure: Past   Smokeless tobacco: Never   Tobacco comments:    "quit smoking in ~ 1978"  Vaping Use   Vaping Use: Never used  Substance and Sexual Activity   Alcohol use: Yes    Comment: occasionally    Drug use: No   Sexual activity: Not Currently  Other Topics Concern   Not on file  Social History Narrative   Not on file   Social Determinants of Health   Financial Resource Strain: Low Risk  (05/06/2022)   Overall Financial Resource Strain (CARDIA)    Difficulty of Paying Living Expenses: Not hard at all  Food Insecurity: No Food Insecurity (02/04/2022)   Hunger Vital Sign    Worried About Running Out of Food in the Last Year: Never true    Ran Out of Food in the Last Year: Never true  Transportation Needs: No  Transportation Needs (05/06/2022)   PRAPARE - Administrator, Civil Service (Medical): No    Lack of Transportation (Non-Medical): No  Physical Activity: Not on file  Stress: Not on file  Social Connections: Not on file  Intimate Partner Violence: Not At Risk (02/04/2022)   Humiliation, Afraid, Rape, and Kick questionnaire    Fear of Current or Ex-Partner: No    Emotionally Abused: No    Physically Abused: No    Sexually Abused: No    Family History: Family History  Problem Relation Age of Onset   Cancer Father 43       died   Stroke Mother 14       died    Current Medications:  Current Outpatient Medications:    amLODipine (NORVASC) 10 MG tablet, Take 10 mg by mouth in the morning., Disp: , Rfl:    aspirin EC 81 MG tablet, Take 81 mg by mouth every evening., Disp: 30 tablet, Rfl:    atorvastatin (LIPITOR) 40 MG tablet, Take 0.5 tablets (20 mg total) by mouth every evening., Disp: 45 tablet, Rfl: 3   carvedilol (COREG) 6.25 MG tablet, Take 1 tablet (6.25 mg total) by mouth 2 (two) times daily., Disp: 180 tablet, Rfl: 3   cetirizine (ZYRTEC) 10 MG tablet, Take 10 mg by mouth every evening., Disp: , Rfl:    cholecalciferol (VITAMIN D3) 25 MCG (1000 UNIT) tablet, Take 1,000 Units by mouth every evening., Disp: , Rfl:    clopidogrel (PLAVIX) 75 MG tablet, Take 1 tablet (75 mg total) by mouth in the morning., Disp: 90 tablet, Rfl: 3   Coenzyme Q10 300 MG CAPS, Take 300 mg by mouth every evening., Disp: , Rfl:    cyanocobalamin (,VITAMIN B-12,) 1000 MCG/ML injection, Inject 1,000 mcg into the muscle See admin instructions. Inject (1000 mcg) intramuscularly once monthly as needed for B-12 deficiency., Disp: , Rfl: 2   fenofibrate 160 MG tablet, Take 1 tablet (160 mg total) by mouth daily. (Patient taking differently: Take 160 mg by mouth every evening.), Disp: 30 tablet, Rfl: 11   FEROSUL 325 (65 Fe) MG tablet, TAKE 1 TABLET(325 MG) BY MOUTH TWICE DAILY WITH A MEAL, Disp: 180  tablet, Rfl: 3   gabapentin (NEURONTIN) 600 MG tablet, Take 600 mg by mouth 3 (  three) times daily., Disp: , Rfl: 3   GLUCOSAMINE-CHONDROITIN PO, Take 1 tablet by mouth every evening., Disp: , Rfl:    isosorbide mononitrate (IMDUR) 60 MG 24 hr tablet, Take 1.5 tablets (90 mg total) by mouth daily. (Patient taking differently: Take 90 mg by mouth every evening.), Disp: 135 tablet, Rfl: 3   levothyroxine (SYNTHROID, LEVOTHROID) 150 MCG tablet, Take 150 mcg by mouth daily before breakfast., Disp: , Rfl:    meclizine (ANTIVERT) 25 MG tablet, Take 1 tablet (25 mg total) by mouth 2 (two) times daily as needed for dizziness. (Patient taking differently: Take 12.5 mg by mouth at bedtime as needed for dizziness.), Disp: 30 tablet, Rfl: 0   mirabegron ER (MYRBETRIQ) 25 MG TB24 tablet, Take 1 tablet (25 mg total) by mouth daily., Disp: 30 tablet, Rfl: 0   nitroGLYCERIN (NITROSTAT) 0.4 MG SL tablet, Place 1 tablet (0.4 mg total) under the tongue every 5 (five) minutes as needed for chest pain., Disp: 25 tablet, Rfl: 11   Polyethyl Glycol-Propyl Glycol (LUBRICANT EYE DROPS) 0.4-0.3 % SOLN, Place 1-2 drops into both eyes 3 (three) times daily as needed (dry/irritated eyes.)., Disp: , Rfl:    traMADol (ULTRAM) 50 MG tablet, Take 50 mg by mouth 3 (three) times daily., Disp: , Rfl: 1   lidocaine-prilocaine (EMLA) cream, Apply to affected area once, Disp: 30 g, Rfl: 3   prochlorperazine (COMPAZINE) 10 MG tablet, Take 1 tablet (10 mg total) by mouth every 6 (six) hours as needed for nausea or vomiting., Disp: 30 tablet, Rfl: 1   Allergies: Allergies  Allergen Reactions   Codeine Nausea Only   Pollen Extract Other (See Comments)    Seasonal    REVIEW OF SYSTEMS:   Review of Systems  Constitutional:  Negative for chills, fatigue and fever.  HENT:   Negative for lump/mass, mouth sores, nosebleeds, sore throat and trouble swallowing.   Eyes:  Negative for eye problems.  Respiratory:  Positive for shortness of  breath. Negative for cough.   Cardiovascular:  Negative for chest pain, leg swelling and palpitations.  Gastrointestinal:  Negative for abdominal pain, constipation, diarrhea, nausea and vomiting.  Genitourinary:  Positive for frequency. Negative for bladder incontinence, difficulty urinating, dysuria, hematuria and nocturia.   Musculoskeletal:  Negative for arthralgias, back pain, flank pain, myalgias and neck pain.  Skin:  Negative for itching and rash.  Neurological:  Positive for dizziness and numbness. Negative for headaches.  Hematological:  Does not bruise/bleed easily.  Psychiatric/Behavioral:  Positive for sleep disturbance. Negative for depression and suicidal ideas. The patient is not nervous/anxious.   All other systems reviewed and are negative.    VITALS:   There were no vitals taken for this visit.  Wt Readings from Last 3 Encounters:  05/12/22 221 lb 12.8 oz (100.6 kg)  05/12/22 217 lb (98.4 kg)  04/28/22 219 lb 1.6 oz (99.4 kg)    There is no height or weight on file to calculate BMI.  Performance status (ECOG): 1 - Symptomatic but completely ambulatory  PHYSICAL EXAM:   Physical Exam Vitals and nursing note reviewed. Exam conducted with a chaperone present.  Constitutional:      Appearance: Normal appearance.  Cardiovascular:     Rate and Rhythm: Normal rate and regular rhythm.     Pulses: Normal pulses.     Heart sounds: Normal heart sounds.  Pulmonary:     Effort: Pulmonary effort is normal.     Breath sounds: Normal breath sounds.  Abdominal:  Palpations: Abdomen is soft. There is no hepatomegaly, splenomegaly or mass.     Tenderness: There is no abdominal tenderness.  Musculoskeletal:     Right lower leg: No edema.     Left lower leg: No edema.  Lymphadenopathy:     Cervical: No cervical adenopathy.     Right cervical: No superficial, deep or posterior cervical adenopathy.    Left cervical: No superficial, deep or posterior cervical adenopathy.      Upper Body:     Right upper body: No supraclavicular or axillary adenopathy.     Left upper body: No supraclavicular or axillary adenopathy.  Neurological:     General: No focal deficit present.     Mental Status: He is alert and oriented to person, place, and time.  Psychiatric:        Mood and Affect: Mood normal.        Behavior: Behavior normal.     LABS:      Latest Ref Rng & Units 05/12/2022    9:21 AM 04/28/2022    9:45 AM 04/14/2022    9:31 AM  CBC  WBC 4.0 - 10.5 K/uL 3.0  3.1  4.2   Hemoglobin 13.0 - 17.0 g/dL 9.6  9.8  40.9   Hematocrit 39.0 - 52.0 % 28.6  29.5  32.8   Platelets 150 - 400 K/uL 97  107  214       Latest Ref Rng & Units 05/12/2022    9:21 AM 04/28/2022    9:45 AM 04/14/2022    9:31 AM  CMP  Glucose 70 - 99 mg/dL 811  914  782   BUN 8 - 23 mg/dL 22  22  21    Creatinine 0.61 - 1.24 mg/dL 9.56  2.13  0.86   Sodium 135 - 145 mmol/L 138  138  138   Potassium 3.5 - 5.1 mmol/L 3.7  3.7  3.8   Chloride 98 - 111 mmol/L 105  105  103   CO2 22 - 32 mmol/L 27  26  27    Calcium 8.9 - 10.3 mg/dL 8.8  8.6  8.9   Total Protein 6.5 - 8.1 g/dL 6.5  6.6  7.2   Total Bilirubin 0.3 - 1.2 mg/dL 0.7  0.7  0.6   Alkaline Phos 38 - 126 U/L 39  41  40   AST 15 - 41 U/L 21  21  21    ALT 0 - 44 U/L 21  24  23       No results found for: "CEA1", "CEA" / No results found for: "CEA1", "CEA" No results found for: "PSA1" No results found for: "VHQ469" No results found for: "CAN125"  No results found for: "TOTALPROTELP", "ALBUMINELP", "A1GS", "A2GS", "BETS", "BETA2SER", "GAMS", "MSPIKE", "SPEI" Lab Results  Component Value Date   TIBC 398 02/04/2022   TIBC 360 12/14/2021   TIBC 347 10/28/2021   FERRITIN 93 02/04/2022   FERRITIN 79 12/14/2021   FERRITIN 59 10/28/2021   IRONPCTSAT 32 02/04/2022   IRONPCTSAT 36 12/14/2021   IRONPCTSAT 41 (H) 10/28/2021   No results found for: "LDH"   STUDIES:   No results found.

## 2022-05-12 ENCOUNTER — Ambulatory Visit
Admission: RE | Admit: 2022-05-12 | Discharge: 2022-05-12 | Disposition: A | Discharge status: Home / Self Care | Payer: Medicare Other | Source: Ambulatory Visit | Attending: Radiation Oncology | Admitting: Radiation Oncology

## 2022-05-12 ENCOUNTER — Encounter: Payer: Self-pay | Admitting: Cardiovascular Disease

## 2022-05-12 ENCOUNTER — Other Ambulatory Visit: Payer: Self-pay

## 2022-05-12 ENCOUNTER — Inpatient Hospital Stay: Payer: Medicare Other

## 2022-05-12 ENCOUNTER — Inpatient Hospital Stay (HOSPITAL_BASED_OUTPATIENT_CLINIC_OR_DEPARTMENT_OTHER): Payer: Medicare Other | Admitting: Hematology

## 2022-05-12 ENCOUNTER — Ambulatory Visit: Payer: Medicare Other | Attending: Cardiovascular Disease | Admitting: Cardiovascular Disease

## 2022-05-12 VITALS — BP 130/70 | HR 63 | Ht 73.0 in | Wt 221.8 lb

## 2022-05-12 VITALS — BP 135/63 | HR 59 | Temp 97.2°F | Resp 16 | Wt 217.0 lb

## 2022-05-12 DIAGNOSIS — I25119 Atherosclerotic heart disease of native coronary artery with unspecified angina pectoris: Secondary | ICD-10-CM | POA: Diagnosis not present

## 2022-05-12 DIAGNOSIS — Z79899 Other long term (current) drug therapy: Secondary | ICD-10-CM | POA: Insufficient documentation

## 2022-05-12 DIAGNOSIS — Z809 Family history of malignant neoplasm, unspecified: Secondary | ICD-10-CM | POA: Insufficient documentation

## 2022-05-12 DIAGNOSIS — C678 Malignant neoplasm of overlapping sites of bladder: Secondary | ICD-10-CM | POA: Diagnosis not present

## 2022-05-12 DIAGNOSIS — C672 Malignant neoplasm of lateral wall of bladder: Secondary | ICD-10-CM | POA: Insufficient documentation

## 2022-05-12 DIAGNOSIS — I10 Essential (primary) hypertension: Secondary | ICD-10-CM | POA: Diagnosis not present

## 2022-05-12 DIAGNOSIS — C679 Malignant neoplasm of bladder, unspecified: Secondary | ICD-10-CM | POA: Diagnosis not present

## 2022-05-12 DIAGNOSIS — Z87891 Personal history of nicotine dependence: Secondary | ICD-10-CM | POA: Insufficient documentation

## 2022-05-12 DIAGNOSIS — D509 Iron deficiency anemia, unspecified: Secondary | ICD-10-CM | POA: Diagnosis not present

## 2022-05-12 DIAGNOSIS — Z09 Encounter for follow-up examination after completed treatment for conditions other than malignant neoplasm: Secondary | ICD-10-CM

## 2022-05-12 DIAGNOSIS — I350 Nonrheumatic aortic (valve) stenosis: Secondary | ICD-10-CM

## 2022-05-12 DIAGNOSIS — Z510 Encounter for antineoplastic radiation therapy: Secondary | ICD-10-CM | POA: Diagnosis not present

## 2022-05-12 DIAGNOSIS — Z95828 Presence of other vascular implants and grafts: Secondary | ICD-10-CM

## 2022-05-12 DIAGNOSIS — Z5111 Encounter for antineoplastic chemotherapy: Secondary | ICD-10-CM | POA: Insufficient documentation

## 2022-05-12 DIAGNOSIS — E785 Hyperlipidemia, unspecified: Secondary | ICD-10-CM

## 2022-05-12 LAB — COMPREHENSIVE METABOLIC PANEL
ALT: 21 U/L (ref 0–44)
AST: 21 U/L (ref 15–41)
Albumin: 3.6 g/dL (ref 3.5–5.0)
Alkaline Phosphatase: 39 U/L (ref 38–126)
Anion gap: 6 (ref 5–15)
BUN: 22 mg/dL (ref 8–23)
CO2: 27 mmol/L (ref 22–32)
Calcium: 8.8 mg/dL — ABNORMAL LOW (ref 8.9–10.3)
Chloride: 105 mmol/L (ref 98–111)
Creatinine, Ser: 1.38 mg/dL — ABNORMAL HIGH (ref 0.61–1.24)
GFR, Estimated: 52 mL/min — ABNORMAL LOW (ref >60)
Glucose, Bld: 107 mg/dL — ABNORMAL HIGH (ref 70–99)
Potassium: 3.7 mmol/L (ref 3.5–5.1)
Sodium: 138 mmol/L (ref 135–145)
Total Bilirubin: 0.7 mg/dL (ref 0.3–1.2)
Total Protein: 6.5 g/dL (ref 6.5–8.1)

## 2022-05-12 LAB — RAD ONC ARIA SESSION SUMMARY
Course Elapsed Days: 30
Plan Fractions Treated to Date: 12
Plan Prescribed Dose Per Fraction: 1.8 Gy
Plan Total Fractions Prescribed: 25
Plan Total Prescribed Dose: 45 Gy
Reference Point Dosage Given to Date: 21.6 Gy
Reference Point Session Dosage Given: 1.8 Gy
Session Number: 23

## 2022-05-12 LAB — CBC WITH DIFFERENTIAL/PLATELET
Abs Immature Granulocytes: 0.01 10*3/uL (ref 0.00–0.07)
Basophils Absolute: 0 10*3/uL (ref 0.0–0.1)
Basophils Relative: 0 %
Eosinophils Absolute: 0.1 10*3/uL (ref 0.0–0.5)
Eosinophils Relative: 2 %
HCT: 28.6 % — ABNORMAL LOW (ref 39.0–52.0)
Hemoglobin: 9.6 g/dL — ABNORMAL LOW (ref 13.0–17.0)
Immature Granulocytes: 0 %
Lymphocytes Relative: 13 %
Lymphs Abs: 0.4 10*3/uL — ABNORMAL LOW (ref 0.7–4.0)
MCH: 35.3 pg — ABNORMAL HIGH (ref 26.0–34.0)
MCHC: 33.6 g/dL (ref 30.0–36.0)
MCV: 105.1 fL — ABNORMAL HIGH (ref 80.0–100.0)
Monocytes Absolute: 0.4 10*3/uL (ref 0.1–1.0)
Monocytes Relative: 14 %
Neutro Abs: 2.1 10*3/uL (ref 1.7–7.7)
Neutrophils Relative %: 71 %
Platelets: 97 10*3/uL — ABNORMAL LOW (ref 150–400)
RBC: 2.72 MIL/uL — ABNORMAL LOW (ref 4.22–5.81)
RDW: 14.6 % (ref 11.5–15.5)
WBC: 3 10*3/uL — ABNORMAL LOW (ref 4.0–10.5)
nRBC: 0 % (ref 0.0–0.2)

## 2022-05-12 LAB — MAGNESIUM: Magnesium: 2 mg/dL (ref 1.7–2.4)

## 2022-05-12 MED ORDER — SODIUM CHLORIDE 0.9% FLUSH
10.0000 mL | Freq: Once | INTRAVENOUS | Status: AC
  Administered 2022-05-12: 10 mL via INTRAVENOUS

## 2022-05-12 MED ORDER — SODIUM CHLORIDE 0.9 % IV SOLN: Freq: Once | INTRAVENOUS | Status: AC

## 2022-05-12 MED ORDER — SODIUM CHLORIDE 0.9 % IV SOLN
10.0000 mg | Freq: Once | INTRAVENOUS | Status: AC
  Administered 2022-05-12: 10 mg via INTRAVENOUS
  Filled 2022-05-12: qty 10

## 2022-05-12 MED ORDER — SODIUM CHLORIDE 0.9 % IV SOLN
500.0000 mg/m2/d | INTRAVENOUS | Status: DC
  Administered 2022-05-12: 5700 mg via INTRAVENOUS
  Filled 2022-05-12: qty 114

## 2022-05-12 MED ORDER — PALONOSETRON HCL INJECTION 0.25 MG/5ML
0.2500 mg | Freq: Once | INTRAVENOUS | Status: AC
  Administered 2022-05-12: 0.25 mg via INTRAVENOUS
  Filled 2022-05-12: qty 5

## 2022-05-12 NOTE — Progress Notes (Signed)
Cardiology Office Note:    Date:  05/12/2022   ID:  Steve, Mack 06-Jul-1945, MRN [id number]  PCP:  Assunta Found, MD   [facility] Providers Cardiologist:  [name], MD     Referring MD: Assunta Found, MD   Chief Complaint  Patient presents with   Coronary Artery Disease    History of Present Illness:    [name] is a 77 y.o. male with coronary artery disease presents for follow-up evaluation.  The patient underwent two-vessel CABG in 1997 with a LIMA to LAD and RIMA to RCA graft.  He underwent PCI in 2017 with overlapping drug-eluting stents in the distal RCA through the RIMA graft.  He has also had stenting of the native left main and circumflex.  The patient is here alone today.  He has been going through radiation therapy for bladder cancer.  Complains of fatigue associated with this.  He does have exertional angina that has been stable over time.  This improved with increase of his isosorbide from 60 to 90 mg daily.  States that he sometimes has anginal chest discomfort described as a pressure-like sensation in the upper chest with walking at a brisk pace or uphill.  Symptoms resolved with rest and then he is able to walk further with no further symptoms.  Mild shortness of breath with activity is stable.  No orthopnea, PND, leg swelling, heart palpitations, or syncope.  Overall feels that his symptoms have been stable over time.  He is tolerating DAPT with aspirin and clopidogrel without bleeding problems.  No other complaints today.  Past Medical History:  Diagnosis Date   Anginal pain (HCC)    Arthritis    "all over"   Bladder tumor    Chronic lower back pain    Coronary artery disease    a. s/p CABG x 2 (LIMA->LAD, RIMA->RCA);  b. s/p multiple PCI's to Ramus;  c. 08/2011 Cath/PCI: LM 70% into ramus with 70-80% there->treated wtih 3.5x18 Xience Xpedition DES, LCX  nonobs, RCA occluded.  RIMA & LIMA patent, EF 55-65%   DJD (degenerative joint  disease)    GERD (gastroesophageal reflux disease)    History of gout    Hyperlipidemia    Hypertension    Hypothyroidism    Neuropathy    OSA on CPAP    Prostate cancer [facility])     Past Surgical History:  Procedure Laterality Date   ABDOMINAL HERNIA REPAIR     BIOPSY  11/26/2015   Procedure: BIOPSY;  Surgeon: Malissa Hippo, MD;  [facility];  Service: Endoscopy;;  gastric esophagus   BIOPSY  01/20/2021   Procedure: BIOPSY;  Surgeon: Dolores Frame, MD;  [facility];  Service: Gastroenterology;;   BIOPSY  10/28/2021   Procedure: BIOPSY;  Surgeon: Dolores Frame, MD;  [facility];  Service: Gastroenterology;;   BLADDER TUMOR EXCISION     BROW LIFT Bilateral 04/27/2019   Procedure: BILATERAL BLEPHAROPLASTY;  Surgeon: Fabio Pierce, MD;  Location: [facility];  Service: Ophthalmology;  Laterality: Bilateral;   CARDIAC CATHETERIZATION N/A 04/10/2015   Procedure: Left Heart Cath and Cors/Grafts Angiography;  Surgeon: [name], MD;  Location: [facility] INVASIVE CV LAB;  Service: Cardiovascular;  Laterality: N/A;   CARDIAC CATHETERIZATION N/A 04/10/2015   Procedure: Coronary Stent Intervention;  Surgeon: [name], MD;  Location: [facility] INVASIVE CV LAB;  Service: Cardiovascular;  Laterality: N/A;   CATARACT EXTRACTION W/PHACO Right 06/23/2015   Procedure:  CATARACT EXTRACTION PHACO AND INTRAOCULAR LENS PLACEMENT RIGHT EYE; CDE:  7.08;  Surgeon: Gemma Payor, MD;  Location: [facility];  Service: Ophthalmology;  Laterality: Right;   CATARACT EXTRACTION W/PHACO Left 08/25/2015   Procedure: CATARACT EXTRACTION PHACO AND INTRAOCULAR LENS PLACEMENT LEFT EYE; CDE:  8.18;  Surgeon: Gemma Payor, MD;  Location: [facility];  Service: Ophthalmology;  Laterality: Left;   COLONOSCOPY N/A 12/28/2012   Procedure: COLONOSCOPY;  Surgeon: Malissa Hippo, MD;  [facility];  Service: Endoscopy;  Laterality: N/A;  830 rescheduled   COLONOSCOPY WITH PROPOFOL N/A  01/20/2021   Procedure: COLONOSCOPY WITH PROPOFOL;  Surgeon: Dolores Frame, MD;  [facility];  Service: Gastroenterology;  Laterality: N/A;  11:05   CORONARY ANGIOPLASTY WITH STENT PLACEMENT  2013; 04/10/2015   "this makes me a total of 7" (04/10/2015)   CORONARY ARTERY BYPASS GRAFT  1997   with (LIMA)   ESOPHAGEAL DILATION N/A 11/26/2015   Procedure: ESOPHAGEAL DILATION;  Surgeon: Malissa Hippo, MD;  [facility];  Service: Endoscopy;  Laterality: N/A;   ESOPHAGEAL DILATION N/A 05/23/2017   Procedure: ESOPHAGEAL DILATION;  Surgeon: Malissa Hippo, MD;  [facility];  Service: Endoscopy;  Laterality: N/A;   ESOPHAGEAL DILATION N/A 06/16/2017   Procedure: ESOPHAGEAL DILATION;  Surgeon: Malissa Hippo, MD;  [facility];  Service: Endoscopy;  Laterality: N/A;   ESOPHAGEAL DILATION  10/28/2021   Procedure: ESOPHAGEAL DILATION;  Surgeon: Dolores Frame, MD;  [facility];  Service: Gastroenterology;;   ESOPHAGOGASTRODUODENOSCOPY N/A 11/26/2015   Procedure: ESOPHAGOGASTRODUODENOSCOPY (EGD);  Surgeon: Malissa Hippo, MD;  [facility];  Service: Endoscopy;  Laterality: N/A;  1:25   ESOPHAGOGASTRODUODENOSCOPY N/A 05/23/2017   Procedure: ESOPHAGOGASTRODUODENOSCOPY (EGD);  Surgeon: Malissa Hippo, MD;  [facility];  Service: Endoscopy;  Laterality: N/A;  1:15   ESOPHAGOGASTRODUODENOSCOPY N/A 06/16/2017   Procedure: ESOPHAGOGASTRODUODENOSCOPY (EGD);  Surgeon: Malissa Hippo, MD;  [facility];  Service: Endoscopy;  Laterality: N/A;  1240   ESOPHAGOGASTRODUODENOSCOPY (EGD) WITH PROPOFOL N/A 01/20/2021   Procedure: ESOPHAGOGASTRODUODENOSCOPY (EGD) WITH PROPOFOL;  Surgeon: Dolores Frame, MD;  [facility];  Service: Gastroenterology;  Laterality: N/A;   ESOPHAGOGASTRODUODENOSCOPY (EGD) WITH PROPOFOL N/A 10/28/2021   Procedure: ESOPHAGOGASTRODUODENOSCOPY (EGD) WITH  PROPOFOL;  Surgeon: Dolores Frame, MD;  [facility];  Service: Gastroenterology;  Laterality: N/A;  215 ASA 2, pt will arrive at 10:45   HERNIA REPAIR Left    IR IMAGING GUIDED PORT INSERTION  02/24/2022   KNEE CARTILAGE SURGERY Bilateral    LEFT HEART CATH AND CORS/GRAFTS ANGIOGRAPHY N/A 09/08/2016   Procedure: LEFT HEART CATH AND CORS/GRAFTS ANGIOGRAPHY;  Surgeon: [place], Peter M, MD;  Location: [facility] INVASIVE CV LAB;  Service: Cardiovascular;  Laterality: N/A;   LEFT HEART CATHETERIZATION WITH CORONARY ANGIOGRAM N/A 09/08/2011   Procedure: LEFT HEART CATHETERIZATION WITH CORONARY ANGIOGRAM;  Surgeon: [name], MD;  Location: [facility] CATH LAB;  Service: Cardiovascular;  Laterality: N/A;   PERCUTANEOUS CORONARY STENT INTERVENTION (PCI-S) Right 09/08/2011   Procedure: PERCUTANEOUS CORONARY STENT INTERVENTION (PCI-S);  Surgeon: [name], MD;  Location: [facility] CATH LAB;  Service: Cardiovascular;  Laterality: Right;   POLYPECTOMY  01/20/2021   Procedure: POLYPECTOMY;  Surgeon: Dolores Frame, MD;  [facility];  Service: Gastroenterology;;   ROBOT ASSISTED LAPAROSCOPIC RADICAL PROSTATECTOMY      Current Medications: Current Meds  Medication Sig   amLODipine (  NORVASC) 10 MG tablet Take 10 mg by mouth in the morning.   aspirin EC 81 MG tablet Take 81 mg by mouth every evening.   atorvastatin (LIPITOR) 40 MG tablet Take 0.5 tablets (20 mg total) by mouth every evening.   carvedilol (COREG) 6.25 MG tablet Take 1 tablet (6.25 mg total) by mouth 2 (two) times daily.   cetirizine (ZYRTEC) 10 MG tablet Take 10 mg by mouth every evening.   cholecalciferol (VITAMIN D3) 25 MCG (1000 UNIT) tablet Take 1,000 Units by mouth every evening.   clopidogrel (PLAVIX) 75 MG tablet Take 1 tablet (75 mg total) by mouth in the morning.   Coenzyme Q10 300 MG CAPS Take 300 mg by mouth every evening.   cyanocobalamin (,VITAMIN B-12,) 1000 MCG/ML injection Inject 1,000 mcg into  the muscle See admin instructions. Inject (1000 mcg) intramuscularly once monthly as needed for B-12 deficiency.   fenofibrate 160 MG tablet Take 1 tablet (160 mg total) by mouth daily. (Patient taking differently: Take 160 mg by mouth every evening.)   FEROSUL 325 (65 Fe) MG tablet TAKE 1 TABLET(325 MG) BY MOUTH TWICE DAILY WITH A MEAL   gabapentin (NEURONTIN) 600 MG tablet Take 600 mg by mouth 3 (three) times daily.   GLUCOSAMINE-CHONDROITIN PO Take 1 tablet by mouth every evening.   isosorbide mononitrate (IMDUR) 60 MG 24 hr tablet Take 1.5 tablets (90 mg total) by mouth daily. (Patient taking differently: Take 90 mg by mouth every evening.)   levothyroxine (SYNTHROID, LEVOTHROID) 150 MCG tablet Take 150 mcg by mouth daily before breakfast.   lidocaine-prilocaine (EMLA) cream Apply to affected area once   meclizine (ANTIVERT) 25 MG tablet Take 1 tablet (25 mg total) by mouth 2 (two) times daily as needed for dizziness. (Patient taking differently: Take 12.5 mg by mouth at bedtime as needed for dizziness.)   mirabegron ER (MYRBETRIQ) 25 MG TB24 tablet Take 1 tablet (25 mg total) by mouth daily.   nitroGLYCERIN (NITROSTAT) 0.4 MG SL tablet Place 1 tablet (0.4 mg total) under the tongue every 5 (five) minutes as needed for chest pain.   Polyethyl Glycol-Propyl Glycol (LUBRICANT EYE DROPS) 0.4-0.3 % SOLN Place 1-2 drops into both eyes 3 (three) times daily as needed (dry/irritated eyes.).   prochlorperazine (COMPAZINE) 10 MG tablet Take 1 tablet (10 mg total) by mouth every 6 (six) hours as needed for nausea or vomiting.   traMADol (ULTRAM) 50 MG tablet Take 50 mg by mouth 3 (three) times daily.   [DISCONTINUED] Omega-3 Fatty Acids (FISH OIL) 1000 MG CAPS Take 1 capsule by mouth in the morning.   [DISCONTINUED] psyllium (METAMUCIL) 58.6 % powder Take 1 packet by mouth 2 (two) times a week.     Allergies:   Codeine and Pollen extract   Social History   Socioeconomic History   Marital status:  Widowed    Spouse name: Not on file   Number of children: Not on file   Years of education: Not on file   Highest education level: Not on file  Occupational History   Occupation: semi-retired  Tobacco Use   Smoking status: Former    Types: Cigars    Passive exposure: Past   Smokeless tobacco: Never   Tobacco comments:    "quit smoking in ~ 1978"  Vaping Use   Vaping Use: Never used  Substance and Sexual Activity   Alcohol use: Yes    Comment: occasionally    Drug use: No   Sexual activity: Not Currently  Other Topics Concern   Not on file  Social History Narrative   Not on file   Social Determinants of Health   Financial Resource Strain: Low Risk  (05/06/2022)   Overall Financial Resource Strain (CARDIA)    Difficulty of Paying Living Expenses: Not hard at all  Food Insecurity: No Food Insecurity (02/04/2022)   Hunger Vital Sign    Worried About Running Out of Food in the Last Year: Never true    Ran Out of Food in the Last Year: Never true  Transportation Needs: No Transportation Needs (05/06/2022)   PRAPARE - Administrator, Civil Service (Medical): No    Lack of Transportation (Non-Medical): No  Physical Activity: Not on file  Stress: Not on file  Social Connections: Not on file     Family History: The patient's family history includes Cancer (age of onset: 58) in his father; Stroke (age of onset: 67) in his mother.  ROS:   Please see the history of present illness.    All other systems reviewed and are negative.  EKGs/Labs/Other Studies Reviewed:    The following studies were reviewed today: Cardiac Studies & Procedures   CARDIAC CATHETERIZATION  CARDIAC CATHETERIZATION 09/08/2016  Narrative  Mid RCA lesion, 100 %stenosed.  RIMA.  Dist RCA lesion, 0 %stenosed.  A drug eluting .  Ost RPDA lesion, 0 %stenosed.  A drug eluting .  Prox LAD lesion, 100 %stenosed.  LIMA.  Ost LM to LM lesion, 25 %stenosed.  Ost Ramus to Ramus lesion,  25 %stenosed.  The left ventricular systolic function is normal.  LV end diastolic pressure is mildly elevated.  The left ventricular ejection fraction is 55-65% by visual estimate.  1. 2 vessel occlusive CAD 2. Patent stents in the left main and large ramus intermediate branch 3. Patent stents in the distal RCA 4. Patent free RIMA to the distal RCA 5. Patent LIMA to the LAD 6. Normal LV function 7. Mildly elevated LVEDP.  Findings Coronary Findings Diagnostic  Dominance: Right  Left Main The lesion was previously treated.  Left Anterior Descending  Ramus Intermedius The lesion was previously treated.  Right Coronary Artery  Dist RCA lesion with no stenosis was previously treated.  Right Posterior Descending Artery Ost RPDA lesion with no stenosis was previously treated.  RIMA Graft To Mid RCA RIMA. Patent graft - free RIMA  LIMA LIMA Graft To Mid LAD LIMA. LIMA-LAD patent  Intervention  No interventions have been documented.   CARDIAC CATHETERIZATION  CARDIAC CATHETERIZATION 04/10/2015  Narrative  Mid RCA lesion, 100% stenosed.  RIMA .  Patent graft - free RIMA  Prox LAD lesion, 100% stenosed.  LIMA .  LIMA-LAD patent  Ost LM to LM lesion, 25% stenosed. The lesion was previously treated with a stent (unknown type).  Ost Ramus to Ramus lesion, 25% stenosed. The lesion was previously treated with a stent (unknown type).  Ost RPDA lesion, 100% stenosed. Post intervention, there is a 0% residual stenosis.  Dist RCA lesion, 99% stenosed. Post intervention, there is a 0% residual stenosis.  1. Severe native vessel CAD with total occlusion of the RCA, total occlusion of the LAD, and patency of the stented segment of the left main into the ramus. 2. Severe stenosis of the distal RCA treated successfully with PCI (drug-eluting stent) 3. Total occlusion of the PDA following distal RCA stenting, likely due to plaque shifting, treated successfully with  PCI (DES), but with distal vessel occlusion possibly due to distal  vessel dissection (pt asymptomatic)  Continue DAPT. Will transition to Brilinta tomorrow am. Pt would be a candidate for the Twilight Trial if he meets criteria.  Findings Coronary Findings Diagnostic  Dominance: Right  Left Main The lesion was previously treated with a stent (unknown type).  Left Anterior Descending  Ramus Intermedius The lesion was previously treated with a stent (unknown type).  Right Coronary Artery  Right Posterior Descending Artery  RIMA Graft To Mid RCA RIMA Patent graft - free RIMA  LIMA LIMA Graft To Mid LAD LIMA LIMA-LAD patent  Intervention  Dist RCA lesion PCI The pre-interventional distal flow is normal (TIMI 3). Pre-stent angioplasty was performed. A drug-eluting stent was placed. Post-stent angioplasty was performed. The post-interventional distal flow is normal (TIMI 3). The intervention was successful. No complications occurred at this lesion. The patient has been on long-term dual antiplatelet therapy with aspirin and Plavix. Heparin is used for anticoagulation. A 6 [place] multipurpose guide catheters used. A cougar wire is advanced across the lesion. The lesion is predilated with a 2.0 mm balloon. The lesion is then stented with a 2.25 x 18 mm resolute DES. After the lesion is stented, there is a distal plaque shift and the PDA is occluded. The cougar wire is left in place and a whisper wire was advanced across the lesion. I tried to advance a balloon but it would not cross. Wire position is lost in a new whisper wire was used to cross the lesion. I was unable to dilate the PDA with a 2.0 mm balloon. The PDA is then stented in overlapping fashion with the distal RCA stent using a 2.25 x 18 mm resolute DES. The distal PDA is subtotally occluded and there may be dissection distally. The patient was chest pain and symptom-free. I attempted to rewire the PDA and the distal occlusion was so  far out that I did not feel comfortable using another balloon. The patient remained stable throughout and had no chest pain. There was 0% residual stenosis at the lesion site. There was 0% residual stenosis at the PDA site. There was TIMI-3 flow through the lesion with distal PDA occlusion likely related to dissection. There is a 0% residual stenosis post intervention.  Ost RPDA lesion PCI There is no pre-interventional antegrade distal flow (TIMI 0). Pre-stent angioplasty was performed. A drug-eluting stent was placed. Post-stent angioplasty was performed. The post-interventional distal flow is normal (TIMI 3). The intervention was successful. At this lesion, a dissection occurred. There is a 0% residual stenosis post intervention.   STRESS TESTS  NM MYOCAR MULTI W/SPECT W 10/12/2021  Narrative   Findings are consistent with prior myocardial infarction with peri-infarct ischemia. The study is intermediate risk.   No ST deviation was noted.   Left ventricular function is normal. End diastolic cavity size is normal.  Small, Mild fixed defect in the inferolateral wall that is mildy worse with stress. C/f small inferolateral scar with mild per-infarct ischemia   ECHOCARDIOGRAM  ECHOCARDIOGRAM COMPLETE 10/09/2021  Narrative ECHOCARDIOGRAM REPORT    Patient Name:   VASH QUEZADA Date of Exam: 10/09/2021 Medical Rec #:  [id number]          Height:       72.0 in Accession #:    [id number]         Weight:       216.8 lb Date of Birth:  1945-07-04          BSA:  2.205 m Patient Age:    76 years           BP:           143/69 mmHg Patient Gender: M                  HR:           60 bpm. [facility]  Procedure: 2D Echo, Cardiac Doppler and Color Doppler  Indications:    Z01.810 (ICD-10-CM) - Preoperative cardiovascular examination  History:        Patient has prior history of Echocardiogram examinations, most recent 04/22/2011. CAD, Prior CABG,  Signs/Symptoms:Murmur; Risk Factors:Hypertension and Dyslipidemia. Obstructive sleep apnea on CPAP.  Sonographer:    Celesta Gentile RCS Referring Phys: [id number] ELIZABETH PECK  IMPRESSIONS   1. Left ventricular ejection fraction, by estimation, is 65 to 70%. The left ventricle has normal function. The left ventricle has no regional wall motion abnormalities. There is moderate asymmetric left ventricular hypertrophy. Left ventricular diastolic parameters are indeterminate. 2. Right ventricular systolic function is normal. The right ventricular size is normal. Tricuspid regurgitation signal is inadequate for assessing PA pressure. 3. Left atrial size was mildly dilated. 4. Right atrial size was mildly dilated. 5. The mitral valve is grossly normal. No evidence of mitral valve regurgitation. No evidence of mitral stenosis. Moderate mitral annular calcification. 6. The aortic valve is tricuspid. There is mild calcification of the aortic valve. There is mild thickening of the aortic valve. Aortic valve regurgitation is not visualized. Mild aortic valve stenosis. Normal stoke volume index. 7. The inferior vena cava is normal in size with <50% respiratory variability, suggesting right atrial pressure of 8 mmHg.  Comparison(s): Unable to view 2013 study.  FINDINGS Left Ventricle: Left ventricular ejection fraction, by estimation, is 65 to 70%. The left ventricle has normal function. The left ventricle has no regional wall motion abnormalities. The left ventricular internal cavity size was normal in size. There is moderate asymmetric left ventricular hypertrophy. Left ventricular diastolic parameters are indeterminate.  Right Ventricle: The right ventricular size is normal. No increase in right ventricular wall thickness. Right ventricular systolic function is normal. Tricuspid regurgitation signal is inadequate for assessing PA pressure.  Left Atrium: Left atrial size was mildly dilated.  Right  Atrium: Right atrial size was mildly dilated.  Pericardium: There is no evidence of pericardial effusion. Presence of epicardial fat layer.  Mitral Valve: The mitral valve is grossly normal. Moderate mitral annular calcification. No evidence of mitral valve regurgitation. No evidence of mitral valve stenosis.  Tricuspid Valve: The tricuspid valve is normal in structure. Tricuspid valve regurgitation is not demonstrated. No evidence of tricuspid stenosis.  Aortic Valve: The aortic valve is tricuspid. There is mild calcification of the aortic valve. There is mild thickening of the aortic valve. There is mild aortic valve annular calcification. Aortic valve regurgitation is not visualized. Mild aortic stenosis is present. Aortic valve mean gradient measures 13.0 mmHg. Aortic valve peak gradient measures 20.2 mmHg. Aortic valve area, by VTI measures 3.47 cm.  Pulmonic Valve: The pulmonic valve was normal in structure. Pulmonic valve regurgitation is not visualized. No evidence of pulmonic stenosis.  Aorta: The aortic root is normal in size and structure.  Venous: The inferior vena cava is normal in size with less than 50% respiratory variability, suggesting right atrial pressure of 8 mmHg.  IAS/Shunts: No atrial level shunt detected by color flow Doppler.   LEFT VENTRICLE PLAX 2D LVIDd:  5.00 cm   Diastology LVIDs:         3.10 cm   LV e' medial:    8.59 cm/s LV PW:         1.10 cm   LV E/e' medial:  11.9 LV IVS:        1.00 cm   LV e' lateral:   9.01 cm/s LVOT diam:     2.30 cm   LV E/e' lateral: 11.3 LV SV:         179 LV SV Index:   81 LVOT Area:     4.15 cm   RIGHT VENTRICLE RV S prime:     14.30 cm/s TAPSE (M-mode): 1.8 cm  LEFT ATRIUM              Index        RIGHT ATRIUM           Index LA diam:        5.10 cm  2.31 cm/m   RA Area:     25.00 cm LA Vol (A2C):   101.0 ml 45.81 ml/m  RA Volume:   78.10 ml  35.42 ml/m LA Vol (A4C):   68.8 ml  31.20 ml/m LA  Biplane Vol: 83.3 ml  37.78 ml/m AORTIC VALVE AV Area (Vmax):    3.16 cm AV Area (Vmean):   3.02 cm AV Area (VTI):     3.47 cm AV Vmax:           225.00 cm/s AV Vmean:          169.000 cm/s AV VTI:            0.515 m AV Peak Grad:      20.2 mmHg AV Mean Grad:      13.0 mmHg LVOT Vmax:         171.00 cm/s LVOT Vmean:        123.000 cm/s LVOT VTI:          0.430 m LVOT/AV VTI ratio: 0.83  AORTA Ao Root diam: 3.60 cm  MITRAL VALVE MV Area (PHT): 2.62 cm     SHUNTS MV Decel Time: 289 msec     Systemic VTI:  0.43 m MV E velocity: 102.00 cm/s  Systemic Diam: 2.30 cm MV A velocity: 77.10 cm/s MV E/A ratio:  1.32  Riley Lam MD Electronically signed by Riley Lam MD Signature Date/Time: 10/09/2021/4:23:47 PM    Final              EKG:  EKG is not ordered today.    Recent Labs: 11/11/2021: TSH 1.390 05/12/2022: ALT 21; BUN 22; Creatinine, Ser 1.38; Hemoglobin 9.6; Magnesium 2.0; Platelets 97; Potassium 3.7; Sodium 138  Recent Lipid Panel    Component Value Date/Time   CHOL 121 10/06/2021 1542   TRIG 226 (H) 10/06/2021 1542   HDL 36 (L) 10/06/2021 1542   CHOLHDL 3.4 10/06/2021 1542   LDLCALC 49 10/06/2021 1542     Risk Assessment/Calculations:                Physical Exam:    VS:  BP 130/70   Pulse 63   Ht 6\' 1"  (1.854 m)   Wt 221 lb 12.8 oz (100.6 kg)   SpO2 95%   BMI 29.26 kg/m     Wt Readings from Last 3 Encounters:  05/12/22 221 lb 12.8 oz (100.6 kg)  05/12/22 217 lb (98.4 kg)  04/28/22 219 lb 1.6 oz (99.4 kg)  GEN:  Well nourished, well developed in no acute distress HEENT: Normal NECK: No JVD; No carotid bruits LYMPHATICS: No lymphadenopathy CARDIAC: RRR, 2/6 systolic murmur at the right upper sternal border RESPIRATORY:  Clear to auscultation without rales, wheezing or rhonchi  ABDOMEN: Soft, non-tender, non-distended MUSCULOSKELETAL:  No edema; No deformity  SKIN: Warm and dry NEUROLOGIC:  Alert and oriented x  3 PSYCHIATRIC:  Normal affect   ASSESSMENT:    1. Coronary artery disease involving native coronary artery of native heart with angina pectoris (HCC)   2. Essential hypertension   3. Hyperlipidemia LDL goal <70   4. Nonrheumatic aortic valve stenosis    PLAN:    In order of problems listed above:  Stable CCS functional class II symptoms of angina on aspirin and clopidogrel, high intensity statin drug, and antianginal therapy with amlodipine and isosorbide.  Continue current management.  Advised to watch out for any progressive symptoms.  Instructions given about seeking immediate medical attention if resting angina occurs. Blood pressure well-controlled on current regimen outlined above.  Continue same. Treated with a high intensity statin drug as well as fenofibrate.  Last lipids with an LDL cholesterol of 49.  Triglycerides are 226.  Lifestyle modification discussed. Patient with mild aortic stenosis.  Last echo from September 2023 reviewed.  Clinical follow-up indicated.  Anticipate repeat echo in 1 to 2 years.      Medication Adjustments/Labs and Tests Ordered: Current medicines are reviewed at length with the patient today.  Concerns regarding medicines are outlined above.  No orders of the defined types were placed in this encounter.  No orders of the defined types were placed in this encounter.   There are no Patient Instructions on file for this visit.   Signed, [name], MD  05/12/2022 6:10 PM    [facility]

## 2022-05-12 NOTE — Progress Notes (Signed)
Patient presents today for chemotherapy infusion. Patient is in satisfactory condition with no new complaints voiced.  Vital signs are stable.  Labs reviewed by Dr. Ellin Saba during the office visit .  Platelets today are 97.  All other labs are within treatment parameters.  We will proceed with treatment per MD orders.   Home infusion 5FU pump was connected with no issues.  Patient left ambulatory in stable condition.  Vital signs stable at discharge.  Follow up as scheduled.

## 2022-05-12 NOTE — Progress Notes (Signed)
Patient has been examined by Dr. Ellin Saba. Vital signs and labs have been reviewed by MD - ANC, Creatinine, LFTs, hemoglobin, and platelets (97,000) are within treatment parameters per M.D. - pt may proceed with treatment.  Primary RN and pharmacy notified.

## 2022-05-12 NOTE — Patient Instructions (Signed)
[facility] at [facility] Discharge Instructions   You were seen and examined today by Dr. Katragadda.  He reviewed the results of your lab work which are normal/stable.   We will proceed with your treatment today.  Return as scheduled.    Thank you for choosing [facility] at [facility] to provide your oncology and hematology care.  To afford each patient quality time with our provider, please arrive at least 15 minutes before your scheduled appointment time.   If you have a lab appointment with the Cancer Center please come in thru the Main Entrance and check in at the main information desk.  You need to re-schedule your appointment should you arrive 10 or more minutes late.  We strive to give you quality time with our providers, and arriving late affects you and other patients whose appointments are after yours.  Also, if you no show three or more times for appointments you may be dismissed from the clinic at the providers discretion.     Again, thank you for choosing [facility].  Our hope is that these requests will decrease the amount of time that you wait before being seen by our physicians.       _____________________________________________________________  Should you have questions after your visit to [facility], please contact our office at [phone] and follow the prompts.  Our office hours are 8:00 a.m. and 4:30 p.m. Monday - Friday.  Please note that voicemails left after 4:00 p.m. may not be returned until the following business day.  We are closed weekends and major holidays.  You do have access to a nurse 24-7, just call the main number to the clinic [phone] and do not press any options, hold on the line and a nurse will answer the phone.    For prescription refill requests, have your pharmacy contact our office and allow 72 hours.    Due to Covid, you will need to wear a mask upon entering  the hospital. If you do not have a mask, a mask will be given to you at the Main Entrance upon arrival. For doctor visits, patients may have 1 support person age 18 or older with them. For treatment visits, patients can not have anyone with them due to social distancing guidelines and our immunocompromised population.      

## 2022-05-12 NOTE — Patient Instructions (Signed)
[facility]  Discharge Instructions: Thank you for choosing [facility] to provide your oncology and hematology care.  If you have a lab appointment with the Cancer Center - please note that after April 8th, 2024, all labs will be drawn in the cancer center.  You do not have to check in or register with the main entrance as you have in the past but will complete your check-in in the cancer center.  Wear comfortable clothing and clothing appropriate for easy access to any Portacath or PICC line.   We strive to give you quality time with your provider. You may need to reschedule your appointment if you arrive late (15 or more minutes).  Arriving late affects you and other patients whose appointments are after yours.  Also, if you miss three or more appointments without notifying the office, you may be dismissed from the clinic at the provider's discretion.      For prescription refill requests, have your pharmacy contact our office and allow 72 hours for refills to be completed.    Today you received the following chemotherapy and/or immunotherapy agents 5FU.  Fluorouracil Injection What is this medication? FLUOROURACIL (flure oh YOOR a sil) treats some types of cancer. It works by slowing down the growth of cancer cells. This medicine may be used for other purposes; ask your health care provider or pharmacist if you have questions. COMMON BRAND NAME(S): Adrucil What should I tell my care team before I take this medication? They need to know if you have any of these conditions: Blood disorders Dihydropyrimidine dehydrogenase (DPD) deficiency Infection, such as chickenpox, cold sores, herpes Kidney disease Liver disease Poor nutrition Recent or ongoing radiation therapy An unusual or allergic reaction to fluorouracil, other medications, foods, dyes, or preservatives If you or your partner are pregnant or trying to get pregnant Breast-feeding How should I  use this medication? This medication is injected into a vein. It is administered by your care team in a hospital or clinic setting. Talk to your care team about the use of this medication in children. Special care may be needed. Overdosage: If you think you have taken too much of this medicine contact a poison control center or emergency room at once. NOTE: This medicine is only for you. Do not share this medicine with others. What if I miss a dose? Keep appointments for follow-up doses. It is important not to miss your dose. Call your care team if you are unable to keep an appointment. What may interact with this medication? Do not take this medication with any of the following: Live virus vaccines This medication may also interact with the following: Medications that treat or prevent blood clots, such as warfarin, enoxaparin, dalteparin This list may not describe all possible interactions. Give your health care provider a list of all the medicines, herbs, non-prescription drugs, or dietary supplements you use. Also tell them if you smoke, drink alcohol, or use illegal drugs. Some items may interact with your medicine. What should I watch for while using this medication? Your condition will be monitored carefully while you are receiving this medication. This medication may make you feel generally unwell. This is not uncommon as chemotherapy can affect healthy cells as well as cancer cells. Report any side effects. Continue your course of treatment even though you feel ill unless your care team tells you to stop. In some cases, you may be given additional medications to help with side effects. Follow all  directions for their use. This medication may increase your risk of getting an infection. Call your care team for advice if you get a fever, chills, sore throat, or other symptoms of a cold or flu. Do not treat yourself. Try to avoid being around people who are sick. This medication may increase  your risk to bruise or bleed. Call your care team if you notice any unusual bleeding. Be careful brushing or flossing your teeth or using a toothpick because you may get an infection or bleed more easily. If you have any dental work done, tell your dentist you are receiving this medication. Avoid taking medications that contain aspirin, acetaminophen, ibuprofen, naproxen, or ketoprofen unless instructed by your care team. These medications may hide a fever. Do not treat diarrhea with over the counter products. Contact your care team if you have diarrhea that lasts more than 2 days or if it is severe and watery. This medication can make you more sensitive to the sun. Keep out of the sun. If you cannot avoid being in the sun, wear protective clothing and sunscreen. Do not use sun lamps, tanning beds, or tanning booths. Talk to your care team if you or your partner wish to become pregnant or think you might be pregnant. This medication can cause serious birth defects if taken during pregnancy and for 3 months after the last dose. A reliable form of contraception is recommended while taking this medication and for 3 months after the last dose. Talk to your care team about effective forms of contraception. Do not father a child while taking this medication and for 3 months after the last dose. Use a condom while having sex during this time period. Do not breastfeed while taking this medication. This medication may cause infertility. Talk to your care team if you are concerned about your fertility. What side effects may I notice from receiving this medication? Side effects that you should report to your care team as soon as possible: Allergic reactions--skin rash, itching, hives, swelling of the face, lips, tongue, or throat Heart attack--pain or tightness in the chest, shoulders, arms, or jaw, nausea, shortness of breath, cold or clammy skin, feeling faint or lightheaded Heart failure--shortness of breath,  swelling of the ankles, feet, or hands, sudden weight gain, unusual weakness or fatigue Heart rhythm changes--fast or irregular heartbeat, dizziness, feeling faint or lightheaded, chest pain, trouble breathing High ammonia level--unusual weakness or fatigue, confusion, loss of appetite, nausea, vomiting, seizures Infection--fever, chills, cough, sore throat, wounds that don't heal, pain or trouble when passing urine, general feeling of discomfort or being unwell Low red blood cell level--unusual weakness or fatigue, dizziness, headache, trouble breathing Pain, tingling, or numbness in the hands or feet, muscle weakness, change in vision, confusion or trouble speaking, loss of balance or coordination, trouble walking, seizures Redness, swelling, and blistering of the skin over hands and feet Severe or prolonged diarrhea Unusual bruising or bleeding Side effects that usually do not require medical attention (report to your care team if they continue or are bothersome): Dry skin Headache Increased tears Nausea Pain, redness, or swelling with sores inside the mouth or throat Sensitivity to light Vomiting This list may not describe all possible side effects. Call your doctor for medical advice about side effects. You may report side effects to FDA at 1-800-FDA-1088. Where should I keep my medication? This medication is given in a hospital or clinic. It will not be stored at home. NOTE: This sheet is a summary. It may  not cover all possible information. If you have questions about this medicine, talk to your doctor, pharmacist, or health care provider.  2023 Elsevier/Gold Standard (2021-04-28 00:00:00)        To help prevent nausea and vomiting after your treatment, we encourage you to take your nausea medication as directed.  BELOW ARE SYMPTOMS THAT SHOULD BE REPORTED IMMEDIATELY: *FEVER GREATER THAN 100.4 F (38 C) OR HIGHER *CHILLS OR SWEATING *NAUSEA AND VOMITING THAT IS NOT CONTROLLED  WITH YOUR NAUSEA MEDICATION *UNUSUAL SHORTNESS OF BREATH *UNUSUAL BRUISING OR BLEEDING *URINARY PROBLEMS (pain or burning when urinating, or frequent urination) *BOWEL PROBLEMS (unusual diarrhea, constipation, pain near the anus) TENDERNESS IN MOUTH AND THROAT WITH OR WITHOUT PRESENCE OF ULCERS (sore throat, sores in mouth, or a toothache) UNUSUAL RASH, SWELLING OR PAIN  UNUSUAL VAGINAL DISCHARGE OR ITCHING   Items with * indicate a potential emergency and should be followed up as soon as possible or go to the Emergency Department if any problems should occur.  Please show the CHEMOTHERAPY ALERT CARD or IMMUNOTHERAPY ALERT CARD at check-in to the Emergency Department and triage nurse.  Should you have questions after your visit or need to cancel or reschedule your appointment, please contact [facility] [phone]  and follow the prompts.  Office hours are 8:00 a.m. to 4:30 p.m. Monday - Friday. Please note that voicemails left after 4:00 p.m. may not be returned until the following business day.  We are closed weekends and major holidays. You have access to a nurse at all times for urgent questions. Please call the main number to the clinic [phone] and follow the prompts.  For any non-urgent questions, you may also contact your provider using MyChart. We now offer e-Visits for anyone 86 and older to request care online for non-urgent symptoms. For details visit [email or web address].   Also download the MyChart app! Go to the app store, search "MyChart", open the app, select [place], and log in with your MyChart username and password.

## 2022-05-13 ENCOUNTER — Ambulatory Visit
Admission: RE | Admit: 2022-05-13 | Discharge: 2022-05-13 | Disposition: A | Discharge status: Home / Self Care | Payer: Medicare Other | Source: Ambulatory Visit | Attending: Radiation Oncology | Admitting: Radiation Oncology

## 2022-05-13 ENCOUNTER — Other Ambulatory Visit: Payer: Self-pay

## 2022-05-13 DIAGNOSIS — Z510 Encounter for antineoplastic radiation therapy: Secondary | ICD-10-CM | POA: Diagnosis not present

## 2022-05-13 DIAGNOSIS — C672 Malignant neoplasm of lateral wall of bladder: Secondary | ICD-10-CM | POA: Diagnosis not present

## 2022-05-13 LAB — RAD ONC ARIA SESSION SUMMARY
Course Elapsed Days: 31
Plan Fractions Treated to Date: 13
Plan Prescribed Dose Per Fraction: 1.8 Gy
Plan Total Fractions Prescribed: 25
Plan Total Prescribed Dose: 45 Gy
Reference Point Dosage Given to Date: 23.4 Gy
Reference Point Session Dosage Given: 1.8 Gy
Session Number: 24

## 2022-05-14 ENCOUNTER — Other Ambulatory Visit: Payer: Self-pay

## 2022-05-14 ENCOUNTER — Ambulatory Visit
Admission: RE | Admit: 2022-05-14 | Discharge: 2022-05-14 | Disposition: A | Discharge status: Home / Self Care | Payer: Medicare Other | Source: Ambulatory Visit | Attending: Radiation Oncology | Admitting: Radiation Oncology

## 2022-05-14 DIAGNOSIS — C672 Malignant neoplasm of lateral wall of bladder: Secondary | ICD-10-CM | POA: Diagnosis not present

## 2022-05-14 DIAGNOSIS — C679 Malignant neoplasm of bladder, unspecified: Secondary | ICD-10-CM | POA: Diagnosis not present

## 2022-05-14 DIAGNOSIS — Z510 Encounter for antineoplastic radiation therapy: Secondary | ICD-10-CM | POA: Diagnosis not present

## 2022-05-14 LAB — RAD ONC ARIA SESSION SUMMARY
Course Elapsed Days: 32
Plan Fractions Treated to Date: 14
Plan Prescribed Dose Per Fraction: 1.8 Gy
Plan Total Fractions Prescribed: 25
Plan Total Prescribed Dose: 45 Gy
Reference Point Dosage Given to Date: 25.2 Gy
Reference Point Session Dosage Given: 1.8 Gy
Session Number: 25

## 2022-05-17 ENCOUNTER — Other Ambulatory Visit: Payer: Self-pay

## 2022-05-17 ENCOUNTER — Ambulatory Visit
Admission: RE | Admit: 2022-05-17 | Discharge: 2022-05-17 | Disposition: A | Discharge status: Home / Self Care | Payer: Medicare Other | Source: Ambulatory Visit | Attending: Radiation Oncology | Admitting: Radiation Oncology

## 2022-05-17 ENCOUNTER — Inpatient Hospital Stay: Payer: Medicare Other

## 2022-05-17 VITALS — BP 140/55 | HR 72 | Temp 98.7°F | Resp 18

## 2022-05-17 DIAGNOSIS — Z510 Encounter for antineoplastic radiation therapy: Secondary | ICD-10-CM | POA: Diagnosis not present

## 2022-05-17 DIAGNOSIS — C678 Malignant neoplasm of overlapping sites of bladder: Secondary | ICD-10-CM

## 2022-05-17 DIAGNOSIS — C672 Malignant neoplasm of lateral wall of bladder: Secondary | ICD-10-CM | POA: Diagnosis not present

## 2022-05-17 LAB — RAD ONC ARIA SESSION SUMMARY
Course Elapsed Days: 35
Plan Fractions Treated to Date: 15
Plan Prescribed Dose Per Fraction: 1.8 Gy
Plan Total Fractions Prescribed: 25
Plan Total Prescribed Dose: 45 Gy
Reference Point Dosage Given to Date: 27 Gy
Reference Point Session Dosage Given: 1.8 Gy
Session Number: 26

## 2022-05-17 MED ORDER — HEPARIN SOD (PORK) LOCK FLUSH 100 UNIT/ML IV SOLN
500.0000 [IU] | Freq: Once | INTRAVENOUS | Status: AC | PRN
  Administered 2022-05-17: 500 [IU]

## 2022-05-17 MED ORDER — SODIUM CHLORIDE 0.9% FLUSH
10.0000 mL | INTRAVENOUS | Status: DC | PRN
  Administered 2022-05-17: 10 mL

## 2022-05-17 NOTE — Patient Instructions (Signed)
You had your pump d/c today.  Follow up as scheduled.  

## 2022-05-17 NOTE — Progress Notes (Signed)
Patient here today for pump d/c.  He reports no issues with pump during his treatment.  He arrived and pump was stopped.  250 ml infused and zero ml refused.  Blood return noted and port flushed without incidence.  Patient remained stable during flush.  He was discharged ambulatory and in stable condition.  He is aware of his follow up appointments.

## 2022-05-18 ENCOUNTER — Other Ambulatory Visit: Payer: Self-pay

## 2022-05-18 ENCOUNTER — Ambulatory Visit
Admission: RE | Admit: 2022-05-18 | Discharge: 2022-05-18 | Disposition: A | Discharge status: Home / Self Care | Payer: Medicare Other | Source: Ambulatory Visit | Attending: Radiation Oncology | Admitting: Radiation Oncology

## 2022-05-18 DIAGNOSIS — Z510 Encounter for antineoplastic radiation therapy: Secondary | ICD-10-CM | POA: Diagnosis not present

## 2022-05-18 DIAGNOSIS — C672 Malignant neoplasm of lateral wall of bladder: Secondary | ICD-10-CM | POA: Diagnosis not present

## 2022-05-18 LAB — RAD ONC ARIA SESSION SUMMARY
Course Elapsed Days: 36
Plan Fractions Treated to Date: 16
Plan Prescribed Dose Per Fraction: 1.8 Gy
Plan Total Fractions Prescribed: 25
Plan Total Prescribed Dose: 45 Gy
Reference Point Dosage Given to Date: 28.8 Gy
Reference Point Session Dosage Given: 1.8 Gy
Session Number: 27

## 2022-05-19 ENCOUNTER — Other Ambulatory Visit: Payer: Self-pay

## 2022-05-19 ENCOUNTER — Ambulatory Visit
Admission: RE | Admit: 2022-05-19 | Discharge: 2022-05-19 | Disposition: A | Discharge status: Home / Self Care | Payer: Medicare Other | Source: Ambulatory Visit | Attending: Radiation Oncology | Admitting: Radiation Oncology

## 2022-05-19 DIAGNOSIS — C672 Malignant neoplasm of lateral wall of bladder: Secondary | ICD-10-CM | POA: Diagnosis not present

## 2022-05-19 DIAGNOSIS — Z510 Encounter for antineoplastic radiation therapy: Secondary | ICD-10-CM | POA: Diagnosis not present

## 2022-05-19 LAB — RAD ONC ARIA SESSION SUMMARY
Course Elapsed Days: 37
Plan Fractions Treated to Date: 17
Plan Prescribed Dose Per Fraction: 1.8 Gy
Plan Total Fractions Prescribed: 25
Plan Total Prescribed Dose: 45 Gy
Reference Point Dosage Given to Date: 30.6 Gy
Reference Point Session Dosage Given: 1.8 Gy
Session Number: 28

## 2022-05-20 ENCOUNTER — Ambulatory Visit
Admission: RE | Admit: 2022-05-20 | Discharge: 2022-05-20 | Disposition: A | Discharge status: Home / Self Care | Payer: Medicare Other | Source: Ambulatory Visit | Attending: Radiation Oncology | Admitting: Radiation Oncology

## 2022-05-20 ENCOUNTER — Other Ambulatory Visit: Payer: Self-pay

## 2022-05-20 DIAGNOSIS — Z510 Encounter for antineoplastic radiation therapy: Secondary | ICD-10-CM | POA: Diagnosis not present

## 2022-05-20 DIAGNOSIS — C672 Malignant neoplasm of lateral wall of bladder: Secondary | ICD-10-CM | POA: Diagnosis not present

## 2022-05-20 LAB — RAD ONC ARIA SESSION SUMMARY
Course Elapsed Days: 38
Plan Fractions Treated to Date: 18
Plan Prescribed Dose Per Fraction: 1.8 Gy
Plan Total Fractions Prescribed: 25
Plan Total Prescribed Dose: 45 Gy
Reference Point Dosage Given to Date: 32.4 Gy
Reference Point Session Dosage Given: 1.8 Gy
Session Number: 29

## 2022-05-21 ENCOUNTER — Other Ambulatory Visit: Payer: Self-pay

## 2022-05-21 ENCOUNTER — Ambulatory Visit
Admission: RE | Admit: 2022-05-21 | Discharge: 2022-05-21 | Disposition: A | Discharge status: Home / Self Care | Payer: Medicare Other | Source: Ambulatory Visit | Attending: Radiation Oncology | Admitting: Radiation Oncology

## 2022-05-21 DIAGNOSIS — Z510 Encounter for antineoplastic radiation therapy: Secondary | ICD-10-CM | POA: Diagnosis not present

## 2022-05-21 DIAGNOSIS — C672 Malignant neoplasm of lateral wall of bladder: Secondary | ICD-10-CM | POA: Diagnosis not present

## 2022-05-21 LAB — RAD ONC ARIA SESSION SUMMARY
Course Elapsed Days: 39
Plan Fractions Treated to Date: 19
Plan Prescribed Dose Per Fraction: 1.8 Gy
Plan Total Fractions Prescribed: 25
Plan Total Prescribed Dose: 45 Gy
Reference Point Dosage Given to Date: 34.2 Gy
Reference Point Session Dosage Given: 1.8 Gy
Session Number: 30

## 2022-05-24 ENCOUNTER — Ambulatory Visit
Admission: RE | Admit: 2022-05-24 | Discharge: 2022-05-24 | Disposition: A | Discharge status: Home / Self Care | Payer: Medicare Other | Source: Ambulatory Visit | Attending: Radiation Oncology | Admitting: Radiation Oncology

## 2022-05-24 ENCOUNTER — Other Ambulatory Visit: Payer: Self-pay

## 2022-05-24 DIAGNOSIS — C672 Malignant neoplasm of lateral wall of bladder: Secondary | ICD-10-CM | POA: Diagnosis not present

## 2022-05-24 DIAGNOSIS — Z510 Encounter for antineoplastic radiation therapy: Secondary | ICD-10-CM | POA: Diagnosis not present

## 2022-05-24 LAB — RAD ONC ARIA SESSION SUMMARY
Course Elapsed Days: 42
Plan Fractions Treated to Date: 20
Plan Prescribed Dose Per Fraction: 1.8 Gy
Plan Total Fractions Prescribed: 25
Plan Total Prescribed Dose: 45 Gy
Reference Point Dosage Given to Date: 36 Gy
Reference Point Session Dosage Given: 1.8 Gy
Session Number: 31

## 2022-05-25 ENCOUNTER — Ambulatory Visit
Admission: RE | Admit: 2022-05-25 | Discharge: 2022-05-25 | Disposition: A | Discharge status: Home / Self Care | Payer: Medicare Other | Source: Ambulatory Visit | Attending: Radiation Oncology | Admitting: Radiation Oncology

## 2022-05-25 ENCOUNTER — Other Ambulatory Visit: Payer: Self-pay

## 2022-05-25 DIAGNOSIS — C672 Malignant neoplasm of lateral wall of bladder: Secondary | ICD-10-CM | POA: Diagnosis not present

## 2022-05-25 DIAGNOSIS — Z510 Encounter for antineoplastic radiation therapy: Secondary | ICD-10-CM | POA: Diagnosis not present

## 2022-05-25 LAB — RAD ONC ARIA SESSION SUMMARY
Course Elapsed Days: 43
Plan Fractions Treated to Date: 21
Plan Prescribed Dose Per Fraction: 1.8 Gy
Plan Total Fractions Prescribed: 25
Plan Total Prescribed Dose: 45 Gy
Reference Point Dosage Given to Date: 37.8 Gy
Reference Point Session Dosage Given: 1.8 Gy
Session Number: 32

## 2022-05-26 ENCOUNTER — Ambulatory Visit
Admission: RE | Admit: 2022-05-26 | Discharge: 2022-05-26 | Disposition: A | Discharge status: Home / Self Care | Payer: Medicare Other | Source: Ambulatory Visit | Attending: Radiation Oncology | Admitting: Radiation Oncology

## 2022-05-26 ENCOUNTER — Other Ambulatory Visit: Payer: Self-pay

## 2022-05-26 DIAGNOSIS — C672 Malignant neoplasm of lateral wall of bladder: Secondary | ICD-10-CM | POA: Diagnosis not present

## 2022-05-26 DIAGNOSIS — Z510 Encounter for antineoplastic radiation therapy: Secondary | ICD-10-CM | POA: Diagnosis not present

## 2022-05-26 LAB — RAD ONC ARIA SESSION SUMMARY
Course Elapsed Days: 44
Plan Fractions Treated to Date: 22
Plan Prescribed Dose Per Fraction: 1.8 Gy
Plan Total Fractions Prescribed: 25
Plan Total Prescribed Dose: 45 Gy
Reference Point Dosage Given to Date: 39.6 Gy
Reference Point Session Dosage Given: 1.8 Gy
Session Number: 33

## 2022-05-27 ENCOUNTER — Ambulatory Visit
Admission: RE | Admit: 2022-05-27 | Discharge: 2022-05-27 | Disposition: A | Discharge status: Home / Self Care | Payer: Medicare Other | Source: Ambulatory Visit | Attending: Radiation Oncology | Admitting: Radiation Oncology

## 2022-05-27 ENCOUNTER — Other Ambulatory Visit: Payer: Self-pay

## 2022-05-27 DIAGNOSIS — C672 Malignant neoplasm of lateral wall of bladder: Secondary | ICD-10-CM | POA: Diagnosis not present

## 2022-05-27 DIAGNOSIS — Z510 Encounter for antineoplastic radiation therapy: Secondary | ICD-10-CM | POA: Diagnosis not present

## 2022-05-27 LAB — RAD ONC ARIA SESSION SUMMARY
Course Elapsed Days: 45
Plan Fractions Treated to Date: 23
Plan Prescribed Dose Per Fraction: 1.8 Gy
Plan Total Fractions Prescribed: 25
Plan Total Prescribed Dose: 45 Gy
Reference Point Dosage Given to Date: 41.4 Gy
Reference Point Session Dosage Given: 1.8 Gy
Session Number: 34

## 2022-05-28 ENCOUNTER — Other Ambulatory Visit: Payer: Self-pay

## 2022-05-28 ENCOUNTER — Ambulatory Visit
Admission: RE | Admit: 2022-05-28 | Discharge: 2022-05-28 | Disposition: A | Discharge status: Home / Self Care | Payer: Medicare Other | Source: Ambulatory Visit | Attending: Radiation Oncology | Admitting: Radiation Oncology

## 2022-05-28 DIAGNOSIS — C672 Malignant neoplasm of lateral wall of bladder: Secondary | ICD-10-CM | POA: Diagnosis not present

## 2022-05-28 DIAGNOSIS — Z510 Encounter for antineoplastic radiation therapy: Secondary | ICD-10-CM | POA: Diagnosis not present

## 2022-05-28 LAB — RAD ONC ARIA SESSION SUMMARY
Course Elapsed Days: 46
Plan Fractions Treated to Date: 24
Plan Prescribed Dose Per Fraction: 1.8 Gy
Plan Total Fractions Prescribed: 25
Plan Total Prescribed Dose: 45 Gy
Reference Point Dosage Given to Date: 43.2 Gy
Reference Point Session Dosage Given: 1.8 Gy
Session Number: 35

## 2022-05-31 ENCOUNTER — Other Ambulatory Visit: Payer: Self-pay

## 2022-05-31 ENCOUNTER — Ambulatory Visit
Admission: RE | Admit: 2022-05-31 | Discharge: 2022-05-31 | Disposition: A | Discharge status: Home / Self Care | Payer: Medicare Other | Source: Ambulatory Visit | Attending: Radiation Oncology | Admitting: Radiation Oncology

## 2022-05-31 DIAGNOSIS — C672 Malignant neoplasm of lateral wall of bladder: Secondary | ICD-10-CM | POA: Diagnosis not present

## 2022-05-31 DIAGNOSIS — Z510 Encounter for antineoplastic radiation therapy: Secondary | ICD-10-CM | POA: Diagnosis not present

## 2022-05-31 LAB — RAD ONC ARIA SESSION SUMMARY
Course Elapsed Days: 49
Plan Fractions Treated to Date: 25
Plan Prescribed Dose Per Fraction: 1.8 Gy
Plan Total Fractions Prescribed: 25
Plan Total Prescribed Dose: 45 Gy
Reference Point Dosage Given to Date: 45 Gy
Reference Point Session Dosage Given: 1.8 Gy
Session Number: 36

## 2022-06-01 NOTE — Progress Notes (Signed)
[facility] [address], [place] [address]    Clinic Day:  06/02/2022  Referring physician: Assunta Found, MD  Patient Care Team: Assunta Found, MD as PCP - General (Family Medicine) [name], MD as PCP - Cardiology (Cardiology) Steve Daily, RN as [facility] Care Management Steve Massed, MD as Medical Oncologist (Medical Oncology) Therese Sarah, RN as Oncology Nurse Navigator (Medical Oncology)   ASSESSMENT & PLAN:   Assessment: 1.  T2 N0 bladder cancer (multifocal): - 12/16/2014: Initial diagnosis, malignant neoplasm of lateral wall of bladder - 03/14/2018: TURBT (pathology-invasive papillary urothelial carcinoma, high-grade, tumor involves lamina propria, muscularis propria present, not involved by tumor) - 05/04/2018 - 06/13/2018: Initial BCG induction course - 08/22/2018: Bladder biopsy HG Ta, multifocal - 08/29/2018 - 10/01/2018: Intravesicular valrubicin - 02/20/2019: Bladder biopsy-benign - 09/15/2019: TURBT- HG Ta, muscle present - 10/24/2019 - 11/28/2019: Completion of BCG induction course x 6 - 05/14/2020: TURBT-HG Ta, muscle present - 07/02/2020 - 08/20/2020: Intravesical gemcitabine/docetaxel - 03/10/2021: TURBT-HG Ta + CIS, muscle present - 04/29/2021 - 06/03/2021: Reinduction intravesical gemcitabine/docetaxel - 07/06/2021: Cystoscopy without any visible tumors, cytology with high-grade urothelial carcinoma - 12/30/2021: Progression with lesions throughout the bladder, muscle invasive at left lateral wall - He has chronic neuropathy, CKD which precludes him from cisplatin-based neoadjuvant chemotherapy followed by cystectomy. - He met with radiation oncology at [facility].  Due to multifocal nature of bladder cancer, they recommended him to go for surgery.  He preferred trimodality therapy. - Cystoscopy and TURBT (03/24/2022): At [facility] with specimens taken from right posterior wall bladder tumor, left anterior dome bladder tumor  and left lateral wall scar.  Pathology shows urothelial carcinoma in situ with no evidence of invasion in all 3 specimens.  Muscularis propria present, uninvolved by malignancy. - XRT started on 04/12/2022, cycle 1 of 5-FU and mitomycin on 04/14/2022.  XRT completed on 05/31/2022.   2.  Social/family history: - He lives at home by himself.  He is seen today with his daughter.  He is independent of ADLs and IADLs.  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  He dances 1-2 times per week.  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  Quit smoking 50 years ago. - Father had prostate cancer.  Maternal grandmother had cancer, type unknown to the patient.    Plan: 1.  T2 N0 multifocal bladder cancer: - He completed XRT on 05/31/2022. - Energy levels are slowly improving. - Reviewed labs today: Creatinine 1.43 and normal LFTs.  CBC with hemoglobin 8.9.  White count is improving at 3.4 with normal ANC. - He reports urinary urgency.  He is on Myrbetriq 25 mg Mack.  He will reach out to Dr. Ronne Binning for titration. - Recommend follow-up in 2 months with CTAP with contrast.   2.  Normocytic to microcytic anemia: - He received Feraheme on 02/16/2022.  Latest ferritin is 363 with percent saturation 38.  Hemoglobin is 8.9, likely from myelosuppression.  Will plan to repeat labs in 2 months.    Orders Placed This Encounter  Procedures   CT Abdomen Pelvis W Contrast    Standing Status:   Future    Standing Expiration Date:   06/02/2023    Order Specific Question:   If indicated for the ordered procedure, I authorize the administration of contrast media per Radiology protocol    Answer:   Yes    Order Specific Question:   Does the patient have  a contrast media/X-ray dye allergy?    Answer:   No    Order Specific Question:   Preferred imaging location?    Answer:   [facility]    Order Specific Question:   If indicated for the ordered procedure, I authorize the administration of oral  contrast media per Radiology protocol    Answer:   Yes      I,Katie Daubenspeck,acting as a scribe for Steve Massed, MD.,have documented all relevant documentation on the behalf of Steve Massed, MD,as directed by  Steve Massed, MD while in the presence of Steve Massed, MD.   I, Steve Massed MD, have reviewed the above documentation for accuracy and completeness, and I agree with the above.   Steve Massed, MD   5/22/20246:16 PM  CHIEF COMPLAINT:   Diagnosis: T2 N0 bladder cancer    Cancer Staging  Bladder cancer [facility]) Staging form: Urinary Bladder, AJCC 8th Edition - Clinical stage from 01/29/2022: Stage II (cT2, cN0, cM0) - Unsigned    Prior Therapy: none  Current Therapy:  Trimodality therapy    HISTORY OF PRESENT ILLNESS:   Oncology History  Bladder cancer (HCC)  01/29/2022 Initial Diagnosis   Bladder cancer (HCC)   04/14/2022 -  Chemotherapy   Patient is on Treatment Plan : BLADDER Mitomycin D1 + 5FU D1-5, 21-25 + XRT        INTERVAL HISTORY:   Greysun is a 77 y.o. male presenting to clinic today for follow up of T2 N0 bladder cancer. He was last seen by me on 05/12/22.  He completed radiation therapy this past Monday, 05/31/22.  Today, he states that he is doing well overall. His appetite level is at 100%. His energy level is at 75%.  PAST MEDICAL HISTORY:   Past Medical History: Past Medical History:  Diagnosis Date   Anginal pain (HCC)    Arthritis    "all over"   Bladder tumor    Chronic lower back pain    Coronary artery disease    a. s/p CABG x 2 (LIMA->LAD, RIMA->RCA);  b. s/p multiple PCI's to Ramus;  c. 08/2011 Cath/PCI: LM 70% into ramus with 70-80% there->treated wtih 3.5x18 Xience Xpedition DES, LCX  nonobs, RCA occluded.  RIMA & LIMA patent, EF 55-65%   DJD (degenerative joint disease)    GERD (gastroesophageal reflux disease)    History of gout    Hyperlipidemia    Hypertension    Hypothyroidism     Neuropathy    OSA on CPAP    Prostate cancer [facility])     Surgical History: Past Surgical History:  Procedure Laterality Date   ABDOMINAL HERNIA REPAIR     BIOPSY  11/26/2015   Procedure: BIOPSY;  Surgeon: Malissa Hippo, MD;  [facility];  Service: Endoscopy;;  gastric esophagus   BIOPSY  01/20/2021   Procedure: BIOPSY;  Surgeon: Dolores Frame, MD;  [facility];  Service: Gastroenterology;;   BIOPSY  10/28/2021   Procedure: BIOPSY;  Surgeon: Dolores Frame, MD;  [facility];  Service: Gastroenterology;;   BLADDER TUMOR EXCISION     BROW LIFT Bilateral 04/27/2019   Procedure: BILATERAL BLEPHAROPLASTY;  Surgeon: Fabio Pierce, MD;  Location: [facility];  Service: Ophthalmology;  Laterality: Bilateral;   CARDIAC CATHETERIZATION N/A 04/10/2015   Procedure: Left Heart Cath and Cors/Grafts Angiography;  Surgeon: [name], MD;  Location: [facility] INVASIVE CV LAB;  Service: Cardiovascular;  Laterality: N/A;   CARDIAC CATHETERIZATION N/A  04/10/2015   Procedure: Coronary Stent Intervention;  Surgeon: [name], MD;  Location: [facility] INVASIVE CV LAB;  Service: Cardiovascular;  Laterality: N/A;   CATARACT EXTRACTION W/PHACO Right 06/23/2015   Procedure: CATARACT EXTRACTION PHACO AND INTRAOCULAR LENS PLACEMENT RIGHT EYE; CDE:  7.08;  Surgeon: Gemma Payor, MD;  Location: [facility];  Service: Ophthalmology;  Laterality: Right;   CATARACT EXTRACTION W/PHACO Left 08/25/2015   Procedure: CATARACT EXTRACTION PHACO AND INTRAOCULAR LENS PLACEMENT LEFT EYE; CDE:  8.18;  Surgeon: Gemma Payor, MD;  Location: [facility];  Service: Ophthalmology;  Laterality: Left;   COLONOSCOPY N/A 12/28/2012   Procedure: COLONOSCOPY;  Surgeon: Malissa Hippo, MD;  [facility];  Service: Endoscopy;  Laterality: N/A;  830 rescheduled   COLONOSCOPY WITH PROPOFOL N/A 01/20/2021   Procedure: COLONOSCOPY WITH PROPOFOL;  Surgeon: Dolores Frame, MD;  [facility];  Service: Gastroenterology;  Laterality: N/A;  11:05   CORONARY ANGIOPLASTY WITH STENT PLACEMENT  2013; 04/10/2015   "this makes me a total of 7" (04/10/2015)   CORONARY ARTERY BYPASS GRAFT  1997   with (LIMA)   ESOPHAGEAL DILATION N/A 11/26/2015   Procedure: ESOPHAGEAL DILATION;  Surgeon: Malissa Hippo, MD;  [facility];  Service: Endoscopy;  Laterality: N/A;   ESOPHAGEAL DILATION N/A 05/23/2017   Procedure: ESOPHAGEAL DILATION;  Surgeon: Malissa Hippo, MD;  [facility];  Service: Endoscopy;  Laterality: N/A;   ESOPHAGEAL DILATION N/A 06/16/2017   Procedure: ESOPHAGEAL DILATION;  Surgeon: Malissa Hippo, MD;  [facility];  Service: Endoscopy;  Laterality: N/A;   ESOPHAGEAL DILATION  10/28/2021   Procedure: ESOPHAGEAL DILATION;  Surgeon: Dolores Frame, MD;  [facility];  Service: Gastroenterology;;   ESOPHAGOGASTRODUODENOSCOPY N/A 11/26/2015   Procedure: ESOPHAGOGASTRODUODENOSCOPY (EGD);  Surgeon: Malissa Hippo, MD;  [facility];  Service: Endoscopy;  Laterality: N/A;  1:25   ESOPHAGOGASTRODUODENOSCOPY N/A 05/23/2017   Procedure: ESOPHAGOGASTRODUODENOSCOPY (EGD);  Surgeon: Malissa Hippo, MD;  [facility];  Service: Endoscopy;  Laterality: N/A;  1:15   ESOPHAGOGASTRODUODENOSCOPY N/A 06/16/2017   Procedure: ESOPHAGOGASTRODUODENOSCOPY (EGD);  Surgeon: Malissa Hippo, MD;  [facility];  Service: Endoscopy;  Laterality: N/A;  1240   ESOPHAGOGASTRODUODENOSCOPY (EGD) WITH PROPOFOL N/A 01/20/2021   Procedure: ESOPHAGOGASTRODUODENOSCOPY (EGD) WITH PROPOFOL;  Surgeon: Dolores Frame, MD;  [facility];  Service: Gastroenterology;  Laterality: N/A;   ESOPHAGOGASTRODUODENOSCOPY (EGD) WITH PROPOFOL N/A 10/28/2021   Procedure: ESOPHAGOGASTRODUODENOSCOPY (EGD) WITH PROPOFOL;  Surgeon: Dolores Frame, MD;  [facility];  Service: Gastroenterology;  Laterality:  N/A;  215 ASA 2, pt will arrive at 10:45   HERNIA REPAIR Left    IR IMAGING GUIDED PORT INSERTION  02/24/2022   KNEE CARTILAGE SURGERY Bilateral    LEFT HEART CATH AND CORS/GRAFTS ANGIOGRAPHY N/A 09/08/2016   Procedure: LEFT HEART CATH AND CORS/GRAFTS ANGIOGRAPHY;  Surgeon: Swaziland, Peter M, MD;  Location: [facility] INVASIVE CV LAB;  Service: Cardiovascular;  Laterality: N/A;   LEFT HEART CATHETERIZATION WITH CORONARY ANGIOGRAM N/A 09/08/2011   Procedure: LEFT HEART CATHETERIZATION WITH CORONARY ANGIOGRAM;  Surgeon: [name], MD;  Location: [facility] CATH LAB;  Service: Cardiovascular;  Laterality: N/A;   PERCUTANEOUS CORONARY STENT INTERVENTION (PCI-S) Right 09/08/2011   Procedure: PERCUTANEOUS CORONARY STENT INTERVENTION (PCI-S);  Surgeon: [name], MD;  Location: [facility] CATH LAB;  Service: Cardiovascular;  Laterality: Right;   POLYPECTOMY  01/20/2021   Procedure: POLYPECTOMY;  Surgeon:  Dolores Frame, MD;  [facility];  Service: Gastroenterology;;   ROBOT ASSISTED LAPAROSCOPIC RADICAL PROSTATECTOMY      Social History: Social History   Socioeconomic History   Marital status: Widowed    Spouse name: Not on file   Number of children: Not on file   Years of education: Not on file   Highest education level: Not on file  Occupational History   Occupation: semi-retired  Tobacco Use   Smoking status: Former    Types: Cigars    Passive exposure: Past   Smokeless tobacco: Never   Tobacco comments:    "quit smoking in ~ 1978"  Vaping Use   Vaping Use: Never used  Substance and Sexual Activity   Alcohol use: Yes    Comment: occasionally    Drug use: No   Sexual activity: Not Currently  Other Topics Concern   Not on file  Social History Narrative   Not on file   Social Determinants of Health   Financial Resource Strain: Low Risk  (05/06/2022)   Overall Financial Resource Strain (CARDIA)    Difficulty of Paying Living Expenses: Not hard at all  Food Insecurity: No  Food Insecurity (02/04/2022)   Hunger Vital Sign    Worried About Running Out of Food in the Last Year: Never true    Ran Out of Food in the Last Year: Never true  Transportation Needs: No Transportation Needs (05/06/2022)   PRAPARE - Administrator, Civil Service (Medical): No    Lack of Transportation (Non-Medical): No  Physical Activity: Not on file  Stress: Not on file  Social Connections: Not on file  Intimate Partner Violence: Not At Risk (02/04/2022)   Humiliation, Afraid, Rape, and Kick questionnaire    Fear of Current or Ex-Partner: No    Emotionally Abused: No    Physically Abused: No    Sexually Abused: No    Family History: Family History  Problem Relation Age of Onset   Cancer Father 7       died   Stroke Mother 76       died    Current Medications:  Current Outpatient Medications:    amLODipine (NORVASC) 10 MG tablet, Take 10 mg by mouth in the morning., Disp: , Rfl:    aspirin EC 81 MG tablet, Take 81 mg by mouth every evening., Disp: 30 tablet, Rfl:    atorvastatin (LIPITOR) 40 MG tablet, Take 0.5 tablets (20 mg total) by mouth every evening., Disp: 45 tablet, Rfl: 3   carvedilol (COREG) 6.25 MG tablet, Take 1 tablet (6.25 mg total) by mouth 2 (two) times Mack., Disp: 180 tablet, Rfl: 3   cetirizine (ZYRTEC) 10 MG tablet, Take 10 mg by mouth every evening., Disp: , Rfl:    cholecalciferol (VITAMIN D3) 25 MCG (1000 UNIT) tablet, Take 1,000 Units by mouth every evening., Disp: , Rfl:    clopidogrel (PLAVIX) 75 MG tablet, Take 1 tablet (75 mg total) by mouth in the morning., Disp: 90 tablet, Rfl: 3   Coenzyme Q10 300 MG CAPS, Take 300 mg by mouth every evening., Disp: , Rfl:    cyanocobalamin (,VITAMIN B-12,) 1000 MCG/ML injection, Inject 1,000 mcg into the muscle See admin instructions. Inject (1000 mcg) intramuscularly once monthly as needed for B-12 deficiency., Disp: , Rfl: 2   fenofibrate 160 MG tablet, Take 1 tablet (160 mg total) by mouth Mack.  (Patient taking differently: Take 160 mg by mouth every evening.), Disp: 30  tablet, Rfl: 11   FEROSUL 325 (65 Fe) MG tablet, TAKE 1 TABLET(325 MG) BY MOUTH TWICE Mack WITH A MEAL, Disp: 180 tablet, Rfl: 3   gabapentin (NEURONTIN) 600 MG tablet, Take 600 mg by mouth 3 (three) times Mack., Disp: , Rfl: 3   GLUCOSAMINE-CHONDROITIN PO, Take 1 tablet by mouth every evening., Disp: , Rfl:    isosorbide mononitrate (IMDUR) 60 MG 24 hr tablet, Take 1.5 tablets (90 mg total) by mouth Mack. (Patient taking differently: Take 90 mg by mouth every evening.), Disp: 135 tablet, Rfl: 3   levothyroxine (SYNTHROID, LEVOTHROID) 150 MCG tablet, Take 150 mcg by mouth Mack before breakfast., Disp: , Rfl:    lidocaine-prilocaine (EMLA) cream, Apply to affected area once, Disp: 30 g, Rfl: 3   meclizine (ANTIVERT) 25 MG tablet, Take 1 tablet (25 mg total) by mouth 2 (two) times Mack as needed for dizziness. (Patient taking differently: Take 12.5 mg by mouth at bedtime as needed for dizziness.), Disp: 30 tablet, Rfl: 0   mirabegron ER (MYRBETRIQ) 25 MG TB24 tablet, Take 1 tablet (25 mg total) by mouth Mack., Disp: 30 tablet, Rfl: 0   nitroGLYCERIN (NITROSTAT) 0.4 MG SL tablet, Place 1 tablet (0.4 mg total) under the tongue every 5 (five) minutes as needed for chest pain., Disp: 25 tablet, Rfl: 11   Polyethyl Glycol-Propyl Glycol (LUBRICANT EYE DROPS) 0.4-0.3 % SOLN, Place 1-2 drops into both eyes 3 (three) times Mack as needed (dry/irritated eyes.)., Disp: , Rfl:    prochlorperazine (COMPAZINE) 10 MG tablet, Take 1 tablet (10 mg total) by mouth every 6 (six) hours as needed for nausea or vomiting., Disp: 30 tablet, Rfl: 1   traMADol (ULTRAM) 50 MG tablet, Take 50 mg by mouth 3 (three) times Mack., Disp: , Rfl: 1   Allergies: Allergies  Allergen Reactions   Codeine Nausea Only   Pollen Extract Other (See Comments)    Seasonal    REVIEW OF SYSTEMS:   Review of Systems  Constitutional:  Negative for chills,  fatigue and fever.  HENT:   Negative for lump/mass, mouth sores, nosebleeds, sore throat and trouble swallowing.   Eyes:  Negative for eye problems.  Respiratory:  Negative for cough and shortness of breath.   Cardiovascular:  Negative for chest pain, leg swelling and palpitations.  Gastrointestinal:  Negative for abdominal pain, constipation, diarrhea, nausea and vomiting.  Genitourinary:  Negative for bladder incontinence, difficulty urinating, dysuria, frequency, hematuria and nocturia.   Musculoskeletal:  Negative for arthralgias, back pain, flank pain, myalgias and neck pain.  Skin:  Negative for itching and rash.  Neurological:  Positive for dizziness and numbness. Negative for headaches.  Hematological:  Does not bruise/bleed easily.  Psychiatric/Behavioral:  Negative for depression, sleep disturbance and suicidal ideas. The patient is not nervous/anxious.   All other systems reviewed and are negative.    VITALS:   There were no vitals taken for this visit.  Wt Readings from Last 3 Encounters:  06/02/22 213 lb 3.2 oz (96.7 kg)  05/12/22 221 lb 12.8 oz (100.6 kg)  05/12/22 217 lb (98.4 kg)    There is no height or weight on file to calculate BMI.  Performance status (ECOG): 1 - Symptomatic but completely ambulatory  PHYSICAL EXAM:   Physical Exam Vitals and nursing note reviewed. Exam conducted with a chaperone present.  Constitutional:      Appearance: Normal appearance.  Cardiovascular:     Rate and Rhythm: Normal rate and regular rhythm.  Pulses: Normal pulses.     Heart sounds: Normal heart sounds.  Pulmonary:     Effort: Pulmonary effort is normal.     Breath sounds: Normal breath sounds.  Abdominal:     Palpations: Abdomen is soft. There is no hepatomegaly, splenomegaly or mass.     Tenderness: There is no abdominal tenderness.  Musculoskeletal:     Right lower leg: No edema.     Left lower leg: No edema.  Lymphadenopathy:     Cervical: No cervical  adenopathy.     Right cervical: No superficial, deep or posterior cervical adenopathy.    Left cervical: No superficial, deep or posterior cervical adenopathy.     Upper Body:     Right upper body: No supraclavicular or axillary adenopathy.     Left upper body: No supraclavicular or axillary adenopathy.  Neurological:     General: No focal deficit present.     Mental Status: He is alert and oriented to person, place, and time.  Psychiatric:        Mood and Affect: Mood normal.        Behavior: Behavior normal.     LABS:      Latest Ref Rng & Units 06/02/2022   11:07 AM 05/12/2022    9:21 AM 04/28/2022    9:45 AM  CBC  WBC 4.0 - 10.5 K/uL 3.4  3.0  3.1   Hemoglobin 13.0 - 17.0 g/dL 8.9  9.6  9.8   Hematocrit 39.0 - 52.0 % 27.0  28.6  29.5   Platelets 150 - 400 K/uL 199  97  107       Latest Ref Rng & Units 06/02/2022   11:07 AM 05/12/2022    9:21 AM 04/28/2022    9:45 AM  CMP  Glucose 70 - 99 mg/dL 962  952  841   BUN 8 - 23 mg/dL 23  22  22    Creatinine 0.61 - 1.24 mg/dL 3.24  4.01  0.27   Sodium 135 - 145 mmol/L 137  138  138   Potassium 3.5 - 5.1 mmol/L 3.9  3.7  3.7   Chloride 98 - 111 mmol/L 102  105  105   CO2 22 - 32 mmol/L 26  27  26    Calcium 8.9 - 10.3 mg/dL 8.5  8.8  8.6   Total Protein 6.5 - 8.1 g/dL 6.8  6.5  6.6   Total Bilirubin 0.3 - 1.2 mg/dL 0.3  0.7  0.7   Alkaline Phos 38 - 126 U/L 46  39  41   AST 15 - 41 U/L 24  21  21    ALT 0 - 44 U/L 22  21  24       No results found for: "CEA1", "CEA" / No results found for: "CEA1", "CEA" No results found for: "PSA1" No results found for: "OZD664" No results found for: "CAN125"  No results found for: "TOTALPROTELP", "ALBUMINELP", "A1GS", "A2GS", "BETS", "BETA2SER", "GAMS", "MSPIKE", "SPEI" Lab Results  Component Value Date   TIBC 326 06/02/2022   TIBC 398 02/04/2022   TIBC 360 12/14/2021   FERRITIN 363 (H) 06/02/2022   FERRITIN 93 02/04/2022   FERRITIN 79 12/14/2021   IRONPCTSAT 38 06/02/2022   IRONPCTSAT  32 02/04/2022   IRONPCTSAT 36 12/14/2021   No results found for: "LDH"   STUDIES:   No results found.

## 2022-06-02 ENCOUNTER — Inpatient Hospital Stay: Payer: Medicare Other

## 2022-06-02 ENCOUNTER — Inpatient Hospital Stay (HOSPITAL_BASED_OUTPATIENT_CLINIC_OR_DEPARTMENT_OTHER): Payer: Medicare Other | Admitting: Hematology

## 2022-06-02 ENCOUNTER — Telehealth: Payer: Self-pay

## 2022-06-02 VITALS — BP 133/54 | HR 64 | Temp 98.2°F | Resp 20 | Wt 213.2 lb

## 2022-06-02 DIAGNOSIS — Z95828 Presence of other vascular implants and grafts: Secondary | ICD-10-CM

## 2022-06-02 DIAGNOSIS — C672 Malignant neoplasm of lateral wall of bladder: Secondary | ICD-10-CM | POA: Diagnosis not present

## 2022-06-02 DIAGNOSIS — C678 Malignant neoplasm of overlapping sites of bladder: Secondary | ICD-10-CM | POA: Diagnosis not present

## 2022-06-02 DIAGNOSIS — D509 Iron deficiency anemia, unspecified: Secondary | ICD-10-CM

## 2022-06-02 DIAGNOSIS — Z510 Encounter for antineoplastic radiation therapy: Secondary | ICD-10-CM | POA: Diagnosis not present

## 2022-06-02 LAB — CBC WITH DIFFERENTIAL/PLATELET
Abs Immature Granulocytes: 0.08 10*3/uL — ABNORMAL HIGH (ref 0.00–0.07)
Basophils Absolute: 0 10*3/uL (ref 0.0–0.1)
Basophils Relative: 1 %
Eosinophils Absolute: 0.1 10*3/uL (ref 0.0–0.5)
Eosinophils Relative: 2 %
HCT: 27 % — ABNORMAL LOW (ref 39.0–52.0)
Hemoglobin: 8.9 g/dL — ABNORMAL LOW (ref 13.0–17.0)
Immature Granulocytes: 2 %
Lymphocytes Relative: 12 %
Lymphs Abs: 0.4 10*3/uL — ABNORMAL LOW (ref 0.7–4.0)
MCH: 35.9 pg — ABNORMAL HIGH (ref 26.0–34.0)
MCHC: 33 g/dL (ref 30.0–36.0)
MCV: 108.9 fL — ABNORMAL HIGH (ref 80.0–100.0)
Monocytes Absolute: 0.8 10*3/uL (ref 0.1–1.0)
Monocytes Relative: 25 %
Neutro Abs: 2 10*3/uL (ref 1.7–7.7)
Neutrophils Relative %: 58 %
Platelets: 199 10*3/uL (ref 150–400)
RBC: 2.48 MIL/uL — ABNORMAL LOW (ref 4.22–5.81)
RDW: 16.2 % — ABNORMAL HIGH (ref 11.5–15.5)
WBC: 3.4 10*3/uL — ABNORMAL LOW (ref 4.0–10.5)
nRBC: 0 % (ref 0.0–0.2)

## 2022-06-02 LAB — COMPREHENSIVE METABOLIC PANEL
ALT: 22 U/L (ref 0–44)
AST: 24 U/L (ref 15–41)
Albumin: 3.5 g/dL (ref 3.5–5.0)
Alkaline Phosphatase: 46 U/L (ref 38–126)
Anion gap: 9 (ref 5–15)
BUN: 23 mg/dL (ref 8–23)
CO2: 26 mmol/L (ref 22–32)
Calcium: 8.5 mg/dL — ABNORMAL LOW (ref 8.9–10.3)
Chloride: 102 mmol/L (ref 98–111)
Creatinine, Ser: 1.43 mg/dL — ABNORMAL HIGH (ref 0.61–1.24)
GFR, Estimated: 50 mL/min — ABNORMAL LOW (ref >60)
Glucose, Bld: 123 mg/dL — ABNORMAL HIGH (ref 70–99)
Potassium: 3.9 mmol/L (ref 3.5–5.1)
Sodium: 137 mmol/L (ref 135–145)
Total Bilirubin: 0.3 mg/dL (ref 0.3–1.2)
Total Protein: 6.8 g/dL (ref 6.5–8.1)

## 2022-06-02 LAB — IRON AND TIBC
Iron: 125 ug/dL (ref 45–182)
Saturation Ratios: 38 % (ref 17.9–39.5)
TIBC: 326 ug/dL (ref 250–450)
UIBC: 201 ug/dL

## 2022-06-02 LAB — FERRITIN: Ferritin: 363 ng/mL — ABNORMAL HIGH (ref 24–336)

## 2022-06-02 LAB — MAGNESIUM: Magnesium: 2.1 mg/dL (ref 1.7–2.4)

## 2022-06-02 MED ORDER — HEPARIN SOD (PORK) LOCK FLUSH 100 UNIT/ML IV SOLN
500.0000 [IU] | Freq: Once | INTRAVENOUS | Status: AC
  Administered 2022-06-02: 500 [IU] via INTRAVENOUS

## 2022-06-02 MED ORDER — SODIUM CHLORIDE 0.9% FLUSH
10.0000 mL | Freq: Once | INTRAVENOUS | Status: AC
  Administered 2022-06-02: 10 mL via INTRAVENOUS

## 2022-06-02 NOTE — Progress Notes (Signed)
Port flushed with good blood return noted. No bruising or swelling at site. Bandaid applied and patient discharged in satisfactory condition. VVS stable with no signs or symptoms of distressed noted. 

## 2022-06-02 NOTE — Telephone Encounter (Signed)
Patient needing samples mirabegron ER (MYRBETRIQ) 25 MG TB24 tablet  Helped a little, but needing something stronger.

## 2022-06-02 NOTE — Patient Instructions (Signed)
[facility] Discharge Instructions   You were seen and examined today by Dr. Ellin Saba.  He reviewed the results of your lab work which are normal/stable.   Follow up with Dr. Ronne Binning regarding when he wants to scope your bladder to look inside again.   We will see you back    Thank you for choosing [facility] at [facility] to provide your oncology and hematology care.  To afford each patient quality time with our provider, please arrive at least 15 minutes before your scheduled appointment time.   If you have a lab appointment with the Cancer Center please come in thru the Main Entrance and check in at the main information desk.  You need to re-schedule your appointment should you arrive 10 or more minutes late.  We strive to give you quality time with our providers, and arriving late affects you and other patients whose appointments are after yours.  Also, if you no show three or more times for appointments you may be dismissed from the clinic at the providers discretion.     Again, thank you for choosing [facility].  Our hope is that these requests will decrease the amount of time that you wait before being seen by our physicians.       _____________________________________________________________  Should you have questions after your visit to [facility], please contact our office at [phone] and follow the prompts.  Our office hours are 8:00 a.m. and 4:30 p.m. Monday - Friday.  Please note that voicemails left after 4:00 p.m. may not be returned until the following business day.  We are closed weekends and major holidays.  You do have access to a nurse 24-7, just call the main number to the clinic [phone] and do not press any options, hold on the line and a nurse will answer the phone.    For prescription refill requests, have your pharmacy contact our office and allow 72 hours.    Due  to Covid, you will need to wear a mask upon entering the hospital. If you do not have a mask, a mask will be given to you at the Main Entrance upon arrival. For doctor visits, patients may have 1 support person age 50 or older with them. For treatment visits, patients can not have anyone with them due to social distancing guidelines and our immunocompromised population.

## 2022-06-02 NOTE — Patient Instructions (Signed)
[facility]  Discharge Instructions: Thank you for choosing [facility] to provide your oncology and hematology care.  If you have a lab appointment with the Cancer Center - please note that after April 8th, 2024, all labs will be drawn in the cancer center.  You do not have to check in or register with the main entrance as you have in the past but will complete your check-in in the cancer center.  Wear comfortable clothing and clothing appropriate for easy access to any Portacath or PICC line.   We strive to give you quality time with your provider. You may need to reschedule your appointment if you arrive late (15 or more minutes).  Arriving late affects you and other patients whose appointments are after yours.  Also, if you miss three or more appointments without notifying the office, you may be dismissed from the clinic at the provider's discretion.      For prescription refill requests, have your pharmacy contact our office and allow 72 hours for refills to be completed.    Today you received the following port flushed with Lab draw. Return as scheduled.   To help prevent nausea and vomiting after your treatment, we encourage you to take your nausea medication as directed.  BELOW ARE SYMPTOMS THAT SHOULD BE REPORTED IMMEDIATELY: *FEVER GREATER THAN 100.4 F (38 C) OR HIGHER *CHILLS OR SWEATING *NAUSEA AND VOMITING THAT IS NOT CONTROLLED WITH YOUR NAUSEA MEDICATION *UNUSUAL SHORTNESS OF BREATH *UNUSUAL BRUISING OR BLEEDING *URINARY PROBLEMS (pain or burning when urinating, or frequent urination) *BOWEL PROBLEMS (unusual diarrhea, constipation, pain near the anus) TENDERNESS IN MOUTH AND THROAT WITH OR WITHOUT PRESENCE OF ULCERS (sore throat, sores in mouth, or a toothache) UNUSUAL RASH, SWELLING OR PAIN  UNUSUAL VAGINAL DISCHARGE OR ITCHING   Items with * indicate a potential emergency and should be followed up as soon as possible or go to the  Emergency Department if any problems should occur.  Please show the CHEMOTHERAPY ALERT CARD or IMMUNOTHERAPY ALERT CARD at check-in to the Emergency Department and triage nurse.  Should you have questions after your visit or need to cancel or reschedule your appointment, please contact [facility] [phone]  and follow the prompts.  Office hours are 8:00 a.m. to 4:30 p.m. Monday - Friday. Please note that voicemails left after 4:00 p.m. may not be returned until the following business day.  We are closed weekends and major holidays. You have access to a nurse at all times for urgent questions. Please call the main number to the clinic [phone] and follow the prompts.  For any non-urgent questions, you may also contact your provider using MyChart. We now offer e-Visits for anyone 6 and older to request care online for non-urgent symptoms. For details visit [email or web address].   Also download the MyChart app! Go to the app store, search "MyChart", open the app, select [place], and log in with your MyChart username and password.

## 2022-06-02 NOTE — Telephone Encounter (Signed)
Patient states that Myrbetriq 25 mg was not working really well. Samples of Myrbetriq 50 mg given per Dr. Ronne Binning

## 2022-06-03 NOTE — Radiation Completion Notes (Addendum)
  Radiation Oncology         [phone] ________________________________  Name: [name] MRN: [id number]  Date: 05/31/2022  DOB: 10/17/1945  End of Treatment Note  Patient Name: [name], [name] MRN: [id number] Date of Birth: April 25, 1945 Referring Physician: Richardean Canal, M.D. Date of Service: 2022-06-03 Radiation Oncologist: Margaretmary Bayley, M.D.  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, concurrent with chemotherapy   Intent: Curative     ==========DELIVERED PLANS==========  First Treatment Date: 2022-04-12 - Last Treatment Date: 2022-05-31   Plan Name: Bladder_Bst Site: Bladder Technique: 3D Mode: Photon Dose Per Fraction: 1.8 Gy Prescribed Dose (Delivered / Prescribed): 19.8 Gy / 19.8 Gy Prescribed Fxs (Delivered / Prescribed): 11 / 11   Plan Name: Bladder_Pelv Site: Bladder Technique: 3D Mode: Photon Dose Per Fraction: 1.8 Gy Prescribed Dose (Delivered / Prescribed): 45 Gy / 45 Gy Prescribed Fxs (Delivered / Prescribed): 25 / 25     ==========ON TREATMENT VISIT DATES========== 2022-04-16, 2022-04-23, 2022-04-29, 2022-05-07, 2022-05-14, 2022-05-20, 2022-05-28    See weekly On Treatment Notes is Epic for details.  The patient tolerated radiation treatment relatively well.   He did report some moderate dysuria that was improved with over-the-counter AZO as needed.  The patient will receive a call in about one month from the radiation oncology department. He will continue follow up with his urologist, Dr. Ronne Binning and his medical oncologist, Dr. Ellin Saba as well.  ------------------------------------------------   Margaretmary Dys, MD [facility]  Radiation Oncology Direct Dial: [phone]  Fax: [phone] [email or web address]  Skype  LinkedIn

## 2022-06-04 ENCOUNTER — Ambulatory Visit: Payer: Self-pay | Admitting: *Deleted

## 2022-06-04 ENCOUNTER — Encounter: Payer: Self-pay | Admitting: *Deleted

## 2022-06-04 NOTE — Patient Outreach (Signed)
  Care Coordination   Follow Up Visit Note   06/04/2022 Name: [name] MRN: [id number] DOB: [birth year]  [name] is a 78 y.o. year old male who sees Assunta Found, MD for primary care. I spoke with  [name] by phone today.  What matters to the patients health and wellness today?  Managing bladder cancer and staying active    Goals Addressed             This Visit's Progress    Care Coordination Services r/t Bladder Cancer       Care Coordination Goals: Patient will keep all scheduled medical appointments Patient will reach out to provider with any new or worsening symptoms or side effects Patient will take medication as prescribed Myrbetriq increased to 50mg  and is working well for urinary frequency Patient will remain physically active and rest as needed Patient will eat a balanced diet and work with nutritionist Patient will reach out to RN Care Coordinator [phone] with any care coordination or resource needs        SDOH assessments and interventions completed:  Yes  SDOH Interventions Today    Flowsheet Row Most Recent Value  SDOH Interventions   Transportation Interventions Intervention Not Indicated  Financial Strain Interventions Intervention Not Indicated  Physical Activity Interventions Intervention Not Indicated        Care Coordination Interventions:  Yes, provided  Interventions Today    Flowsheet Row Most Recent Value  Chronic Disease   Chronic disease during today's visit Other  [bladder cancer. Patient is feeling well and has good energy level]  General Interventions   General Interventions Discussed/Reviewed General Interventions Discussed, General Interventions Reviewed, Doctor Visits, Labs  Doctor Visits Discussed/Reviewed Doctor Visits Discussed, Doctor Visits Reviewed, PCP, Specialist  [Reviewed recent oncology and urology office notes. Discussed progress with patient. Reviewed upcoming appointments and  procedures]  PCP/Specialist Visits Compliance with follow-up visit  Exercise Interventions   Exercise Discussed/Reviewed Physical Activity, Exercise Discussed, Exercise Reviewed  Physical Activity Discussed/Reviewed Physical Activity Discussed, Physical Activity Reviewed, Home Exercise Program (HEP)  Education Interventions   Education Provided Provided Education  Provided Verbal Education On Mental Health/Coping with Illness, When to see the doctor, Medication  Nutrition Interventions   Nutrition Discussed/Reviewed Nutrition Reviewed, Nutrition Discussed  Pharmacy Interventions   Pharmacy Dicussed/Reviewed Pharmacy Topics Discussed, Pharmacy Topics Reviewed, Medications and their functions  [myrbetriq increased to 50mg  to help with urinary frequency. It is helping.]       Follow up plan: Follow up call scheduled for 08/10/22    Encounter Outcome:  Pt. Visit Completed   Demetrios Loll, BSN, RN-BC RN Care Coordinator [facility]  [facility] Direct Dial: [phone] Main #: [phone]

## 2022-06-30 ENCOUNTER — Other Ambulatory Visit: Payer: Self-pay | Admitting: Cardiovascular Disease

## 2022-06-30 ENCOUNTER — Other Ambulatory Visit: Payer: Self-pay | Admitting: Nurse Practitioner

## 2022-07-12 NOTE — Progress Notes (Signed)
  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 Voicemail left.  Patient has a scheduled follow up visit with his urologist, Dr. Ronne Binning for ongoing surveillance. He was counseled that PSA levels will be drawn in the urology office, and was reassured that additional time is expected to improve bowel and bladder symptoms. He was encouraged to call back with concerns or questions regarding radiation.   [name], LPN

## 2022-07-13 ENCOUNTER — Telehealth: Payer: Self-pay | Admitting: *Deleted

## 2022-07-13 ENCOUNTER — Ambulatory Visit
Admission: RE | Admit: 2022-07-13 | Discharge: 2022-07-13 | Disposition: A | Discharge status: Home / Self Care | Payer: Medicare Other | Source: Ambulatory Visit | Attending: Family Medicine | Admitting: Family Medicine

## 2022-07-13 NOTE — Patient Outreach (Signed)
  Care Coordination   07/13/2022 Name: HAEDYN ODLE MRN: [id number] DOB: 25-Jun-1945   Care Coordination Outreach Attempts:  Returned daughter, [name]'s, call re: appt that was scheduled for 2:00 pm today.   Follow Up Plan:  Additional outreach attempts will be made to offer the patient care coordination information and services.   Encounter Outcome:  No Answer   Care Coordination Interventions:  No, not indicated    Follow-up call with [facility] is scheduled for 08/10/22. Left message for daughter to reach out to me sooner if needed. Advised on voicemail that appt for today at 2:00 was a post-treatment follow-up telephone call from [facility] and not for an in person appointment.   Demetrios Loll, BSN, RN-BC RN Care Coordinator [facility]  [facility] Direct Dial: [phone] Main #: [phone]

## 2022-07-27 ENCOUNTER — Other Ambulatory Visit: Payer: Self-pay

## 2022-07-27 DIAGNOSIS — C678 Malignant neoplasm of overlapping sites of bladder: Secondary | ICD-10-CM

## 2022-07-28 ENCOUNTER — Encounter (HOSPITAL_COMMUNITY): Payer: Self-pay | Admitting: Radiology

## 2022-07-28 ENCOUNTER — Inpatient Hospital Stay: Payer: Medicare Other | Attending: Hematology

## 2022-07-28 ENCOUNTER — Ambulatory Visit (HOSPITAL_COMMUNITY)
Admission: RE | Admit: 2022-07-28 | Discharge: 2022-07-28 | Disposition: A | Discharge status: Home / Self Care | Payer: Medicare Other | Source: Ambulatory Visit | Attending: Hematology | Admitting: Hematology

## 2022-07-28 DIAGNOSIS — D509 Iron deficiency anemia, unspecified: Secondary | ICD-10-CM | POA: Insufficient documentation

## 2022-07-28 DIAGNOSIS — Z8042 Family history of malignant neoplasm of prostate: Secondary | ICD-10-CM | POA: Insufficient documentation

## 2022-07-28 DIAGNOSIS — D631 Anemia in chronic kidney disease: Secondary | ICD-10-CM | POA: Diagnosis not present

## 2022-07-28 DIAGNOSIS — C678 Malignant neoplasm of overlapping sites of bladder: Secondary | ICD-10-CM | POA: Insufficient documentation

## 2022-07-28 DIAGNOSIS — N189 Chronic kidney disease, unspecified: Secondary | ICD-10-CM | POA: Insufficient documentation

## 2022-07-28 DIAGNOSIS — Z87891 Personal history of nicotine dependence: Secondary | ICD-10-CM | POA: Diagnosis not present

## 2022-07-28 DIAGNOSIS — N3289 Other specified disorders of bladder: Secondary | ICD-10-CM | POA: Diagnosis not present

## 2022-07-28 DIAGNOSIS — K8689 Other specified diseases of pancreas: Secondary | ICD-10-CM | POA: Diagnosis not present

## 2022-07-28 DIAGNOSIS — C672 Malignant neoplasm of lateral wall of bladder: Secondary | ICD-10-CM | POA: Insufficient documentation

## 2022-07-28 DIAGNOSIS — C679 Malignant neoplasm of bladder, unspecified: Secondary | ICD-10-CM | POA: Diagnosis not present

## 2022-07-28 LAB — CBC WITH DIFFERENTIAL/PLATELET
Abs Immature Granulocytes: 0.02 10*3/uL (ref 0.00–0.07)
Basophils Absolute: 0 10*3/uL (ref 0.0–0.1)
Basophils Relative: 1 %
Eosinophils Absolute: 0.1 10*3/uL (ref 0.0–0.5)
Eosinophils Relative: 3 %
HCT: 31.1 % — ABNORMAL LOW (ref 39.0–52.0)
Hemoglobin: 10 g/dL — ABNORMAL LOW (ref 13.0–17.0)
Immature Granulocytes: 1 %
Lymphocytes Relative: 30 %
Lymphs Abs: 1.2 10*3/uL (ref 0.7–4.0)
MCH: 35.6 pg — ABNORMAL HIGH (ref 26.0–34.0)
MCHC: 32.2 g/dL (ref 30.0–36.0)
MCV: 110.7 fL — ABNORMAL HIGH (ref 80.0–100.0)
Monocytes Absolute: 0.6 10*3/uL (ref 0.1–1.0)
Monocytes Relative: 15 %
Neutro Abs: 2.1 10*3/uL (ref 1.7–7.7)
Neutrophils Relative %: 50 %
Platelets: 163 10*3/uL (ref 150–400)
RBC: 2.81 MIL/uL — ABNORMAL LOW (ref 4.22–5.81)
RDW: 13.4 % (ref 11.5–15.5)
WBC: 4.1 10*3/uL (ref 4.0–10.5)
nRBC: 0 % (ref 0.0–0.2)

## 2022-07-28 LAB — COMPREHENSIVE METABOLIC PANEL
ALT: 20 U/L (ref 0–44)
AST: 22 U/L (ref 15–41)
Albumin: 3.8 g/dL (ref 3.5–5.0)
Alkaline Phosphatase: 35 U/L — ABNORMAL LOW (ref 38–126)
Anion gap: 8 (ref 5–15)
BUN: 31 mg/dL — ABNORMAL HIGH (ref 8–23)
CO2: 25 mmol/L (ref 22–32)
Calcium: 8.8 mg/dL — ABNORMAL LOW (ref 8.9–10.3)
Chloride: 102 mmol/L (ref 98–111)
Creatinine, Ser: 1.67 mg/dL — ABNORMAL HIGH (ref 0.61–1.24)
GFR, Estimated: 41 mL/min — ABNORMAL LOW (ref >60)
Glucose, Bld: 132 mg/dL — ABNORMAL HIGH (ref 70–99)
Potassium: 4 mmol/L (ref 3.5–5.1)
Sodium: 135 mmol/L (ref 135–145)
Total Bilirubin: 0.5 mg/dL (ref 0.3–1.2)
Total Protein: 6.6 g/dL (ref 6.5–8.1)

## 2022-07-28 LAB — MAGNESIUM: Magnesium: 2.2 mg/dL (ref 1.7–2.4)

## 2022-07-28 MED ORDER — HEPARIN SOD (PORK) LOCK FLUSH 100 UNIT/ML IV SOLN
INTRAVENOUS | Status: AC
  Filled 2022-07-28: qty 5

## 2022-07-28 MED ORDER — IOHEXOL 300 MG/ML  SOLN
80.0000 mL | Freq: Once | INTRAMUSCULAR | Status: AC | PRN
  Administered 2022-07-28: 80 mL via INTRAVENOUS

## 2022-08-04 ENCOUNTER — Ambulatory Visit: Payer: Medicare Other | Admitting: Urology

## 2022-08-04 VITALS — BP 136/75 | HR 58

## 2022-08-04 DIAGNOSIS — Z8551 Personal history of malignant neoplasm of bladder: Secondary | ICD-10-CM

## 2022-08-04 DIAGNOSIS — N400 Enlarged prostate without lower urinary tract symptoms: Secondary | ICD-10-CM

## 2022-08-04 DIAGNOSIS — C679 Malignant neoplasm of bladder, unspecified: Secondary | ICD-10-CM | POA: Diagnosis not present

## 2022-08-04 LAB — MICROSCOPIC EXAMINATION
Bacteria, UA: NONE SEEN
RBC, Urine: >30 /hpf — AB (ref 0–2)

## 2022-08-04 LAB — URINALYSIS, ROUTINE W REFLEX MICROSCOPIC
Bilirubin, UA: NEGATIVE
Glucose, UA: NEGATIVE
Ketones, UA: NEGATIVE
Leukocytes,UA: NEGATIVE
Nitrite, UA: NEGATIVE
Specific Gravity, UA: 1.03 (ref 1.005–1.030)
Urobilinogen, Ur: 1 mg/dL (ref 0.2–1.0)
pH, UA: 5 (ref 5.0–7.5)

## 2022-08-04 MED ORDER — GEMTESA 75 MG PO TABS: 1.0000 | ORAL_TABLET | Freq: Every day | ORAL | Status: DC

## 2022-08-04 MED ORDER — CIPROFLOXACIN HCL 500 MG PO TABS
500.0000 mg | ORAL_TABLET | Freq: Once | ORAL | Status: AC
Start: 2022-08-04 — End: 2022-08-04
  Administered 2022-08-04: 500 mg via ORAL

## 2022-08-04 NOTE — Progress Notes (Signed)
   08/04/22  CC: followup bladder cancer   HPI: Mr Ausborn is a 77yo here for followup for bladder cancer Blood pressure 136/75, pulse (!) 58. NED. A&Ox3.   No respiratory distress   Abd soft, NT, ND Normal phallus with bilateral descended testicles  Cystoscopy Procedure Note  Patient identification was confirmed, informed consent was obtained, and patient was prepped using Betadine solution.  Lidocaine jelly was administered per urethral meatus.     Pre-Procedure: - Inspection reveals a normal caliber ureteral meatus.  Procedure: The flexible cystoscope was introduced without difficulty - No urethral strictures/lesions are present. - Enlarged prostate  - Normal bladder neck - Bilateral ureteral orifices identified - Bladder mucosa  reveals no ulcers, tumors, or lesions - No bladder stones - No trabeculation     Post-Procedure: - Patient tolerated the procedure well  Assessment/ Plan: Followup 3 months for cystoscopy  No follow-ups on file.  Wilkie Aye, MD

## 2022-08-04 NOTE — Progress Notes (Signed)
[facility] [address] [place] [address]    Clinic Day:  08/05/2022  Referring physician: Assunta Found, MD  Patient Care Team: Assunta Found, MD as PCP - General (Family Medicine) [name], MD as PCP - Cardiology (Cardiology) Gwenith Daily, RN as [facility] Care Management Doreatha Massed, MD as Medical Oncologist (Medical Oncology) Therese Sarah, RN as Oncology Nurse Navigator (Medical Oncology)   ASSESSMENT & PLAN:   Assessment: 1.  T2 N0 bladder cancer (multifocal): - 12/16/2014: Initial diagnosis, malignant neoplasm of lateral wall of bladder - 03/14/2018: TURBT (pathology-invasive papillary urothelial carcinoma, high-grade, tumor involves lamina propria, muscularis propria present, not involved by tumor) - 05/04/2018 - 06/13/2018: Initial BCG induction course - 08/22/2018: Bladder biopsy HG Ta, multifocal - 08/29/2018 - 10/01/2018: Intravesicular valrubicin - 02/20/2019: Bladder biopsy-benign - 09/15/2019: TURBT- HG Ta, muscle present - 10/24/2019 - 11/28/2019: Completion of BCG induction course x 6 - 05/14/2020: TURBT-HG Ta, muscle present - 07/02/2020 - 08/20/2020: Intravesical gemcitabine/docetaxel - 03/10/2021: TURBT-HG Ta + CIS, muscle present - 04/29/2021 - 06/03/2021: Reinduction intravesical gemcitabine/docetaxel - 07/06/2021: Cystoscopy without any visible tumors, cytology with high-grade urothelial carcinoma - 12/30/2021: Progression with lesions throughout the bladder, muscle invasive at left lateral wall - He has chronic neuropathy, CKD which precludes him from cisplatin-based neoadjuvant chemotherapy followed by cystectomy. - He met with radiation oncology at [facility].  Due to multifocal nature of bladder cancer, they recommended him to go for surgery.  He preferred trimodality therapy. - Cystoscopy and TURBT (03/24/2022): At [facility] with specimens taken from right posterior wall bladder tumor, left anterior dome bladder tumor  and left lateral wall scar.  Pathology shows urothelial carcinoma in situ with no evidence of invasion in all 3 specimens.  Muscularis propria present, uninvolved by malignancy. - XRT started on 04/12/2022, cycle 1 of 5-FU and mitomycin on 04/14/2022.  XRT completed on 05/31/2022.   2.  Social/family history: - He lives at home by himself.  He is seen today with his daughter.  He is independent of ADLs and IADLs.  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  He dances 1-2 times per week.  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  Quit smoking 50 years ago. - Father had prostate cancer.  Maternal grandmother had cancer, type unknown to the patient.    Plan: 1.  T2 N0 multifocal bladder cancer: - He was seen by Dr. Ronne Binning and underwent office cystoscopy.  He was told that he had some radiation changes. - We reviewed CTAP with contrast from 07/28/2022: Asymmetric wall thickening of the bladder, progressed to but could be due to lack of distention.  Small perivesical lymph nodes but no pelvic sidewall or external iliac lymphadenopathy.  No evidence of metastatic disease. - Labs from 07/28/2022: Normal LFTs.  Creatinine 1.67 and stable.  CBC grossly normal with improving anemia. - Recommend follow-up in 3 months with repeat labs.  Would follow-up after cystoscopy by Dr. Ronne Binning in 3 months.  Will plan on repeating CT scan in 6 months.   2.  Normocytic to microcytic anemia: - He reports tiredness after walking half a mile.  He occasionally gets chest pains. - Hemoglobin is 10.  Last ferritin was 363 and percent saturation 38. - He has anemia from CKD and functional iron deficiency. - Recommend Feraheme 510 mg IV x 1.  Will plan to repeat ferritin and iron panel in 3 months.    Orders Placed This  Encounter  Procedures   CBC with Differential    Standing Status:   Future    Standing Expiration Date:   08/05/2023   Comprehensive metabolic panel    Standing Status:   Future     Standing Expiration Date:   08/05/2023   Iron and TIBC ([facility] ONLY)    Standing Status:   Future    Standing Expiration Date:   08/05/2023   Ferritin    Standing Status:   Future    Standing Expiration Date:   08/05/2023      Mikeal Hawthorne R Teague,acting as a scribe for Doreatha Massed, MD.,have documented all relevant documentation on the behalf of Doreatha Massed, MD,as directed by  Doreatha Massed, MD while in the presence of Doreatha Massed, MD.  I, Doreatha Massed MD, have reviewed the above documentation for accuracy and completeness, and I agree with the above.    Doreatha Massed, MD   7/25/202412:51 PM  CHIEF COMPLAINT:   Diagnosis: T2 N0 bladder cancer    Cancer Staging  Bladder cancer [facility]) Staging form: Urinary Bladder, AJCC 8th Edition - Clinical stage from 01/29/2022: Stage II (cT2, cN0, cM0) - Unsigned    Prior Therapy: none  Current Therapy:  Trimodality therapy    HISTORY OF PRESENT ILLNESS:   Oncology History  Bladder cancer (HCC)  01/29/2022 Initial Diagnosis   Bladder cancer (HCC)   04/14/2022 -  Chemotherapy   Patient is on Treatment Plan : BLADDER Mitomycin D1 + 5FU D1-5, 21-25 + XRT        INTERVAL HISTORY:   [name] is a 77 y.o. male presenting to clinic today for follow up of T2 N0 bladder cancer. He was last seen by me on 06/02/22.  He underwent a CT A/P on 7/17 that found: marked asymmetric wall thickening of the bladder which appears progressive but could in part be due to lack of distension; perivesical interstitial stranding changes suggesting radiation changes; small perivesical lymph nodes but no pelvic sidewall or external iliac adenopathy; no findings for metastatic disease involving the abdomen/pelvis or osseous structures; and stable 5 mm right middle lobe pulmonary nodule, unchanged since 2015 and considered benign.   Patient had a urinalysis on 7/24 that found 2+ protein the urine. Microscopic  examination revealed over 30 RBC in the urine, granular casts present, and mucus present.   Today, he states that he is doing well overall. His appetite level is at 100%. His energy level is at 50%.  PAST MEDICAL HISTORY:   Past Medical History: Past Medical History:  Diagnosis Date   Anginal pain (HCC)    Arthritis    "all over"   Bladder tumor    Chronic lower back pain    Coronary artery disease    a. s/p CABG x 2 (LIMA->LAD, RIMA->RCA);  b. s/p multiple PCI's to Ramus;  c. 08/2011 Cath/PCI: LM 70% into ramus with 70-80% there->treated wtih 3.5x18 Xience Xpedition DES, LCX  nonobs, RCA occluded.  RIMA & LIMA patent, EF 55-65%   DJD (degenerative joint disease)    GERD (gastroesophageal reflux disease)    History of gout    Hyperlipidemia    Hypertension    Hypothyroidism    Neuropathy    OSA on CPAP    Prostate cancer [facility])     Surgical History: Past Surgical History:  Procedure Laterality Date   ABDOMINAL HERNIA REPAIR     BIOPSY  11/26/2015   Procedure: BIOPSY;  Surgeon: Malissa Hippo, MD;  [facility];  Service: Endoscopy;;  gastric esophagus   BIOPSY  01/20/2021   Procedure: BIOPSY;  Surgeon: Dolores Frame, MD;  [facility];  Service: Gastroenterology;;   BIOPSY  10/28/2021   Procedure: BIOPSY;  Surgeon: Dolores Frame, MD;  [facility];  Service: Gastroenterology;;   BLADDER TUMOR EXCISION     BROW LIFT Bilateral 04/27/2019   Procedure: BILATERAL BLEPHAROPLASTY;  Surgeon: Fabio Pierce, MD;  Location: [facility];  Service: Ophthalmology;  Laterality: Bilateral;   CARDIAC CATHETERIZATION N/A 04/10/2015   Procedure: Left Heart Cath and Cors/Grafts Angiography;  Surgeon: [name], MD;  Location: [facility] INVASIVE CV LAB;  Service: Cardiovascular;  Laterality: N/A;   CARDIAC CATHETERIZATION N/A 04/10/2015   Procedure: Coronary Stent Intervention;  Surgeon: [name], MD;  Location: [facility] INVASIVE CV LAB;  Service:  Cardiovascular;  Laterality: N/A;   CATARACT EXTRACTION W/PHACO Right 06/23/2015   Procedure: CATARACT EXTRACTION PHACO AND INTRAOCULAR LENS PLACEMENT RIGHT EYE; CDE:  7.08;  Surgeon: Gemma Payor, MD;  [facility];  Service: Ophthalmology;  Laterality: Right;   CATARACT EXTRACTION W/PHACO Left 08/25/2015   Procedure: CATARACT EXTRACTION PHACO AND INTRAOCULAR LENS PLACEMENT LEFT EYE; CDE:  8.18;  Surgeon: Gemma Payor, MD;  Location: [facility];  Service: Ophthalmology;  Laterality: Left;   COLONOSCOPY N/A 12/28/2012   Procedure: COLONOSCOPY;  Surgeon: Malissa Hippo, MD;  [facility];  Service: Endoscopy;  Laterality: N/A;  830 rescheduled   COLONOSCOPY WITH PROPOFOL N/A 01/20/2021   Procedure: COLONOSCOPY WITH PROPOFOL;  Surgeon: Dolores Frame, MD;  [facility];  Service: Gastroenterology;  Laterality: N/A;  11:05   CORONARY ANGIOPLASTY WITH STENT PLACEMENT  2013; 04/10/2015   "this makes me a total of 7" (04/10/2015)   CORONARY ARTERY BYPASS GRAFT  1997   with (LIMA)   ESOPHAGEAL DILATION N/A 11/26/2015   Procedure: ESOPHAGEAL DILATION;  Surgeon: Malissa Hippo, MD;  [facility];  Service: Endoscopy;  Laterality: N/A;   ESOPHAGEAL DILATION N/A 05/23/2017   Procedure: ESOPHAGEAL DILATION;  Surgeon: Malissa Hippo, MD;  [facility];  Service: Endoscopy;  Laterality: N/A;   ESOPHAGEAL DILATION N/A 06/16/2017   Procedure: ESOPHAGEAL DILATION;  Surgeon: Malissa Hippo, MD;  [facility];  Service: Endoscopy;  Laterality: N/A;   ESOPHAGEAL DILATION  10/28/2021   Procedure: ESOPHAGEAL DILATION;  Surgeon: Dolores Frame, MD;  [facility];  Service: Gastroenterology;;   ESOPHAGOGASTRODUODENOSCOPY N/A 11/26/2015   Procedure: ESOPHAGOGASTRODUODENOSCOPY (EGD);  Surgeon: Malissa Hippo, MD;  [facility];  Service: Endoscopy;  Laterality: N/A;  1:25   ESOPHAGOGASTRODUODENOSCOPY N/A 05/23/2017    Procedure: ESOPHAGOGASTRODUODENOSCOPY (EGD);  Surgeon: Malissa Hippo, MD;  [facility];  Service: Endoscopy;  Laterality: N/A;  1:15   ESOPHAGOGASTRODUODENOSCOPY N/A 06/16/2017   Procedure: ESOPHAGOGASTRODUODENOSCOPY (EGD);  Surgeon: Malissa Hippo, MD;  [facility];  Service: Endoscopy;  Laterality: N/A;  1240   ESOPHAGOGASTRODUODENOSCOPY (EGD) WITH PROPOFOL N/A 01/20/2021   Procedure: ESOPHAGOGASTRODUODENOSCOPY (EGD) WITH PROPOFOL;  Surgeon: Dolores Frame, MD;  [facility];  Service: Gastroenterology;  Laterality: N/A;   ESOPHAGOGASTRODUODENOSCOPY (EGD) WITH PROPOFOL N/A 10/28/2021   Procedure: ESOPHAGOGASTRODUODENOSCOPY (EGD) WITH PROPOFOL;  Surgeon: Dolores Frame, MD;  [facility];  Service: Gastroenterology;  Laterality: N/A;  215 ASA 2, pt will arrive at 10:45   HERNIA REPAIR Left    IR IMAGING GUIDED PORT INSERTION  02/24/2022   KNEE CARTILAGE SURGERY Bilateral    LEFT HEART CATH AND CORS/GRAFTS ANGIOGRAPHY N/A 09/08/2016   Procedure: LEFT HEART CATH AND CORS/GRAFTS ANGIOGRAPHY;  Surgeon: Swaziland, Peter M, MD;  Location: [facility] INVASIVE CV LAB;  Service: Cardiovascular;  Laterality: N/A;   LEFT HEART CATHETERIZATION WITH CORONARY ANGIOGRAM N/A 09/08/2011   Procedure: LEFT HEART CATHETERIZATION WITH CORONARY ANGIOGRAM;  Surgeon: [name], MD;  Location: [facility] CATH LAB;  Service: Cardiovascular;  Laterality: N/A;   PERCUTANEOUS CORONARY STENT INTERVENTION (PCI-S) Right 09/08/2011   Procedure: PERCUTANEOUS CORONARY STENT INTERVENTION (PCI-S);  Surgeon: [name], MD;  Location: [facility] CATH LAB;  Service: Cardiovascular;  Laterality: Right;   POLYPECTOMY  01/20/2021   Procedure: POLYPECTOMY;  Surgeon: Dolores Frame, MD;  [facility];  Service: Gastroenterology;;   ROBOT ASSISTED LAPAROSCOPIC RADICAL PROSTATECTOMY      Social History: Social History   Socioeconomic History   Marital status: Widowed     Spouse name: Not on file   Number of children: Not on file   Years of education: Not on file   Highest education level: Not on file  Occupational History   Occupation: semi-retired  Tobacco Use   Smoking status: Former    Types: Cigars    Passive exposure: Past   Smokeless tobacco: Never   Tobacco comments:    "quit smoking in ~ 1978"  Vaping Use   Vaping status: Never Used  Substance and Sexual Activity   Alcohol use: Yes    Comment: occasionally    Drug use: No   Sexual activity: Not Currently  Other Topics Concern   Not on file  Social History Narrative   Not on file   Social Determinants of Health   Financial Resource Strain: Low Risk  (06/04/2022)   Overall Financial Resource Strain (CARDIA)    Difficulty of Paying Living Expenses: Not hard at all  Food Insecurity: No Food Insecurity (02/04/2022)   Hunger Vital Sign    Worried About Running Out of Food in the Last Year: Never true    Ran Out of Food in the Last Year: Never true  Transportation Needs: No Transportation Needs (06/04/2022)   PRAPARE - Administrator, Civil Service (Medical): No    Lack of Transportation (Non-Medical): No  Physical Activity: Sufficiently Active (06/04/2022)   Exercise Vital Sign    Days of Exercise per Week: 5 days    Minutes of Exercise per Session: 30 min  Stress: Not on file  Social Connections: Not on file  Intimate Partner Violence: Not At Risk (02/04/2022)   Humiliation, Afraid, Rape, and Kick questionnaire    Fear of Current or Ex-Partner: No    Emotionally Abused: No    Physically Abused: No    Sexually Abused: No    Family History: Family History  Problem Relation Age of Onset   Cancer Father 49       died   Stroke Mother 56       died    Current Medications:  Current Outpatient Medications:    amLODipine (NORVASC) 10 MG tablet, Take 10 mg by mouth in the morning., Disp: , Rfl:    aspirin EC 81 MG tablet, Take 81 mg by mouth every evening., Disp: 30  tablet, Rfl:    atorvastatin (LIPITOR) 40 MG tablet, TAKE 1/2 TABLET(20 MG) BY MOUTH EVERY EVENING, Disp: 45 tablet, Rfl: 3   carvedilol (COREG) 6.25 MG tablet, Take 1 tablet (6.25 mg total) by mouth 2 (  two) times daily., Disp: 180 tablet, Rfl: 3   cetirizine (ZYRTEC) 10 MG tablet, Take 10 mg by mouth every evening., Disp: , Rfl:    cholecalciferol (VITAMIN D3) 25 MCG (1000 UNIT) tablet, Take 1,000 Units by mouth every evening., Disp: , Rfl:    clopidogrel (PLAVIX) 75 MG tablet, Take 1 tablet (75 mg total) by mouth in the morning., Disp: 90 tablet, Rfl: 3   Coenzyme Q10 300 MG CAPS, Take 300 mg by mouth every evening., Disp: , Rfl:    cyanocobalamin (,VITAMIN B-12,) 1000 MCG/ML injection, Inject 1,000 mcg into the muscle See admin instructions. Inject (1000 mcg) intramuscularly once monthly as needed for B-12 deficiency., Disp: , Rfl: 2   fenofibrate 160 MG tablet, TAKE 1 TABLET(160 MG) BY MOUTH DAILY, Disp: 30 tablet, Rfl: 0   FEROSUL 325 (65 Fe) MG tablet, TAKE 1 TABLET(325 MG) BY MOUTH TWICE DAILY WITH A MEAL, Disp: 180 tablet, Rfl: 3   gabapentin (NEURONTIN) 600 MG tablet, Take 600 mg by mouth 3 (three) times daily., Disp: , Rfl: 3   GLUCOSAMINE-CHONDROITIN PO, Take 1 tablet by mouth every evening., Disp: , Rfl:    isosorbide mononitrate (IMDUR) 60 MG 24 hr tablet, Take 1.5 tablets (90 mg total) by mouth daily. (Patient taking differently: Take 90 mg by mouth every evening.), Disp: 135 tablet, Rfl: 3   levothyroxine (SYNTHROID, LEVOTHROID) 150 MCG tablet, Take 150 mcg by mouth daily before breakfast., Disp: , Rfl:    meclizine (ANTIVERT) 25 MG tablet, Take 1 tablet (25 mg total) by mouth 2 (two) times daily as needed for dizziness. (Patient taking differently: Take 12.5 mg by mouth at bedtime as needed for dizziness.), Disp: 30 tablet, Rfl: 0   mirabegron ER (MYRBETRIQ) 25 MG TB24 tablet, Take 1 tablet (25 mg total) by mouth daily., Disp: 30 tablet, Rfl: 0   Polyethyl Glycol-Propyl Glycol  (LUBRICANT EYE DROPS) 0.4-0.3 % SOLN, Place 1-2 drops into both eyes 3 (three) times daily as needed (dry/irritated eyes.)., Disp: , Rfl:    traMADol (ULTRAM) 50 MG tablet, Take 50 mg by mouth 3 (three) times daily., Disp: , Rfl: 1   Vibegron (GEMTESA) 75 MG TABS, Take 1 tablet (75 mg total) by mouth daily., Disp: , Rfl:    nitroGLYCERIN (NITROSTAT) 0.4 MG SL tablet, Place 1 tablet (0.4 mg total) under the tongue every 5 (five) minutes as needed for chest pain. (Patient not taking: Reported on 08/05/2022), Disp: 25 tablet, Rfl: 11   prochlorperazine (COMPAZINE) 10 MG tablet, Take 1 tablet (10 mg total) by mouth every 6 (six) hours as needed for nausea or vomiting. (Patient not taking: Reported on 08/05/2022), Disp: 30 tablet, Rfl: 1   Allergies: Allergies  Allergen Reactions   Codeine Nausea Only   Pollen Extract Other (See Comments)    Seasonal    REVIEW OF SYSTEMS:   Review of Systems  Constitutional:  Negative for chills, fatigue and fever.  HENT:   Positive for trouble swallowing. Negative for lump/mass, mouth sores, nosebleeds and sore throat.   Eyes:  Negative for eye problems.  Respiratory:  Positive for shortness of breath. Negative for cough.   Cardiovascular:  Positive for chest pain and palpitations. Negative for leg swelling.  Gastrointestinal:  Negative for abdominal pain, constipation, diarrhea, nausea and vomiting.  Genitourinary:  Negative for bladder incontinence, difficulty urinating, dysuria, frequency, hematuria and nocturia.   Musculoskeletal:  Negative for arthralgias, back pain, flank pain, myalgias and neck pain.  Skin:  Negative for itching and  rash.  Neurological:  Positive for dizziness and numbness. Negative for headaches.  Hematological:  Does not bruise/bleed easily.  Psychiatric/Behavioral:  Negative for depression, sleep disturbance and suicidal ideas. The patient is not nervous/anxious.   All other systems reviewed and are negative.    VITALS:   Blood  pressure (!) 142/62, pulse (!) 55, temperature 98 F (36.7 C), temperature source Oral, resp. rate 16, weight 215 lb 12.8 oz (97.9 kg), SpO2 98%.  Wt Readings from Last 3 Encounters:  08/05/22 215 lb 12.8 oz (97.9 kg)  06/02/22 213 lb 3.2 oz (96.7 kg)  05/12/22 221 lb 12.8 oz (100.6 kg)    Body mass index is 28.47 kg/m.  Performance status (ECOG): 1 - Symptomatic but completely ambulatory  PHYSICAL EXAM:   Physical Exam Vitals and nursing note reviewed. Exam conducted with a chaperone present.  Constitutional:      Appearance: Normal appearance.  Cardiovascular:     Rate and Rhythm: Normal rate and regular rhythm.     Pulses: Normal pulses.     Heart sounds: Normal heart sounds.  Pulmonary:     Effort: Pulmonary effort is normal.     Breath sounds: Normal breath sounds.  Abdominal:     Palpations: Abdomen is soft. There is no hepatomegaly, splenomegaly or mass.     Tenderness: There is no abdominal tenderness.  Musculoskeletal:     Right lower leg: No edema.     Left lower leg: No edema.  Lymphadenopathy:     Cervical: No cervical adenopathy.     Right cervical: No superficial, deep or posterior cervical adenopathy.    Left cervical: No superficial, deep or posterior cervical adenopathy.     Upper Body:     Right upper body: No supraclavicular or axillary adenopathy.     Left upper body: No supraclavicular or axillary adenopathy.  Neurological:     General: No focal deficit present.     Mental Status: He is alert and oriented to person, place, and time.  Psychiatric:        Mood and Affect: Mood normal.        Behavior: Behavior normal.     LABS:      Latest Ref Rng & Units 07/28/2022    2:20 PM 06/02/2022   11:07 AM 05/12/2022    9:21 AM  CBC  WBC 4.0 - 10.5 K/uL 4.1  3.4  3.0   Hemoglobin 13.0 - 17.0 g/dL 29.5  8.9  9.6   Hematocrit 39.0 - 52.0 % 31.1  27.0  28.6   Platelets 150 - 400 K/uL 163  199  97       Latest Ref Rng & Units 07/28/2022    2:20 PM  06/02/2022   11:07 AM 05/12/2022    9:21 AM  CMP  Glucose 70 - 99 mg/dL 284  132  440   BUN 8 - 23 mg/dL 31  23  22    Creatinine 0.61 - 1.24 mg/dL 1.02  7.25  3.66   Sodium 135 - 145 mmol/L 135  137  138   Potassium 3.5 - 5.1 mmol/L 4.0  3.9  3.7   Chloride 98 - 111 mmol/L 102  102  105   CO2 22 - 32 mmol/L 25  26  27    Calcium 8.9 - 10.3 mg/dL 8.8  8.5  8.8   Total Protein 6.5 - 8.1 g/dL 6.6  6.8  6.5   Total Bilirubin 0.3 - 1.2 mg/dL 0.5  0.3  0.7   Alkaline Phos 38 - 126 U/L 35  46  39   AST 15 - 41 U/L 22  24  21    ALT 0 - 44 U/L 20  22  21       No results found for: "CEA1", "CEA" / No results found for: "CEA1", "CEA" No results found for: "PSA1" No results found for: "KGM010" No results found for: "CAN125"  No results found for: "TOTALPROTELP", "ALBUMINELP", "A1GS", "A2GS", "BETS", "BETA2SER", "GAMS", "MSPIKE", "SPEI" Lab Results  Component Value Date   TIBC 326 06/02/2022   TIBC 398 02/04/2022   TIBC 360 12/14/2021   FERRITIN 363 (H) 06/02/2022   FERRITIN 93 02/04/2022   FERRITIN 79 12/14/2021   IRONPCTSAT 38 06/02/2022   IRONPCTSAT 32 02/04/2022   IRONPCTSAT 36 12/14/2021   No results found for: "LDH"   STUDIES:   CT Abdomen Pelvis W Contrast  Result Date: 08/05/2022 CLINICAL DATA:  Bladder cancer.  Restaging.  * Tracking Code: BO * EXAM: CT ABDOMEN AND PELVIS WITH CONTRAST TECHNIQUE: Multidetector CT imaging of the abdomen and pelvis was performed using the standard protocol following bolus administration of intravenous contrast. RADIATION DOSE REDUCTION: This exam was performed according to the departmental dose-optimization program which includes automated exposure control, adjustment of the mA and/or kV according to patient size and/or use of iterative reconstruction technique. CONTRAST:  80mL OMNIPAQUE IOHEXOL 300 MG/ML  SOLN COMPARISON:  CT scan 02/08/2022 FINDINGS: Lower chest: Stable 5 mm right middle lobe pulmonary nodule, unchanged since 2015 and considered  benign. Stable atherosclerotic calcifications involving the aorta and coronary arteries. No pericardial effusion. Hepatobiliary: No hepatic lesions or intrahepatic biliary dilatation. The gallbladder is unremarkable. No common bile duct dilatation. Pancreas: No mass, inflammation or ductal dilatation. Stable advanced pancreatic atrophy. Spleen: Normal size.  No focal lesions. Adrenals/Urinary Tract: The adrenal glands are normal. Stable benign nonenhancing right renal cysts not requiring any further imaging evaluation or. No worrisome renal lesions hydronephrosis. Both ureters normal. Marked asymmetric wall thickening of the bladder which appears progressive but could in part be due to lack of distension. Perivesical interstitial stranding changes suggesting radiation changes. Small perivesical lymph nodes are noted. No pelvic sidewall external iliac adenopathy. No inguinal adenopathy. Stomach/Bowel: Stable advanced descending and sigmoid colon diverticulosis. No colonic mass or obstruction. The terminal ileum and appendix are. The stomach, duodenum and small bowel are unremarkable. Vascular/Lymphatic: Stable advanced atherosclerotic calcifications involving the aorta and branch vessels but no aneurysm or dissection. The major venous structures are patent. No mesenteric retroperitoneal lymphadenopathy. Reproductive: Status post prostatectomy. Other: No pelvic mass or adenopathy. No free pelvic fluid collections. No inguinal mass or adenopathy. No abdominal wall hernia or subcutaneous lesions. Musculoskeletal: No findings for osseous metastatic disease. IMPRESSION: 1. Marked asymmetric wall thickening of the bladder which appears progressive but could in part be due to lack of distension. Perivesical interstitial stranding changes suggesting radiation changes. 2. Small perivesical lymph nodes but no pelvic sidewall or external iliac adenopathy. 3. Status post prostatectomy. 4. No findings for metastatic disease  involving the abdomen/pelvis or osseous structures. 5. Stable 5 mm right middle lobe pulmonary nodule, unchanged since 2015 and considered benign. Aortic Atherosclerosis (ICD10-I70.0). Electronically Signed   By: Rudie Meyer M.D.   On: 08/05/2022 09:06

## 2022-08-05 ENCOUNTER — Other Ambulatory Visit: Payer: Self-pay | Admitting: Nurse Practitioner

## 2022-08-05 ENCOUNTER — Inpatient Hospital Stay: Payer: Medicare Other | Admitting: Hematology

## 2022-08-05 VITALS — BP 142/62 | HR 55 | Temp 98.0°F | Resp 16 | Wt 215.8 lb

## 2022-08-05 DIAGNOSIS — N189 Chronic kidney disease, unspecified: Secondary | ICD-10-CM | POA: Diagnosis not present

## 2022-08-05 DIAGNOSIS — C672 Malignant neoplasm of lateral wall of bladder: Secondary | ICD-10-CM | POA: Diagnosis not present

## 2022-08-05 DIAGNOSIS — D631 Anemia in chronic kidney disease: Secondary | ICD-10-CM | POA: Diagnosis not present

## 2022-08-05 DIAGNOSIS — Z87891 Personal history of nicotine dependence: Secondary | ICD-10-CM | POA: Diagnosis not present

## 2022-08-05 DIAGNOSIS — D509 Iron deficiency anemia, unspecified: Secondary | ICD-10-CM | POA: Diagnosis not present

## 2022-08-05 DIAGNOSIS — C678 Malignant neoplasm of overlapping sites of bladder: Secondary | ICD-10-CM

## 2022-08-05 NOTE — Patient Instructions (Addendum)
[facility] at [facility] Discharge Instructions   You were seen and examined today by Dr. Ellin Saba.  He reviewed the results of your CT scan. There is no evidence of cancer on this exam.   He reviewed the results of your lab work which are normal/stable. Your hemoglobin has improved to 10. Dr. Kirtland Bouchard recommends you get one iron infusion to give you more energy and see if this helps with your intermittent chest pain.   We will see you back in 3 months. We will repeat lab work prior to your next visit.   Return as scheduled.      Thank you for choosing [facility] at [facility] to provide your oncology and hematology care.  To afford each patient quality time with our provider, please arrive at least 15 minutes before your scheduled appointment time.   If you have a lab appointment with the Cancer Center please come in thru the Main Entrance and check in at the main information desk.  You need to re-schedule your appointment should you arrive 10 or more minutes late.  We strive to give you quality time with our providers, and arriving late affects you and other patients whose appointments are after yours.  Also, if you no show three or more times for appointments you may be dismissed from the clinic at the providers discretion.     Again, thank you for choosing [facility].  Our hope is that these requests will decrease the amount of time that you wait before being seen by our physicians.       _____________________________________________________________  Should you have questions after your visit to [facility], please contact our office at [phone] and follow the prompts.  Our office hours are 8:00 a.m. and 4:30 p.m. Monday - Friday.  Please note that voicemails left after 4:00 p.m. may not be returned until the following business day.  We are closed weekends and major holidays.  You do have access to a nurse 24-7,  just call the main number to the clinic [phone] and do not press any options, hold on the line and a nurse will answer the phone.    For prescription refill requests, have your pharmacy contact our office and allow 72 hours.    Due to Covid, you will need to wear a mask upon entering the hospital. If you do not have a mask, a mask will be given to you at the Main Entrance upon arrival. For doctor visits, patients may have 1 support person age 34 or older with them. For treatment visits, patients can not have anyone with them due to social distancing guidelines and our immunocompromised population.

## 2022-08-10 ENCOUNTER — Encounter: Payer: Self-pay | Admitting: Urology

## 2022-08-10 ENCOUNTER — Ambulatory Visit: Payer: Self-pay | Admitting: *Deleted

## 2022-08-10 ENCOUNTER — Encounter: Payer: Self-pay | Admitting: *Deleted

## 2022-08-10 NOTE — Patient Instructions (Signed)

## 2022-08-10 NOTE — Patient Outreach (Signed)
  Care Coordination   Follow Up Visit Note   08/10/2022 Name: [name] MRN: [id number] DOB: [birth year]  [name] is a 77 y.o. year old male who sees Assunta Found, MD for primary care. I spoke with  [name] by phone today.  What matters to the patients health and wellness today? Continue to improve after bladder cancer   Goals Addressed             This Visit's Progress    COMPLETED: Care Coordination Services r/t Bladder Cancer   On track    Care Coordination Goals: Patient will keep all scheduled medical appointments Patient will reach out to provider with any new or worsening symptoms or side effects Patient will take medication as prescribed Myrbetriq increased to 50mg  and is working well for urinary frequency Patient will remain physically active and rest as needed Patient will eat a balanced diet and work with nutritionist Patient will reach out to RN Care Coordinator [phone] with any care coordination or resource needs         SDOH assessments and interventions completed:  Yes  SDOH Interventions Today    Flowsheet Row Most Recent Value  SDOH Interventions   Transportation Interventions Intervention Not Indicated  Financial Strain Interventions Intervention Not Indicated        Care Coordination Interventions:  Yes, provided  Interventions Today    Flowsheet Row Most Recent Value  Chronic Disease   Chronic disease during today's visit Other  [Bladder Cancer]  General Interventions   General Interventions Discussed/Reviewed General Interventions Discussed, General Interventions Reviewed, Doctor Visits, Labs  [Patient reports feeling well. No questions or concerns today.]  Labs --  [facility]]  Doctor Visits Discussed/Reviewed Doctor Visits Discussed, Doctor Visits Reviewed, Specialist  PCP/Specialist Visits Compliance with follow-up visit  [F/u with Dr Ronne Binning (urologist) in 3 months, Iron infusion scheduled at [facility] on 08/13/22]  Exercise Interventions   Exercise Discussed/Reviewed Physical Activity, Exercise Discussed, Exercise Reviewed  Physical Activity Discussed/Reviewed Physical Activity Discussed, Physical Activity Reviewed, Gym  [continue moderate physical acitivity for at least 150 minutes per week]  Education Interventions   Education Provided Provided Education  Provided Verbal Education On Medication, When to see the doctor, Nutrition, Labs  [reviewed and discussed recent CT scan results]  Labs Reviewed --  [facility], Iron]  Nutrition Interventions   Nutrition Discussed/Reviewed Nutrition Discussed, Nutrition Reviewed  Pharmacy Interventions   Pharmacy Dicussed/Reviewed Pharmacy Topics Discussed, Pharmacy Topics Reviewed, Medications and their functions  [Myrbetriq has helped to decrease urinary urgency, frequency, and nocturia. He is urinating about 4-5 times per night instead of 10-11 and he can hold his bladder for about 2 hours during the day.]       Follow up plan: No further intervention required. Patient can reach out to RN Care Coordinator at [phone] if resource or care coordination services are needed in the future.   Encounter Outcome:  Pt. Visit Completed   Demetrios Loll, BSN, RN-BC RN Care Coordinator [facility]  [facility] Direct Dial: [phone] Main #: [phone]

## 2022-08-12 MED FILL — Ferumoxytol Inj 510 MG/17ML (30 MG/ML) (Elemental Fe): INTRAVENOUS | Qty: 17 | Status: AC

## 2022-08-13 ENCOUNTER — Inpatient Hospital Stay: Payer: Medicare Other | Attending: Hematology

## 2022-08-13 VITALS — BP 121/50 | HR 59 | Temp 97.6°F | Resp 18

## 2022-08-13 DIAGNOSIS — D509 Iron deficiency anemia, unspecified: Secondary | ICD-10-CM

## 2022-08-13 DIAGNOSIS — D500 Iron deficiency anemia secondary to blood loss (chronic): Secondary | ICD-10-CM

## 2022-08-13 MED ORDER — ALTEPLASE 2 MG IJ SOLR: 2.0000 mg | Freq: Once | INTRAMUSCULAR | Status: DC | PRN

## 2022-08-13 MED ORDER — SODIUM CHLORIDE 0.9% FLUSH
10.0000 mL | Freq: Once | INTRAVENOUS | Status: AC | PRN
  Administered 2022-08-13: 10 mL

## 2022-08-13 MED ORDER — HEPARIN SOD (PORK) LOCK FLUSH 100 UNIT/ML IV SOLN
500.0000 [IU] | Freq: Once | INTRAVENOUS | Status: AC | PRN
  Administered 2022-08-13: 500 [IU]

## 2022-08-13 MED ORDER — SODIUM CHLORIDE 0.9 % IV SOLN
510.0000 mg | Freq: Once | INTRAVENOUS | Status: AC
  Administered 2022-08-13: 510 mg via INTRAVENOUS
  Filled 2022-08-13: qty 510

## 2022-08-13 MED ORDER — SODIUM CHLORIDE 0.9 % IV SOLN: Freq: Once | INTRAVENOUS | Status: AC

## 2022-08-13 NOTE — Patient Instructions (Signed)
[facility]  Discharge Instructions: Thank you for choosing [facility] to provide your oncology and hematology care.  If you have a lab appointment with the Cancer Center - please note that after April 8th, 2024, all labs will be drawn in the cancer center.  You do not have to check in or register with the main entrance as you have in the past but will complete your check-in in the cancer center.  Wear comfortable clothing and clothing appropriate for easy access to any Portacath or PICC line.   We strive to give you quality time with your provider. You may need to reschedule your appointment if you arrive late (15 or more minutes).  Arriving late affects you and other patients whose appointments are after yours.  Also, if you miss three or more appointments without notifying the office, you may be dismissed from the clinic at the provider's discretion.      For prescription refill requests, have your pharmacy contact our office and allow 72 hours for refills to be completed.    Today you received the following:  Feraheme.  Ferumoxytol Injection What is this medication? FERUMOXYTOL (FER ue MOX i tol) treats low levels of iron in your body (iron deficiency anemia). Iron is a mineral that plays an important role in making red blood cells, which carry oxygen from your lungs to the rest of your body. This medicine may be used for other purposes; ask your health care provider or pharmacist if you have questions. COMMON BRAND NAME(S): Feraheme What should I tell my care team before I take this medication? They need to know if you have any of these conditions: Anemia not caused by low iron levels High levels of iron in the blood Magnetic resonance imaging (MRI) test scheduled An unusual or allergic reaction to iron, other medications, foods, dyes, or preservatives Pregnant or trying to get pregnant Breastfeeding How should I use this medication? This medication  is injected into a vein. It is given by your care team in a hospital or clinic setting. Talk to your care team the use of this medication in children. Special care may be needed. Overdosage: If you think you have taken too much of this medicine contact a poison control center or emergency room at once. NOTE: This medicine is only for you. Do not share this medicine with others. What if I miss a dose? It is important not to miss your dose. Call your care team if you are unable to keep an appointment. What may interact with this medication? Other iron products This list may not describe all possible interactions. Give your health care provider a list of all the medicines, herbs, non-prescription drugs, or dietary supplements you use. Also tell them if you smoke, drink alcohol, or use illegal drugs. Some items may interact with your medicine. What should I watch for while using this medication? Visit your care team regularly. Tell your care team if your symptoms do not start to get better or if they get worse. You may need blood work done while you are taking this medication. You may need to follow a special diet. Talk to your care team. Foods that contain iron include: whole grains/cereals, dried fruits, beans, or peas, leafy green vegetables, and organ meats (liver, kidney). What side effects may I notice from receiving this medication? Side effects that you should report to your care team as soon as possible: Allergic reactions--skin rash, itching, hives, swelling of the  face, lips, tongue, or throat Low blood pressure--dizziness, feeling faint or lightheaded, blurry vision Shortness of breath Side effects that usually do not require medical attention (report to your care team if they continue or are bothersome): Flushing Headache Joint pain Muscle pain Nausea Pain, redness, or irritation at injection site This list may not describe all possible side effects. Call your doctor for medical  advice about side effects. You may report side effects to FDA at 1-800-FDA-1088. Where should I keep my medication? This medication is given in a hospital or clinic. It will not be stored at home. NOTE: This sheet is a summary. It may not cover all possible information. If you have questions about this medicine, talk to your doctor, pharmacist, or health care provider.  2024 Elsevier/Gold Standard (2022-06-04 00:00:00)     To help prevent nausea and vomiting after your treatment, we encourage you to take your nausea medication as directed.  BELOW ARE SYMPTOMS THAT SHOULD BE REPORTED IMMEDIATELY: *FEVER GREATER THAN 100.4 F (38 C) OR HIGHER *CHILLS OR SWEATING *NAUSEA AND VOMITING THAT IS NOT CONTROLLED WITH YOUR NAUSEA MEDICATION *UNUSUAL SHORTNESS OF BREATH *UNUSUAL BRUISING OR BLEEDING *URINARY PROBLEMS (pain or burning when urinating, or frequent urination) *BOWEL PROBLEMS (unusual diarrhea, constipation, pain near the anus) TENDERNESS IN MOUTH AND THROAT WITH OR WITHOUT PRESENCE OF ULCERS (sore throat, sores in mouth, or a toothache) UNUSUAL RASH, SWELLING OR PAIN  UNUSUAL VAGINAL DISCHARGE OR ITCHING   Items with * indicate a potential emergency and should be followed up as soon as possible or go to the Emergency Department if any problems should occur.  Please show the CHEMOTHERAPY ALERT CARD or IMMUNOTHERAPY ALERT CARD at check-in to the Emergency Department and triage nurse.  Should you have questions after your visit or need to cancel or reschedule your appointment, please contact [facility] [phone]  and follow the prompts.  Office hours are 8:00 a.m. to 4:30 p.m. Monday - Friday. Please note that voicemails left after 4:00 p.m. may not be returned until the following business day.  We are closed weekends and major holidays. You have access to a nurse at all times for urgent questions. Please call the main number to the clinic [phone] and follow  the prompts.  For any non-urgent questions, you may also contact your provider using MyChart. We now offer e-Visits for anyone 100 and older to request care online for non-urgent symptoms. For details visit [email or web address].   Also download the MyChart app! Go to the app store, search "MyChart", open the app, select [place], and log in with your MyChart username and password.

## 2022-08-13 NOTE — Progress Notes (Signed)
Patient presents today for iron infusion.  Patient is in satisfactory condition with no new complaints voiced.  Vital signs are stable.  We will proceed with infusion per provider orders.   Patient tolerated treatment well with no complaints voiced.  Patient left ambulatory in stable condition.  Vital signs stable at discharge.  Follow up as scheduled.    

## 2022-09-12 ENCOUNTER — Other Ambulatory Visit: Payer: Self-pay | Admitting: Nurse Practitioner

## 2022-09-14 ENCOUNTER — Other Ambulatory Visit: Payer: Self-pay | Admitting: Nurse Practitioner

## 2022-09-29 DIAGNOSIS — Z6820 Body mass index (BMI) 20.0-20.9, adult: Secondary | ICD-10-CM | POA: Diagnosis not present

## 2022-09-29 DIAGNOSIS — E782 Mixed hyperlipidemia: Secondary | ICD-10-CM | POA: Diagnosis not present

## 2022-09-29 DIAGNOSIS — R7309 Other abnormal glucose: Secondary | ICD-10-CM | POA: Diagnosis not present

## 2022-09-29 DIAGNOSIS — Z23 Encounter for immunization: Secondary | ICD-10-CM | POA: Diagnosis not present

## 2022-09-29 DIAGNOSIS — Z0001 Encounter for general adult medical examination with abnormal findings: Secondary | ICD-10-CM | POA: Diagnosis not present

## 2022-09-29 DIAGNOSIS — I25810 Atherosclerosis of coronary artery bypass graft(s) without angina pectoris: Secondary | ICD-10-CM | POA: Diagnosis not present

## 2022-09-29 DIAGNOSIS — G4733 Obstructive sleep apnea (adult) (pediatric): Secondary | ICD-10-CM | POA: Diagnosis not present

## 2022-09-29 DIAGNOSIS — E7849 Other hyperlipidemia: Secondary | ICD-10-CM | POA: Diagnosis not present

## 2022-09-29 DIAGNOSIS — E039 Hypothyroidism, unspecified: Secondary | ICD-10-CM | POA: Diagnosis not present

## 2022-10-12 ENCOUNTER — Other Ambulatory Visit: Payer: Self-pay | Admitting: Nurse Practitioner

## 2022-10-21 DIAGNOSIS — E119 Type 2 diabetes mellitus without complications: Secondary | ICD-10-CM | POA: Diagnosis not present

## 2022-10-29 ENCOUNTER — Telehealth: Payer: Self-pay

## 2022-10-29 NOTE — Telephone Encounter (Signed)
Opened in error

## 2022-11-09 ENCOUNTER — Other Ambulatory Visit: Payer: Self-pay

## 2022-11-10 ENCOUNTER — Inpatient Hospital Stay: Payer: Medicare Other | Attending: Hematology

## 2022-11-10 ENCOUNTER — Other Ambulatory Visit: Payer: Medicare Other | Admitting: Urology

## 2022-11-10 DIAGNOSIS — D631 Anemia in chronic kidney disease: Secondary | ICD-10-CM | POA: Insufficient documentation

## 2022-11-10 DIAGNOSIS — N189 Chronic kidney disease, unspecified: Secondary | ICD-10-CM | POA: Diagnosis not present

## 2022-11-10 DIAGNOSIS — Z87891 Personal history of nicotine dependence: Secondary | ICD-10-CM | POA: Insufficient documentation

## 2022-11-10 DIAGNOSIS — C672 Malignant neoplasm of lateral wall of bladder: Secondary | ICD-10-CM | POA: Diagnosis not present

## 2022-11-10 DIAGNOSIS — G629 Polyneuropathy, unspecified: Secondary | ICD-10-CM | POA: Insufficient documentation

## 2022-11-10 DIAGNOSIS — Z809 Family history of malignant neoplasm, unspecified: Secondary | ICD-10-CM | POA: Diagnosis not present

## 2022-11-10 DIAGNOSIS — D509 Iron deficiency anemia, unspecified: Secondary | ICD-10-CM

## 2022-11-10 DIAGNOSIS — C678 Malignant neoplasm of overlapping sites of bladder: Secondary | ICD-10-CM

## 2022-11-10 LAB — CBC WITH DIFFERENTIAL/PLATELET
Abs Immature Granulocytes: 0.02 10*3/uL (ref 0.00–0.07)
Basophils Absolute: 0 10*3/uL (ref 0.0–0.1)
Basophils Relative: 1 %
Eosinophils Absolute: 0.1 10*3/uL (ref 0.0–0.5)
Eosinophils Relative: 2 %
HCT: 33.8 % — ABNORMAL LOW (ref 39.0–52.0)
Hemoglobin: 10.6 g/dL — ABNORMAL LOW (ref 13.0–17.0)
Immature Granulocytes: 0 %
Lymphocytes Relative: 21 %
Lymphs Abs: 1 10*3/uL (ref 0.7–4.0)
MCH: 33.9 pg (ref 26.0–34.0)
MCHC: 31.4 g/dL (ref 30.0–36.0)
MCV: 108 fL — ABNORMAL HIGH (ref 80.0–100.0)
Monocytes Absolute: 0.6 10*3/uL (ref 0.1–1.0)
Monocytes Relative: 13 %
Neutro Abs: 3 10*3/uL (ref 1.7–7.7)
Neutrophils Relative %: 63 %
Platelets: 213 10*3/uL (ref 150–400)
RBC: 3.13 MIL/uL — ABNORMAL LOW (ref 4.22–5.81)
RDW: 13.2 % (ref 11.5–15.5)
WBC: 4.8 10*3/uL (ref 4.0–10.5)
nRBC: 0 % (ref 0.0–0.2)

## 2022-11-10 LAB — COMPREHENSIVE METABOLIC PANEL
ALT: 20 U/L (ref 0–44)
AST: 21 U/L (ref 15–41)
Albumin: 3.9 g/dL (ref 3.5–5.0)
Alkaline Phosphatase: 45 U/L (ref 38–126)
Anion gap: 8 (ref 5–15)
BUN: 21 mg/dL (ref 8–23)
CO2: 28 mmol/L (ref 22–32)
Calcium: 9.3 mg/dL (ref 8.9–10.3)
Chloride: 102 mmol/L (ref 98–111)
Creatinine, Ser: 1.4 mg/dL — ABNORMAL HIGH (ref 0.61–1.24)
GFR, Estimated: 51 mL/min — ABNORMAL LOW (ref >60)
Glucose, Bld: 104 mg/dL — ABNORMAL HIGH (ref 70–99)
Potassium: 4.2 mmol/L (ref 3.5–5.1)
Sodium: 138 mmol/L (ref 135–145)
Total Bilirubin: 0.5 mg/dL (ref 0.3–1.2)
Total Protein: 7.1 g/dL (ref 6.5–8.1)

## 2022-11-10 LAB — IRON AND TIBC
Iron: 93 ug/dL (ref 45–182)
Saturation Ratios: 25 % (ref 17.9–39.5)
TIBC: 373 ug/dL (ref 250–450)
UIBC: 280 ug/dL

## 2022-11-10 LAB — FERRITIN: Ferritin: 210 ng/mL (ref 24–336)

## 2022-11-11 ENCOUNTER — Inpatient Hospital Stay: Payer: Medicare Other | Admitting: Hematology

## 2022-11-11 VITALS — BP 138/62 | HR 59 | Temp 98.6°F | Resp 17 | Wt 209.0 lb

## 2022-11-11 DIAGNOSIS — C678 Malignant neoplasm of overlapping sites of bladder: Secondary | ICD-10-CM | POA: Diagnosis not present

## 2022-11-11 DIAGNOSIS — N189 Chronic kidney disease, unspecified: Secondary | ICD-10-CM | POA: Diagnosis not present

## 2022-11-11 DIAGNOSIS — C672 Malignant neoplasm of lateral wall of bladder: Secondary | ICD-10-CM | POA: Diagnosis not present

## 2022-11-11 DIAGNOSIS — Z87891 Personal history of nicotine dependence: Secondary | ICD-10-CM | POA: Diagnosis not present

## 2022-11-11 DIAGNOSIS — G629 Polyneuropathy, unspecified: Secondary | ICD-10-CM | POA: Diagnosis not present

## 2022-11-11 DIAGNOSIS — D631 Anemia in chronic kidney disease: Secondary | ICD-10-CM | POA: Diagnosis not present

## 2022-11-11 DIAGNOSIS — Z809 Family history of malignant neoplasm, unspecified: Secondary | ICD-10-CM | POA: Diagnosis not present

## 2022-11-11 NOTE — Patient Instructions (Signed)
[facility] at [facility] Discharge Instructions   You were seen and examined today by Dr. Ellin Saba.  He reviewed the results of your lab work which are normal/stable.   We will see you back in 3 months. We will repeat lab work and a CT scan prior to this visit.  Return as scheduled.    Thank you for choosing [facility] at [facility] to provide your oncology and hematology care.  To afford each patient quality time with our provider, please arrive at least 15 minutes before your scheduled appointment time.   If you have a lab appointment with the Cancer Center please come in thru the Main Entrance and check in at the main information desk.  You need to re-schedule your appointment should you arrive 10 or more minutes late.  We strive to give you quality time with our providers, and arriving late affects you and other patients whose appointments are after yours.  Also, if you no show three or more times for appointments you may be dismissed from the clinic at the providers discretion.     Again, thank you for choosing [facility].  Our hope is that these requests will decrease the amount of time that you wait before being seen by our physicians.       _____________________________________________________________  Should you have questions after your visit to [facility], please contact our office at [phone] and follow the prompts.  Our office hours are 8:00 a.m. and 4:30 p.m. Monday - Friday.  Please note that voicemails left after 4:00 p.m. may not be returned until the following business day.  We are closed weekends and major holidays.  You do have access to a nurse 24-7, just call the main number to the clinic [phone] and do not press any options, hold on the line and a nurse will answer the phone.    For prescription refill requests, have your pharmacy contact our office and allow 72 hours.    Due to  Covid, you will need to wear a mask upon entering the hospital. If you do not have a mask, a mask will be given to you at the Main Entrance upon arrival. For doctor visits, patients may have 1 support person age 51 or older with them. For treatment visits, patients can not have anyone with them due to social distancing guidelines and our immunocompromised population.

## 2022-11-11 NOTE — Progress Notes (Signed)
[facility] [address] [place] [address]    Clinic Day:  11/11/22   Referring physician: Assunta Found, MD  Patient Care Team: Assunta Found, MD as PCP - General (Family Medicine) [name], MD as PCP - Cardiology (Cardiology) Doreatha Massed, MD as Medical Oncologist (Medical Oncology) Therese Sarah, RN as Oncology Nurse Navigator (Medical Oncology)   ASSESSMENT & PLAN:   Assessment: 1.  T2 N0 bladder cancer (multifocal): - 12/16/2014: Initial diagnosis, malignant neoplasm of lateral wall of bladder - 03/14/2018: TURBT (pathology-invasive papillary urothelial carcinoma, high-grade, tumor involves lamina propria, muscularis propria present, not involved by tumor) - 05/04/2018 - 06/13/2018: Initial BCG induction course - 08/22/2018: Bladder biopsy HG Ta, multifocal - 08/29/2018 - 10/01/2018: Intravesicular valrubicin - 02/20/2019: Bladder biopsy-benign - 09/15/2019: TURBT- HG Ta, muscle present - 10/24/2019 - 11/28/2019: Completion of BCG induction course x 6 - 05/14/2020: TURBT-HG Ta, muscle present - 07/02/2020 - 08/20/2020: Intravesical gemcitabine/docetaxel - 03/10/2021: TURBT-HG Ta + CIS, muscle present - 04/29/2021 - 06/03/2021: Reinduction intravesical gemcitabine/docetaxel - 07/06/2021: Cystoscopy without any visible tumors, cytology with high-grade urothelial carcinoma - 12/30/2021: Progression with lesions throughout the bladder, muscle invasive at left lateral wall - He has chronic neuropathy, CKD which precludes him from cisplatin-based neoadjuvant chemotherapy followed by cystectomy. - He met with radiation oncology at [facility].  Due to multifocal nature of bladder cancer, they recommended him to go for surgery.  He preferred trimodality therapy. - Cystoscopy and TURBT (03/24/2022): At [facility] with specimens taken from right posterior wall bladder tumor, left anterior dome bladder tumor and left lateral wall scar.  Pathology shows urothelial carcinoma  in situ with no evidence of invasion in all 3 specimens.  Muscularis propria present, uninvolved by malignancy. - XRT started on 04/12/2022, cycle 1 of 5-FU and mitomycin on 04/14/2022.  XRT completed on 05/31/2022.   2.  Social/family history: - He lives at home by himself.  He is seen today with his daughter.  He is independent of ADLs and IADLs.  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  He dances 1-2 times per week.  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  Quit smoking 50 years ago. - Father had prostate cancer.  Maternal grandmother had cancer, type unknown to the patient.    Plan: 1.  T2 N0 multifocal bladder cancer: - CTAP on 07/28/2022: Asymmetrical wall thickening of the bladder progressed due to lack of distention.  Small perivesical lymph nodes but no pelvic sidewall or external iliac lymphadenopathy.  No evidence of metastatic disease. - He had a cystoscopy by Dr. Ronne Binning on 08/04/2022, no evidence of tumor. - He reports occasional speck of blood in the urine. - Reviewed labs from 11/10/2022: Normal LFTs.  Creatinine stable at 1.4. - Recommend follow-up with Dr. Ronne Binning for cystoscopy.  I will see him back in 3 months with CTAP with contrast and routine labs.   2.  Normocytic to macrocytic anemia: - Anemia from CKD and functional iron deficiency.  Received Feraheme on 08/13/2022. - Ferritin is 210, percent saturation 25.  Hemoglobin is 10.6.    Orders Placed This Encounter  Procedures   CT ABDOMEN PELVIS W CONTRAST    Standing Status:   Future    Standing Expiration Date:   11/11/2023    Order Specific Question:   If indicated for the ordered procedure, I authorize the administration of contrast media per Radiology protocol    Answer:   Yes  Order Specific Question:   Does the patient have a contrast media/X-ray dye allergy?    Answer:   No    Order Specific Question:   Preferred imaging location?    Answer:   [facility]    Order Specific  Question:   If indicated for the ordered procedure, I authorize the administration of oral contrast media per Radiology protocol    Answer:   Yes      I,Helena R Teague,acting as a scribe for Doreatha Massed, MD.,have documented all relevant documentation on the behalf of Doreatha Massed, MD,as directed by  Doreatha Massed, MD while in the presence of Doreatha Massed, MD.  I, Doreatha Massed MD, have reviewed the above documentation for accuracy and completeness, and I agree with the above.     Doreatha Massed, MD   10/31/20245:29 PM  CHIEF COMPLAINT:   Diagnosis: T2 N0 bladder cancer    Cancer Staging  Bladder cancer [facility]) Staging form: Urinary Bladder, AJCC 8th Edition - Clinical stage from 01/29/2022: Stage II (cT2, cN0, cM0) - Unsigned    Prior Therapy: Trimodality therapy  Current Therapy: Surveillance   HISTORY OF PRESENT ILLNESS:   Oncology History  Bladder cancer (HCC)  01/29/2022 Initial Diagnosis   Bladder cancer (HCC)   04/14/2022 -  Chemotherapy   Patient is on Treatment Plan : BLADDER Mitomycin D1 + 5FU D1-5, 21-25 + XRT        INTERVAL HISTORY:   [name] is a 77 y.o. male presenting to clinic today for follow up of T2 N0 bladder cancer. He was last seen by me on 08/05/22.  Today, he states that he is doing well overall. His appetite level is at 100%. His energy level is at 70%.  He reports occasional blood spotting in the urine, around 2x a week. He notes pressure after urinating, but denies dysuria. He does not believe he has a UTI. He denies any new pains. His cytoscopy was reschedule to December 2024.   PAST MEDICAL HISTORY:   Past Medical History: Past Medical History:  Diagnosis Date   Anginal pain (HCC)    Arthritis    "all over"   Bladder tumor    Chronic lower back pain    Coronary artery disease    a. s/p CABG x 2 (LIMA->LAD, RIMA->RCA);  b. s/p multiple PCI's to Ramus;  c. 08/2011 Cath/PCI: LM 70% into ramus with  70-80% there->treated wtih 3.5x18 Xience Xpedition DES, LCX  nonobs, RCA occluded.  RIMA & LIMA patent, EF 55-65%   DJD (degenerative joint disease)    GERD (gastroesophageal reflux disease)    History of gout    Hyperlipidemia    Hypertension    Hypothyroidism    Neuropathy    OSA on CPAP    Prostate cancer [facility])     Surgical History: Past Surgical History:  Procedure Laterality Date   ABDOMINAL HERNIA REPAIR     BIOPSY  11/26/2015   Procedure: BIOPSY;  Surgeon: Malissa Hippo, MD;  [facility];  Service: Endoscopy;;  gastric esophagus   BIOPSY  01/20/2021   Procedure: BIOPSY;  Surgeon: Dolores Frame, MD;  [facility];  Service: Gastroenterology;;   BIOPSY  10/28/2021   Procedure: BIOPSY;  Surgeon: Dolores Frame, MD;  [facility];  Service: Gastroenterology;;   BLADDER TUMOR EXCISION     BROW LIFT Bilateral 04/27/2019   Procedure: BILATERAL BLEPHAROPLASTY;  Surgeon: Fabio Pierce, MD;  Location: [facility];  Service: Ophthalmology;  Laterality: Bilateral;   CARDIAC CATHETERIZATION N/A 04/10/2015   Procedure: Left Heart Cath and Cors/Grafts Angiography;  Surgeon: [name], MD;  Location: [facility] INVASIVE CV LAB;  Service: Cardiovascular;  Laterality: N/A;   CARDIAC CATHETERIZATION N/A 04/10/2015   Procedure: Coronary Stent Intervention;  Surgeon: [name], MD;  Location: [facility] INVASIVE CV LAB;  Service: Cardiovascular;  Laterality: N/A;   CATARACT EXTRACTION W/PHACO Right 06/23/2015   Procedure: CATARACT EXTRACTION PHACO AND INTRAOCULAR LENS PLACEMENT RIGHT EYE; CDE:  7.08;  Surgeon: Gemma Payor, MD;  Location: [facility];  Service: Ophthalmology;  Laterality: Right;   CATARACT EXTRACTION W/PHACO Left 08/25/2015   Procedure: CATARACT EXTRACTION PHACO AND INTRAOCULAR LENS PLACEMENT LEFT EYE; CDE:  8.18;  Surgeon: Gemma Payor, MD;  Location: [facility];  Service: Ophthalmology;  Laterality: Left;   COLONOSCOPY N/A 12/28/2012   Procedure:  COLONOSCOPY;  Surgeon: Malissa Hippo, MD;  [facility];  Service: Endoscopy;  Laterality: N/A;  830 rescheduled   COLONOSCOPY WITH PROPOFOL N/A 01/20/2021   Procedure: COLONOSCOPY WITH PROPOFOL;  Surgeon: Dolores Frame, MD;  [facility];  Service: Gastroenterology;  Laterality: N/A;  11:05   CORONARY ANGIOPLASTY WITH STENT PLACEMENT  2013; 04/10/2015   "this makes me a total of 7" (04/10/2015)   CORONARY ARTERY BYPASS GRAFT  1997   with (LIMA)   ESOPHAGEAL DILATION N/A 11/26/2015   Procedure: ESOPHAGEAL DILATION;  Surgeon: Malissa Hippo, MD;  [facility];  Service: Endoscopy;  Laterality: N/A;   ESOPHAGEAL DILATION N/A 05/23/2017   Procedure: ESOPHAGEAL DILATION;  Surgeon: Malissa Hippo, MD;  [facility];  Service: Endoscopy;  Laterality: N/A;   ESOPHAGEAL DILATION N/A 06/16/2017   Procedure: ESOPHAGEAL DILATION;  Surgeon: Malissa Hippo, MD;  [facility];  Service: Endoscopy;  Laterality: N/A;   ESOPHAGEAL DILATION  10/28/2021   Procedure: ESOPHAGEAL DILATION;  Surgeon: Dolores Frame, MD;  [facility];  Service: Gastroenterology;;   ESOPHAGOGASTRODUODENOSCOPY N/A 11/26/2015   Procedure: ESOPHAGOGASTRODUODENOSCOPY (EGD);  Surgeon: Malissa Hippo, MD;  [facility];  Service: Endoscopy;  Laterality: N/A;  1:25   ESOPHAGOGASTRODUODENOSCOPY N/A 05/23/2017   Procedure: ESOPHAGOGASTRODUODENOSCOPY (EGD);  Surgeon: Malissa Hippo, MD;  [facility];  Service: Endoscopy;  Laterality: N/A;  1:15   ESOPHAGOGASTRODUODENOSCOPY N/A 06/16/2017   Procedure: ESOPHAGOGASTRODUODENOSCOPY (EGD);  Surgeon: Malissa Hippo, MD;  [facility];  Service: Endoscopy;  Laterality: N/A;  1240   ESOPHAGOGASTRODUODENOSCOPY (EGD) WITH PROPOFOL N/A 01/20/2021   Procedure: ESOPHAGOGASTRODUODENOSCOPY (EGD) WITH PROPOFOL;  Surgeon: Dolores Frame, MD;  [facility];  Service:  Gastroenterology;  Laterality: N/A;   ESOPHAGOGASTRODUODENOSCOPY (EGD) WITH PROPOFOL N/A 10/28/2021   Procedure: ESOPHAGOGASTRODUODENOSCOPY (EGD) WITH PROPOFOL;  Surgeon: Dolores Frame, MD;  [facility];  Service: Gastroenterology;  Laterality: N/A;  215 ASA 2, pt will arrive at 10:45   HERNIA REPAIR Left    IR IMAGING GUIDED PORT INSERTION  02/24/2022   KNEE CARTILAGE SURGERY Bilateral    LEFT HEART CATH AND CORS/GRAFTS ANGIOGRAPHY N/A 09/08/2016   Procedure: LEFT HEART CATH AND CORS/GRAFTS ANGIOGRAPHY;  Surgeon: Swaziland, Peter M, MD;  Location: [facility] INVASIVE CV LAB;  Service: Cardiovascular;  Laterality: N/A;   LEFT HEART CATHETERIZATION WITH CORONARY ANGIOGRAM N/A 09/08/2011   Procedure: LEFT HEART CATHETERIZATION WITH CORONARY ANGIOGRAM;  Surgeon: [name], MD;  Location: [facility] CATH LAB;  Service: Cardiovascular;  Laterality: N/A;  PERCUTANEOUS CORONARY STENT INTERVENTION (PCI-S) Right 09/08/2011   Procedure: PERCUTANEOUS CORONARY STENT INTERVENTION (PCI-S);  Surgeon: [name], MD;  Location: [facility] CATH LAB;  Service: Cardiovascular;  Laterality: Right;   POLYPECTOMY  01/20/2021   Procedure: POLYPECTOMY;  Surgeon: Dolores Frame, MD;  [facility];  Service: Gastroenterology;;   ROBOT ASSISTED LAPAROSCOPIC RADICAL PROSTATECTOMY      Social History: Social History   Socioeconomic History   Marital status: Widowed    Spouse name: Not on file   Number of children: Not on file   Years of education: Not on file   Highest education level: Not on file  Occupational History   Occupation: semi-retired  Tobacco Use   Smoking status: Former    Types: Cigars    Passive exposure: Past   Smokeless tobacco: Never   Tobacco comments:    "quit smoking in ~ 1978"  Vaping Use   Vaping status: Never Used  Substance and Sexual Activity   Alcohol use: Yes    Comment: occasionally    Drug use: No   Sexual activity: Not Currently  Other Topics  Concern   Not on file  Social History Narrative   Not on file   Social Determinants of Health   Financial Resource Strain: Low Risk  (08/10/2022)   Overall Financial Resource Strain (CARDIA)    Difficulty of Paying Living Expenses: Not hard at all  Food Insecurity: No Food Insecurity (02/04/2022)   Hunger Vital Sign    Worried About Running Out of Food in the Last Year: Never true    Ran Out of Food in the Last Year: Never true  Transportation Needs: No Transportation Needs (08/10/2022)   PRAPARE - Administrator, Civil Service (Medical): No    Lack of Transportation (Non-Medical): No  Physical Activity: Sufficiently Active (06/04/2022)   Exercise Vital Sign    Days of Exercise per Week: 5 days    Minutes of Exercise per Session: 30 min  Stress: Not on file  Social Connections: Not on file  Intimate Partner Violence: Not At Risk (02/04/2022)   Humiliation, Afraid, Rape, and Kick questionnaire    Fear of Current or Ex-Partner: No    Emotionally Abused: No    Physically Abused: No    Sexually Abused: No    Family History: Family History  Problem Relation Age of Onset   Cancer Father 90       died   Stroke Mother 19       died    Current Medications:  Current Outpatient Medications:    amLODipine (NORVASC) 10 MG tablet, Take 10 mg by mouth in the morning., Disp: , Rfl:    aspirin EC 81 MG tablet, Take 81 mg by mouth every evening., Disp: 30 tablet, Rfl:    atorvastatin (LIPITOR) 40 MG tablet, TAKE 1/2 TABLET(20 MG) BY MOUTH EVERY EVENING, Disp: 45 tablet, Rfl: 3   carvedilol (COREG) 6.25 MG tablet, Take 1 tablet (6.25 mg total) by mouth 2 (two) times daily., Disp: 180 tablet, Rfl: 3   cetirizine (ZYRTEC) 10 MG tablet, Take 10 mg by mouth every evening., Disp: , Rfl:    cholecalciferol (VITAMIN D3) 25 MCG (1000 UNIT) tablet, Take 1,000 Units by mouth every evening., Disp: , Rfl:    clopidogrel (PLAVIX) 75 MG tablet, Take 1 tablet (75 mg total) by mouth in the  morning., Disp: 90 tablet, Rfl: 3   Coenzyme Q10 300 MG CAPS, Take 300 mg by mouth  every evening., Disp: , Rfl:    cyanocobalamin (,VITAMIN B-12,) 1000 MCG/ML injection, Inject 1,000 mcg into the muscle See admin instructions. Inject (1000 mcg) intramuscularly once monthly as needed for B-12 deficiency., Disp: , Rfl: 2   fenofibrate 160 MG tablet, TAKE 1 TABLET(160 MG) BY MOUTH DAILY, Disp: 90 tablet, Rfl: 3   FEROSUL 325 (65 Fe) MG tablet, TAKE 1 TABLET(325 MG) BY MOUTH TWICE DAILY WITH A MEAL, Disp: 180 tablet, Rfl: 3   gabapentin (NEURONTIN) 600 MG tablet, Take 600 mg by mouth 3 (three) times daily., Disp: , Rfl: 3   GLUCOSAMINE-CHONDROITIN PO, Take 1 tablet by mouth every evening., Disp: , Rfl:    isosorbide mononitrate (IMDUR) 60 MG 24 hr tablet, TAKE 1 AND 1/2 TABLETS(90 MG) BY MOUTH DAILY, Disp: 45 tablet, Rfl: 7   levothyroxine (SYNTHROID) 175 MCG tablet, Take 175 mcg by mouth daily., Disp: , Rfl:    meclizine (ANTIVERT) 25 MG tablet, Take 1 tablet (25 mg total) by mouth 2 (two) times daily as needed for dizziness. (Patient taking differently: Take 12.5 mg by mouth at bedtime as needed for dizziness.), Disp: 30 tablet, Rfl: 0   nitroGLYCERIN (NITROSTAT) 0.4 MG SL tablet, Place 1 tablet (0.4 mg total) under the tongue every 5 (five) minutes as needed for chest pain., Disp: 25 tablet, Rfl: 11   Polyethyl Glycol-Propyl Glycol (LUBRICANT EYE DROPS) 0.4-0.3 % SOLN, Place 1-2 drops into both eyes 3 (three) times daily as needed (dry/irritated eyes.)., Disp: , Rfl:    prochlorperazine (COMPAZINE) 10 MG tablet, Take 1 tablet (10 mg total) by mouth every 6 (six) hours as needed for nausea or vomiting., Disp: 30 tablet, Rfl: 1   traMADol (ULTRAM) 50 MG tablet, Take 50 mg by mouth 3 (three) times daily., Disp: , Rfl: 1   Vibegron (GEMTESA) 75 MG TABS, Take 1 tablet (75 mg total) by mouth daily., Disp: , Rfl:    Allergies: Allergies  Allergen Reactions   Codeine Nausea Only   Pollen Extract Other  (See Comments)    Seasonal    REVIEW OF SYSTEMS:   Review of Systems  Constitutional:  Negative for chills, fatigue and fever.  HENT:   Positive for trouble swallowing. Negative for lump/mass, mouth sores, nosebleeds and sore throat.   Eyes:  Negative for eye problems.  Respiratory:  Positive for shortness of breath. Negative for cough.   Cardiovascular:  Negative for chest pain, leg swelling and palpitations.  Gastrointestinal:  Negative for abdominal pain, constipation, diarrhea, nausea and vomiting.  Genitourinary:  Positive for hematuria. Negative for bladder incontinence, difficulty urinating, dysuria, frequency and nocturia.   Musculoskeletal:  Positive for arthralgias (throughout the body, 4/10 severity). Negative for back pain, flank pain, myalgias and neck pain.  Skin:  Negative for itching and rash.  Neurological:  Positive for dizziness. Negative for headaches and numbness.       +tingling feet  Hematological:  Does not bruise/bleed easily.  Psychiatric/Behavioral:  Negative for depression, sleep disturbance and suicidal ideas. The patient is not nervous/anxious.   All other systems reviewed and are negative.    VITALS:   Blood pressure 138/62, pulse (!) 59, temperature 98.6 F (37 C), temperature source Oral, resp. rate 17, weight 209 lb (94.8 kg), SpO2 98%.  Wt Readings from Last 3 Encounters:  11/11/22 209 lb (94.8 kg)  08/05/22 215 lb 12.8 oz (97.9 kg)  06/02/22 213 lb 3.2 oz (96.7 kg)    Body mass index is 27.57 kg/m.  Performance status (  ECOG): 1 - Symptomatic but completely ambulatory  PHYSICAL EXAM:   Physical Exam Vitals and nursing note reviewed. Exam conducted with a chaperone present.  Constitutional:      Appearance: Normal appearance.  Cardiovascular:     Rate and Rhythm: Normal rate and regular rhythm.     Pulses: Normal pulses.     Heart sounds: Normal heart sounds.  Pulmonary:     Effort: Pulmonary effort is normal.     Breath sounds:  Normal breath sounds.  Abdominal:     Palpations: Abdomen is soft. There is no hepatomegaly, splenomegaly or mass.     Tenderness: There is no abdominal tenderness.  Musculoskeletal:     Right lower leg: No edema.     Left lower leg: No edema.  Lymphadenopathy:     Cervical: No cervical adenopathy.     Right cervical: No superficial, deep or posterior cervical adenopathy.    Left cervical: No superficial, deep or posterior cervical adenopathy.     Upper Body:     Right upper body: No supraclavicular or axillary adenopathy.     Left upper body: No supraclavicular or axillary adenopathy.  Neurological:     General: No focal deficit present.     Mental Status: He is alert and oriented to person, place, and time.  Psychiatric:        Mood and Affect: Mood normal.        Behavior: Behavior normal.     LABS:      Latest Ref Rng & Units 11/10/2022   10:58 AM 07/28/2022    2:20 PM 06/02/2022   11:07 AM  CBC  WBC 4.0 - 10.5 K/uL 4.8  4.1  3.4   Hemoglobin 13.0 - 17.0 g/dL 16.1  09.6  8.9   Hematocrit 39.0 - 52.0 % 33.8  31.1  27.0   Platelets 150 - 400 K/uL 213  163  199       Latest Ref Rng & Units 11/10/2022   10:58 AM 07/28/2022    2:20 PM 06/02/2022   11:07 AM  CMP  Glucose 70 - 99 mg/dL 045  409  811   BUN 8 - 23 mg/dL 21  31  23    Creatinine 0.61 - 1.24 mg/dL 9.14  7.82  9.56   Sodium 135 - 145 mmol/L 138  135  137   Potassium 3.5 - 5.1 mmol/L 4.2  4.0  3.9   Chloride 98 - 111 mmol/L 102  102  102   CO2 22 - 32 mmol/L 28  25  26    Calcium 8.9 - 10.3 mg/dL 9.3  8.8  8.5   Total Protein 6.5 - 8.1 g/dL 7.1  6.6  6.8   Total Bilirubin 0.3 - 1.2 mg/dL 0.5  0.5  0.3   Alkaline Phos 38 - 126 U/L 45  35  46   AST 15 - 41 U/L 21  22  24    ALT 0 - 44 U/L 20  20  22       No results found for: "CEA1", "CEA" / No results found for: "CEA1", "CEA" No results found for: "PSA1" No results found for: "OZH086" No results found for: "CAN125"  No results found for: "TOTALPROTELP",  "ALBUMINELP", "A1GS", "A2GS", "BETS", "BETA2SER", "GAMS", "MSPIKE", "SPEI" Lab Results  Component Value Date   TIBC 373 11/10/2022   TIBC 326 06/02/2022   TIBC 398 02/04/2022   FERRITIN 210 11/10/2022   FERRITIN 363 (H) 06/02/2022   FERRITIN 93  02/04/2022   IRONPCTSAT 25 11/10/2022   IRONPCTSAT 38 06/02/2022   IRONPCTSAT 32 02/04/2022   No results found for: "LDH"   STUDIES:   No results found.

## 2022-11-12 ENCOUNTER — Other Ambulatory Visit: Payer: Self-pay

## 2022-11-18 DIAGNOSIS — L821 Other seborrheic keratosis: Secondary | ICD-10-CM | POA: Diagnosis not present

## 2022-11-18 DIAGNOSIS — L820 Inflamed seborrheic keratosis: Secondary | ICD-10-CM | POA: Diagnosis not present

## 2022-11-18 DIAGNOSIS — Z1283 Encounter for screening for malignant neoplasm of skin: Secondary | ICD-10-CM | POA: Diagnosis not present

## 2022-11-18 DIAGNOSIS — D225 Melanocytic nevi of trunk: Secondary | ICD-10-CM | POA: Diagnosis not present

## 2022-11-24 ENCOUNTER — Telehealth: Payer: Self-pay | Admitting: Urology

## 2022-11-24 MED ORDER — GEMTESA 75 MG PO TABS: 1.0000 | ORAL_TABLET | Freq: Every day | ORAL | 11 refills | Status: DC

## 2022-11-24 NOTE — Telephone Encounter (Signed)
Patient needs prescription called in to [facility] on [address]    Vibegron (GEMTESA) 75 MG TABS

## 2022-11-24 NOTE — Telephone Encounter (Signed)
sent 

## 2022-12-01 DIAGNOSIS — M67431 Ganglion, right wrist: Secondary | ICD-10-CM | POA: Diagnosis not present

## 2022-12-13 ENCOUNTER — Telehealth: Payer: Self-pay | Admitting: *Deleted

## 2022-12-13 ENCOUNTER — Ambulatory Visit (INDEPENDENT_AMBULATORY_CARE_PROVIDER_SITE_OTHER): Payer: Medicare Other | Admitting: Gastroenterology

## 2022-12-13 NOTE — Telephone Encounter (Signed)
   Name: LAVERNE SERIGHT  DOB: Feb 14, 1945  MRN: [id number]  Primary Cardiologist: [name], MD   Preoperative team, please contact this patient and set up a phone call appointment for further preoperative risk assessment. Please obtain consent and complete medication review. Thank you for your help.  I confirm that guidance regarding antiplatelet and oral anticoagulation therapy has been completed and, if necessary, noted below.  Based on previous recommendations from primary cardiologist, impending no concerning symptoms at time of call, patient may hold Plavix for 5 days and resumed as soon as possible post-op. Regarding ASA therapy, we recommend continuation of ASA throughout the perioperative period.  However, if the surgeon feels that cessation of ASA is required in the perioperative period, it may be stopped 5-7 days prior to surgery with a plan to resume it as soon as felt to be feasible from a surgical standpoint in the post-operative period.   I also confirmed the patient resides in the state of [place]. As per [facility] Medical Board telemedicine laws, the patient must reside in the state in which the provider is licensed.   Denyce Robert, NP 12/13/2022, 4:45 PM [facility]

## 2022-12-13 NOTE — Telephone Encounter (Signed)
   Pre-operative Risk Assessment    Patient Name: [name]  DOB: 06/24/1945 MRN: [id number]  DATE OF LAST VISIT: 05/12/22 DR. Excell Seltzer DATE OF NEXT VISIT: NONE    Request for Surgical Clearance    Procedure:   EXCISION RIGHT WRIST DORSAL GANGLION   Date of Surgery:  Clearance 01/25/23                                 Surgeon:  DR. Betha Loa Surgeon's Group or Practice Name:  [facility] Phone number:  [phone] Fax number:  [phone]   Type of Clearance Requested:   - Medical  - Pharmacy:  Hold Aspirin and Clopidogrel (Plavix)     Type of Anesthesia:  Not Indicated   Additional requests/questions:    Elpidio Anis   12/13/2022, 3:27 PM

## 2022-12-14 NOTE — Telephone Encounter (Signed)
Left message to call back to set up tele pre op appt.  

## 2022-12-16 ENCOUNTER — Telehealth: Payer: Self-pay | Admitting: *Deleted

## 2022-12-16 NOTE — Telephone Encounter (Signed)
DPR ok to s/w the pt's daughter [name] who has scheduled tele pre op appt for the pt 01/13/23. Med rec and consent are done.      Patient Consent for Virtual Visit        [name] has provided verbal consent on 12/16/2022 for a virtual visit (video or telephone).   CONSENT FOR VIRTUAL VISIT FOR:  [name]  By participating in this virtual visit I agree to the following:  I hereby voluntarily request, consent and authorize [facility] and its employed or contracted physicians, physician assistants, nurse practitioners or other licensed health care professionals (the Practitioner), to provide me with telemedicine health care services (the "Services") as deemed necessary by the treating Practitioner. I acknowledge and consent to receive the Services by the Practitioner via telemedicine. I understand that the telemedicine visit will involve communicating with the Practitioner through live audiovisual communication technology and the disclosure of certain medical information by electronic transmission. I acknowledge that I have been given the opportunity to request an in-person assessment or other available alternative prior to the telemedicine visit and am voluntarily participating in the telemedicine visit.  I understand that I have the right to withhold or withdraw my consent to the use of telemedicine in the course of my care at any time, without affecting my right to future care or treatment, and that the Practitioner or I may terminate the telemedicine visit at any time. I understand that I have the right to inspect all information obtained and/or recorded in the course of the telemedicine visit and may receive copies of available information for a reasonable fee.  I understand that some of the potential risks of receiving the Services via telemedicine include:  Delay or interruption in medical evaluation due to technological equipment failure or disruption; Information  transmitted may not be sufficient (e.g. poor resolution of images) to allow for appropriate medical decision making by the Practitioner; and/or  In rare instances, security protocols could fail, causing a breach of personal health information.  Furthermore, I acknowledge that it is my responsibility to provide information about my medical history, conditions and care that is complete and accurate to the best of my ability. I acknowledge that Practitioner's advice, recommendations, and/or decision may be based on factors not within their control, such as incomplete or inaccurate data provided by me or distortions of diagnostic images or specimens that may result from electronic transmissions. I understand that the practice of medicine is not an exact science and that Practitioner makes no warranties or guarantees regarding treatment outcomes. I acknowledge that a copy of this consent can be made available to me via my patient portal [facility] MyChart), or I can request a printed copy by calling the office of [facility].    I understand that my insurance will be billed for this visit.   I have read or had this consent read to me. I understand the contents of this consent, which adequately explains the benefits and risks of the Services being provided via telemedicine.  I have been provided ample opportunity to ask questions regarding this consent and the Services and have had my questions answered to my satisfaction. I give my informed consent for the services to be provided through the use of telemedicine in my medical care

## 2022-12-16 NOTE — Telephone Encounter (Signed)
DPR ok to s/w the pt's daughter [name] who has scheduled tele pre op appt for the pt 01/13/23. Med rec and consent are done.

## 2022-12-20 ENCOUNTER — Encounter: Payer: Self-pay | Admitting: Urology

## 2022-12-20 ENCOUNTER — Ambulatory Visit: Payer: Medicare Other | Admitting: Urology

## 2022-12-20 ENCOUNTER — Telehealth: Payer: Self-pay

## 2022-12-20 VITALS — BP 155/73 | HR 61 | Ht 73.0 in | Wt 209.0 lb

## 2022-12-20 DIAGNOSIS — R8289 Other abnormal findings on cytological and histological examination of urine: Secondary | ICD-10-CM | POA: Diagnosis not present

## 2022-12-20 DIAGNOSIS — R350 Frequency of micturition: Secondary | ICD-10-CM

## 2022-12-20 DIAGNOSIS — Z8551 Personal history of malignant neoplasm of bladder: Secondary | ICD-10-CM

## 2022-12-20 DIAGNOSIS — C679 Malignant neoplasm of bladder, unspecified: Secondary | ICD-10-CM

## 2022-12-20 LAB — URINALYSIS, ROUTINE W REFLEX MICROSCOPIC
Bilirubin, UA: NEGATIVE
Glucose, UA: NEGATIVE
Ketones, UA: NEGATIVE
Nitrite, UA: NEGATIVE
Specific Gravity, UA: 1.03 (ref 1.005–1.030)
Urobilinogen, Ur: 0.2 mg/dL (ref 0.2–1.0)
pH, UA: 5.5 (ref 5.0–7.5)

## 2022-12-20 LAB — MICROSCOPIC EXAMINATION
Bacteria, UA: NONE SEEN
RBC, Urine: >30 /[HPF] — AB (ref 0–2)

## 2022-12-20 MED ORDER — CIPROFLOXACIN HCL 500 MG PO TABS
500.0000 mg | ORAL_TABLET | Freq: Once | ORAL | Status: AC
  Administered 2022-12-20: 500 mg via ORAL

## 2022-12-20 MED ORDER — SOLIFENACIN SUCCINATE 5 MG PO TABS: 5.0000 mg | ORAL_TABLET | Freq: Every day | ORAL | 11 refills | Status: DC

## 2022-12-20 NOTE — Patient Instructions (Signed)

## 2022-12-20 NOTE — Telephone Encounter (Signed)
Patient needing a cheaper form of  ciprofloxacin (CIPRO).  Too much out of pocket cost.  Please advise.

## 2022-12-20 NOTE — Progress Notes (Signed)
   12/20/22  CC: Followup bladder cancer   HPI: [name] is a 77yo here for followup for muscle invasive bladder cancer first treated 03/2022 Blood pressure (!) 155/73, pulse 61, height 6\' 1"  (1.854 m), weight 209 lb (94.8 kg). NED. A&Ox3.   No respiratory distress   Abd soft, NT, ND Normal phallus with bilateral descended testicles  Cystoscopy Procedure Note  Patient identification was confirmed, informed consent was obtained, and patient was prepped using Betadine solution.  Lidocaine jelly was administered per urethral meatus.     Pre-Procedure: - Inspection reveals a normal caliber ureteral meatus.  Procedure: The flexible cystoscope was introduced without difficulty - No urethral strictures/lesions are present. - Normal prostate  - Normal bladder neck - Bilateral ureteral orifices identified - Bladder mucosa  reveals no ulcers, tumors, or lesions - No bladder stones - No trabeculation    Post-Procedure: - Patient tolerated the procedure well  Assessment/ Plan: Urine for cytology. Followup 3 months for cystoscopy. We will trial vesicare 5mg  daily for urinary frequency  No follow-ups on file.  Wilkie Aye, MD

## 2022-12-20 NOTE — Telephone Encounter (Signed)
Per patient chart the medication is vesicare not cipro.  I contacted pt pharmacy, they will fill 90 rx at 50$.  I informed patient, he agreed this is more affordable and will follow up with pharmacy.

## 2022-12-21 ENCOUNTER — Other Ambulatory Visit: Payer: Self-pay

## 2022-12-22 LAB — CYTOLOGY, URINE

## 2022-12-26 ENCOUNTER — Other Ambulatory Visit: Payer: Self-pay | Admitting: Nurse Practitioner

## 2022-12-28 ENCOUNTER — Telehealth: Payer: Self-pay

## 2022-12-28 ENCOUNTER — Other Ambulatory Visit: Payer: Self-pay | Admitting: Orthopedic Surgery

## 2022-12-28 ENCOUNTER — Other Ambulatory Visit: Payer: Medicare Other

## 2022-12-28 DIAGNOSIS — N3289 Other specified disorders of bladder: Secondary | ICD-10-CM | POA: Diagnosis not present

## 2022-12-28 DIAGNOSIS — C679 Malignant neoplasm of bladder, unspecified: Secondary | ICD-10-CM | POA: Diagnosis not present

## 2022-12-28 NOTE — Telephone Encounter (Signed)
-----   Message from Wilkie Aye sent at 12/28/2022  8:39 AM EST ----- Please send for [facility] ----- Message ----- From: Nell Range Lab Results In Sent: 12/20/2022   3:35 PM EST To: Malen Gauze, MD

## 2022-12-28 NOTE — Telephone Encounter (Signed)
Please see pt request below.

## 2022-12-28 NOTE — Telephone Encounter (Signed)
Patient needing to let Dr. Ronne Binning know the Rx prescribed is not working as well as he had hoped.  Wondering if the dosage may be increased a little?  Please advise.

## 2022-12-28 NOTE — Telephone Encounter (Signed)
Urine sent for [facility] cytology, scheduled for pick up on 12/29/2022.  Tracking [id number]

## 2022-12-29 NOTE — Progress Notes (Signed)
Opened in error

## 2022-12-29 NOTE — Telephone Encounter (Signed)
Patient requested to increase of vesicare, verbal from Dr. Ronne Binning patient needs to continue rx for 30 days.  If no improvement after 30 days he will increase from 5mg  to 10mg .  Patient voiced understanding.

## 2023-01-04 DIAGNOSIS — J069 Acute upper respiratory infection, unspecified: Secondary | ICD-10-CM | POA: Diagnosis not present

## 2023-01-06 ENCOUNTER — Encounter: Payer: Self-pay | Admitting: Urology

## 2023-01-13 ENCOUNTER — Encounter: Payer: Self-pay | Admitting: Nurse Practitioner

## 2023-01-13 ENCOUNTER — Ambulatory Visit: Payer: Medicare Other | Attending: Nurse Practitioner | Admitting: Nurse Practitioner

## 2023-01-13 DIAGNOSIS — Z01810 Encounter for preprocedural cardiovascular examination: Secondary | ICD-10-CM | POA: Diagnosis not present

## 2023-01-13 NOTE — Progress Notes (Signed)
 Virtual Visit via Telephone Note   Because of Iver Miklas Mcferran's co-morbid illnesses, he is at least at moderate risk for complications without adequate follow up.  This format is felt to be most appropriate for this patient at this time.  The patient did not have access to video technology/had technical difficulties with video requiring transitioning to audio format only (telephone).  All issues noted in this document were discussed and addressed.  No physical exam could be performed with this format.  Please refer to the patient's chart for his consent to telehealth for [facility].  Evaluation Performed:  Preoperative cardiovascular risk assessment _____________   Date:  01/13/2023   Patient ID:  OLLIVER BOYADJIAN, DOB [birth year], MRN [id number] Patient Location:  Home Provider location:   Office  Primary Care Provider:  Marvine Rush, MD Primary Cardiologist:  Ozell Fell, MD  Chief Complaint / Patient Profile   78 y.o. y/o male with a h/o CAD s/p CABG x 2 in 1997 with LIMA to LAD and RIMA to RCA graft, PCI in 2017 with overlapping DES in distal RCA through RIMA graft, stenting of native LM and Cx, bladder cancer, dyslipidemia, OSA on CPAP, hypertension, nonrheumatic aortic valve stenosis who is pending excision right wrist dorsal ganglion with Dr. Murrell on 01/25/2023 and presents today for telephonic preoperative cardiovascular risk assessment.  History of Present Illness    BRAYTEN KOMAR is a 78 y.o. male who presents via audio/video conferencing for a telehealth visit today.  Pt was last seen in cardiology clinic on 05/12/2022 by Dr. Fell.  At that time JAKARRI LESKO was doing well.  The patient is now pending procedure as outlined above. Since his last visit, he denies chest pain, shortness of breath, lower extremity edema, fatigue, palpitations, melena, hematuria, hemoptysis, diaphoresis, weakness, presyncope, syncope, orthopnea, and PND. He remains active  with regular walking for exercise and house and yard work. He denies any concerning cardiac symptoms.   Past Medical History    Past Medical History:  Diagnosis Date   Anginal pain (HCC)    Arthritis    all over   Bladder tumor    Chronic lower back pain    Coronary artery disease    a. s/p CABG x 2 (LIMA->LAD, RIMA->RCA);  b. s/p multiple PCI's to Ramus;  c. 08/2011 Cath/PCI: LM 70% into ramus with 70-80% there->treated wtih 3.5x18 Xience Xpedition DES, LCX  nonobs, RCA occluded.  RIMA & LIMA patent, EF 55-65%   DJD (degenerative joint disease)    GERD (gastroesophageal reflux disease)    History of gout    Hyperlipidemia    Hypertension    Hypothyroidism    Neuropathy    OSA on CPAP    Prostate cancer [facility])    Past Surgical History:  Procedure Laterality Date   ABDOMINAL HERNIA REPAIR     BIOPSY  11/26/2015   Procedure: BIOPSY;  Surgeon: Claudis RAYMOND Rivet, MD;  [facility];  Service: Endoscopy;;  gastric esophagus   BIOPSY  01/20/2021   Procedure: BIOPSY;  Surgeon: Eartha Angelia Sieving, MD;  [facility];  Service: Gastroenterology;;   BIOPSY  10/28/2021   Procedure: BIOPSY;  Surgeon: Eartha Angelia Sieving, MD;  [facility];  Service: Gastroenterology;;   BLADDER TUMOR EXCISION     BROW LIFT Bilateral 04/27/2019   Procedure: BILATERAL BLEPHAROPLASTY;  Surgeon: Harrie Agent, MD;  Location: [facility];  Service: Ophthalmology;  Laterality: Bilateral;   CARDIAC CATHETERIZATION  N/A 04/10/2015   Procedure: Left Heart Cath and Cors/Grafts Angiography;  Surgeon: Ozell Fell, MD;  Location: [facility] INVASIVE CV LAB;  Service: Cardiovascular;  Laterality: N/A;   CARDIAC CATHETERIZATION N/A 04/10/2015   Procedure: Coronary Stent Intervention;  Surgeon: Ozell Fell, MD;  Location: [facility] INVASIVE CV LAB;  Service: Cardiovascular;  Laterality: N/A;   CATARACT EXTRACTION W/PHACO Right 06/23/2015   Procedure: CATARACT EXTRACTION PHACO AND INTRAOCULAR LENS  PLACEMENT RIGHT EYE; CDE:  7.08;  Surgeon: Cherene Mania, MD;  Location: [facility];  Service: Ophthalmology;  Laterality: Right;   CATARACT EXTRACTION W/PHACO Left 08/25/2015   Procedure: CATARACT EXTRACTION PHACO AND INTRAOCULAR LENS PLACEMENT LEFT EYE; CDE:  8.18;  Surgeon: Cherene Mania, MD;  Location: [facility];  Service: Ophthalmology;  Laterality: Left;   COLONOSCOPY N/A 12/28/2012   Procedure: COLONOSCOPY;  Surgeon: Claudis RAYMOND Rivet, MD;  [facility];  Service: Endoscopy;  Laterality: N/A;  830 rescheduled   COLONOSCOPY WITH PROPOFOL  N/A 01/20/2021   Procedure: COLONOSCOPY WITH PROPOFOL ;  Surgeon: Eartha Angelia Sieving, MD;  [facility];  Service: Gastroenterology;  Laterality: N/A;  11:05   CORONARY ANGIOPLASTY WITH STENT PLACEMENT  2013; 04/10/2015   this makes me a total of 7 (04/10/2015)   CORONARY ARTERY BYPASS GRAFT  1997   with (LIMA)   ESOPHAGEAL DILATION N/A 11/26/2015   Procedure: ESOPHAGEAL DILATION;  Surgeon: Claudis RAYMOND Rivet, MD;  [facility];  Service: Endoscopy;  Laterality: N/A;   ESOPHAGEAL DILATION N/A 05/23/2017   Procedure: ESOPHAGEAL DILATION;  Surgeon: Rivet Claudis RAYMOND, MD;  [facility];  Service: Endoscopy;  Laterality: N/A;   ESOPHAGEAL DILATION N/A 06/16/2017   Procedure: ESOPHAGEAL DILATION;  Surgeon: Rivet Claudis RAYMOND, MD;  [facility];  Service: Endoscopy;  Laterality: N/A;   ESOPHAGEAL DILATION  10/28/2021   Procedure: ESOPHAGEAL DILATION;  Surgeon: Eartha Angelia Sieving, MD;  [facility];  Service: Gastroenterology;;   ESOPHAGOGASTRODUODENOSCOPY N/A 11/26/2015   Procedure: ESOPHAGOGASTRODUODENOSCOPY (EGD);  Surgeon: Claudis RAYMOND Rivet, MD;  [facility];  Service: Endoscopy;  Laterality: N/A;  1:25   ESOPHAGOGASTRODUODENOSCOPY N/A 05/23/2017   Procedure: ESOPHAGOGASTRODUODENOSCOPY (EGD);  Surgeon: Rivet Claudis RAYMOND, MD;  [facility];  Service: Endoscopy;  Laterality: N/A;  1:15    ESOPHAGOGASTRODUODENOSCOPY N/A 06/16/2017   Procedure: ESOPHAGOGASTRODUODENOSCOPY (EGD);  Surgeon: Rivet Claudis RAYMOND, MD;  [facility];  Service: Endoscopy;  Laterality: N/A;  1240   ESOPHAGOGASTRODUODENOSCOPY (EGD) WITH PROPOFOL  N/A 01/20/2021   Procedure: ESOPHAGOGASTRODUODENOSCOPY (EGD) WITH PROPOFOL ;  Surgeon: Eartha Angelia Sieving, MD;  [facility];  Service: Gastroenterology;  Laterality: N/A;   ESOPHAGOGASTRODUODENOSCOPY (EGD) WITH PROPOFOL  N/A 10/28/2021   Procedure: ESOPHAGOGASTRODUODENOSCOPY (EGD) WITH PROPOFOL ;  Surgeon: Eartha Angelia Sieving, MD;  [facility];  Service: Gastroenterology;  Laterality: N/A;  215 ASA 2, pt will arrive at 10:45   HERNIA REPAIR Left    IR IMAGING GUIDED PORT INSERTION  02/24/2022   KNEE CARTILAGE SURGERY Bilateral    LEFT HEART CATH AND CORS/GRAFTS ANGIOGRAPHY N/A 09/08/2016   Procedure: LEFT HEART CATH AND CORS/GRAFTS ANGIOGRAPHY;  Surgeon: Jordan, Peter M, MD;  Location: [facility] INVASIVE CV LAB;  Service: Cardiovascular;  Laterality: N/A;   LEFT HEART CATHETERIZATION WITH CORONARY ANGIOGRAM N/A 09/08/2011   Procedure: LEFT HEART CATHETERIZATION WITH CORONARY ANGIOGRAM;  Surgeon: Ozell Fell, MD;  Location: [facility] CATH LAB;  Service: Cardiovascular;  Laterality: N/A;   PERCUTANEOUS CORONARY STENT INTERVENTION (PCI-S) Right 09/08/2011  Procedure: PERCUTANEOUS CORONARY STENT INTERVENTION (PCI-S);  Surgeon: Ozell Fell, MD;  Location: [facility] CATH LAB;  Service: Cardiovascular;  Laterality: Right;   POLYPECTOMY  01/20/2021   Procedure: POLYPECTOMY;  Surgeon: Eartha Angelia Sieving, MD;  [facility];  Service: Gastroenterology;;   ROBOT ASSISTED LAPAROSCOPIC RADICAL PROSTATECTOMY      Allergies  Allergies  Allergen Reactions   Codeine Nausea Only   Pollen Extract Other (See Comments)    Seasonal    Home Medications    Prior to Admission medications   Medication Sig Start Date End Date Taking? Authorizing  Provider  amLODipine  (NORVASC ) 10 MG tablet Take 10 mg by mouth in the morning. 04/20/20   [provider]  aspirin  EC 81 MG tablet Take 81 mg by mouth every evening. 06/17/17   Golda Claudis PENNER, MD  atorvastatin  (LIPITOR) 40 MG tablet TAKE 1/2 TABLET(20 MG) BY MOUTH EVERY EVENING 06/30/22   Fell Ozell, MD  carvedilol  (COREG ) 6.25 MG tablet TAKE 1 TABLET(6.25 MG) BY MOUTH TWICE DAILY 12/28/22   Cooper, Michael, MD  cetirizine (ZYRTEC) 10 MG tablet Take 10 mg by mouth every evening.    [provider]  cholecalciferol (VITAMIN D3) 25 MCG (1000 UNIT) tablet Take 1,000 Units by mouth every evening.    [provider]  clopidogrel  (PLAVIX ) 75 MG tablet Take 1 tablet (75 mg total) by mouth in the morning. 09/24/20   Bhagat, Bhavinkumar, PA  Coenzyme Q10 300 MG CAPS Take 300 mg by mouth every evening.    [provider]  cyanocobalamin  (,VITAMIN B-12,) 1000 MCG/ML injection Inject 1,000 mcg into the muscle See admin instructions. Inject (1000 mcg) intramuscularly once monthly as needed for B-12 deficiency. 03/25/15   [provider]  fenofibrate  160 MG tablet TAKE 1 TABLET(160 MG) BY MOUTH DAILY 08/05/22   Miriam Norris, NP  FEROSUL 325 (65 Fe) MG tablet TAKE 1 TABLET(325 MG) BY MOUTH TWICE DAILY WITH A MEAL 01/13/22   Eartha Angelia Sieving, MD  gabapentin  (NEURONTIN ) 600 MG tablet Take 600 mg by mouth 3 (three) times daily. 02/27/15   [provider]  GLUCOSAMINE-CHONDROITIN PO Take 1 tablet by mouth every evening.    [provider]  isosorbide  mononitrate (IMDUR ) 60 MG 24 hr tablet TAKE 1 AND 1/2 TABLETS(90 MG) BY MOUTH DAILY 10/13/22   Miriam Norris, NP  levothyroxine  (SYNTHROID ) 175 MCG tablet Take 175 mcg by mouth daily. 09/30/22   [provider]  meclizine  (ANTIVERT ) 25 MG tablet Take 1 tablet (25 mg total) by mouth 2 (two) times daily as needed for dizziness. Patient taking differently: Take 12.5 mg by mouth at bedtime as  needed for dizziness. 05/29/20   Lue Elsie BROCKS, MD  nitroGLYCERIN  (NITROSTAT ) 0.4 MG SL tablet Place 1 tablet (0.4 mg total) under the tongue every 5 (five) minutes as needed for chest pain. 12/31/21   Fell Ozell, MD  Polyethyl Glycol-Propyl Glycol (LUBRICANT EYE DROPS) 0.4-0.3 % SOLN Place 1-2 drops into both eyes 3 (three) times daily as needed (dry/irritated eyes.).    [provider]  prochlorperazine  (COMPAZINE ) 10 MG tablet Take 1 tablet (10 mg total) by mouth every 6 (six) hours as needed for nausea or vomiting. 04/01/22   Rogers Hai, MD  solifenacin  (VESICARE ) 5 MG tablet Take 1 tablet (5 mg total) by mouth daily. 12/20/22   McKenzie, Belvie CROME, MD  traMADol  (ULTRAM ) 50 MG tablet Take 50 mg by mouth 3 (three) times daily. 12/24/13   [provider]  Vibegron  (GEMTESA ) 75 MG TABS Take 1 tablet (75 mg total) by mouth daily. 08/04/22   McKenzie, Belvie CROME, MD  Vibegron  (GEMTESA ) 75 MG TABS Take 1 tablet (75 mg total) by mouth daily. 11/24/22   Sherrilee Belvie CROME, MD    Physical Exam    Vital Signs:  Elsie CHRISTELLA Rather does not have vital signs available for review today.  Given telephonic nature of communication, physical exam is limited. AAOx3. NAD. Normal affect.  Speech and respirations are unlabored.  Accessory Clinical Findings    None  Assessment & Plan    1.  Preoperative Cardiovascular Risk Assessment: According to the Revised Cardiac Risk Index (RCRI), his Perioperative Risk of Major Cardiac Event is (%): 0.9. His Functional Capacity in METs is: 8.33 according to the Duke Activity Status Index (DASI). The patient is doing well from a cardiac perspective. Therefore, based on ACC/AHA guidelines, the patient would be at acceptable risk for the planned procedure without further cardiovascular testing.   The patient was advised that if he develops new symptoms prior to surgery to contact our office to arrange for a follow-up visit, and he  verbalized understanding.  Based on previous recommendations from primary cardiologist, patient may hold Plavix  for 5 days and resume as soon as possible post-op. Regarding ASA therapy, we recommend continuation of ASA throughout the perioperative period.  However, if the surgeon feels that cessation of ASA is required in the perioperative period, it may be stopped 5-7 days prior to surgery with a plan to resume it as soon as felt to be feasible from a surgical standpoint in the post-operative period.   A copy of this note will be routed to requesting surgeon.  Time:   Today, I have spent 10 minutes with the patient with telehealth technology discussing medical history, symptoms, and management plan.     Rosaline EMERSON Bane, NP-C  01/13/2023, 2:19 PM [address], [address] Office [phone] Fax [phone]

## 2023-01-18 ENCOUNTER — Other Ambulatory Visit: Payer: Self-pay

## 2023-01-18 ENCOUNTER — Encounter (HOSPITAL_BASED_OUTPATIENT_CLINIC_OR_DEPARTMENT_OTHER): Payer: Self-pay | Admitting: Orthopedic Surgery

## 2023-01-18 NOTE — Progress Notes (Signed)
   01/18/23 1127  PAT Phone Screen  Is the patient taking a GLP-1 receptor agonist? No  Do You Have Diabetes? No  Do You Have Hypertension? Yes  Have You Ever Been to the ER for Asthma? No  Have You Taken Oral Steroids in the Past 3 Months? No  Do you Take Phenteramine or any Other Diet Drugs? No  Recent  Lab Work, EKG, CXR? Yes  Where was this test performed? 10-09-21 ECHO EF 65-70%, mild aortic stenosis, 10-12-21 Myocardial stress test-intermed risk  Do you have a history of heart problems? (S)  Yes (CAD-CABGx2)  Any Recent Hospitalizations? No  Height 6' 1 (1.854 m)  Weight 95.3 kg  Pat Appointment Scheduled (S)  Yes (EKG)

## 2023-01-19 ENCOUNTER — Encounter (HOSPITAL_BASED_OUTPATIENT_CLINIC_OR_DEPARTMENT_OTHER)
Admission: RE | Admit: 2023-01-19 | Discharge: 2023-01-19 | Disposition: A | Discharge status: Home / Self Care | Payer: Medicare Other | Source: Ambulatory Visit | Attending: Orthopedic Surgery | Admitting: Orthopedic Surgery

## 2023-01-19 DIAGNOSIS — Z01810 Encounter for preprocedural cardiovascular examination: Secondary | ICD-10-CM | POA: Diagnosis not present

## 2023-01-19 DIAGNOSIS — I10 Essential (primary) hypertension: Secondary | ICD-10-CM | POA: Diagnosis not present

## 2023-01-19 NOTE — Progress Notes (Signed)
 Gave patient presurgical drink and instructed him to finish it by 0930 on the day of surgery. Pt verbalized understanding

## 2023-01-20 ENCOUNTER — Telehealth: Payer: Self-pay

## 2023-01-20 NOTE — Telephone Encounter (Signed)
 Pt advised to discontinue medication for now while we wait to hear from MD on what alternative Pt can take

## 2023-01-20 NOTE — Telephone Encounter (Signed)
 Pt has taken solifenacin (VESICARE) 5 MG tablet for 30 days.    Not helping issues. Might be making worse.  Please advise.  Call:  [phone]

## 2023-01-20 NOTE — Telephone Encounter (Signed)
 Called Pt to relay message from Lauraine NP said Looks like he previously tried Myrbetriq  50 mg per phone note on 06/02/2022 - was that better? If so we can restart that or he can stop by for samples of Gemtesa  to try. Howeve Pt didn't answer and vm is full will send mychart message

## 2023-01-25 ENCOUNTER — Encounter (HOSPITAL_BASED_OUTPATIENT_CLINIC_OR_DEPARTMENT_OTHER): Payer: Self-pay | Admitting: Orthopedic Surgery

## 2023-01-25 ENCOUNTER — Encounter (HOSPITAL_BASED_OUTPATIENT_CLINIC_OR_DEPARTMENT_OTHER)
Admission: RE | Disposition: A | Discharge status: Home / Self Care | Payer: Self-pay | Source: Home / Self Care | Attending: Orthopedic Surgery

## 2023-01-25 ENCOUNTER — Ambulatory Visit (HOSPITAL_BASED_OUTPATIENT_CLINIC_OR_DEPARTMENT_OTHER): Payer: Medicare Other | Admitting: Anesthesiology

## 2023-01-25 ENCOUNTER — Ambulatory Visit (HOSPITAL_BASED_OUTPATIENT_CLINIC_OR_DEPARTMENT_OTHER)
Admission: RE | Admit: 2023-01-25 | Discharge: 2023-01-25 | Disposition: A | Discharge status: Home / Self Care | Payer: Medicare Other | Attending: Orthopedic Surgery | Admitting: Orthopedic Surgery

## 2023-01-25 ENCOUNTER — Other Ambulatory Visit: Payer: Self-pay

## 2023-01-25 DIAGNOSIS — I2510 Atherosclerotic heart disease of native coronary artery without angina pectoris: Secondary | ICD-10-CM | POA: Insufficient documentation

## 2023-01-25 DIAGNOSIS — Z955 Presence of coronary angioplasty implant and graft: Secondary | ICD-10-CM | POA: Insufficient documentation

## 2023-01-25 DIAGNOSIS — I10 Essential (primary) hypertension: Secondary | ICD-10-CM | POA: Insufficient documentation

## 2023-01-25 DIAGNOSIS — E039 Hypothyroidism, unspecified: Secondary | ICD-10-CM | POA: Diagnosis not present

## 2023-01-25 DIAGNOSIS — M65841 Other synovitis and tenosynovitis, right hand: Secondary | ICD-10-CM | POA: Diagnosis not present

## 2023-01-25 DIAGNOSIS — M67431 Ganglion, right wrist: Secondary | ICD-10-CM | POA: Insufficient documentation

## 2023-01-25 DIAGNOSIS — Z951 Presence of aortocoronary bypass graft: Secondary | ICD-10-CM | POA: Insufficient documentation

## 2023-01-25 DIAGNOSIS — G4733 Obstructive sleep apnea (adult) (pediatric): Secondary | ICD-10-CM | POA: Diagnosis not present

## 2023-01-25 DIAGNOSIS — Z87891 Personal history of nicotine dependence: Secondary | ICD-10-CM | POA: Insufficient documentation

## 2023-01-25 DIAGNOSIS — Z79899 Other long term (current) drug therapy: Secondary | ICD-10-CM | POA: Diagnosis not present

## 2023-01-25 DIAGNOSIS — M67441 Ganglion, right hand: Secondary | ICD-10-CM | POA: Diagnosis not present

## 2023-01-25 HISTORY — PX: GANGLION CYST EXCISION: SHX1691

## 2023-01-25 HISTORY — DX: Malignant neoplasm of bladder, unspecified: C67.9

## 2023-01-25 SURGERY — EXCISION, GANGLION CYST, WRIST
Anesthesia: General | Site: Wrist | Laterality: Right

## 2023-01-25 MED ORDER — CEFAZOLIN SODIUM-DEXTROSE 2-4 GM/100ML-% IV SOLN
INTRAVENOUS | Status: AC
  Filled 2023-01-25: qty 100

## 2023-01-25 MED ORDER — SODIUM CHLORIDE 0.9 % IV SOLN: INTRAVENOUS | Status: DC | PRN

## 2023-01-25 MED ORDER — LACTATED RINGERS IV SOLN: INTRAVENOUS | Status: DC

## 2023-01-25 MED ORDER — TRAMADOL HCL 50 MG PO TABS: 50.0000 mg | ORAL_TABLET | Freq: Four times a day (QID) | ORAL | 0 refills | Status: DC | PRN

## 2023-01-25 MED ORDER — LIDOCAINE 2% (20 MG/ML) 5 ML SYRINGE
INTRAMUSCULAR | Status: DC | PRN
  Administered 2023-01-25: 60 mg via INTRAVENOUS

## 2023-01-25 MED ORDER — AMISULPRIDE (ANTIEMETIC) 5 MG/2ML IV SOLN: 10.0000 mg | Freq: Once | INTRAVENOUS | Status: DC | PRN

## 2023-01-25 MED ORDER — PROPOFOL 10 MG/ML IV BOLUS
INTRAVENOUS | Status: DC | PRN
  Administered 2023-01-25: 50 mg via INTRAVENOUS
  Administered 2023-01-25: 150 mg via INTRAVENOUS

## 2023-01-25 MED ORDER — CEFAZOLIN SODIUM-DEXTROSE 2-4 GM/100ML-% IV SOLN
2.0000 g | INTRAVENOUS | Status: AC
  Administered 2023-01-25: 2 g via INTRAVENOUS

## 2023-01-25 MED ORDER — DEXAMETHASONE SODIUM PHOSPHATE 10 MG/ML IJ SOLN
INTRAMUSCULAR | Status: AC
  Filled 2023-01-25: qty 1

## 2023-01-25 MED ORDER — LIDOCAINE 2% (20 MG/ML) 5 ML SYRINGE
INTRAMUSCULAR | Status: AC
  Filled 2023-01-25: qty 5

## 2023-01-25 MED ORDER — ONDANSETRON HCL 4 MG/2ML IJ SOLN
INTRAMUSCULAR | Status: DC | PRN
  Administered 2023-01-25: 4 mg via INTRAVENOUS

## 2023-01-25 MED ORDER — FENTANYL CITRATE (PF) 100 MCG/2ML IJ SOLN
INTRAMUSCULAR | Status: AC
  Filled 2023-01-25: qty 2

## 2023-01-25 MED ORDER — DEXAMETHASONE SODIUM PHOSPHATE 4 MG/ML IJ SOLN
INTRAMUSCULAR | Status: DC | PRN
  Administered 2023-01-25: 10 mg via INTRAVENOUS

## 2023-01-25 MED ORDER — ACETAMINOPHEN 500 MG PO TABS: 1000.0000 mg | ORAL_TABLET | Freq: Once | ORAL | Status: DC

## 2023-01-25 MED ORDER — FENTANYL CITRATE (PF) 100 MCG/2ML IJ SOLN
INTRAMUSCULAR | Status: DC | PRN
  Administered 2023-01-25: 50 ug via INTRAVENOUS
  Administered 2023-01-25 (×2): 25 ug via INTRAVENOUS

## 2023-01-25 MED ORDER — FENTANYL CITRATE (PF) 100 MCG/2ML IJ SOLN: 25.0000 ug | INTRAMUSCULAR | Status: DC | PRN

## 2023-01-25 MED ORDER — BUPIVACAINE HCL (PF) 0.25 % IJ SOLN
INTRAMUSCULAR | Status: DC | PRN
  Administered 2023-01-25: 9 mL

## 2023-01-25 MED ORDER — PROPOFOL 500 MG/50ML IV EMUL
INTRAVENOUS | Status: AC
  Filled 2023-01-25: qty 100

## 2023-01-25 MED ORDER — 0.9 % SODIUM CHLORIDE (POUR BTL) OPTIME
TOPICAL | Status: DC | PRN
  Administered 2023-01-25: 100 mL

## 2023-01-25 MED ORDER — ONDANSETRON HCL 4 MG/2ML IJ SOLN
INTRAMUSCULAR | Status: AC
  Filled 2023-01-25: qty 2

## 2023-01-25 SURGICAL SUPPLY — 32 items
BLADE SURG 15 STRL LF DISP TIS (BLADE) ×2 IMPLANT
BNDG ELASTIC 3INX 5YD STR LF (GAUZE/BANDAGES/DRESSINGS) ×1 IMPLANT
BNDG ESMARK 4X9 LF (GAUZE/BANDAGES/DRESSINGS) IMPLANT
BNDG GAUZE DERMACEA FLUFF 4 (GAUZE/BANDAGES/DRESSINGS) ×1 IMPLANT
CHLORAPREP W/TINT 26 (MISCELLANEOUS) ×1 IMPLANT
CORD BIPOLAR FORCEPS 12FT (ELECTRODE) ×1 IMPLANT
COVER BACK TABLE 60X90IN (DRAPES) ×1 IMPLANT
COVER MAYO STAND STRL (DRAPES) ×1 IMPLANT
CUFF TOURN SGL QUICK 18X4 (TOURNIQUET CUFF) ×1 IMPLANT
DRAPE EXTREMITY T 121X128X90 (DISPOSABLE) ×1 IMPLANT
DRAPE SURG 17X23 STRL (DRAPES) ×1 IMPLANT
GAUZE PAD ABD 8X10 STRL (GAUZE/BANDAGES/DRESSINGS) IMPLANT
GAUZE SPONGE 4X4 12PLY STRL (GAUZE/BANDAGES/DRESSINGS) ×1 IMPLANT
GAUZE XEROFORM 1X8 LF (GAUZE/BANDAGES/DRESSINGS) ×1 IMPLANT
GLOVE BIO SURGEON STRL SZ7.5 (GLOVE) ×1 IMPLANT
GLOVE BIOGEL PI IND STRL 7.0 (GLOVE) IMPLANT
GLOVE BIOGEL PI IND STRL 7.5 (GLOVE) IMPLANT
GLOVE BIOGEL PI IND STRL 8 (GLOVE) ×1 IMPLANT
GOWN STRL REUS W/ TWL LRG LVL3 (GOWN DISPOSABLE) ×1 IMPLANT
GOWN STRL REUS W/TWL XL LVL3 (GOWN DISPOSABLE) ×1 IMPLANT
NDL HYPO 25X1 1.5 SAFETY (NEEDLE) IMPLANT
NEEDLE HYPO 25X1 1.5 SAFETY (NEEDLE) ×1
NS IRRIG 1000ML POUR BTL (IV SOLUTION) ×1 IMPLANT
PACK BASIN DAY SURGERY FS (CUSTOM PROCEDURE TRAY) ×1 IMPLANT
PADDING CAST ABS COTTON 4X4 ST (CAST SUPPLIES) ×1 IMPLANT
STOCKINETTE 4X48 STRL (DRAPES) ×1 IMPLANT
STRIP CLOSURE SKIN 1/2X4 (GAUZE/BANDAGES/DRESSINGS) IMPLANT
SUT ETHILON 4 0 PS 2 18 (SUTURE) IMPLANT
SYR BULB EAR ULCER 3OZ GRN STR (SYRINGE) ×1 IMPLANT
SYR CONTROL 10ML LL (SYRINGE) IMPLANT
TOWEL GREEN STERILE FF (TOWEL DISPOSABLE) ×2 IMPLANT
UNDERPAD 30X36 HEAVY ABSORB (UNDERPADS AND DIAPERS) ×1 IMPLANT

## 2023-01-25 NOTE — Op Note (Addendum)
 NAME: Kamdin Follett [facility] MEDICAL RECORD NO: [id number] DATE OF BIRTH: 1945-06-17 FACILITY: [facility] LOCATION: [facility] PHYSICIAN: Jourdyn Hasler R. Raequon Catanzaro, MD   OPERATIVE REPORT   DATE OF PROCEDURE: 01/25/23    PREOPERATIVE DIAGNOSIS: Right wrist ganglion   POSTOPERATIVE DIAGNOSIS: Right wrist third dorsal compartment tenosynovitis/ganglion   PROCEDURE: Right wrist/hand tenosynovectomy/excision ganglion third dorsal compartment   SURGEON:  Franky Curia, M.D.   ASSISTANT: none   ANESTHESIA:  General   INTRAVENOUS FLUIDS:  Per anesthesia flow sheet.   ESTIMATED BLOOD LOSS:  Minimal.   COMPLICATIONS:  None.   SPECIMENS: Right wrist tenosynovitis to pathology   TOURNIQUET TIME:    Total Tourniquet Time Documented: Upper Arm (Right) - 21 minutes Total: Upper Arm (Right) - 21 minutes    DISPOSITION:  Stable to PACU.   INDICATIONS: 78 year old male with mass on the dorsum of the right hand.  It is bothersome to him.  He wishes to have it removed.  Risks, benefits and alternatives of surgery were discussed including the risks of blood loss, infection, damage to nerves, vessels, tendons, ligaments, bone for surgery, need for additional surgery, complications with wound healing, continued pain, stiffness, , recurrence.  He voiced understanding of these risks and elected to proceed.  OPERATIVE COURSE:  After being identified preoperatively by myself,  the patient and I agreed on the procedure and site of the procedure.  The surgical site was marked.  Surgical consent had been signed. Preoperative IV antibiotic prophylaxis was given. He was transferred to the operating room and placed on the operating table in supine position with the Right upper extremity on an arm board.  General anesthesia was induced by the anesthesiologist.  Right upper extremity was prepped and draped in normal sterile orthopedic fashion.  A surgical pause was performed between the surgeons, anesthesia, and  operating room staff and all were in agreement as to the patient, procedure, and site of procedure.  Tourniquet at the proximal aspect of the extremity was inflated to 250 mmHg after exsanguination of the arm with an Esmarch bandage.  Incision was made over the mass on the dorsum of the hand overlying the thumb index webspace and thumb metacarpal.  This was carried in subcutaneous tissues by spreading technique.  Dorsal sensory branch of radial nerve was identified and protected throughout the case.  The mass was easily identified.  It was carefully freed up from surrounding soft tissues.  It was filled with clear gelatinous fluid and adherent to the EPL tendon.  It was carefully removed.  It was coursing along the EPL tendon from the wrist.  The incision was extended proximally to aid in visualization.  The synovium was peeled off back to the level of the retinaculum where it was removed.  There did not appear to be a stalk coming from the joint.  The tenosynovium was sent to pathology for examination.  The wound was copiously irrigated with sterile saline and closed with 4-0 nylon in a horizontal mattress fashion.  Was injected with quarter percent plain Marcaine  to aid in postoperative analgesia.  It was then dressed with sterile Xeroform and 4 x 4's and an ABD used as a splint.  This was wrapped with Kerlix and Ace bandage.  The tourniquet was deflated at 21 minutes.  Fingertips were pink with brisk capillary refill after deflation of tourniquet.  The operative  drapes were broken down.  The patient was awoken from anesthesia safely.  He was transferred back to the stretcher  and taken to PACU in stable condition.  I will see him back in the office in 1 week for postoperative followup.  I will give him a prescription for Tramadol  50 mg 1 tab PO q6 hours prn pain, dispense # 20.   Westyn Driggers, MD Electronically signed, 01/25/23  Addendum (01/28/23): To clarify post op diagnosis and procedure based on  pathology findings.

## 2023-01-25 NOTE — Anesthesia Procedure Notes (Signed)
 Procedure Name: LMA Insertion Date/Time: 01/25/2023 1:32 PM  Performed by: Franchot, Dvonte Gatliff W, CRNAPre-anesthesia Checklist: Patient identified, Emergency Drugs available, Suction available and Patient being monitored Patient Re-evaluated:Patient Re-evaluated prior to induction Oxygen Delivery Method: Circle system utilized Preoxygenation: Pre-oxygenation with 100% oxygen Induction Type: IV induction Ventilation: Mask ventilation without difficulty LMA: LMA inserted LMA Size: 5.0 Number of attempts: 1 Placement Confirmation: positive ETCO2 and breath sounds checked- equal and bilateral Tube secured with: Tape Dental Injury: Teeth and Oropharynx as per pre-operative assessment

## 2023-01-25 NOTE — Anesthesia Preprocedure Evaluation (Signed)
 Anesthesia Evaluation  Patient identified by MRN, date of birth, ID band Patient awake    Reviewed: Allergy & Precautions, NPO status , Patient's Chart, lab work & pertinent test results  Airway Mallampati: II  TM Distance: >3 FB Neck ROM: Full    Dental  (+) Dental Advisory Given   Pulmonary sleep apnea and Continuous Positive Airway Pressure Ventilation , former smoker   breath sounds clear to auscultation       Cardiovascular hypertension, Pt. on medications and Pt. on home beta blockers + CAD, + Cardiac Stents and + CABG   Rhythm:Regular Rate:Normal     Neuro/Psych negative neurological ROS     GI/Hepatic Neg liver ROS,GERD  ,,  Endo/Other  Hypothyroidism    Renal/GU negative Renal ROS     Musculoskeletal  (+) Arthritis ,    Abdominal   Peds  Hematology   Anesthesia Other Findings   Reproductive/Obstetrics                             Anesthesia Physical Anesthesia Plan  ASA: 3  Anesthesia Plan: General   Post-op Pain Management: Tylenol  PO (pre-op)*   Induction: Intravenous  PONV Risk Score and Plan: 2 and Dexamethasone  and Ondansetron   Airway Management Planned: LMA  Additional Equipment:   Intra-op Plan:   Post-operative Plan: Extubation in OR  Informed Consent: I have reviewed the patients History and Physical, chart, labs and discussed the procedure including the risks, benefits and alternatives for the proposed anesthesia with the patient or authorized representative who has indicated his/her understanding and acceptance.       Plan Discussed with: CRNA  Anesthesia Plan Comments:         Anesthesia Quick Evaluation

## 2023-01-25 NOTE — Transfer of Care (Signed)
 Immediate Anesthesia Transfer of Care Note  Patient: Erice Ahles [facility]  Procedure(s) Performed: REMOVAL GANGLION OF RIGHT WRIST (Right: Wrist)  Patient Location: PACU  Anesthesia Type:General  Level of Consciousness: drowsy  Airway & Oxygen Therapy: Patient Spontanous Breathing and Patient connected to face mask oxygen  Post-op Assessment: Report given to RN and Post -op Vital signs reviewed and stable  Post vital signs: Reviewed and stable  Last Vitals:  Vitals Value Taken Time  BP 150/67 01/25/23 1408  Temp 36.7 C 01/25/23 1408  Pulse 59 01/25/23 1410  Resp 11 01/25/23 1410  SpO2 99 % 01/25/23 1410  Vitals shown include unfiled device data.  Last Pain:  Vitals:   01/25/23 1137  TempSrc: Temporal  PainSc: 0-No pain         Complications: No notable events documented.

## 2023-01-25 NOTE — H&P (Signed)
 [name] is an 78 y.o. male.   Chief Complaint: ganglion HPI: 78 yo male with right wrist dorsal ganglion cyst.  It is bothersome to him.  He wishes to have it removed.  Allergies:  Allergies  Allergen Reactions   Codeine Nausea Only   Pollen Extract Other (See Comments)    Seasonal    Past Medical History:  Diagnosis Date   Anginal pain (HCC)    Arthritis    all over   Bladder cancer (HCC)    chemo/radiation   Bladder tumor    Chronic lower back pain    Coronary artery disease    a. s/p CABG x 2 (LIMA->LAD, RIMA->RCA);  b. s/p multiple PCI's to Ramus;  c. 08/2011 Cath/PCI: LM 70% into ramus with 70-80% there->treated wtih 3.5x18 Xience Xpedition DES, LCX  nonobs, RCA occluded.  RIMA & LIMA patent, EF 55-65%   DJD (degenerative joint disease)    GERD (gastroesophageal reflux disease)    History of gout    Hyperlipidemia    Hypertension    Hypothyroidism    Neuropathy    OSA on CPAP    Prostate cancer [facility])     Past Surgical History:  Procedure Laterality Date   ABDOMINAL HERNIA REPAIR     BIOPSY  11/26/2015   Procedure: BIOPSY;  Surgeon: Claudis RAYMOND Rivet, MD;  [facility];  Service: Endoscopy;;  gastric esophagus   BIOPSY  01/20/2021   Procedure: BIOPSY;  Surgeon: Eartha Angelia Sieving, MD;  [facility];  Service: Gastroenterology;;   BIOPSY  10/28/2021   Procedure: BIOPSY;  Surgeon: Eartha Angelia Sieving, MD;  [facility];  Service: Gastroenterology;;   BLADDER TUMOR EXCISION     BROW LIFT Bilateral 04/27/2019   Procedure: BILATERAL BLEPHAROPLASTY;  Surgeon: Harrie Agent, MD;  Location: [facility];  Service: Ophthalmology;  Laterality: Bilateral;   CARDIAC CATHETERIZATION N/A 04/10/2015   Procedure: Left Heart Cath and Cors/Grafts Angiography;  Surgeon: Ozell Fell, MD;  Location: [facility] INVASIVE CV LAB;  Service: Cardiovascular;  Laterality: N/A;   CARDIAC CATHETERIZATION N/A 04/10/2015   Procedure: Coronary Stent  Intervention;  Surgeon: Ozell Fell, MD;  Location: [facility] INVASIVE CV LAB;  Service: Cardiovascular;  Laterality: N/A;   CATARACT EXTRACTION W/PHACO Right 06/23/2015   Procedure: CATARACT EXTRACTION PHACO AND INTRAOCULAR LENS PLACEMENT RIGHT EYE; CDE:  7.08;  Surgeon: Cherene Mania, MD;  Location: [facility];  Service: Ophthalmology;  Laterality: Right;   CATARACT EXTRACTION W/PHACO Left 08/25/2015   Procedure: CATARACT EXTRACTION PHACO AND INTRAOCULAR LENS PLACEMENT LEFT EYE; CDE:  8.18;  Surgeon: Cherene Mania, MD;  Location: [facility];  Service: Ophthalmology;  Laterality: Left;   COLONOSCOPY N/A 12/28/2012   Procedure: COLONOSCOPY;  Surgeon: Claudis RAYMOND Rivet, MD;  [facility];  Service: Endoscopy;  Laterality: N/A;  830 rescheduled   COLONOSCOPY WITH PROPOFOL  N/A 01/20/2021   Procedure: COLONOSCOPY WITH PROPOFOL ;  Surgeon: Eartha Angelia Sieving, MD;  [facility];  Service: Gastroenterology;  Laterality: N/A;  11:05   CORONARY ANGIOPLASTY WITH STENT PLACEMENT  2013; 04/10/2015   this makes me a total of 7 (04/10/2015)   CORONARY ARTERY BYPASS GRAFT  1997   with (LIMA)   ESOPHAGEAL DILATION N/A 11/26/2015   Procedure: ESOPHAGEAL DILATION;  Surgeon: Claudis RAYMOND Rivet, MD;  [facility];  Service: Endoscopy;  Laterality: N/A;   ESOPHAGEAL DILATION N/A 05/23/2017   Procedure: ESOPHAGEAL DILATION;  Surgeon: Rivet Claudis RAYMOND, MD;  [facility];  Service: Endoscopy;  Laterality: N/A;   ESOPHAGEAL DILATION N/A 06/16/2017   Procedure: ESOPHAGEAL DILATION;  Surgeon: Golda Claudis PENNER, MD;  [facility];  Service: Endoscopy;  Laterality: N/A;   ESOPHAGEAL DILATION  10/28/2021   Procedure: ESOPHAGEAL DILATION;  Surgeon: Eartha Angelia Sieving, MD;  [facility];  Service: Gastroenterology;;   ESOPHAGOGASTRODUODENOSCOPY N/A 11/26/2015   Procedure: ESOPHAGOGASTRODUODENOSCOPY (EGD);  Surgeon: Claudis PENNER Golda, MD;  [facility];  Service:  Endoscopy;  Laterality: N/A;  1:25   ESOPHAGOGASTRODUODENOSCOPY N/A 05/23/2017   Procedure: ESOPHAGOGASTRODUODENOSCOPY (EGD);  Surgeon: Golda Claudis PENNER, MD;  [facility];  Service: Endoscopy;  Laterality: N/A;  1:15   ESOPHAGOGASTRODUODENOSCOPY N/A 06/16/2017   Procedure: ESOPHAGOGASTRODUODENOSCOPY (EGD);  Surgeon: Golda Claudis PENNER, MD;  [facility];  Service: Endoscopy;  Laterality: N/A;  1240   ESOPHAGOGASTRODUODENOSCOPY (EGD) WITH PROPOFOL  N/A 01/20/2021   Procedure: ESOPHAGOGASTRODUODENOSCOPY (EGD) WITH PROPOFOL ;  Surgeon: Eartha Angelia Sieving, MD;  [facility];  Service: Gastroenterology;  Laterality: N/A;   ESOPHAGOGASTRODUODENOSCOPY (EGD) WITH PROPOFOL  N/A 10/28/2021   Procedure: ESOPHAGOGASTRODUODENOSCOPY (EGD) WITH PROPOFOL ;  Surgeon: Eartha Angelia Sieving, MD;  [facility];  Service: Gastroenterology;  Laterality: N/A;  215 ASA 2, pt will arrive at 10:45   HERNIA REPAIR Left    IR IMAGING GUIDED PORT INSERTION  02/24/2022   KNEE CARTILAGE SURGERY Bilateral    LEFT HEART CATH AND CORS/GRAFTS ANGIOGRAPHY N/A 09/08/2016   Procedure: LEFT HEART CATH AND CORS/GRAFTS ANGIOGRAPHY;  Surgeon: Jordan, Peter M, MD;  Location: [facility] INVASIVE CV LAB;  Service: Cardiovascular;  Laterality: N/A;   LEFT HEART CATHETERIZATION WITH CORONARY ANGIOGRAM N/A 09/08/2011   Procedure: LEFT HEART CATHETERIZATION WITH CORONARY ANGIOGRAM;  Surgeon: Ozell Fell, MD;  Location: [facility] CATH LAB;  Service: Cardiovascular;  Laterality: N/A;   PERCUTANEOUS CORONARY STENT INTERVENTION (PCI-S) Right 09/08/2011   Procedure: PERCUTANEOUS CORONARY STENT INTERVENTION (PCI-S);  Surgeon: Ozell Fell, MD;  Location: [facility] CATH LAB;  Service: Cardiovascular;  Laterality: Right;   POLYPECTOMY  01/20/2021   Procedure: POLYPECTOMY;  Surgeon: Eartha Angelia Sieving, MD;  [facility];  Service: Gastroenterology;;   ROBOT ASSISTED LAPAROSCOPIC RADICAL PROSTATECTOMY       Family History: Family History  Problem Relation Age of Onset   Cancer Father 20       died   Stroke Mother 39       died    Social History:   reports that he has quit smoking. His smoking use included cigars. He has been exposed to tobacco smoke. He has never used smokeless tobacco. He reports current alcohol use. He reports that he does not use drugs.  Medications: Medications Prior to Admission  Medication Sig Dispense Refill   amLODipine  (NORVASC ) 10 MG tablet Take 10 mg by mouth in the morning.     aspirin  EC 81 MG tablet Take 81 mg by mouth every evening. 30 tablet    atorvastatin  (LIPITOR) 40 MG tablet TAKE 1/2 TABLET(20 MG) BY MOUTH EVERY EVENING 45 tablet 3   carvedilol  (COREG ) 6.25 MG tablet TAKE 1 TABLET(6.25 MG) BY MOUTH TWICE DAILY 180 tablet 1   cetirizine (ZYRTEC) 10 MG tablet Take 10 mg by mouth every evening.     clopidogrel  (PLAVIX ) 75 MG tablet Take 1 tablet (75 mg total) by mouth in the morning. 90 tablet 3   Coenzyme Q10 300 MG CAPS Take 300 mg by mouth every evening.     cyanocobalamin  (,  VITAMIN B-12,) 1000 MCG/ML injection Inject 1,000 mcg into the muscle See admin instructions. Inject (1000 mcg) intramuscularly once monthly as needed for B-12 deficiency.  2   gabapentin  (NEURONTIN ) 600 MG tablet Take 600 mg by mouth 3 (three) times daily.  3   GLUCOSAMINE-CHONDROITIN PO Take 1 tablet by mouth every evening.     isosorbide  mononitrate (IMDUR ) 60 MG 24 hr tablet TAKE 1 AND 1/2 TABLETS(90 MG) BY MOUTH DAILY 45 tablet 7   levothyroxine  (SYNTHROID ) 175 MCG tablet Take 175 mcg by mouth daily.     meclizine  (ANTIVERT ) 25 MG tablet Take 1 tablet (25 mg total) by mouth 2 (two) times daily as needed for dizziness. (Patient taking differently: Take 12.5 mg by mouth at bedtime as needed for dizziness.) 30 tablet 0   Polyethyl Glycol-Propyl Glycol (LUBRICANT EYE DROPS) 0.4-0.3 % SOLN Place 1-2 drops into both eyes 3 (three) times daily as needed (dry/irritated eyes.).      solifenacin  (VESICARE ) 5 MG tablet Take 1 tablet (5 mg total) by mouth daily. 30 tablet 11   traMADol  (ULTRAM ) 50 MG tablet Take 50 mg by mouth 3 (three) times daily.  1   nitroGLYCERIN  (NITROSTAT ) 0.4 MG SL tablet Place 1 tablet (0.4 mg total) under the tongue every 5 (five) minutes as needed for chest pain. 25 tablet 11    No results found for this or any previous visit (from the past 48 hours).  No results found.    Blood pressure 124/66, pulse 69, temperature 98.4 F (36.9 C), temperature source Temporal, resp. rate 16, height 6' 1 (1.854 m), weight 91.3 kg, SpO2 97%.  General appearance: alert, cooperative, and appears stated age Head: Normocephalic, without obvious abnormality, atraumatic Neck: supple, symmetrical, trachea midline Extremities: Intact sensation and capillary refill all digits.  +epl/fpl/io.  No wounds.  Skin: Skin color, texture, turgor normal. No rashes or lesions Neurologic: Grossly normal Incision/Wound: none  Assessment/Plan Right wrist dorsal ganglion cyst.  Non operative and operative treatment options have been discussed with the patient and patient wishes to proceed with operative treatment. Risks, benefits, and alternatives of surgery have been discussed and the patient agrees with the plan of care.   [name] 01/25/2023, 1:11 PM

## 2023-01-25 NOTE — Discharge Instructions (Addendum)

## 2023-01-26 ENCOUNTER — Encounter (HOSPITAL_BASED_OUTPATIENT_CLINIC_OR_DEPARTMENT_OTHER): Payer: Self-pay | Admitting: Orthopedic Surgery

## 2023-01-26 LAB — SURGICAL PATHOLOGY

## 2023-01-26 NOTE — Anesthesia Postprocedure Evaluation (Signed)
 Anesthesia Post Note  Patient: [name] [facility]  Procedure(s) Performed: REMOVAL GANGLION OF RIGHT WRIST (Right: Wrist)     Patient location during evaluation: PACU Anesthesia Type: General Level of consciousness: awake and alert Pain management: pain level controlled Vital Signs Assessment: post-procedure vital signs reviewed and stable Respiratory status: spontaneous breathing, nonlabored ventilation, respiratory function stable and patient connected to nasal cannula oxygen Cardiovascular status: blood pressure returned to baseline and stable Postop Assessment: no apparent nausea or vomiting Anesthetic complications: no   No notable events documented.  Last Vitals:  Vitals:   01/25/23 1430 01/25/23 1447  BP: 122/68 (!) 155/73  Pulse: 64 60  Resp: 16 16  Temp:  (!) 36.2 C  SpO2: 95% 97%    Last Pain:  Vitals:   01/25/23 1447  TempSrc:   PainSc: 0-No pain                 Epifanio Lamar BRAVO

## 2023-02-02 DIAGNOSIS — E039 Hypothyroidism, unspecified: Secondary | ICD-10-CM | POA: Diagnosis not present

## 2023-02-06 DIAGNOSIS — G4730 Sleep apnea, unspecified: Secondary | ICD-10-CM | POA: Diagnosis not present

## 2023-02-10 ENCOUNTER — Other Ambulatory Visit: Payer: Self-pay

## 2023-02-10 DIAGNOSIS — C678 Malignant neoplasm of overlapping sites of bladder: Secondary | ICD-10-CM

## 2023-02-11 ENCOUNTER — Ambulatory Visit (HOSPITAL_COMMUNITY)
Admission: RE | Admit: 2023-02-11 | Discharge: 2023-02-11 | Disposition: A | Discharge status: Home / Self Care | Payer: Medicare Other | Source: Ambulatory Visit | Attending: Hematology | Admitting: Hematology

## 2023-02-11 ENCOUNTER — Inpatient Hospital Stay: Payer: Medicare Other | Attending: Hematology

## 2023-02-11 DIAGNOSIS — D539 Nutritional anemia, unspecified: Secondary | ICD-10-CM | POA: Insufficient documentation

## 2023-02-11 DIAGNOSIS — C678 Malignant neoplasm of overlapping sites of bladder: Secondary | ICD-10-CM

## 2023-02-11 DIAGNOSIS — K5730 Diverticulosis of large intestine without perforation or abscess without bleeding: Secondary | ICD-10-CM | POA: Diagnosis not present

## 2023-02-11 DIAGNOSIS — C672 Malignant neoplasm of lateral wall of bladder: Secondary | ICD-10-CM | POA: Insufficient documentation

## 2023-02-11 DIAGNOSIS — C679 Malignant neoplasm of bladder, unspecified: Secondary | ICD-10-CM | POA: Diagnosis not present

## 2023-02-11 DIAGNOSIS — N3289 Other specified disorders of bladder: Secondary | ICD-10-CM | POA: Diagnosis not present

## 2023-02-11 LAB — CBC WITH DIFFERENTIAL/PLATELET
Abs Immature Granulocytes: 0.01 10*3/uL (ref 0.00–0.07)
Basophils Absolute: 0 10*3/uL (ref 0.0–0.1)
Basophils Relative: 1 %
Eosinophils Absolute: 0.1 10*3/uL (ref 0.0–0.5)
Eosinophils Relative: 2 %
HCT: 30.5 % — ABNORMAL LOW (ref 39.0–52.0)
Hemoglobin: 9.8 g/dL — ABNORMAL LOW (ref 13.0–17.0)
Immature Granulocytes: 0 %
Lymphocytes Relative: 27 %
Lymphs Abs: 1.1 10*3/uL (ref 0.7–4.0)
MCH: 33.6 pg (ref 26.0–34.0)
MCHC: 32.1 g/dL (ref 30.0–36.0)
MCV: 104.5 fL — ABNORMAL HIGH (ref 80.0–100.0)
Monocytes Absolute: 0.5 10*3/uL (ref 0.1–1.0)
Monocytes Relative: 11 %
Neutro Abs: 2.3 10*3/uL (ref 1.7–7.7)
Neutrophils Relative %: 59 %
Platelets: 220 10*3/uL (ref 150–400)
RBC: 2.92 MIL/uL — ABNORMAL LOW (ref 4.22–5.81)
RDW: 13.5 % (ref 11.5–15.5)
WBC: 4 10*3/uL (ref 4.0–10.5)
nRBC: 0 % (ref 0.0–0.2)

## 2023-02-11 LAB — COMPREHENSIVE METABOLIC PANEL
ALT: 22 U/L (ref 0–44)
AST: 30 U/L (ref 15–41)
Albumin: 3.9 g/dL (ref 3.5–5.0)
Alkaline Phosphatase: 39 U/L (ref 38–126)
Anion gap: 9 (ref 5–15)
BUN: 26 mg/dL — ABNORMAL HIGH (ref 8–23)
CO2: 27 mmol/L (ref 22–32)
Calcium: 9.2 mg/dL (ref 8.9–10.3)
Chloride: 102 mmol/L (ref 98–111)
Creatinine, Ser: 1.46 mg/dL — ABNORMAL HIGH (ref 0.61–1.24)
GFR, Estimated: 48 mL/min — ABNORMAL LOW (ref >60)
Glucose, Bld: 115 mg/dL — ABNORMAL HIGH (ref 70–99)
Potassium: 4.3 mmol/L (ref 3.5–5.1)
Sodium: 138 mmol/L (ref 135–145)
Total Bilirubin: 0.5 mg/dL (ref 0.0–1.2)
Total Protein: 7.1 g/dL (ref 6.5–8.1)

## 2023-02-11 LAB — MAGNESIUM: Magnesium: 2.2 mg/dL (ref 1.7–2.4)

## 2023-02-11 MED ORDER — IOHEXOL 300 MG/ML  SOLN
100.0000 mL | Freq: Once | INTRAMUSCULAR | Status: AC | PRN
  Administered 2023-02-11: 100 mL via INTRAVENOUS

## 2023-02-14 NOTE — Telephone Encounter (Signed)
 FYI

## 2023-02-16 DIAGNOSIS — G8929 Other chronic pain: Secondary | ICD-10-CM | POA: Diagnosis not present

## 2023-02-16 DIAGNOSIS — Z0001 Encounter for general adult medical examination with abnormal findings: Secondary | ICD-10-CM | POA: Diagnosis not present

## 2023-02-17 ENCOUNTER — Inpatient Hospital Stay: Payer: Medicare Other | Attending: Hematology | Admitting: Hematology

## 2023-02-17 ENCOUNTER — Inpatient Hospital Stay: Payer: Medicare Other

## 2023-02-17 VITALS — BP 119/65 | HR 72 | Temp 96.6°F | Resp 18 | Wt 206.6 lb

## 2023-02-17 DIAGNOSIS — D508 Other iron deficiency anemias: Secondary | ICD-10-CM

## 2023-02-17 DIAGNOSIS — N189 Chronic kidney disease, unspecified: Secondary | ICD-10-CM | POA: Insufficient documentation

## 2023-02-17 DIAGNOSIS — E785 Hyperlipidemia, unspecified: Secondary | ICD-10-CM | POA: Diagnosis not present

## 2023-02-17 DIAGNOSIS — E611 Iron deficiency: Secondary | ICD-10-CM | POA: Diagnosis not present

## 2023-02-17 DIAGNOSIS — C678 Malignant neoplasm of overlapping sites of bladder: Secondary | ICD-10-CM

## 2023-02-17 DIAGNOSIS — Z8042 Family history of malignant neoplasm of prostate: Secondary | ICD-10-CM | POA: Diagnosis not present

## 2023-02-17 DIAGNOSIS — K5730 Diverticulosis of large intestine without perforation or abscess without bleeding: Secondary | ICD-10-CM | POA: Insufficient documentation

## 2023-02-17 DIAGNOSIS — G629 Polyneuropathy, unspecified: Secondary | ICD-10-CM | POA: Insufficient documentation

## 2023-02-17 DIAGNOSIS — D631 Anemia in chronic kidney disease: Secondary | ICD-10-CM | POA: Insufficient documentation

## 2023-02-17 DIAGNOSIS — Z87891 Personal history of nicotine dependence: Secondary | ICD-10-CM | POA: Insufficient documentation

## 2023-02-17 DIAGNOSIS — I129 Hypertensive chronic kidney disease with stage 1 through stage 4 chronic kidney disease, or unspecified chronic kidney disease: Secondary | ICD-10-CM | POA: Diagnosis not present

## 2023-02-17 DIAGNOSIS — Z809 Family history of malignant neoplasm, unspecified: Secondary | ICD-10-CM | POA: Diagnosis not present

## 2023-02-17 DIAGNOSIS — E039 Hypothyroidism, unspecified: Secondary | ICD-10-CM | POA: Insufficient documentation

## 2023-02-17 DIAGNOSIS — C672 Malignant neoplasm of lateral wall of bladder: Secondary | ICD-10-CM | POA: Diagnosis not present

## 2023-02-17 LAB — IRON AND TIBC
Iron: 62 ug/dL (ref 45–182)
Saturation Ratios: 17 % — ABNORMAL LOW (ref 17.9–39.5)
TIBC: 363 ug/dL (ref 250–450)
UIBC: 301 ug/dL

## 2023-02-17 LAB — FOLATE: Folate: 28.4 ng/mL (ref >5.9)

## 2023-02-17 LAB — FERRITIN: Ferritin: 137 ng/mL (ref 24–336)

## 2023-02-17 LAB — TSH: TSH: 2.888 u[IU]/mL (ref 0.350–4.500)

## 2023-02-17 LAB — VITAMIN B12: Vitamin B-12: 189 pg/mL (ref 180–914)

## 2023-02-17 NOTE — Progress Notes (Signed)
 [facility] [address], [place] [address]    Clinic Day:  02/17/23   Referring physician: Marvine Rush, MD  Patient Care Team: Steve Rush, MD as PCP - General (Family Medicine) Steve Sharper, MD as PCP - Cardiology (Cardiology) Steve Hai, MD as Medical Oncologist (Medical Oncology) Steve Joesph SQUIBB, RN as Oncology Nurse Navigator (Medical Oncology)   ASSESSMENT & PLAN:   Assessment: 1.  T2 N0 bladder cancer (multifocal): - 12/16/2014: Initial diagnosis, malignant neoplasm of lateral wall of bladder - 03/14/2018: TURBT (pathology-invasive papillary urothelial carcinoma, high-grade, tumor involves lamina propria, muscularis propria present, not involved by tumor) - 05/04/2018 - 06/13/2018: Initial BCG induction course - 08/22/2018: Bladder biopsy HG Ta, multifocal - 08/29/2018 - 10/01/2018: Intravesicular valrubicin  - 02/20/2019: Bladder biopsy-benign - 09/15/2019: TURBT- HG Ta, muscle present - 10/24/2019 - 11/28/2019: Completion of BCG induction course x 6 - 05/14/2020: TURBT-HG Ta, muscle present - 07/02/2020 - 08/20/2020: Intravesical gemcitabine /docetaxel  - 03/10/2021: TURBT-HG Ta + CIS, muscle present - 04/29/2021 - 06/03/2021: Reinduction intravesical gemcitabine /docetaxel  - 07/06/2021: Cystoscopy without any visible tumors, cytology with high-grade urothelial carcinoma - 12/30/2021: Progression with lesions throughout the bladder, muscle invasive at left lateral wall - He has chronic neuropathy, CKD which precludes him from cisplatin-based neoadjuvant chemotherapy followed by cystectomy. - He met with radiation oncology at [facility].  Due to multifocal nature of bladder cancer, they recommended him to go for surgery.  He preferred trimodality therapy. - Cystoscopy and TURBT (03/24/2022): At [facility] [address] with specimens taken from right posterior wall bladder tumor, left anterior dome bladder tumor and left lateral wall scar.  Pathology shows urothelial carcinoma  in situ with no evidence of invasion in all 3 specimens.  Muscularis propria present, uninvolved by malignancy. - XRT started on 04/12/2022, cycle 1 of 5-FU and mitomycin  on 04/14/2022.  XRT completed on 05/31/2022.   2.  Social/family history: - He lives at home by himself.  He is seen today with his daughter.  He is independent of ADLs and IADLs.  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  He dances 1-2 times per week.  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  Quit smoking 50 years ago. - Father had prostate cancer.  Maternal grandmother had cancer, type unknown to the patient.    Plan: 1.  T2 N0 multifocal bladder cancer: - He has occasional blood in the urine. - He had cystoscopy by Dr. Sherrilee in December which did not show any tumor. - Reviewed CTAP from 02/11/2023: Stable diffuse bladder wall thickening with no evidence of metastatic disease. - RTC 6 months for follow-up with repeat CTAP with contrast and labs.   2.  Normocytic to macrocytic anemia: - Anemia from CKD and functional iron deficiency.  Last Feraheme  on 09/13/2022. - Hemoglobin today is 9.8.  We checked iron panel which showed ferritin 137% saturation 17.  B12 is 189.  He complained of severe fatigue.  TSH is normal. - Will give him Feraheme  weekly x 2.  Will also give him B12 1 mg IM x 1 and start on B12 tablet.    No orders of the defined types were placed in this encounter.     [name],acting as a neurosurgeon for Mack Rogers, MD.,have documented all relevant documentation on the behalf of Mack Rogers, MD,as directed by  Mack Rogers, MD while in the presence of Mack Rogers, MD.  I, Mack Rogers MD, have reviewed the above documentation for accuracy and  completeness, and I agree with the above.      [place] [name]   2/6/20259:25 AM  CHIEF COMPLAINT:   Diagnosis: T2 N0 bladder cancer    Cancer Staging  Bladder cancer [facility]) Staging form:  Urinary Bladder, AJCC 8th Edition - Clinical stage from 01/29/2022: Stage II (cT2, cN0, cM0) - Unsigned    Prior Therapy: Trimodality therapy  Current Therapy: Surveillance   HISTORY OF PRESENT ILLNESS:   Oncology History  Bladder cancer (HCC)  01/29/2022 Initial Diagnosis   Bladder cancer (HCC)   04/14/2022 -  Chemotherapy   Patient is on Treatment Plan : BLADDER Mitomycin  D1 + 5FU D1-5, 21-25 + XRT        INTERVAL HISTORY:   [name] is a 78 y.o. male presenting to clinic today for follow up of T2 N0 bladder cancer. He was last seen by me on 11/11/22.  Since his last visit, he underwent CT A/P on 02/11/23 that found: Stable marked diffuse bladder wall thickening with soft tissue stranding in perivesical fat. No new or progressive disease within the pelvis or abdomen. Colonic diverticulosis, without radiographic evidence of diverticulitis.  [name] had a ganglion cyst removed from the right wrist on 01/25/23 under Dr. Murrell. He underwent cystoscopy on 12/20/22 with Dr. Sherrilee.  Today, he states that he is doing well overall. His appetite level is at 100%. His energy level is at 50%. He reports generalized weakness and tiredness. He also notes food doesn't taste right. He has occasional spotting of blood in the urine.   He states he stopped taking a medicine given by Dr. Sherrilee yesterday, used for bladder control, as it caused throat swelling, lip numbness and stopped improving urinary incontinence.   PAST MEDICAL HISTORY:   Past Medical History: Past Medical History:  Diagnosis Date   Anginal pain (HCC)    Arthritis    all over   Bladder cancer (HCC)    chemo/radiation   Bladder tumor    Chronic lower back pain    Coronary artery disease    a. s/p CABG x 2 (LIMA->LAD, RIMA->RCA);  b. s/p multiple PCI's to Ramus;  c. 08/2011 Cath/PCI: LM 70% into ramus with 70-80% there->treated wtih 3.5x18 Xience Xpedition DES, LCX  nonobs, RCA occluded.  RIMA & LIMA patent, EF 55-65%    DJD (degenerative joint disease)    GERD (gastroesophageal reflux disease)    History of gout    Hyperlipidemia    Hypertension    Hypothyroidism    Neuropathy    OSA on CPAP    Prostate cancer [facility])     Surgical History: Past Surgical History:  Procedure Laterality Date   ABDOMINAL HERNIA REPAIR     BIOPSY  11/26/2015   Procedure: BIOPSY;  Surgeon: Claudis RAYMOND Rivet, MD;  [facility];  Service: Endoscopy;;  gastric esophagus   BIOPSY  01/20/2021   Procedure: BIOPSY;  Surgeon: Eartha Angelia Sieving, MD;  [facility];  Service: Gastroenterology;;   BIOPSY  10/28/2021   Procedure: BIOPSY;  Surgeon: Eartha Angelia Sieving, MD;  [facility];  Service: Gastroenterology;;   BLADDER TUMOR EXCISION     BROW LIFT Bilateral 04/27/2019   Procedure: BILATERAL BLEPHAROPLASTY;  Surgeon: Harrie Agent, MD;  Location: [facility];  Service: Ophthalmology;  Laterality: Bilateral;   CARDIAC CATHETERIZATION N/A 04/10/2015   Procedure: Left Heart Cath and Cors/Grafts Angiography;  Surgeon: Ozell Fell, MD;  Location: [facility] INVASIVE CV LAB;  Service: Cardiovascular;  Laterality: N/A;  CARDIAC CATHETERIZATION N/A 04/10/2015   Procedure: Coronary Stent Intervention;  Surgeon: Ozell Fell, MD;  Location: [facility] INVASIVE CV LAB;  Service: Cardiovascular;  Laterality: N/A;   CATARACT EXTRACTION W/PHACO Right 06/23/2015   Procedure: CATARACT EXTRACTION PHACO AND INTRAOCULAR LENS PLACEMENT RIGHT EYE; CDE:  7.08;  Surgeon: Cherene Mania, MD;  Location: [facility];  Service: Ophthalmology;  Laterality: Right;   CATARACT EXTRACTION W/PHACO Left 08/25/2015   Procedure: CATARACT EXTRACTION PHACO AND INTRAOCULAR LENS PLACEMENT LEFT EYE; CDE:  8.18;  Surgeon: Cherene Mania, MD;  Location: [facility];  Service: Ophthalmology;  Laterality: Left;   COLONOSCOPY N/A 12/28/2012   Procedure: COLONOSCOPY;  Surgeon: Claudis RAYMOND Rivet, MD;  [facility];  Service: Endoscopy;  Laterality: N/A;  830  rescheduled   COLONOSCOPY WITH PROPOFOL  N/A 01/20/2021   Procedure: COLONOSCOPY WITH PROPOFOL ;  Surgeon: Eartha Angelia Sieving, MD;  [facility];  Service: Gastroenterology;  Laterality: N/A;  11:05   CORONARY ANGIOPLASTY WITH STENT PLACEMENT  2013; 04/10/2015   this makes me a total of 7 (04/10/2015)   CORONARY ARTERY BYPASS GRAFT  1997   with (LIMA)   ESOPHAGEAL DILATION N/A 11/26/2015   Procedure: ESOPHAGEAL DILATION;  Surgeon: Claudis RAYMOND Rivet, MD;  [facility];  Service: Endoscopy;  Laterality: N/A;   ESOPHAGEAL DILATION N/A 05/23/2017   Procedure: ESOPHAGEAL DILATION;  Surgeon: Rivet Claudis RAYMOND, MD;  [facility];  Service: Endoscopy;  Laterality: N/A;   ESOPHAGEAL DILATION N/A 06/16/2017   Procedure: ESOPHAGEAL DILATION;  Surgeon: Rivet Claudis RAYMOND, MD;  [facility];  Service: Endoscopy;  Laterality: N/A;   ESOPHAGEAL DILATION  10/28/2021   Procedure: ESOPHAGEAL DILATION;  Surgeon: Eartha Angelia Sieving, MD;  [facility];  Service: Gastroenterology;;   ESOPHAGOGASTRODUODENOSCOPY N/A 11/26/2015   Procedure: ESOPHAGOGASTRODUODENOSCOPY (EGD);  Surgeon: Claudis RAYMOND Rivet, MD;  [facility];  Service: Endoscopy;  Laterality: N/A;  1:25   ESOPHAGOGASTRODUODENOSCOPY N/A 05/23/2017   Procedure: ESOPHAGOGASTRODUODENOSCOPY (EGD);  Surgeon: Rivet Claudis RAYMOND, MD;  [facility];  Service: Endoscopy;  Laterality: N/A;  1:15   ESOPHAGOGASTRODUODENOSCOPY N/A 06/16/2017   Procedure: ESOPHAGOGASTRODUODENOSCOPY (EGD);  Surgeon: Rivet Claudis RAYMOND, MD;  [facility];  Service: Endoscopy;  Laterality: N/A;  1240   ESOPHAGOGASTRODUODENOSCOPY (EGD) WITH PROPOFOL  N/A 01/20/2021   Procedure: ESOPHAGOGASTRODUODENOSCOPY (EGD) WITH PROPOFOL ;  Surgeon: Eartha Angelia Sieving, MD;  [facility];  Service: Gastroenterology;  Laterality: N/A;   ESOPHAGOGASTRODUODENOSCOPY (EGD) WITH PROPOFOL  N/A 10/28/2021   Procedure:  ESOPHAGOGASTRODUODENOSCOPY (EGD) WITH PROPOFOL ;  Surgeon: Eartha Angelia Sieving, MD;  [facility];  Service: Gastroenterology;  Laterality: N/A;  215 ASA 2, pt will arrive at 10:45   GANGLION CYST EXCISION Right 01/25/2023   Procedure: REMOVAL GANGLION OF RIGHT WRIST;  Surgeon: Murrell Drivers, MD;  Location: [facility];  Service: Orthopedics;  Laterality: Right;   HERNIA REPAIR Left    IR IMAGING GUIDED PORT INSERTION  02/24/2022   KNEE CARTILAGE SURGERY Bilateral    LEFT HEART CATH AND CORS/GRAFTS ANGIOGRAPHY N/A 09/08/2016   Procedure: LEFT HEART CATH AND CORS/GRAFTS ANGIOGRAPHY;  Surgeon: Jordan, Peter M, MD;  Location: [facility] INVASIVE CV LAB;  Service: Cardiovascular;  Laterality: N/A;   LEFT HEART CATHETERIZATION WITH CORONARY ANGIOGRAM N/A 09/08/2011   Procedure: LEFT HEART CATHETERIZATION WITH CORONARY ANGIOGRAM;  Surgeon: Ozell Fell, MD;  Location: [facility] CATH LAB;  Service: Cardiovascular;  Laterality: N/A;   PERCUTANEOUS CORONARY STENT INTERVENTION (PCI-S) Right 09/08/2011  Procedure: PERCUTANEOUS CORONARY STENT INTERVENTION (PCI-S);  Surgeon: Ozell Fell, MD;  Location: [facility] CATH LAB;  Service: Cardiovascular;  Laterality: Right;   POLYPECTOMY  01/20/2021   Procedure: POLYPECTOMY;  Surgeon: Eartha Angelia Sieving, MD;  [facility];  Service: Gastroenterology;;   ROBOT ASSISTED LAPAROSCOPIC RADICAL PROSTATECTOMY      Social History: Social History   Socioeconomic History   Marital status: Widowed    Spouse name: Not on file   Number of children: Not on file   Years of education: Not on file   Highest education level: Not on file  Occupational History   Occupation: semi-retired  Tobacco Use   Smoking status: Former    Types: Cigars    Passive exposure: Past   Smokeless tobacco: Never   Tobacco comments:    quit smoking in ~ 1978  Vaping Use   Vaping status: Never Used  Substance and Sexual Activity   Alcohol use: Yes    Comment:  occasionally    Drug use: No   Sexual activity: Not Currently  Other Topics Concern   Not on file  Social History Narrative   Not on file   Social Drivers of Health   Financial Resource Strain: Low Risk  (08/10/2022)   Overall Financial Resource Strain (CARDIA)    Difficulty of Paying Living Expenses: Not hard at all  Food Insecurity: Low Risk  (12/01/2022)   Received from [facility]   Hunger Vital Sign    Worried About Running Out of Food in the Last Year: Never true    Ran Out of Food in the Last Year: Never true  Transportation Needs: No Transportation Needs (12/01/2022)   Received from [facility]    In the past 12 months, has lack of reliable transportation kept you from medical appointments, meetings, work or from getting things needed for daily living? : No  Physical Activity: Sufficiently Active (06/04/2022)   Exercise Vital Sign    Days of Exercise per Week: 5 days    Minutes of Exercise per Session: 30 min  Stress: Not on file  Social Connections: Not on file  Intimate Partner Violence: Not At Risk (02/04/2022)   Humiliation, Afraid, Rape, and Kick questionnaire    Fear of Current or Ex-Partner: No    Emotionally Abused: No    Physically Abused: No    Sexually Abused: No    Family History: Family History  Problem Relation Age of Onset   Cancer Father 43       died   Stroke Mother 72       died    Current Medications:  Current Outpatient Medications:    amLODipine  (NORVASC ) 10 MG tablet, Take 10 mg by mouth in the morning., Disp: , Rfl:    aspirin  EC 81 MG tablet, Take 81 mg by mouth every evening., Disp: 30 tablet, Rfl:    atorvastatin  (LIPITOR) 40 MG tablet, TAKE 1/2 TABLET(20 MG) BY MOUTH EVERY EVENING, Disp: 45 tablet, Rfl: 3   carvedilol  (COREG ) 6.25 MG tablet, TAKE 1 TABLET(6.25 MG) BY MOUTH TWICE DAILY, Disp: 180 tablet, Rfl: 1   cetirizine (ZYRTEC) 10 MG tablet, Take 10 mg by mouth every evening., Disp: , Rfl:    clopidogrel   (PLAVIX ) 75 MG tablet, Take 1 tablet (75 mg total) by mouth in the morning., Disp: 90 tablet, Rfl: 3   Coenzyme Q10 300 MG CAPS, Take 300 mg by mouth every evening., Disp: , Rfl:    cyanocobalamin  (,VITAMIN  B-12,) 1000 MCG/ML injection, Inject 1,000 mcg into the muscle See admin instructions. Inject (1000 mcg) intramuscularly once monthly as needed for B-12 deficiency., Disp: , Rfl: 2   gabapentin  (NEURONTIN ) 600 MG tablet, Take 600 mg by mouth 3 (three) times daily., Disp: , Rfl: 3   GLUCOSAMINE-CHONDROITIN PO, Take 1 tablet by mouth every evening., Disp: , Rfl:    isosorbide  mononitrate (IMDUR ) 60 MG 24 hr tablet, TAKE 1 AND 1/2 TABLETS(90 MG) BY MOUTH DAILY, Disp: 45 tablet, Rfl: 7   levothyroxine  (SYNTHROID ) 175 MCG tablet, Take 175 mcg by mouth daily., Disp: , Rfl:    meclizine  (ANTIVERT ) 25 MG tablet, Take 1 tablet (25 mg total) by mouth 2 (two) times daily as needed for dizziness. (Patient taking differently: Take 12.5 mg by mouth at bedtime as needed for dizziness.), Disp: 30 tablet, Rfl: 0   nitroGLYCERIN  (NITROSTAT ) 0.4 MG SL tablet, Place 1 tablet (0.4 mg total) under the tongue every 5 (five) minutes as needed for chest pain., Disp: 25 tablet, Rfl: 11   Polyethyl Glycol-Propyl Glycol (LUBRICANT EYE DROPS) 0.4-0.3 % SOLN, Place 1-2 drops into both eyes 3 (three) times daily as needed (dry/irritated eyes.)., Disp: , Rfl:    solifenacin  (VESICARE ) 5 MG tablet, Take 1 tablet (5 mg total) by mouth daily., Disp: 30 tablet, Rfl: 11   traMADol  (ULTRAM ) 50 MG tablet, Take 50 mg by mouth 3 (three) times daily., Disp: , Rfl: 1   traMADol  (ULTRAM ) 50 MG tablet, Take 1 tablet (50 mg total) by mouth every 6 (six) hours as needed., Disp: 20 tablet, Rfl: 0   Allergies: Allergies  Allergen Reactions   Codeine Nausea Only   Pollen Extract Other (See Comments)    Seasonal    REVIEW OF SYSTEMS:   Review of Systems  Constitutional:  Negative for chills, fatigue and fever.  HENT:   Negative for  lump/mass, mouth sores, nosebleeds, sore throat and trouble swallowing.   Eyes:  Negative for eye problems.  Respiratory:  Negative for cough and shortness of breath.   Cardiovascular:  Negative for chest pain, leg swelling and palpitations.  Gastrointestinal:  Positive for constipation. Negative for abdominal pain, diarrhea, nausea and vomiting.  Genitourinary:  Positive for dysuria and hematuria. Negative for bladder incontinence, difficulty urinating, frequency and nocturia.   Musculoskeletal:  Negative for arthralgias, back pain, flank pain, myalgias and neck pain.  Skin:  Negative for itching and rash.  Neurological:  Positive for dizziness and numbness (in feet). Negative for headaches.  Hematological:  Does not bruise/bleed easily.  Psychiatric/Behavioral:  Positive for sleep disturbance. Negative for depression and suicidal ideas. The patient is not nervous/anxious.   All other systems reviewed and are negative.    VITALS:   There were no vitals taken for this visit.  Wt Readings from Last 3 Encounters:  01/25/23 201 lb 4.5 oz (91.3 kg)  12/20/22 209 lb (94.8 kg)  11/11/22 209 lb (94.8 kg)    There is no height or weight on file to calculate BMI.  Performance status (ECOG): 1 - Symptomatic but completely ambulatory  PHYSICAL EXAM:   Physical Exam Vitals and nursing note reviewed. Exam conducted with a chaperone present.  Constitutional:      Appearance: Normal appearance.  Cardiovascular:     Rate and Rhythm: Normal rate and regular rhythm.     Pulses: Normal pulses.     Heart sounds: Normal heart sounds.  Pulmonary:     Effort: Pulmonary effort is normal.  Breath sounds: Normal breath sounds.  Abdominal:     Palpations: Abdomen is soft. There is no hepatomegaly, splenomegaly or mass.     Tenderness: There is no abdominal tenderness.  Musculoskeletal:     Right lower leg: No edema.     Left lower leg: No edema.  Lymphadenopathy:     Cervical: No cervical  adenopathy.     Right cervical: No superficial, deep or posterior cervical adenopathy.    Left cervical: No superficial, deep or posterior cervical adenopathy.     Upper Body:     Right upper body: No supraclavicular or axillary adenopathy.     Left upper body: No supraclavicular or axillary adenopathy.  Neurological:     General: No focal deficit present.     Mental Status: He is alert and oriented to person, place, and time.  Psychiatric:        Mood and Affect: Mood normal.        Behavior: Behavior normal.     LABS:      Latest Ref Rng & Units 02/11/2023   11:56 AM 11/10/2022   10:58 AM 07/28/2022    2:20 PM  CBC  WBC 4.0 - 10.5 K/uL 4.0  4.8  4.1   Hemoglobin 13.0 - 17.0 g/dL 9.8  89.3  89.9   Hematocrit 39.0 - 52.0 % 30.5  33.8  31.1   Platelets 150 - 400 K/uL 220  213  163       Latest Ref Rng & Units 02/11/2023   11:56 AM 11/10/2022   10:58 AM 07/28/2022    2:20 PM  CMP  Glucose 70 - 99 mg/dL 884  895  867   BUN 8 - 23 mg/dL 26  21  31    Creatinine 0.61 - 1.24 mg/dL 8.53  8.59  8.32   Sodium 135 - 145 mmol/L 138  138  135   Potassium 3.5 - 5.1 mmol/L 4.3  4.2  4.0   Chloride 98 - 111 mmol/L 102  102  102   CO2 22 - 32 mmol/L 27  28  25    Calcium  8.9 - 10.3 mg/dL 9.2  9.3  8.8   Total Protein 6.5 - 8.1 g/dL 7.1  7.1  6.6   Total Bilirubin 0.0 - 1.2 mg/dL 0.5  0.5  0.5   Alkaline Phos 38 - 126 U/L 39  45  35   AST 15 - 41 U/L 30  21  22    ALT 0 - 44 U/L 22  20  20       No results found for: CEA1, CEA / No results found for: CEA1, CEA No results found for: PSA1 No results found for: CAN199 No results found for: CAN125  No results found for: STEPHANY CARLOTA BENSON MARKEL EARLA JOANNIE DOC, MSPIKE, SPEI Lab Results  Component Value Date   TIBC 373 11/10/2022   TIBC 326 06/02/2022   TIBC 398 02/04/2022   FERRITIN 210 11/10/2022   FERRITIN 363 (H) 06/02/2022   FERRITIN 93 02/04/2022   IRONPCTSAT 25 11/10/2022    IRONPCTSAT 38 06/02/2022   IRONPCTSAT 32 02/04/2022   No results found for: LDH   STUDIES:   No results found.

## 2023-02-17 NOTE — Patient Instructions (Signed)
 [facility] at [facility] Discharge Instructions   You were seen and examined today by Dr. Rogers.  He reviewed the results of your lab work which are normal/stable.   He reviewed the results of your CT scan. There is no evidence of cancer seen on this exam.   We will see you back in .   Return as scheduled.    Thank you for choosing [facility] at [facility] to provide your oncology and hematology care.  To afford each patient quality time with our provider, please arrive at least 15 minutes before your scheduled appointment time.   If you have a lab appointment with the Cancer Center please come in thru the Main Entrance and check in at the main information desk.  You need to re-schedule your appointment should you arrive 10 or more minutes late.  We strive to give you quality time with our providers, and arriving late affects you and other patients whose appointments are after yours.  Also, if you no show three or more times for appointments you may be dismissed from the clinic at the providers discretion.     Again, thank you for choosing [facility].  Our hope is that these requests will decrease the amount of time that you wait before being seen by our physicians.       _____________________________________________________________  Should you have questions after your visit to [facility], please contact our office at [phone] and follow the prompts.  Our office hours are 8:00 a.m. and 4:30 p.m. Monday - Friday.  Please note that voicemails left after 4:00 p.m. may not be returned until the following business day.  We are closed weekends and major holidays.  You do have access to a nurse 24-7, just call the main number to the clinic [phone] and do not press any options, hold on the line and a nurse will answer the phone.    For prescription refill requests, have your pharmacy contact our office and  allow 72 hours.    Due to Covid, you will need to wear a mask upon entering the hospital. If you do not have a mask, a mask will be given to you at the Main Entrance upon arrival. For doctor visits, patients may have 1 support person age 59 or older with them. For treatment visits, patients can not have anyone with them due to social distancing guidelines and our immunocompromised population.

## 2023-02-18 ENCOUNTER — Other Ambulatory Visit: Payer: Self-pay

## 2023-02-18 ENCOUNTER — Telehealth: Payer: Self-pay

## 2023-02-18 MED ORDER — CYANOCOBALAMIN 1000 MCG/ML IJ SOLN: 1000.0000 ug | INTRAMUSCULAR | 0 refills | Status: DC

## 2023-02-18 NOTE — Progress Notes (Signed)
 Patient called to report drug allergy to Gemtesa . He reports that it caused him to have swelling of the throat, lips and dizziness. He has already discontinued the medication and is not having the symptoms now just wants it documented in his chart.

## 2023-02-25 ENCOUNTER — Inpatient Hospital Stay: Payer: Medicare Other

## 2023-02-25 VITALS — BP 158/70 | HR 52 | Temp 96.4°F | Resp 18

## 2023-02-25 DIAGNOSIS — C672 Malignant neoplasm of lateral wall of bladder: Secondary | ICD-10-CM | POA: Diagnosis not present

## 2023-02-25 DIAGNOSIS — D631 Anemia in chronic kidney disease: Secondary | ICD-10-CM | POA: Diagnosis not present

## 2023-02-25 DIAGNOSIS — E039 Hypothyroidism, unspecified: Secondary | ICD-10-CM | POA: Diagnosis not present

## 2023-02-25 DIAGNOSIS — I129 Hypertensive chronic kidney disease with stage 1 through stage 4 chronic kidney disease, or unspecified chronic kidney disease: Secondary | ICD-10-CM | POA: Diagnosis not present

## 2023-02-25 DIAGNOSIS — Z87891 Personal history of nicotine dependence: Secondary | ICD-10-CM | POA: Diagnosis not present

## 2023-02-25 DIAGNOSIS — D509 Iron deficiency anemia, unspecified: Secondary | ICD-10-CM

## 2023-02-25 DIAGNOSIS — G629 Polyneuropathy, unspecified: Secondary | ICD-10-CM | POA: Diagnosis not present

## 2023-02-25 DIAGNOSIS — D500 Iron deficiency anemia secondary to blood loss (chronic): Secondary | ICD-10-CM

## 2023-02-25 DIAGNOSIS — N189 Chronic kidney disease, unspecified: Secondary | ICD-10-CM | POA: Diagnosis not present

## 2023-02-25 DIAGNOSIS — E785 Hyperlipidemia, unspecified: Secondary | ICD-10-CM | POA: Diagnosis not present

## 2023-02-25 DIAGNOSIS — E611 Iron deficiency: Secondary | ICD-10-CM | POA: Diagnosis not present

## 2023-02-25 DIAGNOSIS — Z809 Family history of malignant neoplasm, unspecified: Secondary | ICD-10-CM | POA: Diagnosis not present

## 2023-02-25 DIAGNOSIS — K5730 Diverticulosis of large intestine without perforation or abscess without bleeding: Secondary | ICD-10-CM | POA: Diagnosis not present

## 2023-02-25 MED ORDER — SODIUM CHLORIDE 0.9% FLUSH
10.0000 mL | Freq: Once | INTRAVENOUS | Status: AC
  Administered 2023-02-25: 10 mL via INTRAVENOUS

## 2023-02-25 MED ORDER — FERUMOXYTOL INJECTION 510 MG/17 ML
510.0000 mg | Freq: Once | INTRAVENOUS | Status: AC
  Administered 2023-02-25: 510 mg via INTRAVENOUS
  Filled 2023-02-25: qty 510

## 2023-02-25 MED ORDER — SODIUM CHLORIDE 0.9 % IV SOLN: Freq: Once | INTRAVENOUS | Status: AC

## 2023-02-25 MED ORDER — HEPARIN SOD (PORK) LOCK FLUSH 100 UNIT/ML IV SOLN
500.0000 [IU] | Freq: Once | INTRAVENOUS | Status: AC
  Administered 2023-02-25: 500 [IU] via INTRAVENOUS

## 2023-02-25 NOTE — Progress Notes (Signed)
Patient tolerated iron infusion with no complaints voiced.  Peripheral IV site clean and dry with good blood return noted before and after infusion.  Band aid applied.  VSS with discharge and left in satisfactory condition with no s/s of distress noted.

## 2023-02-25 NOTE — Patient Instructions (Signed)

## 2023-03-04 ENCOUNTER — Inpatient Hospital Stay: Payer: Medicare Other

## 2023-03-04 VITALS — BP 149/57 | HR 65 | Temp 98.4°F | Resp 18 | Wt 203.0 lb

## 2023-03-04 DIAGNOSIS — D500 Iron deficiency anemia secondary to blood loss (chronic): Secondary | ICD-10-CM

## 2023-03-04 DIAGNOSIS — Z87891 Personal history of nicotine dependence: Secondary | ICD-10-CM | POA: Diagnosis not present

## 2023-03-04 DIAGNOSIS — Z809 Family history of malignant neoplasm, unspecified: Secondary | ICD-10-CM | POA: Diagnosis not present

## 2023-03-04 DIAGNOSIS — C672 Malignant neoplasm of lateral wall of bladder: Secondary | ICD-10-CM | POA: Diagnosis not present

## 2023-03-04 DIAGNOSIS — K5730 Diverticulosis of large intestine without perforation or abscess without bleeding: Secondary | ICD-10-CM | POA: Diagnosis not present

## 2023-03-04 DIAGNOSIS — D631 Anemia in chronic kidney disease: Secondary | ICD-10-CM | POA: Diagnosis not present

## 2023-03-04 DIAGNOSIS — E039 Hypothyroidism, unspecified: Secondary | ICD-10-CM | POA: Diagnosis not present

## 2023-03-04 DIAGNOSIS — I129 Hypertensive chronic kidney disease with stage 1 through stage 4 chronic kidney disease, or unspecified chronic kidney disease: Secondary | ICD-10-CM | POA: Diagnosis not present

## 2023-03-04 DIAGNOSIS — E611 Iron deficiency: Secondary | ICD-10-CM | POA: Diagnosis not present

## 2023-03-04 DIAGNOSIS — D509 Iron deficiency anemia, unspecified: Secondary | ICD-10-CM

## 2023-03-04 DIAGNOSIS — N189 Chronic kidney disease, unspecified: Secondary | ICD-10-CM | POA: Diagnosis not present

## 2023-03-04 DIAGNOSIS — E785 Hyperlipidemia, unspecified: Secondary | ICD-10-CM | POA: Diagnosis not present

## 2023-03-04 DIAGNOSIS — G629 Polyneuropathy, unspecified: Secondary | ICD-10-CM | POA: Diagnosis not present

## 2023-03-04 MED ORDER — SODIUM CHLORIDE 0.9 % IV SOLN: INTRAVENOUS | Status: DC

## 2023-03-04 MED ORDER — HEPARIN SOD (PORK) LOCK FLUSH 100 UNIT/ML IV SOLN
500.0000 [IU] | Freq: Once | INTRAVENOUS | Status: AC
  Administered 2023-03-04: 500 [IU] via INTRAVENOUS

## 2023-03-04 MED ORDER — SODIUM CHLORIDE 0.9 % IV SOLN
510.0000 mg | Freq: Once | INTRAVENOUS | Status: AC
  Administered 2023-03-04: 510 mg via INTRAVENOUS
  Filled 2023-03-04: qty 510

## 2023-03-04 MED ORDER — SODIUM CHLORIDE 0.9% FLUSH
10.0000 mL | INTRAVENOUS | Status: DC | PRN
  Administered 2023-03-04: 10 mL via INTRAVENOUS

## 2023-03-04 NOTE — Patient Instructions (Signed)

## 2023-03-04 NOTE — Progress Notes (Signed)
Patient presents today for iron infusion. Patient is in satisfactory condition with no new complaints voiced.  Vital signs are stable.  We will proceed with infusion per provider orders.    Feraheme 510 mg given today per MD orders. Tolerated infusion without adverse affects. Vital signs stable. No complaints at this time. Discharged from clinic ambulatory in stable condition. Alert and oriented x 3. F/U with [facility] as scheduled.

## 2023-03-08 IMAGING — CT CT HEAD W/O CM
3 series · 15 of 47 positions shown, 18 images · non-contrast
Comparison: MRI April 09, 2008.

CLINICAL DATA: Dizziness.

EXAM:
CT HEAD WITHOUT CONTRAST
TECHNIQUE: Contiguous axial images were obtained from the base of the skull
through the vertex without intravenous contrast.

[Series 2: head w o · axial · 0.46mm/px · z∈[-5,+140]mm · 9 of 35 slices shown, 12 images]
[im 3/35  brain]
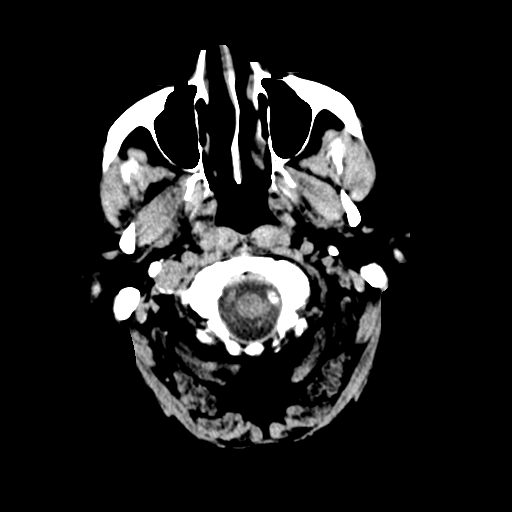
[im 3/35  bone]
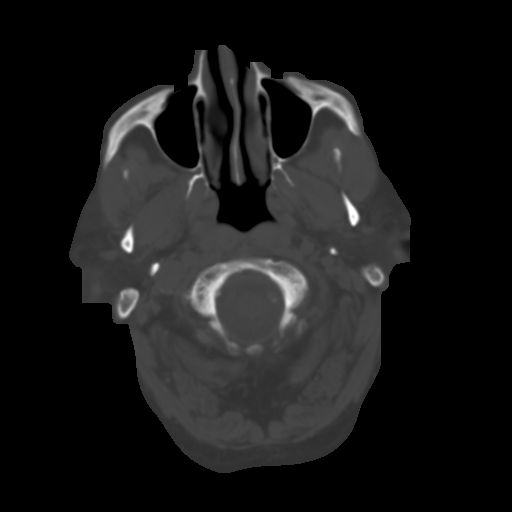
[im 6/35  brain]
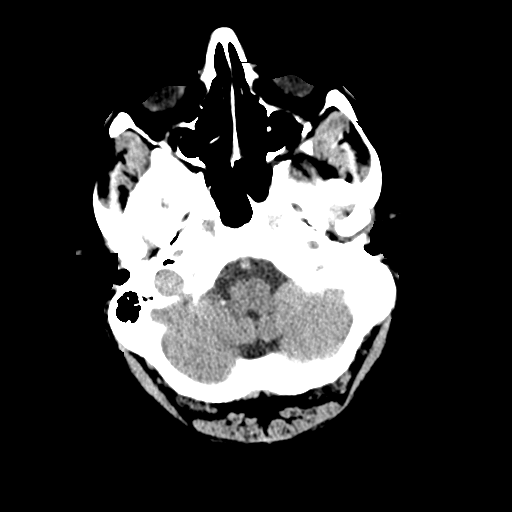
[im 10/35  brain]
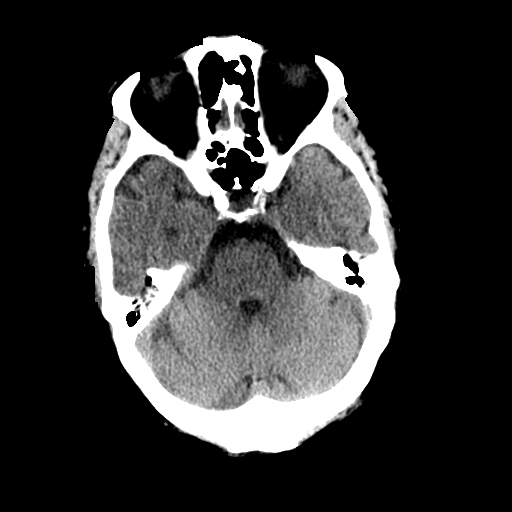
[im 13/35  brain]
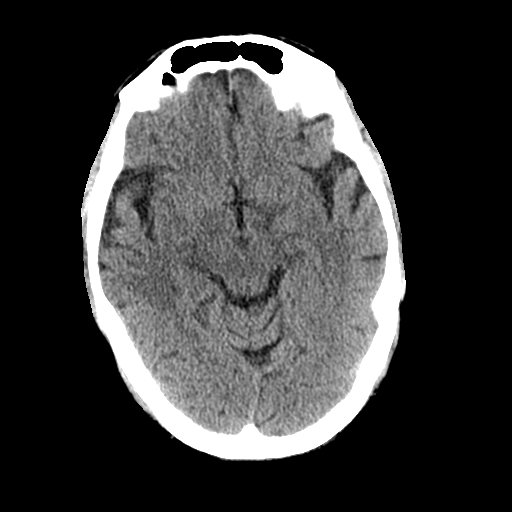
[im 18/35  brain]
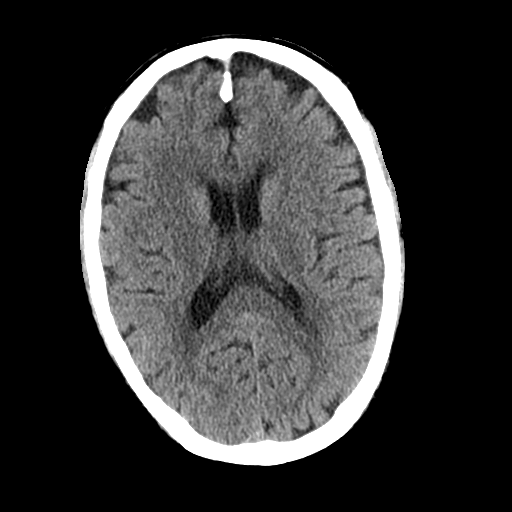
[im 18/35  bone]
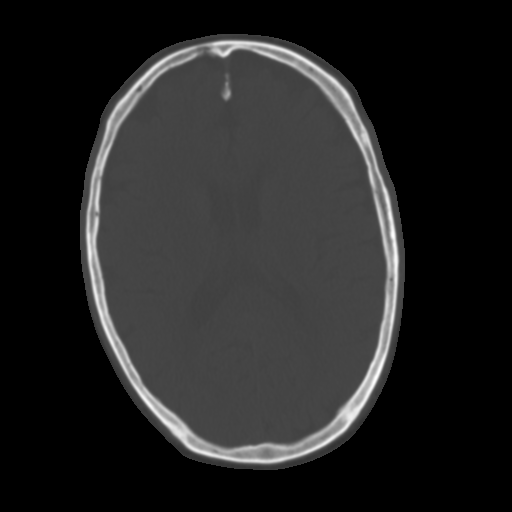
[im 22/35  brain]
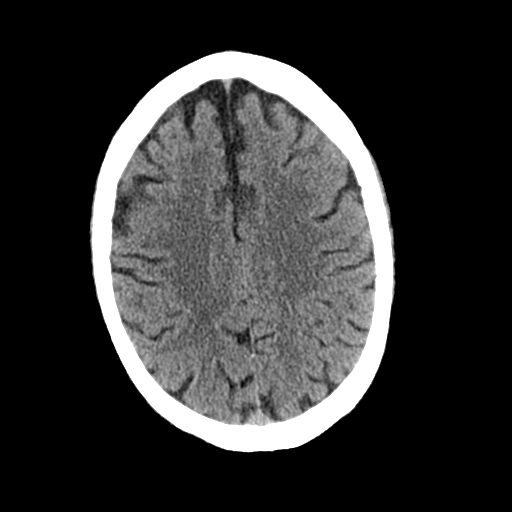
[im 25/35  brain]
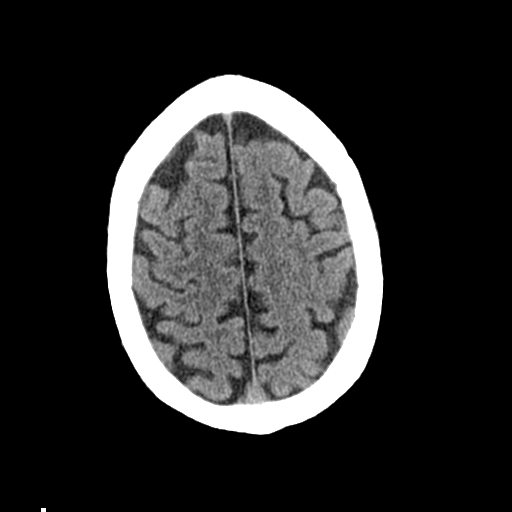
[im 29/35  brain]
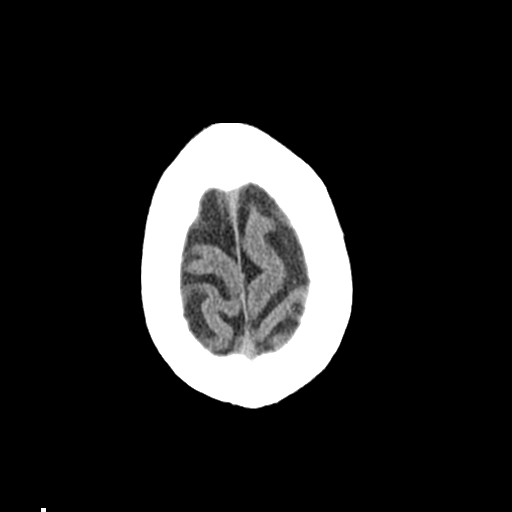
[im 32/35  brain]
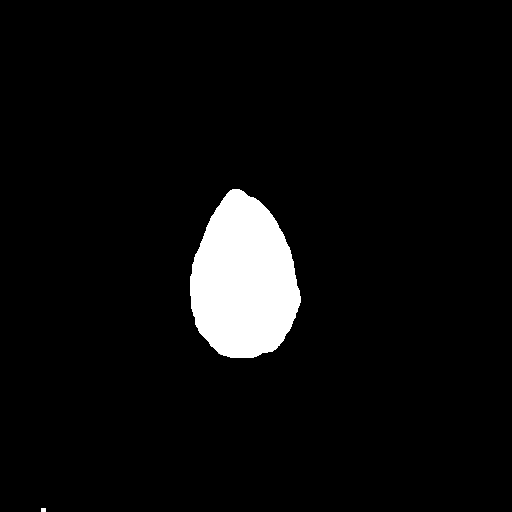
[im 32/35  bone]
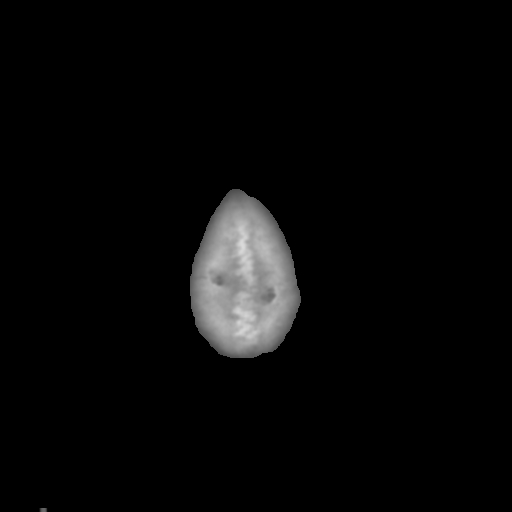

[Series 4: coronal soft · coronal · 0.35mm/px · 3 of 72 slices shown]
[im 24/72  brain]
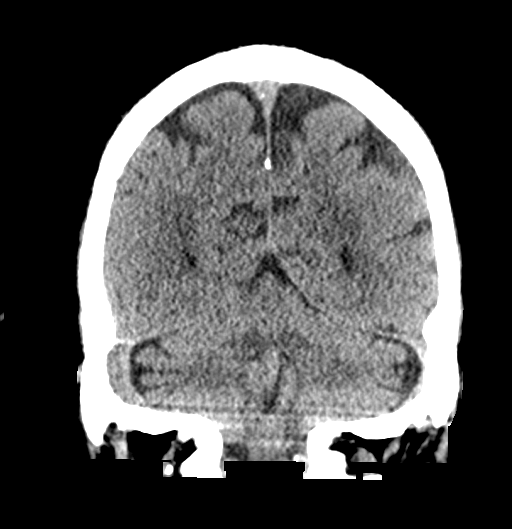
[im 32/72  brain]
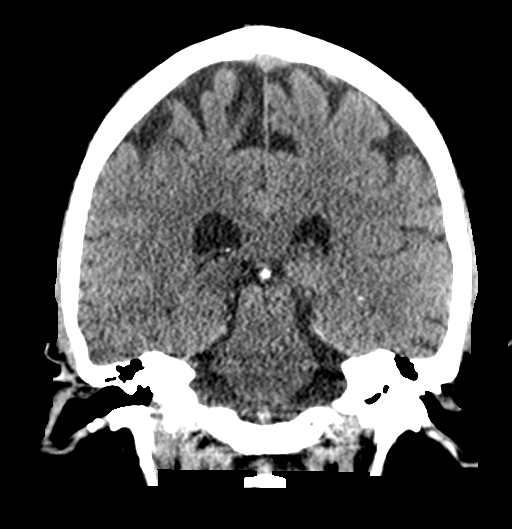
[im 40/72  brain]
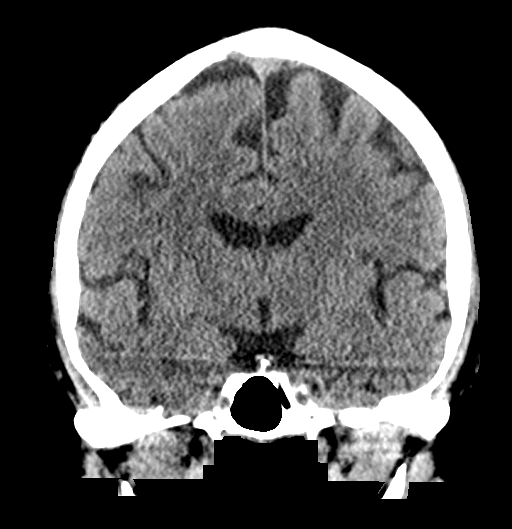

[Series 5: sagittal soft · sagittal · 0.31mm/px · 3 of 63 slices shown]
[im 21/63  brain]
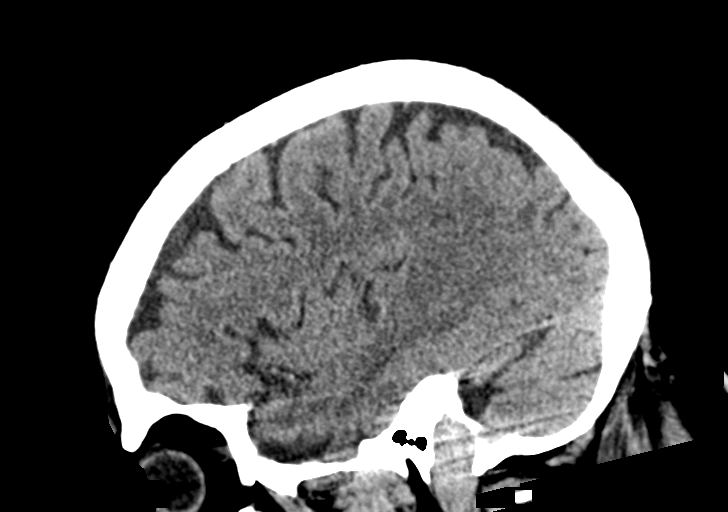
[im 32/63  brain]
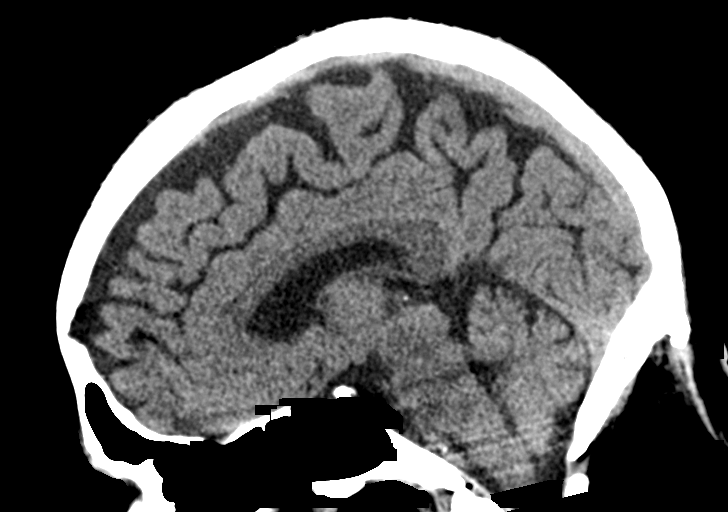
[im 42/63  brain]
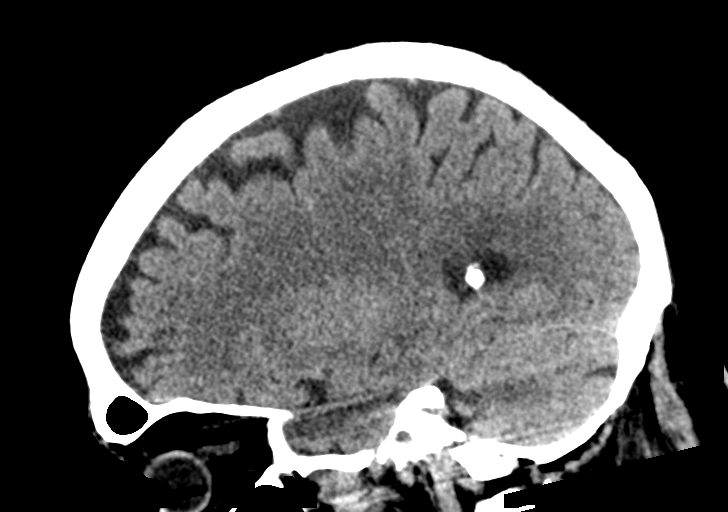

[15 of 47 positions shown; findings below may reference images not displayed]

FINDINGS: Brain: No evidence of acute large vascular territory infarction,
hemorrhage, hydrocephalus, extra-axial collection or mass
lesion/mass effect. Calcification along the anterior falx. Mild for
age patchy white matter hypoattenuation, most likely related to
chronic microvascular ischemic disease.

Vascular: No hyperdense vessel identified. Calcific atherosclerosis.

Skull: No acute fracture.

Sinuses/Orbits: Clear visualized sinuses.  Unremarkable orbits.

Other: No mastoid effusions.
IMPRESSION: No evidence of acute intracranial abnormality.

## 2023-03-11 ENCOUNTER — Emergency Department (HOSPITAL_COMMUNITY)
Admission: EM | Admit: 2023-03-11 | Discharge: 2023-03-11 | Disposition: A | Discharge status: Home / Self Care | Payer: Medicare Other | Attending: Emergency Medicine | Admitting: Emergency Medicine

## 2023-03-11 ENCOUNTER — Other Ambulatory Visit: Payer: Self-pay

## 2023-03-11 ENCOUNTER — Emergency Department (HOSPITAL_COMMUNITY): Payer: Medicare Other

## 2023-03-11 ENCOUNTER — Encounter (HOSPITAL_COMMUNITY): Payer: Self-pay | Admitting: *Deleted

## 2023-03-11 DIAGNOSIS — Z8551 Personal history of malignant neoplasm of bladder: Secondary | ICD-10-CM | POA: Diagnosis not present

## 2023-03-11 DIAGNOSIS — R200 Anesthesia of skin: Secondary | ICD-10-CM | POA: Diagnosis not present

## 2023-03-11 DIAGNOSIS — R220 Localized swelling, mass and lump, head: Secondary | ICD-10-CM | POA: Diagnosis not present

## 2023-03-11 DIAGNOSIS — R2681 Unsteadiness on feet: Secondary | ICD-10-CM

## 2023-03-11 DIAGNOSIS — R0609 Other forms of dyspnea: Secondary | ICD-10-CM | POA: Diagnosis not present

## 2023-03-11 DIAGNOSIS — H532 Diplopia: Secondary | ICD-10-CM | POA: Insufficient documentation

## 2023-03-11 DIAGNOSIS — M5023 Other cervical disc displacement, cervicothoracic region: Secondary | ICD-10-CM | POA: Diagnosis not present

## 2023-03-11 DIAGNOSIS — R479 Unspecified speech disturbances: Secondary | ICD-10-CM | POA: Insufficient documentation

## 2023-03-11 DIAGNOSIS — Z955 Presence of coronary angioplasty implant and graft: Secondary | ICD-10-CM | POA: Diagnosis not present

## 2023-03-11 DIAGNOSIS — I672 Cerebral atherosclerosis: Secondary | ICD-10-CM | POA: Diagnosis not present

## 2023-03-11 DIAGNOSIS — Z7982 Long term (current) use of aspirin: Secondary | ICD-10-CM | POA: Insufficient documentation

## 2023-03-11 DIAGNOSIS — R42 Dizziness and giddiness: Secondary | ICD-10-CM | POA: Insufficient documentation

## 2023-03-11 DIAGNOSIS — R634 Abnormal weight loss: Secondary | ICD-10-CM | POA: Insufficient documentation

## 2023-03-11 DIAGNOSIS — M5021 Other cervical disc displacement,  high cervical region: Secondary | ICD-10-CM | POA: Diagnosis not present

## 2023-03-11 DIAGNOSIS — I6782 Cerebral ischemia: Secondary | ICD-10-CM | POA: Diagnosis not present

## 2023-03-11 DIAGNOSIS — I6523 Occlusion and stenosis of bilateral carotid arteries: Secondary | ICD-10-CM | POA: Diagnosis not present

## 2023-03-11 DIAGNOSIS — I7774 Dissection of vertebral artery: Secondary | ICD-10-CM

## 2023-03-11 DIAGNOSIS — I10 Essential (primary) hypertension: Secondary | ICD-10-CM | POA: Diagnosis not present

## 2023-03-11 LAB — CBC WITH DIFFERENTIAL/PLATELET
Abs Immature Granulocytes: 0.02 10*3/uL (ref 0.00–0.07)
Basophils Absolute: 0 10*3/uL (ref 0.0–0.1)
Basophils Relative: 1 %
Eosinophils Absolute: 0.1 10*3/uL (ref 0.0–0.5)
Eosinophils Relative: 2 %
HCT: 28.9 % — ABNORMAL LOW (ref 39.0–52.0)
Hemoglobin: 9.3 g/dL — ABNORMAL LOW (ref 13.0–17.0)
Immature Granulocytes: 1 %
Lymphocytes Relative: 24 %
Lymphs Abs: 0.9 10*3/uL (ref 0.7–4.0)
MCH: 33.9 pg (ref 26.0–34.0)
MCHC: 32.2 g/dL (ref 30.0–36.0)
MCV: 105.5 fL — ABNORMAL HIGH (ref 80.0–100.0)
Monocytes Absolute: 0.5 10*3/uL (ref 0.1–1.0)
Monocytes Relative: 12 %
Neutro Abs: 2.4 10*3/uL (ref 1.7–7.7)
Neutrophils Relative %: 60 %
Platelets: 197 10*3/uL (ref 150–400)
RBC: 2.74 MIL/uL — ABNORMAL LOW (ref 4.22–5.81)
RDW: 13.9 % (ref 11.5–15.5)
WBC: 3.9 10*3/uL — ABNORMAL LOW (ref 4.0–10.5)
nRBC: 0 % (ref 0.0–0.2)

## 2023-03-11 LAB — COMPREHENSIVE METABOLIC PANEL
ALT: 17 U/L (ref 0–44)
AST: 18 U/L (ref 15–41)
Albumin: 3.6 g/dL (ref 3.5–5.0)
Alkaline Phosphatase: 40 U/L (ref 38–126)
Anion gap: 4 — ABNORMAL LOW (ref 5–15)
BUN: 26 mg/dL — ABNORMAL HIGH (ref 8–23)
CO2: 27 mmol/L (ref 22–32)
Calcium: 8.7 mg/dL — ABNORMAL LOW (ref 8.9–10.3)
Chloride: 107 mmol/L (ref 98–111)
Creatinine, Ser: 1.47 mg/dL — ABNORMAL HIGH (ref 0.61–1.24)
GFR, Estimated: 48 mL/min — ABNORMAL LOW (ref >60)
Glucose, Bld: 100 mg/dL — ABNORMAL HIGH (ref 70–99)
Potassium: 3.9 mmol/L (ref 3.5–5.1)
Sodium: 138 mmol/L (ref 135–145)
Total Bilirubin: 0.4 mg/dL (ref 0.0–1.2)
Total Protein: 6.7 g/dL (ref 6.5–8.1)

## 2023-03-11 LAB — SEDIMENTATION RATE: Sed Rate: 25 mm/h — ABNORMAL HIGH (ref 0–16)

## 2023-03-11 LAB — CBG MONITORING, ED: Glucose-Capillary: 85 mg/dL (ref 70–99)

## 2023-03-11 LAB — C-REACTIVE PROTEIN: CRP: 0.7 mg/dL (ref <1.0)

## 2023-03-11 MED ORDER — GADOBUTROL 1 MMOL/ML IV SOLN
7.5000 mL | Freq: Once | INTRAVENOUS | Status: AC | PRN
  Administered 2023-03-11: 7.5 mL via INTRAVENOUS

## 2023-03-11 MED ORDER — IOHEXOL 350 MG/ML SOLN
75.0000 mL | Freq: Once | INTRAVENOUS | Status: AC | PRN
  Administered 2023-03-11: 75 mL via INTRAVENOUS

## 2023-03-11 MED ORDER — HEPARIN SOD (PORK) LOCK FLUSH 100 UNIT/ML IV SOLN
500.0000 [IU] | Freq: Once | INTRAVENOUS | Status: AC
  Administered 2023-03-11: 500 [IU]
  Filled 2023-03-11: qty 5

## 2023-03-11 MED ORDER — GABAPENTIN 300 MG PO CAPS
600.0000 mg | ORAL_CAPSULE | Freq: Once | ORAL | Status: AC
  Administered 2023-03-11: 600 mg via ORAL
  Filled 2023-03-11: qty 2

## 2023-03-11 MED ORDER — TRAMADOL HCL 50 MG PO TABS
50.0000 mg | ORAL_TABLET | Freq: Once | ORAL | Status: AC
  Administered 2023-03-11: 50 mg via ORAL
  Filled 2023-03-11: qty 1

## 2023-03-11 NOTE — Discharge Instructions (Addendum)
 You are seen in the emergency department for various symptoms including unsteady gait slurred speech double vision.  Your MRI did not show any sign of stroke.  He did have a vertebral dissection which is a tear in one of the blood vessels going to the brain.  You were evaluated by neurology and they feel you can follow-up with an outpatient neurologist for further investigation.  Please continue your regular medications.  Follow-up with your primary care doctor.  Neurology clinic should be calling you to arrange follow-up appointment.  Return to the emergency department if any worsening or concerning symptoms.

## 2023-03-11 NOTE — ED Notes (Signed)
 Received report from [employer]. Pt resting. Nad. A/o. Color wnl.

## 2023-03-11 NOTE — ED Notes (Signed)
 Pt had right chest port a cath accessed. Heparin locked and deaccessed.

## 2023-03-11 NOTE — ED Triage Notes (Addendum)
 Pt c/o intermittent double vision, both upper and lower lips swelling and feeling numb, tongue swelling, staggering when he walks, difficulty articulating words, feeling "fuzzy as if I can't get my thoughts together", and lose of 28-30lbs over the past 2 months. Pt reports he thought he was having an allergic reaction so he has been taking Benadryl which he reports improves the swelling to his lips and tongue.

## 2023-03-11 NOTE — ED Provider Notes (Signed)
 Signout from Dr. Criss Alvine.  78 year old male here with episodes of dizziness diplopia aphasia.  MRI negative for acute stroke.  He is pending CT angio.  Will likely need outpatient neuro follow-up. Physical Exam  BP (!) 148/63   Pulse 61   Temp 97.9 F (36.6 C) (Oral)   Resp (!) 22   Ht 6\' 1"  (1.854 m)   Wt 90.7 kg   SpO2 94%   BMI 26.39 kg/m   Physical Exam  Procedures  Procedures  ED Course / MDM    Medical Decision Making Amount and/or Complexity of Data Reviewed Labs: ordered. Radiology: ordered.  Risk Prescription drug management.   4:30 PM.  Received a call from radiology that the patient has vertebral artery dissection.  I secure message neurology Dr. Thad Ranger.  She is going to get back to me with recommendations.  I updated the patient with this new information.  5:45 PM.  Dr. Thad Ranger did a televisit with the patient.  She is not exactly sure that his dissection is what is leading to his neurologic complaints.  He is already on aspirin and Plavix so she does not feel he needs admission to the hospital.  She is adding on some other lab work for [place] to get but she thinks he can be discharged to follow-up with his PCP and outpatient neurology.  I spoke with patient's daughter [name] and updated her on the plan.  She will try and make sure that he follows up with a neurologist.   Terrilee Files, MD 03/11/23 [phone]

## 2023-03-11 NOTE — Consult Note (Signed)
 [facility] Telemedicine Consult   Requesting Provider: Charm Barges Consult Participants: Nurse Location of the provider: [facility] Location of the patient: [facility]  This consult was provided via telemedicine with 2-way video and audio communication. The patient/family was informed that care would be provided in this way and agreed to receive care in this manner.    Chief Complaint: Diplopia and dizziness  HPI: 78 year old male with a history of CAD s/p stent placement, bladder cancer, HLD, HTN, hypothyroidism, OSA and prostate cancer who presents with complaints of episodic diplopia and dizziness.  Patient reports that around Christmas time he began to have episodes where he would be dizzy, have diplopia, and difficulty with speech.  At times his balance was affected as well.  These episodes last 3-5 minutes and resolve spontaneously.  They occur about once a week.  Is currently not having an episode but he and his daughter were concerned so he came to the ED.   Has also had 30 pound weight loss and complaints of fatigue in that period of time as well.  Feels he has times when his tongue and lips are swollen as if he is allergic to something and has been taking Benadryl during these times.   In work up patient has had MRI of the brain that was reviewed personally.  No acute infarcts noted.  Patient also with CTA of the head and neck.  CTA shows findings consistent with a left vertebral dissection extending from V1 to V3 just prior to the dura with occlusion noted at the distal V3 segment.  Also found to have severe stenosis at the right carotid bifurcation and moderate stenosis at the left carotid bifurcation.   Patient currently asymptomatic other than feeling dry mouth.     Time of teleneurologist evaluation: 1700  Exam: Vitals:   03/11/23 1530 03/11/23 1532  BP: (!) 164/66   Pulse: 61   Resp: 19   Temp:  98 F (36.7 C)  SpO2: 95%     General: NAD  1A: Level of Consciousness -  0  1B: Ask Month and Age - 0 1C: 'Blink Eyes' & 'Squeeze Hands' - 0 2: Test Horizontal Extraocular Movements - 0 3: Test Visual Fields - 0 4: Test Facial Palsy - 0 5A: Test Left Arm Motor Drift - 0 5B: Test Right Arm Motor Drift - 0 6A: Test Left Leg Motor Drift - 0 6B: Test Right Leg Motor Drift - 0 7: Test Limb Ataxia - 0 8: Test Sensation - 1 (right leg) 9: Test Language/Aphasia- 0 10: Test Dysarthria - 0 11: Test Extinction/Inattention - 0 NIHSS score: 1  Patient described cramps on moving lower extremities   Imaging Reviewed:  CLINICAL DATA:  Provided history: Neuro deficit, acute, stroke suspected. Additional history provided: Double vision, lip swelling/numbness, tongue swelling, gait abnormality, speech difficulty, confusion. Additional history obtained from prior radiology records: history of bladder cancer.   EXAM: MRI HEAD WITHOUT AND WITH CONTRAST   MRI CERVICAL SPINE WITHOUT AND WITH CONTRAST   TECHNIQUE: Multiplanar, multiecho pulse sequences of the brain and surrounding structures, and cervical spine, to include the craniocervical junction and cervicothoracic junction, were obtained without and with intravenous contrast.   CONTRAST:  7.25mL GADAVIST GADOBUTROL 1 MMOL/ML IV SOLN   COMPARISON:  Brain MRI 05/28/2020.   FINDINGS: MRI HEAD FINDINGS   Mild intermittent motion degradation. Within this limitation, findings are as follows.   Brain:   Mild generalized cerebral atrophy.   Multifocal T2 FLAIR  hyperintense signal abnormality within the cerebral white matter, nonspecific but compatible with mild-to-moderate chronic small vessel ischemic disease.   There are a few punctate chronic microhemorrhages scattered within the supratentorial brain.   There is no acute infarct.   No evidence of an intracranial mass.   No extra-axial fluid collection.   No midline shift.   No pathologic intracranial enhancement identified.   Vascular: Signal  abnormality within the distal cervical left vertebral artery suggesting vessel occlusion. Flow voids preserved elsewhere within the proximal large arterial vessels.   Skull and upper cervical spine: No focal worrisome marrow lesion.   Sinuses/Orbits: No mass or acute finding within the imaged orbits. Prior bilateral ocular lens replacement. No significant paranasal sinus disease.   MRI CERVICAL SPINE FINDINGS   Intermittently motion degraded examination. Most notably, the axial T2 sequence is moderate-to-severely motion degraded. Within this limitation, findings are as follows.   Alignment: Slight C4-C5 grade 1 anterolisthesis.   Vertebrae: Bridging ventral osteophytes at C2-C3, C3-C4 and C4-C5. Facet ankylosis on the left at C4-C5. C6-C7 vertebral body ankylosis. No focal worrisome marrow lesion.   Cord: Within the limitations of motion degradation, no signal abnormality is identified within the cervical spinal cord. Mild flattening of the ventral spinal cord at C5-C6 as described below. No pathologic spinal cord enhancement.   Posterior Fossa, vertebral arteries, paraspinal tissues: Posterior fossa assessed on same-day brain MRI. Signal abnormality within the visualized cervical left vertebral artery suggesting vessel occlusion. No paraspinal mass or collection.   Disc levels:   Disc degeneration at the non-fused levels, greatest at C5-C6 and C7-T1 (advanced at these levels).   C2-C3: No significant disc herniation or stenosis.   C3-C4: Slight disc bulge. Uncovertebral hypertrophy on the right. Mild facet arthrosis on the right. No significant spinal canal stenosis. Mild right neural foraminal narrowing.   C4-C5: Slight grade 1 anterolisthesis. Posterior element hypertrophy with facet arthropathy on the right and facet ankylosis on the left. No significant spinal canal stenosis. Mild bilateral neural foraminal narrowing.   C5-C6: Posterior disc osteophyte complex  with bilateral disc osteophyte ridge/uncinate hypertrophy. The posterior disc osteophyte complex effaces the thecal sac and slightly flattens the ventral aspect of the spinal cord. However, the dorsal CSF space is maintained within the spinal canal. Severe bilateral neural foraminal narrowing.   C6-C7: Vertebral body ankylosis. Vertebral osteophytosis and bilateral uncovertebral hypertrophy. An endplate osteophyte mildly effaces the ventral thecal sac. Mild bilateral neural foraminal narrowing.   C7-T1: Disc bulge with endplate spurring and bilateral disc osteophyte ridge. No significant spinal canal or foraminal stenosis.   IMPRESSION: MRI brain:   1. Mildly motion degraded exam. 2. No evidence of an acute or recent subacute infarction. 3. Signal abnormality within the distal cervical left vertebral artery suggesting vessel occlusion. Consider MR or CT angiography for further evaluation. 4. Mild-to-moderate chronic small vessel ischemic changes within the cerebral white matter, similar to the prior brain MRI of 05/28/2020. 5. Mild generalized cerebral atrophy.   MRI cervical spine:   1. Motion degraded examination. 2. Within the limitations of motion degradation, no lesion is identified within the cervical spinal cord. 3. Cervical spondylosis with multilevel vertebral ankylosis as described within the body of the report. 4. At C5-C6, there is advanced disc degeneration. A posterior disc osteophyte complex effaces the ventral thecal sac and slightly flattens the ventral aspect of the spinal cord. However, the dorsal CSF space is maintained within the spinal canal. Multifactorial severe bilateral neural foraminal narrowing also present at this  level. 5. No more than mild relative spinal canal narrowing, or mild neural foraminal narrowing, at the remaining levels. 6. Signal abnormality within the visualized cervical left vertebral artery suggesting vessel occlusion. Consider  MR or CT angiography for further evaluation.  CLINICAL DATA:  Intermittent double vision, lip and tongue swelling, numbness, difficulty articulating words. Findings on MRI concerning for possible left vertebral artery occlusion.   EXAM: CT ANGIOGRAPHY HEAD AND NECK WITH AND WITHOUT CONTRAST   TECHNIQUE: Multidetector CT imaging of the head and neck was performed using the standard protocol during bolus administration of intravenous contrast. Multiplanar CT image reconstructions and MIPs were obtained to evaluate the vascular anatomy. Carotid stenosis measurements (when applicable) are obtained utilizing NASCET criteria, using the distal internal carotid diameter as the denominator.   RADIATION DOSE REDUCTION: This exam was performed according to the departmental dose-optimization program which includes automated exposure control, adjustment of the mA and/or kV according to patient size and/or use of iterative reconstruction technique.   CONTRAST:  75mL OMNIPAQUE IOHEXOL 350 MG/ML SOLN   COMPARISON:  Same day MRI head.   FINDINGS: CT HEAD FINDINGS   Brain: No acute intracranial hemorrhage. No CT evidence of acute infarct. Nonspecific hypoattenuation in the periventricular and subcortical white matter favored to reflect chronic microvascular ischemic changes. No edema, mass effect, or midline shift. The basilar cisterns are patent.   Ventricles: Ventricles are normal in size and configuration.   Vascular: No hyperdense vessel. Intracranial atherosclerotic calcifications noted.   Skull: No acute or aggressive finding.   Sinuses/orbits: Bilateral lens replacement. Mild mucosal thickening in the alveolar recess of the right maxillary sinus.   Other: Mastoid air cells are clear.   CTA NECK FINDINGS   Aortic arch: Standard configuration of the aortic arch. Imaged portion shows no evidence of aneurysm or dissection. Mild atherosclerosis. No significant stenosis of the  major arch vessel origins.   Pulmonary arteries: As allowed by contrast timing, limited visualization of the upper lobe pulmonary arteries without focal abnormality.   Subclavian arteries: The subclavian arteries are patent bilaterally. Mild atherosclerosis involving the proximal aspect of both subclavian arteries without significant stenosis.   Right carotid system: Patent. Atherosclerosis at the carotid bifurcation resulting in severe (70% or greater) stenosis. No evidence of dissection.   Left carotid system: Patent. Atherosclerosis at the carotid bifurcation resulting in moderate (50-69%) stenosis. No evidence of dissection.   Vertebral arteries: Codominant. Mild ostial stenosis of the right vertebral artery. Additional atherosclerosis of the the 3 and proximal V4 segments of the right vertebral artery resulting in mild stenosis. Prominent atherosclerosis at the origin of the left vertebral artery resulting in moderate stenosis. There is multifocal irregularity of the left V1 segment. Abnormal soft tissue noted along the wall of the left V1 segment on axial image 627. Focal severe narrowing of the distal V1 segment at the level of C7-T1. There is additional multifocal irregularity of the left V2 segment with focal severe narrowing at the C2-3 level. Additional irregularity of the V3 segment. Focal occlusion of the distal V3 segment just prior to entering the dura. The intracranial left vertebral artery is patent with atherosclerosis and mild stenosis.   Skeleton: Partially visualized sequelae of median sternotomy. Confluent anterior endplate osteophytes in the cervical and thoracic spine which may reflect DI SH. No acute osseous abnormality.   Other neck: The visualized airway is patent. No cervical lymphadenopathy. Prominent anterior endplate osteophytes in the upper thoracic spine which likely compress the esophagus. Associated patulous appearance of  the cervical  esophagus above this level. Hypoplastic appearance of thyroid.   Upper chest: Visualized lung apices are clear. Right chest wall port with catheter entering the SVC.   Review of the MIP images confirms the above findings   CTA HEAD FINDINGS   ANTERIOR CIRCULATION: The intracranial ICAs are patent bilaterally. Atherosclerosis of the bilateral carotid siphons. Moderate stenosis of the cavernous and ophthalmic right ICA. Additional mild atherosclerosis of the cavernous and ophthalmic left ICA. No proximal occlusion, aneurysm, or vascular malformation.   MCAs: The middle cerebral arteries are patent bilaterally.   ACAs: Patent bilaterally.  Ascus configuration of the ACAs.   POSTERIOR CIRCULATION: No significant stenosis, proximal occlusion, aneurysm, or vascular malformation.   PCAs: The posterior cerebral arteries are patent bilaterally.   Pcomm: Not well visualized.   SCAs: The superior cerebellar arteries are patent bilaterally.   Basilar artery: Patent   AICAs: Visualized on the right.   PICAs: Visualized on the left.   Vertebral arteries: The intracranial vertebral arteries are patent.   Venous sinuses: As permitted by contrast timing, patent.   Anatomic variants: None   Review of the MIP images confirms the above findings   IMPRESSION: 1. Abnormal soft tissue along the wall of the V1 segment left vertebral artery with multifocal irregularity of the vessel from the V1 segment to the V3 segment. Focal occlusion of the distal V3 segment just prior to entering the dura. Findings concerning for vertebral artery dissection. 2. Severe (70% or greater) stenosis of the right carotid bifurcation. 3. Moderate (50-69%) stenosis of the left carotid bifurcation. 4. Moderate stenosis of the cavernous and ophthalmic right ICA. 5. No intracranial large vessel occlusion. 6. No acute intracranial abnormality. Mild chronic microvascular ischemic changes. 7. Prominent anterior  endplate osteophytes in the upper thoracic spine which likely compress the esophagus. Associated patulous appearance of the cervical esophagus above this level.    Labs reviewed in epic and pertinent values follow: CBG 85 B12 189 TSH 2.888  Assessment: 78 year old male with a history of CAD s/p stent placement, bladder cancer, HLD, HTN, hypothyroidism, OSA and prostate cancer who presents with complaints of episodic diplopia and dizziness.  Patient reports that around Christmas time he began to have episodes where he would be dizzy, have diplopia, and difficulty with speech.  At times his balance was affected as well.  These episodes last 3-5 minutes and resolve spontaneously.  They occur about once a week.  Is currently not having an episode but he and his daughter were concerned so he came to the ED.   Has also had 30 pound weight loss and complaints of fatigue in that period of time as well.  Feels he has times when his tongue and lips are swollen as if he is allergic to something and has been taking Benadryl during these times.   In work up patient has had MRI of the brain that was reviewed personally.  No acute infarcts noted.  Patient also with CTA of the head and neck.  CTA shows findings consistent with a left vertebral dissection extending from V1 to V3 just prior to the dura with occlusion noted at the distal V3 segment.  Also found to have severe stenosis at the right carotid bifurcation and moderate stenosis at the left carotid bifurcation.   Patient currently asymptomatic other than feeling dry mouth.  Although vertebral dissection noted on CTA,symptoms have been intermittently present for 2 months and patient is currently asymptomatic.  MRI of the  brain shows no ischemic sequelae.  Would consider other possible diagnoses as well that include autoimmune disorders and myasthenia.    Recommendations:  Continue ASA 81mg  and Plavix 75mg  daily Patient to have acetylcholine binding and  striated muscle labs drawn as well as CRP and ESR.  TSH and B12 are normal.  These to be followed up on an outpatient basis. Patient to follow up outpatient.      This patient is receiving care for possible acute neurological changes. There was 40 minutes of care by this provider at the time of service, including time for direct evaluation via telemedicine, review of medical records, imaging studies and discussion of findings with providers, the patient and/or family.  Thana Farr, MD Neurology   If 8pm- 8am, please page neurology on call as listed in AMION.

## 2023-03-11 NOTE — ED Notes (Signed)
 Teleneuro in progress. NIH negative

## 2023-03-11 NOTE — ED Provider Notes (Signed)
  EMERGENCY DEPARTMENT AT [facility] Provider Note   CSN: [id number] Arrival date & time: 03/11/23  4098     History  Chief Complaint  Patient presents with   Diplopia    [name] is a 78 y.o. male.  HPI 78 year old male with a chief complaint of double vision.  For the past couple months he has been having more frequent vertigo than typical and it comes and goes.  For the past couple weeks he has noticed intermittent double vision, his left eye seems to be a little worse than his right but is with both.  He intermittently has trouble speaking and has noticed that his tongue and lips seem to swell.  He has taken some Benadryl and it seems to help with the lip swelling.  He has had weight loss over the past few months up to about 30 pounds.  Has had some chronically and mildly worsening dyspnea on exertion. No chest pain. He feels like his right hand was slightly weaker than his left when he was trying to open a jar. Has a history of bladder cancer.   Home Medications Prior to Admission medications   Medication Sig Start Date End Date Taking? Authorizing Provider  acetaminophen (TYLENOL) 500 MG tablet Take 500 mg by mouth every 6 (six) hours as needed for moderate pain (pain score 4-6).   Yes [provider]  amLODipine (NORVASC) 10 MG tablet Take 10 mg by mouth in the morning. 04/20/20  Yes [provider]  aspirin EC 81 MG tablet Take 81 mg by mouth every evening. 06/17/17  Yes Rehman, Joline Maxcy, MD  atorvastatin (LIPITOR) 40 MG tablet TAKE 1/2 TABLET(20 MG) BY MOUTH EVERY EVENING 06/30/22  Yes [name], MD  carvedilol (COREG) 6.25 MG tablet TAKE 1 TABLET(6.25 MG) BY MOUTH TWICE DAILY 12/28/22  Yes [name], MD  cetirizine (ZYRTEC) 10 MG tablet Take 10 mg by mouth every evening.   Yes [provider]  clopidogrel (PLAVIX) 75 MG tablet Take 1 tablet (75 mg total) by mouth in the morning. 09/24/20  Yes Bhagat, Bhavinkumar,  PA  Coenzyme Q10 300 MG CAPS Take 300 mg by mouth every evening.   Yes [provider]  fenofibrate 160 MG tablet Take 160 mg by mouth daily. 01/28/23  Yes [provider]  gabapentin (NEURONTIN) 600 MG tablet Take 600 mg by mouth 3 (three) times daily. 02/27/15  Yes [provider]  GLUCOSAMINE-CHONDROITIN PO Take 1 tablet by mouth every evening.   Yes [provider]  isosorbide mononitrate (IMDUR) 60 MG 24 hr tablet TAKE 1 AND 1/2 TABLETS(90 MG) BY MOUTH DAILY 10/13/22  Yes Sharlene Dory, NP  levothyroxine (SYNTHROID) 175 MCG tablet Take 175 mcg by mouth daily. 09/30/22  Yes [provider]  meclizine (ANTIVERT) 25 MG tablet Take 1 tablet (25 mg total) by mouth 2 (two) times daily as needed for dizziness. 05/29/20  Yes Azucena Fallen, MD  Polyethyl Glycol-Propyl Glycol (LUBRICANT EYE DROPS) 0.4-0.3 % SOLN Place 1-2 drops into both eyes 3 (three) times daily as needed (dry/irritated eyes.).   Yes [provider]  traMADol (ULTRAM) 50 MG tablet Take 1 tablet (50 mg total) by mouth every 6 (six) hours as needed. 01/25/23  Yes Betha Loa, MD  nitroGLYCERIN (NITROSTAT) 0.4 MG SL tablet Place 1 tablet (0.4 mg total) under the tongue every 5 (five) minutes as needed for chest pain. Patient not taking: Reported on 02/17/2023 12/31/21   Excell Seltzer,  Casimiro Needle, MD      Allergies    Codeine, Gemtesa [vibegron], and Pollen extract    Review of Systems   Review of Systems  Constitutional:  Positive for unexpected weight change.  Eyes:  Positive for visual disturbance.  Respiratory:  Positive for shortness of breath.   Cardiovascular:  Negative for chest pain.  Neurological:  Positive for dizziness, speech difficulty and weakness. Negative for headaches.    Physical Exam Updated Vital Signs BP (!) 148/63   Pulse 61   Temp 97.9 F (36.6 C) (Oral)   Resp 19   Ht 6\' 1"  (1.854 m)   Wt 90.7 kg   SpO2 94%   BMI 26.39 kg/m  Physical Exam Vitals  and nursing note reviewed.  Constitutional:      Appearance: He is well-developed.  HENT:     Head: Normocephalic and atraumatic.     Mouth/Throat:     Comments: Lips, tongue and oropharynx appear to be normal with no obvious swelling. Eyes:     Extraocular Movements: Extraocular movements intact.     Pupils: Pupils are equal, round, and reactive to light.     Comments: No visual field deficit  Cardiovascular:     Rate and Rhythm: Normal rate and regular rhythm.     Heart sounds: Normal heart sounds.  Pulmonary:     Effort: Pulmonary effort is normal.     Breath sounds: Normal breath sounds.  Abdominal:     Palpations: Abdomen is soft.     Tenderness: There is no abdominal tenderness.  Skin:    General: Skin is warm and dry.  Neurological:     Mental Status: He is alert.     Comments: CN 3-12 grossly intact. 5/5 strength in all 4 extremities but slightly weaker on the right arm and leg compared to left. Grossly normal sensation. Normal finger to nose. I do not appreciate any slurred speech.     ED Results / Procedures / Treatments   Labs (all labs ordered are listed, but only abnormal results are displayed) Labs Reviewed  CBC WITH DIFFERENTIAL/PLATELET - Abnormal; Notable for the following components:      Result Value   WBC 3.9 (*)    RBC 2.74 (*)    Hemoglobin 9.3 (*)    HCT 28.9 (*)    MCV 105.5 (*)    All other components within normal limits  COMPREHENSIVE METABOLIC PANEL - Abnormal; Notable for the following components:   Glucose, Bld 100 (*)    BUN 26 (*)    Creatinine, Ser 1.47 (*)    Calcium 8.7 (*)    GFR, Estimated 48 (*)    Anion gap 4 (*)    All other components within normal limits  CBG MONITORING, ED    EKG None  Radiology MR Brain W and Wo Contrast Result Date: 03/11/2023 CLINICAL DATA:  Provided history: Neuro deficit, acute, stroke suspected. Additional history provided: Double vision, lip swelling/numbness, tongue swelling, gait abnormality,  speech difficulty, confusion. Additional history obtained from prior radiology records: history of bladder cancer. EXAM: MRI HEAD WITHOUT AND WITH CONTRAST MRI CERVICAL SPINE WITHOUT AND WITH CONTRAST TECHNIQUE: Multiplanar, multiecho pulse sequences of the brain and surrounding structures, and cervical spine, to include the craniocervical junction and cervicothoracic junction, were obtained without and with intravenous contrast. CONTRAST:  7.82mL GADAVIST GADOBUTROL 1 MMOL/ML IV SOLN COMPARISON:  Brain MRI 05/28/2020. FINDINGS: MRI HEAD FINDINGS Mild intermittent motion degradation. Within this limitation, findings are as follows. Brain:  Mild generalized cerebral atrophy. Multifocal T2 FLAIR hyperintense signal abnormality within the cerebral white matter, nonspecific but compatible with mild-to-moderate chronic small vessel ischemic disease. There are a few punctate chronic microhemorrhages scattered within the supratentorial brain. There is no acute infarct. No evidence of an intracranial mass. No extra-axial fluid collection. No midline shift. No pathologic intracranial enhancement identified. Vascular: Signal abnormality within the distal cervical left vertebral artery suggesting vessel occlusion. Flow voids preserved elsewhere within the proximal large arterial vessels. Skull and upper cervical spine: No focal worrisome marrow lesion. Sinuses/Orbits: No mass or acute finding within the imaged orbits. Prior bilateral ocular lens replacement. No significant paranasal sinus disease. MRI CERVICAL SPINE FINDINGS Intermittently motion degraded examination. Most notably, the axial T2 sequence is moderate-to-severely motion degraded. Within this limitation, findings are as follows. Alignment: Slight C4-C5 grade 1 anterolisthesis. Vertebrae: Bridging ventral osteophytes at C2-C3, C3-C4 and C4-C5. Facet ankylosis on the left at C4-C5. C6-C7 vertebral body ankylosis. No focal worrisome marrow lesion. Cord: Within the  limitations of motion degradation, no signal abnormality is identified within the cervical spinal cord. Mild flattening of the ventral spinal cord at C5-C6 as described below. No pathologic spinal cord enhancement. Posterior Fossa, vertebral arteries, paraspinal tissues: Posterior fossa assessed on same-day brain MRI. Signal abnormality within the visualized cervical left vertebral artery suggesting vessel occlusion. No paraspinal mass or collection. Disc levels: Disc degeneration at the non-fused levels, greatest at C5-C6 and C7-T1 (advanced at these levels). C2-C3: No significant disc herniation or stenosis. C3-C4: Slight disc bulge. Uncovertebral hypertrophy on the right. Mild facet arthrosis on the right. No significant spinal canal stenosis. Mild right neural foraminal narrowing. C4-C5: Slight grade 1 anterolisthesis. Posterior element hypertrophy with facet arthropathy on the right and facet ankylosis on the left. No significant spinal canal stenosis. Mild bilateral neural foraminal narrowing. C5-C6: Posterior disc osteophyte complex with bilateral disc osteophyte ridge/uncinate hypertrophy. The posterior disc osteophyte complex effaces the thecal sac and slightly flattens the ventral aspect of the spinal cord. However, the dorsal CSF space is maintained within the spinal canal. Severe bilateral neural foraminal narrowing. C6-C7: Vertebral body ankylosis. Vertebral osteophytosis and bilateral uncovertebral hypertrophy. An endplate osteophyte mildly effaces the ventral thecal sac. Mild bilateral neural foraminal narrowing. C7-T1: Disc bulge with endplate spurring and bilateral disc osteophyte ridge. No significant spinal canal or foraminal stenosis. IMPRESSION: MRI brain: 1. Mildly motion degraded exam. 2. No evidence of an acute or recent subacute infarction. 3. Signal abnormality within the distal cervical left vertebral artery suggesting vessel occlusion. Consider MR or CT angiography for further evaluation.  4. Mild-to-moderate chronic small vessel ischemic changes within the cerebral white matter, similar to the prior brain MRI of 05/28/2020. 5. Mild generalized cerebral atrophy. MRI cervical spine: 1. Motion degraded examination. 2. Within the limitations of motion degradation, no lesion is identified within the cervical spinal cord. 3. Cervical spondylosis with multilevel vertebral ankylosis as described within the body of the report. 4. At C5-C6, there is advanced disc degeneration. A posterior disc osteophyte complex effaces the ventral thecal sac and slightly flattens the ventral aspect of the spinal cord. However, the dorsal CSF space is maintained within the spinal canal. Multifactorial severe bilateral neural foraminal narrowing also present at this level. 5. No more than mild relative spinal canal narrowing, or mild neural foraminal narrowing, at the remaining levels. 6. Signal abnormality within the visualized cervical left vertebral artery suggesting vessel occlusion. Consider MR or CT angiography for further evaluation. Electronically Signed   By: Ronaldo Miyamoto  Renette Butters D.O.   On: 03/11/2023 14:25   MR Cervical Spine W and Wo Contrast Result Date: 03/11/2023 CLINICAL DATA:  Provided history: Neuro deficit, acute, stroke suspected. Additional history provided: Double vision, lip swelling/numbness, tongue swelling, gait abnormality, speech difficulty, confusion. Additional history obtained from prior radiology records: history of bladder cancer. EXAM: MRI HEAD WITHOUT AND WITH CONTRAST MRI CERVICAL SPINE WITHOUT AND WITH CONTRAST TECHNIQUE: Multiplanar, multiecho pulse sequences of the brain and surrounding structures, and cervical spine, to include the craniocervical junction and cervicothoracic junction, were obtained without and with intravenous contrast. CONTRAST:  7.63mL GADAVIST GADOBUTROL 1 MMOL/ML IV SOLN COMPARISON:  Brain MRI 05/28/2020. FINDINGS: MRI HEAD FINDINGS Mild intermittent motion degradation.  Within this limitation, findings are as follows. Brain: Mild generalized cerebral atrophy. Multifocal T2 FLAIR hyperintense signal abnormality within the cerebral white matter, nonspecific but compatible with mild-to-moderate chronic small vessel ischemic disease. There are a few punctate chronic microhemorrhages scattered within the supratentorial brain. There is no acute infarct. No evidence of an intracranial mass. No extra-axial fluid collection. No midline shift. No pathologic intracranial enhancement identified. Vascular: Signal abnormality within the distal cervical left vertebral artery suggesting vessel occlusion. Flow voids preserved elsewhere within the proximal large arterial vessels. Skull and upper cervical spine: No focal worrisome marrow lesion. Sinuses/Orbits: No mass or acute finding within the imaged orbits. Prior bilateral ocular lens replacement. No significant paranasal sinus disease. MRI CERVICAL SPINE FINDINGS Intermittently motion degraded examination. Most notably, the axial T2 sequence is moderate-to-severely motion degraded. Within this limitation, findings are as follows. Alignment: Slight C4-C5 grade 1 anterolisthesis. Vertebrae: Bridging ventral osteophytes at C2-C3, C3-C4 and C4-C5. Facet ankylosis on the left at C4-C5. C6-C7 vertebral body ankylosis. No focal worrisome marrow lesion. Cord: Within the limitations of motion degradation, no signal abnormality is identified within the cervical spinal cord. Mild flattening of the ventral spinal cord at C5-C6 as described below. No pathologic spinal cord enhancement. Posterior Fossa, vertebral arteries, paraspinal tissues: Posterior fossa assessed on same-day brain MRI. Signal abnormality within the visualized cervical left vertebral artery suggesting vessel occlusion. No paraspinal mass or collection. Disc levels: Disc degeneration at the non-fused levels, greatest at C5-C6 and C7-T1 (advanced at these levels). C2-C3: No significant disc  herniation or stenosis. C3-C4: Slight disc bulge. Uncovertebral hypertrophy on the right. Mild facet arthrosis on the right. No significant spinal canal stenosis. Mild right neural foraminal narrowing. C4-C5: Slight grade 1 anterolisthesis. Posterior element hypertrophy with facet arthropathy on the right and facet ankylosis on the left. No significant spinal canal stenosis. Mild bilateral neural foraminal narrowing. C5-C6: Posterior disc osteophyte complex with bilateral disc osteophyte ridge/uncinate hypertrophy. The posterior disc osteophyte complex effaces the thecal sac and slightly flattens the ventral aspect of the spinal cord. However, the dorsal CSF space is maintained within the spinal canal. Severe bilateral neural foraminal narrowing. C6-C7: Vertebral body ankylosis. Vertebral osteophytosis and bilateral uncovertebral hypertrophy. An endplate osteophyte mildly effaces the ventral thecal sac. Mild bilateral neural foraminal narrowing. C7-T1: Disc bulge with endplate spurring and bilateral disc osteophyte ridge. No significant spinal canal or foraminal stenosis. IMPRESSION: MRI brain: 1. Mildly motion degraded exam. 2. No evidence of an acute or recent subacute infarction. 3. Signal abnormality within the distal cervical left vertebral artery suggesting vessel occlusion. Consider MR or CT angiography for further evaluation. 4. Mild-to-moderate chronic small vessel ischemic changes within the cerebral white matter, similar to the prior brain MRI of 05/28/2020. 5. Mild generalized cerebral atrophy. MRI cervical spine: 1. Motion degraded examination.  2. Within the limitations of motion degradation, no lesion is identified within the cervical spinal cord. 3. Cervical spondylosis with multilevel vertebral ankylosis as described within the body of the report. 4. At C5-C6, there is advanced disc degeneration. A posterior disc osteophyte complex effaces the ventral thecal sac and slightly flattens the ventral  aspect of the spinal cord. However, the dorsal CSF space is maintained within the spinal canal. Multifactorial severe bilateral neural foraminal narrowing also present at this level. 5. No more than mild relative spinal canal narrowing, or mild neural foraminal narrowing, at the remaining levels. 6. Signal abnormality within the visualized cervical left vertebral artery suggesting vessel occlusion. Consider MR or CT angiography for further evaluation. Electronically Signed   By: Jackey Loge D.O.   On: 03/11/2023 14:25   DG Chest 2 View Result Date: 03/11/2023 CLINICAL DATA:  Dyspnea on exertion EXAM: CHEST - 2 VIEW COMPARISON:  X-ray 05/28/2020 FINDINGS: Sternal wires. Normal cardiopericardial silhouette. Coronary stent noted on the lateral view. No pneumothorax, effusion or edema. Overlapping cardiac leads. Right IJ chest port with tip along the brachiocephalic SVC junction. This appears retracted from port placement fluoroscopy of 02/24/2022. Please correlate with history and functionality of the catheter. Degenerative changes along the spine. IMPRESSION: Postop chest. Right IJ chest port. The catheter appears retracted relative to previous films. Please correlate with history and functionality of the catheter. Additional workup as clinically appropriate Electronically Signed   By: Karen Kays M.D.   On: 03/11/2023 13:11    Procedures Procedures    Medications Ordered in ED Medications  iohexol (OMNIPAQUE) 350 MG/ML injection 75 mL (has no administration in time range)  gadobutrol (GADAVIST) 1 MMOL/ML injection 7.5 mL (7.5 mLs Intravenous Contrast Given 03/11/23 1258)    ED Course/ Medical Decision Making/ A&P                                 Medical Decision Making Amount and/or Complexity of Data Reviewed Labs: ordered.    Details: Chronic anemia Radiology: ordered and independent interpretation performed.    Details: No stroke on MRI  Risk Prescription drug management.   Patient  presents with on and off different neuro sounding symptoms for weeks.  MRI does not show stroke or mass.  There is concern for possible vertebral artery occlusion and so a CTA will be obtained.  Otherwise, likely needs outpatient neuro follow-up.  Care transferred to Dr. Charm Barges.        Final Clinical Impression(s) / ED Diagnoses Final diagnoses:  None    Rx / DC Orders ED Discharge Orders     None         Pricilla Loveless, MD 03/11/23 [phone]

## 2023-03-14 ENCOUNTER — Encounter: Payer: Self-pay | Admitting: Hematology

## 2023-03-15 ENCOUNTER — Encounter: Payer: Self-pay | Admitting: Diagnostic Neuroimaging

## 2023-03-15 ENCOUNTER — Ambulatory Visit: Admitting: Diagnostic Neuroimaging

## 2023-03-15 VITALS — BP 138/72 | HR 62 | Ht 73.0 in | Wt 200.0 lb

## 2023-03-15 DIAGNOSIS — R4781 Slurred speech: Secondary | ICD-10-CM

## 2023-03-15 DIAGNOSIS — H532 Diplopia: Secondary | ICD-10-CM | POA: Diagnosis not present

## 2023-03-15 DIAGNOSIS — H02403 Unspecified ptosis of bilateral eyelids: Secondary | ICD-10-CM

## 2023-03-15 LAB — STRIATED MUSCLE ANTIBODY: Anti-striation Abs: NEGATIVE

## 2023-03-15 LAB — ACETYLCHOLINE RECEPTOR, BINDING: Acety choline binding ab: 49.5 nmol/L — ABNORMAL HIGH (ref 0.00–0.24)

## 2023-03-15 MED ORDER — PYRIDOSTIGMINE BROMIDE 60 MG PO TABS: 30.0000 mg | ORAL_TABLET | Freq: Three times a day (TID) | ORAL | 6 refills | Status: DC

## 2023-03-15 NOTE — Progress Notes (Signed)
 [facility]  PATIENT: [name] DOB: 20-Dec-1945  REFERRING CLINICIAN: Terrilee Files, MD HISTORY FROM: patient  REASON FOR VISIT: new consult   HISTORICAL  CHIEF COMPLAINT:  Chief Complaint  Patient presents with   New Patient (Initial Visit)    Pt in 6 alone Pt here for blurry vision Pt states when he turns head to right double vision Pt states some brain fog     HISTORY OF PRESENT ILLNESS:   78 year old male here for evaluation of double vision and slurred speech.  Patient presented to hospital on 03/11/2023 for several months of intermittent double vision, lip swelling, numb feeling, tongue swelling, gait difficulty, slurred speech, fuzzy thoughts, 30 pound weight loss.  Patient had MRI of the brain which was unremarkable.  CTA of the head and neck showed possible left vertebral artery dissection extending from V1-V3 segment.  Also found to have other bilateral carotid bifurcation stenoses.  He was started on aspirin and Plavix and recommended to follow-up in neurology clinic.  Myasthenia gravis antibody testing was sent off but results not back yet.    REVIEW OF SYSTEMS: Full 14 system review of systems performed and negative with exception of: as per HPI.  ALLERGIES: Allergies  Allergen Reactions   Codeine Nausea Only   Gemtesa [Vibegron] Other (See Comments)    Patient reports swelling of throat, lips and dizziness when taking this medication   Pollen Extract Other (See Comments)    Seasonal    HOME MEDICATIONS: Outpatient Medications Prior to Visit  Medication Sig Dispense Refill   acetaminophen (TYLENOL) 500 MG tablet Take 500 mg by mouth every 6 (six) hours as needed for moderate pain (pain score 4-6).     amLODipine (NORVASC) 10 MG tablet Take 10 mg by mouth in the morning.     aspirin EC 81 MG tablet Take 81 mg by mouth every evening. 30 tablet    atorvastatin (LIPITOR) 40 MG tablet TAKE 1/2 TABLET(20 MG) BY MOUTH EVERY EVENING  45 tablet 3   carvedilol (COREG) 6.25 MG tablet TAKE 1 TABLET(6.25 MG) BY MOUTH TWICE DAILY 180 tablet 1   cetirizine (ZYRTEC) 10 MG tablet Take 10 mg by mouth every evening.     clopidogrel (PLAVIX) 75 MG tablet Take 1 tablet (75 mg total) by mouth in the morning. 90 tablet 3   Coenzyme Q10 300 MG CAPS Take 300 mg by mouth every evening.     fenofibrate 160 MG tablet Take 160 mg by mouth daily.     gabapentin (NEURONTIN) 600 MG tablet Take 600 mg by mouth 3 (three) times daily.  3   GLUCOSAMINE-CHONDROITIN PO Take 1 tablet by mouth every evening.     isosorbide mononitrate (IMDUR) 60 MG 24 hr tablet TAKE 1 AND 1/2 TABLETS(90 MG) BY MOUTH DAILY 45 tablet 7   levothyroxine (SYNTHROID) 175 MCG tablet Take 175 mcg by mouth daily.     meclizine (ANTIVERT) 25 MG tablet Take 1 tablet (25 mg total) by mouth 2 (two) times daily as needed for dizziness. 30 tablet 0   nitroGLYCERIN (NITROSTAT) 0.4 MG SL tablet Place 1 tablet (0.4 mg total) under the tongue every 5 (five) minutes as needed for chest pain. 25 tablet 11   Polyethyl Glycol-Propyl Glycol (LUBRICANT EYE DROPS) 0.4-0.3 % SOLN Place 1-2 drops into both eyes 3 (three) times daily as needed (dry/irritated eyes.).     traMADol (ULTRAM) 50 MG tablet Take 1 tablet (50 mg total) by mouth every 6 (  six) hours as needed. 20 tablet 0   No facility-administered medications prior to visit.    PAST MEDICAL HISTORY: Past Medical History:  Diagnosis Date   Anginal pain (HCC)    Arthritis    "all over"   Bladder cancer [facility])    chemo/radiation   Bladder tumor    Chronic lower back pain    Coronary artery disease    a. s/p CABG x 2 (LIMA->LAD, RIMA->RCA);  b. s/p multiple PCI's to Ramus;  c. 08/2011 Cath/PCI: LM 70% into ramus with 70-80% there->treated wtih 3.5x18 Xience Xpedition DES, LCX  nonobs, RCA occluded.  RIMA & LIMA patent, EF 55-65%   DJD (degenerative joint disease)    GERD (gastroesophageal reflux disease)    History of gout     Hyperlipidemia    Hypertension    Hypothyroidism    Neuropathy    OSA on CPAP    Prostate cancer (HCC)     PAST SURGICAL HISTORY: Past Surgical History:  Procedure Laterality Date   ABDOMINAL HERNIA REPAIR     BIOPSY  11/26/2015   Procedure: BIOPSY;  Surgeon: Malissa Hippo, MD;  [facility];  Service: Endoscopy;;  gastric esophagus   BIOPSY  01/20/2021   Procedure: BIOPSY;  Surgeon: Dolores Frame, MD;  [facility];  Service: Gastroenterology;;   BIOPSY  10/28/2021   Procedure: BIOPSY;  Surgeon: Dolores Frame, MD;  [facility];  Service: Gastroenterology;;   BLADDER TUMOR EXCISION     BROW LIFT Bilateral 04/27/2019   Procedure: BILATERAL BLEPHAROPLASTY;  Surgeon: Fabio Pierce, MD;  [facility]: [facility];  Service: Ophthalmology;  Laterality: Bilateral;   CARDIAC CATHETERIZATION N/A 04/10/2015   Procedure: Left Heart Cath and Cors/Grafts Angiography;  Surgeon: [name], MD;  Location: [facility] INVASIVE CV LAB;  Service: Cardiovascular;  Laterality: N/A;   CARDIAC CATHETERIZATION N/A 04/10/2015   Procedure: Coronary Stent Intervention;  Surgeon: [name], MD;  Location: [facility] INVASIVE CV LAB;  Service: Cardiovascular;  Laterality: N/A;   CATARACT EXTRACTION W/PHACO Right 06/23/2015   Procedure: CATARACT EXTRACTION PHACO AND INTRAOCULAR LENS PLACEMENT RIGHT EYE; CDE:  7.08;  Surgeon: Gemma Payor, MD;  [facility]: [facility];  Service: Ophthalmology;  Laterality: Right;   CATARACT EXTRACTION W/PHACO Left 08/25/2015   Procedure: CATARACT EXTRACTION PHACO AND INTRAOCULAR LENS PLACEMENT LEFT EYE; CDE:  8.18;  Surgeon: Gemma Payor, MD;  [facility]: [facility];  Service: Ophthalmology;  Laterality: Left;   COLONOSCOPY N/A 12/28/2012   Procedure: COLONOSCOPY;  Surgeon: Malissa Hippo, MD;  [facility];  Service: Endoscopy;  Laterality: N/A;  830 rescheduled   COLONOSCOPY WITH PROPOFOL N/A 01/20/2021   Procedure: COLONOSCOPY WITH  PROPOFOL;  Surgeon: Dolores Frame, MD;  [facility];  Service: Gastroenterology;  Laterality: N/A;  11:05   CORONARY ANGIOPLASTY WITH STENT PLACEMENT  2013; 04/10/2015   "this makes me a total of 7" (04/10/2015)   CORONARY ARTERY BYPASS GRAFT  1997   with (LIMA)   ESOPHAGEAL DILATION N/A 11/26/2015   Procedure: ESOPHAGEAL DILATION;  Surgeon: Malissa Hippo, MD;  [facility];  Service: Endoscopy;  Laterality: N/A;   ESOPHAGEAL DILATION N/A 05/23/2017   Procedure: ESOPHAGEAL DILATION;  Surgeon: Malissa Hippo, MD;  [facility];  Service: Endoscopy;  Laterality: N/A;   ESOPHAGEAL DILATION N/A 06/16/2017   Procedure: ESOPHAGEAL DILATION;  Surgeon: Malissa Hippo, MD;  [facility];  Service: Endoscopy;  Laterality: N/A;  ESOPHAGEAL DILATION  10/28/2021   Procedure: ESOPHAGEAL DILATION;  Surgeon: Dolores Frame, MD;  [facility];  Service: Gastroenterology;;   ESOPHAGOGASTRODUODENOSCOPY N/A 11/26/2015   Procedure: ESOPHAGOGASTRODUODENOSCOPY (EGD);  Surgeon: Malissa Hippo, MD;  [facility];  Service: Endoscopy;  Laterality: N/A;  1:25   ESOPHAGOGASTRODUODENOSCOPY N/A 05/23/2017   Procedure: ESOPHAGOGASTRODUODENOSCOPY (EGD);  Surgeon: Malissa Hippo, MD;  [facility];  Service: Endoscopy;  Laterality: N/A;  1:15   ESOPHAGOGASTRODUODENOSCOPY N/A 06/16/2017   Procedure: ESOPHAGOGASTRODUODENOSCOPY (EGD);  Surgeon: Malissa Hippo, MD;  [facility];  Service: Endoscopy;  Laterality: N/A;  1240   ESOPHAGOGASTRODUODENOSCOPY (EGD) WITH PROPOFOL N/A 01/20/2021   Procedure: ESOPHAGOGASTRODUODENOSCOPY (EGD) WITH PROPOFOL;  Surgeon: Dolores Frame, MD;  [facility];  Service: Gastroenterology;  Laterality: N/A;   ESOPHAGOGASTRODUODENOSCOPY (EGD) WITH PROPOFOL N/A 10/28/2021   Procedure: ESOPHAGOGASTRODUODENOSCOPY (EGD) WITH PROPOFOL;  Surgeon: Dolores Frame, MD;  [facility];  Service: Gastroenterology;  Laterality: N/A;  215 ASA 2, pt will arrive at 10:45   GANGLION CYST EXCISION Right 01/25/2023   Procedure: REMOVAL GANGLION OF RIGHT WRIST;  Surgeon: Betha Loa, MD;  Location: [facility];  Service: Orthopedics;  Laterality: Right;   HERNIA REPAIR Left    IR IMAGING GUIDED PORT INSERTION  02/24/2022   KNEE CARTILAGE SURGERY Bilateral    LEFT HEART CATH AND CORS/GRAFTS ANGIOGRAPHY N/A 09/08/2016   Procedure: LEFT HEART CATH AND CORS/GRAFTS ANGIOGRAPHY;  Surgeon: Swaziland, Peter M, MD;  Location: [facility] INVASIVE CV LAB;  Service: Cardiovascular;  Laterality: N/A;   LEFT HEART CATHETERIZATION WITH CORONARY ANGIOGRAM N/A 09/08/2011   Procedure: LEFT HEART CATHETERIZATION WITH CORONARY ANGIOGRAM;  Surgeon: [name], MD;  Location: [facility] CATH LAB;  Service: Cardiovascular;  Laterality: N/A;   PERCUTANEOUS CORONARY STENT INTERVENTION (PCI-S) Right 09/08/2011   Procedure: PERCUTANEOUS CORONARY STENT INTERVENTION (PCI-S);  Surgeon: [name], MD;  Location: [facility] CATH LAB;  Service: Cardiovascular;  Laterality: Right;   POLYPECTOMY  01/20/2021   Procedure: POLYPECTOMY;  Surgeon: Dolores Frame, MD;  [facility];  Service: Gastroenterology;;   ROBOT ASSISTED LAPAROSCOPIC RADICAL PROSTATECTOMY      FAMILY HISTORY: Family History  Problem Relation Age of Onset   Stroke Mother 23       died   Aneurysm Mother    Cancer Father 88       died    SOCIAL HISTORY: Social History   Socioeconomic History   Marital status: Widowed    Spouse name: Not on file   Number of children: Not on file   Years of education: Not on file   Highest education level: Not on file  Occupational History   Occupation: semi-retired  Tobacco Use   Smoking status: Former    Types: Cigars    Passive exposure: Past   Smokeless tobacco: Never   Tobacco comments:    "quit smoking in ~ 1978"  Vaping Use   Vaping  status: Never Used  Substance and Sexual Activity   Alcohol use: Yes    Alcohol/week: 2.0 standard drinks of alcohol    Types: 2 Cans of beer per week    Comment: occasionally    Drug use: No   Sexual activity: Not Currently  Other Topics Concern   Not on file  Social History Narrative   Pt lives alone    Manages rental property    Social Drivers of Health   Financial Resource Strain:  Low Risk  (08/10/2022)   Overall Financial Resource Strain (CARDIA)    Difficulty of Paying Living Expenses: Not hard at all  Food Insecurity: Low Risk  (12/01/2022)   Received from [facility]   Hunger Vital Sign    Worried About Running Out of Food in the Last Year: Never true    Ran Out of Food in the Last Year: Never true  Transportation Needs: No Transportation Needs (12/01/2022)   Received from [facility]    In the past 12 months, has lack of reliable transportation kept you from medical appointments, meetings, work or from getting things needed for daily living? : No  Physical Activity: Sufficiently Active (06/04/2022)   Exercise Vital Sign    Days of Exercise per Week: 5 days    Minutes of Exercise per Session: 30 min  Stress: Not on file  Social Connections: Not on file  Intimate Partner Violence: Not At Risk (02/04/2022)   Humiliation, Afraid, Rape, and Kick questionnaire    Fear of Current or Ex-Partner: No    Emotionally Abused: No    Physically Abused: No    Sexually Abused: No     PHYSICAL EXAM  GENERAL EXAM/CONSTITUTIONAL: Vitals:  Vitals:   03/15/23 1144  BP: 138/72  Pulse: 62  Weight: 200 lb (90.7 kg)  Height: 6\' 1"  (1.854 m)   Body mass index is 26.39 kg/m. Wt Readings from Last 3 Encounters:  03/15/23 200 lb (90.7 kg)  03/11/23 200 lb (90.7 kg)  03/04/23 203 lb (92.1 kg)   Patient is in no distress; well developed, nourished and groomed; neck is supple  CARDIOVASCULAR: Examination of carotid arteries is normal; no carotid  bruits Regular rate and rhythm, no murmurs Examination of peripheral vascular system by observation and palpation is normal  EYES: Ophthalmoscopic exam of optic discs and posterior segments is normal; no papilledema or hemorrhages Vision Screening   Right eye Left eye Both eyes  Without correction 20/30 20/30 20/50   With correction 20/40 20/70 20/40     MUSCULOSKELETAL: Gait, strength, tone, movements noted in Neurologic exam below  NEUROLOGIC: MENTAL STATUS:      No data to display         awake, alert, oriented to person, place and time recent and remote memory intact normal attention and concentration language fluent, comprehension intact, naming intact fund of knowledge appropriate  CRANIAL NERVE:  2nd - no papilledema on fundoscopic exam 2nd, 3rd, 4th, 6th - pupils equal and reactive to light, visual fields full to confrontation, extraocular muscles intact, no nystagmus; DOUBLE VISION ON RIGHT GAZE; FLUCTUATING PTOSIS; WORSE AFTER 1 MINUTE UPGAZE 5th - facial sensation symmetric 7th - facial strength symmetric 8th - hearing intact 9th - palate elevates symmetrically, uvula midline 11th - shoulder shrug symmetric 12th - tongue protrusion midline NASAL SPEECH; MILD DYSARTHRIA ABLE TO COUNT TO 40 IN 1 BREATH  MOTOR:  normal bulk and tone, full strength in the BUE, BLE; EXCEPT DELTOIDS 4+; GRIP 4 BILATERAL HIP FLEXION 3-4; OTHERWISE 5  SENSORY:  normal and symmetric to light touch  COORDINATION:  finger-nose-finger, fine finger movements normal  REFLEXES:  deep tendon reflexes TRACE and symmetric  GAIT/STATION:  narrow based gait     DIAGNOSTIC DATA (LABS, IMAGING, TESTING) - I reviewed patient records, labs, notes, testing and imaging myself where available.  Lab Results  Component Value Date   WBC 3.9 (L) 03/11/2023   HGB 9.3 (L) 03/11/2023   HCT 28.9 (L) 03/11/2023  MCV 105.5 (H) 03/11/2023   PLT 197 03/11/2023      Component Value Date/Time    NA 138 03/11/2023 1328   NA 145 (H) 11/11/2021 0000   K 3.9 03/11/2023 1328   CL 107 03/11/2023 1328   CO2 27 03/11/2023 1328   GLUCOSE 100 (H) 03/11/2023 1328   BUN 26 (H) 03/11/2023 1328   BUN 19 11/11/2021 0000   CREATININE 1.47 (H) 03/11/2023 1328   CREATININE 1.69 (H) 12/14/2021 1124   CALCIUM 8.7 (L) 03/11/2023 1328   PROT 6.7 03/11/2023 1328   PROT 6.9 10/06/2021 1542   ALBUMIN 3.6 03/11/2023 1328   ALBUMIN 4.5 10/06/2021 1542   AST 18 03/11/2023 1328   ALT 17 03/11/2023 1328   ALKPHOS 40 03/11/2023 1328   BILITOT 0.4 03/11/2023 1328   BILITOT 0.3 10/06/2021 1542   GFRNONAA 48 (L) 03/11/2023 1328   GFRAA >60 09/08/2016 1956   Lab Results  Component Value Date   CHOL 121 10/06/2021   HDL 36 (L) 10/06/2021   LDLCALC 49 10/06/2021   TRIG 226 (H) 10/06/2021   CHOLHDL 3.4 10/06/2021   No results found for: "HGBA1C" Lab Results  Component Value Date   VITAMINB12 189 02/17/2023   Lab Results  Component Value Date   TSH 2.888 02/17/2023    03/11/23 CTA head / neck 1. Abnormal soft tissue along the wall of the V1 segment left vertebral artery with multifocal irregularity of the vessel from the V1 segment to the V3 segment. Focal occlusion of the distal V3 segment just prior to entering the dura. Findings concerning for vertebral artery dissection. 2. Severe (70% or greater) stenosis of the right carotid bifurcation. 3. Moderate (50-69%) stenosis of the left carotid bifurcation. 4. Moderate stenosis of the cavernous and ophthalmic right ICA. 5. No intracranial large vessel occlusion. 6. No acute intracranial abnormality. Mild chronic microvascular ischemic changes. 7. Prominent anterior endplate osteophytes in the upper thoracic spine which likely compress the esophagus. Associated patulous appearance of the cervical esophagus above this level.    ASSESSMENT AND PLAN  78 y.o. year old male here with:   Dx:  1. Ptosis of both eyelids   2. Double  vision with both eyes open   3. Slurred speech     PLAN:  FLUCTUATING, FATIGABLE PTOSIS, DOUBLE VISION, DYSARTHRIA, ARM AND LEG WEAKNESS (since ~Jan 2025) - signs and symptoms highly suspicious for neuromuscular junction disorder such as myasthenia gravis; follow up myasthenia ab testing  - pyridostigmine 30mg  twice a day 1-2 WEEKS; then increase to 60mg  twice a day or three times a day as tolerated  LEFT VERTEBRAL ARTERY DISSECTION (appears to be incidental finding as fluctuating fatigable symptoms not consistent with this localization) - continue aspirin 81 + plavix 75mg  daily x 3-6 months; repeat CTA head / neck   Orders Placed This Encounter  Procedures   AChR Abs with Reflex to MuSK   Meds ordered this encounter  Medications   pyridostigmine (MESTINON) 60 MG tablet    Sig: Take 0.5-1 tablets (30-60 mg total) by mouth 3 (three) times daily.    Dispense:  60 tablet    Refill:  6   Return in about 6 weeks (around 04/26/2023).    Suanne Marker, MD 03/15/2023, 5:22 PM Certified in Neurology, Neurophysiology and Neuroimaging  [facility] [address], [address] [place], [place] [address] [phone]

## 2023-03-15 NOTE — Patient Instructions (Signed)
-   start pyridostigmine 30mg  twice a day; then increase to 60mg  twice a day or three times a day as tolerated

## 2023-03-16 DIAGNOSIS — I7774 Dissection of vertebral artery: Secondary | ICD-10-CM | POA: Diagnosis not present

## 2023-03-18 LAB — REFLEX TEST INFORMATION

## 2023-03-18 LAB — ACHR ABS WITH REFLEX TO MUSK: AChR Binding Ab, Serum: 70.1 nmol/L — ABNORMAL HIGH (ref 0.00–0.24)

## 2023-03-21 NOTE — Progress Notes (Signed)
 Labs consistent with myasthenia gravis. Continue pyridostigmine as directed. -VRP

## 2023-03-23 ENCOUNTER — Other Ambulatory Visit: Payer: Medicare Other | Admitting: Urology

## 2023-03-30 ENCOUNTER — Ambulatory Visit: Admitting: Urology

## 2023-03-30 VITALS — BP 129/65 | HR 62

## 2023-03-30 DIAGNOSIS — N3001 Acute cystitis with hematuria: Secondary | ICD-10-CM | POA: Diagnosis not present

## 2023-03-30 DIAGNOSIS — Z8551 Personal history of malignant neoplasm of bladder: Secondary | ICD-10-CM | POA: Diagnosis not present

## 2023-03-30 DIAGNOSIS — C679 Malignant neoplasm of bladder, unspecified: Secondary | ICD-10-CM | POA: Diagnosis not present

## 2023-03-30 DIAGNOSIS — R82998 Other abnormal findings in urine: Secondary | ICD-10-CM

## 2023-03-30 LAB — URINALYSIS, ROUTINE W REFLEX MICROSCOPIC
Specific Gravity, UA: 1.025 (ref 1.005–1.030)
pH, UA: 5 (ref 5.0–7.5)

## 2023-03-30 MED ORDER — CIPROFLOXACIN HCL 500 MG PO TABS: 500.0000 mg | ORAL_TABLET | Freq: Once | ORAL | Status: DC

## 2023-03-30 MED ORDER — NITROFURANTOIN MONOHYD MACRO 100 MG PO CAPS: 100.0000 mg | ORAL_CAPSULE | Freq: Two times a day (BID) | ORAL | 0 refills | Status: DC

## 2023-03-30 NOTE — Progress Notes (Signed)
 03/30/2023 2:51 PM   [name] June 23, 1945 [id number]  Referring provider: Assunta Found, MD [address] [place],  [place] [address]  dysuria   HPI: Mr Vanvoorhis is a 78yo here for followup fro bladder cancer. UA today is concerning for infection. He has increased urinary urgency, frequency and dysuira for the past 10 days. No gross hematuria    PMH: Past Medical History:  Diagnosis Date   Anginal pain (HCC)    Arthritis    "all over"   Bladder cancer [facility])    chemo/radiation   Bladder tumor    Chronic lower back pain    Coronary artery disease    a. s/p CABG x 2 (LIMA->LAD, RIMA->RCA);  b. s/p multiple PCI's to Ramus;  c. 08/2011 Cath/PCI: LM 70% into ramus with 70-80% there->treated wtih 3.5x18 Xience Xpedition DES, LCX  nonobs, RCA occluded.  RIMA & LIMA patent, EF 55-65%   DJD (degenerative joint disease)    GERD (gastroesophageal reflux disease)    History of gout    Hyperlipidemia    Hypertension    Hypothyroidism    Neuropathy    OSA on CPAP    Prostate cancer [facility])     Surgical History: Past Surgical History:  Procedure Laterality Date   ABDOMINAL HERNIA REPAIR     BIOPSY  11/26/2015   Procedure: BIOPSY;  Surgeon: Malissa Hippo, MD;  [facility];  Service: Endoscopy;;  gastric esophagus   BIOPSY  01/20/2021   Procedure: BIOPSY;  Surgeon: Dolores Frame, MD;  [facility];  Service: Gastroenterology;;   BIOPSY  10/28/2021   Procedure: BIOPSY;  Surgeon: Dolores Frame, MD;  [facility];  Service: Gastroenterology;;   BLADDER TUMOR EXCISION     BROW LIFT Bilateral 04/27/2019   Procedure: BILATERAL BLEPHAROPLASTY;  Surgeon: Fabio Pierce, MD;  Location: [facility];  Service: Ophthalmology;  Laterality: Bilateral;   CARDIAC CATHETERIZATION N/A 04/10/2015   Procedure: Left Heart Cath and Cors/Grafts Angiography;  Surgeon: [name], MD;  Location: [facility] INVASIVE CV LAB;  Service:  Cardiovascular;  Laterality: N/A;   CARDIAC CATHETERIZATION N/A 04/10/2015   Procedure: Coronary Stent Intervention;  Surgeon: [name], MD;  Location: [facility] INVASIVE CV LAB;  Service: Cardiovascular;  Laterality: N/A;   CATARACT EXTRACTION W/PHACO Right 06/23/2015   Procedure: CATARACT EXTRACTION PHACO AND INTRAOCULAR LENS PLACEMENT RIGHT EYE; CDE:  7.08;  Surgeon: Gemma Payor, MD;  [facility];  Service: Ophthalmology;  Laterality: Right;   CATARACT EXTRACTION W/PHACO Left 08/25/2015   Procedure: CATARACT EXTRACTION PHACO AND INTRAOCULAR LENS PLACEMENT LEFT EYE; CDE:  8.18;  Surgeon: Gemma Payor, MD;  Location: [facility];  Service: Ophthalmology;  Laterality: Left;   COLONOSCOPY N/A 12/28/2012   Procedure: COLONOSCOPY;  Surgeon: Malissa Hippo, MD;  [facility];  Service: Endoscopy;  Laterality: N/A;  830 rescheduled   COLONOSCOPY WITH PROPOFOL N/A 01/20/2021   Procedure: COLONOSCOPY WITH PROPOFOL;  Surgeon: Dolores Frame, MD;  [facility];  Service: Gastroenterology;  Laterality: N/A;  11:05   CORONARY ANGIOPLASTY WITH STENT PLACEMENT  2013; 04/10/2015   "this makes me a total of 7" (04/10/2015)   CORONARY ARTERY BYPASS GRAFT  1997   with (LIMA)   ESOPHAGEAL DILATION N/A 11/26/2015   Procedure: ESOPHAGEAL DILATION;  Surgeon: Malissa Hippo, MD;  [facility];  Service: Endoscopy;  Laterality: N/A;   ESOPHAGEAL DILATION N/A 05/23/2017   Procedure: ESOPHAGEAL DILATION;  Surgeon: Karilyn Cota,  Joline Maxcy, MD;  [facility];  Service: Endoscopy;  Laterality: N/A;   ESOPHAGEAL DILATION N/A 06/16/2017   Procedure: ESOPHAGEAL DILATION;  Surgeon: Malissa Hippo, MD;  [facility];  Service: Endoscopy;  Laterality: N/A;   ESOPHAGEAL DILATION  10/28/2021   Procedure: ESOPHAGEAL DILATION;  Surgeon: Dolores Frame, MD;  [facility];  Service: Gastroenterology;;   ESOPHAGOGASTRODUODENOSCOPY N/A 11/26/2015    Procedure: ESOPHAGOGASTRODUODENOSCOPY (EGD);  Surgeon: Malissa Hippo, MD;  [facility];  Service: Endoscopy;  Laterality: N/A;  1:25   ESOPHAGOGASTRODUODENOSCOPY N/A 05/23/2017   Procedure: ESOPHAGOGASTRODUODENOSCOPY (EGD);  Surgeon: Malissa Hippo, MD;  [facility];  Service: Endoscopy;  Laterality: N/A;  1:15   ESOPHAGOGASTRODUODENOSCOPY N/A 06/16/2017   Procedure: ESOPHAGOGASTRODUODENOSCOPY (EGD);  Surgeon: Malissa Hippo, MD;  [facility];  Service: Endoscopy;  Laterality: N/A;  1240   ESOPHAGOGASTRODUODENOSCOPY (EGD) WITH PROPOFOL N/A 01/20/2021   Procedure: ESOPHAGOGASTRODUODENOSCOPY (EGD) WITH PROPOFOL;  Surgeon: Dolores Frame, MD;  [facility];  Service: Gastroenterology;  Laterality: N/A;   ESOPHAGOGASTRODUODENOSCOPY (EGD) WITH PROPOFOL N/A 10/28/2021   Procedure: ESOPHAGOGASTRODUODENOSCOPY (EGD) WITH PROPOFOL;  Surgeon: Dolores Frame, MD;  [facility];  Service: Gastroenterology;  Laterality: N/A;  215 ASA 2, pt will arrive at 10:45   GANGLION CYST EXCISION Right 01/25/2023   Procedure: REMOVAL GANGLION OF RIGHT WRIST;  Surgeon: Betha Loa, MD;  Location: [facility];  Service: Orthopedics;  Laterality: Right;   HERNIA REPAIR Left    IR IMAGING GUIDED PORT INSERTION  02/24/2022   KNEE CARTILAGE SURGERY Bilateral    LEFT HEART CATH AND CORS/GRAFTS ANGIOGRAPHY N/A 09/08/2016   Procedure: LEFT HEART CATH AND CORS/GRAFTS ANGIOGRAPHY;  Surgeon: Swazil[place], Peter M, MD;  Location: [facility] INVASIVE CV LAB;  Service: Cardiovascular;  Laterality: N/A;   LEFT HEART CATHETERIZATION WITH CORONARY ANGIOGRAM N/A 09/08/2011   Procedure: LEFT HEART CATHETERIZATION WITH CORONARY ANGIOGRAM;  Surgeon: [name], MD;  Location[facility] CATH LAB;  Service: Cardiovascular;  Laterality: N/A;   PERCUTANEOUS CORONARY STENT INTERVENTION (PCI-S) Right 09/08/2011   Procedure: PERCUTANEOUS CORONARY STENT INTERVENTION  (PCI-S);  Surgeon: [name], MD;  Location: [facility] CATH LAB;  Service: Cardiovascular;  Laterality: Right;   POLYPECTOMY  01/20/2021   Procedure: POLYPECTOMY;  Surgeon: Dolores Frame, MD;  [facility];  Service: Gastroenterology;;   ROBOT ASSISTED LAPAROSCOPIC RADICAL PROSTATECTOMY      Home Medications:  Allergies as of 03/30/2023       Reactions   Codeine Nausea Only   Gemtesa [vibegron] Other (See Comments)   Patient reports swelling of throat, lips and dizziness when taking this medication   Pollen Extract Other (See Comments)   Seasonal        Medication List        Accurate as of March 30, 2023  2:51 PM. If you have any questions, ask your nurse or doctor.          acetaminophen 500 MG tablet Commonly known as: TYLENOL Take 500 mg by mouth every 6 (six) hours as needed for moderate pain (pain score 4-6).   amLODipine 10 MG tablet Commonly known as: NORVASC Take 10 mg by mouth in the morning.   aspirin EC 81 MG tablet Take 81 mg by mouth every evening.   atorvastatin 40 MG tablet Commonly known as: LIPITOR TAKE 1/2 TABLET(20 MG) BY MOUTH EVERY EVENING   carvedilol 6.25 MG tablet Commonly known  as: COREG TAKE 1 TABLET(6.25 MG) BY MOUTH TWICE DAILY   cetirizine 10 MG tablet Commonly known as: ZYRTEC Take 10 mg by mouth every evening.   clopidogrel 75 MG tablet Commonly known as: PLAVIX Take 1 tablet (75 mg total) by mouth in the morning.   Coenzyme Q10 300 MG Caps Take 300 mg by mouth every evening.   fenofibrate 160 MG tablet Take 160 mg by mouth daily.   gabapentin 600 MG tablet Commonly known as: NEURONTIN Take 600 mg by mouth 3 (three) times daily.   GLUCOSAMINE-CHONDROITIN PO Take 1 tablet by mouth every evening.   isosorbide mononitrate 60 MG 24 hr tablet Commonly known as: IMDUR TAKE 1 AND 1/2 TABLETS(90 MG) BY MOUTH DAILY   levothyroxine 175 MCG tablet Commonly known as: SYNTHROID Take 175 mcg by mouth  daily.   Lubricant Eye Drops 0.4-0.3 % Soln Generic drug: Polyethyl Glycol-Propyl Glycol Place 1-2 drops into both eyes 3 (three) times daily as needed (dry/irritated eyes.).   meclizine 25 MG tablet Commonly known as: ANTIVERT Take 1 tablet (25 mg total) by mouth 2 (two) times daily as needed for dizziness.   nitroGLYCERIN 0.4 MG SL tablet Commonly known as: NITROSTAT Place 1 tablet (0.4 mg total) under the tongue every 5 (five) minutes as needed for chest pain.   pyridostigmine 60 MG tablet Commonly known as: MESTINON Take 0.5-1 tablets (30-60 mg total) by mouth 3 (three) times daily.   traMADol 50 MG tablet Commonly known as: Ultram Take 1 tablet (50 mg total) by mouth every 6 (six) hours as needed.        Allergies:  Allergies  Allergen Reactions   Codeine Nausea Only   Gemtesa [Vibegron] Other (See Comments)    Patient reports swelling of throat, lips and dizziness when taking this medication   Pollen Extract Other (See Comments)    Seasonal    Family History: Family History  Problem Relation Age of Onset   Stroke Mother 44       died   Aneurysm Mother    Cancer Father 50       died    Social History:  reports that he has quit smoking. His smoking use included cigars. He has been exposed to tobacco smoke. He has never used smokeless tobacco. He reports current alcohol use of about 2.0 standard drinks of alcohol per week. He reports that he does not use drugs.  ROS: All other review of systems were reviewed and are negative except what is noted above in HPI  Physical Exam: BP 129/65   Pulse 62   Constitutional:  Alert and oriented, No acute distress. HEENT: [place] AT, moist mucus membranes.  Trachea midline, no masses. Cardiovascular: No clubbing, cyanosis, or edema. Respiratory: Normal respiratory effort, no increased work of breathing. GI: Abdomen is soft, nontender, nondistended, no abdominal masses GU: No CVA tenderness.  Lymph: No cervical or inguinal  lymphadenopathy. Skin: No rashes, bruises or suspicious lesions. Neurologic: Grossly intact, no focal deficits, moving all 4 extremities. Psychiatric: Normal mood and affect.  Laboratory Data: Lab Results  Component Value Date   WBC 3.9 (L) 03/11/2023   HGB 9.3 (L) 03/11/2023   HCT 28.9 (L) 03/11/2023   MCV 105.5 (H) 03/11/2023   PLT 197 03/11/2023    Lab Results  Component Value Date   CREATININE 1.47 (H) 03/11/2023    No results found for: "PSA"  No results found for: "TESTOSTERONE"  No results found for: "HGBA1C"  Urinalysis  Component Value Date/Time   COLORURINE RED (A) 06/17/2020 0112   APPEARANCEUR Clear 12/20/2022 0945   LABSPEC  06/17/2020 0112    TEST NOT REPORTED DUE TO COLOR INTERFERENCE OF URINE PIGMENT   PHURINE  06/17/2020 0112    TEST NOT REPORTED DUE TO COLOR INTERFERENCE OF URINE PIGMENT   GLUCOSEU Negative 12/20/2022 0945   HGBUR (A) 06/17/2020 0112    TEST NOT REPORTED DUE TO COLOR INTERFERENCE OF URINE PIGMENT   BILIRUBINUR Negative 12/20/2022 0945   KETONESUR (A) 06/17/2020 0112    TEST NOT REPORTED DUE TO COLOR INTERFERENCE OF URINE PIGMENT   PROTEINUR 1+ (A) 12/20/2022 0945   PROTEINUR (A) 06/17/2020 0112    TEST NOT REPORTED DUE TO COLOR INTERFERENCE OF URINE PIGMENT   NITRITE Negative 12/20/2022 0945   NITRITE (A) 06/17/2020 0112    TEST NOT REPORTED DUE TO COLOR INTERFERENCE OF URINE PIGMENT   LEUKOCYTESUR Trace (A) 12/20/2022 0945   LEUKOCYTESUR (A) 06/17/2020 0112    TEST NOT REPORTED DUE TO COLOR INTERFERENCE OF URINE PIGMENT    Lab Results  Component Value Date   LABMICR See below: 12/20/2022   WBCUA 6-10 (A) 12/20/2022   LABEPIT 0-10 12/20/2022   MUCUS Present (A) 08/04/2022   BACTERIA None seen 12/20/2022    Pertinent Imaging:  No results found for this or any previous visit.  No results found for this or any previous visit.  No results found for this or any previous visit.  No results found for this or any  previous visit.  No results found for this or any previous visit.  No results found for this or any previous visit.  No results found for this or any previous visit.  No results found for this or any previous visit.   Assessment & Plan:    1. Acute cystitis with hematuria Urine for culture Macrobid 100mg  Bid for 7 days Followup 1 week for cystoscopy - Urinalysis, Routine w reflex microscopic   No follow-ups on file.  Wilkie Aye, MD  [facility] [place]

## 2023-04-01 LAB — URINE CULTURE

## 2023-04-04 ENCOUNTER — Telehealth: Payer: Self-pay

## 2023-04-04 MED ORDER — SULFAMETHOXAZOLE-TRIMETHOPRIM 800-160 MG PO TABS: 1.0000 | ORAL_TABLET | Freq: Two times a day (BID) | ORAL | 0 refills | Status: DC

## 2023-04-04 NOTE — Telephone Encounter (Signed)
 Detailed message left.  My-chart message sent.

## 2023-04-05 ENCOUNTER — Other Ambulatory Visit: Payer: Self-pay | Admitting: Hematology

## 2023-04-05 ENCOUNTER — Encounter: Payer: Self-pay | Admitting: Urology

## 2023-04-05 NOTE — Patient Instructions (Signed)
 Urinary Tract Infection, Male A urinary tract infection (UTI) is an infection in your urinary tract. The urinary tract is made up of the organs that make, store, and get rid of pee (urine) in your body. These organs include: The kidneys. The ureters. The bladder. The urethra. What are the causes? Most UTIs are caused by germs called bacteria. They may be in or near your genitals. These germs grow and cause swelling in your urinary tract. What increases the risk? You're more likely to get a UTI if: You have a soft tube called a catheter that drains your pee. You can't control when you pee or poop. You have trouble peeing because of: A prostate that's bigger than normal. A kidney stone. A urinary blockage. A nerve condition that affects your bladder. Not getting enough to drink. You're sexually active. You're an older adult. You're also more likely to get a UTI if you have other health problems, such as: Diabetes. A weak immune system. Your immune system is your body's defense system. Sickle cell disease. Injury of the spine. What are the signs or symptoms? Symptoms may include: Needing to pee right away. Peeing small amounts often. Trouble getting the stream started. Pain or burning when you pee. Blood in your pee. Pee that smells bad or odd. Pain in your belly or lower back. You may also: Feel confused. This may be the first symptom in older adults. Vomit. Not feel hungry. Feel tired or easily annoyed. Have a fever or chills. How is this diagnosed? A UTI is diagnosed based on your medical history and an exam. You may also have other tests. These may include: Pee tests. Blood tests. Tests for sexually transmitted infections (STIs). If you've had more than one UTI, you may need to have imaging studies done to find out why you keep getting them. How is this treated? A UTI can be treated by: Taking antibiotics or other medicines. Drinking enough fluid to keep your pee  pale yellow. In rare cases, a UTI can cause a very bad condition called sepsis. Sepsis may be treated in the hospital. Follow these instructions at home: Medicines Take your medicines only as told by your health care provider. If you were given antibiotics, take them as told by your provider. Do not stop taking them even if you start to feel better. General instructions Make sure you: Pee often and fully. Do not hold your pee for a long time. Pee after you have sex. Contact a health care provider if: Your symptoms don't get better after 1-2 days of taking antibiotics. Your symptoms go away and then come back. You have a fever or chills. You vomit or feel like you may vomit. Get help right away if: You have very bad pain in your back or lower belly. You faint. This information is not intended to replace advice given to you by your health care provider. Make sure you discuss any questions you have with your health care provider. Document Revised: 04/02/2022 Document Reviewed: 04/02/2022 Elsevier Patient Education  2024 [employer].

## 2023-04-06 ENCOUNTER — Other Ambulatory Visit: Admitting: Urology

## 2023-04-13 ENCOUNTER — Ambulatory Visit: Admitting: Urology

## 2023-04-13 VITALS — BP 157/63 | HR 71

## 2023-04-13 DIAGNOSIS — Z8550 Personal history of malignant neoplasm of unspecified urinary tract organ: Secondary | ICD-10-CM | POA: Diagnosis not present

## 2023-04-13 DIAGNOSIS — C679 Malignant neoplasm of bladder, unspecified: Secondary | ICD-10-CM | POA: Diagnosis not present

## 2023-04-13 DIAGNOSIS — Z8551 Personal history of malignant neoplasm of bladder: Secondary | ICD-10-CM | POA: Diagnosis not present

## 2023-04-13 DIAGNOSIS — N3289 Other specified disorders of bladder: Secondary | ICD-10-CM | POA: Diagnosis not present

## 2023-04-13 LAB — URINALYSIS, ROUTINE W REFLEX MICROSCOPIC
Bilirubin, UA: NEGATIVE
Glucose, UA: NEGATIVE
Ketones, UA: NEGATIVE
Leukocytes,UA: NEGATIVE
Nitrite, UA: NEGATIVE
Protein,UA: NEGATIVE
Specific Gravity, UA: 1.025 (ref 1.005–1.030)
Urobilinogen, Ur: 0.2 mg/dL (ref 0.2–1.0)
pH, UA: 6 (ref 5.0–7.5)

## 2023-04-13 LAB — MICROSCOPIC EXAMINATION

## 2023-04-13 MED ORDER — CIPROFLOXACIN HCL 500 MG PO TABS
500.0000 mg | ORAL_TABLET | Freq: Once | ORAL | Status: AC
Start: 2023-04-13 — End: 2023-04-13
  Administered 2023-04-13: 500 mg via ORAL

## 2023-04-13 NOTE — Progress Notes (Signed)
   04/13/23  CC: bladder cancer   HPI: [name] is a 78yo here for cystoscopy for bladder cancer Blood pressure (!) 157/63, pulse 71. NED. A&Ox3.   No respiratory distress   Abd soft, NT, ND Normal phallus with bilateral descended testicles  Cystoscopy Procedure Note  Patient identification was confirmed, informed consent was obtained, and patient was prepped using Betadine solution.  Lidocaine jelly was administered per urethral meatus.     Pre-Procedure: - Inspection reveals a normal caliber ureteral meatus.  Procedure: The flexible cystoscope was introduced without difficulty - No urethral strictures/lesions are present. - Normal prostate  - Normal bladder neck - Bilateral ureteral orifices identified - Bladder mucosa  reveals no ulcers, tumors, or lesions - No bladder stones - No trabeculation     Post-Procedure: - Patient tolerated the procedure well  Assessment/ Plan: Urine for cytology. Followup 3 months for cystoscopy  No follow-ups on file.  Wilkie Aye, MD

## 2023-04-13 NOTE — Progress Notes (Signed)
 Urine sent for [facility] cytology- tracking # [phone]

## 2023-04-21 ENCOUNTER — Encounter: Payer: Self-pay | Admitting: Urology

## 2023-04-21 NOTE — Patient Instructions (Signed)

## 2023-04-25 ENCOUNTER — Encounter: Payer: Self-pay | Admitting: Urology

## 2023-05-05 ENCOUNTER — Ambulatory Visit: Admitting: Diagnostic Neuroimaging

## 2023-05-05 ENCOUNTER — Encounter: Payer: Self-pay | Admitting: Diagnostic Neuroimaging

## 2023-05-05 VITALS — BP 133/61 | HR 56 | Ht 73.0 in | Wt 207.0 lb

## 2023-05-05 DIAGNOSIS — G7000 Myasthenia gravis without (acute) exacerbation: Secondary | ICD-10-CM

## 2023-05-05 MED ORDER — PREDNISONE 20 MG PO TABS: 20.0000 mg | ORAL_TABLET | Freq: Every day | ORAL | 3 refills | Status: DC

## 2023-05-05 NOTE — Patient Instructions (Signed)
-   start prednisone  20mg  daily   - start daily prilosec / protonix   - start calcium  (1500 mg per day) and vitamin D  (400 to 800 international units per day)

## 2023-05-05 NOTE — Progress Notes (Signed)
 [facility]  PATIENT: [name] DOB: 11-10-1945  REFERRING CLINICIAN: Minus Amel, MD HISTORY FROM: patient and daughter REASON FOR VISIT: follow up   HISTORICAL  CHIEF COMPLAINT:  Chief Complaint  Patient presents with   MG    Rm 7 with daughter Pt is well and stable, symptoms have not improved but no new concerns.     HISTORY OF PRESENT ILLNESS:   UPDATE (05/09/23, VRP): Since last visit, symptoms are stable, but not improved. Still with double vision and drooping vision. Some mild issues with arms and legs.   PRIOR HPI (03/15/23): 78 year old male here for evaluation of double vision and slurred speech.  Patient presented to hospital on 03/11/2023 for several months of intermittent double vision, lip swelling, numb feeling, tongue swelling, gait difficulty, slurred speech, fuzzy thoughts, 30 pound weight loss.  Patient had MRI of the brain which was unremarkable.  CTA of the head and neck showed possible left vertebral artery dissection extending from V1-V3 segment.  Also found to have other bilateral carotid bifurcation stenoses.  He was started on aspirin  and Plavix  and recommended to follow-up in neurology clinic.  Myasthenia gravis antibody testing was sent off but results not back yet.    REVIEW OF SYSTEMS: Full 14 system review of systems performed and negative with exception of: as per HPI.  ALLERGIES: Allergies  Allergen Reactions   Codeine Nausea Only   Gemtesa  [Vibegron ] Other (See Comments)    Patient reports swelling of throat, lips and dizziness when taking this medication   Pollen Extract Other (See Comments)    Seasonal    HOME MEDICATIONS: Outpatient Medications Prior to Visit  Medication Sig Dispense Refill   acetaminophen  (TYLENOL ) 500 MG tablet Take 500 mg by mouth every 6 (six) hours as needed for moderate pain (pain score 4-6).     amLODipine  (NORVASC ) 10 MG tablet Take 10 mg by mouth in the morning.     aspirin  EC 81  MG tablet Take 81 mg by mouth every evening. 30 tablet    atorvastatin  (LIPITOR) 40 MG tablet TAKE 1/2 TABLET(20 MG) BY MOUTH EVERY EVENING 45 tablet 3   B Complex Vitamins (B COMPLEX PO) Take by mouth.     carvedilol  (COREG ) 6.25 MG tablet TAKE 1 TABLET(6.25 MG) BY MOUTH TWICE DAILY 180 tablet 1   cetirizine (ZYRTEC) 10 MG tablet Take 10 mg by mouth every evening.     clopidogrel  (PLAVIX ) 75 MG tablet Take 1 tablet (75 mg total) by mouth in the morning. 90 tablet 3   Coenzyme Q10 300 MG CAPS Take 300 mg by mouth every evening.     cyanocobalamin  (VITAMIN B12) 1000 MCG/ML injection ADMINISTER 1 ML(1000 MCG) IN THE MUSCLE EVERY 21 DAYS FOR 1 DOSE 1 mL 0   fenofibrate  160 MG tablet Take 160 mg by mouth daily.     gabapentin  (NEURONTIN ) 600 MG tablet Take 600 mg by mouth 3 (three) times daily.  3   GLUCOSAMINE-CHONDROITIN PO Take 1 tablet by mouth every evening.     isosorbide  mononitrate (IMDUR ) 60 MG 24 hr tablet TAKE 1 AND 1/2 TABLETS(90 MG) BY MOUTH DAILY 45 tablet 7   levothyroxine  (SYNTHROID ) 175 MCG tablet Take 175 mcg by mouth daily.     Multiple Vitamins-Minerals (VITRIUM 50+ ADULT-MULTI PO) Take by mouth.     nitroGLYCERIN  (NITROSTAT ) 0.4 MG SL tablet Place 1 tablet (0.4 mg total) under the tongue every 5 (five) minutes as needed for chest pain. 25 tablet  11   Omega-3 Fatty Acids (OMEGA-3 FISH OIL PO) Take by mouth.     Polyethyl Glycol-Propyl Glycol (LUBRICANT EYE DROPS) 0.4-0.3 % SOLN Place 1-2 drops into both eyes 3 (three) times daily as needed (dry/irritated eyes.).     pyridostigmine  (MESTINON ) 60 MG tablet Take 0.5-1 tablets (30-60 mg total) by mouth 3 (three) times daily. 60 tablet 6   traMADol  (ULTRAM ) 50 MG tablet Take 1 tablet (50 mg total) by mouth every 6 (six) hours as needed. 20 tablet 0   meclizine  (ANTIVERT ) 25 MG tablet Take 1 tablet (25 mg total) by mouth 2 (two) times daily as needed for dizziness. 30 tablet 0   sulfamethoxazole -trimethoprim  (BACTRIM  DS) 800-160 MG  tablet Take 1 tablet by mouth 2 (two) times daily. (Patient not taking: Reported on 05/05/2023) 14 tablet 0   No facility-administered medications prior to visit.    PAST MEDICAL HISTORY: Past Medical History:  Diagnosis Date   Anginal pain (HCC)    Arthritis    "all over"   Bladder cancer [facility])    chemo/radiation   Bladder tumor    Chronic lower back pain    Coronary artery disease    a. s/p CABG x 2 (LIMA->LAD, RIMA->RCA);  b. s/p multiple PCI's to Ramus;  c. 08/2011 Cath/PCI: LM 70% into ramus with 70-80% there->treated wtih 3.5x18 Xience Xpedition DES, LCX  nonobs, RCA occluded.  RIMA & LIMA patent, EF 55-65%   DJD (degenerative joint disease)    GERD (gastroesophageal reflux disease)    History of gout    Hyperlipidemia    Hypertension    Hypothyroidism    Neuropathy    OSA on CPAP    Prostate cancer (HCC)     PAST SURGICAL HISTORY: Past Surgical History:  Procedure Laterality Date   ABDOMINAL HERNIA REPAIR     BIOPSY  11/26/2015   Procedure: BIOPSY;  Surgeon: Ruby Corporal, MD;  [facility];  Service: Endoscopy;;  gastric esophagus   BIOPSY  01/20/2021   Procedure: BIOPSY;  Surgeon: Urban Garden, MD;  [facility];  Service: Gastroenterology;;   BIOPSY  10/28/2021   Procedure: BIOPSY;  Surgeon: Urban Garden, MD;  [facility];  Service: Gastroenterology;;   BLADDER TUMOR EXCISION     BROW LIFT Bilateral 04/27/2019   Procedure: BILATERAL BLEPHAROPLASTY;  Surgeon: Tarri Farm, MD;  Location: [facility];  Service: Ophthalmology;  Laterality: Bilateral;   CARDIAC CATHETERIZATION N/A 04/10/2015   Procedure: Left Heart Cath and Cors/Grafts Angiography;  Surgeon: Arnoldo Lapping, MD;  Location: [facility] INVASIVE CV LAB;  Service: Cardiovascular;  Laterality: N/A;   CARDIAC CATHETERIZATION N/A 04/10/2015   Procedure: Coronary Stent Intervention;  Surgeon: Arnoldo Lapping, MD;  Location: [facility] INVASIVE CV LAB;  Service:  Cardiovascular;  Laterality: N/A;   CATARACT EXTRACTION W/PHACO Right 06/23/2015   Procedure: CATARACT EXTRACTION PHACO AND INTRAOCULAR LENS PLACEMENT RIGHT EYE; CDE:  7.08;  Surgeon: Anner Kill, MD;  Location: [facility];  Service: Ophthalmology;  Laterality: Right;   CATARACT EXTRACTION W/PHACO Left 08/25/2015   Procedure: CATARACT EXTRACTION PHACO AND INTRAOCULAR LENS PLACEMENT LEFT EYE; CDE:  8.18;  Surgeon: Anner Kill, MD;  Location: [facility];  Service: Ophthalmology;  Laterality: Left;   COLONOSCOPY N/A 12/28/2012   Procedure: COLONOSCOPY;  Surgeon: Ruby Corporal, MD;  [facility];  Service: Endoscopy;  Laterality: N/A;  830 rescheduled   COLONOSCOPY WITH PROPOFOL  N/A 01/20/2021   Procedure: COLONOSCOPY WITH PROPOFOL ;  Surgeon: Sammi Crick  Myrlene Asper, MD;  [facility];  Service: Gastroenterology;  Laterality: N/A;  11:05   CORONARY ANGIOPLASTY WITH STENT PLACEMENT  2013; 04/10/2015   "this makes me a total of 7" (04/10/2015)   CORONARY ARTERY BYPASS GRAFT  1997   with (LIMA)   ESOPHAGEAL DILATION N/A 11/26/2015   Procedure: ESOPHAGEAL DILATION;  Surgeon: Ruby Corporal, MD;  [facility];  Service: Endoscopy;  Laterality: N/A;   ESOPHAGEAL DILATION N/A 05/23/2017   Procedure: ESOPHAGEAL DILATION;  Surgeon: Ruby Corporal, MD;  [facility];  Service: Endoscopy;  Laterality: N/A;   ESOPHAGEAL DILATION N/A 06/16/2017   Procedure: ESOPHAGEAL DILATION;  Surgeon: Ruby Corporal, MD;  [facility];  Service: Endoscopy;  Laterality: N/A;   ESOPHAGEAL DILATION  10/28/2021   Procedure: ESOPHAGEAL DILATION;  Surgeon: Urban Garden, MD;  [facility];  Service: Gastroenterology;;   ESOPHAGOGASTRODUODENOSCOPY N/A 11/26/2015   Procedure: ESOPHAGOGASTRODUODENOSCOPY (EGD);  Surgeon: Ruby Corporal, MD;  [facility];  Service: Endoscopy;  Laterality: N/A;  1:25   ESOPHAGOGASTRODUODENOSCOPY N/A 05/23/2017    Procedure: ESOPHAGOGASTRODUODENOSCOPY (EGD);  Surgeon: Ruby Corporal, MD;  [facility];  Service: Endoscopy;  Laterality: N/A;  1:15   ESOPHAGOGASTRODUODENOSCOPY N/A 06/16/2017   Procedure: ESOPHAGOGASTRODUODENOSCOPY (EGD);  Surgeon: Ruby Corporal, MD;  [facility];  Service: Endoscopy;  Laterality: N/A;  1240   ESOPHAGOGASTRODUODENOSCOPY (EGD) WITH PROPOFOL  N/A 01/20/2021   Procedure: ESOPHAGOGASTRODUODENOSCOPY (EGD) WITH PROPOFOL ;  Surgeon: Urban Garden, MD;  [facility];  Service: Gastroenterology;  Laterality: N/A;   ESOPHAGOGASTRODUODENOSCOPY (EGD) WITH PROPOFOL  N/A 10/28/2021   Procedure: ESOPHAGOGASTRODUODENOSCOPY (EGD) WITH PROPOFOL ;  Surgeon: Urban Garden, MD;  [facility];  Service: Gastroenterology;  Laterality: N/A;  215 ASA 2, pt will arrive at 10:45   GANGLION CYST EXCISION Right 01/25/2023   Procedure: REMOVAL GANGLION OF RIGHT WRIST;  Surgeon: Brunilda Capra, MD;  Location: [facility];  Service: Orthopedics;  Laterality: Right;   HERNIA REPAIR Left    IR IMAGING GUIDED PORT INSERTION  02/24/2022   KNEE CARTILAGE SURGERY Bilateral    LEFT HEART CATH AND CORS/GRAFTS ANGIOGRAPHY N/A 09/08/2016   Procedure: LEFT HEART CATH AND CORS/GRAFTS ANGIOGRAPHY;  Surgeon: Swaziland, Peter M, MD;  Location: [facility] INVASIVE CV LAB;  Service: Cardiovascular;  Laterality: N/A;   LEFT HEART CATHETERIZATION WITH CORONARY ANGIOGRAM N/A 09/08/2011   Procedure: LEFT HEART CATHETERIZATION WITH CORONARY ANGIOGRAM;  Surgeon: Arnoldo Lapping, MD;  Location: [facility] CATH LAB;  Service: Cardiovascular;  Laterality: N/A;   PERCUTANEOUS CORONARY STENT INTERVENTION (PCI-S) Right 09/08/2011   Procedure: PERCUTANEOUS CORONARY STENT INTERVENTION (PCI-S);  Surgeon: Arnoldo Lapping, MD;  Location: [facility] CATH LAB;  Service: Cardiovascular;  Laterality: Right;   POLYPECTOMY  01/20/2021   Procedure: POLYPECTOMY;  Surgeon: Urban Garden, MD;  [facility];  Service: Gastroenterology;;   ROBOT ASSISTED LAPAROSCOPIC RADICAL PROSTATECTOMY      FAMILY HISTORY: Family History  Problem Relation Age of Onset   Stroke Mother 28       died   Aneurysm Mother    Cancer Father 50       died    SOCIAL HISTORY: Social History   Socioeconomic History   Marital status: Widowed    Spouse name: Not on file   Number of children: Not on file   Years of education: Not on file   Highest education level: Not on file  Occupational History   Occupation: semi-retired  Tobacco Use   Smoking status: Former    Types: Cigars    Passive exposure: Past   Smokeless tobacco: Never   Tobacco comments:    "quit smoking in ~ 1978"  Vaping Use   Vaping status: Never Used  Substance and Sexual Activity   Alcohol use: Yes    Alcohol/week: 2.0 standard drinks of alcohol    Types: 2 Cans of beer per week    Comment: occasionally    Drug use: No   Sexual activity: Not Currently  Other Topics Concern   Not on file  Social History Narrative   Pt lives alone    Manages rental property    Social Drivers of Health   Financial Resource Strain: Low Risk  (08/10/2022)   Overall Financial Resource Strain (CARDIA)    Difficulty of Paying Living Expenses: Not hard at all  Food Insecurity: Low Risk  (12/01/2022)   Received from [facility]   Hunger Vital Sign    Worried About Running Out of Food in the Last Year: Never true    Ran Out of Food in the Last Year: Never true  Transportation Needs: No Transportation Needs (12/01/2022)   Received from [facility]    In the past 12 months, has lack of reliable transportation kept you from medical appointments, meetings, work or from getting things needed for daily living? : No  Physical Activity: Sufficiently Active (06/04/2022)   Exercise Vital Sign    Days of Exercise per Week: 5 days    Minutes of Exercise per Session: 30 min  Stress: Not on file  Social  Connections: Not on file  Intimate Partner Violence: Not At Risk (02/04/2022)   Humiliation, Afraid, Rape, and Kick questionnaire    Fear of Current or Ex-Partner: No    Emotionally Abused: No    Physically Abused: No    Sexually Abused: No     PHYSICAL EXAM  GENERAL EXAM/CONSTITUTIONAL: Vitals:  Vitals:   05/05/23 1456  BP: 133/61  Pulse: (!) 56  Weight: 207 lb (93.9 kg)  Height: 6\' 1"  (1.854 m)   Body mass index is 27.31 kg/m. Wt Readings from Last 3 Encounters:  05/05/23 207 lb (93.9 kg)  03/15/23 200 lb (90.7 kg)  03/11/23 200 lb (90.7 kg)   Patient is in no distress; well developed, nourished and groomed; neck is supple  CARDIOVASCULAR: Examination of carotid arteries is normal; no carotid bruits Regular rate and rhythm, no murmurs Examination of peripheral vascular system by observation and palpation is normal  EYES: Ophthalmoscopic exam of optic discs and posterior segments is normal; no papilledema or hemorrhages Vision Screening   Right eye Left eye Both eyes  Without correction 20/20 20/20 20/30   With correction 20/20 20/30 20/30     MUSCULOSKELETAL: Gait, strength, tone, movements noted in Neurologic exam below  NEUROLOGIC: MENTAL STATUS:      No data to display         awake, alert, oriented to person, place and time recent and remote memory intact normal attention and concentration language fluent, comprehension intact, naming intact fund of knowledge appropriate  CRANIAL NERVE:  2nd - no papilledema on fundoscopic exam 2nd, 3rd, 4th, 6th - pupils equal and reactive to light, visual fields full to confrontation, extraocular muscles intact, no nystagmus; DOUBLE VISION ON RIGHT AND LEFT GAZE; DOUBLE VISION WORSE WITH SUSTAINED UPGAZE 5th - facial sensation symmetric 7th - facial strength symmetric 8th -  hearing intact 9th - palate elevates symmetrically, uvula midline 11th - shoulder shrug symmetric 12th - tongue protrusion midline NASAL  SPEECH; MILD DYSARTHRIA ABLE TO COUNT TO 50 IN 1 BREATH  MOTOR:  normal bulk and tone, full strength in the BUE, BLE  SENSORY:  normal and symmetric to light touch  COORDINATION:  finger-nose-finger, fine finger movements normal  REFLEXES:  deep tendon reflexes TRACE and symmetric  GAIT/STATION:  narrow based gait     DIAGNOSTIC DATA (LABS, IMAGING, TESTING) - I reviewed patient records, labs, notes, testing and imaging myself where available.  Lab Results  Component Value Date   WBC 3.9 (L) 03/11/2023   HGB 9.3 (L) 03/11/2023   HCT 28.9 (L) 03/11/2023   MCV 105.5 (H) 03/11/2023   PLT 197 03/11/2023      Component Value Date/Time   NA 138 03/11/2023 1328   NA 145 (H) 11/11/2021 0000   K 3.9 03/11/2023 1328   CL 107 03/11/2023 1328   CO2 27 03/11/2023 1328   GLUCOSE 100 (H) 03/11/2023 1328   BUN 26 (H) 03/11/2023 1328   BUN 19 11/11/2021 0000   CREATININE 1.47 (H) 03/11/2023 1328   CREATININE 1.69 (H) 12/14/2021 1124   CALCIUM  8.7 (L) 03/11/2023 1328   PROT 6.7 03/11/2023 1328   PROT 6.9 10/06/2021 1542   ALBUMIN 3.6 03/11/2023 1328   ALBUMIN 4.5 10/06/2021 1542   AST 18 03/11/2023 1328   ALT 17 03/11/2023 1328   ALKPHOS 40 03/11/2023 1328   BILITOT 0.4 03/11/2023 1328   BILITOT 0.3 10/06/2021 1542   GFRNONAA 48 (L) 03/11/2023 1328   GFRAA >60 09/08/2016 1956   Lab Results  Component Value Date   CHOL 121 10/06/2021   HDL 36 (L) 10/06/2021   LDLCALC 49 10/06/2021   TRIG 226 (H) 10/06/2021   CHOLHDL 3.4 10/06/2021   No results found for: "HGBA1C" Lab Results  Component Value Date   VITAMINB12 189 02/17/2023   Lab Results  Component Value Date   TSH 2.888 02/17/2023    03/11/23 CTA head / neck 1. Abnormal soft tissue along the wall of the V1 segment left vertebral artery with multifocal irregularity of the vessel from the V1 segment to the V3 segment. Focal occlusion of the distal V3 segment just prior to entering the dura. Findings  concerning for vertebral artery dissection. 2. Severe (70% or greater) stenosis of the right carotid bifurcation. 3. Moderate (50-69%) stenosis of the left carotid bifurcation. 4. Moderate stenosis of the cavernous and ophthalmic right ICA. 5. No intracranial large vessel occlusion. 6. No acute intracranial abnormality. Mild chronic microvascular ischemic changes. 7. Prominent anterior endplate osteophytes in the upper thoracic spine which likely compress the esophagus. Associated patulous appearance of the cervical esophagus above this level.    ASSESSMENT AND PLAN  78 y.o. year old male here with:   Dx:  1. Myasthenia gravis (HCC)      PLAN:  FLUCTUATING, FATIGABLE PTOSIS, DOUBLE VISION, DYSARTHRIA, ARM AND LEG WEAKNESS (since ~Jan 2025) - signs and symptoms highly suspicious for neuromuscular junction disorder such as myasthenia gravis; follow up myasthenia ab testing  - pyridostigmine  30mg  twice a day 1-2 WEEKS; then increase to 60mg  twice a day or three times a day as tolerated  - start prednisone  20mg  daily   - start daily prilosec / protonix   - start calcium  (1500 mg per day) and vitamin D  (400 to 800 international units per day)    LEFT VERTEBRAL ARTERY DISSECTION (appears to  be incidental finding as fluctuating fatigable symptoms not consistent with this localization) - continue aspirin  81 + plavix  75mg  daily x 3-6 months; repeat CTA head / neck   No orders of the defined types were placed in this encounter.  Meds ordered this encounter  Medications   predniSONE  (DELTASONE ) 20 MG tablet    Sig: Take 1 tablet (20 mg total) by mouth daily.    Dispense:  30 tablet    Refill:  3   Return in about 3 months (around 08/04/2023) for MyChart visit (15 min).    Omega Bible, MD 05/05/2023, 3:38 PM Certified in Neurology, Neurophysiology and Neuroimaging  [facility] [address] [place], [place] [address] [phone]

## 2023-05-06 ENCOUNTER — Encounter: Payer: Self-pay | Admitting: Urology

## 2023-05-10 ENCOUNTER — Other Ambulatory Visit: Payer: Self-pay | Admitting: Nurse Practitioner

## 2023-05-12 ENCOUNTER — Other Ambulatory Visit: Payer: Self-pay | Admitting: Cardiovascular Disease

## 2023-05-13 DIAGNOSIS — G7000 Myasthenia gravis without (acute) exacerbation: Secondary | ICD-10-CM | POA: Diagnosis not present

## 2023-05-13 DIAGNOSIS — G8929 Other chronic pain: Secondary | ICD-10-CM | POA: Diagnosis not present

## 2023-05-15 ENCOUNTER — Other Ambulatory Visit: Payer: Self-pay | Admitting: Hematology

## 2023-05-16 ENCOUNTER — Encounter: Payer: Self-pay | Admitting: Hematology

## 2023-05-20 ENCOUNTER — Encounter: Payer: Self-pay | Admitting: Diagnostic Neuroimaging

## 2023-05-24 ENCOUNTER — Telehealth: Payer: Self-pay

## 2023-05-24 NOTE — Telephone Encounter (Signed)
 Pt Daughter retuning a called . Daughter was on break and request a callback . If Daughter does not answer  She request to be contact through   [facility].

## 2023-05-24 NOTE — Telephone Encounter (Signed)
 Attempted to call Pt daughter who sent [facility] message. No answer, LVM for call back.

## 2023-05-24 NOTE — Telephone Encounter (Signed)
 Spoke w/Pt regarding earlier reported symptoms. Pt states he is still having double vision but mostly in the evenings. Stated when he is watching tv it becomes blurred but if he tilts his head down it clears up. Discussed the medication changes at his 05/05/23 visit. Pt stated he has increased his pyridostigmine  to one tab (60mg ) TID and is tolerating well. Reported he started the prednisone  20 mg every day. Reported only taking Calcium  600mg  - informed Pt notes from visit say he is to take 1500 mg every day. Also reported taking Vit D3 1000 I.U.  - informed Pt it is noted he is to take 400-800 I.U. daily. Pt reported not taking Prilosec daily - Informed Pt, per visit notes he is to take Prilosec daily. Pt voiced understanding. Pt verified he is taking his ASA and Plavix  daily. Discussed pt taking doses recommended and contacting the office in about 2 weeks to tell us  how he is doing. Pt stated understanding.

## 2023-06-07 ENCOUNTER — Other Ambulatory Visit: Payer: Self-pay | Admitting: Hematology

## 2023-06-07 ENCOUNTER — Other Ambulatory Visit: Payer: Self-pay | Admitting: Cardiovascular Disease

## 2023-06-07 DIAGNOSIS — R011 Cardiac murmur, unspecified: Secondary | ICD-10-CM

## 2023-06-07 DIAGNOSIS — I10 Essential (primary) hypertension: Secondary | ICD-10-CM

## 2023-06-07 DIAGNOSIS — I200 Unstable angina: Secondary | ICD-10-CM

## 2023-06-09 ENCOUNTER — Telehealth: Payer: Self-pay

## 2023-06-09 ENCOUNTER — Other Ambulatory Visit: Payer: Self-pay | Admitting: *Deleted

## 2023-06-09 MED ORDER — VITAMIN B-12 1000 MCG PO TABS: 1000.0000 ug | ORAL_TABLET | Freq: Every day | ORAL | 3 refills | Status: AC

## 2023-06-09 NOTE — Telephone Encounter (Signed)
 Cld Pt to relay message from Dr. Salli Crawley regarding the pyridostigmine  that it may cause cramps and he may reduce his dose. Pt stated he stopped taking his atorvastatin , as he is also on fenofibrate , and the leg cramps have stopped. Pt also reports the swelling of his lips has stopped too but they are still monitoring foods to see if it may be food related. Since cramps have stopped discussed continuing pyridostigmine  as he is currently taking. Pt and daughter voiced understanding.

## 2023-06-22 ENCOUNTER — Other Ambulatory Visit: Payer: Self-pay

## 2023-06-23 ENCOUNTER — Other Ambulatory Visit: Payer: Self-pay | Admitting: Cardiovascular Disease

## 2023-06-30 ENCOUNTER — Other Ambulatory Visit: Payer: Self-pay | Admitting: Nurse Practitioner

## 2023-06-30 ENCOUNTER — Other Ambulatory Visit: Payer: Self-pay | Admitting: Cardiovascular Disease

## 2023-07-07 ENCOUNTER — Encounter: Payer: Self-pay | Admitting: Diagnostic Neuroimaging

## 2023-07-07 MED ORDER — PYRIDOSTIGMINE BROMIDE 60 MG PO TABS: 30.0000 mg | ORAL_TABLET | Freq: Three times a day (TID) | ORAL | 0 refills | Status: DC

## 2023-07-13 ENCOUNTER — Encounter: Payer: Self-pay | Admitting: Urology

## 2023-07-13 ENCOUNTER — Ambulatory Visit: Admitting: Urology

## 2023-07-13 VITALS — BP 166/69 | HR 52

## 2023-07-13 DIAGNOSIS — R8289 Other abnormal findings on cytological and histological examination of urine: Secondary | ICD-10-CM | POA: Diagnosis not present

## 2023-07-13 DIAGNOSIS — Z8551 Personal history of malignant neoplasm of bladder: Secondary | ICD-10-CM

## 2023-07-13 DIAGNOSIS — C679 Malignant neoplasm of bladder, unspecified: Secondary | ICD-10-CM

## 2023-07-13 LAB — MICROSCOPIC EXAMINATION
Bacteria, UA: NONE SEEN
WBC, UA: NONE SEEN /HPF (ref 0–5)

## 2023-07-13 LAB — URINALYSIS, ROUTINE W REFLEX MICROSCOPIC
Bilirubin, UA: NEGATIVE
Glucose, UA: NEGATIVE
Ketones, UA: NEGATIVE
Leukocytes,UA: NEGATIVE
Nitrite, UA: NEGATIVE
Protein,UA: NEGATIVE
Specific Gravity, UA: <1.005 — ABNORMAL LOW (ref 1.005–1.030)
Urobilinogen, Ur: 0.2 mg/dL (ref 0.2–1.0)
pH, UA: 6 (ref 5.0–7.5)

## 2023-07-13 MED ORDER — CIPROFLOXACIN HCL 500 MG PO TABS
500.0000 mg | ORAL_TABLET | Freq: Once | ORAL | Status: AC
  Administered 2023-07-13: 500 mg via ORAL

## 2023-07-13 NOTE — Progress Notes (Signed)
   07/13/23  CC: followup bladder cancer   HPI: [name] is a 78yo here for followup for muscle invasive bladder cancer treated with chemo radiation March 2024.  Blood pressure (!) 166/69, pulse (!) 52. NED. A&Ox3.   No respiratory distress   Abd soft, NT, ND Normal phallus with bilateral descended testicles  Cystoscopy Procedure Note  Patient identification was confirmed, informed consent was obtained, and patient was prepped using Betadine  solution.  Lidocaine  jelly was administered per urethral meatus.     Pre-Procedure: - Inspection reveals a normal caliber ureteral meatus.  Procedure: The flexible cystoscope was introduced without difficulty - No urethral strictures/lesions are present. - Enlarged prostate  - Normal bladder neck - Bilateral ureteral orifices identified - Bladder mucosa  reveals erythema around site of previous resection. Likely radiation changes - No bladder stones - No trabeculation     Post-Procedure: - Patient tolerated the procedure well  Assessment/ Plan: Uirne for cytology. Followup 3 months for cystoscopy  No follow-ups on file.  Steve Clara, MD

## 2023-07-13 NOTE — Patient Instructions (Signed)

## 2023-07-13 NOTE — Addendum Note (Signed)
 Addended by: ANN VELERIA SAUNDERS on: 07/13/2023 09:55 AM   Modules accepted: Orders

## 2023-07-14 ENCOUNTER — Other Ambulatory Visit: Payer: Self-pay

## 2023-07-15 LAB — CYTOLOGY, URINE

## 2023-07-21 ENCOUNTER — Other Ambulatory Visit: Payer: Self-pay | Admitting: Cardiovascular Disease

## 2023-07-21 ENCOUNTER — Telehealth: Payer: Self-pay | Admitting: Cardiovascular Disease

## 2023-07-21 MED ORDER — CARVEDILOL 6.25 MG PO TABS: 6.2500 mg | ORAL_TABLET | Freq: Two times a day (BID) | ORAL | 0 refills | Status: DC

## 2023-07-21 NOTE — Telephone Encounter (Signed)
*  STAT* If patient is at the pharmacy, call can be transferred to refill team.   1. Which medications need to be refilled? (please list name of each medication and dose if known) carvedilol  (COREG ) 6.25 MG tablet   2. Which pharmacy/location (including street and city if local pharmacy) is medication to be sent to? [id number] - [place], [place] - [address]   3. Do they need a 30 day or 90 day supply? 90  Patient has appt on 10/31

## 2023-07-21 NOTE — Telephone Encounter (Signed)
 Pt's medication was sent to pt's pharmacy as requested. Confirmation received.

## 2023-07-22 ENCOUNTER — Other Ambulatory Visit: Payer: Self-pay

## 2023-07-22 DIAGNOSIS — G4733 Obstructive sleep apnea (adult) (pediatric): Secondary | ICD-10-CM | POA: Diagnosis not present

## 2023-07-22 DIAGNOSIS — M461 Sacroiliitis, not elsewhere classified: Secondary | ICD-10-CM | POA: Diagnosis not present

## 2023-07-22 DIAGNOSIS — G7000 Myasthenia gravis without (acute) exacerbation: Secondary | ICD-10-CM | POA: Diagnosis not present

## 2023-07-22 DIAGNOSIS — C679 Malignant neoplasm of bladder, unspecified: Secondary | ICD-10-CM | POA: Diagnosis not present

## 2023-07-28 ENCOUNTER — Ambulatory Visit (HOSPITAL_COMMUNITY)
Admission: RE | Admit: 2023-07-28 | Discharge: 2023-07-28 | Disposition: A | Discharge status: Home / Self Care | Source: Ambulatory Visit | Attending: Family Medicine | Admitting: Family Medicine

## 2023-07-28 ENCOUNTER — Ambulatory Visit: Admitting: Family Medicine

## 2023-07-28 ENCOUNTER — Encounter: Payer: Self-pay | Admitting: Family Medicine

## 2023-07-28 ENCOUNTER — Ambulatory Visit: Payer: Self-pay | Admitting: Family Medicine

## 2023-07-28 VITALS — BP 138/72 | HR 55 | Temp 97.7°F | Ht 73.0 in | Wt 198.0 lb

## 2023-07-28 DIAGNOSIS — E785 Hyperlipidemia, unspecified: Secondary | ICD-10-CM

## 2023-07-28 DIAGNOSIS — R7309 Other abnormal glucose: Secondary | ICD-10-CM

## 2023-07-28 DIAGNOSIS — G7000 Myasthenia gravis without (acute) exacerbation: Secondary | ICD-10-CM

## 2023-07-28 DIAGNOSIS — M545 Low back pain, unspecified: Secondary | ICD-10-CM | POA: Insufficient documentation

## 2023-07-28 DIAGNOSIS — I350 Nonrheumatic aortic (valve) stenosis: Secondary | ICD-10-CM

## 2023-07-28 DIAGNOSIS — I10 Essential (primary) hypertension: Secondary | ICD-10-CM

## 2023-07-28 DIAGNOSIS — I25110 Atherosclerotic heart disease of native coronary artery with unstable angina pectoris: Secondary | ICD-10-CM

## 2023-07-28 DIAGNOSIS — N183 Chronic kidney disease, stage 3 unspecified: Secondary | ICD-10-CM | POA: Insufficient documentation

## 2023-07-28 DIAGNOSIS — G8929 Other chronic pain: Secondary | ICD-10-CM | POA: Diagnosis not present

## 2023-07-28 DIAGNOSIS — N1831 Chronic kidney disease, stage 3a: Secondary | ICD-10-CM | POA: Diagnosis not present

## 2023-07-28 DIAGNOSIS — E039 Hypothyroidism, unspecified: Secondary | ICD-10-CM | POA: Diagnosis not present

## 2023-07-28 MED ORDER — AMLODIPINE BESYLATE 10 MG PO TABS: 10.0000 mg | ORAL_TABLET | Freq: Every morning | ORAL | 3 refills | Status: DC

## 2023-07-28 MED ORDER — TRAMADOL HCL 50 MG PO TABS: 50.0000 mg | ORAL_TABLET | Freq: Three times a day (TID) | ORAL | 3 refills | Status: DC

## 2023-07-28 MED ORDER — LEVOTHYROXINE SODIUM 175 MCG PO TABS: 175.0000 ug | ORAL_TABLET | Freq: Every day | ORAL | 3 refills | Status: DC

## 2023-07-28 MED ORDER — ISOSORBIDE MONONITRATE ER 60 MG PO TB24: 60.0000 mg | ORAL_TABLET | Freq: Every day | ORAL | 3 refills | Status: DC

## 2023-07-28 MED ORDER — CLOPIDOGREL BISULFATE 75 MG PO TABS: 75.0000 mg | ORAL_TABLET | Freq: Every morning | ORAL | 3 refills | Status: DC

## 2023-07-28 MED ORDER — GABAPENTIN 600 MG PO TABS: 600.0000 mg | ORAL_TABLET | Freq: Three times a day (TID) | ORAL | 3 refills | Status: AC

## 2023-07-28 NOTE — Patient Instructions (Signed)
 Labs at your convenience.  Awaiting response from Neurology.  Follow up in ~ 3-6 months.

## 2023-07-29 ENCOUNTER — Encounter: Payer: Self-pay | Admitting: Hematology

## 2023-07-29 ENCOUNTER — Other Ambulatory Visit: Payer: Self-pay

## 2023-07-29 DIAGNOSIS — M545 Low back pain, unspecified: Secondary | ICD-10-CM

## 2023-07-29 LAB — CMP14+EGFR
ALT: 21 IU/L (ref 0–44)
AST: 17 IU/L (ref 0–40)
Albumin: 4.2 g/dL (ref 3.8–4.8)
Alkaline Phosphatase: 53 IU/L (ref 44–121)
BUN/Creatinine Ratio: 26 — ABNORMAL HIGH (ref 10–24)
BUN: 39 mg/dL — ABNORMAL HIGH (ref 8–27)
Bilirubin Total: 0.3 mg/dL (ref 0.0–1.2)
CO2: 22 mmol/L (ref 20–29)
Calcium: 9.4 mg/dL (ref 8.6–10.2)
Chloride: 101 mmol/L (ref 96–106)
Creatinine, Ser: 1.5 mg/dL — ABNORMAL HIGH (ref 0.76–1.27)
Globulin, Total: 2 g/dL (ref 1.5–4.5)
Glucose: 163 mg/dL — ABNORMAL HIGH (ref 70–99)
Potassium: 5 mmol/L (ref 3.5–5.2)
Sodium: 139 mmol/L (ref 134–144)
Total Protein: 6.2 g/dL (ref 6.0–8.5)
eGFR: 47 mL/min/1.73 — ABNORMAL LOW (ref >59)

## 2023-07-29 LAB — HEMOGLOBIN A1C
Est. average glucose Bld gHb Est-mCnc: 137 mg/dL
Hgb A1c MFr Bld: 6.4 % — ABNORMAL HIGH (ref 4.8–5.6)

## 2023-07-29 LAB — LIPID PANEL
Chol/HDL Ratio: 2.6 ratio (ref 0.0–5.0)
Cholesterol, Total: 150 mg/dL (ref 100–199)
HDL: 57 mg/dL (ref >39)
LDL Chol Calc (NIH): 70 mg/dL (ref 0–99)
Triglycerides: 130 mg/dL (ref 0–149)
VLDL Cholesterol Cal: 23 mg/dL (ref 5–40)

## 2023-07-29 LAB — TSH: TSH: 0.056 u[IU]/mL — ABNORMAL LOW (ref 0.450–4.500)

## 2023-07-31 ENCOUNTER — Other Ambulatory Visit: Payer: Self-pay | Admitting: Family Medicine

## 2023-07-31 ENCOUNTER — Encounter: Payer: Self-pay | Admitting: Family Medicine

## 2023-07-31 DIAGNOSIS — G8929 Other chronic pain: Secondary | ICD-10-CM | POA: Insufficient documentation

## 2023-07-31 MED ORDER — LEVOTHYROXINE SODIUM 150 MCG PO TABS: 150.0000 ug | ORAL_TABLET | Freq: Every day | ORAL | 0 refills | Status: DC

## 2023-07-31 NOTE — Assessment & Plan Note (Signed)
 Stable. Norvasc  refilled.

## 2023-07-31 NOTE — Assessment & Plan Note (Signed)
 Tramadol  refilled. Xray ordered.

## 2023-07-31 NOTE — Assessment & Plan Note (Signed)
 Has exertional angina but he is currently stable. Imdur  and Plavix  refilled today.

## 2023-07-31 NOTE — Assessment & Plan Note (Signed)
 TSH returned suppressed. Levothyroxine  dose was decreased.

## 2023-07-31 NOTE — Progress Notes (Signed)
 Subjective:  Patient ID: [name], male    DOB: Aug 28, 1945  Age: 78 y.o. MRN: [id number]  CC:   Chief Complaint  Patient presents with   New Patient (Initial Visit)    Here to establish care  Finished steroid pack for back pain today. Lower left sided back pain. Voice changes    HPI:  78 year old male with an extensive PMH including recently diagnosed Myasthenia Gravis presents to establish care.  Ongoing low back pain (left sided). No radiculopathy. Recently finished a burst of steroids with no significant improvement. He is currently on Prednisone  20 mg daily for MG. Patient wants to know what we can do to help his back pain. Is on Tramadol  (Takes 3 times daily). No recent imaging.  BP is fairly well controlled. Currently on Coreg  and Norvasc . Needs refill on Norvasc .  Has significant CAD. Is on Aspirin  and Plavix . Also is on Imdur . Has exertional angina. Needs refills.  Follows with Heme/Onc regarding anemia.  Endorsing voice change. ? From Myasthenia. Will reach out to Neurology regarding the plan for treatment for MG. Patient was told that he needs labs for monitoring given prednisone  use.   Patient Active Problem List   Diagnosis Date Noted   Chronic left-sided low back pain without sciatica 07/31/2023   Myasthenia gravis (HCC) 07/28/2023   CKD (chronic kidney disease) stage 3, GFR 30-59 ml/min (HCC) 07/28/2023   Bladder cancer (HCC) 01/29/2022   Autoimmune gastritis 12/14/2021   Iron deficiency anemia 12/14/2021   Barrett's esophagus 01/01/2021   Anemia 01/01/2021   Obstructive sleep apnea on CPAP 05/28/2020   Aortic stenosis 09/24/2011   Coronary artery disease 12/08/2010   Hypothyroidism 09/28/2008   Hyperlipidemia 09/28/2008   Essential hypertension 09/28/2008   Osteoarthritis 09/28/2008    Social Hx   Social History   Socioeconomic History   Marital status: Widowed    Spouse name: Not on file   Number of children: Not on file   Years of  education: Not on file   Highest education level: Not on file  Occupational History   Occupation: semi-retired  Tobacco Use   Smoking status: Former    Types: Cigars    Passive exposure: Past   Smokeless tobacco: Never   Tobacco comments:    quit smoking in ~ 1978  Vaping Use   Vaping status: Never Used  Substance and Sexual Activity   Alcohol use: Yes    Alcohol/week: 2.0 standard drinks of alcohol    Types: 2 Cans of beer per week    Comment: occasionally    Drug use: No   Sexual activity: Not Currently  Other Topics Concern   Not on file  Social History Narrative   Pt lives alone    Manages rental property    Social Drivers of Health   Financial Resource Strain: Low Risk  (08/10/2022)   Overall Financial Resource Strain (CARDIA)    Difficulty of Paying Living Expenses: Not hard at all  Food Insecurity: Low Risk  (12/01/2022)   Received from [facility]   Hunger Vital Sign    Within the past 12 months, you worried that your food would run out before you got money to buy more: Never true    Within the past 12 months, the food you bought just didn't last and you didn't have money to get more. : Never true  Transportation Needs: No Transportation Needs (12/01/2022)   Received from [facility]  In the past 12 months, has lack of reliable transportation kept you from medical appointments, meetings, work or from getting things needed for daily living? : No  Physical Activity: Sufficiently Active (06/04/2022)   Exercise Vital Sign    Days of Exercise per Week: 5 days    Minutes of Exercise per Session: 30 min  Stress: Not on file  Social Connections: Not on file    Review of Systems Per HPI  Objective:  BP 138/72   Pulse (!) 55   Temp 97.7 F (36.5 C)   Ht 6' 1 (1.854 m)   Wt 198 lb (89.8 kg)   SpO2 97%   BMI 26.12 kg/m      07/28/2023    2:04 PM 07/13/2023    9:09 AM 05/05/2023    2:56 PM  BP/Weight  Systolic BP 138 166 133   Diastolic BP 72 69 61  Wt. (Lbs) 198  207  BMI 26.12 kg/m2  27.31 kg/m2    Physical Exam Vitals and nursing note reviewed.  HENT:     Head: Normocephalic and atraumatic.  Eyes:     General:        Right eye: No discharge.        Left eye: No discharge.     Conjunctiva/sclera: Conjunctivae normal.  Cardiovascular:     Rate and Rhythm: Normal rate and regular rhythm.     Heart sounds: Murmur heard.  Pulmonary:     Effort: Pulmonary effort is normal. No respiratory distress.     Breath sounds: Normal breath sounds. No wheezing or rales.  Psychiatric:        Mood and Affect: Mood normal.        Behavior: Behavior normal.     Lab Results  Component Value Date   WBC 3.9 (L) 03/11/2023   HGB 9.3 (L) 03/11/2023   HCT 28.9 (L) 03/11/2023   PLT 197 03/11/2023   GLUCOSE 163 (H) 07/28/2023   CHOL 150 07/28/2023   TRIG 130 07/28/2023   HDL 57 07/28/2023   LDLCALC 70 07/28/2023   ALT 21 07/28/2023   AST 17 07/28/2023   NA 139 07/28/2023   K 5.0 07/28/2023   CL 101 07/28/2023   CREATININE 1.50 (H) 07/28/2023   BUN 39 (H) 07/28/2023   CO2 22 07/28/2023   TSH 0.056 (L) 07/28/2023   INR 1.0 05/29/2020   HGBA1C 6.4 (H) 07/28/2023     Assessment & Plan:  Coronary artery disease involving native heart with unstable angina pectoris, unspecified vessel or lesion type [facility]) Assessment & Plan: Has exertional angina but he is currently stable. Imdur  and Plavix  refilled today.  Orders: -     Isosorbide  Mononitrate ER; Take 1 tablet (60 mg total) by mouth daily.  Dispense: 135 tablet; Refill: 3 -     Clopidogrel  Bisulfate; Take 1 tablet (75 mg total) by mouth in the morning.  Dispense: 90 tablet; Refill: 3  Acquired hypothyroidism Assessment & Plan: TSH returned suppressed. Levothyroxine  dose was decreased.  Orders: -     TSH  Essential hypertension Assessment & Plan: Stable. Norvasc  refilled.  Orders: -     amLODIPine  Besylate; Take 1 tablet (10 mg total) by mouth in  the morning.  Dispense: 90 tablet; Refill: 3 -     CMP14+EGFR  Hyperlipidemia, unspecified hyperlipidemia type -     Lipid panel  Myasthenia gravis (HCC)  Stage 3a chronic kidney disease (HCC)  Elevated glucose -     Hemoglobin A1c  Chronic left-sided low back pain without sciatica Assessment & Plan: Tramadol  refilled. Xray ordered.  Orders: -     traMADol  HCl; Take 1 tablet (50 mg total) by mouth every 8 (eight) hours.  Dispense: 90 tablet; Refill: 3 -     DG Lumbar Spine Complete  Other orders -     Gabapentin ; Take 1 tablet (600 mg total) by mouth 3 (three) times daily.  Dispense: 270 tablet; Refill: 3    Follow-up:  3-6 months.  Jacqulyn Ahle DO [facility]

## 2023-08-04 ENCOUNTER — Other Ambulatory Visit: Payer: Self-pay | Admitting: Diagnostic Neuroimaging

## 2023-08-04 ENCOUNTER — Other Ambulatory Visit: Payer: Self-pay

## 2023-08-04 MED ORDER — FENOFIBRATE 160 MG PO TABS: 160.0000 mg | ORAL_TABLET | Freq: Every day | ORAL | 1 refills | Status: DC

## 2023-08-11 ENCOUNTER — Ambulatory Visit: Admitting: *Deleted

## 2023-08-11 DIAGNOSIS — Z23 Encounter for immunization: Secondary | ICD-10-CM

## 2023-08-15 ENCOUNTER — Encounter: Payer: Self-pay | Admitting: Diagnostic Neuroimaging

## 2023-08-15 ENCOUNTER — Telehealth: Admitting: Diagnostic Neuroimaging

## 2023-08-15 DIAGNOSIS — I7774 Dissection of vertebral artery: Secondary | ICD-10-CM

## 2023-08-15 MED ORDER — PREDNISONE 20 MG PO TABS: 20.0000 mg | ORAL_TABLET | Freq: Every day | ORAL | 3 refills | Status: DC

## 2023-08-15 MED ORDER — PYRIDOSTIGMINE BROMIDE 60 MG PO TABS: 30.0000 mg | ORAL_TABLET | Freq: Three times a day (TID) | ORAL | 0 refills | Status: DC

## 2023-08-15 NOTE — Progress Notes (Addendum)
 [facility]  PATIENT: [name] DOB: [birth year]  REFERRING CLINICIAN: Marvine Rush, MD HISTORY FROM: patient and daughter REASON FOR VISIT: follow up   HISTORICAL  CHIEF COMPLAINT:  Chief Complaint  Patient presents with   Myasthenia Gravis    HISTORY OF PRESENT ILLNESS:   UPDATE (08/15/23, VRP): Since last visit, drooping eyelids and double vision better. But having some weight gait, dysphagia, dysarthria, irritability.   UPDATE (05/09/23, VRP): Since last visit, symptoms are stable, but not improved. Still with double vision and drooping vision. Some mild issues with arms and legs.   PRIOR HPI (03/15/23): 78 year old male here for evaluation of double vision and slurred speech.  Patient presented to hospital on 03/11/2023 for several months of intermittent double vision, lip swelling, numb feeling, tongue swelling, gait difficulty, slurred speech, fuzzy thoughts, 30 pound weight loss.  Patient had MRI of the brain which was unremarkable.  CTA of the head and neck showed possible left vertebral artery dissection extending from V1-V3 segment.  Also found to have other bilateral carotid bifurcation stenoses.  He was started on aspirin  and Plavix  and recommended to follow-up in neurology clinic.  Myasthenia gravis antibody testing was sent off but results not back yet.    REVIEW OF SYSTEMS: Full 14 system review of systems performed and negative with exception of: as per HPI.  ALLERGIES: Allergies  Allergen Reactions   Codeine Nausea Only   Gemtesa  [Vibegron ] Other (See Comments)    Patient reports swelling of throat, lips and dizziness when taking this medication   Pollen Extract Other (See Comments)    Seasonal    HOME MEDICATIONS: Outpatient Medications Prior to Visit  Medication Sig Dispense Refill   acetaminophen  (TYLENOL ) 500 MG tablet Take 500 mg by mouth every 6 (six) hours as needed for moderate pain (pain score 4-6).     amLODipine   (NORVASC ) 10 MG tablet Take 1 tablet (10 mg total) by mouth in the morning. 90 tablet 3   aspirin  EC 81 MG tablet Take 81 mg by mouth every evening. 30 tablet    Calcium  Carbonate-Vit D-Min (CALCIUM  1200 PO) Take 1,200 mg by mouth daily.     carvedilol  (COREG ) 6.25 MG tablet Take 1 tablet (6.25 mg total) by mouth 2 (two) times daily. 90 tablet 0   cetirizine (ZYRTEC) 10 MG tablet Take 10 mg by mouth every evening.     clopidogrel  (PLAVIX ) 75 MG tablet Take 1 tablet (75 mg total) by mouth in the morning. 90 tablet 3   Coenzyme Q10 300 MG CAPS Take 300 mg by mouth every evening.     cyanocobalamin  (VITAMIN B12) 1000 MCG tablet Take 1 tablet (1,000 mcg total) by mouth daily. 90 tablet 3   fenofibrate  160 MG tablet Take 1 tablet (160 mg total) by mouth daily. 90 tablet 1   gabapentin  (NEURONTIN ) 600 MG tablet Take 1 tablet (600 mg total) by mouth 3 (three) times daily. 270 tablet 3   GLUCOSAMINE-CHONDROITIN PO Take 1 tablet by mouth every evening.     isosorbide  mononitrate (IMDUR ) 60 MG 24 hr tablet Take 1 tablet (60 mg total) by mouth daily. 135 tablet 3   levothyroxine  (SYNTHROID ) 150 MCG tablet Take 1 tablet (150 mcg total) by mouth daily. 90 tablet 0   meclizine  (ANTIVERT ) 25 MG tablet Take 25 mg by mouth every 6 (six) hours as needed.     Multiple Vitamins-Minerals (VITRIUM 50+ ADULT-MULTI PO) Take by mouth.     nitroGLYCERIN  (NITROSTAT ) 0.4  MG SL tablet PLACE 1 TABLET UNDER THE TONGUE EVERY 5 MINUTES AS NEEDED FOR CHEST PAIN 25 tablet 0   Omega-3 Fatty Acids (OMEGA-3 FISH OIL PO) Take by mouth.     Polyethyl Glycol-Propyl Glycol (LUBRICANT EYE DROPS) 0.4-0.3 % SOLN Place 1-2 drops into both eyes 3 (three) times daily as needed (dry/irritated eyes.).     traMADol  (ULTRAM ) 50 MG tablet Take 1 tablet (50 mg total) by mouth every 8 (eight) hours. 90 tablet 3   predniSONE  (DELTASONE ) 20 MG tablet TAKE 1 TABLET(20 MG) BY MOUTH DAILY 30 tablet 3   pyridostigmine  (MESTINON ) 60 MG tablet Take 0.5-1  tablets (30-60 mg total) by mouth 3 (three) times daily. 270 tablet 0   No facility-administered medications prior to visit.    PAST MEDICAL HISTORY: Past Medical History:  Diagnosis Date   Anginal pain (HCC)    Arthritis    all over   Bladder cancer (HCC)    chemo/radiation   Bladder tumor    Chronic lower back pain    Coronary artery disease    a. s/p CABG x 2 (LIMA->LAD, RIMA->RCA);  b. s/p multiple PCI's to Ramus;  c. 08/2011 Cath/PCI: LM 70% into ramus with 70-80% there->treated wtih 3.5x18 Xience Xpedition DES, LCX  nonobs, RCA occluded.  RIMA & LIMA patent, EF 55-65%   DJD (degenerative joint disease)    GERD (gastroesophageal reflux disease)    History of gout    Hyperlipidemia    Hypertension    Hypothyroidism    Neuropathy    OSA on CPAP    Prostate cancer (HCC)     PAST SURGICAL HISTORY: Past Surgical History:  Procedure Laterality Date   ABDOMINAL HERNIA REPAIR     BIOPSY  11/26/2015   Procedure: BIOPSY;  Surgeon: Claudis RAYMOND Rivet, MD;  [facility];  Service: Endoscopy;;  gastric esophagus   BIOPSY  01/20/2021   Procedure: BIOPSY;  Surgeon: Eartha Angelia Sieving, MD;  [facility]: [facility];  Service: Gastroenterology;;   BIOPSY  10/28/2021   Procedure: BIOPSY;  Surgeon: Eartha Angelia Sieving, MD;  [facility];  Service: Gastroenterology;;   BLADDER TUMOR EXCISION     BROW LIFT Bilateral 04/27/2019   Procedure: BILATERAL BLEPHAROPLASTY;  Surgeon: Harrie Agent, MD;  Location: [facility];  Service: Ophthalmology;  Laterality: Bilateral;   CARDIAC CATHETERIZATION N/A 04/10/2015   Procedure: Left Heart Cath and Cors/Grafts Angiography;  Surgeon: Ozell Fell, MD;  Location: [facility] INVASIVE CV LAB;  Service: Cardiovascular;  Laterality: N/A;   CARDIAC CATHETERIZATION N/A 04/10/2015   Procedure: Coronary Stent Intervention;  Surgeon: Ozell Fell, MD;  Location: [facility] INVASIVE CV LAB;  Service: Cardiovascular;  Laterality: N/A;    CATARACT EXTRACTION W/PHACO Right 06/23/2015   Procedure: CATARACT EXTRACTION PHACO AND INTRAOCULAR LENS PLACEMENT RIGHT EYE; CDE:  7.08;  Surgeon: Cherene Mania, MD;  Location: [facility];  Service: Ophthalmology;  Laterality: Right;   CATARACT EXTRACTION W/PHACO Left 08/25/2015   Procedure: CATARACT EXTRACTION PHACO AND INTRAOCULAR LENS PLACEMENT LEFT EYE; CDE:  8.18;  Surgeon: Cherene Mania, MD;  Location: [facility];  Service: Ophthalmology;  Laterality: Left;   COLONOSCOPY N/A 12/28/2012   Procedure: COLONOSCOPY;  Surgeon: Claudis RAYMOND Rivet, MD;  [facility];  Service: Endoscopy;  Laterality: N/A;  830 rescheduled   COLONOSCOPY WITH PROPOFOL  N/A 01/20/2021   Procedure: COLONOSCOPY WITH PROPOFOL ;  Surgeon: Eartha Angelia Sieving, MD;  [facility];  Service: Gastroenterology;  Laterality: N/A;  11:05  CORONARY ANGIOPLASTY WITH STENT PLACEMENT  2013; 04/10/2015   this makes me a total of 7 (04/10/2015)   CORONARY ARTERY BYPASS GRAFT  1997   with (LIMA)   ESOPHAGEAL DILATION N/A 11/26/2015   Procedure: ESOPHAGEAL DILATION;  Surgeon: Claudis RAYMOND Rivet, MD;  [facility];  Service: Endoscopy;  Laterality: N/A;   ESOPHAGEAL DILATION N/A 05/23/2017   Procedure: ESOPHAGEAL DILATION;  Surgeon: Rivet Claudis RAYMOND, MD;  [facility];  Service: Endoscopy;  Laterality: N/A;   ESOPHAGEAL DILATION N/A 06/16/2017   Procedure: ESOPHAGEAL DILATION;  Surgeon: Rivet Claudis RAYMOND, MD;  [facility];  Service: Endoscopy;  Laterality: N/A;   ESOPHAGEAL DILATION  10/28/2021   Procedure: ESOPHAGEAL DILATION;  Surgeon: Eartha Angelia Sieving, MD;  [facility];  Service: Gastroenterology;;   ESOPHAGOGASTRODUODENOSCOPY N/A 11/26/2015   Procedure: ESOPHAGOGASTRODUODENOSCOPY (EGD);  Surgeon: Claudis RAYMOND Rivet, MD;  [facility];  Service: Endoscopy;  Laterality: N/A;  1:25   ESOPHAGOGASTRODUODENOSCOPY N/A 05/23/2017   Procedure: ESOPHAGOGASTRODUODENOSCOPY  (EGD);  Surgeon: Rivet Claudis RAYMOND, MD;  [facility];  Service: Endoscopy;  Laterality: N/A;  1:15   ESOPHAGOGASTRODUODENOSCOPY N/A 06/16/2017   Procedure: ESOPHAGOGASTRODUODENOSCOPY (EGD);  Surgeon: Rivet Claudis RAYMOND, MD;  [facility];  Service: Endoscopy;  Laterality: N/A;  1240   ESOPHAGOGASTRODUODENOSCOPY (EGD) WITH PROPOFOL  N/A 01/20/2021   Procedure: ESOPHAGOGASTRODUODENOSCOPY (EGD) WITH PROPOFOL ;  Surgeon: Eartha Angelia Sieving, MD;  [facility];  Service: Gastroenterology;  Laterality: N/A;   ESOPHAGOGASTRODUODENOSCOPY (EGD) WITH PROPOFOL  N/A 10/28/2021   Procedure: ESOPHAGOGASTRODUODENOSCOPY (EGD) WITH PROPOFOL ;  Surgeon: Eartha Angelia Sieving, MD;  [facility];  Service: Gastroenterology;  Laterality: N/A;  215 ASA 2, pt will arrive at 10:45   GANGLION CYST EXCISION Right 01/25/2023   Procedure: REMOVAL GANGLION OF RIGHT WRIST;  Surgeon: Murrell Drivers, MD;  Location: [facility];  Service: Orthopedics;  Laterality: Right;   HERNIA REPAIR Left    IR IMAGING GUIDED PORT INSERTION  02/24/2022   KNEE CARTILAGE SURGERY Bilateral    LEFT HEART CATH AND CORS/GRAFTS ANGIOGRAPHY N/A 09/08/2016   Procedure: LEFT HEART CATH AND CORS/GRAFTS ANGIOGRAPHY;  Surgeon: Swaziland, Peter M, MD;  Location: [facility] INVASIVE CV LAB;  Service: Cardiovascular;  Laterality: N/A;   LEFT HEART CATHETERIZATION WITH CORONARY ANGIOGRAM N/A 09/08/2011   Procedure: LEFT HEART CATHETERIZATION WITH CORONARY ANGIOGRAM;  Surgeon: Ozell Fell, MD;  Location: [facility] CATH LAB;  Service: Cardiovascular;  Laterality: N/A;   PERCUTANEOUS CORONARY STENT INTERVENTION (PCI-S) Right 09/08/2011   Procedure: PERCUTANEOUS CORONARY STENT INTERVENTION (PCI-S);  Surgeon: Ozell Fell, MD;  Location: [facility] CATH LAB;  Service: Cardiovascular;  Laterality: Right;   POLYPECTOMY  01/20/2021   Procedure: POLYPECTOMY;  Surgeon: Eartha Angelia Sieving, MD;  [facility];   Service: Gastroenterology;;   ROBOT ASSISTED LAPAROSCOPIC RADICAL PROSTATECTOMY      FAMILY HISTORY: Family History  Problem Relation Age of Onset   Stroke Mother 46       died   Aneurysm Mother    Cancer Father 82       died    SOCIAL HISTORY: Social History   Socioeconomic History   Marital status: Widowed    Spouse name: Not on file   Number of children: Not on file   Years of education: Not on file   Highest education level: Not on file  Occupational History   Occupation: semi-retired  Tobacco Use   Smoking status: Former  Types: Cigars    Passive exposure: Past   Smokeless tobacco: Never   Tobacco comments:    quit smoking in ~ 1978  Vaping Use   Vaping status: Never Used  Substance and Sexual Activity   Alcohol use: Yes    Alcohol/week: 2.0 standard drinks of alcohol    Types: 2 Cans of beer per week    Comment: occasionally    Drug use: No   Sexual activity: Not Currently  Other Topics Concern   Not on file  Social History Narrative   Pt lives alone    Manages rental property    Social Drivers of Health   Financial Resource Strain: Low Risk  (08/10/2022)   Overall Financial Resource Strain (CARDIA)    Difficulty of Paying Living Expenses: Not hard at all  Food Insecurity: Low Risk  (12/01/2022)   Received from [facility]   Hunger Vital Sign    Within the past 12 months, you worried that your food would run out before you got money to buy more: Never true    Within the past 12 months, the food you bought just didn't last and you didn't have money to get more. : Never true  Transportation Needs: No Transportation Needs (12/01/2022)   Received from [facility]    In the past 12 months, has lack of reliable transportation kept you from medical appointments, meetings, work or from getting things needed for daily living? : No  Physical Activity: Sufficiently Active (06/04/2022)   Exercise Vital Sign    Days of Exercise per Week:  5 days    Minutes of Exercise per Session: 30 min  Stress: Not on file  Social Connections: Not on file  Intimate Partner Violence: Not At Risk (02/04/2022)   Humiliation, Afraid, Rape, and Kick questionnaire    Fear of Current or Ex-Partner: No    Emotionally Abused: No    Physically Abused: No    Sexually Abused: No     PHYSICAL EXAM  GENERAL EXAM/CONSTITUTIONAL: Vitals:  There were no vitals filed for this visit.  There is no height or weight on file to calculate BMI. Wt Readings from Last 3 Encounters:  07/28/23 198 lb (89.8 kg)  05/05/23 207 lb (93.9 kg)  03/15/23 200 lb (90.7 kg)   Patient is in no distress; well developed, nourished and groomed; neck is supple  CARDIOVASCULAR: Examination of carotid arteries is normal; no carotid bruits Regular rate and rhythm, no murmurs Examination of peripheral vascular system by observation and palpation is normal  EYES: Ophthalmoscopic exam of optic discs and posterior segments is normal; no papilledema or hemorrhages No results found.   MUSCULOSKELETAL: Gait, strength, tone, movements noted in Neurologic exam below  NEUROLOGIC: MENTAL STATUS:      No data to display         awake, alert, oriented to person, place and time recent and remote memory intact normal attention and concentration language fluent, comprehension intact, naming intact fund of knowledge appropriate  CRANIAL NERVE:  2nd - no papilledema on fundoscopic exam 2nd, 3rd, 4th, 6th - pupils equal and reactive to light, visual fields full to confrontation, extraocular muscles intact, no nystagmus 5th - facial sensation symmetric 7th - facial strength symmetric 8th - hearing intact 9th - palate elevates symmetrically, uvula midline 11th - shoulder shrug symmetric 12th - tongue protrusion midline NASAL SPEECH; MILD DYSARTHRIA  MOTOR:  normal bulk and tone, full strength in the BUE, BLE  SENSORY:  normal and symmetric to light  touch  COORDINATION:  finger-nose-finger, fine finger movements normal  REFLEXES:  deep tendon reflexes TRACE and symmetric  GAIT/STATION:  narrow based gait     DIAGNOSTIC DATA (LABS, IMAGING, TESTING) - I reviewed patient records, labs, notes, testing and imaging myself where available.  Lab Results  Component Value Date   WBC 3.9 (L) 03/11/2023   HGB 9.3 (L) 03/11/2023   HCT 28.9 (L) 03/11/2023   MCV 105.5 (H) 03/11/2023   PLT 197 03/11/2023      Component Value Date/Time   NA 139 07/28/2023 1512   K 5.0 07/28/2023 1512   CL 101 07/28/2023 1512   CO2 22 07/28/2023 1512   GLUCOSE 163 (H) 07/28/2023 1512   GLUCOSE 100 (H) 03/11/2023 1328   BUN 39 (H) 07/28/2023 1512   CREATININE 1.50 (H) 07/28/2023 1512   CREATININE 1.69 (H) 12/14/2021 1124   CALCIUM  9.4 07/28/2023 1512   PROT 6.2 07/28/2023 1512   ALBUMIN 4.2 07/28/2023 1512   AST 17 07/28/2023 1512   ALT 21 07/28/2023 1512   ALKPHOS 53 07/28/2023 1512   BILITOT 0.3 07/28/2023 1512   GFRNONAA 48 (L) 03/11/2023 1328   GFRAA >60 09/08/2016 1956   Lab Results  Component Value Date   CHOL 150 07/28/2023   HDL 57 07/28/2023   LDLCALC 70 07/28/2023   TRIG 130 07/28/2023   CHOLHDL 2.6 07/28/2023   Lab Results  Component Value Date   HGBA1C 6.4 (H) 07/28/2023   Lab Results  Component Value Date   VITAMINB12 189 02/17/2023   Lab Results  Component Value Date   TSH 0.056 (L) 07/28/2023      Component Ref Range & Units (hover) 03/15/23   AChR Binding Ab, Serum 70.10 High       03/11/23 CTA head / neck 1. Abnormal soft tissue along the wall of the V1 segment left vertebral artery with multifocal irregularity of the vessel from the V1 segment to the V3 segment. Focal occlusion of the distal V3 segment just prior to entering the dura. Findings concerning for vertebral artery dissection. 2. Severe (70% or greater) stenosis of the right carotid bifurcation. 3. Moderate (50-69%) stenosis of the left  carotid bifurcation. 4. Moderate stenosis of the cavernous and ophthalmic right ICA. 5. No intracranial large vessel occlusion. 6. No acute intracranial abnormality. Mild chronic microvascular ischemic changes. 7. Prominent anterior endplate osteophytes in the upper thoracic spine which likely compress the esophagus. Associated patulous appearance of the cervical esophagus above this level.    ASSESSMENT AND PLAN  78 y.o. year old male here with:  Dx:  1. Vertebral artery dissection (HCC)     PLAN:  FLUCTUATING, FATIGABLE PTOSIS, DOUBLE VISION, DYSARTHRIA, ARM AND LEG WEAKNESS (since ~Jan 2025) - signs and symptoms consistent with myasthenia gravis  - current MG-ADL score 5 (08/15/23)  - pyridostigmine  30mg  twice a day 1-2 WEEKS; then increase to 60mg  twice a day or three times a day as tolerated  - continue prednisone  20mg  daily; will plan to taper off after starting vyvgart (home injection)  - continue prilosec / protonix   - continue calcium  (1500 mg per day) and vitamin D  (400 to 800 international units per day)    LEFT VERTEBRAL ARTERY DISSECTION (appears to be incidental finding as fluctuating fatigable symptoms not consistent with this localization) - continue aspirin  81 + plavix  75mg  daily x 3-6 months; repeat CTA head / neck   Orders Placed This Encounter  Procedures  CT ANGIO HEAD W OR WO CONTRAST   CT ANGIO NECK W OR WO CONTRAST   Meds ordered this encounter  Medications   predniSONE  (DELTASONE ) 20 MG tablet    Sig: Take 1 tablet (20 mg total) by mouth daily with breakfast.    Dispense:  30 tablet    Refill:  3   pyridostigmine  (MESTINON ) 60 MG tablet    Sig: Take 0.5-1 tablets (30-60 mg total) by mouth 3 (three) times daily.    Dispense:  270 tablet    Refill:  0   Return in about 4 months (around 12/15/2023).  Virtual Visit via Video Note  I connected with [name] on 08/15/2023 at  2:45 PM EDT by a video enabled telemedicine application  and verified that I am speaking with the correct person using two identifiers.   I discussed the limitations of evaluation and management by telemedicine and the availability of in person appointments. The patient expressed understanding and agreed to proceed.  Patient is at home and I am at the office.   I spent 25 minutes of face-to-face and non-face-to-face time with patient.  This included previsit chart review, lab review, study review, order entry, electronic health record documentation, patient education.      EDUARD FABIENE HANLON, MD 08/15/2023, 2:56 PM Certified in Neurology, Neurophysiology and Neuroimaging  [facility] [address] [place], [place] [address] [phone]

## 2023-08-17 ENCOUNTER — Telehealth: Payer: Self-pay | Admitting: Diagnostic Neuroimaging

## 2023-08-17 NOTE — Telephone Encounter (Signed)
 no auth required sent to [facility] [phone]

## 2023-08-18 ENCOUNTER — Ambulatory Visit (HOSPITAL_COMMUNITY)
Admission: RE | Admit: 2023-08-18 | Discharge: 2023-08-18 | Disposition: A | Discharge status: Home / Self Care | Payer: Medicare Other | Source: Ambulatory Visit | Attending: Hematology | Admitting: Hematology

## 2023-08-18 ENCOUNTER — Inpatient Hospital Stay: Payer: Medicare Other | Attending: Hematology

## 2023-08-18 DIAGNOSIS — Z8551 Personal history of malignant neoplasm of bladder: Secondary | ICD-10-CM | POA: Insufficient documentation

## 2023-08-18 DIAGNOSIS — I6502 Occlusion and stenosis of left vertebral artery: Secondary | ICD-10-CM | POA: Diagnosis not present

## 2023-08-18 DIAGNOSIS — I7774 Dissection of vertebral artery: Secondary | ICD-10-CM | POA: Diagnosis not present

## 2023-08-18 DIAGNOSIS — C678 Malignant neoplasm of overlapping sites of bladder: Secondary | ICD-10-CM | POA: Diagnosis not present

## 2023-08-18 DIAGNOSIS — N3289 Other specified disorders of bladder: Secondary | ICD-10-CM | POA: Diagnosis not present

## 2023-08-18 DIAGNOSIS — K5730 Diverticulosis of large intestine without perforation or abscess without bleeding: Secondary | ICD-10-CM | POA: Diagnosis not present

## 2023-08-18 DIAGNOSIS — N189 Chronic kidney disease, unspecified: Secondary | ICD-10-CM | POA: Insufficient documentation

## 2023-08-18 DIAGNOSIS — C679 Malignant neoplasm of bladder, unspecified: Secondary | ICD-10-CM | POA: Diagnosis not present

## 2023-08-18 DIAGNOSIS — D509 Iron deficiency anemia, unspecified: Secondary | ICD-10-CM | POA: Diagnosis not present

## 2023-08-18 DIAGNOSIS — D508 Other iron deficiency anemias: Secondary | ICD-10-CM

## 2023-08-18 DIAGNOSIS — I6523 Occlusion and stenosis of bilateral carotid arteries: Secondary | ICD-10-CM | POA: Diagnosis not present

## 2023-08-18 LAB — CBC WITH DIFFERENTIAL/PLATELET
Abs Immature Granulocytes: 0.22 K/uL — ABNORMAL HIGH (ref 0.00–0.07)
Basophils Absolute: 0 K/uL (ref 0.0–0.1)
Basophils Relative: 0 %
Eosinophils Absolute: 0 K/uL (ref 0.0–0.5)
Eosinophils Relative: 1 %
HCT: 32.2 % — ABNORMAL LOW (ref 39.0–52.0)
Hemoglobin: 10.1 g/dL — ABNORMAL LOW (ref 13.0–17.0)
Immature Granulocytes: 4 %
Lymphocytes Relative: 27 %
Lymphs Abs: 1.7 K/uL (ref 0.7–4.0)
MCH: 34.2 pg — ABNORMAL HIGH (ref 26.0–34.0)
MCHC: 31.4 g/dL (ref 30.0–36.0)
MCV: 109.2 fL — ABNORMAL HIGH (ref 80.0–100.0)
Monocytes Absolute: 0.6 K/uL (ref 0.1–1.0)
Monocytes Relative: 9 %
Neutro Abs: 3.7 K/uL (ref 1.7–7.7)
Neutrophils Relative %: 59 %
Platelets: 152 K/uL (ref 150–400)
RBC: 2.95 MIL/uL — ABNORMAL LOW (ref 4.22–5.81)
RDW: 15.1 % (ref 11.5–15.5)
WBC: 6.4 K/uL (ref 4.0–10.5)
nRBC: 0 % (ref 0.0–0.2)

## 2023-08-18 LAB — IRON AND TIBC
Iron: 103 ug/dL (ref 45–182)
Saturation Ratios: 28 % (ref 17.9–39.5)
TIBC: 365 ug/dL (ref 250–450)
UIBC: 262 ug/dL

## 2023-08-18 LAB — COMPREHENSIVE METABOLIC PANEL WITH GFR
ALT: 22 U/L (ref 0–44)
AST: 18 U/L (ref 15–41)
Albumin: 3.6 g/dL (ref 3.5–5.0)
Alkaline Phosphatase: 34 U/L — ABNORMAL LOW (ref 38–126)
Anion gap: 11 (ref 5–15)
BUN: 26 mg/dL — ABNORMAL HIGH (ref 8–23)
CO2: 27 mmol/L (ref 22–32)
Calcium: 9.1 mg/dL (ref 8.9–10.3)
Chloride: 102 mmol/L (ref 98–111)
Creatinine, Ser: 1.26 mg/dL — ABNORMAL HIGH (ref 0.61–1.24)
GFR, Estimated: 58 mL/min — ABNORMAL LOW (ref >60)
Glucose, Bld: 119 mg/dL — ABNORMAL HIGH (ref 70–99)
Potassium: 3.6 mmol/L (ref 3.5–5.1)
Sodium: 140 mmol/L (ref 135–145)
Total Bilirubin: 0.6 mg/dL (ref 0.0–1.2)
Total Protein: 6.4 g/dL — ABNORMAL LOW (ref 6.5–8.1)

## 2023-08-18 LAB — FERRITIN: Ferritin: 286 ng/mL (ref 24–336)

## 2023-08-18 MED ORDER — IOHEXOL 350 MG/ML SOLN
100.0000 mL | Freq: Once | INTRAVENOUS | Status: AC | PRN
  Administered 2023-08-18: 100 mL via INTRAVENOUS

## 2023-08-23 ENCOUNTER — Encounter: Payer: Self-pay | Admitting: Diagnostic Neuroimaging

## 2023-08-24 ENCOUNTER — Telehealth: Payer: Self-pay | Admitting: Neurology

## 2023-08-24 NOTE — Telephone Encounter (Signed)
 Called the daughter to discuss further as well. There was no answer. Left a message advising that we need pt's signature in order to move forward. Advised her to call back or reply to mychart.  **If daughter calls back, please advise the medication that we are wanting to start required the patient signature and or caregiver's signature to move forward. We can have this at the front desk to have them come by and sign. I can email to them for them to print and upload on mychart, whatever is easiest.

## 2023-08-24 NOTE — Telephone Encounter (Signed)
 Vyvgart subcutaneous injection to adminster at home is recommended by MD. Start form has been completed. Placed in MD's office to complete and sign. Once provider and patient sign will be able to submit to [facility] path for benefits investigation etc.

## 2023-08-25 ENCOUNTER — Other Ambulatory Visit: Payer: Self-pay

## 2023-08-25 ENCOUNTER — Other Ambulatory Visit: Payer: Self-pay | Admitting: Cardiovascular Disease

## 2023-08-25 ENCOUNTER — Ambulatory Visit: Payer: Medicare Other | Admitting: Hematology

## 2023-08-25 NOTE — Telephone Encounter (Signed)
 Start form completed and signed and sent to [facility] mypath. They will attempt to forward to the specialty pharmacy [facility].

## 2023-08-28 ENCOUNTER — Ambulatory Visit: Payer: Self-pay | Admitting: Diagnostic Neuroimaging

## 2023-08-28 NOTE — Progress Notes (Signed)
 Patient Care Team: Cook, Jayce G, DO as PCP - General (Family Medicine) Wonda Sharper, MD as PCP - Cardiology (Cardiology) Celestia Joesph SQUIBB, RN as Oncology Nurse Navigator (Medical Oncology)  Clinic Day:  08/29/2023  Referring physician: Marvine Rush, MD   CHIEF COMPLAINT:  CC: T2 N0 multifocal bladder cancer   [name] 78 y.o. male was transferred to my care after his prior physician has left.   ASSESSMENT & PLAN:   Assessment & Plan: ROMIR KLIMOWICZ  is a 78 y.o. male with T2 N0 multifocal bladder cancer and normocytic to macrocytic anemia.  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  There is no evidence of carcinoma - Surveillance per NCCN guidelines: - Cystoscopy every 3 months for the first 2 years, every 6 months for 3 and 4 years and then annually years 5-10.  Greater than 10 years as clinically indicated - Imaging every 3 to 6 months for the first 2 years, and then annually until 5 years, as clinically indicated after that - CBC and CMP every 3 to 6 months for the first year followed by as clinically indicated - Urine cytology every 6 to 12 months for the first 2 years followed by as clinically indicated -Patient is on his second year of surveillance.  Continue CT CAP every 6 months - Continue cystoscopy every 3 months with Dr. Sherrilee and urine cytology as indicated with him   Return to clinic in 6 months with labs, CT scan for follow-up Iron deficiency anemia, unspecified iron deficiency anemia type Anemia likely secondary to iron deficiency and anemia of chronic kidney disease.  - Labs reviewed today: CBC: Hemoglobin: 10.1, iron panel: Normal with TSAT of 28 and ferritin of 286. -Patient has microcytic anemia.  Last vitamin B12 was 189.  Will repeat  with next blood draw -Will also obtain multiple myeloma workup with next blood draw. -No indication for EPO supplementation at this time.  But can consider if hemoglobin continues to worsen and falls below 10 -Continue to follow with nephrology    The patient understands the plans discussed today and is in agreement with them.  He knows to contact our office if he develops concerns prior to his next appointment.  90 minutes of total time was spent for this patient encounter, including preparation, face-to-face counseling with the patient and coordination of care, physical exam, and documentation of the encounter.   LILLETTE Verneta SAUNDERS Teague,acting as a Neurosurgeon for Mickiel Dry, MD.,have documented all relevant documentation on the behalf of Mickiel Dry, MD,as directed by  Mickiel Dry, MD while in the presence of Mickiel Dry, MD.  I, Mickiel Dry MD, have reviewed the above documentation for accuracy and completeness, and I agree with the above.     Mickiel Dry, MD  [facility] [place] - A DEPT OF [facility] [address] [place] [address] Dept: [phone] Dept Fax: [phone]   Orders Placed This Encounter  Procedures   CT ABDOMEN PELVIS W CONTRAST    Standing Status:   Future    Expected Date:   02/29/2024    Expiration Date:   08/28/2024    If indicated for the ordered procedure, I authorize the administration of contrast media per Radiology protocol:  Yes    Does the patient have a contrast media/X-ray dye allergy?:   No    Preferred imaging location?:   [facility]    Release to patient:   Immediate [1]    If indicated for the ordered procedure, I authorize the administration of oral contrast media per Radiology protocol:   Yes   CBC with Differential    Standing Status:   Future    Expected Date:   02/27/2024    Expiration Date:   05/27/2024   Comprehensive metabolic panel    Standing  Status:   Future    Expected Date:   02/27/2024    Expiration Date:   05/27/2024   Vitamin B12    Standing Status:   Future    Expected Date:   02/27/2024    Expiration Date:   05/27/2024   Ferritin    Standing Status:   Future    Expected Date:   02/27/2024    Expiration Date:   05/27/2024   Iron and TIBC ([facility] ONLY)    Standing Status:   Future    Expected Date:   02/27/2024    Expiration Date:   05/27/2024   Folate    Standing Status:   Future    Expected Date:   02/27/2024    Expiration Date:   05/27/2024   Kappa/lambda light chains    Standing Status:   Future    Expected Date:   02/27/2024    Expiration Date:   05/27/2024   Multiple Myeloma Panel (SPEP&IFE w/QIG)    Standing Status:   Future    Expected Date:   02/27/2024    Expiration Date:   05/27/2024     ONCOLOGY HISTORY:   I have reviewed his chart and materials related to his cancer extensively and collaborated history with the patient. Summary of oncologic history is as follows:   Diagnosis: T2 N0 multifocal muscle invasive bladder cancer   -12/16/2014: Initial Diagnosis malignant neoplasm of lateral wall of bladder  -03/14/2018: TURBT. Pathology: Invasive papillary urothelial carcinoma, high grade. Tumor involves lamina propria. Muscularis propria present, not involved by tumor. Tumor size was 2.2 x 1.1 x 0.3 cm.  -05/04/2018 - 06/13/2018: Initial BCG induction course -08/22/2018: Bladder biopsies. Left Posterior Bladder Wall Pathology: High-grade urothelial carcinoma. No invasion is identified. No muscularis propria is present.  -Bladder Dome Pathology: High-grade urothelial carcinoma. No invasion is identified. Muscularis propria is present and negative for tumor.  -Comments: Immunohistochemistry for p53 and CYK20 are performed on both parts. CYK20 stain is diffusely positive, supporting the diagnosis. P53 stains show wild type. The biopsies contain small fragments of high-grade urothelial carcinoma.  While no  well-formed papillary architecture is present, we favor the findings represent residual high-grade papillary urothelial carcinoma.  -08/29/2018- 10/01/2018: Intravesicular valrubicin   -02/20/2019 Right Bladder Tumor Dome biopsy. Pathology: Minute fragment of benign fibromuscular tissue. No surface urothelium present.  -09/25/2019: TURBT. Pathology: Non-invasive high-grade urothelial carcinoma. No invasion identified. Marked cautery artifact. Muscularis propria present, uninvolved by malignancy. -11/02/2019: CT A&P: Status post prostatectomy. No findings of nodal metastasis or solid organ metastasis within the abdomen or pelvis. Asymmetric right-sided bladder wall thickening measuring up to 9 mm. Cannot rule out underlying residual/recurrent urothelial neoplasm. -10/24/2019-11/28/2019: BCG induction course x 6 -06/03/2020: TURBT. Pathology: Papillary urothelial carcinoma, high grade. No invasion identified. Muscularis propria present. Muscularis propria uninvolved. No flat carcinoma in situ present. No variant histology identified.   -07/02/2020 - 08/20/2020: Intravesical gemcitabine /docetaxel   -03/10/2021:  TURBT. Pathology: Non-invasive high-grade papillary urothelial carcinoma. Carcinoma does not invade laminal propria. Muscularis propria (detrusor muscle) is present and is not involved by carcinoma. Urothelial carcinoma in situ present.  -04/29/2021 - 06/03/2021: Reinduction of intravesical gemcitabine /docetaxel   -07/06/2021: Cystoscopy : No visible tumors. Cytology of urine showed: High-grade urothelial carcinoma.  -12/30/2021: Cystoscopy with TURBT. Cystoscopy showed: Bladder with papillary right lateral wall mass and anterior right and lateral left as well as dome lesions. Pathology of Right bladder neck tumor, left anterior bladder tumor, and right anterior bladder tumor revealed: Non-invasive high-grade papillary urothelial carcinoma. Carcinoma does not invade lamina propria. Muscular  propria is present and is not involved by carcinoma. Carcinoma in situ is not identified. Pathology of left lateral bladder wall tumor revealed: Invasive high-grade papillary urothelial carcinoma. Carcinoma focally invade into muscular propria. Carcinoma in situ is identified. -Patient developed chronic neuropathy and CKD, excluding him from cisplatin-based neoadjuvant chemotherapy followed by cystectomy. Patient preferred trimodality therapy over surgery when treatments options were discussed with him at radiation oncology in [facility]. -02/08/2022: CT CAP: Perivesical stranding with irregular thickening of the RIGHT greater than LEFT urinary bladder wall. No discrete masslike lesion. Mildly heterogeneous enhancement of the urinary bladder. Findings may be related to post treatment related changes. Correlate with any signs of cystitis. Cystoscopic correlation may also be helpful if not recently performed.No filling defect in the upper tracts to indicate upper tract disease. No secondary sign of stricture. -03/24/2022: Cystoscopy and TURBT. Pathology: Urothelial carcinoma in situ. No evidence of invasion. Muscularis propria present, uninvolved by malignancy. Evidence of granulation tissue reaction as well as chronic and acute inflammation. -04/12/2022 - 05/31/2022: XRT with 5-FU and mitomycin  -07/28/2022: CT AP: Marked asymmetric wall thickening of the bladder which appears progressive but could in part be due to lack of distension. Small perivesical lymph nodes but no pelvic sidewall or external iliac adenopathy.No findings for metastatic disease involving the abdomen/pelvis or osseous structures. -12/20/2022 Cystoscopy: No strictures or lesions present. -02/11/2023 CT AP: Stable marked diffuse bladder wall thickening with soft tissue stranding in perivesical fat. No new or progressive disease within the pelvis or abdomen. -04/13/2023: Cystoscopy: No strictures or lesions present. -04/25/2023: Urine  cytology: Dysplastic urothelial cells -07/13/2023 Cystoscopy: No strictures or lesions present.Bladder mucosa  reveals erythema around site of previous resection. Likely radiation changes. Urine cytology: Negative -08/18/2023: CT AP: No findings of active malignancy. The previous diffuse urinary bladder wall thickening has improved with only mild diffuse wall thickening currently. No obvious focal lesion in the bladder wall.  Current Treatment:  Surveillance  INTERVAL HISTORY:   BRAILON DON is here today for follow up.   He reports no blood in urine, dysuria.  He saw Dr. Sherrilee in July and had cystoscopy done with no evidence of cancer.  He denies weight loss, loss of appetite.  He has symptoms of myasthenia gravis and follows with neurology.  He has no other complaints today.  I have reviewed the past medical history, past surgical history, social history and family history with the patient and they are unchanged from previous note.  ALLERGIES:  is allergic to codeine, gemtesa  [vibegron ], and pollen extract.  MEDICATIONS:  Current Outpatient Medications  Medication Sig Dispense Refill   acetaminophen  (TYLENOL ) 500 MG tablet Take 500 mg by mouth every 6 (six) hours as needed for moderate pain (pain score 4-6).     amLODipine  (NORVASC ) 10 MG tablet Take 1 tablet (10 mg total) by mouth in the morning. 90 tablet 3  aspirin  EC 81 MG tablet Take 81 mg by mouth every evening. 30 tablet    Calcium  Carbonate-Vit D-Min (CALCIUM  1200 PO) Take 1,200 mg by mouth daily.     carvedilol  (COREG ) 6.25 MG tablet TAKE 1 TABLET(6.25 MG) BY MOUTH TWICE DAILY 90 tablet 0   cetirizine (ZYRTEC) 10 MG tablet Take 10 mg by mouth every evening.     clopidogrel  (PLAVIX ) 75 MG tablet Take 1 tablet (75 mg total) by mouth in the morning. 90 tablet 3   Coenzyme Q10 300 MG CAPS Take 300 mg by mouth every evening.     cyanocobalamin  (VITAMIN B12) 1000 MCG tablet Take 1 tablet (1,000 mcg total) by mouth daily. 90  tablet 3   fenofibrate  160 MG tablet Take 1 tablet (160 mg total) by mouth daily. 90 tablet 1   gabapentin  (NEURONTIN ) 600 MG tablet Take 1 tablet (600 mg total) by mouth 3 (three) times daily. 270 tablet 3   GLUCOSAMINE-CHONDROITIN PO Take 1 tablet by mouth every evening.     isosorbide  mononitrate (IMDUR ) 60 MG 24 hr tablet Take 1 tablet (60 mg total) by mouth daily. 135 tablet 3   levothyroxine  (SYNTHROID ) 150 MCG tablet Take 1 tablet (150 mcg total) by mouth daily. 90 tablet 0   meclizine  (ANTIVERT ) 25 MG tablet Take 25 mg by mouth every 6 (six) hours as needed.     Multiple Vitamins-Minerals (VITRIUM 50+ ADULT-MULTI PO) Take by mouth.     nitroGLYCERIN  (NITROSTAT ) 0.4 MG SL tablet PLACE 1 TABLET UNDER THE TONGUE EVERY 5 MINUTES AS NEEDED FOR CHEST PAIN 25 tablet 0   Omega-3 Fatty Acids (OMEGA-3 FISH OIL PO) Take by mouth.     Polyethyl Glycol-Propyl Glycol (LUBRICANT EYE DROPS) 0.4-0.3 % SOLN Place 1-2 drops into both eyes 3 (three) times daily as needed (dry/irritated eyes.).     predniSONE  (DELTASONE ) 20 MG tablet Take 1 tablet (20 mg total) by mouth daily with breakfast. 30 tablet 3   pyridostigmine  (MESTINON ) 60 MG tablet Take 0.5-1 tablets (30-60 mg total) by mouth 3 (three) times daily. 270 tablet 0   traMADol  (ULTRAM ) 50 MG tablet Take 1 tablet (50 mg total) by mouth every 8 (eight) hours. 90 tablet 3   No current facility-administered medications for this visit.    REVIEW OF SYSTEMS:   Constitutional: Denies fevers, chills or abnormal weight loss Eyes: Denies blurriness of vision Ears, nose, mouth, throat, and face: Denies mucositis or sore throat Respiratory: Denies cough, dyspnea or wheezes Cardiovascular: Denies palpitation, chest discomfort or lower extremity swelling Gastrointestinal:  Denies nausea, heartburn or change in bowel habits Skin: Denies abnormal skin rashes Lymphatics: Denies new lymphadenopathy or easy bruising Neurological:Denies numbness, tingling or new  weaknesses Behavioral/Psych: Mood is stable, no new changes  All other systems were reviewed with the patient and are negative.   VITALS:  Blood pressure (!) 129/57, pulse (!) 54, temperature 97.8 F (36.6 C), temperature source Tympanic, resp. rate 17, weight 204 lb 9.6 oz (92.8 kg), SpO2 98%.  Wt Readings from Last 3 Encounters:  08/29/23 204 lb 9.6 oz (92.8 kg)  07/28/23 198 lb (89.8 kg)  05/05/23 207 lb (93.9 kg)    Body mass index is 26.99 kg/m.  Performance status (ECOG): 2 - Symptomatic, <50% confined to bed  PHYSICAL EXAM:   GENERAL:alert, no distress and comfortable SKIN: skin color, texture, turgor are normal, no rashes or significant lesions LYMPH:  no palpable lymphadenopathy in the cervical, axillary or inguinal LUNGS: clear to auscultation  and percussion with normal breathing effort HEART: regular rate & rhythm and no murmurs and no lower extremity edema ABDOMEN:abdomen soft, non-tender and normal bowel sounds Musculoskeletal:no cyanosis of digits and no clubbing  NEURO: alert & oriented x 3 with fluent speech, no focal motor/sensory deficits  LABORATORY DATA:  I have reviewed the data as listed    Component Value Date/Time   NA 140 08/18/2023 0914   NA 139 07/28/2023 1512   K 3.6 08/18/2023 0914   CL 102 08/18/2023 0914   CO2 27 08/18/2023 0914   GLUCOSE 119 (H) 08/18/2023 0914   BUN 26 (H) 08/18/2023 0914   BUN 39 (H) 07/28/2023 1512   CREATININE 1.26 (H) 08/18/2023 0914   CREATININE 1.69 (H) 12/14/2021 1124   CALCIUM  9.1 08/18/2023 0914   PROT 6.4 (L) 08/18/2023 0914   PROT 6.2 07/28/2023 1512   ALBUMIN 3.6 08/18/2023 0914   ALBUMIN 4.2 07/28/2023 1512   AST 18 08/18/2023 0914   ALT 22 08/18/2023 0914   ALKPHOS 34 (L) 08/18/2023 0914   BILITOT 0.6 08/18/2023 0914   BILITOT 0.3 07/28/2023 1512   GFRNONAA 58 (L) 08/18/2023 0914   GFRAA >60 09/08/2016 1956     Lab Results  Component Value Date   WBC 6.4 08/18/2023   NEUTROABS 3.7 08/18/2023    HGB 10.1 (L) 08/18/2023   HCT 32.2 (L) 08/18/2023   MCV 109.2 (H) 08/18/2023   PLT 152 08/18/2023     RADIOGRAPHIC STUDIES: I have personally reviewed the radiological images as listed and agreed with the findings in the report.  CT ABDOMEN PELVIS W CONTRAST CLINICAL DATA:  Invasive bladder cancer restaging  * Tracking Code: BO *  EXAM: CT ABDOMEN AND PELVIS WITH CONTRAST  TECHNIQUE: Multidetector CT imaging of the abdomen and pelvis was performed using the standard protocol following bolus administration of intravenous contrast.  RADIATION DOSE REDUCTION: This exam was performed according to the departmental dose-optimization program which includes automated exposure control, adjustment of the mA and/or kV according to patient size and/or use of iterative reconstruction technique.  CONTRAST:  OMNIPAQUE  IOHEXOL  350 MG/ML SOLN  COMPARISON:  02/11/2023  FINDINGS: Lower chest: Aortic, left anterior descending, and right coronary artery atheromatous calcification. Mitral valve calcification. Prior CABG. Mild lingular scarring.  Chronically stable 5 mm right middle lobe nodule on image 8 series 4, considered benign given long-term chronicity. No further imaging workup of this lesion is indicated.  Hepatobiliary: Unremarkable  Pancreas: Unremarkable  Spleen: Unremarkable  Adrenals/Urinary Tract: Benign right renal cysts warrant no further imaging workup.  Density surrounding the left renal collecting system for example on image 30 of series 17 does not appear to be particularly enhancing and is probably from inflammation or fluid density surrounding the collecting system rather than tumor in the collecting system. Today's exam was not optimized as an assessment of the urothelium, and delayed images only include the level of the kidneys but not the mid and distal ureters an urinary bladder.  Both adrenal glands appear normal.  The previous diffuse urinary  bladder wall thickening has improved with only mild diffuse wall thickening currently. No obvious focal lesion in the bladder wall.  Stomach/Bowel: Sigmoid colon diverticulosis. Scattered diverticula in the descending colon.  Vascular/Lymphatic: Atherosclerosis is present, including aortoiliac atherosclerotic disease. Atheromatous plaque noted dorsally at the origin of the celiac trunk and also somewhat circumferentially at the origin of the superior mesenteric artery. This may contribute to up to moderate degrees of stenosis but no overt  occlusion of either vessel is identified. Atherosclerotic calcification noted proximally in the renal arteries. No pathologic adenopathy identified.  Reproductive: Small scrotal hydroceles. Fatty right spermatic cord. Prostatectomy.  Other: Trace presacral edema, similar to prior.  Musculoskeletal: Thoracic and lumbar spondylosis with multilevel lumbar impingement.  IMPRESSION: 1. No findings of active malignancy. 2. The previous diffuse urinary bladder wall thickening has improved with only mild diffuse wall thickening currently. No obvious focal lesion in the bladder wall. 3. Density surrounding the left renal collecting system does not appear to be particularly enhancing and is probably from inflammation or fluid density surrounding the collecting system rather than tumor in the collecting system. Today's exam was not optimized as an assessment of the urothelium, and delayed images only include the level of the kidneys but not the mid and distal ureters an urinary bladder. 4. Sigmoid colon diverticulosis. 5. Small scrotal hydroceles. 6. Thoracic and lumbar spondylosis with multilevel lumbar impingement. 7. Prior prostatectomy. 8.  Aortic Atherosclerosis (ICD10-I70.0).  Electronically Signed   By: Ryan Salvage M.D.   On: 08/29/2023 11:09

## 2023-08-29 ENCOUNTER — Telehealth: Payer: Self-pay | Admitting: Neurology

## 2023-08-29 ENCOUNTER — Inpatient Hospital Stay: Admitting: Oncology

## 2023-08-29 VITALS — BP 129/57 | HR 54 | Temp 97.8°F | Resp 17 | Wt 204.6 lb

## 2023-08-29 DIAGNOSIS — C678 Malignant neoplasm of overlapping sites of bladder: Secondary | ICD-10-CM | POA: Diagnosis not present

## 2023-08-29 DIAGNOSIS — N189 Chronic kidney disease, unspecified: Secondary | ICD-10-CM | POA: Diagnosis not present

## 2023-08-29 DIAGNOSIS — Z8551 Personal history of malignant neoplasm of bladder: Secondary | ICD-10-CM | POA: Diagnosis not present

## 2023-08-29 DIAGNOSIS — D509 Iron deficiency anemia, unspecified: Secondary | ICD-10-CM | POA: Diagnosis not present

## 2023-08-29 NOTE — Patient Instructions (Signed)
 [facility] at [facility] Discharge Instructions   You were seen and examined today by Dr. Davonna.  She reviewed the results of your lab work which are normal/stable.   We will see you back in 6 months. We will repeat lab work and a scan prior to this visit.    Return as scheduled.    Thank you for choosing [facility] at [facility] to provide your oncology and hematology care.  To afford each patient quality time with our provider, please arrive at least 15 minutes before your scheduled appointment time.   If you have a lab appointment with the Cancer Center please come in thru the Main Entrance and check in at the main information desk.  You need to re-schedule your appointment should you arrive 10 or more minutes late.  We strive to give you quality time with our providers, and arriving late affects you and other patients whose appointments are after yours.  Also, if you no show three or more times for appointments you may be dismissed from the clinic at the providers discretion.     Again, thank you for choosing [facility].  Our hope is that these requests will decrease the amount of time that you wait before being seen by our physicians.       _____________________________________________________________  Should you have questions after your visit to [facility], please contact our office at [phone] and follow the prompts.  Our office hours are 8:00 a.m. and 4:30 p.m. Monday - Friday.  Please note that voicemails left after 4:00 p.m. may not be returned until the following business day.  We are closed weekends and major holidays.  You do have access to a nurse 24-7, just call the main number to the clinic [phone] and do not press any options, hold on the line and a nurse will answer the phone.    For prescription refill requests, have your pharmacy contact our office and allow 72 hours.    Due to  Covid, you will need to wear a mask upon entering the hospital. If you do not have a mask, a mask will be given to you at the Main Entrance upon arrival. For doctor visits, patients may have 1 support person age 26 or older with them. For treatment visits, patients can not have anyone with them due to social distancing guidelines and our immunocompromised population.

## 2023-08-29 NOTE — Telephone Encounter (Signed)
 PA request form sent by the patient insurance to be completed. Filled out and completed the PA and sent to [employer] for the pt. Received confirmation that it went through. Will await determination

## 2023-08-30 NOTE — Telephone Encounter (Signed)
 Received a fax with additional questions. Completed and submitted to the number provided to fax. Received confirmation.  Refer # [id number]

## 2023-08-30 NOTE — Telephone Encounter (Signed)
 [employer] called to  request to speak to Nurse about Clarification on PA For pt Medication Vygart,,  rep hung up before I can get nurse on Phone  , No callback number was giving

## 2023-09-01 NOTE — Telephone Encounter (Signed)
 [employer] called in to ask about clarifying some answers on his PA.   I had already submitted this on the information that was sent 8/19. Rep was able to locate this information. She stated that she would submit this to review.

## 2023-09-06 NOTE — Telephone Encounter (Signed)
 Rosina Ferrari from [employer] called wanting to let the provider know that the denial has been over turned and the Vyvgart has now been approved and they will reach out to the pt to inform them as well.  Phone number to call if there are any questions is [phone]

## 2023-09-07 NOTE — Telephone Encounter (Signed)
 Called the number provided in message (assuming that is for [facility] with [employer]) there was no answer LVM for her to call back.   **If she calls back I just wanted to confirm that with the medication being approved [employer] would be the one to help get patient scheduled and set up.

## 2023-09-07 NOTE — Telephone Encounter (Signed)
 Rosina with [employer] called back. Informed that they will be in touch with scheduling the patient

## 2023-09-12 NOTE — Assessment & Plan Note (Signed)
 Anemia likely secondary to iron deficiency and anemia of chronic kidney disease.  - Labs reviewed today: CBC: Hemoglobin: 10.1, iron panel: Normal with TSAT of 28 and ferritin of 286. -Patient has microcytic anemia.  Last vitamin B12 was 189.  Will repeat with next blood draw -Will also obtain multiple myeloma workup with next blood draw. -No indication for EPO supplementation at this time.  But can consider if hemoglobin continues to worsen and falls below 10 -Continue to follow with nephrology

## 2023-09-12 NOTE — Assessment & Plan Note (Addendum)
 Stage II T2 N0 multifocal muscle invasive bladder cancer s/p chemo RT with 5FU and mitomycin  (05/2022) now on surveillance  - We reviewed the CT scan results in detail.  There is no evidence of carcinoma - Surveillance per NCCN guidelines: - Cystoscopy every 3 months for the first 2 years, every 6 months for 3 and 4 years and then annually years 5-10.  Greater than 10 years as clinically indicated - Imaging every 3 to 6 months for the first 2 years, and then annually until 5 years, as clinically indicated after that - CBC and CMP every 3 to 6 months for the first year followed by as clinically indicated - Urine cytology every 6 to 12 months for the first 2 years followed by as clinically indicated -Patient is on his second year of surveillance.  Continue CT CAP every 6 months - Continue cystoscopy every 3 months with Dr. Sherrilee and urine cytology as indicated with him   Return to clinic in 6 months with labs, CT scan for follow-up

## 2023-09-16 ENCOUNTER — Encounter: Payer: Self-pay | Admitting: Family Medicine

## 2023-09-17 ENCOUNTER — Encounter: Payer: Self-pay | Admitting: Diagnostic Neuroimaging

## 2023-09-23 DIAGNOSIS — G7000 Myasthenia gravis without (acute) exacerbation: Secondary | ICD-10-CM | POA: Diagnosis not present

## 2023-09-29 ENCOUNTER — Encounter: Payer: Self-pay | Admitting: Cardiovascular Disease

## 2023-09-29 ENCOUNTER — Other Ambulatory Visit: Payer: Self-pay | Admitting: Cardiovascular Disease

## 2023-09-29 DIAGNOSIS — I25110 Atherosclerotic heart disease of native coronary artery with unstable angina pectoris: Secondary | ICD-10-CM

## 2023-09-29 MED ORDER — ISOSORBIDE MONONITRATE ER 60 MG PO TB24: 60.0000 mg | ORAL_TABLET | Freq: Every day | ORAL | 1 refills | Status: DC

## 2023-09-29 MED ORDER — CLOPIDOGREL BISULFATE 75 MG PO TABS: 75.0000 mg | ORAL_TABLET | Freq: Every morning | ORAL | 1 refills | Status: AC

## 2023-09-30 ENCOUNTER — Other Ambulatory Visit: Payer: Self-pay | Admitting: Family Medicine

## 2023-09-30 DIAGNOSIS — G7000 Myasthenia gravis without (acute) exacerbation: Secondary | ICD-10-CM | POA: Diagnosis not present

## 2023-10-01 ENCOUNTER — Encounter: Payer: Self-pay | Admitting: Diagnostic Neuroimaging

## 2023-10-05 ENCOUNTER — Other Ambulatory Visit: Payer: Self-pay | Admitting: Cardiovascular Disease

## 2023-10-05 DIAGNOSIS — I25110 Atherosclerotic heart disease of native coronary artery with unstable angina pectoris: Secondary | ICD-10-CM

## 2023-10-06 MED ORDER — ISOSORBIDE MONONITRATE ER 60 MG PO TB24: 90.0000 mg | ORAL_TABLET | Freq: Every day | ORAL | 1 refills | Status: DC

## 2023-10-06 NOTE — Addendum Note (Signed)
 Addended by: Alizey Noren L on: 10/06/2023 02:48 PM   Modules accepted: Orders

## 2023-10-07 DIAGNOSIS — G7000 Myasthenia gravis without (acute) exacerbation: Secondary | ICD-10-CM | POA: Diagnosis not present

## 2023-10-10 MED ORDER — PREDNISONE 20 MG PO TABS: 20.0000 mg | ORAL_TABLET | Freq: Every day | ORAL | 1 refills | Status: DC

## 2023-10-10 MED ORDER — PYRIDOSTIGMINE BROMIDE 60 MG PO TABS: 30.0000 mg | ORAL_TABLET | Freq: Three times a day (TID) | ORAL | 1 refills | Status: DC

## 2023-10-11 ENCOUNTER — Ambulatory Visit: Admitting: Family Medicine

## 2023-10-19 ENCOUNTER — Ambulatory Visit: Admitting: Urology

## 2023-10-19 VITALS — BP 146/68 | HR 53

## 2023-10-19 DIAGNOSIS — Z8551 Personal history of malignant neoplasm of bladder: Secondary | ICD-10-CM

## 2023-10-19 DIAGNOSIS — C679 Malignant neoplasm of bladder, unspecified: Secondary | ICD-10-CM

## 2023-10-19 DIAGNOSIS — Z08 Encounter for follow-up examination after completed treatment for malignant neoplasm: Secondary | ICD-10-CM | POA: Diagnosis not present

## 2023-10-19 LAB — URINALYSIS, ROUTINE W REFLEX MICROSCOPIC
Bilirubin, UA: NEGATIVE
Glucose, UA: NEGATIVE
Ketones, UA: NEGATIVE
Nitrite, UA: NEGATIVE
Specific Gravity, UA: 1.025 (ref 1.005–1.030)
Urobilinogen, Ur: 0.2 mg/dL (ref 0.2–1.0)
pH, UA: 6 (ref 5.0–7.5)

## 2023-10-19 LAB — MICROSCOPIC EXAMINATION
Bacteria, UA: NONE SEEN
RBC, Urine: >30 /HPF — AB (ref 0–2)

## 2023-10-19 MED ORDER — CIPROFLOXACIN HCL 500 MG PO TABS
500.0000 mg | ORAL_TABLET | Freq: Once | ORAL | Status: AC
  Administered 2023-10-19: 500 mg via ORAL

## 2023-10-19 NOTE — Progress Notes (Unsigned)
   10/19/23  CC: followup bladder cancer   HPI: [name] is a 78yo here for followup for bladder cancer.  Blood pressure (!) 146/68, pulse (!) 53. NED. A&Ox3.   No respiratory distress   Abd soft, NT, ND Normal phallus with bilateral descended testicles  Cystoscopy Procedure Note  Patient identification was confirmed, informed consent was obtained, and patient was prepped using Betadine  solution.  Lidocaine  jelly was administered per urethral meatus.     Pre-Procedure: - Inspection reveals a normal caliber ureteral meatus.  Procedure: The flexible cystoscope was introduced without difficulty - No urethral strictures/lesions are present. - {Blank multiple:19197::Enlarged,Surgically absent,Normal} prostate *** - {Blank multiple:19197::Normal,Elevated,Tight} bladder neck - Bilateral ureteral orifices identified - Bladder mucosa  reveals no ulcers, tumors, or lesions - No bladder stones - No trabeculation  Retroflexion shows ***   Post-Procedure: - Patient tolerated the procedure well  Assessment/ Plan: Followup 3 months for cystoscopy  No follow-ups on file.  [name], MD

## 2023-10-20 ENCOUNTER — Encounter: Payer: Self-pay | Admitting: Urology

## 2023-10-20 ENCOUNTER — Other Ambulatory Visit: Payer: Self-pay | Admitting: Cardiovascular Disease

## 2023-10-20 NOTE — Patient Instructions (Signed)

## 2023-10-24 ENCOUNTER — Other Ambulatory Visit: Payer: Self-pay | Admitting: Cardiovascular Disease

## 2023-10-26 ENCOUNTER — Encounter (INDEPENDENT_AMBULATORY_CARE_PROVIDER_SITE_OTHER): Payer: Self-pay | Admitting: Gastroenterology

## 2023-10-26 ENCOUNTER — Other Ambulatory Visit: Payer: Self-pay | Admitting: *Deleted

## 2023-10-27 ENCOUNTER — Ambulatory Visit: Admitting: Family Medicine

## 2023-10-27 ENCOUNTER — Telehealth: Payer: Self-pay | Admitting: Cardiovascular Disease

## 2023-10-27 ENCOUNTER — Encounter: Payer: Self-pay | Admitting: Cardiovascular Disease

## 2023-10-27 VITALS — BP 129/62 | HR 55 | Ht 73.0 in | Wt 213.5 lb

## 2023-10-27 DIAGNOSIS — I10 Essential (primary) hypertension: Secondary | ICD-10-CM

## 2023-10-27 DIAGNOSIS — G7000 Myasthenia gravis without (acute) exacerbation: Secondary | ICD-10-CM | POA: Diagnosis not present

## 2023-10-27 DIAGNOSIS — Z23 Encounter for immunization: Secondary | ICD-10-CM | POA: Diagnosis not present

## 2023-10-27 DIAGNOSIS — J300 Vasomotor rhinitis: Secondary | ICD-10-CM | POA: Diagnosis not present

## 2023-10-27 MED ORDER — IPRATROPIUM BROMIDE 0.06 % NA SOLN: 2.0000 | Freq: Four times a day (QID) | NASAL | 0 refills | Status: AC | PRN

## 2023-10-27 NOTE — Patient Instructions (Signed)
Medication as prescribed.  Follow up in 6 months.  Take care  Dr. Lacinda Axon

## 2023-10-27 NOTE — Telephone Encounter (Signed)
*  STAT* If patient is at the pharmacy, call can be transferred to refill team.   1. Which medications need to be refilled? (please list name of each medication and dose if known)   carvedilol  (COREG ) 6.25 MG tablet   2. Would you like to learn more about the convenience, safety, & potential cost savings by using the [facility]?   3. Are you open to using the [facility] (Type [facility]. ).  4. Which pharmacy/location (including street and city if local pharmacy) is medication to be sent to?  [id number] - , [place] - [address]   5. Do they need a 30 day or 90 day supply?   Daughter Jolynn) stated patient is almost out of his medication.  Patient has appointment scheduled on 10/31 with Dr. Wonda.

## 2023-10-28 MED ORDER — CARVEDILOL 6.25 MG PO TABS: 6.2500 mg | ORAL_TABLET | Freq: Two times a day (BID) | ORAL | 0 refills | Status: DC

## 2023-10-28 NOTE — Telephone Encounter (Signed)
Sent in refill for patient.

## 2023-10-28 NOTE — Telephone Encounter (Signed)
 Orders only

## 2023-10-30 DIAGNOSIS — J300 Vasomotor rhinitis: Secondary | ICD-10-CM | POA: Insufficient documentation

## 2023-10-30 NOTE — Assessment & Plan Note (Signed)
 Improving. Continue close follow up with Neurology.

## 2023-10-30 NOTE — Progress Notes (Signed)
 Subjective:  Patient ID: [name] Rather, male    DOB: [birth year]  Age: 78 y.o. MRN: [id number]  CC:  Follow up   HPI:  78 year old male with the below mentioned medical problems presents for follow up.  He is doing much better at this time. Having improvement in Myasthenia Gravis on Vyvgart.   BP well controlled currently on Norvasc , Coreg .  No chest pain.   Reports ongoing rhinorrhea, particularly with eating.  Needs flu shot today.    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   Iron deficiency anemia 12/14/2021   Barrett's esophagus 01/01/2021   Anemia 01/01/2021   Obstructive sleep apnea on CPAP 05/28/2020   Aortic stenosis 09/24/2011   Coronary artery disease 12/08/2010   Hypothyroidism 09/28/2008   Hyperlipidemia 09/28/2008   Essential hypertension 09/28/2008   Osteoarthritis 09/28/2008    Social Hx   Social History   Socioeconomic History   Marital status: Widowed    Spouse name: Not on file   Number of children: Not on file   Years of education: Not on file   Highest education level: Not on file  Occupational History   Occupation: semi-retired  Tobacco Use   Smoking status: Former    Types: Cigars    Passive exposure: Past   Smokeless tobacco: Never   Tobacco comments:    quit smoking in ~ 1978  Vaping Use   Vaping status: Never Used  Substance and Sexual Activity   Alcohol use: Yes    Alcohol/week: 2.0 standard drinks of alcohol    Types: 2 Cans of beer per week    Comment: occasionally    Drug use: No   Sexual activity: Not Currently  Other Topics Concern   Not on file  Social History Narrative   Pt lives alone    Manages rental property    Social  Drivers of Health   Financial Resource Strain: Low Risk  (08/10/2022)   Overall Financial Resource Strain (CARDIA)    Difficulty of Paying Living Expenses: Not hard at all  Food Insecurity: No Food Insecurity (08/29/2023)   Hunger Vital Sign    Worried About Running Out of Food in the Last Year: Never true    Ran Out of Food in the Last Year: Never true  Transportation Needs: No Transportation Needs (08/29/2023)   PRAPARE - Administrator, Civil Service (Medical): No    Lack of Transportation (Non-Medical): No  Physical Activity: Sufficiently Active (06/04/2022)   Exercise Vital Sign    Days of Exercise per Week: 5 days    Minutes of Exercise per Session: 30 min  Stress: Not on file  Social Connections: Not on file    Review of Systems Per HPI  Objective:  BP 129/62   Pulse (!) 55   Ht 6' 1 (1.854 m)   Wt 213 lb 8 oz (96.8 kg)   BMI 28.17 kg/m      10/27/2023   11:08 AM 10/19/2023    9:40 AM 08/29/2023   10:33 AM  BP/Weight  Systolic BP 129 146 129  Diastolic BP 62 68 57  Wt. (Lbs) 213.5  204.6  BMI 28.17 kg/m2  26.99 kg/m2  Physical Exam Vitals and nursing note reviewed.  Constitutional:      General: He is not in acute distress.    Appearance: Normal appearance.  HENT:     Head: Normocephalic and atraumatic.  Cardiovascular:     Rate and Rhythm: Normal rate and regular rhythm.  Pulmonary:     Effort: Pulmonary effort is normal.     Breath sounds: Normal breath sounds. No wheezing or rales.  Neurological:     Mental Status: He is alert.     Lab Results  Component Value Date   WBC 6.4 08/18/2023   HGB 10.1 (L) 08/18/2023   HCT 32.2 (L) 08/18/2023   PLT 152 08/18/2023   GLUCOSE 119 (H) 08/18/2023   CHOL 150 07/28/2023   TRIG 130 07/28/2023   HDL 57 07/28/2023   LDLCALC 70 07/28/2023   ALT 22 08/18/2023   AST 18 08/18/2023   NA 140 08/18/2023   K 3.6 08/18/2023   CL 102 08/18/2023   CREATININE 1.26 (H) 08/18/2023   BUN 26 (H)  08/18/2023   CO2 27 08/18/2023   TSH 0.056 (L) 07/28/2023   INR 1.0 05/29/2020   HGBA1C 6.4 (H) 07/28/2023     Assessment & Plan:  Essential hypertension Assessment & Plan: Stable. Continue current medications.   Vasomotor rhinitis Assessment & Plan: Atrovent as prescribed.   Orders: -     Ipratropium Bromide; Place 2 sprays into both nostrils 4 (four) times daily as needed for rhinitis.  Dispense: 15 mL; Refill: 0  Myasthenia gravis (HCC) Assessment & Plan: Improving. Continue close follow up with Neurology.   Influenza vaccine needed -     Flu vaccine HIGH DOSE PF(Fluzone Trivalent)    Follow-up:  6 months  Syliva Mee Bluford DO [facility]

## 2023-10-30 NOTE — Assessment & Plan Note (Signed)
Atrovent as prescribed.

## 2023-10-30 NOTE — Assessment & Plan Note (Signed)
 Stable.  Continue current medications.

## 2023-11-06 ENCOUNTER — Encounter: Payer: Self-pay | Admitting: Cardiovascular Disease

## 2023-11-07 NOTE — Telephone Encounter (Signed)
 Got it. Thx Kryestyn!

## 2023-11-11 ENCOUNTER — Ambulatory Visit: Attending: Cardiovascular Disease | Admitting: Cardiovascular Disease

## 2023-11-11 ENCOUNTER — Encounter: Payer: Self-pay | Admitting: Cardiovascular Disease

## 2023-11-11 VITALS — BP 149/62 | HR 59 | Resp 16 | Ht 73.0 in | Wt 222.0 lb

## 2023-11-11 DIAGNOSIS — E785 Hyperlipidemia, unspecified: Secondary | ICD-10-CM

## 2023-11-11 DIAGNOSIS — I25119 Atherosclerotic heart disease of native coronary artery with unspecified angina pectoris: Secondary | ICD-10-CM

## 2023-11-11 DIAGNOSIS — I350 Nonrheumatic aortic (valve) stenosis: Secondary | ICD-10-CM

## 2023-11-11 DIAGNOSIS — I10 Essential (primary) hypertension: Secondary | ICD-10-CM

## 2023-11-11 NOTE — Assessment & Plan Note (Signed)
 Blood pressure is controlled on amlodipine , carvedilol , and isosorbide .

## 2023-11-11 NOTE — Progress Notes (Signed)
 Cardiology Office Note:    Date:  11/11/2023   ID:  Steve, Mack 1945-04-13, MRN [id number]  PCP:  Cook, Jayce G, DO   [facility] Providers Cardiologist:  Ozell Fell, MD     Referring MD: Marvine Rush, MD   Chief Complaint  Patient presents with   Coronary artery disease involving native coronary artery of   Follow-up    History of Present Illness:    [name] is a 78 y.o. male with a hx of CAD and remote CABG in 1997 with a LIMA to LAD and RIMA to RCA.  The patient underwent PCI in 2017 with overlapping drug-eluting stents in the distal RCA through the RIMA graft.  He also had stenting of the left main and native circumflex at that time.  His last heart catheterization in 2018 showed patency of his bypass graft and his stent sites.  The patient is here alone today.  Since I have seen him last, he has been diagnosed with myasthenia gravis.  From a cardiac perspective, he reports no change in symptoms.  He has occasional angina associated with physical exertion.  He reports no progression of symptoms and in fact states that this occurs less often than in the past.  He does admit that he is not as active as he has previously been due to some other medical problems.  He might experience angina with walking up a hill or stairs.  On level ground he states that he does fine.  Symptoms resolved with rest and again remained stable.  He also has mild dyspnea with exertion unchanged over time.  No orthopnea, PND, or leg edema.  No lightheadedness or heart palpitations.   Current Medications: Current Meds  Medication Sig   acetaminophen  (TYLENOL ) 500 MG tablet Take 500 mg by mouth every 6 (six) hours as needed for moderate pain (pain score 4-6).   amLODipine  (NORVASC ) 10 MG tablet Take 1 tablet (10 mg total) by mouth in the morning.   Calcium  Carbonate-Vit D-Min (CALCIUM  1200 PO) Take 1,200 mg by mouth daily.   carvedilol  (COREG ) 6.25 MG tablet Take 1 tablet  (6.25 mg total) by mouth 2 (two) times daily.   cetirizine (ZYRTEC) 10 MG tablet Take 10 mg by mouth every evening.   clopidogrel  (PLAVIX ) 75 MG tablet Take 1 tablet (75 mg total) by mouth in the morning.   Coenzyme Q10 300 MG CAPS Take 300 mg by mouth every evening.   cyanocobalamin  (VITAMIN B12) 1000 MCG tablet Take 1 tablet (1,000 mcg total) by mouth daily.   fenofibrate  160 MG tablet Take 1 tablet (160 mg total) by mouth daily.   gabapentin  (NEURONTIN ) 600 MG tablet Take 1 tablet (600 mg total) by mouth 3 (three) times daily.   GLUCOSAMINE-CHONDROITIN PO Take 1 tablet by mouth every evening.   ipratropium (ATROVENT) 0.06 % nasal spray Place 2 sprays into both nostrils 4 (four) times daily as needed for rhinitis.   isosorbide  mononitrate (IMDUR ) 60 MG 24 hr tablet Take 1.5 tablets (90 mg total) by mouth daily.   levothyroxine  (SYNTHROID ) 150 MCG tablet TAKE 1 TABLET(150 MCG) BY MOUTH DAILY   meclizine  (ANTIVERT ) 25 MG tablet Take 25 mg by mouth every 6 (six) hours as needed.   Multiple Vitamins-Minerals (VITRIUM 50+ ADULT-MULTI PO) Take by mouth.   nitroGLYCERIN  (NITROSTAT ) 0.4 MG SL tablet PLACE 1 TABLET UNDER THE TONGUE EVERY 5 MINUTES AS NEEDED FOR CHEST PAIN   Omega-3 Fatty Acids (OMEGA-3 FISH OIL  PO) Take by mouth.   Polyethyl Glycol-Propyl Glycol (LUBRICANT EYE DROPS) 0.4-0.3 % SOLN Place 1-2 drops into both eyes 3 (three) times daily as needed (dry/irritated eyes.).   predniSONE  (DELTASONE ) 20 MG tablet Take 1 tablet (20 mg total) by mouth daily with breakfast.   pyridostigmine  (MESTINON ) 60 MG tablet Take 0.5-1 tablets (30-60 mg total) by mouth 3 (three) times daily.   traMADol  (ULTRAM ) 50 MG tablet Take 1 tablet (50 mg total) by mouth every 8 (eight) hours.   VYVGART HYTRULO 1000-10000 MG-UNT/5ML SOSY Prefilled Syringe    [DISCONTINUED] aspirin  EC 81 MG tablet Take 81 mg by mouth every evening.     Allergies:   Codeine, Gemtesa  [vibegron ], and Pollen extract   ROS:   Please see  the history of present illness.    All other systems reviewed and are negative.  EKGs/Labs/Other Studies Reviewed:    The following studies were reviewed today: Cardiac Studies & Procedures   ______________________________________________________________________________________________ CARDIAC CATHETERIZATION  CARDIAC CATHETERIZATION 09/08/2016  Conclusion  Mid RCA lesion, 100 %stenosed.  RIMA.  Dist RCA lesion, 0 %stenosed.  A drug eluting .  Ost RPDA lesion, 0 %stenosed.  A drug eluting .  Prox LAD lesion, 100 %stenosed.  LIMA.  Ost LM to LM lesion, 25 %stenosed.  Ost Ramus to Ramus lesion, 25 %stenosed.  The left ventricular systolic function is normal.  LV end diastolic pressure is mildly elevated.  The left ventricular ejection fraction is 55-65% by visual estimate.  1. 2 vessel occlusive CAD 2. Patent stents in the left main and large ramus intermediate branch 3. Patent stents in the distal RCA 4. Patent free RIMA to the distal RCA 5. Patent LIMA to the LAD 6. Normal LV function 7. Mildly elevated LVEDP.  Findings Coronary Findings Diagnostic  Dominance: Right  Left Main The lesion was previously treated.  Left Anterior Descending  Ramus Intermedius The lesion was previously treated.  Right Coronary Artery  Dist RCA lesion with no stenosis was previously treated.  Right Posterior Descending Artery Ost RPDA lesion with no stenosis was previously treated.  RIMA Graft To Mid RCA RIMA. Patent graft - free RIMA  LIMA LIMA Graft To Mid LAD LIMA. LIMA-LAD patent  Intervention  No interventions have been documented.   CARDIAC CATHETERIZATION  CARDIAC CATHETERIZATION 04/10/2015  Conclusion  Mid RCA lesion, 100% stenosed.  RIMA .  Patent graft - free RIMA  Prox LAD lesion, 100% stenosed.  LIMA .  LIMA-LAD patent  Ost LM to LM lesion, 25% stenosed. The lesion was previously treated with a stent (unknown type).  Ost Ramus to  Ramus lesion, 25% stenosed. The lesion was previously treated with a stent (unknown type).  Ost RPDA lesion, 100% stenosed. Post intervention, there is a 0% residual stenosis.  Dist RCA lesion, 99% stenosed. Post intervention, there is a 0% residual stenosis.  1. Severe native vessel CAD with total occlusion of the RCA, total occlusion of the LAD, and patency of the stented segment of the left main into the ramus. 2. Severe stenosis of the distal RCA treated successfully with PCI (drug-eluting stent) 3. Total occlusion of the PDA following distal RCA stenting, likely due to plaque shifting, treated successfully with PCI (DES), but with distal vessel occlusion possibly due to distal vessel dissection (pt asymptomatic)  Continue DAPT. Will transition to Brilinta  tomorrow am. Pt would be a candidate for the Twilight Trial if he meets criteria.  Findings Coronary Findings Diagnostic  Dominance: Right  Left Main The  lesion was previously treated with a stent (unknown type).  Left Anterior Descending  Ramus Intermedius The lesion was previously treated with a stent (unknown type).  Right Coronary Artery  Right Posterior Descending Artery  RIMA Graft To Mid RCA RIMA Patent graft - free RIMA  LIMA LIMA Graft To Mid LAD LIMA LIMA-LAD patent  Intervention  Dist RCA lesion PCI The pre-interventional distal flow is normal (TIMI 3). Pre-stent angioplasty was performed. A drug-eluting stent was placed. Post-stent angioplasty was performed. The post-interventional distal flow is normal (TIMI 3). The intervention was successful. No complications occurred at this lesion. The patient has been on long-term dual antiplatelet therapy with aspirin  and Plavix . Heparin  is used for anticoagulation. A 6 French multipurpose guide catheters used. A cougar wire is advanced across the lesion. The lesion is predilated with a 2.0 mm balloon. The lesion is then stented with a 2.25 x 18 mm resolute DES. After  the lesion is stented, there is a distal plaque shift and the PDA is occluded. The cougar wire is left in place and a whisper wire was advanced across the lesion. I tried to advance a balloon but it would not cross. Wire position is lost in a new whisper wire was used to cross the lesion. I was unable to dilate the PDA with a 2.0 mm balloon. The PDA is then stented in overlapping fashion with the distal RCA stent using a 2.25 x 18 mm resolute DES. The distal PDA is subtotally occluded and there may be dissection distally. The patient was chest pain and symptom-free. I attempted to rewire the PDA and the distal occlusion was so far out that I did not feel comfortable using another balloon. The patient remained stable throughout and had no chest pain. There was 0% residual stenosis at the lesion site. There was 0% residual stenosis at the PDA site. There was TIMI-3 flow through the lesion with distal PDA occlusion likely related to dissection. There is a 0% residual stenosis post intervention.  Ost RPDA lesion PCI There is no pre-interventional antegrade distal flow (TIMI 0). Pre-stent angioplasty was performed. A drug-eluting stent was placed. Post-stent angioplasty was performed. The post-interventional distal flow is normal (TIMI 3). The intervention was successful. At this lesion, a dissection occurred. There is a 0% residual stenosis post intervention.   STRESS TESTS  NM MYOCAR MULTI W/SPECT W 10/12/2021  Narrative   Findings are consistent with prior myocardial infarction with peri-infarct ischemia. The study is intermediate risk.   No ST deviation was noted.   Left ventricular function is normal. End diastolic cavity size is normal.  Small, Mild fixed defect in the inferolateral wall that is mildy worse with stress. C/f small inferolateral scar with mild per-infarct ischemia   ECHOCARDIOGRAM  ECHOCARDIOGRAM COMPLETE 10/09/2021  Narrative ECHOCARDIOGRAM REPORT    Patient Name:    [name] Date of Exam: 10/09/2021 Medical Rec #:  [id number]          Height:       72.0 in Accession #:    [id number]         Weight:       216.8 lb Date of Birth:  1945-08-21          BSA:          2.205 m Patient Age:    76 years           BP:           143/69 mmHg Patient Gender: M  HR:           60 bpm. Exam Location:  [facility]  Procedure: 2D Echo, Cardiac Doppler and Color Doppler  Indications:    Z01.810 (ICD-10-CM) - Preoperative cardiovascular examination  History:        Patient has prior history of Echocardiogram examinations, most recent 04/22/2011. CAD, Prior CABG, Signs/Symptoms:Murmur; Risk Factors:Hypertension and Dyslipidemia. Obstructive sleep apnea on CPAP.  Sonographer:    Aida Pizza RCS Referring Phys: [id number] ELIZABETH PECK  IMPRESSIONS   1. Left ventricular ejection fraction, by estimation, is 65 to 70%. The left ventricle has normal function. The left ventricle has no regional wall motion abnormalities. There is moderate asymmetric left ventricular hypertrophy. Left ventricular diastolic parameters are indeterminate. 2. Right ventricular systolic function is normal. The right ventricular size is normal. Tricuspid regurgitation signal is inadequate for assessing PA pressure. 3. Left atrial size was mildly dilated. 4. Right atrial size was mildly dilated. 5. The mitral valve is grossly normal. No evidence of mitral valve regurgitation. No evidence of mitral stenosis. Moderate mitral annular calcification. 6. The aortic valve is tricuspid. There is mild calcification of the aortic valve. There is mild thickening of the aortic valve. Aortic valve regurgitation is not visualized. Mild aortic valve stenosis. Normal stoke volume index. 7. The inferior vena cava is normal in size with <50% respiratory variability, suggesting right atrial pressure of 8 mmHg.  Comparison(s): Unable to view 2013 study.  FINDINGS Left Ventricle: Left  ventricular ejection fraction, by estimation, is 65 to 70%. The left ventricle has normal function. The left ventricle has no regional wall motion abnormalities. The left ventricular internal cavity size was normal in size. There is moderate asymmetric left ventricular hypertrophy. Left ventricular diastolic parameters are indeterminate.  Right Ventricle: The right ventricular size is normal. No increase in right ventricular wall thickness. Right ventricular systolic function is normal. Tricuspid regurgitation signal is inadequate for assessing PA pressure.  Left Atrium: Left atrial size was mildly dilated.  Right Atrium: Right atrial size was mildly dilated.  Pericardium: There is no evidence of pericardial effusion. Presence of epicardial fat layer.  Mitral Valve: The mitral valve is grossly normal. Moderate mitral annular calcification. No evidence of mitral valve regurgitation. No evidence of mitral valve stenosis.  Tricuspid Valve: The tricuspid valve is normal in structure. Tricuspid valve regurgitation is not demonstrated. No evidence of tricuspid stenosis.  Aortic Valve: The aortic valve is tricuspid. There is mild calcification of the aortic valve. There is mild thickening of the aortic valve. There is mild aortic valve annular calcification. Aortic valve regurgitation is not visualized. Mild aortic stenosis is present. Aortic valve mean gradient measures 13.0 mmHg. Aortic valve peak gradient measures 20.2 mmHg. Aortic valve area, by VTI measures 3.47 cm.  Pulmonic Valve: The pulmonic valve was normal in structure. Pulmonic valve regurgitation is not visualized. No evidence of pulmonic stenosis.  Aorta: The aortic root is normal in size and structure.  Venous: The inferior vena cava is normal in size with less than 50% respiratory variability, suggesting right atrial pressure of 8 mmHg.  IAS/Shunts: No atrial level shunt detected by color flow Doppler.   LEFT VENTRICLE PLAX  2D LVIDd:         5.00 cm   Diastology LVIDs:         3.10 cm   LV e' medial:    8.59 cm/s LV PW:         1.10 cm   LV E/e' medial:  11.9  LV IVS:        1.00 cm   LV e' lateral:   9.01 cm/s LVOT diam:     2.30 cm   LV E/e' lateral: 11.3 LV SV:         179 LV SV Index:   81 LVOT Area:     4.15 cm   RIGHT VENTRICLE RV S prime:     14.30 cm/s TAPSE (M-mode): 1.8 cm  LEFT ATRIUM              Index        RIGHT ATRIUM           Index LA diam:        5.10 cm  2.31 cm/m   RA Area:     25.00 cm LA Vol (A2C):   101.0 ml 45.81 ml/m  RA Volume:   78.10 ml  35.42 ml/m LA Vol (A4C):   68.8 ml  31.20 ml/m LA Biplane Vol: 83.3 ml  37.78 ml/m AORTIC VALVE AV Area (Vmax):    3.16 cm AV Area (Vmean):   3.02 cm AV Area (VTI):     3.47 cm AV Vmax:           225.00 cm/s AV Vmean:          169.000 cm/s AV VTI:            0.515 m AV Peak Grad:      20.2 mmHg AV Mean Grad:      13.0 mmHg LVOT Vmax:         171.00 cm/s LVOT Vmean:        123.000 cm/s LVOT VTI:          0.430 m LVOT/AV VTI ratio: 0.83  AORTA Ao Root diam: 3.60 cm  MITRAL VALVE MV Area (PHT): 2.62 cm     SHUNTS MV Decel Time: 289 msec     Systemic VTI:  0.43 m MV E velocity: 102.00 cm/s  Systemic Diam: 2.30 cm MV A velocity: 77.10 cm/s MV E/A ratio:  1.32  Stanly Leavens MD Electronically signed by Stanly Leavens MD Signature Date/Time: 10/09/2021/4:23:47 PM    Final          ______________________________________________________________________________________________      EKG:        Recent Labs: 02/11/2023: Magnesium 2.2 07/28/2023: TSH 0.056 08/18/2023: ALT 22; BUN 26; Creatinine, Ser 1.26; Hemoglobin 10.1; Platelets 152; Potassium 3.6; Sodium 140  Recent Lipid Panel    Component Value Date/Time   CHOL 150 07/28/2023 1512   TRIG 130 07/28/2023 1512   HDL 57 07/28/2023 1512   CHOLHDL 2.6 07/28/2023 1512   LDLCALC 70 07/28/2023 1512           Physical Exam:    VS:  BP (!)  149/62 (BP Location: Left Arm, Patient Position: Sitting, Cuff Size: Large)   Pulse (!) 59   Resp 16   Ht 6' 1 (1.854 m)   Wt 222 lb (100.7 kg)   SpO2 93%   BMI 29.29 kg/m     Wt Readings from Last 3 Encounters:  11/11/23 222 lb (100.7 kg)  10/27/23 213 lb 8 oz (96.8 kg)  08/29/23 204 lb 9.6 oz (92.8 kg)     GEN:  Well nourished, well developed in no acute distress HEENT: Normal NECK: No JVD; No carotid bruits LYMPHATICS: No lymphadenopathy CARDIAC: RRR, 2/6 early peaking ejection murmur at the right upper sternal border RESPIRATORY:  Clear to auscultation without  rales, wheezing or rhonchi  ABDOMEN: Soft, non-tender, non-distended MUSCULOSKELETAL:  No edema; No deformity  SKIN: Warm and dry NEUROLOGIC:  Alert and oriented x 3 PSYCHIATRIC:  Normal affect   Assessment & Plan Essential hypertension Blood pressure is controlled on amlodipine , carvedilol , and isosorbide . Coronary artery disease involving native coronary artery of native heart with angina pectoris Stable, CCS functional class II symptoms on antianginal therapy with amlodipine , carvedilol , and isosorbide .  Continue current management.  Patient can STOP aspirin  and should remain on clopidogrel  75 mg daily. Hyperlipidemia LDL goal <70 The patient is treated with fenofibrate .  Patient previously on atorvastatin .  It may have been stopped because of his myasthenia.  His recent LDL was 70.  Will continue current management. Nonrheumatic aortic valve stenosis The patient has mild aortic stenosis by most recent echo and his exam remains consistent with mild aortic stenosis.  I have recommended a surveillance echocardiogram next year prior to his return office visit which will be a 3-year follow-up study.            Medication Adjustments/Labs and Tests Ordered: Current medicines are reviewed at length with the patient today.  Concerns regarding medicines are outlined above.  Orders Placed This Encounter  Procedures    ECHOCARDIOGRAM COMPLETE   No orders of the defined types were placed in this encounter.   Patient Instructions  Medication Instructions:  STOP Aspirin   *If you need a refill on your cardiac medications before your next appointment, please call your pharmacy*  Lab Work: None ordered today. If you have labs (blood work) drawn today and your tests are completely normal, you will receive your results only by: MyChart Message (if you have MyChart) OR A paper copy in the mail If you have any lab test that is abnormal or we need to change your treatment, we will call you to review the results.  Testing/Procedures: Your physician has requested that you have an echocardiogram prior to your 1 year follow-up. Echocardiography is a painless test that uses sound waves to create images of your heart. It provides your doctor with information about the size and shape of your heart and how well your heart's chambers and valves are working. This procedure takes approximately one hour. There are no restrictions for this procedure. Please do NOT wear cologne, perfume, aftershave, or lotions (deodorant is allowed). Please arrive 15 minutes prior to your appointment time.  Please note: We ask at that you not bring children with you during ultrasound (echo/ vascular) testing. Due to room size and safety concerns, children are not allowed in the ultrasound rooms during exams. Our front office staff cannot provide observation of children in our lobby area while testing is being conducted. An adult accompanying a patient to their appointment will only be allowed in the ultrasound room at the discretion of the ultrasound technician under special circumstances. We apologize for any inconvenience.   Follow-Up: At [facility], you and your health needs are our priority.  As part of our continuing mission to provide you with exceptional heart care, our providers are all part of one team.  This team includes  your primary Cardiologist (physician) and Advanced Practice Providers or APPs (Physician Assistants and Nurse Practitioners) who all work together to provide you with the care you need, when you need it.  Your next appointment:   1 year(s)  Provider:   Ozell Fell, MD     Signed, Ozell Fell, MD  11/11/2023 2:48 PM    [facility]

## 2023-11-11 NOTE — Assessment & Plan Note (Signed)
 The patient has mild aortic stenosis by most recent echo and his exam remains consistent with mild aortic stenosis.  I have recommended a surveillance echocardiogram next year prior to his return office visit which will be a 3-year follow-up study.

## 2023-11-11 NOTE — Patient Instructions (Signed)
 Medication Instructions:  STOP Aspirin   *If you need a refill on your cardiac medications before your next appointment, please call your pharmacy*  Lab Work: None ordered today. If you have labs (blood work) drawn today and your tests are completely normal, you will receive your results only by: MyChart Message (if you have MyChart) OR A paper copy in the mail If you have any lab test that is abnormal or we need to change your treatment, we will call you to review the results.  Testing/Procedures: Your physician has requested that you have an echocardiogram prior to your 1 year follow-up. Echocardiography is a painless test that uses sound waves to create images of your heart. It provides your doctor with information about the size and shape of your heart and how well your heart's chambers and valves are working. This procedure takes approximately one hour. There are no restrictions for this procedure. Please do NOT wear cologne, perfume, aftershave, or lotions (deodorant is allowed). Please arrive 15 minutes prior to your appointment time.  Please note: We ask at that you not bring children with you during ultrasound (echo/ vascular) testing. Due to room size and safety concerns, children are not allowed in the ultrasound rooms during exams. Our front office staff cannot provide observation of children in our lobby area while testing is being conducted. An adult accompanying a patient to their appointment will only be allowed in the ultrasound room at the discretion of the ultrasound technician under special circumstances. We apologize for any inconvenience.   Follow-Up: At [facility], you and your health needs are our priority.  As part of our continuing mission to provide you with exceptional heart care, our providers are all part of one team.  This team includes your primary Cardiologist (physician) and Advanced Practice Providers or APPs (Physician Assistants and Nurse  Practitioners) who all work together to provide you with the care you need, when you need it.  Your next appointment:   1 year(s)  Provider:   Ozell Fell, MD

## 2023-11-11 NOTE — Assessment & Plan Note (Signed)
 Stable, CCS functional class II symptoms on antianginal therapy with amlodipine , carvedilol , and isosorbide .  Continue current management.  Patient can STOP aspirin  and should remain on clopidogrel  75 mg daily.

## 2023-11-24 ENCOUNTER — Other Ambulatory Visit: Payer: Self-pay | Admitting: Diagnostic Neuroimaging

## 2023-11-24 ENCOUNTER — Telehealth: Payer: Self-pay | Admitting: *Deleted

## 2023-11-24 NOTE — Telephone Encounter (Signed)
 Received fax that pts authorization fro Vyvgart will expire 01/11/2024, need notes from recent visit.  Pt had 08-15-2023, vyvgart started 09-16-2023.  Will need sooner appt to address this.  I made a VV for 12-26-2023 at 1400 to address this.  Sent mychart to pt.

## 2023-11-28 NOTE — Telephone Encounter (Signed)
 Last appt date : 08/15/23 next appt date : 01/31/24 last refill date : 11/09/2023  - pyridostigmine  30mg  twice a day 1-2 WEEKS; then increase to 60mg  twice a day or three times a day as tolerated   - continue prednisone  20mg  daily; will plan to taper off after starting vyvgart (home injection) (please advise on prednisone  request)

## 2023-11-28 NOTE — Telephone Encounter (Signed)
 Duplicate

## 2023-12-01 ENCOUNTER — Encounter: Payer: Self-pay | Admitting: Cardiovascular Disease

## 2023-12-01 MED ORDER — CARVEDILOL 6.25 MG PO TABS: 6.2500 mg | ORAL_TABLET | Freq: Two times a day (BID) | ORAL | 3 refills | Status: AC

## 2023-12-04 ENCOUNTER — Other Ambulatory Visit: Payer: Self-pay | Admitting: Cardiovascular Disease

## 2023-12-15 ENCOUNTER — Other Ambulatory Visit: Payer: Self-pay | Admitting: Family Medicine

## 2023-12-15 DIAGNOSIS — M545 Low back pain, unspecified: Secondary | ICD-10-CM

## 2023-12-23 ENCOUNTER — Other Ambulatory Visit: Payer: Self-pay | Admitting: Cardiovascular Disease

## 2023-12-23 DIAGNOSIS — I25110 Atherosclerotic heart disease of native coronary artery with unstable angina pectoris: Secondary | ICD-10-CM

## 2023-12-26 ENCOUNTER — Telehealth: Admitting: Diagnostic Neuroimaging

## 2023-12-28 ENCOUNTER — Other Ambulatory Visit: Payer: Self-pay | Admitting: Diagnostic Neuroimaging

## 2023-12-29 ENCOUNTER — Encounter: Payer: Self-pay | Admitting: Diagnostic Neuroimaging

## 2023-12-29 ENCOUNTER — Other Ambulatory Visit: Payer: Self-pay | Admitting: Family Medicine

## 2023-12-29 ENCOUNTER — Telehealth: Admitting: Diagnostic Neuroimaging

## 2023-12-29 ENCOUNTER — Encounter (INDEPENDENT_AMBULATORY_CARE_PROVIDER_SITE_OTHER): Payer: Self-pay | Admitting: *Deleted

## 2023-12-29 DIAGNOSIS — G7000 Myasthenia gravis without (acute) exacerbation: Secondary | ICD-10-CM | POA: Diagnosis not present

## 2023-12-29 DIAGNOSIS — G8929 Other chronic pain: Secondary | ICD-10-CM

## 2023-12-29 MED ORDER — PYRIDOSTIGMINE BROMIDE 60 MG PO TABS: 60.0000 mg | ORAL_TABLET | Freq: Three times a day (TID) | ORAL | 4 refills | Status: AC

## 2023-12-29 NOTE — Progress Notes (Signed)
 [facility]  PATIENT: [name] DOB: Nov 28, 1945  REFERRING CLINICIAN: Cook, Jayce G, DO HISTORY FROM: patient and daughter REASON FOR VISIT: follow up   HISTORICAL  CHIEF COMPLAINT:  No chief complaint on file.   HISTORY OF PRESENT ILLNESS:   UPDATE (12/29/23, VRP): Since last visit, doing better. Now on 3rd cycle of vygart injections (4 weeks on, 4 weeks off). Overall doing better. MG-ADL score today is 5.   UPDATE (08/15/23, VRP): Since last visit, drooping eyelids and double vision better. But having some weight gait, dysphagia, dysarthria, irritability.   UPDATE (05/09/23, VRP): Since last visit, symptoms are stable, but not improved. Still with double vision and drooping vision. Some mild issues with arms and legs.   PRIOR HPI (03/15/23): 78 year old male here for evaluation of double vision and slurred speech.  Patient presented to hospital on 03/11/2023 for several months of intermittent double vision, lip swelling, numb feeling, tongue swelling, gait difficulty, slurred speech, fuzzy thoughts, 30 pound weight loss.  Patient had MRI of the brain which was unremarkable.  CTA of the head and neck showed possible left vertebral artery dissection extending from V1-V3 segment.  Also found to have other bilateral carotid bifurcation stenoses.  He was started on aspirin  and Plavix  and recommended to follow-up in neurology clinic.  Myasthenia gravis antibody testing was sent off but results not back yet.    REVIEW OF SYSTEMS: Full 14 system review of systems performed and negative with exception of: as per HPI.  ALLERGIES: Allergies  Allergen Reactions   Codeine Nausea Only   Gemtesa  [Vibegron ] Other (See Comments)    Patient reports swelling of throat, lips and dizziness when taking this medication   Pollen Extract Other (See Comments)    Seasonal    HOME MEDICATIONS: Outpatient Medications Prior to Visit  Medication Sig Dispense Refill    acetaminophen  (TYLENOL ) 500 MG tablet Take 500 mg by mouth every 6 (six) hours as needed for moderate pain (pain score 4-6).     amLODipine  (NORVASC ) 10 MG tablet Take 1 tablet (10 mg total) by mouth in the morning. 90 tablet 3   Calcium  Carbonate-Vit D-Min (CALCIUM  1200 PO) Take 1,200 mg by mouth daily.     carvedilol  (COREG ) 6.25 MG tablet Take 1 tablet (6.25 mg total) by mouth 2 (two) times daily. 180 tablet 3   cetirizine (ZYRTEC) 10 MG tablet Take 10 mg by mouth every evening.     clopidogrel  (PLAVIX ) 75 MG tablet Take 1 tablet (75 mg total) by mouth in the morning. 30 tablet 1   Coenzyme Q10 300 MG CAPS Take 300 mg by mouth every evening.     cyanocobalamin  (VITAMIN B12) 1000 MCG tablet Take 1 tablet (1,000 mcg total) by mouth daily. 90 tablet 3   fenofibrate  160 MG tablet Take 1 tablet (160 mg total) by mouth daily. 90 tablet 1   gabapentin  (NEURONTIN ) 600 MG tablet Take 1 tablet (600 mg total) by mouth 3 (three) times daily. 270 tablet 3   GLUCOSAMINE-CHONDROITIN PO Take 1 tablet by mouth every evening.     ipratropium (ATROVENT ) 0.06 % nasal spray Place 2 sprays into both nostrils 4 (four) times daily as needed for rhinitis. 15 mL 0   isosorbide  mononitrate (IMDUR ) 60 MG 24 hr tablet Take 1.5 tablets (90 mg total) by mouth daily. 135 tablet 3   levothyroxine  (SYNTHROID ) 150 MCG tablet TAKE 1 TABLET(150 MCG) BY MOUTH DAILY 90 tablet 0   meclizine  (ANTIVERT ) 25 MG  tablet Take 25 mg by mouth every 6 (six) hours as needed.     Multiple Vitamins-Minerals (VITRIUM 50+ ADULT-MULTI PO) Take by mouth.     nitroGLYCERIN  (NITROSTAT ) 0.4 MG SL tablet PLACE 1 TABLET UNDER THE TONGUE EVERY 5 MINUTES AS NEEDED FOR CHEST PAIN 25 tablet 0   Omega-3 Fatty Acids (OMEGA-3 FISH OIL PO) Take by mouth.     Polyethyl Glycol-Propyl Glycol (LUBRICANT EYE DROPS) 0.4-0.3 % SOLN Place 1-2 drops into both eyes 3 (three) times daily as needed (dry/irritated eyes.).     pyridostigmine  (MESTINON ) 60 MG tablet TAKE 1/2-1  TABLET BY MOUTH THREE TIMES DAILY 270 tablet 1   traMADol  (ULTRAM ) 50 MG tablet Take 1 tablet (50 mg total) by mouth every 8 (eight) hours. 90 tablet 3   VYVGART HYTRULO 1000-10000 MG-UNT/5ML SOSY Prefilled Syringe      predniSONE  (DELTASONE ) 20 MG tablet TAKE 1 TABLET(20 MG) BY MOUTH DAILY WITH BREAKFAST 30 tablet 6   No facility-administered medications prior to visit.    PAST MEDICAL HISTORY: Past Medical History:  Diagnosis Date   Anginal pain    Arthritis    all over   Bladder cancer [facility])    chemo/radiation   Bladder tumor    Chronic lower back pain    Coronary artery disease    a. s/p CABG x 2 (LIMA->LAD, RIMA->RCA);  b. s/p multiple PCI's to Ramus;  c. 08/2011 Cath/PCI: LM 70% into ramus with 70-80% there->treated wtih 3.5x18 Xience Xpedition DES, LCX  nonobs, RCA occluded.  RIMA & LIMA patent, EF 55-65%   DJD (degenerative joint disease)    GERD (gastroesophageal reflux disease)    History of gout    Hyperlipidemia    Hypertension    Hypothyroidism    Neuropathy    OSA on CPAP    Prostate cancer (HCC)     PAST SURGICAL HISTORY: Past Surgical History:  Procedure Laterality Date   ABDOMINAL HERNIA REPAIR     BIOPSY  11/26/2015   Procedure: BIOPSY;  Surgeon: Claudis RAYMOND Rivet, MD;  [facility];  Service: Endoscopy;;  gastric esophagus   BIOPSY  01/20/2021   Procedure: BIOPSY;  Surgeon: Eartha Angelia Sieving, MD;  [facility];  Service: Gastroenterology;;   BIOPSY  10/28/2021   Procedure: BIOPSY;  Surgeon: Eartha Angelia Sieving, MD;  [facility];  Service: Gastroenterology;;   BLADDER TUMOR EXCISION     BROW LIFT Bilateral 04/27/2019   Procedure: BILATERAL BLEPHAROPLASTY;  Surgeon: Harrie Agent, MD;  Location: [facility];  Service: Ophthalmology;  Laterality: Bilateral;   CARDIAC CATHETERIZATION N/A 04/10/2015   Procedure: Left Heart Cath and Cors/Grafts Angiography;  Surgeon: Ozell Fell, MD;  Location: [facility] INVASIVE CV  LAB;  Service: Cardiovascular;  Laterality: N/A;   CARDIAC CATHETERIZATION N/A 04/10/2015   Procedure: Coronary Stent Intervention;  Surgeon: Ozell Fell, MD;  Location: [facility] INVASIVE CV LAB;  Service: Cardiovascular;  Laterality: N/A;   CATARACT EXTRACTION W/PHACO Right 06/23/2015   Procedure: CATARACT EXTRACTION PHACO AND INTRAOCULAR LENS PLACEMENT RIGHT EYE; CDE:  7.08;  Surgeon: Cherene Mania, MD;  Location: [facility];  Service: Ophthalmology;  Laterality: Right;   CATARACT EXTRACTION W/PHACO Left 08/25/2015   Procedure: CATARACT EXTRACTION PHACO AND INTRAOCULAR LENS PLACEMENT LEFT EYE; CDE:  8.18;  Surgeon: Cherene Mania, MD;  Location: [facility];  Service: Ophthalmology;  Laterality: Left;   COLONOSCOPY N/A 12/28/2012   Procedure: COLONOSCOPY;  Surgeon: Claudis RAYMOND Rivet, MD;  [facility];  Service:  Endoscopy;  Laterality: N/A;  830 rescheduled   COLONOSCOPY WITH PROPOFOL  N/A 01/20/2021   Procedure: COLONOSCOPY WITH PROPOFOL ;  Surgeon: Eartha Angelia Sieving, MD;  [facility];  Service: Gastroenterology;  Laterality: N/A;  11:05   CORONARY ANGIOPLASTY WITH STENT PLACEMENT  2013; 04/10/2015   this makes me a total of 7 (04/10/2015)   CORONARY ARTERY BYPASS GRAFT  1997   with (LIMA)   ESOPHAGEAL DILATION N/A 11/26/2015   Procedure: ESOPHAGEAL DILATION;  Surgeon: Claudis RAYMOND Rivet, MD;  [facility];  Service: Endoscopy;  Laterality: N/A;   ESOPHAGEAL DILATION N/A 05/23/2017   Procedure: ESOPHAGEAL DILATION;  Surgeon: Rivet Claudis RAYMOND, MD;  [facility];  Service: Endoscopy;  Laterality: N/A;   ESOPHAGEAL DILATION N/A 06/16/2017   Procedure: ESOPHAGEAL DILATION;  Surgeon: Rivet Claudis RAYMOND, MD;  [facility];  Service: Endoscopy;  Laterality: N/A;   ESOPHAGEAL DILATION  10/28/2021   Procedure: ESOPHAGEAL DILATION;  Surgeon: Eartha Angelia Sieving, MD;  [facility];  Service: Gastroenterology;;   ESOPHAGOGASTRODUODENOSCOPY N/A  11/26/2015   Procedure: ESOPHAGOGASTRODUODENOSCOPY (EGD);  Surgeon: Claudis RAYMOND Rivet, MD;  [facility];  Service: Endoscopy;  Laterality: N/A;  1:25   ESOPHAGOGASTRODUODENOSCOPY N/A 05/23/2017   Procedure: ESOPHAGOGASTRODUODENOSCOPY (EGD);  Surgeon: Rivet Claudis RAYMOND, MD;  [facility];  Service: Endoscopy;  Laterality: N/A;  1:15   ESOPHAGOGASTRODUODENOSCOPY N/A 06/16/2017   Procedure: ESOPHAGOGASTRODUODENOSCOPY (EGD);  Surgeon: Rivet Claudis RAYMOND, MD;  [facility];  Service: Endoscopy;  Laterality: N/A;  1240   ESOPHAGOGASTRODUODENOSCOPY (EGD) WITH PROPOFOL  N/A 01/20/2021   Procedure: ESOPHAGOGASTRODUODENOSCOPY (EGD) WITH PROPOFOL ;  Surgeon: Eartha Angelia Sieving, MD;  [facility];  Service: Gastroenterology;  Laterality: N/A;   ESOPHAGOGASTRODUODENOSCOPY (EGD) WITH PROPOFOL  N/A 10/28/2021   Procedure: ESOPHAGOGASTRODUODENOSCOPY (EGD) WITH PROPOFOL ;  Surgeon: Eartha Angelia Sieving, MD;  [facility];  Service: Gastroenterology;  Laterality: N/A;  215 ASA 2, pt will arrive at 10:45   GANGLION CYST EXCISION Right 01/25/2023   Procedure: REMOVAL GANGLION OF RIGHT WRIST;  Surgeon: Murrell Drivers, MD;  Location:  SURGERY CENTER;  Service: Orthopedics;  Laterality: Right;   HERNIA REPAIR Left    IR IMAGING GUIDED PORT INSERTION  02/24/2022   KNEE CARTILAGE SURGERY Bilateral    LEFT HEART CATH AND CORS/GRAFTS ANGIOGRAPHY N/A 09/08/2016   Procedure: LEFT HEART CATH AND CORS/GRAFTS ANGIOGRAPHY;  Surgeon: Jordan, Peter M, MD;  Location: [facility] INVASIVE CV LAB;  Service: Cardiovascular;  Laterality: N/A;   LEFT HEART CATHETERIZATION WITH CORONARY ANGIOGRAM N/A 09/08/2011   Procedure: LEFT HEART CATHETERIZATION WITH CORONARY ANGIOGRAM;  Surgeon: Ozell Fell, MD;  Location: [facility] CATH LAB;  Service: Cardiovascular;  Laterality: N/A;   PERCUTANEOUS CORONARY STENT INTERVENTION (PCI-S) Right 09/08/2011   Procedure: PERCUTANEOUS CORONARY STENT  INTERVENTION (PCI-S);  Surgeon: Ozell Fell, MD;  Location: [facility] CATH LAB;  Service: Cardiovascular;  Laterality: Right;   POLYPECTOMY  01/20/2021   Procedure: POLYPECTOMY;  Surgeon: Eartha Angelia Sieving, MD;  [facility];  Service: Gastroenterology;;   ROBOT ASSISTED LAPAROSCOPIC RADICAL PROSTATECTOMY      FAMILY HISTORY: Family History  Problem Relation Age of Onset   Stroke Mother 73       died   Aneurysm Mother    Cancer Father 19       died    SOCIAL HISTORY: Social History   Socioeconomic History   Marital status: Widowed    Spouse name: Not on file  Number of children: Not on file   Years of education: Not on file   Highest education level: Not on file  Occupational History   Occupation: semi-retired  Tobacco Use   Smoking status: Former    Types: Cigars    Passive exposure: Past   Smokeless tobacco: Never   Tobacco comments:    quit smoking in ~ 1978  Vaping Use   Vaping status: Never Used  Substance and Sexual Activity   Alcohol use: Yes    Alcohol/week: 2.0 standard drinks of alcohol    Types: 2 Cans of beer per week    Comment: occasionally    Drug use: No   Sexual activity: Not Currently  Other Topics Concern   Not on file  Social History Narrative   Pt lives alone    Manages rental property    Social Drivers of Health   Tobacco Use: Medium Risk (11/11/2023)   Patient History    Smoking Tobacco Use: Former    Smokeless Tobacco Use: Never    Passive Exposure: Past  Physicist, Medical Strain: Low Risk (08/10/2022)   Overall Financial Resource Strain (CARDIA)    Difficulty of Paying Living Expenses: Not hard at all  Food Insecurity: No Food Insecurity (08/29/2023)   Epic    Worried About Programme Researcher, Broadcasting/film/video in the Last Year: Never true    Ran Out of Food in the Last Year: Never true  Transportation Needs: No Transportation Needs (08/29/2023)   Epic    Lack of Transportation (Medical): No    Lack of Transportation  (Non-Medical): No  Physical Activity: Sufficiently Active (06/04/2022)   Exercise Vital Sign    Days of Exercise per Week: 5 days    Minutes of Exercise per Session: 30 min  Stress: Not on file  Social Connections: Not on file  Intimate Partner Violence: Not At Risk (08/29/2023)   Epic    Fear of Current or Ex-Partner: No    Emotionally Abused: No    Physically Abused: No    Sexually Abused: No  Depression (PHQ2-9): Low Risk (08/29/2023)   Depression (PHQ2-9)    PHQ-2 Score: 0  Alcohol Screen: Not on file  Housing: Low Risk (08/29/2023)   Epic    Unable to Pay for Housing in the Last Year: No    Number of Times Moved in the Last Year: 0    Homeless in the Last Year: No  Utilities: Not At Risk (08/29/2023)   Epic    Threatened with loss of utilities: No  Health Literacy: Not on file     PHYSICAL EXAM  GENERAL EXAM/CONSTITUTIONAL: Vitals:  There were no vitals filed for this visit.  There is no height or weight on file to calculate BMI. Wt Readings from Last 3 Encounters:  11/11/23 222 lb (100.7 kg)  10/27/23 213 lb 8 oz (96.8 kg)  08/29/23 204 lb 9.6 oz (92.8 kg)   Patient is in no distress; well developed, nourished and groomed; neck is supple  CARDIOVASCULAR: Examination of carotid arteries is normal; no carotid bruits Regular rate and rhythm, no murmurs Examination of peripheral vascular system by observation and palpation is normal  EYES: Ophthalmoscopic exam of optic discs and posterior segments is normal; no papilledema or hemorrhages No results found.   MUSCULOSKELETAL: Gait, strength, tone, movements noted in Neurologic exam below  NEUROLOGIC: MENTAL STATUS:      No data to display         awake, alert, oriented to person,  place and time recent and remote memory intact normal attention and concentration language fluent, comprehension intact, naming intact fund of knowledge appropriate  CRANIAL NERVE:  2nd - no papilledema on fundoscopic  exam 2nd, 3rd, 4th, 6th - pupils equal and reactive to light, visual fields full to confrontation, extraocular muscles intact, no nystagmus 5th - facial sensation symmetric 7th - facial strength symmetric 8th - hearing intact 9th - palate elevates symmetrically, uvula midline 11th - shoulder shrug symmetric 12th - tongue protrusion midline NASAL SPEECH; MILD DYSARTHRIA  MOTOR:  normal bulk and tone, full strength in the BUE, BLE  SENSORY:  normal and symmetric to light touch  COORDINATION:  finger-nose-finger, fine finger movements normal  REFLEXES:  deep tendon reflexes TRACE and symmetric  GAIT/STATION:  narrow based gait     DIAGNOSTIC DATA (LABS, IMAGING, TESTING) - I reviewed patient records, labs, notes, testing and imaging myself where available.  Lab Results  Component Value Date   WBC 6.4 08/18/2023   HGB 10.1 (L) 08/18/2023   HCT 32.2 (L) 08/18/2023   MCV 109.2 (H) 08/18/2023   PLT 152 08/18/2023      Component Value Date/Time   NA 140 08/18/2023 0914   NA 139 07/28/2023 1512   K 3.6 08/18/2023 0914   CL 102 08/18/2023 0914   CO2 27 08/18/2023 0914   GLUCOSE 119 (H) 08/18/2023 0914   BUN 26 (H) 08/18/2023 0914   BUN 39 (H) 07/28/2023 1512   CREATININE 1.26 (H) 08/18/2023 0914   CREATININE 1.69 (H) 12/14/2021 1124   CALCIUM  9.1 08/18/2023 0914   PROT 6.4 (L) 08/18/2023 0914   PROT 6.2 07/28/2023 1512   ALBUMIN 3.6 08/18/2023 0914   ALBUMIN 4.2 07/28/2023 1512   AST 18 08/18/2023 0914   ALT 22 08/18/2023 0914   ALKPHOS 34 (L) 08/18/2023 0914   BILITOT 0.6 08/18/2023 0914   BILITOT 0.3 07/28/2023 1512   GFRNONAA 58 (L) 08/18/2023 0914   GFRAA >60 09/08/2016 1956   Lab Results  Component Value Date   CHOL 150 07/28/2023   HDL 57 07/28/2023   LDLCALC 70 07/28/2023   TRIG 130 07/28/2023   CHOLHDL 2.6 07/28/2023   Lab Results  Component Value Date   HGBA1C 6.4 (H) 07/28/2023   Lab Results  Component Value Date   VITAMINB12 189  02/17/2023   Lab Results  Component Value Date   TSH 0.056 (L) 07/28/2023      Component Ref Range & Units (hover) 03/15/23   AChR Binding Ab, Serum 70.10 High       03/11/23 CTA head / neck 1. Abnormal soft tissue along the wall of the V1 segment left vertebral artery with multifocal irregularity of the vessel from the V1 segment to the V3 segment. Focal occlusion of the distal V3 segment just prior to entering the dura. Findings concerning for vertebral artery dissection. 2. Severe (70% or greater) stenosis of the right carotid bifurcation. 3. Moderate (50-69%) stenosis of the left carotid bifurcation. 4. Moderate stenosis of the cavernous and ophthalmic right ICA. 5. No intracranial large vessel occlusion. 6. No acute intracranial abnormality. Mild chronic microvascular ischemic changes. 7. Prominent anterior endplate osteophytes in the upper thoracic spine which likely compress the esophagus. Associated patulous appearance of the cervical esophagus above this level.  08/18/23 CTA head  1. Multifocal areas of left vertebral artery stenosis are unchanged from the prior study from March 11, 2023 2. Bilateral calcific carotid plaque. The degree of stenosis on the right is  difficult to measure due to the calcium . Less than 50% on the left 3. Stenosis of the left vertebral artery intracranially just proximal to the left PICA unchanged. Otherwise no significant intracranial disease    ASSESSMENT AND PLAN  78 y.o. year old male here with:  Dx:  1. Myasthenia gravis (HCC)      PLAN:  GENERALIZED MYASTHENIA GRAVIS --> FLUCTUATING, FATIGABLE PTOSIS, DOUBLE VISION, DYSARTHRIA, ARM AND LEG WEAKNESS (since ~Jan 2025)  - signs and symptoms consistent with myasthenia gravis  - current MG-ADL score 5 (12/26/23)  - continue pyridostigmine  60mg  three times a day as tolerated  - continue vyvgart hytrulo injectiosn (4 weeks on, 4 weeks off)  - plan to taper prednisone   (15mg  daily x 2 weeks, then 10mg  daily x 2 weeks, then 5mg  daily until further notice)  - continue prilosec / protonix   - continue calcium  (1500 mg per day) and vitamin D  (400 to 800 international units per day)    LEFT VERTEBRAL ARTERY DISSECTION (appears to be incidental finding as fluctuating fatigable symptoms not consistent with this localization) - completed aspirin  81 + plavix  75mg  daily x 6 months; now on clopidogrel  75mg  daily alone - continue medical mgmt for intracranial atherosclerosis (BP, lipid control)   Meds ordered this encounter  Medications   predniSONE  (DELTASONE ) 5 MG tablet    Sig: Take 3 tablets (15 mg total) by mouth daily for 14 days, THEN 2 tablets (10 mg total) daily for 14 days, THEN 1 tablet (5 mg total) daily.    Dispense:  130 tablet    Refill:  0   Return in about 4 months (around 04/28/2024).  Virtual Visit via Video Note  I connected with [name] on 12/29/2023 at  2:00 PM EST by a video enabled telemedicine application and verified that I am speaking with the correct person using two identifiers.   I discussed the limitations of evaluation and management by telemedicine and the availability of in person appointments. The patient expressed understanding and agreed to proceed.  Patient is at home and I am at the office.   I spent 25 minutes of face-to-face and non-face-to-face time with patient.  This included previsit chart review, lab review, study review, order entry, electronic health record documentation, patient education.      Steve FABIENE HANLON, MD 12/29/2023, 2:29 PM Certified in Neurology, Neurophysiology and Neuroimaging  [facility] [address] [place], [place] [address] [phone]

## 2023-12-30 NOTE — Progress Notes (Signed)
 Faxed to [facility] the OV per there request [phone]. (12pgs).

## 2024-01-08 ENCOUNTER — Encounter: Payer: Self-pay | Admitting: Diagnostic Neuroimaging

## 2024-01-11 ENCOUNTER — Telehealth: Payer: Self-pay

## 2024-01-11 NOTE — Telephone Encounter (Signed)
 Copied from CRM #[id number]. Topic: Clinical - Medical Advice >> Jan 11, 2024  9:20 AM Thersia BROCKS wrote: Reason for CRM: Patient called in regarding congestion states it feels like sinus , would like to know what he needs to take to get some relief would like a callback regarding this

## 2024-01-13 ENCOUNTER — Other Ambulatory Visit: Payer: Self-pay | Admitting: Family Medicine

## 2024-01-13 ENCOUNTER — Telehealth: Payer: Self-pay | Admitting: Neurology

## 2024-01-13 NOTE — Telephone Encounter (Signed)
 Patient's  daughter called on call physician, patient mis-read prednisone  tapering instruction, drop his dosage from prednisone  20mg  to 5mg  daily, instead of tapering down with 5mg  decrement.  He has some recurrent symptoms of generalized weakness, fatigue, slurred speech.  Advised patient to go back on previous prednisone  dosage  20mg  daily x 2 weeks, if he bounce back to previous functional level, may restart tapering.  If not doing well, call office for guidance.

## 2024-01-15 ENCOUNTER — Telehealth

## 2024-01-15 ENCOUNTER — Other Ambulatory Visit: Payer: Self-pay

## 2024-01-15 ENCOUNTER — Ambulatory Visit
Admission: EM | Admit: 2024-01-15 | Discharge: 2024-01-15 | Disposition: A | Discharge status: Home / Self Care | Attending: Nurse Practitioner | Admitting: Nurse Practitioner

## 2024-01-15 DIAGNOSIS — R059 Cough, unspecified: Secondary | ICD-10-CM

## 2024-01-15 DIAGNOSIS — R079 Chest pain, unspecified: Secondary | ICD-10-CM

## 2024-01-15 DIAGNOSIS — J22 Unspecified acute lower respiratory infection: Secondary | ICD-10-CM

## 2024-01-15 MED ORDER — AMOXICILLIN-POT CLAVULANATE 875-125 MG PO TABS: 1.0000 | ORAL_TABLET | Freq: Two times a day (BID) | ORAL | 0 refills | Status: DC

## 2024-01-15 NOTE — ED Triage Notes (Signed)
 Pt states cough and chest congestion for the past 10-12 days. States he has been taking OTC cold medicine.

## 2024-01-15 NOTE — Discharge Instructions (Signed)
 Take medication as prescribed. Continue Mucinex as needed for cough.  Also begin using your Flonase daily as directed. You may take over-the-counter Tylenol  as needed for pain, fever, or general discomfort. You may continue using normal saline nasal spray or nasal rinses throughout the day for nasal congestion or runny nose. For your cough, recommend use of a humidifier in your bedroom at nighttime during sleep and sleeping elevated on pillows while symptoms persist. As discussed, recommend follow-up with your primary care physician for further evaluation or reevaluation within the next 7 to 10 days. Follow-up as needed.

## 2024-01-15 NOTE — ED Provider Notes (Addendum)
 " [facility]    CSN: [id number] Arrival date & time: 01/15/24  1238      History   Chief Complaint Chief Complaint  Patient presents with   Cough    HPI [name] is a 79 y.o. male.   The history is provided by the patient.   Patient presents with a 10 to 12-day history of cough, head congestion, chest congestion, nasal congestion, and generalized fatigue.  He also endorses wheezing with the cough.  Denies fever, chills, headache, ear pain, ear drainage, abdominal pain, nausea, vomiting, diarrhea, or rash.  Patient's daughter is currently on the phone during this visit, she reports her father has underlying history of myasthenia gravis.  She reports that patient is immunosuppressed.  Patient also endorses chest pain approximately 2 nights ago accompanied by some shortness of breath..  Reports that he does have a history of angina.  States that has since resolved.  So far, states he has been taking over-the-counter medications for his symptoms with minimal relief.  Past Medical History:  Diagnosis Date   Anginal pain    Arthritis    all over   Bladder cancer [facility])    chemo/radiation   Bladder tumor    Chronic lower back pain    Coronary artery disease    a. s/p CABG x 2 (LIMA->LAD, RIMA->RCA);  b. s/p multiple PCI's to Ramus;  c. 08/2011 Cath/PCI: LM 70% into ramus with 70-80% there->treated wtih 3.5x18 Xience Xpedition DES, LCX  nonobs, RCA occluded.  RIMA & LIMA patent, EF 55-65%   DJD (degenerative joint disease)    GERD (gastroesophageal reflux disease)    History of gout    Hyperlipidemia    Hypertension    Hypothyroidism    Neuropathy    OSA on CPAP    Prostate cancer [facility])     Patient Active Problem List   Diagnosis Date Noted   Vasomotor rhinitis 10/30/2023   Chronic left-sided low back pain without sciatica 07/31/2023   Myasthenia gravis (HCC) 07/28/2023   CKD (chronic kidney disease) stage 3, GFR 30-59 ml/min (HCC) 07/28/2023   Bladder  cancer (HCC) 01/29/2022   Autoimmune gastritis 12/14/2021   Iron deficiency anemia 12/14/2021   Barrett's esophagus 01/01/2021   Anemia 01/01/2021   Obstructive sleep apnea on CPAP 05/28/2020   Aortic stenosis 09/24/2011   Coronary artery disease 12/08/2010   Hypothyroidism 09/28/2008   Hyperlipidemia 09/28/2008   Essential hypertension 09/28/2008   Osteoarthritis 09/28/2008    Past Surgical History:  Procedure Laterality Date   ABDOMINAL HERNIA REPAIR     BIOPSY  11/26/2015   Procedure: BIOPSY;  Surgeon: Claudis RAYMOND Rivet, MD;  [facility];  Service: Endoscopy;;  gastric esophagus   BIOPSY  01/20/2021   Procedure: BIOPSY;  Surgeon: Eartha Angelia Sieving, MD;  [facility];  Service: Gastroenterology;;   BIOPSY  10/28/2021   Procedure: BIOPSY;  Surgeon: Eartha Angelia Sieving, MD;  [facility];  Service: Gastroenterology;;   BLADDER TUMOR EXCISION     BROW LIFT Bilateral 04/27/2019   Procedure: BILATERAL BLEPHAROPLASTY;  Surgeon: Harrie Agent, MD;  Location: [facility];  Service: Ophthalmology;  Laterality: Bilateral;   CARDIAC CATHETERIZATION N/A 04/10/2015   Procedure: Left Heart Cath and Cors/Grafts Angiography;  Surgeon: Ozell Fell, MD;  Location: [facility] INVASIVE CV LAB;  Service: Cardiovascular;  Laterality: N/A;   CARDIAC CATHETERIZATION N/A 04/10/2015   Procedure: Coronary Stent Intervention;  Surgeon: Ozell Fell, MD;  Location: [facility] INVASIVE  CV LAB;  Service: Cardiovascular;  Laterality: N/A;   CATARACT EXTRACTION W/PHACO Right 06/23/2015   Procedure: CATARACT EXTRACTION PHACO AND INTRAOCULAR LENS PLACEMENT RIGHT EYE; CDE:  7.08;  Surgeon: Cherene Mania, MD;  Location: [facility];  Service: Ophthalmology;  Laterality: Right;   CATARACT EXTRACTION W/PHACO Left 08/25/2015   Procedure: CATARACT EXTRACTION PHACO AND INTRAOCULAR LENS PLACEMENT LEFT EYE; CDE:  8.18;  Surgeon: Cherene Mania, MD;  Location: [facility];  Service: Ophthalmology;  Laterality:  Left;   COLONOSCOPY N/A 12/28/2012   Procedure: COLONOSCOPY;  Surgeon: Claudis RAYMOND Rivet, MD;  [facility];  Service: Endoscopy;  Laterality: N/A;  830 rescheduled   COLONOSCOPY WITH PROPOFOL  N/A 01/20/2021   Procedure: COLONOSCOPY WITH PROPOFOL ;  Surgeon: Eartha Angelia Sieving, MD;  [facility];  Service: Gastroenterology;  Laterality: N/A;  11:05   CORONARY ANGIOPLASTY WITH STENT PLACEMENT  2013; 04/10/2015   this makes me a total of 7 (04/10/2015)   CORONARY ARTERY BYPASS GRAFT  1997   with (LIMA)   ESOPHAGEAL DILATION N/A 11/26/2015   Procedure: ESOPHAGEAL DILATION;  Surgeon: Claudis RAYMOND Rivet, MD;  [facility];  Service: Endoscopy;  Laterality: N/A;   ESOPHAGEAL DILATION N/A 05/23/2017   Procedure: ESOPHAGEAL DILATION;  Surgeon: Rivet Claudis RAYMOND, MD;  [facility];  Service: Endoscopy;  Laterality: N/A;   ESOPHAGEAL DILATION N/A 06/16/2017   Procedure: ESOPHAGEAL DILATION;  Surgeon: Rivet Claudis RAYMOND, MD;  [facility];  Service: Endoscopy;  Laterality: N/A;   ESOPHAGEAL DILATION  10/28/2021   Procedure: ESOPHAGEAL DILATION;  Surgeon: Eartha Angelia Sieving, MD;  [facility];  Service: Gastroenterology;;   ESOPHAGOGASTRODUODENOSCOPY N/A 11/26/2015   Procedure: ESOPHAGOGASTRODUODENOSCOPY (EGD);  Surgeon: Claudis RAYMOND Rivet, MD;  [facility];  Service: Endoscopy;  Laterality: N/A;  1:25   ESOPHAGOGASTRODUODENOSCOPY N/A 05/23/2017   Procedure: ESOPHAGOGASTRODUODENOSCOPY (EGD);  Surgeon: Rivet Claudis RAYMOND, MD;  [facility];  Service: Endoscopy;  Laterality: N/A;  1:15   ESOPHAGOGASTRODUODENOSCOPY N/A 06/16/2017   Procedure: ESOPHAGOGASTRODUODENOSCOPY (EGD);  Surgeon: Rivet Claudis RAYMOND, MD;  [facility];  Service: Endoscopy;  Laterality: N/A;  1240   ESOPHAGOGASTRODUODENOSCOPY (EGD) WITH PROPOFOL  N/A 01/20/2021   Procedure: ESOPHAGOGASTRODUODENOSCOPY (EGD) WITH PROPOFOL ;  Surgeon: Eartha Angelia Sieving, MD;  [facility];  Service: Gastroenterology;  Laterality: N/A;   ESOPHAGOGASTRODUODENOSCOPY (EGD) WITH PROPOFOL  N/A 10/28/2021   Procedure: ESOPHAGOGASTRODUODENOSCOPY (EGD) WITH PROPOFOL ;  Surgeon: Eartha Angelia Sieving, MD;  [facility];  Service: Gastroenterology;  Laterality: N/A;  215 ASA 2, pt will arrive at 10:45   GANGLION CYST EXCISION Right 01/25/2023   Procedure: REMOVAL GANGLION OF RIGHT WRIST;  Surgeon: Murrell Drivers, MD;  Location: [facility];  Service: Orthopedics;  Laterality: Right;   HERNIA REPAIR Left    IR IMAGING GUIDED PORT INSERTION  02/24/2022   KNEE CARTILAGE SURGERY Bilateral    LEFT HEART CATH AND CORS/GRAFTS ANGIOGRAPHY N/A 09/08/2016   Procedure: LEFT HEART CATH AND CORS/GRAFTS ANGIOGRAPHY;  Surgeon: Jordan, Peter M, MD;  Location[facility] INVASIVE CV LAB;  Service: Cardiovascular;  Laterality: N/A;   LEFT HEART CATHETERIZATION WITH CORONARY ANGIOGRAM N/A 09/08/2011   Procedure: LEFT HEART CATHETERIZATION WITH CORONARY ANGIOGRAM;  Surgeon: Ozell Fell, MD;  Location: [facility] CATH LAB;  Service: Cardiovascular;  Laterality: N/A;   PERCUTANEOUS CORONARY STENT INTERVENTION (PCI-S) Right 09/08/2011   Procedure: PERCUTANEOUS CORONARY STENT INTERVENTION (PCI-S);  Surgeon: Ozell Fell, MD;  Location: [facility] CATH LAB;  Service: Cardiovascular;  Laterality: Right;   POLYPECTOMY  01/20/2021   Procedure: POLYPECTOMY;  Surgeon: Eartha Angelia Sieving, MD;  [facility];  Service: Gastroenterology;;   ROBOT ASSISTED LAPAROSCOPIC RADICAL PROSTATECTOMY         Home Medications    Prior to Admission medications  Medication Sig Start Date End Date Taking? Authorizing Provider  acetaminophen  (TYLENOL ) 500 MG tablet Take 500 mg by mouth every 6 (six) hours as needed for moderate pain (pain score 4-6).    [provider]  amLODipine  (NORVASC ) 10 MG tablet Take 1 tablet (10 mg total) by mouth in the morning.  07/28/23   Cook, Jayce G, DO  Calcium  Carbonate-Vit D-Min (CALCIUM  1200 PO) Take 1,200 mg by mouth daily.    [provider]  carvedilol  (COREG ) 6.25 MG tablet Take 1 tablet (6.25 mg total) by mouth 2 (two) times daily. 12/01/23   Cooper, Michael, MD  cetirizine (ZYRTEC) 10 MG tablet Take 10 mg by mouth every evening.    [provider]  clopidogrel  (PLAVIX ) 75 MG tablet Take 1 tablet (75 mg total) by mouth in the morning. 09/29/23   Cooper, Michael, MD  Coenzyme Q10 300 MG CAPS Take 300 mg by mouth every evening.    [provider]  cyanocobalamin  (VITAMIN B12) 1000 MCG tablet Take 1 tablet (1,000 mcg total) by mouth daily. 06/09/23   Rogers Hai, MD  fenofibrate  160 MG tablet TAKE 1 TABLET(160 MG) BY MOUTH DAILY 01/13/24   Cook, Jayce G, DO  gabapentin  (NEURONTIN ) 600 MG tablet Take 1 tablet (600 mg total) by mouth 3 (three) times daily. 07/28/23   Cook, Jayce G, DO  GLUCOSAMINE-CHONDROITIN PO Take 1 tablet by mouth every evening.    [provider]  ipratropium (ATROVENT ) 0.06 % nasal spray Place 2 sprays into both nostrils 4 (four) times daily as needed for rhinitis. 10/27/23   Cook, Jayce G, DO  isosorbide  mononitrate (IMDUR ) 60 MG 24 hr tablet Take 1.5 tablets (90 mg total) by mouth daily. 12/26/23   Wonda Sharper, MD  levothyroxine  (SYNTHROID ) 150 MCG tablet TAKE 1 TABLET(150 MCG) BY MOUTH DAILY 12/29/23   Cook, Jayce G, DO  meclizine  (ANTIVERT ) 25 MG tablet Take 25 mg by mouth every 6 (six) hours as needed. 05/13/23   [provider]  Multiple Vitamins-Minerals (VITRIUM 50+ ADULT-MULTI PO) Take by mouth.    [provider]  nitroGLYCERIN  (NITROSTAT ) 0.4 MG SL tablet PLACE 1 TABLET UNDER THE TONGUE EVERY 5 MINUTES AS NEEDED FOR CHEST PAIN 06/08/23   Wonda Sharper, MD  Omega-3 Fatty Acids (OMEGA-3 FISH OIL PO) Take by mouth.    [provider]  Polyethyl Glycol-Propyl Glycol (LUBRICANT EYE DROPS) 0.4-0.3 % SOLN Place 1-2 drops  into both eyes 3 (three) times daily as needed (dry/irritated eyes.).    [provider]  predniSONE  (DELTASONE ) 5 MG tablet Take 3 tablets (15 mg total) by mouth daily for 14 days, THEN 2 tablets (10 mg total) daily for 14 days, THEN 1 tablet (5 mg total) daily. 12/29/23 03/26/24  Penumalli, Vikram R, MD  pyridostigmine  (MESTINON ) 60 MG tablet Take 1 tablet (60 mg total) by mouth 3 (three) times daily. 12/29/23   Penumalli, Vikram R, MD  traMADol  (ULTRAM ) 50 MG tablet Take 1 tablet (50 mg total) by mouth every 8 (eight) hours. 12/29/23   Cook, Jayce G, DO  VYVGART HYTRULO 1000-10000 MG-UNT/5ML SOSY Prefilled Syringe  09/14/23   [provider]    Family History  Family History  Problem Relation Age of Onset   Stroke Mother 13       died   Aneurysm Mother    Cancer Father 40       died    Social History Social History[1]   Allergies   Codeine, Gemtesa  [vibegron ], and Pollen extract   Review of Systems Review of Systems Per HPI  Physical Exam Triage Vital Signs ED Triage Vitals  Encounter Vitals Group     BP 01/15/24 1357 (!) 156/75     Girls Systolic BP Percentile --      Girls Diastolic BP Percentile --      Boys Systolic BP Percentile --      Boys Diastolic BP Percentile --      Pulse Rate 01/15/24 1357 60     Resp 01/15/24 1357 16     Temp 01/15/24 1357 97.7 F (36.5 C)     Temp Source 01/15/24 1357 Oral     SpO2 01/15/24 1357 96 %     Weight --      Height --      Head Circumference --      Peak Flow --      Pain Score 01/15/24 1348 0     Pain Loc --      Pain Education --      Exclude from Growth Chart --    No data found.  Updated Vital Signs BP (!) 156/75 (BP Location: Right Arm)   Pulse 60   Temp 97.7 F (36.5 C) (Oral)   Resp 16   SpO2 96%   Visual Acuity Right Eye Distance:   Left Eye Distance:   Bilateral Distance:    Right Eye Near:   Left Eye Near:    Bilateral Near:     Physical Exam Vitals and nursing note reviewed.   Constitutional:      General: He is not in acute distress.    Appearance: Normal appearance.  HENT:     Head: Normocephalic.     Right Ear: Tympanic membrane, ear canal and external ear normal.     Left Ear: Tympanic membrane, ear canal and external ear normal.     Nose: Congestion present.     Right Turbinates: Enlarged and swollen.     Left Turbinates: Enlarged and swollen.     Right Sinus: No maxillary sinus tenderness or frontal sinus tenderness.     Left Sinus: No maxillary sinus tenderness or frontal sinus tenderness.     Mouth/Throat:     Lips: Pink.     Mouth: Mucous membranes are moist.     Pharynx: Postnasal drip present. No pharyngeal swelling, oropharyngeal exudate, posterior oropharyngeal erythema or uvula swelling.     Comments: Cobblestoning present to posterior oropharynx  Eyes:     Extraocular Movements: Extraocular movements intact.     Conjunctiva/sclera: Conjunctivae normal.     Pupils: Pupils are equal, round, and reactive to light.  Cardiovascular:     Rate and Rhythm: Normal rate and regular rhythm.     Pulses: Normal pulses.     Heart sounds: Normal heart sounds.  Pulmonary:     Effort: Pulmonary effort is normal. No respiratory distress.     Breath sounds: Normal breath sounds. No stridor. No wheezing, rhonchi or rales.  Abdominal:     General: Bowel sounds are normal.     Palpations: Abdomen is soft.  Musculoskeletal:     Cervical back: Normal range of motion.  Skin:  General: Skin is warm and dry.  Neurological:     General: No focal deficit present.     Mental Status: He is alert and oriented to person, place, and time.  Psychiatric:        Mood and Affect: Mood normal.        Behavior: Behavior normal.      UC Treatments / Results  Labs (all labs ordered are listed, but only abnormal results are displayed) Labs Reviewed - No data to display  EKG: Sinus bradycardia, no STEMI.  Compared to EKGs dated 03/11/2023, 01/19/2023, and  10/06/2021.   Radiology No results found.  Procedures Procedures (including critical care time)  Medications Ordered in UC Medications - No data to display  Initial Impression / Assessment and Plan / UC Course  I have reviewed the triage vital signs and the nursing notes.  Pertinent labs & imaging results that were available during my care of the patient were reviewed by me and considered in my medical decision making (see chart for details).  Patient presents for complaints of cough, chest congestion, and nasal congestion that been present for the past 10 to 12 days.  On exam, lung sounds are clear throughout, room air sats are at 96%.  He denies fever.  EKG performed sinus bradycardia, no STEMI.  Given the persistence of the underlying cough along with his history of myasthenia gravis with accompanying immunosuppression, we will treat for possible bacterial etiology.  Will avoid fluoroquinolones and macrolides given his underlying history of myasthenia gravis.  Will treat with Augmentin  875/125 mg tablets twice daily for the next 7 days.  Supportive care recommendations were provided and discussed with the patient and his daughter to include fluids, rest, over-the-counter analgesics, use of normal saline nasal spray, and use of a humidifier during sleep.  Recommended follow-up with the patient's primary care physician within the next 7 to 10 days for reevaluation.  Patient and daughter were in agreement with this plan of care and verbalized understanding.  All questions were answered.  Patient stable for discharge.  Final Clinical Impressions(s) / UC Diagnoses   Final diagnoses:  None   Discharge Instructions   None    ED Prescriptions   None    PDMP not reviewed this encounter.    Gilmer Etta PARAS, NP 01/15/24 1421     [1]  Social History Tobacco Use   Smoking status: Former    Types: Cigars    Passive exposure: Past   Smokeless tobacco: Never   Tobacco  comments:    quit smoking in ~ 1978  Vaping Use   Vaping status: Never Used  Substance Use Topics   Alcohol use: Yes    Alcohol/week: 2.0 standard drinks of alcohol    Types: 2 Cans of beer per week    Comment: occasionally    Drug use: No     Gilmer Etta PARAS, NP 01/15/24 1432  "

## 2024-01-16 ENCOUNTER — Encounter: Payer: Self-pay | Admitting: Family Medicine

## 2024-01-17 ENCOUNTER — Other Ambulatory Visit: Payer: Self-pay

## 2024-01-18 ENCOUNTER — Other Ambulatory Visit: Payer: Self-pay | Admitting: Cardiovascular Disease

## 2024-01-25 ENCOUNTER — Ambulatory Visit: Admitting: Urology

## 2024-01-25 VITALS — BP 152/64 | HR 55

## 2024-01-25 DIAGNOSIS — Z8551 Personal history of malignant neoplasm of bladder: Secondary | ICD-10-CM

## 2024-01-25 DIAGNOSIS — Z08 Encounter for follow-up examination after completed treatment for malignant neoplasm: Secondary | ICD-10-CM

## 2024-01-25 DIAGNOSIS — C679 Malignant neoplasm of bladder, unspecified: Secondary | ICD-10-CM

## 2024-01-25 LAB — MICROSCOPIC EXAMINATION: Bacteria, UA: NONE SEEN

## 2024-01-25 LAB — URINALYSIS, ROUTINE W REFLEX MICROSCOPIC
Bilirubin, UA: NEGATIVE
Glucose, UA: NEGATIVE
Ketones, UA: NEGATIVE
Leukocytes,UA: NEGATIVE
Nitrite, UA: NEGATIVE
Specific Gravity, UA: 1.025 (ref 1.005–1.030)
Urobilinogen, Ur: 0.2 mg/dL (ref 0.2–1.0)
pH, UA: 6 (ref 5.0–7.5)

## 2024-01-25 MED ORDER — CIPROFLOXACIN HCL 500 MG PO TABS
500.0000 mg | ORAL_TABLET | Freq: Once | ORAL | Status: AC
  Administered 2024-01-25: 500 mg via ORAL

## 2024-01-25 NOTE — Progress Notes (Signed)
" ° °  01/25/2024  RR:qnoontle bladder cancer   HPI: [name] is a 33uo here for cystoscopy for history of bladder cancer Blood pressure (!) 152/64, pulse (!) 55. NED. A&Ox3.   No respiratory distress   Abd soft, NT, ND Normal phallus with bilateral descended testicles  Cystoscopy Procedure Note  Patient identification was confirmed, informed consent was obtained, and patient was prepped using Betadine  solution.  Lidocaine  jelly was administered per urethral meatus.     Pre-Procedure: - Inspection reveals a normal caliber ureteral meatus.  Procedure: The flexible cystoscope was introduced without difficulty - No urethral strictures/lesions are present. - Enlarged prostate  - Normal bladder neck - Bilateral ureteral orifices identified - Bladder mucosa  reveals no ulcers, tumors, or lesions - No bladder stones - No trabeculation     Post-Procedure: - Patient tolerated the procedure well  Assessment/ Plan: Followup 6 months for cystoscopy  No follow-ups on file.  [name], MD  "

## 2024-01-26 ENCOUNTER — Encounter: Payer: Self-pay | Admitting: Urology

## 2024-01-26 NOTE — Patient Instructions (Signed)

## 2024-01-30 ENCOUNTER — Encounter: Payer: Self-pay | Admitting: Diagnostic Neuroimaging

## 2024-01-31 ENCOUNTER — Ambulatory Visit: Admitting: Diagnostic Neuroimaging

## 2024-01-31 ENCOUNTER — Encounter: Payer: Self-pay | Admitting: Diagnostic Neuroimaging

## 2024-01-31 VITALS — BP 151/72 | HR 59 | Ht 73.0 in | Wt 220.0 lb

## 2024-01-31 DIAGNOSIS — G7000 Myasthenia gravis without (acute) exacerbation: Secondary | ICD-10-CM

## 2024-01-31 NOTE — Patient Instructions (Signed)
 GENERALIZED MYASTHENIA GRAVIS --> FLUCTUATING, FATIGABLE PTOSIS, DOUBLE VISION, DYSARTHRIA, ARM AND LEG WEAKNESS (since ~Jan 2025)  - continue pyridostigmine  60mg  three times a day as tolerated  - continue vyvgart hytrulo injections (4 weeks on, 4 weeks off)  - plan to taper prednisone  (reduce to 10mg  daily x 2 weeks, then 5mg  daily until further notice)  - continue prilosec / protonix   - continue calcium  (1500 mg per day) and vitamin D  (400 to 800 international units per day)

## 2024-01-31 NOTE — Telephone Encounter (Signed)
 MD addressed during OV.

## 2024-01-31 NOTE — Progress Notes (Signed)
 "  [facility]  PATIENT: [name] DOB: 08-Jan-1946  REFERRING CLINICIAN: Cook, Jayce G, DO HISTORY FROM: patient REASON FOR VISIT: follow up   HISTORICAL  CHIEF COMPLAINT:  Chief Complaint  Patient presents with   MG    Rm 7 alone Pt is well and stable, reports no new MG concerns since last visit.  Please see [facility] msg sent yesterday regarding vyvgart     HISTORY OF PRESENT ILLNESS:   UPDATE (01/31/24, VRP): Since last visit, doing well. Symptoms are stable on vyvgart. Eyes are improved. Speech and swallowing mild sxs, but overall better.   UPDATE (12/29/23, VRP): Since last visit, doing better. Now on 3rd cycle of vygart injections (4 weeks on, 4 weeks off). Overall doing better. MG-ADL score today is 5.   UPDATE (08/15/23, VRP): Since last visit, drooping eyelids and double vision better. But having some weight gait, dysphagia, dysarthria, irritability.   UPDATE (05/09/23, VRP): Since last visit, symptoms are stable, but not improved. Still with double vision and drooping vision. Some mild issues with arms and legs.   PRIOR HPI (03/15/23): 79 year old male here for evaluation of double vision and slurred speech.  Patient presented to hospital on 03/11/2023 for several months of intermittent double vision, lip swelling, numb feeling, tongue swelling, gait difficulty, slurred speech, fuzzy thoughts, 30 pound weight loss.  Patient had MRI of the brain which was unremarkable.  CTA of the head and neck showed possible left vertebral artery dissection extending from V1-V3 segment.  Also found to have other bilateral carotid bifurcation stenoses.  He was started on aspirin  and Plavix  and recommended to follow-up in neurology clinic.  Myasthenia gravis antibody testing was sent off but results not back yet.    REVIEW OF SYSTEMS: Full 14 system review of systems performed and negative with exception of: as per HPI.  ALLERGIES: Allergies  Allergen Reactions    Codeine Nausea Only   Gemtesa  [Vibegron ] Other (See Comments)    Patient reports swelling of throat, lips and dizziness when taking this medication   Pollen Extract Other (See Comments)    Seasonal    HOME MEDICATIONS: Outpatient Medications Prior to Visit  Medication Sig Dispense Refill   amLODipine  (NORVASC ) 10 MG tablet Take 1 tablet (10 mg total) by mouth in the morning. 90 tablet 3   amoxicillin -clavulanate (AUGMENTIN ) 875-125 MG tablet Take 1 tablet by mouth every 12 (twelve) hours. 14 tablet 0   Calcium  Carbonate-Vit D-Min (CALCIUM  1200 PO) Take 1,200 mg by mouth daily.     carvedilol  (COREG ) 6.25 MG tablet Take 1 tablet (6.25 mg total) by mouth 2 (two) times daily. 180 tablet 3   cetirizine (ZYRTEC) 10 MG tablet Take 10 mg by mouth every evening.     clopidogrel  (PLAVIX ) 75 MG tablet Take 1 tablet (75 mg total) by mouth in the morning. 30 tablet 1   Coenzyme Q10 300 MG CAPS Take 300 mg by mouth every evening.     cyanocobalamin  (VITAMIN B12) 1000 MCG tablet Take 1 tablet (1,000 mcg total) by mouth daily. 90 tablet 3   fenofibrate  160 MG tablet TAKE 1 TABLET(160 MG) BY MOUTH DAILY 90 tablet 1   gabapentin  (NEURONTIN ) 600 MG tablet Take 1 tablet (600 mg total) by mouth 3 (three) times daily. 270 tablet 3   GLUCOSAMINE-CHONDROITIN PO Take 1 tablet by mouth every evening.     ipratropium (ATROVENT ) 0.06 % nasal spray Place 2 sprays into both nostrils 4 (four) times daily as needed  for rhinitis. 15 mL 0   isosorbide  mononitrate (IMDUR ) 60 MG 24 hr tablet Take 1.5 tablets (90 mg total) by mouth daily. 135 tablet 3   levothyroxine  (SYNTHROID ) 150 MCG tablet TAKE 1 TABLET(150 MCG) BY MOUTH DAILY (Patient taking differently: 175 mcg.) 90 tablet 0   meclizine  (ANTIVERT ) 25 MG tablet Take 25 mg by mouth every 6 (six) hours as needed.     Multiple Vitamins-Minerals (VITRIUM 50+ ADULT-MULTI PO) Take by mouth.     nitroGLYCERIN  (NITROSTAT ) 0.4 MG SL tablet PLACE 1 TABLET UNDER THE TONGUE EVERY 5  MINUTES AS NEEDED FOR CHEST PAIN 25 tablet 0   Omega-3 Fatty Acids (OMEGA-3 FISH OIL PO) Take by mouth.     Polyethyl Glycol-Propyl Glycol (LUBRICANT EYE DROPS) 0.4-0.3 % SOLN Place 1-2 drops into both eyes 3 (three) times daily as needed (dry/irritated eyes.).     predniSONE  (DELTASONE ) 5 MG tablet Take 3 tablets (15 mg total) by mouth daily for 14 days, THEN 2 tablets (10 mg total) daily for 14 days, THEN 1 tablet (5 mg total) daily. 130 tablet 0   pyridostigmine  (MESTINON ) 60 MG tablet Take 1 tablet (60 mg total) by mouth 3 (three) times daily. 270 tablet 4   traMADol  (ULTRAM ) 50 MG tablet Take 1 tablet (50 mg total) by mouth every 8 (eight) hours. 90 tablet 0   VYVGART HYTRULO 1000-10000 MG-UNT/5ML SOSY Prefilled Syringe      acetaminophen  (TYLENOL ) 500 MG tablet Take 500 mg by mouth every 6 (six) hours as needed for moderate pain (pain score 4-6).     No facility-administered medications prior to visit.    PAST MEDICAL HISTORY: Past Medical History:  Diagnosis Date   Anginal pain    Arthritis    all over   Bladder cancer [facility])    chemo/radiation   Bladder tumor    Chronic lower back pain    Coronary artery disease    a. s/p CABG x 2 (LIMA->LAD, RIMA->RCA);  b. s/p multiple PCI's to Ramus;  c. 08/2011 Cath/PCI: LM 70% into ramus with 70-80% there->treated wtih 3.5x18 Xience Xpedition DES, LCX  nonobs, RCA occluded.  RIMA & LIMA patent, EF 55-65%   DJD (degenerative joint disease)    GERD (gastroesophageal reflux disease)    History of gout    Hyperlipidemia    Hypertension    Hypothyroidism    Neuropathy    OSA on CPAP    Prostate cancer (HCC)     PAST SURGICAL HISTORY: Past Surgical History:  Procedure Laterality Date   ABDOMINAL HERNIA REPAIR     BIOPSY  11/26/2015   Procedure: BIOPSY;  Surgeon: Claudis RAYMOND Rivet, MD;  [facility];  Service: Endoscopy;;  gastric esophagus   BIOPSY  01/20/2021   Procedure: BIOPSY;  Surgeon: Eartha Angelia Sieving, MD;   [facility];  Service: Gastroenterology;;   BIOPSY  10/28/2021   Procedure: BIOPSY;  Surgeon: Eartha Angelia Sieving, MD;  [facility];  Service: Gastroenterology;;   BLADDER TUMOR EXCISION     BROW LIFT Bilateral 04/27/2019   Procedure: BILATERAL BLEPHAROPLASTY;  Surgeon: Harrie Agent, MD;  [facility]: [facility];  Service: Ophthalmology;  Laterality: Bilateral;   CARDIAC CATHETERIZATION N/A 04/10/2015   Procedure: Left Heart Cath and Cors/Grafts Angiography;  Surgeon: Ozell Fell, MD;  Location: [facility] INVASIVE CV LAB;  Service: Cardiovascular;  Laterality: N/A;   CARDIAC CATHETERIZATION N/A 04/10/2015   Procedure: Coronary Stent Intervention;  Surgeon: Ozell Fell, MD;  Location: [facility]  INVASIVE CV LAB;  Service: Cardiovascular;  Laterality: N/A;   CATARACT EXTRACTION W/PHACO Right 06/23/2015   Procedure: CATARACT EXTRACTION PHACO AND INTRAOCULAR LENS PLACEMENT RIGHT EYE; CDE:  7.08;  Surgeon: Cherene Mania, MD;  [facility]: [facility];  Service: Ophthalmology;  Laterality: Right;   CATARACT EXTRACTION W/PHACO Left 08/25/2015   Procedure: CATARACT EXTRACTION PHACO AND INTRAOCULAR LENS PLACEMENT LEFT EYE; CDE:  8.18;  Surgeon: Cherene Mania, MD;  Location: [facility];  Service: Ophthalmology;  Laterality: Left;   COLONOSCOPY N/A 12/28/2012   Procedure: COLONOSCOPY;  Surgeon: Claudis RAYMOND Rivet, MD;  [facility];  Service: Endoscopy;  Laterality: N/A;  830 rescheduled   COLONOSCOPY WITH PROPOFOL  N/A 01/20/2021   Procedure: COLONOSCOPY WITH PROPOFOL ;  Surgeon: Eartha Angelia Sieving, MD;  [facility];  Service: Gastroenterology;  Laterality: N/A;  11:05   CORONARY ANGIOPLASTY WITH STENT PLACEMENT  2013; 04/10/2015   this makes me a total of 7 (04/10/2015)   CORONARY ARTERY BYPASS GRAFT  1997   with (LIMA)   ESOPHAGEAL DILATION N/A 11/26/2015   Procedure: ESOPHAGEAL DILATION;  Surgeon: Claudis RAYMOND Rivet, MD;  [facility];  Service: Endoscopy;  Laterality:  N/A;   ESOPHAGEAL DILATION N/A 05/23/2017   Procedure: ESOPHAGEAL DILATION;  Surgeon: Rivet Claudis RAYMOND, MD;  [facility];  Service: Endoscopy;  Laterality: N/A;   ESOPHAGEAL DILATION N/A 06/16/2017   Procedure: ESOPHAGEAL DILATION;  Surgeon: Rivet Claudis RAYMOND, MD;  [facility];  Service: Endoscopy;  Laterality: N/A;   ESOPHAGEAL DILATION  10/28/2021   Procedure: ESOPHAGEAL DILATION;  Surgeon: Eartha Angelia Sieving, MD;  [facility];  Service: Gastroenterology;;   ESOPHAGOGASTRODUODENOSCOPY N/A 11/26/2015   Procedure: ESOPHAGOGASTRODUODENOSCOPY (EGD);  Surgeon: Claudis RAYMOND Rivet, MD;  [facility];  Service: Endoscopy;  Laterality: N/A;  1:25   ESOPHAGOGASTRODUODENOSCOPY N/A 05/23/2017   Procedure: ESOPHAGOGASTRODUODENOSCOPY (EGD);  Surgeon: Rivet Claudis RAYMOND, MD;  [facility];  Service: Endoscopy;  Laterality: N/A;  1:15   ESOPHAGOGASTRODUODENOSCOPY N/A 06/16/2017   Procedure: ESOPHAGOGASTRODUODENOSCOPY (EGD);  Surgeon: Rivet Claudis RAYMOND, MD;  [facility];  Service: Endoscopy;  Laterality: N/A;  1240   ESOPHAGOGASTRODUODENOSCOPY (EGD) WITH PROPOFOL  N/A 01/20/2021   Procedure: ESOPHAGOGASTRODUODENOSCOPY (EGD) WITH PROPOFOL ;  Surgeon: Eartha Angelia Sieving, MD;  [facility];  Service: Gastroenterology;  Laterality: N/A;   ESOPHAGOGASTRODUODENOSCOPY (EGD) WITH PROPOFOL  N/A 10/28/2021   Procedure: ESOPHAGOGASTRODUODENOSCOPY (EGD) WITH PROPOFOL ;  Surgeon: Eartha Angelia Sieving, MD;  [facility];  Service: Gastroenterology;  Laterality: N/A;  215 ASA 2, pt will arrive at 10:45   GANGLION CYST EXCISION Right 01/25/2023   Procedure: REMOVAL GANGLION OF RIGHT WRIST;  Surgeon: Murrell Drivers, MD;  Location: [facility];  Service: Orthopedics;  Laterality: Right;   HERNIA REPAIR Left    IR IMAGING GUIDED PORT INSERTION  02/24/2022   KNEE CARTILAGE SURGERY Bilateral    LEFT HEART CATH AND  CORS/GRAFTS ANGIOGRAPHY N/A 09/08/2016   Procedure: LEFT HEART CATH AND CORS/GRAFTS ANGIOGRAPHY;  Surgeon: Jordan, Peter M, MD;  Location[facility] INVASIVE CV LAB;  Service: Cardiovascular;  Laterality: N/A;   LEFT HEART CATHETERIZATION WITH CORONARY ANGIOGRAM N/A 09/08/2011   Procedure: LEFT HEART CATHETERIZATION WITH CORONARY ANGIOGRAM;  Surgeon: Ozell Fell, MD;  Location: [facility] CATH LAB;  Service: Cardiovascular;  Laterality: N/A;   PERCUTANEOUS CORONARY STENT INTERVENTION (PCI-S) Right 09/08/2011   Procedure: PERCUTANEOUS CORONARY STENT INTERVENTION (PCI-S);  Surgeon: Ozell Fell, MD;  Location: [facility] CATH LAB;  Service: Cardiovascular;  Laterality: Right;   POLYPECTOMY  01/20/2021   Procedure: POLYPECTOMY;  Surgeon: Eartha Angelia Sieving, MD;  [facility];  Service: Gastroenterology;;   ROBOT ASSISTED LAPAROSCOPIC RADICAL PROSTATECTOMY      FAMILY HISTORY: Family History  Problem Relation Age of Onset   Stroke Mother 12       died   Aneurysm Mother    Cancer Father 58       died    SOCIAL HISTORY: Social History   Socioeconomic History   Marital status: Widowed    Spouse name: Not on file   Number of children: Not on file   Years of education: Not on file   Highest education level: Not on file  Occupational History   Occupation: semi-retired  Tobacco Use   Smoking status: Former    Types: Cigars    Passive exposure: Past   Smokeless tobacco: Never   Tobacco comments:    quit smoking in ~ 1978  Vaping Use   Vaping status: Never Used  Substance and Sexual Activity   Alcohol use: Yes    Alcohol/week: 2.0 standard drinks of alcohol    Types: 2 Cans of beer per week    Comment: occasionally    Drug use: No   Sexual activity: Not Currently  Other Topics Concern   Not on file  Social History Narrative   Pt lives alone    Manages rental property    Social Drivers of Health   Tobacco Use: Medium Risk (01/31/2024)   Patient History    Smoking Tobacco  Use: Former    Smokeless Tobacco Use: Never    Passive Exposure: Past  Physicist, Medical Strain: Low Risk (08/10/2022)   Overall Financial Resource Strain (CARDIA)    Difficulty of Paying Living Expenses: Not hard at all  Food Insecurity: No Food Insecurity (08/29/2023)   Epic    Worried About Programme Researcher, Broadcasting/film/video in the Last Year: Never true    Ran Out of Food in the Last Year: Never true  Transportation Needs: No Transportation Needs (08/29/2023)   Epic    Lack of Transportation (Medical): No    Lack of Transportation (Non-Medical): No  Physical Activity: Sufficiently Active (06/04/2022)   Exercise Vital Sign    Days of Exercise per Week: 5 days    Minutes of Exercise per Session: 30 min  Stress: Not on file  Social Connections: Not on file  Intimate Partner Violence: Not At Risk (08/29/2023)   Epic    Fear of Current or Ex-Partner: No    Emotionally Abused: No    Physically Abused: No    Sexually Abused: No  Depression (PHQ2-9): Low Risk (08/29/2023)   Depression (PHQ2-9)    PHQ-2 Score: 0  Alcohol Screen: Not on file  Housing: Low Risk (08/29/2023)   Epic    Unable to Pay for Housing in the Last Year: No    Number of Times Moved in the Last Year: 0    Homeless in the Last Year: No  Utilities: Not At Risk (08/29/2023)   Epic    Threatened with loss of utilities: No  Health Literacy: Not on file     PHYSICAL EXAM  GENERAL EXAM/CONSTITUTIONAL: Vitals:  Vitals:   01/31/24 1527 01/31/24 1530  BP: (!) 151/69 (!) 151/72  Pulse: (!) 59 (!) 59  Weight: 220 lb (99.8 kg)   Height: 6' 1 (1.854 m)     Body mass index is 29.03 kg/m. Wt  Readings from Last 3 Encounters:  01/31/24 220 lb (99.8 kg)  11/11/23 222 lb (100.7 kg)  10/27/23 213 lb 8 oz (96.8 kg)   Patient is in no distress; well developed, nourished and groomed; neck is supple  CARDIOVASCULAR: Examination of carotid arteries is normal; no carotid bruits Regular rate and rhythm, no murmurs Examination of  peripheral vascular system by observation and palpation is normal  EYES: Ophthalmoscopic exam of optic discs and posterior segments is normal; no papilledema or hemorrhages No results found.   MUSCULOSKELETAL: Gait, strength, tone, movements noted in Neurologic exam below  NEUROLOGIC: MENTAL STATUS:      No data to display         awake, alert, oriented to person, place and time recent and remote memory intact normal attention and concentration language fluent, comprehension intact, naming intact fund of knowledge appropriate  CRANIAL NERVE:  2nd - no papilledema on fundoscopic exam 2nd, 3rd, 4th, 6th - pupils equal and reactive to light, visual fields full to confrontation, extraocular muscles intact, no nystagmus 5th - facial sensation symmetric 7th - facial strength symmetric 8th - hearing intact 9th - palate elevates symmetrically, uvula midline 11th - shoulder shrug symmetric 12th - tongue protrusion midline NASAL SPEECH; MILD DYSARTHRIA  MOTOR:  normal bulk and tone, full strength in the BUE, BLE  SENSORY:  normal and symmetric to light touch  COORDINATION:  finger-nose-finger, fine finger movements normal  REFLEXES:  deep tendon reflexes TRACE and symmetric  GAIT/STATION:  narrow based gait     DIAGNOSTIC DATA (LABS, IMAGING, TESTING) - I reviewed patient records, labs, notes, testing and imaging myself where available.  Lab Results  Component Value Date   WBC 6.4 08/18/2023   HGB 10.1 (L) 08/18/2023   HCT 32.2 (L) 08/18/2023   MCV 109.2 (H) 08/18/2023   PLT 152 08/18/2023      Component Value Date/Time   NA 140 08/18/2023 0914   NA 139 07/28/2023 1512   K 3.6 08/18/2023 0914   CL 102 08/18/2023 0914   CO2 27 08/18/2023 0914   GLUCOSE 119 (H) 08/18/2023 0914   BUN 26 (H) 08/18/2023 0914   BUN 39 (H) 07/28/2023 1512   CREATININE 1.26 (H) 08/18/2023 0914   CREATININE 1.69 (H) 12/14/2021 1124   CALCIUM  9.1 08/18/2023 0914   PROT 6.4 (L)  08/18/2023 0914   PROT 6.2 07/28/2023 1512   ALBUMIN 3.6 08/18/2023 0914   ALBUMIN 4.2 07/28/2023 1512   AST 18 08/18/2023 0914   ALT 22 08/18/2023 0914   ALKPHOS 34 (L) 08/18/2023 0914   BILITOT 0.6 08/18/2023 0914   BILITOT 0.3 07/28/2023 1512   GFRNONAA 58 (L) 08/18/2023 0914   GFRAA >60 09/08/2016 1956   Lab Results  Component Value Date   CHOL 150 07/28/2023   HDL 57 07/28/2023   LDLCALC 70 07/28/2023   TRIG 130 07/28/2023   CHOLHDL 2.6 07/28/2023   Lab Results  Component Value Date   HGBA1C 6.4 (H) 07/28/2023   Lab Results  Component Value Date   VITAMINB12 189 02/17/2023   Lab Results  Component Value Date   TSH 0.056 (L) 07/28/2023      Component Ref Range & Units (hover) 03/15/23   AChR Binding Ab, Serum 70.10 High       03/11/23 CTA head / neck 1. Abnormal soft tissue along the wall of the V1 segment left vertebral artery with multifocal irregularity of the vessel from the V1 segment to the V3 segment. Focal  occlusion of the distal V3 segment just prior to entering the dura. Findings concerning for vertebral artery dissection. 2. Severe (70% or greater) stenosis of the right carotid bifurcation. 3. Moderate (50-69%) stenosis of the left carotid bifurcation. 4. Moderate stenosis of the cavernous and ophthalmic right ICA. 5. No intracranial large vessel occlusion. 6. No acute intracranial abnormality. Mild chronic microvascular ischemic changes. 7. Prominent anterior endplate osteophytes in the upper thoracic spine which likely compress the esophagus. Associated patulous appearance of the cervical esophagus above this level.  08/18/23 CTA head  1. Multifocal areas of left vertebral artery stenosis are unchanged from the prior study from March 11, 2023 2. Bilateral calcific carotid plaque. The degree of stenosis on the right is difficult to measure due to the calcium . Less than 50% on the left 3. Stenosis of the left vertebral artery intracranially  just proximal to the left PICA unchanged. Otherwise no significant intracranial disease    ASSESSMENT AND PLAN  79 y.o. year old male here with:  Dx:  1. Myasthenia gravis (HCC)     PLAN:  GENERALIZED MYASTHENIA GRAVIS --> FLUCTUATING, FATIGABLE PTOSIS, DOUBLE VISION, DYSARTHRIA, ARM AND LEG WEAKNESS (since ~Jan 2025)  - signs and symptoms consistent with myasthenia gravis (AchR ab positive)  - MG-ADL score 5 (12/26/23)  - continue pyridostigmine  60mg  three times a day as tolerated  - continue vyvgart hytrulo injections (4 weeks on, 4 weeks off)  - plan to taper prednisone  (reduce to 10mg  daily x 2 weeks, then 5mg  daily until further notice)  - continue prilosec / protonix  OTC  - continue calcium  (1500 mg per day) and vitamin D  (400 to 800 international units per day)    LEFT VERTEBRAL ARTERY DISSECTION (appears to be incidental finding as fluctuating fatigable symptoms not consistent with this localization) - completed aspirin  79 + plavix  75mg  daily x 6 months; now on clopidogrel  75mg  daily alone - continue medical mgmt for intracranial atherosclerosis (BP, lipid control)  Return in about 4 months (around 05/30/2024).      Steve FABIENE HANLON, MD 01/31/2024, 3:50 PM Certified in Neurology, Neurophysiology and Neuroimaging  [facility] [address], [address] [place], [place] [address] [phone]  "

## 2024-02-07 ENCOUNTER — Encounter: Payer: Self-pay | Admitting: Family Medicine

## 2024-02-10 ENCOUNTER — Encounter: Payer: Self-pay | Admitting: Family Medicine

## 2024-02-10 ENCOUNTER — Other Ambulatory Visit: Payer: Self-pay | Admitting: Family Medicine

## 2024-02-10 DIAGNOSIS — G8929 Other chronic pain: Secondary | ICD-10-CM

## 2024-02-13 ENCOUNTER — Ambulatory Visit: Admitting: Family Medicine

## 2024-02-16 ENCOUNTER — Telehealth: Payer: Self-pay | Admitting: Neurology

## 2024-02-16 ENCOUNTER — Telehealth: Payer: Self-pay | Admitting: Diagnostic Neuroimaging

## 2024-02-16 ENCOUNTER — Ambulatory Visit: Admitting: Family Medicine

## 2024-02-16 DIAGNOSIS — I10 Essential (primary) hypertension: Secondary | ICD-10-CM

## 2024-02-16 DIAGNOSIS — M545 Low back pain, unspecified: Secondary | ICD-10-CM

## 2024-02-16 MED ORDER — TRAMADOL HCL 50 MG PO TABS: 50.0000 mg | ORAL_TABLET | Freq: Three times a day (TID) | ORAL | 1 refills | Status: AC | PRN

## 2024-02-16 MED ORDER — TRAMADOL HCL 50 MG PO TABS: 50.0000 mg | ORAL_TABLET | Freq: Three times a day (TID) | ORAL | 1 refills | Status: DC | PRN

## 2024-02-16 MED ORDER — AMLODIPINE BESYLATE 5 MG PO TABS: 5.0000 mg | ORAL_TABLET | Freq: Every morning | ORAL | 1 refills | Status: AC

## 2024-02-16 NOTE — Patient Instructions (Signed)
 Elevate legs.  Amlodipine  decreased.  Future Rx for tramadol  sent in.

## 2024-02-16 NOTE — Telephone Encounter (Signed)
 Pt daughter Rocky called  , Pt daughter wants to know if she can give Pt injection tonight due to Pt is 79 years old Pt is due to injection  for tomorrow but Pt daughter wanted to help him with injection .Is this okay?      VYVGART HYTRULO 1000-10000 MG-UNT/5ML SOSY Prefilled Syringe

## 2024-02-16 NOTE — Telephone Encounter (Signed)
 Daughter called the answering service asking if she can give the Vyvgard injection tonight instead tomorrow morning at 10 because she will not be in town to help father.  Recommended daughter to go ahead and give the injection tonight. Will inform Dr. Margaret  Dr. Gregg

## 2024-02-22 ENCOUNTER — Inpatient Hospital Stay

## 2024-02-22 ENCOUNTER — Ambulatory Visit (HOSPITAL_COMMUNITY)

## 2024-02-29 ENCOUNTER — Inpatient Hospital Stay: Admitting: Oncology

## 2024-04-26 ENCOUNTER — Ambulatory Visit: Admitting: Family Medicine

## 2024-06-06 ENCOUNTER — Ambulatory Visit: Admitting: Diagnostic Neuroimaging

## 2024-07-25 ENCOUNTER — Other Ambulatory Visit: Admitting: Urology

## 2024-11-05 ENCOUNTER — Ambulatory Visit (HOSPITAL_COMMUNITY)
